# Patient Record
Sex: Female | Born: 1950 | Race: White | Hispanic: No | Marital: Single | Smoking: Never smoker
Health system: Southern US, Community
[De-identification: ages and names within clinical notes are randomized; demographics above are authoritative.]

## PROBLEM LIST (undated history)

## (undated) DIAGNOSIS — G9341 Metabolic encephalopathy: Secondary | ICD-10-CM

## (undated) DIAGNOSIS — I10 Essential (primary) hypertension: Secondary | ICD-10-CM

## (undated) DIAGNOSIS — E785 Hyperlipidemia, unspecified: Secondary | ICD-10-CM

## (undated) DIAGNOSIS — I499 Cardiac arrhythmia, unspecified: Secondary | ICD-10-CM

## (undated) DIAGNOSIS — I219 Acute myocardial infarction, unspecified: Secondary | ICD-10-CM

## (undated) DIAGNOSIS — I5030 Unspecified diastolic (congestive) heart failure: Secondary | ICD-10-CM

## (undated) DIAGNOSIS — R6520 Severe sepsis without septic shock: Secondary | ICD-10-CM

## (undated) DIAGNOSIS — N189 Chronic kidney disease, unspecified: Secondary | ICD-10-CM

## (undated) DIAGNOSIS — Z87442 Personal history of urinary calculi: Secondary | ICD-10-CM

## (undated) DIAGNOSIS — R1310 Dysphagia, unspecified: Secondary | ICD-10-CM

## (undated) DIAGNOSIS — T07XXXA Unspecified multiple injuries, initial encounter: Secondary | ICD-10-CM

## (undated) DIAGNOSIS — I890 Lymphedema, not elsewhere classified: Secondary | ICD-10-CM

## (undated) DIAGNOSIS — E119 Type 2 diabetes mellitus without complications: Secondary | ICD-10-CM

## (undated) DIAGNOSIS — R7881 Bacteremia: Secondary | ICD-10-CM

## (undated) DIAGNOSIS — N179 Acute kidney failure, unspecified: Secondary | ICD-10-CM

## (undated) DIAGNOSIS — G4733 Obstructive sleep apnea (adult) (pediatric): Secondary | ICD-10-CM

## (undated) DIAGNOSIS — A4151 Sepsis due to Escherichia coli [E. coli]: Secondary | ICD-10-CM

## (undated) DIAGNOSIS — I2510 Atherosclerotic heart disease of native coronary artery without angina pectoris: Secondary | ICD-10-CM

## (undated) DIAGNOSIS — Z950 Presence of cardiac pacemaker: Secondary | ICD-10-CM

## (undated) DIAGNOSIS — J9601 Acute respiratory failure with hypoxia: Secondary | ICD-10-CM

## (undated) DIAGNOSIS — I350 Nonrheumatic aortic (valve) stenosis: Secondary | ICD-10-CM

## (undated) HISTORY — DX: Bacteremia: R78.81

## (undated) HISTORY — DX: Lymphedema, not elsewhere classified: I89.0

## (undated) HISTORY — DX: Acute myocardial infarction, unspecified: I21.9

## (undated) HISTORY — PX: PACEMAKER INSERTION: SHX728

## (undated) HISTORY — PX: OTHER SURGICAL HISTORY: SHX169

## (undated) HISTORY — PX: CARDIAC SURGERY: SHX584

---

## 2014-05-29 ENCOUNTER — Encounter: Payer: Self-pay | Admitting: Surgery

## 2014-05-29 ENCOUNTER — Encounter: Payer: Self-pay | Admitting: General Surgery

## 2015-07-13 ENCOUNTER — Encounter: Payer: Medicaid Other | Attending: Surgery | Admitting: Surgery

## 2015-07-13 DIAGNOSIS — Z7982 Long term (current) use of aspirin: Secondary | ICD-10-CM | POA: Diagnosis not present

## 2015-07-13 DIAGNOSIS — Z79899 Other long term (current) drug therapy: Secondary | ICD-10-CM | POA: Diagnosis not present

## 2015-07-13 DIAGNOSIS — Z6841 Body Mass Index (BMI) 40.0 and over, adult: Secondary | ICD-10-CM | POA: Diagnosis not present

## 2015-07-13 DIAGNOSIS — Z794 Long term (current) use of insulin: Secondary | ICD-10-CM | POA: Diagnosis not present

## 2015-07-13 DIAGNOSIS — I350 Nonrheumatic aortic (valve) stenosis: Secondary | ICD-10-CM | POA: Insufficient documentation

## 2015-07-13 DIAGNOSIS — E11620 Type 2 diabetes mellitus with diabetic dermatitis: Secondary | ICD-10-CM | POA: Insufficient documentation

## 2015-07-13 DIAGNOSIS — H5500 Unspecified nystagmus: Secondary | ICD-10-CM | POA: Insufficient documentation

## 2015-07-13 DIAGNOSIS — I10 Essential (primary) hypertension: Secondary | ICD-10-CM | POA: Diagnosis not present

## 2015-07-13 DIAGNOSIS — E785 Hyperlipidemia, unspecified: Secondary | ICD-10-CM | POA: Insufficient documentation

## 2015-07-13 DIAGNOSIS — E11628 Type 2 diabetes mellitus with other skin complications: Secondary | ICD-10-CM | POA: Insufficient documentation

## 2015-07-14 NOTE — Progress Notes (Signed)
ZYKIA, SUTKOWSKI ([id number]) Visit Report for 07/13/2015 Abuse/Suicide Risk Screen Details Patient Name: [name], [name]. Date of Service: 07/13/2015 8:45 AM Medical Record Patient Account Number: [id number] Number: [name], RN, BSN, Treating RN: August 12, 1950 (65 y.o. Velva Harman Date of Birth/Sex: Female) Other Clinician: Primary Care Physician: Benny Lennert Treating Britto, Errol Referring Physician: [name] Physician/Extender: Suella Grove in Treatment: 0 Abuse/Suicide Risk Screen Items Answer ABUSE/SUICIDE RISK SCREEN: Has anyone close to you tried to hurt or harm you recentlyo No Do you feel uncomfortable with anyone in your familyo No Has anyone forced you do things that you didnot want to doo No Do you have any thoughts of harming yourselfo No Patient displays signs or symptoms of abuse and/or neglect. No Electronic Signature(s) Signed: 07/13/2015 5:00:07 PM By: Regan Lemming BSN, RN Entered By: Regan Lemming on 07/13/2015 09:02:51 [name] ([id number]) -------------------------------------------------------------------------------- Activities of Daily Living Details Patient Name: [name], [name]. Date of Service: 07/13/2015 8:45 AM Medical Record Patient Account Number: [id number] Number: [name], RN, BSN, Treating RN: November 25, 1950 (65 y.o. Velva Harman Date of Birth/Sex: Female) Other Clinician: Primary Care Physician: Benny Lennert Treating Christin Fudge Referring Physician: [name] Physician/Extender: Suella Grove in Treatment: 0 Activities of Daily Living Items Answer Activities of Daily Living (Please select one for each item) Drive Automobile Not Able Take Medications Completely Able Use Telephone Completely Able Care for Appearance Completely Able Use Toilet Completely Able Bath / Shower Completely Able Dress Self Completely Able Feed Self Completely Able Walk Completely Able Get In / Out Bed Completely Able Housework Completely Able Prepare Meals  Completely Able Handle Money Completely Able Shop for Self Completely Able Electronic Signature(s) Signed: 07/13/2015 5:00:07 PM By: Regan Lemming BSN, RN Entered By: Regan Lemming on 07/13/2015 09:02:39 [name] ([id number]) -------------------------------------------------------------------------------- Education Assessment Details Patient Name: [name]. Date of Service: 07/13/2015 8:45 AM Medical Record Patient Account Number: [id number] Number: [name], RN, BSN, Treating RN: [birth year] (65 y.o. Velva Harman Date of Birth/Sex: Female) Other Clinician: Primary Care Physician: Benny Lennert Treating Christin Fudge Referring Physician: [name] Physician/Extender: Suella Grove in Treatment: 0 Primary Learner Assessed: Patient Learning Preferences/Education Level/Primary Language Learning Preference: Explanation Highest Education Level: College or Above Preferred Language: English Cognitive Barrier Assessment/Beliefs Language Barrier: No Physical Barrier Assessment Impaired Vision: Yes Glasses Impaired Hearing: No Decreased Hand dexterity: No Knowledge/Comprehension Assessment Knowledge Level: Medium Comprehension Level: Medium Ability to understand written Medium instructions: Ability to understand verbal Medium instructions: Motivation Assessment Anxiety Level: Calm Cooperation: Cooperative Education Importance: Acknowledges Need Interest in Health Problems: Asks Questions Perception: Coherent Willingness to Engage in Self- Medium Management Activities: Readiness to Engage in Self- Medium Management Activities: Electronic Signature(s) Signed: 07/13/2015 5:00:07 PM By: Regan Lemming BSN, RN Entered By: Regan Lemming on 07/13/2015 09:01:38 [name] ([id number] [name] ([id number]) -------------------------------------------------------------------------------- Fall Risk Assessment Details Patient Name: [name]. Date of Service:  07/13/2015 8:45 AM Medical Record Patient Account Number: [id number] Number: [name], RN, BSN, Treating RN: January 31, 1951 (65 y.o. Velva Harman Date of Birth/Sex: Female) Other Clinician: Primary Care Physician: Benny Lennert Treating Britto, Errol Referring Physician: [name] Physician/Extender: Suella Grove in Treatment: 0 Fall Risk Assessment Items Have you had 2 or more falls in the last 12 monthso 0 No Have you had any fall that resulted in injury in the last 12 monthso 0 No FALL RISK ASSESSMENT: History of falling - immediate or within 3 months 0 No Secondary diagnosis 0 No Ambulatory aid None/bed rest/wheelchair/nurse 0 Yes Crutches/cane/walker 0 No Furniture 0  No IV Access/Saline Lock 0 No Gait/Training Normal/bed rest/immobile 0 Yes Weak 0 No Impaired 0 No Mental Status Oriented to own ability 0 Yes Electronic Signature(s) Signed: 07/13/2015 5:00:07 PM By: Regan Lemming BSN, RN Entered By: Regan Lemming on 07/13/2015 09:01:53 [name] ([id number]) -------------------------------------------------------------------------------- Foot Assessment Details Patient Name: [name]. Date of Service: 07/13/2015 8:45 AM Medical Record Patient Account Number: [id number] Number: [name], RN, BSN, Treating RN: January 04, 1951 (65 y.o. Velva Harman Date of Birth/Sex: Female) Other Clinician: Primary Care Physician: Benny Lennert Treating Britto, Errol Referring Physician: [name] Physician/Extender: Suella Grove in Treatment: 0 Foot Assessment Items Site Locations + = Sensation present, - = Sensation absent, C = Callus, U = Ulcer R = Redness, W = Warmth, M = Maceration, PU = Pre-ulcerative lesion F = Fissure, S = Swelling, D = Dryness Assessment Right: Left: Other Deformity: No No Prior Foot Ulcer: No No Prior Amputation: No No Charcot Joint: No No Ambulatory Status: Ambulatory With Help Assistance Device: Cane Gait: Administrator, arts) Signed: 07/13/2015  5:00:07 PM By: Regan Lemming BSN, RN Entered By: Regan Lemming on 07/13/2015 09:02:11 [name] ([id number] [name] ([id number]) -------------------------------------------------------------------------------- Nutrition Risk Assessment Details Patient Name: [name]. Date of Service: 07/13/2015 8:45 AM Medical Record Patient Account Number: [id number] Number: [name], RN, BSN, Treating RN: February 10, 1951 (65 y.o. Velva Harman Date of Birth/Sex: Female) Other Clinician: Primary Care Physician: Benny Lennert Treating Britto, Errol Referring Physician: [name] Physician/Extender: Suella Grove in Treatment: 0 Height (in): 63 Weight (lbs): 320 Body Mass Index (BMI): 56.7 Nutrition Risk Assessment Items NUTRITION RISK SCREEN: I have an illness or condition that made me change the kind and/or 0 No amount of food I eat I eat fewer than two meals per day 0 No I eat few fruits and vegetables, or milk products 0 No I have three or more drinks of beer, liquor or wine almost every day 0 No I have tooth or mouth problems that make it hard for me to eat 0 No I don't always have enough money to buy the food I need 0 No I eat alone most of the time 0 No I take three or more different prescribed or over-the-counter drugs a 0 No day Without wanting to, I have lost or gained 10 pounds in the last six 0 No months I am not always physically able to shop, cook and/or feed myself 0 No Nutrition Protocols Good Risk Protocol 0 No interventions needed Moderate Risk Protocol Electronic Signature(s) Signed: 07/13/2015 5:00:07 PM By: Regan Lemming BSN, RN Entered By: Regan Lemming on 07/13/2015 09:02:01

## 2015-07-14 NOTE — Progress Notes (Signed)
[name], [name] ([id number]) Visit Report for 07/13/2015 Chief Complaint Document Details Patient Name: [name], [name]. Date of Service: 07/13/2015 8:45 AM Medical Record Patient Account Number: [id number] Number: [name], Treating [name]: July 20, 1950 (65 y.o. [name] Date of Birth/Sex: Female) Other Clinician: Primary Care Physician: [name] Treating [name] Referring Physician: Gordy [name] Physician/Extender: [name] in Treatment: 0 Information Obtained from: Patient Chief Complaint Patients presents for treatment of an open diabetic ulcer to her left lower extremity which she's had for about 3 months. Electronic Signature(s) Signed: 07/13/2015 9:48:56 AM By: Miranda Fudge MD, FACS Entered By: [name] on 07/13/2015 09:48:56 [name] ([id number]) -------------------------------------------------------------------------------- HPI Details Patient Name: [name]. Date of Service: 07/13/2015 8:45 AM Medical Record Patient Account Number: [id number] Number: [name], Treating [name]: Oct 26, 1950 (65 y.o. [name] Date of Birth/Sex: Female) Other Clinician: Primary Care Physician: [name] Treating [name] Referring Physician: Gordy [name] Physician/Extender: [name] in Treatment: 0 History of Present Illness Location: left leg on the lower lateral side Quality: Patient reports No Pain. Severity: Patient states wound (s) are getting better. Duration: Patient has had the wound for > 3 months prior to seeking treatment at the wound center Modifying Factors: diabetes,obesity Associated Signs and Symptoms: Very dry skin legs HPI Description: Pleasant 65 year old with history of diabetes (hemoglobin A1c 6.8), aortic stenosis, obesity, and lower extremity swelling. She has had recurrent problems to the left lower extremity where she's had dry scabs and was concerned about ulceration. In the past she was seen for left calf  ulcerations since April 2015, most recently in October 2015. last seen here in December 2015, when she was prescribed juxta light compression wraps and asked to use them on a regular basis. However she has problems bending down and cannot reach her lower leg and has been unable to apply compression stockings. she has never been a smoker. Her other comorbidities include hypertension, hyperlipidemia, obesity, aortic valve stenosis and nystagmus. Electronic Signature(s) Signed: 07/13/2015 9:52:15 AM By: Miranda Fudge MD, FACS Entered By: [name] on 07/13/2015 09:52:15 [name] ([id number]) -------------------------------------------------------------------------------- Physical Exam Details Patient Name: [name]. Date of Service: 07/13/2015 8:45 AM Medical Record Patient Account Number: [id number] Number: [name], Treating [name]: 1951-03-19 (65 y.o. [name] Date of Birth/Sex: Female) Other Clinician: Primary Care Physician: [name] Treating [name] Referring Physician: Gordy [name] Physician/Extender: Weeks in Treatment: 0 Constitutional . Pulse regular. Respirations normal and unlabored. Afebrile. . Eyes Nonicteric. Reactive to light. Ears, Nose, Mouth, and Throat Lips, teeth, and gums WNL.Marland Kitchen Moist mucosa without lesions. Neck supple and nontender. No palpable supraclavicular or cervical adenopathy. Normal sized without goiter. Respiratory WNL. No retractions.. Cardiovascular Pedal Pulses WNL. ABI on the left was 1.11 and the right was 1.17. No clubbing, cyanosis or edema. Gastrointestinal (GI) Abdomen without masses or tenderness.. No liver or spleen enlargement or tenderness.. Lymphatic No adneopathy. No adenopathy. No adenopathy. Musculoskeletal Adexa without tenderness or enlargement.. Digits and nails w/o clubbing, cyanosis, infection, petechiae, ischemia, or inflammatory conditions.. Integumentary (Hair, Skin) No suspicious  lesions. No crepitus or fluctuance. No peri-wound warmth or erythema. No masses.Marland Kitchen Psychiatric Judgement and insight Intact.. No evidence of depression, anxiety, or agitation.. Notes the left lower extremity had a lot of dry scaly scabs but after gently washing this out with saline and applying some lotion the scabs came right off and there was no open ulcerations below this. There is no significant lymphedema or cellulitis. Electronic Signature(s) Signed: 07/13/2015 9:53:09  AM By: Miranda Fudge MD, FACS Entered By: [name] on 07/13/2015 09:53:08 [name] ([id number]) -------------------------------------------------------------------------------- Physician Orders Details Patient Name: [name]. Date of Service: 07/13/2015 8:45 AM Medical Record Patient Account Number: [id number] Number: [name], Treating [name]: [birth year] (65 y.o. [name] Date of Birth/Sex: Female) Other Clinician: Primary Care Physician: [name] Treating [name] Referring Physician: Gordy [name] Physician/Extender: [name] in Treatment: 0 Verbal / Phone Orders: Yes Clinician: [name], [name], [name], Rita Read Back and Verified: Yes Diagnosis Coding Wound Cleansing o Cleanse wound with mild soap and water Skin Barriers/Peri-Wound Care o Moisturizing lotion Discharge From [facility] o Discharge from [place] Completed. Consult only Electronic Signature(s) Signed: 07/13/2015 3:35:32 PM By: Miranda Fudge MD, FACS Signed: 07/13/2015 5:00:07 PM By: Regan Lemming BSN, [name] Entered By: Regan Lemming on 07/13/2015 09:22:04 [name] ([id number]) -------------------------------------------------------------------------------- Problem List Details Patient Name: [name]. Date of Service: 07/13/2015 8:45 AM Medical Record Patient Account Number: [id number] Number: [name], Treating [name]: 1951/04/06 (65 y.o. 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 Date of Service: 07/13/2015 8:45 AM Medical Record Patient Account Number: [id number] Number: [name], Treating [name]: 02-06-1951 (65 y.o. [name] Date of Birth/Sex: Female) Other Clinician: Primary Care Physician: [name] Treating [name] Referring Physician: Gordy [name] Physician/Extender: [name] in Treatment: 0 Subjective Chief Complaint Information obtained from Patient Patients presents for treatment of an open diabetic ulcer to her left lower extremity which she's had for about 3 months. History of Present Illness (HPI) The following HPI elements were documented for the patient's wound: Location: left leg on the lower lateral side Quality: Patient reports No Pain. Severity: Patient states wound (s) are getting better. Duration: Patient has had the wound for > 3 months prior to seeking treatment at the wound center Modifying Factors: diabetes,obesity Associated Signs and Symptoms: Very dry skin legs Pleasant 65 year old with history of diabetes (hemoglobin A1c 6.8), aortic stenosis, obesity, and  lower extremity swelling. She has had recurrent problems to the left lower extremity where she's had dry scabs and was concerned about ulceration. In the past she was seen for left calf ulcerations since April 2015, most recently in October 2015. last seen here in December 2015, when she was prescribed juxta light compression wraps and asked to use them on a regular basis. However she has problems bending down and cannot reach her lower leg and has been unable to apply compression stockings. she has never been a smoker. Her other comorbidities include hypertension, hyperlipidemia, obesity, aortic valve stenosis and nystagmus. Wound History Patient presents with 1 open wound that has been present for approximately 3weeks. Patient has been treating wound in the following manner: gold bond diabetic lotion. The wound has been healed in the past but has re-opened. Laboratory tests have not been performed in the last month. Patient reportedly has  tested positive for an antibiotic resistant organism. Patient experiences the following problems associated with their wounds: swelling. Patient History Information obtained from Patient. Allergies JATAYA, CABALLES ([id number]) Niaspan Extended-Release (Severity: Severe, Reaction: anaphylaxis), tetanus toxoid, adsorbed (Severity: Severe), Bactrim (Severity: Moderate), Januvia (Severity: Moderate), lisinopril (Severity: Moderate) Family History Cancer - Maternal Grandparents, Paternal Grandparents, Mother, Father, Diabetes - Maternal Grandparents, Paternal Grandparents, Mother, Siblings, Heart Disease - Father, Siblings, Hypertension - Siblings, Father, Mother, No family history of Hereditary Spherocytosis, Kidney Disease, Lung Disease, Seizures, Stroke, Thyroid Problems, Tuberculosis. Social History Never smoker, Marital Status - Single, Alcohol Use - Never, Drug Use - No History, Caffeine Use - Never. Medical History Endocrine Patient has history of  Type II Diabetes Denies history of Type I Diabetes Psychiatric Denies history of Anorexia/bulimia, Confinement Anxiety Patient is treated with Insulin, Oral Agents. Blood sugar is tested. Blood sugar results noted at the following times: Breakfast - 128. Medical And Surgical History Notes Cardiovascular "heart valve problem" Review of Systems (ROS) Eyes Complains or has symptoms of Glasses / Contacts. Ear/Nose/Mouth/Throat The patient has no complaints or symptoms. Hematologic/Lymphatic Complains or has symptoms of Bleeding / Clotting Disorders. Respiratory The patient has no complaints or symptoms. Cardiovascular The patient has no complaints or symptoms. Gastrointestinal The patient has no complaints or symptoms. Endocrine The patient has no complaints or symptoms. Genitourinary The patient has no complaints or symptoms. Immunological The patient has no complaints or symptoms. Integumentary (Skin) Complains or has symptoms of Wounds. Musculoskeletal The patient has no complaints or symptoms. MARLIYAH, GLASBY ([id number]) Neurologic The patient has no complaints or symptoms. Psychiatric The patient has no complaints or symptoms. Medications aspirin 81 mg tablet,delayed release oral 1 1 tablet,delayed release (DR/EC) oral metformin 1,000 mg tablet oral 1 1 tablet oral atorvastatin 20 mg tablet oral 1 1 tablet oral losartan 100 mg tablet oral 1 1 tablet oral clonidine HCl 0.1 mg tablet oral 1 1 tablet oral amlodipine 5 mg tablet oral 1 1 tablet oral Lantus Solostar 100 unit/mL (3 mL) subcutaneous insulin pen subcutaneous insulin pen subcutaneous fenofibrate nanocrystallized 48 mg tablet oral 1 1 tablet oral furosemide 20 mg tablet oral 1 1 tablet oral Objective Constitutional Pulse regular. Respirations normal and unlabored. Afebrile. Vitals Time Taken: 9:03 AM, Height: 65 in, Source: Stated, Weight: 301 lbs, Source: Measured, BMI: 50.1, Temperature: 97.6 F, Pulse:  78 bpm, Respiratory Rate: 20 breaths/min, Blood Pressure: 128/76 mmHg. Eyes Nonicteric. Reactive to light. Ears, Nose, Mouth, and Throat Lips, teeth, and gums WNL.Marland Kitchen Moist mucosa without lesions. Neck supple and nontender. No palpable supraclavicular or cervical adenopathy. Normal sized without goiter. Respiratory WNL. No retractions.. Cardiovascular Pedal Pulses WNL. ABI on the left was 1.11 and the right was 1.17. No clubbing, cyanosis or edema. Gastrointestinal (GI) Abdomen without masses or tenderness.. No liver or spleen enlargement or tenderness.Marland Kitchen MILLY, STURKIE ([id number]) Lymphatic No adneopathy. No adenopathy. No adenopathy. Musculoskeletal Adexa without tenderness or enlargement.. Digits and nails w/o clubbing, cyanosis, infection, petechiae, ischemia, or inflammatory conditions.Marland Kitchen Psychiatric Judgement and insight Intact.. No evidence of depression, anxiety, or agitation.. General Notes: the left lower extremity had a lot of dry scaly scabs but after gently washing this out with saline and applying some lotion the scabs came right off and there was no open ulcerations below this. There is no significant lymphedema or cellulitis. Integumentary (Hair, Skin) No suspicious lesions. No crepitus or fluctuance. No peri-wound warmth or erythema. No masses.. Assessment Active Problems ICD-10 E11.628 - Type 2 diabetes mellitus with other  skin complications [id number] - Type 2 diabetes mellitus with diabetic dermatitis E66.01 - Morbid (severe) obesity due to excess calories This morbidly obese diabetic patient who has several social economic problems and cannot get help to wear her compression stockings on a regular basis does not have any open ulcerations of her left lower extremity. She does have some diabetic dermatitis and some scaly skin and we've given her appropriate advice about this. We will try her best to wear her compression stockings which she already has at home and  will come back and see [place] on a when necessary basis. Plan Wound Cleansing: Cleanse wound with mild soap and water Skin Barriers/Peri-Wound Care: SEMIKO, GAMBONE ([id number]) Moisturizing lotion Discharge From [facility] Services: Discharge from [place] Completed. Consult only This morbidly obese diabetic patient who has several social economic problems and cannot get help to wear her compression stockings on a regular basis does not have any open ulcerations of her left lower extremity. She does have some diabetic dermatitis and some scaly skin and we've given her appropriate advice about this. We will try her best to wear her compression stockings which she already has at home and will come back and see [place] on a when necessary basis. Electronic Signature(s) Signed: 07/13/2015 9:54:20 AM By: Miranda Fudge MD, FACS Entered By: [name] on 07/13/2015 09:54:20 [name] ([id number]) -------------------------------------------------------------------------------- ROS/PFSH Details Patient Name: [name]. Date of Service: 07/13/2015 8:45 AM Medical Record Patient Account Number: [id number] Number: [name], Treating [name]: [birth year] (65 y.o. [name] Date of Birth/Sex: Female) Other Clinician: Primary Care Physician: [name] Treating Marlyce Mcdougald Referring Physician: Gordy [name] Physician/Extender: [name] in Treatment: 0 Information Obtained From Patient Wound History Do you currently have one or more open woundso Yes How many open wounds do you currently haveo 1 Approximately how long have you had your woundso 3weeks How have you been treating your wound(s) until nowo gold bond diabetic lotion Has your wound(s) ever healed and then re-openedo Yes Have you had any lab work done in the past montho No Have you had other problems associated with your woundso Swelling Eyes Complaints and Symptoms: Positive for: Glasses /  Contacts Medical History: Positive for: Cataracts Negative for: Glaucoma; Optic Neuritis Hematologic/Lymphatic Complaints and Symptoms: Positive for: Bleeding / Clotting Disorders Medical History: Negative for: Anemia; Hemophilia; Human Immunodeficiency Virus; Lymphedema; Sickle Cell Disease Integumentary (Skin) Complaints and Symptoms: Positive for: Wounds Medical History: Negative for: History of Burn; History of pressure wounds Ear/Nose/Mouth/Throat Complaints and Symptoms: No Complaints or Symptoms IVONE, PAPAGEORGIOU. ([id number]) Medical History: Negative for: Chronic sinus problems/congestion; Middle ear problems Respiratory Complaints and Symptoms: No Complaints or Symptoms Medical History: Negative for: Aspiration; Asthma; Chronic Obstructive Pulmonary Disease (COPD); Pneumothorax; Sleep Apnea; Tuberculosis Cardiovascular Complaints and Symptoms: No Complaints or Symptoms Medical History: Positive for: Hypertension Negative for: Angina; Arrhythmia; Congestive Heart Failure; Coronary Artery Disease; Deep Vein Thrombosis; Hypotension; Myocardial Infarction; Peripheral Arterial Disease; Peripheral Venous Disease; Phlebitis; Vasculitis Past Medical History Notes: "heart valve problem" Gastrointestinal Complaints and Symptoms: No Complaints or Symptoms Medical History: Negative for: Cirrhosis ; Colitis; Crohnos; Hepatitis A; Hepatitis B; Hepatitis C Endocrine Complaints and Symptoms: No Complaints or Symptoms Medical History: Positive for: Type II Diabetes Negative for: Type I Diabetes Time with diabetes: since 2007 Treated with: Insulin, Oral agents Blood sugar tested every day: Yes Tested : daily Blood sugar testing results: Breakfast: 128 Genitourinary Complaints and Symptoms: No Complaints or Symptoms DEVANIE, WAGERS. ([id number])  Medical History: Negative for: End Stage Renal Disease Immunological Complaints and Symptoms: No Complaints or  Symptoms Medical History: Negative for: Lupus Erythematosus; Raynaudos; Scleroderma Musculoskeletal Complaints and Symptoms: No Complaints or Symptoms Medical History: Negative for: Gout; Rheumatoid Arthritis; Osteoarthritis; Osteomyelitis Neurologic Complaints and Symptoms: No Complaints or Symptoms Medical History: Positive for: Neuropathy - fingers and feet Negative for: Dementia; Quadriplegia; Paraplegia; Seizure Disorder Oncologic Medical History: Negative for: Received Chemotherapy; Received Radiation Psychiatric Complaints and Symptoms: No Complaints or Symptoms Medical History: Negative for: Anorexia/bulimia; Confinement Anxiety HBO Extended History Items Eyes: Cataracts Family and Social History Cancer: Yes - Maternal Grandparents, Paternal Grandparents, Mother, Father; Diabetes: Yes - Maternal Grandparents, Paternal Grandparents, Mother, Siblings; Heart Disease: Yes - Father, Siblings; Hereditary Spherocytosis: No; Hypertension: Yes - Siblings, Father, Mother; Kidney Disease: No; Lung Disease: No; Seizures: No; Stroke: No; Thyroid Problems: No; Tuberculosis: No; Never smoker; Marital Status - Single; ZYAH, CHAPA F. ([id number]) Alcohol Use: Never; Drug Use: No History; Caffeine Use: Never; Financial Concerns: Yes; Food, Clothing or Shelter Needs: No; Support System Lacking: No; Transportation Concerns: No; Advanced Directives: No; Patient does not want information on Advanced Directives Physician Affirmation I have reviewed and agree with the above information. Electronic Signature(s) Signed: 07/13/2015 9:27:07 AM By: Miranda Fudge MD, FACS Signed: 07/13/2015 5:00:07 PM By: Regan Lemming BSN, [name] Entered By: [name] on 07/13/2015 09:27:07 [name] ([id number]) -------------------------------------------------------------------------------- SuperBill Details Patient Name: [name]. Date of Service: 07/13/2015 Medical Record Patient Account Number:  [id number] Number: [name], Treating [name]: Aug 14, 1950 (65 y.o. 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 [id number] Type 2 diabetes mellitus with diabetic dermatitis E66.01 Morbid (severe) obesity due to excess calories Facility Procedures CPT4 Code: [id number] Description: 99213 - WOUND CARE VISIT-LEV 3 EST PT Modifier: Quantity: 1 Physician Procedures CPT4 Code: [id number] Description: [id number] - WC PHYS LEVEL 3 - EST PT ICD-10 Description Diagnosis E11.628 Type 2 diabetes mellitus with other skin compli E11.620 Type 2 diabetes mellitus with diabetic dermatit E66.01 Morbid (severe) obesity due to excess calories Modifier: cations is Quantity: 1 Electronic Signature(s) Signed: 07/13/2015 9:54:32 AM By: Miranda Fudge MD, FACS Entered By: [name] on 07/13/2015 09:54:32

## 2015-07-14 NOTE — Progress Notes (Signed)
GARLA, GASSERT ([id number]) Visit Report for 07/13/2015 Allergy List Details Patient Name: [name], [name]. Date of Service: 07/13/2015 8:45 AM Medical Record Patient Account Number: [id number] Number: [name], RN, BSN, Treating RN: 09-29-1950 (65 y.o. [name] Date of Birth/Sex: Female) Other Clinician: Primary Care Physician: Benny Lennert Treating Christin Fudge Referring Physician: [name] Physician/Extender: Suella Grove in Treatment: 0 Allergies Active Allergies Niaspan Extended-Release Reaction: anaphylaxis Severity: Severe tetanus toxoid, adsorbed Severity: Severe Bactrim Severity: Moderate Januvia Severity: Moderate lisinopril Severity: Moderate Allergy Notes Electronic Signature(s) Signed: 07/13/2015 5:00:07 PM By: Regan Lemming BSN, RN Entered By: Regan Lemming on 07/13/2015 08:59:19 [name] ([id number]) -------------------------------------------------------------------------------- Arrival Information Details Patient Name: [name]. Date of Service: 07/13/2015 8:45 AM Medical Record Patient Account Number: [id number] Number: [name], RN, BSN, Treating RN: 1951-02-13 (65 y.o. [name] Date of Birth/Sex: Female) Other Clinician: Primary Care Physician: Benny Lennert Treating Britto, Errol Referring Physician: [name] Physician/Extender: Suella Grove in Treatment: 0 Visit Information Patient Arrived: Cane Arrival Time: 08:57 Accompanied By: self Transfer Assistance: None Patient Identification Verified: Yes Secondary Verification Process Yes Completed: Patient Requires Transmission- No Based Precautions: Patient Has Alerts: Yes Patient Alerts: Patient on Blood Thinner History Since Last Visit Added or deleted any medications: No Any new allergies or adverse reactions: No Had a fall or experienced change in activities of daily living that may affect risk of falls: No Signs or symptoms of abuse/neglect since last visito No Hospitalized  since last visit: No Has Dressing in Place as Prescribed: Yes Electronic Signature(s) Signed: 07/13/2015 5:00:07 PM By: Regan Lemming BSN, RN Entered By: Regan Lemming on 07/13/2015 08:59:13 [name] ([id number]) -------------------------------------------------------------------------------- Clinic Level of Care Assessment Details Patient Name: [name]. Date of Service: 07/13/2015 8:45 AM Medical Record Patient Account Number: [id number] Number: [name], RN, BSN, Treating RN: September 12, 1950 (65 y.o. [name] Date of Birth/Sex: Female) Other Clinician: Primary Care Physician: Benny Lennert Treating Christin Fudge Referring Physician: [name] Physician/Extender: Suella Grove in Treatment: 0 Clinic Level of Care Assessment Items TOOL 1 Quantity Score []  - Use when EandM and Procedure is performed on INITIAL visit 0 ASSESSMENTS - Nursing Assessment / Reassessment X - General Physical Exam (combine w/ comprehensive assessment (listed just 1 20 below) when performed on new pt. evals) X - Comprehensive Assessment (HX, ROS, Risk Assessments, Wounds Hx, etc.) 1 25 ASSESSMENTS - Wound and Skin Assessment / Reassessment []  - Dermatologic / Skin Assessment (not related to wound area) 0 ASSESSMENTS - Ostomy and/or Continence Assessment and Care []  - Incontinence Assessment and Management 0 []  - Ostomy Care Assessment and Management (repouching, etc.) 0 PROCESS - Coordination of Care X - Simple Patient / Family Education for ongoing care 1 15 []  - Complex (extensive) Patient / Family Education for ongoing care 0 []  - Staff obtains Programmer, systems, Records, Test Results / Process Orders 0 []  - Staff telephones HHA, Nursing Homes / Clarify orders / etc 0 []  - Routine Transfer to another Facility (non-emergent condition) 0 []  - Routine Hospital Admission (non-emergent condition) 0 X - New Admissions / Biomedical engineer / Ordering NPWT, Apligraf, etc. 1 15 []  - Emergency Hospital  Admission (emergent condition) 0 PROCESS - Special Needs []  - Pediatric / Minor Patient Management 0 KEEGHAN, ANHORN. ([id number]) []  - Isolation Patient Management 0 []  - Hearing / Language / Visual special needs 0 []  - Assessment of Community assistance (transportation, D/C planning, etc.) 0 []  - Additional assistance / Altered mentation 0 []  - Support Surface(s) Assessment (bed, cushion,  seat, etc.) 0 INTERVENTIONS - Miscellaneous []  - External ear exam 0 []  - Patient Transfer (multiple staff / Harrel Lemon Lift / Similar devices) 0 []  - Simple Staple / Suture removal (25 or less) 0 []  - Complex Staple / Suture removal (26 or more) 0 []  - Hypo/Hyperglycemic Management (do not check if billed separately) 0 X - Ankle / Brachial Index (ABI) - do not check if billed separately 1 15 Has the patient been seen at the hospital within the last three years: Yes Total Score: 90 Level Of Care: New/Established - Level 3 Electronic Signature(s) Signed: 07/13/2015 5:00:07 PM By: Regan Lemming BSN, RN Entered By: Regan Lemming on 07/13/2015 09:24:10 [name] ([id number]) -------------------------------------------------------------------------------- Encounter Discharge Information Details Patient Name: [name]. Date of Service: 07/13/2015 8:45 AM Medical Record Patient Account Number: [id number] Number: [name], RN, BSN, Treating RN: 08/30/1950 (65 y.o. [name] Date of Birth/Sex: Female) Other Clinician: Primary Care Physician: Benny Lennert Treating Christin Fudge Referring Physician: [name] Physician/Extender: Suella Grove in Treatment: 0 Encounter Discharge Information Items Discharge Pain Level: 0 Discharge Condition: Stable Ambulatory Status: Cane Discharge Destination: Home Private Transportation: Auto Accompanied By: self Schedule Follow-up Appointment: No Medication Reconciliation completed and No provided to Patient/Care Clinten Howk: Clinical Summary of  Care: Electronic Signature(s) Signed: 07/13/2015 5:00:07 PM By: Regan Lemming BSN, RN Entered By: Regan Lemming on 07/13/2015 09:24:40 [name] ([id number]) -------------------------------------------------------------------------------- General Visit Notes Details Patient Name: [name]. Date of Service: 07/13/2015 8:45 AM Medical Record Patient Account Number: [id number] Number: [name], RN, BSN, Treating RN: [birth year] (65 y.o. [name] Date of Birth/Sex: Female) Other Clinician: Primary Care Physician: Benny Lennert Treating Christin Fudge Referring Physician: [name] Physician/Extender: Suella Grove in Treatment: 0 Notes Legs washed and moisturizing lotion applied. Electronic Signature(s) Signed: 07/13/2015 5:00:07 PM By: Regan Lemming BSN, RN Entered By: Regan Lemming on 07/13/2015 09:25:18 [name] ([id number]) -------------------------------------------------------------------------------- Lower Extremity Assessment Details Patient Name: [name]. Date of Service: 07/13/2015 8:45 AM Medical Record Patient Account Number: [id number] Number: [name], RN, BSN, Treating RN: February 01, 1951 (65 y.o. [name] Date of Birth/Sex: Female) Other Clinician: Primary Care Physician: Benny Lennert Treating Britto, Errol Referring Physician: [name] Physician/Extender: Suella Grove in Treatment: 0 Vascular Assessment Pulses: Posterior Tibial Palpable: [Left:No] [Right:Yes] Dorsalis Pedis Palpable: [Left:Yes] [Right:Yes] Doppler: [Left:Multiphasic] [Right:Multiphasic] Extremity colors, hair growth, and conditions: Extremity Color: [Left:Normal] [Right:Normal] Hair Growth on Extremity: [Left:No] [Right:No] Temperature of Extremity: [Left:Warm] [Right:Warm] Capillary Refill: [Left:< 3 seconds] [Right:< 3 seconds] Dependent Rubor: [Left:No] [Right:No] Blanched when Elevated: [Left:No] [Right:No] Lipodermatosclerosis: [Left:No] [Right:No] Blood  Pressure: Brachial: [Left:128] [Right:125] Dorsalis Pedis: 142 [Left:Dorsalis Pedis: [id number] Ankle: Posterior Tibial: 142 [Left:Posterior Tibial: 150 1.11] [Right:1.17] Toe Nail Assessment Left: Right: Thick: No No Discolored: No No Deformed: No No Improper Length and Hygiene: No No Electronic Signature(s) Signed: 07/13/2015 5:00:07 PM By: Regan Lemming BSN, RN Entered By: Regan Lemming on 07/13/2015 09:13:31 [name] ([id number]) -------------------------------------------------------------------------------- Pain Assessment Details Patient Name: [name]. Date of Service: 07/13/2015 8:45 AM Medical Record Patient Account Number: [id number] Number: [name], RN, BSN, Treating RN: August 04, 1950 (65 y.o. [name] Date of Birth/Sex: Female) Other Clinician: Primary Care Physician: Benny Lennert Treating Christin Fudge Referring Physician: [name] Physician/Extender: Suella Grove in Treatment: 0 Active Problems Location of Pain Severity and Description of Pain Patient Has Paino No Site Locations Pain Management and Medication Current Pain Management: Electronic Signature(s) Signed: 07/13/2015 5:00:07 PM By: Regan Lemming BSN, RN Entered By: Regan Lemming on 07/13/2015 09:03:19 [name] ([id number]) --------------------------------------------------------------------------------  Patient/Caregiver Education Details Patient Name: EMALYN, BOGACZ. Date of Service: 07/13/2015 8:45 AM Medical Record Patient Account Number: [id number] Number: [name], RN, BSN, Treating RN: [birth year] (65 y.o. [name] Date of Birth/Gender: Female) Other Clinician: Primary Care Physician: Benny Lennert Treating Christin Fudge Referring Physician: [name] Physician/Extender: Suella Grove in Treatment: 0 Education Assessment Education Provided To: Patient Education Topics Provided Wound/Skin Impairment: Methods: Explain/Verbal Responses: State content correctly Electronic  Signature(s) Signed: 07/13/2015 5:00:07 PM By: Regan Lemming BSN, RN Entered By: Regan Lemming on 07/13/2015 09:24:48 [name] ([id number]) -------------------------------------------------------------------------------- Vitals Details Patient Name: [name]. Date of Service: 07/13/2015 8:45 AM Medical Record Patient Account Number: [id number] Number: [name], RN, BSN, Treating RN: Dec 13, 1950 (65 y.o. [name] Date of Birth/Sex: Female) Other Clinician: Primary Care Physician: Benny Lennert Treating Britto, Errol Referring Physician: [name] Physician/Extender: Suella Grove in Treatment: 0 Vital Signs Time Taken: 09:03 Temperature (F): 97.6 Height (in): 65 Pulse (bpm): 78 Source: Stated Respiratory Rate (breaths/min): 20 Weight (lbs): 301 Blood Pressure (mmHg): 128/76 Source: Measured Reference Range: 80 - 120 mg / dl Body Mass Index (BMI): 50.1 Electronic Signature(s) Signed: 07/13/2015 5:00:07 PM By: Regan Lemming BSN, RN Entered By: Regan Lemming on 07/13/2015 09:05:50

## 2016-05-02 ENCOUNTER — Emergency Department: Payer: Medicare Other

## 2016-05-02 ENCOUNTER — Inpatient Hospital Stay
Admit: 2016-05-02 | Discharge: 2016-05-02 | Disposition: A | Discharge status: Home / Self Care | Payer: Medicare Other | Attending: Internal Medicine | Admitting: Internal Medicine

## 2016-05-02 ENCOUNTER — Inpatient Hospital Stay
Admission: EM | Admit: 2016-05-02 | Discharge: 2016-05-09 | DRG: 871 | Disposition: A | Discharge status: Skilled Nursing Facility | Payer: Medicare Other | Attending: Internal Medicine | Admitting: Internal Medicine

## 2016-05-02 ENCOUNTER — Encounter: Payer: Self-pay | Admitting: Emergency Medicine

## 2016-05-02 ENCOUNTER — Inpatient Hospital Stay: Payer: Medicare Other

## 2016-05-02 DIAGNOSIS — I10 Essential (primary) hypertension: Secondary | ICD-10-CM | POA: Diagnosis present

## 2016-05-02 DIAGNOSIS — Z794 Long term (current) use of insulin: Secondary | ICD-10-CM

## 2016-05-02 DIAGNOSIS — G9341 Metabolic encephalopathy: Secondary | ICD-10-CM | POA: Diagnosis present

## 2016-05-02 DIAGNOSIS — Z66 Do not resuscitate: Secondary | ICD-10-CM | POA: Diagnosis present

## 2016-05-02 DIAGNOSIS — L03115 Cellulitis of right lower limb: Secondary | ICD-10-CM | POA: Diagnosis present

## 2016-05-02 DIAGNOSIS — L8990 Pressure ulcer of unspecified site, unspecified stage: Secondary | ICD-10-CM | POA: Insufficient documentation

## 2016-05-02 DIAGNOSIS — I872 Venous insufficiency (chronic) (peripheral): Secondary | ICD-10-CM | POA: Diagnosis present

## 2016-05-02 DIAGNOSIS — E662 Morbid (severe) obesity with alveolar hypoventilation: Secondary | ICD-10-CM | POA: Diagnosis present

## 2016-05-02 DIAGNOSIS — N390 Urinary tract infection, site not specified: Secondary | ICD-10-CM | POA: Diagnosis present

## 2016-05-02 DIAGNOSIS — E785 Hyperlipidemia, unspecified: Secondary | ICD-10-CM | POA: Diagnosis present

## 2016-05-02 DIAGNOSIS — J9602 Acute respiratory failure with hypercapnia: Secondary | ICD-10-CM

## 2016-05-02 DIAGNOSIS — Z23 Encounter for immunization: Secondary | ICD-10-CM | POA: Diagnosis present

## 2016-05-02 DIAGNOSIS — E119 Type 2 diabetes mellitus without complications: Secondary | ICD-10-CM | POA: Diagnosis present

## 2016-05-02 DIAGNOSIS — I472 Ventricular tachycardia: Secondary | ICD-10-CM | POA: Diagnosis not present

## 2016-05-02 DIAGNOSIS — J810 Acute pulmonary edema: Secondary | ICD-10-CM | POA: Diagnosis present

## 2016-05-02 DIAGNOSIS — R262 Difficulty in walking, not elsewhere classified: Secondary | ICD-10-CM

## 2016-05-02 DIAGNOSIS — Z887 Allergy status to serum and vaccine status: Secondary | ICD-10-CM

## 2016-05-02 DIAGNOSIS — E874 Mixed disorder of acid-base balance: Secondary | ICD-10-CM | POA: Diagnosis present

## 2016-05-02 DIAGNOSIS — G4733 Obstructive sleep apnea (adult) (pediatric): Secondary | ICD-10-CM | POA: Diagnosis not present

## 2016-05-02 DIAGNOSIS — Z6841 Body Mass Index (BMI) 40.0 and over, adult: Secondary | ICD-10-CM

## 2016-05-02 DIAGNOSIS — M6281 Muscle weakness (generalized): Secondary | ICD-10-CM

## 2016-05-02 DIAGNOSIS — I272 Pulmonary hypertension, unspecified: Secondary | ICD-10-CM | POA: Diagnosis present

## 2016-05-02 DIAGNOSIS — I890 Lymphedema, not elsewhere classified: Secondary | ICD-10-CM | POA: Diagnosis present

## 2016-05-02 DIAGNOSIS — I350 Nonrheumatic aortic (valve) stenosis: Secondary | ICD-10-CM | POA: Diagnosis present

## 2016-05-02 DIAGNOSIS — L03116 Cellulitis of left lower limb: Secondary | ICD-10-CM | POA: Diagnosis present

## 2016-05-02 DIAGNOSIS — B951 Streptococcus, group B, as the cause of diseases classified elsewhere: Secondary | ICD-10-CM | POA: Diagnosis present

## 2016-05-02 DIAGNOSIS — Z79899 Other long term (current) drug therapy: Secondary | ICD-10-CM

## 2016-05-02 DIAGNOSIS — J9601 Acute respiratory failure with hypoxia: Secondary | ICD-10-CM | POA: Diagnosis present

## 2016-05-02 DIAGNOSIS — I214 Non-ST elevation (NSTEMI) myocardial infarction: Secondary | ICD-10-CM | POA: Diagnosis present

## 2016-05-02 DIAGNOSIS — B9620 Unspecified Escherichia coli [E. coli] as the cause of diseases classified elsewhere: Secondary | ICD-10-CM | POA: Diagnosis present

## 2016-05-02 DIAGNOSIS — I5033 Acute on chronic diastolic (congestive) heart failure: Secondary | ICD-10-CM | POA: Diagnosis present

## 2016-05-02 DIAGNOSIS — E872 Acidosis, unspecified: Secondary | ICD-10-CM | POA: Insufficient documentation

## 2016-05-02 DIAGNOSIS — I447 Left bundle-branch block, unspecified: Secondary | ICD-10-CM | POA: Diagnosis present

## 2016-05-02 DIAGNOSIS — A419 Sepsis, unspecified organism: Secondary | ICD-10-CM | POA: Diagnosis present

## 2016-05-02 DIAGNOSIS — N179 Acute kidney failure, unspecified: Secondary | ICD-10-CM | POA: Diagnosis present

## 2016-05-02 DIAGNOSIS — Z9989 Dependence on other enabling machines and devices: Secondary | ICD-10-CM

## 2016-05-02 DIAGNOSIS — Z888 Allergy status to other drugs, medicaments and biological substances status: Secondary | ICD-10-CM

## 2016-05-02 DIAGNOSIS — Z882 Allergy status to sulfonamides status: Secondary | ICD-10-CM

## 2016-05-02 DIAGNOSIS — J189 Pneumonia, unspecified organism: Secondary | ICD-10-CM | POA: Diagnosis present

## 2016-05-02 DIAGNOSIS — R0602 Shortness of breath: Secondary | ICD-10-CM | POA: Diagnosis present

## 2016-05-02 HISTORY — DX: Essential (primary) hypertension: I10

## 2016-05-02 HISTORY — DX: Type 2 diabetes mellitus without complications: E11.9

## 2016-05-02 LAB — BLOOD GAS, ARTERIAL
ACID-BASE DEFICIT: 3.6 mmol/L — AB (ref 0.0–2.0)
ACID-BASE DEFICIT: 0.2 mmol/L (ref 0.0–2.0)
ALLENS TEST (PASS/FAIL): POSITIVE — AB
Acid-base deficit: 7.4 mmol/L — ABNORMAL HIGH (ref 0.0–2.0)
Allens test (pass/fail): POSITIVE — AB
BICARBONATE: 25 mmol/L (ref 20.0–28.0)
Bicarbonate: 22.1 mmol/L (ref 20.0–28.0)
Bicarbonate: 24.8 mmol/L (ref 20.0–28.0)
FIO2: 1
FIO2: 1
FIO2: 35
LHR: 16 {breaths}/min
MECHVT: 450 mL
MECHVT: 450 mL
MECHVT: 450 mL
O2 SAT: 87 %
O2 SAT: 91.5 %
O2 Saturation: 92.7 %
PATIENT TEMPERATURE: 37
PATIENT TEMPERATURE: 37
PATIENT TEMPERATURE: 37
PCO2 ART: 62 mmHg — AB (ref 32.0–48.0)
PCO2 ART: 41 mmHg (ref 32.0–48.0)
PEEP/CPAP: 8 cmH2O
PEEP/CPAP: 5 cmH2O
PEEP: 5 cmH2O
PH ART: 7.22 — AB (ref 7.350–7.450)
PO2 ART: 68 mmHg — AB (ref 83.0–108.0)
PO2 ART: 79 mmHg — AB (ref 83.0–108.0)
PO2 ART: 63 mmHg — AB (ref 83.0–108.0)
RATE: 16 resp/min
RATE: 16 resp/min
pCO2 arterial: 61 mmHg — ABNORMAL HIGH (ref 32.0–48.0)
pH, Arterial: 7.16 — CL (ref 7.350–7.450)
pH, Arterial: 7.39 (ref 7.350–7.450)

## 2016-05-02 LAB — BLOOD CULTURE ID PANEL (REFLEXED)
Acinetobacter baumannii: NOT DETECTED
Candida albicans: NOT DETECTED
Candida glabrata: NOT DETECTED
Candida krusei: NOT DETECTED
Candida parapsilosis: NOT DETECTED
Candida tropicalis: NOT DETECTED
ENTEROBACTER CLOACAE COMPLEX: NOT DETECTED
Enterobacteriaceae species: NOT DETECTED
Enterococcus species: NOT DETECTED
Escherichia coli: NOT DETECTED
Haemophilus influenzae: NOT DETECTED
Klebsiella oxytoca: NOT DETECTED
Klebsiella pneumoniae: NOT DETECTED
Listeria monocytogenes: NOT DETECTED
NEISSERIA MENINGITIDIS: NOT DETECTED
PROTEUS SPECIES: NOT DETECTED
PSEUDOMONAS AERUGINOSA: NOT DETECTED
SERRATIA MARCESCENS: NOT DETECTED
STAPHYLOCOCCUS AUREUS BCID: NOT DETECTED
STAPHYLOCOCCUS SPECIES: NOT DETECTED
STREPTOCOCCUS AGALACTIAE: DETECTED — AB
STREPTOCOCCUS PNEUMONIAE: NOT DETECTED
STREPTOCOCCUS SPECIES: DETECTED — AB
Streptococcus pyogenes: NOT DETECTED

## 2016-05-02 LAB — CBC WITH DIFFERENTIAL/PLATELET
BASOS ABS: 0.1 10*3/uL (ref 0–0.1)
Basophils Relative: 0 %
EOS PCT: 0 %
Eosinophils Absolute: 0.1 10*3/uL (ref 0–0.7)
HEMATOCRIT: 38.8 % (ref 35.0–47.0)
Hemoglobin: 12.5 g/dL (ref 12.0–16.0)
LYMPHS PCT: 5 %
Lymphs Abs: 1.3 10*3/uL (ref 1.0–3.6)
MCH: 27.4 pg (ref 26.0–34.0)
MCHC: 32.1 g/dL (ref 32.0–36.0)
MCV: 85.2 fL (ref 80.0–100.0)
MONO ABS: 0.8 10*3/uL (ref 0.2–0.9)
MONOS PCT: 3 %
NEUTROS ABS: 27.2 10*3/uL — AB (ref 1.4–6.5)
Neutrophils Relative %: 92 %
PLATELETS: 333 10*3/uL (ref 150–440)
RBC: 4.56 MIL/uL (ref 3.80–5.20)
RDW: 15.6 % — AB (ref 11.5–14.5)
WBC: 29.6 10*3/uL — ABNORMAL HIGH (ref 3.6–11.0)

## 2016-05-02 LAB — MAGNESIUM: MAGNESIUM: 1.8 mg/dL (ref 1.7–2.4)

## 2016-05-02 LAB — COMPREHENSIVE METABOLIC PANEL
ALK PHOS: 81 U/L (ref 38–126)
ALT: 17 U/L (ref 14–54)
ALT: 27 U/L (ref 14–54)
ANION GAP: 6 (ref 5–15)
AST: 33 U/L (ref 15–41)
AST: 147 U/L — ABNORMAL HIGH (ref 15–41)
Albumin: 3.7 g/dL (ref 3.5–5.0)
Albumin: 3.2 g/dL — ABNORMAL LOW (ref 3.5–5.0)
Alkaline Phosphatase: 65 U/L (ref 38–126)
Anion gap: 17 — ABNORMAL HIGH (ref 5–15)
BILIRUBIN TOTAL: 0.7 mg/dL (ref 0.3–1.2)
BUN: 33 mg/dL — ABNORMAL HIGH (ref 6–20)
BUN: 35 mg/dL — ABNORMAL HIGH (ref 6–20)
CALCIUM: 8.7 mg/dL — AB (ref 8.9–10.3)
CALCIUM: 8 mg/dL — AB (ref 8.9–10.3)
CHLORIDE: 105 mmol/L (ref 101–111)
CO2: 17 mmol/L — AB (ref 22–32)
CO2: 27 mmol/L (ref 22–32)
CREATININE: 1.36 mg/dL — AB (ref 0.44–1.00)
CREATININE: 1.49 mg/dL — AB (ref 0.44–1.00)
Chloride: 103 mmol/L (ref 101–111)
GFR calc non Af Amer: 40 mL/min — ABNORMAL LOW (ref >60)
GFR, EST AFRICAN AMERICAN: 46 mL/min — AB (ref >60)
GFR, EST AFRICAN AMERICAN: 41 mL/min — AB (ref >60)
GFR, EST NON AFRICAN AMERICAN: 36 mL/min — AB (ref >60)
Glucose, Bld: 402 mg/dL — ABNORMAL HIGH (ref 65–99)
Glucose, Bld: 220 mg/dL — ABNORMAL HIGH (ref 65–99)
Potassium: 4.2 mmol/L (ref 3.5–5.1)
Potassium: 4.3 mmol/L (ref 3.5–5.1)
SODIUM: 137 mmol/L (ref 135–145)
SODIUM: 138 mmol/L (ref 135–145)
TOTAL PROTEIN: 7.6 g/dL (ref 6.5–8.1)
Total Bilirubin: 0.8 mg/dL (ref 0.3–1.2)
Total Protein: 6.4 g/dL — ABNORMAL LOW (ref 6.5–8.1)

## 2016-05-02 LAB — PHOSPHORUS: PHOSPHORUS: 3.4 mg/dL (ref 2.5–4.6)

## 2016-05-02 LAB — URINALYSIS COMPLETE WITH MICROSCOPIC (ARMC ONLY)
BACTERIA UA: NONE SEEN
Bilirubin Urine: NEGATIVE
Glucose, UA: 150 mg/dL — AB
Ketones, ur: NEGATIVE mg/dL
Nitrite: NEGATIVE
PH: 5 (ref 5.0–8.0)
PROTEIN: 100 mg/dL — AB
Specific Gravity, Urine: 1.011 (ref 1.005–1.030)

## 2016-05-02 LAB — LACTIC ACID, PLASMA
LACTIC ACID, VENOUS: 2.7 mmol/L — AB (ref 0.5–1.9)
Lactic Acid, Venous: 7.1 mmol/L (ref 0.5–1.9)
Lactic Acid, Venous: 2 mmol/L (ref 0.5–1.9)

## 2016-05-02 LAB — TROPONIN I
TROPONIN I: 9.38 ng/mL — AB (ref <0.03)
Troponin I: 0.14 ng/mL (ref <0.03)
Troponin I: 55.29 ng/mL (ref <0.03)

## 2016-05-02 LAB — BRAIN NATRIURETIC PEPTIDE: B Natriuretic Peptide: 286 pg/mL — ABNORMAL HIGH (ref 0.0–100.0)

## 2016-05-02 LAB — PROCALCITONIN: PROCALCITONIN: 20.11 ng/mL

## 2016-05-02 LAB — GLUCOSE, CAPILLARY
GLUCOSE-CAPILLARY: 179 mg/dL — AB (ref 65–99)
GLUCOSE-CAPILLARY: 212 mg/dL — AB (ref 65–99)
Glucose-Capillary: 220 mg/dL — ABNORMAL HIGH (ref 65–99)
Glucose-Capillary: 228 mg/dL — ABNORMAL HIGH (ref 65–99)
Glucose-Capillary: 240 mg/dL — ABNORMAL HIGH (ref 65–99)

## 2016-05-02 LAB — STREP PNEUMONIAE URINARY ANTIGEN: Strep Pneumo Urinary Antigen: NEGATIVE

## 2016-05-02 LAB — MRSA PCR SCREENING: MRSA BY PCR: NEGATIVE

## 2016-05-02 LAB — TRIGLYCERIDES: Triglycerides: 87 mg/dL (ref <150)

## 2016-05-02 MED ORDER — LORAZEPAM 2 MG/ML IJ SOLN
1.0000 mg | Freq: Once | INTRAMUSCULAR | Status: AC
  Administered 2016-05-02: 1 mg via INTRAVENOUS

## 2016-05-02 MED ORDER — CHLORHEXIDINE GLUCONATE 0.12% ORAL RINSE (MEDLINE KIT)
15.0000 mL | Freq: Two times a day (BID) | OROMUCOSAL | Status: DC
  Administered 2016-05-02 – 2016-05-03 (×2): 15 mL via OROMUCOSAL

## 2016-05-02 MED ORDER — MIDAZOLAM HCL 2 MG/2ML IJ SOLN
2.0000 mg | Freq: Once | INTRAMUSCULAR | Status: AC
  Administered 2016-05-02: 2 mg via INTRAVENOUS

## 2016-05-02 MED ORDER — ORAL CARE MOUTH RINSE
15.0000 mL | Freq: Four times a day (QID) | OROMUCOSAL | Status: DC
  Administered 2016-05-03 (×2): 15 mL via OROMUCOSAL

## 2016-05-02 MED ORDER — ONDANSETRON HCL 4 MG/2ML IJ SOLN: 4.0000 mg | Freq: Four times a day (QID) | INTRAMUSCULAR | Status: DC | PRN

## 2016-05-02 MED ORDER — CEFEPIME-DEXTROSE 2 GM/50ML IV SOLR
2.0000 g | Freq: Two times a day (BID) | INTRAVENOUS | Status: DC
  Administered 2016-05-02: 2 g via INTRAVENOUS
  Filled 2016-05-02 (×2): qty 50

## 2016-05-02 MED ORDER — VECURONIUM BROMIDE 10 MG IV SOLR
10.0000 mg | Freq: Once | INTRAVENOUS | Status: AC
  Administered 2016-05-02: 10 mg via INTRAVENOUS
  Filled 2016-05-02: qty 10

## 2016-05-02 MED ORDER — FUROSEMIDE 10 MG/ML IJ SOLN
80.0000 mg | Freq: Once | INTRAMUSCULAR | Status: AC
  Administered 2016-05-02: 80 mg via INTRAVENOUS
  Filled 2016-05-02: qty 8

## 2016-05-02 MED ORDER — PIPERACILLIN-TAZOBACTAM 3.375 G IVPB
3.3750 g | Freq: Once | INTRAVENOUS | Status: AC
  Administered 2016-05-02: 3.375 g via INTRAVENOUS
  Filled 2016-05-02: qty 50

## 2016-05-02 MED ORDER — PROPOFOL 1000 MG/100ML IV EMUL
INTRAVENOUS | Status: AC
  Filled 2016-05-02: qty 100

## 2016-05-02 MED ORDER — NITROGLYCERIN IN D5W 200-5 MCG/ML-% IV SOLN
0.0000 ug/min | Freq: Once | INTRAVENOUS | Status: AC
  Administered 2016-05-02: 10 ug/min via INTRAVENOUS

## 2016-05-02 MED ORDER — ETOMIDATE 2 MG/ML IV SOLN
20.0000 mg | Freq: Once | INTRAVENOUS | Status: AC
  Administered 2016-05-02: 20 mg via INTRAVENOUS

## 2016-05-02 MED ORDER — LORAZEPAM 2 MG/ML IJ SOLN: 2.0000 mg | Freq: Once | INTRAMUSCULAR | Status: DC

## 2016-05-02 MED ORDER — SUCCINYLCHOLINE CHLORIDE 20 MG/ML IJ SOLN
100.0000 mg | Freq: Once | INTRAMUSCULAR | Status: AC
  Administered 2016-05-02: 100 mg via INTRAVENOUS

## 2016-05-02 MED ORDER — FAMOTIDINE IN NACL 20-0.9 MG/50ML-% IV SOLN
20.0000 mg | Freq: Two times a day (BID) | INTRAVENOUS | Status: DC
  Administered 2016-05-02 – 2016-05-03 (×3): 20 mg via INTRAVENOUS
  Filled 2016-05-02 (×4): qty 50

## 2016-05-02 MED ORDER — ACETAMINOPHEN 325 MG PO TABS
650.0000 mg | ORAL_TABLET | ORAL | Status: DC | PRN
  Administered 2016-05-03 – 2016-05-07 (×3): 650 mg via ORAL
  Filled 2016-05-02 (×2): qty 2

## 2016-05-02 MED ORDER — ENOXAPARIN SODIUM 40 MG/0.4ML ~~LOC~~ SOLN
40.0000 mg | Freq: Two times a day (BID) | SUBCUTANEOUS | Status: DC
  Administered 2016-05-02: 40 mg via SUBCUTANEOUS
  Filled 2016-05-02: qty 0.4

## 2016-05-02 MED ORDER — PROPOFOL 1000 MG/100ML IV EMUL
5.0000 ug/kg/min | INTRAVENOUS | Status: DC
  Administered 2016-05-02: 40 ug/kg/min via INTRAVENOUS
  Administered 2016-05-02: 10 ug/kg/min via INTRAVENOUS

## 2016-05-02 MED ORDER — VANCOMYCIN HCL IN DEXTROSE 1-5 GM/200ML-% IV SOLN
1000.0000 mg | Freq: Two times a day (BID) | INTRAVENOUS | Status: DC
  Administered 2016-05-02: 1000 mg via INTRAVENOUS
  Filled 2016-05-02 (×2): qty 200

## 2016-05-02 MED ORDER — PROPOFOL 1000 MG/100ML IV EMUL
5.0000 ug/kg/min | INTRAVENOUS | Status: DC
  Administered 2016-05-02: 20 ug/kg/min via INTRAVENOUS
  Administered 2016-05-03 (×2): 25 ug/kg/min via INTRAVENOUS
  Filled 2016-05-02 (×5): qty 100

## 2016-05-02 MED ORDER — PENICILLIN G POTASSIUM 5000000 UNITS IJ SOLR
4.0000 10*6.[IU] | INTRAVENOUS | Status: AC
  Administered 2016-05-02 – 2016-05-03 (×6): 4 10*6.[IU] via INTRAVENOUS
  Filled 2016-05-02 (×9): qty 4

## 2016-05-02 MED ORDER — SODIUM CHLORIDE 0.9 % IV SOLN: 250.0000 mL | INTRAVENOUS | Status: DC | PRN

## 2016-05-02 MED ORDER — LORAZEPAM 2 MG/ML IJ SOLN
2.0000 mg | Freq: Once | INTRAMUSCULAR | Status: AC
  Administered 2016-05-02: 2 mg via INTRAVENOUS

## 2016-05-02 MED ORDER — NOREPINEPHRINE 4 MG/250ML-% IV SOLN: 2.0000 ug/min | INTRAVENOUS | Status: DC

## 2016-05-02 MED ORDER — ENOXAPARIN SODIUM 40 MG/0.4ML ~~LOC~~ SOLN: 40.0000 mg | SUBCUTANEOUS | Status: DC

## 2016-05-02 MED ORDER — VECURONIUM BROMIDE 10 MG IV SOLR
10.0000 mg | Freq: Once | INTRAVENOUS | Status: DC
  Filled 2016-05-02: qty 10

## 2016-05-02 MED ORDER — ASPIRIN 600 MG RE SUPP: 300.0000 mg | RECTAL | Status: DC

## 2016-05-02 MED ORDER — LORAZEPAM 2 MG/ML IJ SOLN
INTRAMUSCULAR | Status: AC
  Administered 2016-05-02: 1 mg via INTRAVENOUS
  Filled 2016-05-02: qty 1

## 2016-05-02 MED ORDER — IPRATROPIUM-ALBUTEROL 0.5-2.5 (3) MG/3ML IN SOLN
3.0000 mL | Freq: Four times a day (QID) | RESPIRATORY_TRACT | Status: DC
  Administered 2016-05-02 – 2016-05-09 (×26): 3 mL via RESPIRATORY_TRACT
  Filled 2016-05-02 (×28): qty 3

## 2016-05-02 MED ORDER — ENOXAPARIN SODIUM 150 MG/ML ~~LOC~~ SOLN
140.0000 mg | Freq: Two times a day (BID) | SUBCUTANEOUS | Status: DC
  Filled 2016-05-02: qty 0.93

## 2016-05-02 MED ORDER — SODIUM CHLORIDE 0.9 % IV SOLN
INTRAVENOUS | Status: DC
  Administered 2016-05-02 – 2016-05-03 (×2): via INTRAVENOUS

## 2016-05-02 MED ORDER — VANCOMYCIN HCL IN DEXTROSE 1-5 GM/200ML-% IV SOLN
1000.0000 mg | Freq: Once | INTRAVENOUS | Status: AC
  Administered 2016-05-02: 1000 mg via INTRAVENOUS
  Filled 2016-05-02: qty 200

## 2016-05-02 MED ORDER — MIDAZOLAM HCL 5 MG/5ML IJ SOLN
INTRAMUSCULAR | Status: AC
  Administered 2016-05-02: 2 mg via INTRAVENOUS
  Filled 2016-05-02: qty 5

## 2016-05-02 MED ORDER — ENOXAPARIN SODIUM 150 MG/ML ~~LOC~~ SOLN
140.0000 mg | Freq: Two times a day (BID) | SUBCUTANEOUS | Status: DC
  Administered 2016-05-03 – 2016-05-05 (×5): 140 mg via SUBCUTANEOUS
  Filled 2016-05-02 (×7): qty 0.93

## 2016-05-02 MED ORDER — SODIUM CHLORIDE 0.9 % IV SOLN
1.0000 mg/h | INTRAVENOUS | Status: DC
  Filled 2016-05-02: qty 10

## 2016-05-02 MED ORDER — NOREPINEPHRINE 4 MG/250ML-% IV SOLN
INTRAVENOUS | Status: AC
  Administered 2016-05-02: 2 ug/min
  Filled 2016-05-02: qty 250

## 2016-05-02 MED ORDER — ASPIRIN 81 MG PO CHEW: 324.0000 mg | CHEWABLE_TABLET | ORAL | Status: DC

## 2016-05-02 MED ORDER — ENOXAPARIN SODIUM 100 MG/ML ~~LOC~~ SOLN
100.0000 mg | Freq: Once | SUBCUTANEOUS | Status: AC
  Administered 2016-05-02: 100 mg via SUBCUTANEOUS
  Filled 2016-05-02: qty 1

## 2016-05-02 MED ORDER — INSULIN ASPART 100 UNIT/ML ~~LOC~~ SOLN
2.0000 [IU] | SUBCUTANEOUS | Status: DC
  Administered 2016-05-02 – 2016-05-03 (×3): 6 [IU] via SUBCUTANEOUS
  Administered 2016-05-03: 4 [IU] via SUBCUTANEOUS
  Administered 2016-05-03: 6 [IU] via SUBCUTANEOUS
  Administered 2016-05-03: 4 [IU] via SUBCUTANEOUS
  Filled 2016-05-02: qty 6
  Filled 2016-05-02: qty 4
  Filled 2016-05-02 (×3): qty 6
  Filled 2016-05-02: qty 4

## 2016-05-02 MED ORDER — IOPAMIDOL (ISOVUE-370) INJECTION 76%
60.0000 mL | Freq: Once | INTRAVENOUS | Status: AC | PRN
  Administered 2016-05-02: 60 mL via INTRAVENOUS

## 2016-05-02 MED ORDER — DEXTROSE 5 % IV SOLN
2.0000 g | Freq: Two times a day (BID) | INTRAVENOUS | Status: DC
  Filled 2016-05-02: qty 2

## 2016-05-02 NOTE — Care Management Note (Signed)
Case Management Note  Patient Details  Name: Marabella Popiel MRN: [id number] Date of Birth: 05/19/1951  Subjective/Objective:     Research at request of Dr. Stevenson Clinch, shows that [facility] does not have a record of any HCPOA for the patient. She has  A cousin named Cleda Clarks listed as an emergency contact, and that's it.               Action/Plan:   Expected Discharge Date:                  Expected Discharge Plan:     In-House Referral:     Discharge planning Services     Post Acute Care Choice:    Choice offered to:     DME Arranged:    DME Agency:     HH Arranged:    HH Agency:     Status of Service:     If discussed at [employer] of Stay Meetings, dates discussed:    Additional Comments:  Beau Fanny, RN 05/02/2016, 11:50 AM

## 2016-05-02 NOTE — ED Provider Notes (Addendum)
[facility] Emergency Department Provider Note   L5 caveat: Review of systems and history is limited by respiratory distress.     Time seen: ----------------------------------------- 9:25 AM on 05/02/2016 -----------------------------------------    I have reviewed the triage vital signs and the nursing notes.   HISTORY  Chief Complaint Shortness of Breath and Respiratory Distress    HPI [name] is a 65 y.o. female who is brought into the ER by EMS for respiratory distress.Patient arrived in severe distress on CPAP tachypnea, hypoxic and tachycardic. O2 sats was 73% on CPAP. She was also profusely diaphoretic. Patient was in too much distress to give review of systems or report.   Past Medical History:  Diagnosis Date  . Diabetes mellitus without complication ([place])   . Hypertension     There are no active problems to display for this patient.   History reviewed. No pertinent surgical history.  Allergies Patient has no allergy information on record.  Social History Social History  Substance Use Topics  . Smoking status: Never Smoker  . Smokeless tobacco: Never Used  . Alcohol use No    Review of Systems Unknown, positive for difficulty breathing, diaphoresis  ____________________________________________   PHYSICAL EXAM:  VITAL SIGNS: ED Triage Vitals [05/02/16 0918]  Enc Vitals Group     BP (!) 105/92     Pulse Rate (!) 145     Resp (!) 30     Temp      Temp src      SpO2 (!) 72 %     Weight      Height      Head Circumference      Peak Flow      Pain Score      Pain Loc      Pain Edu?      Excl. in [place]?     Constitutional: Alert, Severe distress Eyes: Conjunctivae are normal. PERRL. Normal extraocular movements. ENT   Head: Normocephalic and atraumatic.   Nose: No congestion/rhinnorhea.   Mouth/Throat: Mucous membranes are moist.   Neck: No stridor. Cardiovascular: Rapid rate, regular rhythm,  No murmurs, rubs, or gallops. Respiratory: Severe tachypnea, diffuse rales bilaterally Gastrointestinal: Soft and nontender. Normal bowel sounds Musculoskeletal: Bilateral Unna boot dressings. Neurologic:  No gross focal neurologic deficits are appreciated.  Skin:  Profuse diaphoresis. Psychiatric: Anxious ____________________________________________  EKG: Interpreted by me. Tachycardia with a rate of 146 bpm, wide QRS, long QT, left bundle branch block, leftward axis  ____________________________________________  ED COURSE:  Pertinent labs & imaging results that were available during my care of the patient were reviewed by me and considered in my medical decision making (see chart for details). Clinical Course   Patient presents to the ER in respiratory distress. She will need rapid sequence intubation.  Procedure Name: Intubation Date/Time: 05/02/2016 9:28 AM Performed by: Earleen Newport Pre-anesthesia Checklist: Patient identified Oxygen Delivery Method: Non-rebreather mask Preoxygenation: 76%  Intubation Type: Rapid sequence Ventilation: Mask ventilation with difficulty, Two handed mask ventilation required and Oral airway inserted - appropriate to patient size Laryngoscope Size: Mac and 4 Grade View: Grade II Tube size: 8.0 mm Number of attempts: 1 Airway Equipment and Method: Oral airway Placement Confirmation: ETT inserted through vocal cords under direct vision ETT to lip (cm): 24. Tube secured with: ETT holder Dental Injury: Teeth and Oropharynx as per pre-operative assessment  Difficulty Due To: Difficulty was anticipated     .Central Line Date/Time: 05/02/2016 12:38 PM Performed by:  Lenise Arena E Authorized by: Lenise Arena E   Consent:    Consent obtained:  Emergent situation Pre-procedure details:    Skin preparation:  2% chlorhexidine Anesthesia (see MAR for exact dosages):    Anesthesia method:  Local infiltration   Local anesthetic:   Lidocaine 1% w/o epi Procedure details:    Location:  R internal jugular   Patient position:  Trendelenburg   Procedural supplies:  Triple lumen   Landmarks identified: yes     Ultrasound guidance: yes     Sterile ultrasound techniques: Sterile gel and sterile probe covers were used     Number of attempts:  1   Successful placement: yes   Post-procedure details:    Post-procedure:  Dressing applied   Assessment:  Blood return through all ports and free fluid flow   Patient tolerance of procedure:  Tolerated well, no immediate complications   ____________________________________________   LABS (pertinent positives/negatives)  Labs Reviewed  CBC WITH DIFFERENTIAL/PLATELET - Abnormal; Notable for the following:       Result Value   WBC 29.6 (*)    RDW 15.6 (*)    Neutro Abs 27.2 (*)    All other components within normal limits  BRAIN NATRIURETIC PEPTIDE - Abnormal; Notable for the following:    B Natriuretic Peptide 286.0 (*)    All other components within normal limits  TROPONIN I - Abnormal; Notable for the following:    Troponin I 0.14 (*)    All other components within normal limits  BLOOD GAS, ARTERIAL - Abnormal; Notable for the following:    pH, Arterial 7.16 (*)    pCO2 arterial 62 (*)    pO2, Arterial 68 (*)    Acid-base deficit 7.4 (*)    Allens test (pass/fail) POSITIVE (*)    All other components within normal limits  LACTIC ACID, PLASMA - Abnormal; Notable for the following:    Lactic Acid, Venous 7.1 (*)    All other components within normal limits  COMPREHENSIVE METABOLIC PANEL - Abnormal; Notable for the following:    CO2 17 (*)    Glucose, Bld 402 (*)    BUN 33 (*)    Creatinine, Ser 1.36 (*)    Calcium 8.7 (*)    GFR calc non Af Amer 40 (*)    GFR calc Af Amer 46 (*)    Anion gap 17 (*)    All other components within normal limits  CULTURE, BLOOD (ROUTINE X 2)  CULTURE, BLOOD (ROUTINE X 2)  LACTIC ACID, PLASMA  TRIGLYCERIDES   CRITICAL  CARE Performed by: Earleen Newport   Total critical care time: 30 minutes  Critical care time was exclusive of separately billable procedures and treating other patients.  Critical care was necessary to treat or prevent imminent or life-threatening deterioration.  Critical care was time spent personally by me on the following activities: development of treatment plan with patient and/or surrogate as well as nursing, discussions with consultants, evaluation of patient's response to treatment, examination of patient, obtaining history from patient or surrogate, ordering and performing treatments and interventions, ordering and review of laboratory studies, ordering and review of radiographic studies, pulse oximetry and re-evaluation of patient's condition.  RADIOLOGY Images were viewed by me  Chest x-ray IMPRESSION: Tube positions as described without pneumothorax. Evidence of underlying congestive heart failure. Probable multifocal pneumonia on the right superimposed on edema, although the opacity on the right could be due to alveolar edema in a somewhat atypical distribution.  Both pneumonia and alveolar edema may present concurrently. ____________________________________________  FINAL ASSESSMENT AND PLAN  Acute respiratory failure, rapid sequence intubation, lactic acidosis, Pneumonia  Plan: Patient with labs and imaging as dictated above. Patient presented to the ER in acute respiratory distress and failure. Patient required rapid sequence intubation, x-ray and labs reveal likely underlying pneumonia likely complicated by CHF and possibly her aortic stenosis. Initially this presented as a CHF exacerbation but appears to be much more infection related. She is acidemic and remains hypoxic on 100% FiO2. She is normotensive at this time, heart rate has improved slightly. Patient remains in critical condition, I will discuss with eICU doctor.   Earleen Newport, MD   Note:  This dictation was prepared with Dragon dictation. Any transcriptional errors that result from this process are unintentional    Earleen Newport, MD 05/02/16 1030    Earleen Newport, MD 05/02/16 1055    Earleen Newport, MD 05/02/16 [phone]

## 2016-05-02 NOTE — Progress Notes (Signed)
Lab called with a critical troponin level of 55.29. Dr. Alva Garnet at [facility] notified. Dr. Alva Garnet stated to continue to monitor pt, no new orders at this time. Will continue to monitor.

## 2016-05-02 NOTE — Progress Notes (Signed)
PHARMACY - PHYSICIAN COMMUNICATION CRITICAL VALUE ALERT - BLOOD CULTURE IDENTIFICATION (BCID)  Results for orders placed or performed during the hospital encounter of 05/02/16  Blood Culture ID Panel (Reflexed) (Collected: 05/02/2016  9:22 AM)  Result Value Ref Range   Enterococcus species NOT DETECTED NOT DETECTED   Listeria monocytogenes NOT DETECTED NOT DETECTED   Staphylococcus species NOT DETECTED NOT DETECTED   Staphylococcus aureus NOT DETECTED NOT DETECTED   Streptococcus species DETECTED (A) NOT DETECTED   Streptococcus agalactiae DETECTED (A) NOT DETECTED   Streptococcus pneumoniae NOT DETECTED NOT DETECTED   Streptococcus pyogenes NOT DETECTED NOT DETECTED   Acinetobacter baumannii NOT DETECTED NOT DETECTED   Enterobacteriaceae species NOT DETECTED NOT DETECTED   Enterobacter cloacae complex NOT DETECTED NOT DETECTED   Escherichia coli NOT DETECTED NOT DETECTED   Klebsiella oxytoca NOT DETECTED NOT DETECTED   Klebsiella pneumoniae NOT DETECTED NOT DETECTED   Proteus species NOT DETECTED NOT DETECTED   Serratia marcescens NOT DETECTED NOT DETECTED   Haemophilus influenzae NOT DETECTED NOT DETECTED   Neisseria meningitidis NOT DETECTED NOT DETECTED   Pseudomonas aeruginosa NOT DETECTED NOT DETECTED   Candida albicans NOT DETECTED NOT DETECTED   Candida glabrata NOT DETECTED NOT DETECTED   Candida krusei NOT DETECTED NOT DETECTED   Candida parapsilosis NOT DETECTED NOT DETECTED   Candida tropicalis NOT DETECTED NOT DETECTED    Name of physician (or Provider) Contacted: Bincy Varughese   Changes to prescribed antibiotics required: Yes, Will d/c Vanc and Cefepime and start Penicillin 4 million units IV Q4H   Robert Sunga D 05/02/2016  9:55 PM

## 2016-05-02 NOTE — Progress Notes (Addendum)
Pharmacy Antibiotic Note  [name] is a 65 y.o. female admitted on 05/02/2016 with pneumonia.  Pharmacy has been consulted for cefepime and vancomycin dosing. Pharmacy has also been consulted to monitor and assist in electrolyte replacement.   Plan: Vancomycin and cefepime: patient received vancomycin 1000 mg IV and piperacillin/tazobactam 3.375 g IV once in the ED. Patient will be continued on vancomycin 1000 mg IV q12h with a goal trough of 15-20. Trough will be checked prior to 5th dose. Due to TBW, patient will likely accumulate. PK:  T1/2 = 12.83  Vd = 63.7  Ke = 0.054 Dosing weight: 91 kg  Patient will receive cefepime 2 g IV q 12 h beginning this evening based on CrCl 54.1  Electrolytes: WNL - will f/u in am  Height: 5\' 5"  (165.1 cm) Weight: (!) 313 lb (142 kg) IBW/kg (Calculated) : 57  No data recorded.   Recent Labs Lab 05/02/16 0922 05/02/16 1250  WBC 29.6*  --   CREATININE 1.36*  --   LATICACIDVEN 7.1* 2.7*    Estimated Creatinine Clearance: 59.2 mL/min (by C-G formula based on SCr of 1.36 mg/dL (H)).    Allergies  Allergen Reactions  . Lisinopril     Other reaction(s): Unknown  . Niacin     Other reaction(s): Unknown  . Sitagliptin     Other reaction(s): Unknown  . Sulfamethoxazole-Trimethoprim     Other reaction(s): Unknown  . Tetanus Toxoids     Other reaction(s): Unknown    Antimicrobials this admission: Piperacillin/tazo 11/7 x 1 Cefepime 11/7 >>  Vancomycin 11/7 >>   Dose adjustments this admission:   Microbiology results: 11/7 BCx: sent 11/7 UCx: sent  11/7 Sputum: sent  11/7 MRSA PCR: sent  Thank you for allowing pharmacy to be a part of this patient's care.  Darrow Bussing, PharmD Pharmacy Resident 05/02/2016 2:58 PM

## 2016-05-02 NOTE — Progress Notes (Signed)
PHARMACIST - PHYSICIAN COMMUNICATION  CONCERNING:  Enoxaparin (Lovenox) for DVT Prophylaxis    RECOMMENDATION: Patient was prescribed enoxaprin 40mg  q24 hours for VTE prophylaxis.   Filed Weights   05/02/16 0942 05/02/16 1400  Weight: (!) 313 lb (142 kg) (!) 313 lb 4.4 oz (142.1 kg)    Body mass index is 46.26 kg/m.  Estimated Creatinine Clearance: 57.4 mL/min (by C-G formula based on SCr of 1.49 mg/dL (H)).   Based on [place] patient is candidate for enoxaparin 40mg  every 12 hour dosing due to BMI being >40.  DESCRIPTION: Pharmacy has adjusted enoxaparin dose per [facility] policy.  Patient is now receiving enoxaparin 40mg  every 12 hours.   Darrow Bussing, PharmD Pharmacy Resident 05/02/2016 5:00 PM

## 2016-05-02 NOTE — ED Triage Notes (Deleted)
Pt to ed with c/o lower back pain that started this am.  Denies injury.

## 2016-05-02 NOTE — ED Notes (Signed)
Waiting for RT to transport pt to CT ang

## 2016-05-02 NOTE — Progress Notes (Addendum)
ANTICOAGULATION CONSULT NOTE - Initial Consult  Pharmacy Consult for Lovenox  Indication: chest pain/ACS  Allergies  Allergen Reactions  . Lisinopril     Other reaction(s): Unknown  . Niacin     Other reaction(s): Unknown  . Sitagliptin     Other reaction(s): Unknown  . Sulfamethoxazole-Trimethoprim     Other reaction(s): Unknown  . Tetanus Toxoids     Other reaction(s): Unknown    Patient Measurements: Height: 5\' 9"  (175.3 cm) Weight: (!) 313 lb 4.4 oz (142.1 kg) IBW/kg (Calculated) : 66.2 Heparin Dosing Weight:   Vital Signs: Temp: 98.8 F (37.1 C) (11/07 1900) Temp Source: Oral (11/07 1900) BP: 105/74 (11/07 2100) Pulse Rate: 96 (11/07 2100)  Labs:  Recent Labs  05/02/16 0922 05/02/16 1250 05/02/16 1405 05/02/16 1814  HGB 12.5  --   --   --   HCT 38.8  --   --   --   PLT 333  --   --   --   CREATININE 1.36*  --  1.49*  --   TROPONINI 0.14* 9.38*  --  55.29*    Estimated Creatinine Clearance: 57.4 mL/min (by C-G formula based on SCr of 1.49 mg/dL (H)).   Medical History: Past Medical History:  Diagnosis Date  . CHF (congestive heart failure) ([place])   . Diabetes mellitus without complication ([place])   . Hypertension     Medications:  Prescriptions Prior to Admission  Medication Sig Dispense Refill Last Dose  . amLODipine (NORVASC) 5 MG tablet Take 1 tablet by mouth daily.   unknown at unknown  . cloNIDine (CATAPRES) 0.1 MG tablet Take 1 tablet by mouth 2 (two) times daily.   unknown at unknown  . fenofibrate (TRICOR) 145 MG tablet Take 1 tablet by mouth daily.   unknown at unknown  . furosemide (LASIX) 40 MG tablet Take 1 tablet by mouth daily.   unknown at unknown  . LANTUS SOLOSTAR 100 UNIT/ML Solostar Pen Inject 50-54 Units into the skin daily at 10 pm. Titrate for morning sugars less than 120   unknown at unknown  . losartan (COZAAR) 100 MG tablet Take 1 tablet by mouth daily.   unknown at unknown  . metFORMIN (GLUCOPHAGE) 1000 MG tablet Take 1  tablet by mouth 2 (two) times daily.   unknown at unknown  . nystatin (MYCOSTATIN/NYSTOP) powder Apply topically 4 (four) times daily.    unknown at unknown    Assessment: Lovenox 40 mg SQ given on 11/07 @ 21:00.  TBW = 142.1 kg  CrCl = 57.4 ml/min   Goal of Therapy:  resolution of NSTEMI Monitor platelets by anticoagulation protocol: Yes   Plan:  Lovenox 100 mg SQ X 1 ordered to be given on 11/07 @ 22:00 to make total evening dose of 140 mg. Lovenox 140 mg SQ Q12H ordered to start on 11/07 @ 22:00.   Cache Decoursey D 05/02/2016,9:36 PM

## 2016-05-02 NOTE — Care Management Note (Signed)
Case Management Note  Patient Details  Name: [name] MRN: [id number] Date of Birth: 07/30/1950  Subjective/Objective:                  Never been married; no children, all siblings have passed, and no HCPOA assigned. Rayetta Humphrey is her cousin "that is closest to her"- Gregary Signs has other siblings that are not involved. Patient has a real-close friend that has Parkinson's and the daughter in law of this friend has helped with taking her to [facility] (recently last week). Patient has private duty nursing every day M-F through [place] [facility] [place]- under Medicaid. Patient can walk to car from her apartment- uses a cane. She uses [employer] for long walking needs such as in [employer]- she doesn't own one. She does not have a wheelchair or walker available at home either; no home O2. PCP is at [facility]. They help her with her medicines. Patient has been able to make health care decisions on her own. Gregary Signs talked to patient twice yesterday by phone and patient didn't seem SOB and didn't have any complaints. Santiago Glad with [facility] "found patient like this today and called 911; I'm not sure what happened". No history of SNF or home health per Gregary Signs.   Action/Plan: Explained my role in home health services. RNCM will need to continue to follow.     Expected Discharge Date:                  Expected Discharge Plan:     In-House Referral:     Discharge planning Services  CM Consult  Post Acute Care Choice:    Choice offered to:     DME Arranged:    DME Agency:     HH Arranged:    HH Agency:     Status of Service:  In process, will continue to follow  If discussed at Long Length of Stay Meetings, dates discussed:    Additional Comments:  Marshell Garfinkel, RN 05/02/2016, 2:19 PM

## 2016-05-02 NOTE — ED Notes (Signed)
Dr. Jimmye Norman notified of critical lactic acid and trop

## 2016-05-02 NOTE — Progress Notes (Signed)
*  PRELIMINARY RESULTS* Echocardiogram 2D Echocardiogram has been performed.  Sherrie Sport 05/02/2016, 5:35 PM

## 2016-05-02 NOTE — ED Notes (Signed)
Upon arrival pt has bilateral rash on hands, redness

## 2016-05-02 NOTE — Consult Note (Signed)
[facility] Cardiology  CARDIOLOGY CONSULT NOTE  Patient ID: [name] MRN: [id number] DOB/AGE: 01-03-1951 65 y.o.  Admit date: 05/02/2016 Referring Physician Jimmye Norman Primary Physician [facility] Primary Cardiologist Nehemiah Massed Reason for Consultation aortic stenosis  HPI: 65 year old female referred for evaluation of respiratory failure in the setting of aortic stenosis. The patient was brought to [facility] emergency room with acute respiratory failure. ECG revealed sinus tachycardia with left bundle branch block. Chest CT was performed which did not reveal evidence for pulmonary embolus. It did show evidence for interstitial and alveolar edema suspicious for congestive heart failure with superimposed pneumonia. Admission labs were notable for elevated white count of 29,600, troponin of 9.38, pH of 7.16. Patient has known history of severe aortic stenosis, with echocardiogram 01/07/2015 revealing normal left ventricular function, with LVEF greater than 55%, with calculated aortic valve area 0.49 cm, mean gradient of 72.3 mmHg, and peak gradient 140.7 mmHg.  Review of systems complete and found to be negative unless listed above     Past Medical History:  Diagnosis Date  . CHF (congestive heart failure) ([place])   . Diabetes mellitus without complication ([place])   . Hypertension     History reviewed. No pertinent surgical history.   (Not in a hospital admission) Social History   Social History  . Marital status: Single    Spouse name: N/A  . Number of children: N/A  . Years of education: N/A   Occupational History  . Not on file.   Social History Main Topics  . Smoking status: Never Smoker  . Smokeless tobacco: Never Used  . Alcohol use No  . Drug use: No  . Sexual activity: Not on file   Other Topics Concern  . Not on file   Social History Narrative  . No narrative on file    History reviewed. No pertinent family history.    Review of systems complete and found to be  negative unless listed above      PHYSICAL EXAM  General: Well developed, well nourished, in no acute distress HEENT:  Normocephalic and atramatic Neck:  No JVD.  Lungs: Clear bilaterally to auscultation and percussion. Heart: HRRR . Normal S1 and S2 without gallops or murmurs.  Abdomen: Bowel sounds are positive, abdomen soft and non-tender  Msk:  Back normal, normal gait. Normal strength and tone for age. Extremities: No clubbing, cyanosis or edema.   Neuro: Alert and oriented X 3. Psych:  Good affect, responds appropriately  Labs:   Lab Results  Component Value Date   WBC 29.6 (H) 05/02/2016   HGB 12.5 05/02/2016   HCT 38.8 05/02/2016   MCV 85.2 05/02/2016   PLT 333 05/02/2016    Recent Labs Lab 05/02/16 0922  NA 137  K 4.2  CL 103  CO2 17*  BUN 33*  CREATININE 1.36*  CALCIUM 8.7*  PROT 7.6  BILITOT 0.7  ALKPHOS 81  ALT 17  AST 33  GLUCOSE 402*   Lab Results  Component Value Date   TROPONINI 9.38 ([place]) 05/02/2016   No results found for: CHOL No results found for: HDL No results found for: [facility] Lab Results  Component Value Date   TRIG 87 05/02/2016   No results found for: CHOLHDL No results found for: LDLDIRECT    Radiology: Dg Abdomen 1 View  Result Date: 05/02/2016 CLINICAL DATA:  Hypoxia. EXAM: ABDOMEN - 1 VIEW COMPARISON:  No recent prior. FINDINGS: NG tube noted coiled stomach. Soft tissue structures are unremarkable air-filled loops of  colon and possibly small bowel noted. Mild adynamic ileus cannot be excluded. No free air. Nodular bilateral pulmonary infiltrates noted, reference made to today's chest x-ray report. IMPRESSION: NG tube noted coiled stomach. Mild adynamic ileus cannot be excluded . Electronically Signed   By: Marcello Moores  Register   On: 05/02/2016 10:04   Ct Angio Chest Pe W And/or Wo Contrast  Result Date: 05/02/2016 CLINICAL DATA:  Hypoxia and tachycardia EXAM: CT ANGIOGRAPHY CHEST WITH CONTRAST TECHNIQUE: Multidetector CT imaging  of the chest was performed using the standard protocol during bolus administration of intravenous contrast. Multiplanar CT image reconstructions and MIPs were obtained to evaluate the vascular anatomy. CONTRAST:  60 mL Isovue 370 nonionic COMPARISON:  Chest radiograph May 02, 2016 FINDINGS: Cardiovascular: There is no demonstrable pulmonary embolus. The ascending thoracic aortic diameter is 4.3 x 4.2 cm. There is no thoracic aortic dissection. The visualized great vessels appear unremarkable. There is cardiomegaly. The pericardium is not appreciably thickened. There is rather minimal coronary artery calcification evident. Mediastinum/Nodes: Visualized thyroid appears unremarkable. There are scattered sub cm mediastinal lymph nodes but no adenopathy evident. Endotracheal tube is present with the tip in the distal trachea. Nasogastric tube extends into the stomach. Lungs/Pleura: There is widespread interstitial and alveolar edema throughout the lungs. There is airspace consolidation in both lower lobes with small pleural effusions bilaterally. Upper Abdomen: Visualized upper abdominal structures appear unremarkable. Note that there is a degree of fatty infiltration in the pancreas. Musculoskeletal: There are no blastic or lytic bone lesions. Review of the MIP images confirms the above findings. IMPRESSION: No demonstrable pulmonary embolus. Ascending thoracic aorta measures 4.3 x 4.2 cm. Recommend annual imaging followup by CTA or MRA. This recommendation follows 2010 ACCF/AHA/AATS/ACR/ASA/SCA/SCAI/SIR/STS/SVM Guidelines for the Diagnosis and Management of Patients with Thoracic Aortic Disease. Circulation. 2010; 121: I778-E423. No thoracic aortic dissection. Extensive interstitial and alveolar edema with bibasilar consolidation and small pleural effusions. Suspect congestive heart failure, although superimposed pneumonia and/ or ARDS may be present. More than one of these entities may be present concurrently.  Heart is enlarged. There is rather minimal coronary artery calcification evident. Patient is intubated. Nasogastric tube tip in stomach. No pneumothorax. Electronically Signed   By: Lowella Grip III M.D.   On: 05/02/2016 12:21   Dg Chest Port 1 View  Result Date: 05/02/2016 CLINICAL DATA:  Hypoxia EXAM: PORTABLE CHEST 1 VIEW COMPARISON:  None. FINDINGS: Endotracheal tube tip is 3.1 cm above the carina. Nasogastric tube tip and side port are below the diaphragm. No pneumothorax. There is widespread airspace consolidation throughout the right lung. There is moderate underlying interstitial edema. Heart is mildly enlarged with pulmonary vascularity within normal limits. No evident adenopathy. IMPRESSION: Tube positions as described without pneumothorax. Evidence of underlying congestive heart failure. Probable multifocal pneumonia on the right superimposed on edema, although the opacity on the right could be due to alveolar edema in a somewhat atypical distribution. Both pneumonia and alveolar edema may present concurrently. Electronically Signed   By: Lowella Grip III M.D.   On: 05/02/2016 10:08    EKG: Sinus tachycardia with left bundle branch block  ASSESSMENT AND PLAN:   1. Respiratory failure, likely multifactorial, secondary to pneumonia and sepsis, with element of CHF 2. Probable non-STEMI in patient with respiratory failure/pneumonia/sepsis 3. Severe aortic stenosis, previously reluctant to undergo valve replacement surgery 4. Very poor prognosis  Recommendations  1. Agree with overall current therapy 2. Defer urgent invasive cardiac evaluation 3. Consider therapeutic Lovenox 4. 2-D echocardiogram 5. Further  recommendations pending patient's initial clinical course and 2-D echocardiogram results  Signed: Isaias Cowman MD,PhD, [facility] 05/02/2016, 1:43 PM

## 2016-05-02 NOTE — ED Notes (Signed)
ED Provider at [facility] to place central line access

## 2016-05-02 NOTE — ED Triage Notes (Signed)
Pt arrives via EMS from home, pt in resp distress, arrives on CPAP, EDP at bedside upon arrival, pt o2 sat 73% on CPAP, tachypenic and diaphoretic

## 2016-05-02 NOTE — ED Notes (Signed)
Family member, Cleda Clarks cousin [phone]

## 2016-05-02 NOTE — Progress Notes (Signed)
Took patient to CT on transport ventilator.  Patient tolerated transport well HR 120 oxygen saturations 96%.  Patient remained on same vent settings for entire transport time rate 16 tidal volume 450 peep 8 and FIO2 100%.

## 2016-05-02 NOTE — H&P (Signed)
PULMONARY / CRITICAL CARE MEDICINE   Name: [name] MRN: [id number] DOB: May 08, 1951    ADMISSION DATE:  05/02/2016 CONSULTATION DATE:  05/02/16  REFERRING MD :  Dr. Jimmye Norman (ER)   CHIEF COMPLAINT:     Respiratory failure   HISTORY OF PRESENT ILLNESS    65 year old female past medical history of morbid obesity,OSA/OHS, hypertension, diabetes, severe aortic stenosis, presents in respiratory failure today requiring emergent intubation in the ER. History per chart review and cousin who was at the bedside, cousin stated that she spoke to patient last night who was in her normal state of health. However, this morning when the nursing aide went to visit patient she was noted to be in severe respiratory distress, desaturating on her CPAP machine, she was emergently brought to the ER and was not improving, noted to have significant desaturations in the 70s and low 80s, she was emergently intubated. Labs in the ER significant for mixed metabolic and respiratory acidosis, pH 7.16/CO2 62, lactic acid 7.1, white cell count 29.6, troponin 0.14, BNP 286 Off note, her cousin states that she has been following with her cardiologist routinely, she has been offered surgical options for severe aortic stenosis, but patient has refused. Cousin of the bedside believe she is a healthcare power of attorney and stated that the patient was to have any further cardiac events on current aggressive life-saving measures, then she is to be a DO NOT RESUSCITATE   SIGNIFICANT EVENTS  11/7> found by nursing aid at home in respiratory distress with cpap, intubated in the ER.    PAST MEDICAL HISTORY    :  Past Medical History:  Diagnosis Date  . Diabetes mellitus without complication ([place])   . Hypertension    History reviewed. No pertinent surgical history. Prior to Admission medications   Medication Sig Start Date End Date Taking? Authorizing Provider  amLODipine (NORVASC) 5 MG tablet Take 1 tablet by  mouth daily. 04/17/16  Yes Historical Provider, MD  cloNIDine (CATAPRES) 0.1 MG tablet Take 1 tablet by mouth 2 (two) times daily. 04/17/16  Yes Historical Provider, MD  fenofibrate (TRICOR) 145 MG tablet Take 1 tablet by mouth daily. 04/17/16  Yes Historical Provider, MD  furosemide (LASIX) 40 MG tablet Take 1 tablet by mouth daily. 04/17/16  Yes Historical Provider, MD  LANTUS SOLOSTAR 100 UNIT/ML Solostar Pen Inject 50-54 Units into the skin daily at 10 pm. Titrate for morning sugars less than 120 04/11/16  Yes Historical Provider, MD  losartan (COZAAR) 100 MG tablet Take 1 tablet by mouth daily. 04/28/16  Yes Historical Provider, MD  metFORMIN (GLUCOPHAGE) 1000 MG tablet Take 1 tablet by mouth 2 (two) times daily. 04/17/16  Yes Historical Provider, MD  nystatin (MYCOSTATIN/NYSTOP) powder Apply topically 4 (four) times daily.  04/18/16  Yes Historical Provider, MD   Allergies  Allergen Reactions  . Lisinopril     Other reaction(s): Unknown  . Niacin     Other reaction(s): Unknown  . Sitagliptin     Other reaction(s): Unknown  . Sulfamethoxazole-Trimethoprim     Other reaction(s): Unknown  . Tetanus Toxoids     Other reaction(s): Unknown     FAMILY HISTORY   History reviewed. No pertinent family history.    SOCIAL HISTORY    reports that she has never smoked. She has never used smokeless tobacco. She reports that she does not drink alcohol or use drugs.  Review of Systems  Unable to perform ROS: Intubated      VITAL  SIGNS    Pulse Rate:  [130-145] 130 (11/07 1100) Resp:  [21-41] 26 (11/07 1100) BP: (105-172)/(79-136) 109/79 (11/07 1100) SpO2:  [72 %-95 %] 93 % (11/07 1100) FiO2 (%):  [100 %] 100 % (11/07 1051) Weight:  [313 lb (142 kg)] 313 lb (142 kg) (11/07 0942) HEMODYNAMICS:   VENTILATOR SETTINGS: Vent Mode: AC FiO2 (%):  [100 %] 100 % Set Rate:  [16 bmp] 16 bmp Vt Set:  [450 mL] 450 mL PEEP:  [5 cmH20] 5 cmH20 INTAKE / OUTPUT:  Intake/Output Summary  (Last 24 hours) at 05/02/16 1151 Last data filed at 05/02/16 1036  Gross per 24 hour  Intake               50 ml  Output                0 ml  Net               50 ml       PHYSICAL EXAM   Physical Exam  Constitutional: She appears well-developed and well-nourished.  Intubated on ventilator  HENT:  Head: Normocephalic and atraumatic.  Right Ear: External ear normal.  Left Ear: External ear normal.  Neck: Neck supple.  Cardiovascular:  Tachycardia, S1, S2 holosystolic murmur noted  Pulmonary/Chest: She is in respiratory distress. She has wheezes. She has no rales.  Coarse upper airway sounds on the ventilator, shallow breath sounds bilaterally, difficult to auscultate due to morbid obesity  Abdominal: Soft. She exhibits no distension. There is no tenderness.  Neurological:  Intubated and sedated  Skin: Skin is warm and dry.  Diffuse erythematous rash on upper extremities bilaterally  Nursing note and vitals reviewed.      LABS   LABS:  CBC  Recent Labs Lab 05/02/16 0922  WBC 29.6*  HGB 12.5  HCT 38.8  PLT 333   Coag's No results for input(s): APTT, INR in the last 168 hours. BMET  Recent Labs Lab 05/02/16 0922  NA 137  K 4.2  CL 103  CO2 17*  BUN 33*  CREATININE 1.36*  GLUCOSE 402*   Electrolytes  Recent Labs Lab 05/02/16 0922  CALCIUM 8.7*   Sepsis Markers  Recent Labs Lab 05/02/16 0922  LATICACIDVEN 7.1*   ABG  Recent Labs Lab 05/02/16 0958 05/02/16 1117  PHART 7.16* 7.22*  PCO2ART 62* 61*  PO2ART 68* 79*   Liver Enzymes  Recent Labs Lab 05/02/16 0922  AST 33  ALT 17  ALKPHOS 81  BILITOT 0.7  ALBUMIN 3.7   Cardiac Enzymes  Recent Labs Lab 05/02/16 0922  TROPONINI 0.14*   Glucose No results for input(s): GLUCAP in the last 168 hours.   No results found for this or any previous visit (from the past 240 hour(s)).   Current Facility-Administered Medications:  .  famotidine (PEPCID) IVPB 20 mg premix, 20 mg,  Intravenous, Q12H, Tel Hevia, MD .  insulin aspart (novoLOG) injection 2-6 Units, 2-6 Units, Subcutaneous, Q4H, Satoria Dunlop, MD .  LORazepam (ATIVAN) injection 2 mg, 2 mg, Intravenous, Once, Earleen Newport, MD .  propofol (DIPRIVAN) 1000 MG/100ML infusion, 5-80 mcg/kg/min, Intravenous, Titrated, Earleen Newport, MD, 40 mcg/kg/min at 05/02/16 1051 .  vecuronium (NORCURON) injection 10 mg, 10 mg, Intravenous, Once, Earleen Newport, MD  Current Outpatient Prescriptions:  .  amLODipine (NORVASC) 5 MG tablet, Take 1 tablet by mouth daily., Disp: , Rfl:  .  cloNIDine (CATAPRES) 0.1 MG tablet, Take 1 tablet by mouth 2 (  two) times daily., Disp: , Rfl:  .  fenofibrate (TRICOR) 145 MG tablet, Take 1 tablet by mouth daily., Disp: , Rfl:  .  furosemide (LASIX) 40 MG tablet, Take 1 tablet by mouth daily., Disp: , Rfl:  .  LANTUS SOLOSTAR 100 UNIT/ML Solostar Pen, Inject 50-54 Units into the skin daily at 10 pm. Titrate for morning sugars less than 120, Disp: , Rfl:  .  losartan (COZAAR) 100 MG tablet, Take 1 tablet by mouth daily., Disp: , Rfl:  .  metFORMIN (GLUCOPHAGE) 1000 MG tablet, Take 1 tablet by mouth 2 (two) times daily., Disp: , Rfl:  .  nystatin (MYCOSTATIN/NYSTOP) powder, Apply topically 4 (four) times daily. , Disp: , Rfl:   IMAGING    Dg Abdomen 1 View  Result Date: 05/02/2016 CLINICAL DATA:  Hypoxia. EXAM: ABDOMEN - 1 VIEW COMPARISON:  No recent prior. FINDINGS: NG tube noted coiled stomach. Soft tissue structures are unremarkable air-filled loops of colon and possibly small bowel noted. Mild adynamic ileus cannot be excluded. No free air. Nodular bilateral pulmonary infiltrates noted, reference made to today's chest x-ray report. IMPRESSION: NG tube noted coiled stomach. Mild adynamic ileus cannot be excluded . Electronically Signed   By: Marcello Moores  Register   On: 05/02/2016 10:04   Dg Chest Port 1 View  Result Date: 05/02/2016 CLINICAL DATA:  Hypoxia EXAM: PORTABLE CHEST  1 VIEW COMPARISON:  None. FINDINGS: Endotracheal tube tip is 3.1 cm above the carina. Nasogastric tube tip and side port are below the diaphragm. No pneumothorax. There is widespread airspace consolidation throughout the right lung. There is moderate underlying interstitial edema. Heart is mildly enlarged with pulmonary vascularity within normal limits. No evident adenopathy. IMPRESSION: Tube positions as described without pneumothorax. Evidence of underlying congestive heart failure. Probable multifocal pneumonia on the right superimposed on edema, although the opacity on the right could be due to alveolar edema in a somewhat atypical distribution. Both pneumonia and alveolar edema may present concurrently. Electronically Signed   By: Lowella Grip III M.D.   On: 05/02/2016 10:08      Indwelling Urinary Catheter continued, requirement due to   Reason to continue Indwelling Urinary Catheter for strict Intake/Output monitoring for hemodynamic instability   Central Line continued, requirement due to   Reason to continue [employer] Monitoring of central venous pressure or other hemodynamic parameters   Ventilator continued, requirement due to, resp failure    Ventilator Sedation RASS 0 to -2   Cultures: BCx2  UC  Sputum  Antibiotics: Defepime 11/7 Vanc 11/7> Lines:  Studies 12/2014> outpatient echocardiogram  NORMAL LEFT VENTRICULAR SYSTOLIC FUNCTION WITH AN ESTIMATED EF = 60 % NORMAL RIGHT VENTRICULAR SYSTOLIC FUNCTION MODERATE TRICUSPID AND MITRAL VALVE INSUFFICIENCY SEVERE AORTIC VALVE STENOSIS WITH A CALCULATED AVA = 0.49 cm^2 SEVERE SEPTAL HYPERTROPHY MILD LA ENLARGEMENT   DISCUSSION 65 year old female past medical history of severe aortic stenosis, OSA/OHS, morbid obesity, diabetes, hyperlipidemia, now with acute respiratory failure  ASSESSMENT/PLAN    PULMONARY Acute respiratory failure Severe aortic stenosis Mechanical ventilator dependence OSA, OHS Bilateral  interstitial edema versus multifocal pneumonia Mixed metabolic and Respiratory acidosis Lactic acidosis  P:   -Continue mechanical ventilation, wean as tolerated -Keep O2 saturations greater than 88% -I have reviewed her chest x-ray, there is some component of interstitial edema along with findings of multifocal pneumonia, given her acute respiratory failure, pulmonary embolism cannot be completely ruled out. -CTA chest with contrast ordered, will follow-up -Check pro calcitonin level -Check blood and urine cultures -Continue  with current supportive respiratory measures -Trend lactic acid levels  CARDIOVASCULAR Severe aortic stenosis Hypertension Probable pulmonary hypertension Elevated troponin P:  - Fluid balance will be of great concern -Cardiology consult -Continue with mild to moderate afterload reduction -Avoid significant preload reduction -Pressors to maintain MAP>65 -Trend troponin -Continue antibiotics, cefepime/vancomycin - SAT/SBT daily  RENAL Acute kidney injury Elevated creatinine  P:   -Gentle fluid hydration -Avoid nephrotoxic drugs -ICU Electrolyte replacement  GASTROINTESTINAL PUD  HEMATOLOGIC F\u CBC  INFECTIOUS - cont with abx - f\u cultures   ENDOCRINE DM  NEUROLOGIC A:  Metabolic encephalopathy P:   RASS goal: -1 to 0 - Continue with current sedation protocol as patient is on mechanical ventilation  Social - and has no children or living parents, closest relative are cousins. Rayetta Humphrey is her cousin at the bedside, and states that she is healthcare power of attorney, will bring in further documentation for this. She also stated that patient was to have any further cardiac events and his current condition, then patient would want any further aggressive measures, and requested DO NOT RESUSCITATE  I have personally obtained a history, examined the patient, evaluated laboratory and imaging results, formulated the assessment and plan and  placed orders.  The Patient requires high complexity decision making for assessment and support, frequent evaluation and titration of therapies, application of advanced monitoring technologies and extensive interpretation of multiple databases. Critical Care Time devoted to patient care services described in this note is 55 minutes.   Overall, patient is critically ill, prognosis is guarded. Patient at high risk for cardiac arrest and death.   Vilinda Boehringer, MD [facility] Pager [phone] (please enter 7-digits) On Call Pager [phone] (please enter 7-digits)     05/02/2016, 11:51 AM  Note: This note was prepared with Dragon dictation along with smaller phrase technology. Any transcriptional errors that result from this process are unintentional.

## 2016-05-03 ENCOUNTER — Inpatient Hospital Stay: Payer: Medicare Other

## 2016-05-03 DIAGNOSIS — R7881 Bacteremia: Secondary | ICD-10-CM

## 2016-05-03 DIAGNOSIS — I214 Non-ST elevation (NSTEMI) myocardial infarction: Secondary | ICD-10-CM

## 2016-05-03 LAB — BLOOD GAS, ARTERIAL
Acid-Base Excess: 0.9 mmol/L (ref 0.0–2.0)
Bicarbonate: 25.2 mmol/L (ref 20.0–28.0)
FIO2: 0.35
MECHANICAL RATE: 16
O2 Saturation: 97.5 %
PATIENT TEMPERATURE: 37
PEEP: 5 cmH2O
PO2 ART: 94 mmHg (ref 83.0–108.0)
VT: 450 mL
pCO2 arterial: 38 mmHg (ref 32.0–48.0)
pH, Arterial: 7.43 (ref 7.350–7.450)

## 2016-05-03 LAB — ECHOCARDIOGRAM COMPLETE
HEIGHTINCHES: 65 in
Weight: 5008 oz

## 2016-05-03 LAB — CBC
HCT: 35.5 % (ref 35.0–47.0)
Hemoglobin: 11.4 g/dL — ABNORMAL LOW (ref 12.0–16.0)
MCH: 26.9 pg (ref 26.0–34.0)
MCHC: 32.2 g/dL (ref 32.0–36.0)
MCV: 83.6 fL (ref 80.0–100.0)
PLATELETS: 190 10*3/uL (ref 150–440)
RBC: 4.25 MIL/uL (ref 3.80–5.20)
RDW: 15.6 % — AB (ref 11.5–14.5)
WBC: 16 10*3/uL — ABNORMAL HIGH (ref 3.6–11.0)

## 2016-05-03 LAB — MAGNESIUM
Magnesium: 1.9 mg/dL (ref 1.7–2.4)
Magnesium: 1.9 mg/dL (ref 1.7–2.4)

## 2016-05-03 LAB — PROCALCITONIN: Procalcitonin: 37.89 ng/mL

## 2016-05-03 LAB — GLUCOSE, CAPILLARY
GLUCOSE-CAPILLARY: 222 mg/dL — AB (ref 65–99)
GLUCOSE-CAPILLARY: 185 mg/dL — AB (ref 65–99)
GLUCOSE-CAPILLARY: 183 mg/dL — AB (ref 65–99)
Glucose-Capillary: 215 mg/dL — ABNORMAL HIGH (ref 65–99)
Glucose-Capillary: 164 mg/dL — ABNORMAL HIGH (ref 65–99)

## 2016-05-03 LAB — BASIC METABOLIC PANEL
Anion gap: 9 (ref 5–15)
BUN: 31 mg/dL — AB (ref 6–20)
CALCIUM: 8 mg/dL — AB (ref 8.9–10.3)
CO2: 25 mmol/L (ref 22–32)
Chloride: 104 mmol/L (ref 101–111)
Creatinine, Ser: 1.3 mg/dL — ABNORMAL HIGH (ref 0.44–1.00)
GFR calc Af Amer: 49 mL/min — ABNORMAL LOW (ref >60)
GFR, EST NON AFRICAN AMERICAN: 42 mL/min — AB (ref >60)
GLUCOSE: 225 mg/dL — AB (ref 65–99)
Potassium: 3.7 mmol/L (ref 3.5–5.1)
Sodium: 138 mmol/L (ref 135–145)

## 2016-05-03 LAB — TROPONIN I: Troponin I: >65 ng/mL (ref <0.03)

## 2016-05-03 LAB — PHOSPHORUS: Phosphorus: 3.3 mg/dL (ref 2.5–4.6)

## 2016-05-03 MED ORDER — INSULIN ASPART 100 UNIT/ML ~~LOC~~ SOLN
0.0000 [IU] | Freq: Three times a day (TID) | SUBCUTANEOUS | Status: DC
  Administered 2016-05-03: 7 [IU] via SUBCUTANEOUS
  Administered 2016-05-04: 4 [IU] via SUBCUTANEOUS
  Administered 2016-05-04: 7 [IU] via SUBCUTANEOUS
  Administered 2016-05-04 – 2016-05-06 (×5): 4 [IU] via SUBCUTANEOUS
  Administered 2016-05-06: 7 [IU] via SUBCUTANEOUS
  Administered 2016-05-06 – 2016-05-07 (×3): 4 [IU] via SUBCUTANEOUS
  Administered 2016-05-07: 7 [IU] via SUBCUTANEOUS
  Administered 2016-05-08 (×2): 4 [IU] via SUBCUTANEOUS
  Administered 2016-05-08: 7 [IU] via SUBCUTANEOUS
  Administered 2016-05-09 (×3): 4 [IU] via SUBCUTANEOUS
  Filled 2016-05-03 (×5): qty 4
  Filled 2016-05-03: qty 7
  Filled 2016-05-03 (×3): qty 4
  Filled 2016-05-03: qty 7
  Filled 2016-05-03 (×3): qty 4
  Filled 2016-05-03: qty 7
  Filled 2016-05-03 (×2): qty 4
  Filled 2016-05-03: qty 7
  Filled 2016-05-03: qty 4

## 2016-05-03 MED ORDER — SODIUM CHLORIDE 0.9% FLUSH
10.0000 mL | INTRAVENOUS | Status: DC | PRN
Start: 2016-05-03 — End: 2016-05-09

## 2016-05-03 MED ORDER — ASPIRIN EC 81 MG PO TBEC
81.0000 mg | DELAYED_RELEASE_TABLET | Freq: Every day | ORAL | Status: DC
  Administered 2016-05-04 – 2016-05-09 (×6): 81 mg via ORAL
  Filled 2016-05-03 (×6): qty 1

## 2016-05-03 MED ORDER — ASPIRIN 81 MG PO CHEW
324.0000 mg | CHEWABLE_TABLET | Freq: Once | ORAL | Status: AC
  Administered 2016-05-03: 324 mg via ORAL

## 2016-05-03 MED ORDER — INSULIN ASPART 100 UNIT/ML ~~LOC~~ SOLN
0.0000 [IU] | Freq: Every day | SUBCUTANEOUS | Status: DC
  Administered 2016-05-05 – 2016-05-08 (×3): 2 [IU] via SUBCUTANEOUS
  Filled 2016-05-03 (×3): qty 2

## 2016-05-03 MED ORDER — PNEUMOCOCCAL VAC POLYVALENT 25 MCG/0.5ML IJ INJ
0.5000 mL | INJECTION | INTRAMUSCULAR | Status: AC
  Administered 2016-05-04: 0.5 mL via INTRAMUSCULAR
  Filled 2016-05-03: qty 0.5

## 2016-05-03 MED ORDER — ATORVASTATIN CALCIUM 20 MG PO TABS
40.0000 mg | ORAL_TABLET | Freq: Every day | ORAL | Status: DC
  Administered 2016-05-04 – 2016-05-09 (×6): 40 mg via ORAL
  Filled 2016-05-03 (×6): qty 2

## 2016-05-03 MED ORDER — ORAL CARE MOUTH RINSE
15.0000 mL | Freq: Two times a day (BID) | OROMUCOSAL | Status: DC
  Administered 2016-05-03 – 2016-05-08 (×10): 15 mL via OROMUCOSAL

## 2016-05-03 MED ORDER — CEFAZOLIN SODIUM-DEXTROSE 2-4 GM/100ML-% IV SOLN
2.0000 g | Freq: Three times a day (TID) | INTRAVENOUS | Status: DC
  Administered 2016-05-03 – 2016-05-04 (×2): 2 g via INTRAVENOUS
  Filled 2016-05-03 (×4): qty 100

## 2016-05-03 MED ORDER — SODIUM CHLORIDE 0.9% FLUSH
10.0000 mL | Freq: Two times a day (BID) | INTRAVENOUS | Status: DC
  Administered 2016-05-03: 30 mL
  Administered 2016-05-03 – 2016-05-04 (×2): 10 mL
  Administered 2016-05-04: 20 mL
  Administered 2016-05-05: 10 mL
  Administered 2016-05-05: 20 mL
  Administered 2016-05-06 – 2016-05-07 (×2): 10 mL
  Administered 2016-05-07: 30 mL
  Administered 2016-05-08 – 2016-05-09 (×3): 10 mL

## 2016-05-03 MED ORDER — INFLUENZA VAC SPLIT QUAD 0.5 ML IM SUSY
0.5000 mL | PREFILLED_SYRINGE | INTRAMUSCULAR | Status: AC
  Administered 2016-05-04: 0.5 mL via INTRAMUSCULAR
  Filled 2016-05-03: qty 0.5

## 2016-05-03 NOTE — Progress Notes (Signed)
Update: Patient reevaluated this afternoon. Extubated this morning. Currently in no acute story distress, no use of accessory muscles. Stable hemodynamics, and now eating.  Spoke with hospitalist, they will take onto the service tomorrow morning. Case discussed with Dr. Lavetta Nielsen.   Vilinda Boehringer, MD [facility] Pager [phone] (please enter 7-digits) On Call Pager - [phone] (please enter 7-digits)

## 2016-05-03 NOTE — Progress Notes (Signed)
Accept patient on behalf of hospitalist - will start rounding in AM Briefly 65yF Admit 05/02/16 - flash pulmonary edema related to NSTEMI - intubated in ED  Extubated 11/8 without difficulty [facility] cardio following  NSTEMI Respiratory failure Severe aortic stenosis

## 2016-05-03 NOTE — Progress Notes (Signed)
Inpatient Diabetes Program Recommendations  AACE/ADA: New Consensus Statement on Inpatient Glycemic Control (2015)  Target Ranges:  Prepandial:   less than 140 mg/dL      Peak postprandial:   less than 180 mg/dL (1-2 hours)      Critically ill patients:  140 - 180 mg/dL   Results for OSCAR, FORMAN (MRN [id number]) as of 05/03/2016 10:26  Ref. Range 05/02/2016 16:24 05/02/2016 20:36 05/02/2016 23:57 05/03/2016 04:01 05/03/2016 07:46  Glucose-Capillary Latest Ref Range: 65 - 99 mg/dL 212 (H) 240 (H) 220 (H) 222 (H) 185 (H)    Review of Glycemic Control  Diabetes history: DM2, Morbid Obesity, CHF, DNR Outpatient Diabetes medications: Lantus 50-54 Units into the skin daily at 10 pm. Titrate for morning sugars less than 120, Metformin 1000 mg daily Current orders for Inpatient glycemic control: NPO, Novolog 2-6 units Q4H   Inpatient Diabetes Program Recommendations: Please consider:  Per ADA recommendations "consider performing an A1C on all patients with diabetes or hyperglycemia admitted to the hospital if not performed in the prior 3 months".  NURSING:  Pt is on ICU glycemic control order set phase 1, so if pt has 1 CBG>250 or 2 subsequent CBG's>200, please transition to phase 2 per protocol.  Thank you,  Windy Carina, RN, BSN Diabetes Coordinator Inpatient Diabetes Program [phone] (Team Pager)

## 2016-05-03 NOTE — Progress Notes (Signed)
Initial Nutrition Assessment  DOCUMENTATION CODES:   Morbid obesity  INTERVENTION:  -If unable to extubate within 24-48 hours, recommend initiation of Adult Tube Feeding Protocol. Recommended goal rate of Vital High Protein is 70 ml/hr providing 148 g protein, 1680 kcals, 1411 mL of free water. Noted pt on diprivan for sedation, goal rate of TF may need to be ajdusted on follow-up based on calories from diprivan to prevent overfeeding. Pt is appropriate for PEPuP as well -Recommend abdominal xray prior to initiation of TF to confirm correct tube placement   NUTRITION DIAGNOSIS:   Inadequate oral intake related to acute illness as evidenced by NPO status.  GOAL:   Provide needs based on ASPEN/SCCM guidelines  MONITOR:   Vent status, Labs, Weight trends  REASON FOR ASSESSMENT:   Consult Assessment of nutrition requirement/status  ASSESSMENT:   65 yo female admitted with acute respiratory failure requiring intubation in ER on 11/7. Pt with hx of severe aortic stenosis, OSA, morbid obesity, DM and HL  Pt currently on vent support. OG tube in place Labs and meds reviewed.  NS at 75 ml/hr, ss novolog, diprivan Glucose Profile:   Recent Labs  05/02/16 2357 05/03/16 0401 05/03/16 0746  GLUCAP 220* 222* 185*     Diet Order:  Diet NPO time specified  Skin:   (stage I buttock)  Last BM:  no documented BM  Height:   Ht Readings from Last 1 Encounters:  05/02/16 5\' 9"  (1.753 m)    Weight:   Wt Readings from Last 1 Encounters:  05/02/16 (!) 313 lb 4.4 oz (142.1 kg)    BMI:  Body mass index is 46.26 kg/m.  Estimated Nutritional Needs:   Kcal:  2111-5520 kcals  Protein:  132-165 g  Fluid:  >/= 2 L  EDUCATION NEEDS:   No education needs identified at this time  [place], [place], LDN [phone] Pager  [phone] Weekend/On-Call Pager

## 2016-05-03 NOTE — Consult Note (Signed)
[facility] Nurse wound consult note Reason for Consult:Chronic venous insufficiency with Unna boots placed weekly.  Due to be changed yesterday.  Respiratory failure improving, will replace compression today.  Wound type:Chronic venous insufficiency with nonintact blister to left anterior lower leg.  Pressure Ulcer POA: N/A Measurement: 2 cm x 1.5 cm x 0.1 cm  Wound XFP:KGYB and moist Drainage (amount, consistency, odor) Minimal serosanguinous drainage  No odor Periwound:Edema, chronic skin changes Dressing procedure/placement/frequency:Cleanse bilateral lower legs with soap and water and pat gently dry.  Apply calcium alginate to ruptured blister on left leg.  Wrap with zinc layer and secure with self adherent COban layer.  Change weekly on Wednesday.  [facility] team will follow.  Domenic Moras RN BSN [place] Pager [phone]

## 2016-05-03 NOTE — Progress Notes (Addendum)
PULMONARY / CRITICAL CARE MEDICINE   Name: [name] MRN: [id number] DOB: [birth year]    ADMISSION DATE:  05/02/2016 CONSULTATION DATE: 05/02/16  REFERRING MD:  Dr. Jimmye Norman  CHIEF COMPLAINT:  Respiratory failure  Patient Profile  65 year old female past medical history of morbid obesity,OSA/OHS, hypertension, diabetes, severe aortic stenosis, presents in respiratory failure today requiring emergent intubation in the ER. History per chart review and cousin who was at the bedside, cousin stated that she spoke to patient last night who was in her normal state of health. However, this morning when the nursing aide went to visit patient she was noted to be in severe respiratory distress, desaturating on her CPAP machine, she was emergently brought to the ER and was not improving, noted to have significant desaturations in the 70s and low 80s, she was emergently intubated. Labs in the ER significant for mixed metabolic and respiratory acidosis, pH 7.16/CO2 62, lactic acid 7.1, white cell count 29.6, troponin 0.14, BNP 286 Off note, her cousin states that she has been following with her cardiologist routinely, she has been offered surgical options for severe aortic stenosis, but patient has refused. Cousin of the bedside believe she is a healthcare power of attorney and stated that the patient was to have any further cardiac events on current aggressive life-saving measures, then she is to be a DO NOT RESUSCITATE   PAST MEDICAL HISTORY :  She  has a past medical history of CHF (congestive heart failure) ([place]); Diabetes mellitus without complication ([place]); and Hypertension.  PAST SURGICAL HISTORY: She  has no past surgical history on file.  Allergies  Allergen Reactions  . Lisinopril     Other reaction(s): Unknown  . Niacin     Other reaction(s): Unknown  . Sitagliptin     Other reaction(s): Unknown  . Sulfamethoxazole-Trimethoprim     Other reaction(s): Unknown  . Tetanus Toxoids      Other reaction(s): Unknown    No current facility-administered medications on file prior to encounter.    No current outpatient prescriptions on file prior to encounter.    FAMILY HISTORY:  Her has no family status information on file.    SOCIAL HISTORY: She  reports that she has never smoked. She has never used smokeless tobacco. She reports that she does not drink alcohol or use drugs.  REVIEW OF SYSTEMS:   Unable to obtain  SUBJECTIVE:  Inc trop, started on therapeutic lovenox  VITAL SIGNS: BP (!) 89/62   Pulse 88   Temp 98.4 F (36.9 C) (Axillary)   Resp (!) 25   Ht 5\' 9"  (1.753 m)   Wt (!) 142.1 kg (313 lb 4.4 oz)   SpO2 96%   BMI 46.26 kg/m   HEMODYNAMICS:    VENTILATOR SETTINGS: Vent Mode: PRVC FiO2 (%):  [35 %-100 %] 35 % Set Rate:  [16 bmp] 16 bmp Vt Set:  [450 mL] 450 mL PEEP:  [5 cmH20-10 cmH20] 5 cmH20  INTAKE / OUTPUT: I/O last 3 completed shifts: In: 185.5 [I.V.:135.5; IV Piggyback:50] Out: 350 [Urine:350]  PHYSICAL EXAMINATION: General:  Morbidly obese, sickly appearing white female Neuro:opens eyes to voice, does not follow any commands HEENT:  Atraumatic, normocephalic,no discharge, no JVD appreciated Cardiovascular:  I0,X7,DZ, holosystolic murmur Lungs:Diminished , no wheezes, crackles, rhonchi noted Abdomen: obese, round , non distended, active bowel sounds Musculoskeletal:  No inflammation/deformity noted Skin:  Grossly intact  LABS:  BMET  Recent Labs Lab 05/02/16 0922 05/02/16 1405  NA 137 138  K  4.2 4.3  CL 103 105  CO2 17* 27  BUN 33* 35*  CREATININE 1.36* 1.49*  GLUCOSE 402* 220*    Electrolytes  Recent Labs Lab 05/02/16 0922 05/02/16 1405  CALCIUM 8.7* 8.0*  MG  --  1.8  PHOS  --  3.4    CBC  Recent Labs Lab 05/02/16 0922  WBC 29.6*  HGB 12.5  HCT 38.8  PLT 333    Coag's No results for input(s): APTT, INR in the last 168 hours.  Sepsis Markers  Recent Labs Lab 05/02/16 0922  05/02/16 1250 05/02/16 1420  LATICACIDVEN 7.1* 2.7* 2.0*  PROCALCITON  --  20.11  --     ABG  Recent Labs Lab 05/02/16 0958 05/02/16 1117 05/02/16 1700  PHART 7.16* 7.22* 7.39  PCO2ART 62* 61* 41  PO2ART 68* 79* 63*    Liver Enzymes  Recent Labs Lab 05/02/16 0922 05/02/16 1405  AST 33 147*  ALT 17 27  ALKPHOS 81 65  BILITOT 0.7 0.8  ALBUMIN 3.7 3.2*    Cardiac Enzymes  Recent Labs Lab 05/02/16 0922 05/02/16 1250 05/02/16 1814  TROPONINI 0.14* 9.38* 55.29*    Glucose  Recent Labs Lab 05/02/16 1318 05/02/16 1415 05/02/16 1624 05/02/16 2036 05/02/16 2357  GLUCAP 228* 179* 212* 240* 220*    Imaging Dg Abdomen 1 View  Result Date: 05/02/2016 CLINICAL DATA:  Hypoxia. EXAM: ABDOMEN - 1 VIEW COMPARISON:  No recent prior. FINDINGS: NG tube noted coiled stomach. Soft tissue structures are unremarkable air-filled loops of colon and possibly small bowel noted. Mild adynamic ileus cannot be excluded. No free air. Nodular bilateral pulmonary infiltrates noted, reference made to today's chest x-ray report. IMPRESSION: NG tube noted coiled stomach. Mild adynamic ileus cannot be excluded . Electronically Signed   By: Marcello Moores  Register   On: 05/02/2016 10:04   Ct Angio Chest Pe W And/or Wo Contrast  Result Date: 05/02/2016 CLINICAL DATA:  Hypoxia and tachycardia EXAM: CT ANGIOGRAPHY CHEST WITH CONTRAST TECHNIQUE: Multidetector CT imaging of the chest was performed using the standard protocol during bolus administration of intravenous contrast. Multiplanar CT image reconstructions and MIPs were obtained to evaluate the vascular anatomy. CONTRAST:  60 mL Isovue 370 nonionic COMPARISON:  Chest radiograph May 02, 2016 FINDINGS: Cardiovascular: There is no demonstrable pulmonary embolus. The ascending thoracic aortic diameter is 4.3 x 4.2 cm. There is no thoracic aortic dissection. The visualized great vessels appear unremarkable. There is cardiomegaly. The pericardium is not  appreciably thickened. There is rather minimal coronary artery calcification evident. Mediastinum/Nodes: Visualized thyroid appears unremarkable. There are scattered sub cm mediastinal lymph nodes but no adenopathy evident. Endotracheal tube is present with the tip in the distal trachea. Nasogastric tube extends into the stomach. Lungs/Pleura: There is widespread interstitial and alveolar edema throughout the lungs. There is airspace consolidation in both lower lobes with small pleural effusions bilaterally. Upper Abdomen: Visualized upper abdominal structures appear unremarkable. Note that there is a degree of fatty infiltration in the pancreas. Musculoskeletal: There are no blastic or lytic bone lesions. Review of the MIP images confirms the above findings. IMPRESSION: No demonstrable pulmonary embolus. Ascending thoracic aorta measures 4.3 x 4.2 cm. Recommend annual imaging followup by CTA or MRA. This recommendation follows 2010 ACCF/AHA/AATS/ACR/ASA/SCA/SCAI/SIR/STS/SVM Guidelines for the Diagnosis and Management of Patients with Thoracic Aortic Disease. Circulation. 2010; 121: J884-Z660. No thoracic aortic dissection. Extensive interstitial and alveolar edema with bibasilar consolidation and small pleural effusions. Suspect congestive heart failure, although superimposed pneumonia and/ or ARDS may  be present. More than one of these entities may be present concurrently. Heart is enlarged. There is rather minimal coronary artery calcification evident. Patient is intubated. Nasogastric tube tip in stomach. No pneumothorax. Electronically Signed   By: Lowella Grip III M.D.   On: 05/02/2016 12:21   Dg Chest Portable 1 View  Result Date: 05/02/2016 CLINICAL DATA:  Status post central line placement EXAM: PORTABLE CHEST 1 VIEW COMPARISON:  Portable chest X ray of 9:37 a.m. of today's date. FINDINGS: There has been interval placement of right internal jugular venous catheter. The tip of the catheter projects  over the distal third of the SVC. The endotracheal tube tip lies 2.9 cm above the carina. The esophagogastric tube tip projects below the inferior margin of the image. There are fluffy alveolar opacities bilaterally which are more conspicuous today. The lungs are mildly hypoinflated. The cardiac silhouette is enlarged. The pulmonary vascularity is engorged. IMPRESSION: Right internal jugular venous catheter tip projecting over the distal SVC. No postprocedure complication following placement. Worsening alveolar opacities especially on the left consistent with increasing alveolar edema or pneumonia. Electronically Signed   By: David  Martinique M.D.   On: 05/02/2016 13:47   Dg Chest Port 1 View  Result Date: 05/02/2016 CLINICAL DATA:  Hypoxia EXAM: PORTABLE CHEST 1 VIEW COMPARISON:  None. FINDINGS: Endotracheal tube tip is 3.1 cm above the carina. Nasogastric tube tip and side port are below the diaphragm. No pneumothorax. There is widespread airspace consolidation throughout the right lung. There is moderate underlying interstitial edema. Heart is mildly enlarged with pulmonary vascularity within normal limits. No evident adenopathy. IMPRESSION: Tube positions as described without pneumothorax. Evidence of underlying congestive heart failure. Probable multifocal pneumonia on the right superimposed on edema, although the opacity on the right could be due to alveolar edema in a somewhat atypical distribution. Both pneumonia and alveolar edema may present concurrently. Electronically Signed   By: Lowella Grip III M.D.   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  LINES/TUBES: 11/7 right IJ>>  DISCUSSION: 65 year old female past medical history of severe aortic stenosis, OSA/OHS, morbid obesity, diabetes, hyperlipidemia, now with acute respiratory failure  ASSESSMENT / PLAN:  PULMONARY Acute respiratory failure Severe aortic stenosis Mechanical ventilator dependence OSA, OHS Bilateral interstitial edema versus multifocal pneumonia Lactic acidosis- trending down  P:   -Continue mechanical ventilation, wean as tolerated -Keep O2 saturations greater than 88% -SBT Trial when warranted -CTA chest Negative for PE -Procalcitonin -20.11(11/7) - routine ABG -Routine CXR   CARDIOVASCULAR A:  CARDIOVASCULAR Severe aortic stenosis  H x of Hypertension Probable pulmonary hypertension Elevated troponin- worsening troponin P:  Continuous Telemetry -Cardiology consult -Lovenox for ACS - Pressors to maintain a MAP goal >65  -F/U ECHO RENAL A:   Acute Kidney Injury P:   Gentle hydration Avoid Nephrotoxic drugs Replace electrolytes per ICU protocol GASTROINTESTINAL A:   No active issues P:   Famotidine for GIP  HEMATOLOGIC A:   No active issues P:  Lovenox for DVT prophylaxis  INFECTIOUS A:   Bactremia P:   Continue Penicillin Monitor  fever curve Follow cultures   ENDOCRINE A:   Diabetes Melitus P:   BS checks every 4 hours  NEUROLOGIC A:   Acute encephalopathy P:   rass goal 0 to -1 Patient requires sedation as the patient is on ventilator    Bincy Varughese,AG-ACNP Pulmonary and [place]   05/03/2016, 2:51 AM  STAFF NOTE: I, Dr. Vilinda Boehringer have personally reviewed patient's available data, including medical history, events of note, physical examination and test results as part of my evaluation. I have discussed with NP Varughese  and other  care providers such as pharmacist, RN and RRT.    S: Noted to have continued elevated troponin levels, started on therapeutic Lovenox overnight. This morning moderate agitation when weaning down sedation with tachypnea and tachycardia and rapid shallow breathing.  A: 65 year old female past medical history of severe aortic stenosis, OSA/OHS, or obesity, diabetes, hyperlipidemia, hypertension, now with NSTEMI and acute respiratory failure  Acute respiratory failure Severe aortic stenosis Mechanical ventilator dependence OSA, OHS Bilateral interstitial edema versus multifocal pneumonia Lactic acidosis- trending down NSTEMI Staph aureus bacteremia  P:  - Continue with mechanical ventilation, wean as tolerated -SAT/SBT daily - therapeutic lovenox - cardiology following - not a surgical candidate at this time for valve replacement, patient did not want this in any event.  - f/u echo - TF today - cont with current antibiotics. Repeat bld cx if fever or worsening clinical status.    Marland Kitchen  Rest per NP/medical resident whose note is outlined above and that I agree with  The patient is critically ill with multiple organ systems failure and requires high complexity decision making for assessment and support, frequent evaluation and titration of therapies, application of advanced monitoring technologies and extensive interpretation of multiple databases.   Critical Care Time devoted to patient care services described in this note is  35 Minutes.   This time reflects time of care of this signee Dr Vilinda Boehringer.  This critical care time does not reflect procedure time, or teaching time or supervisory time of PA/NP/Med-student/Med Resident etc but could involve care discussion time.  Vilinda Boehringer, MD [place] [facility] Critical [facility] Pager [phone] (please enter 7-digits) On Call Pager [phone] (please enter 7-digits)  Note: This note was prepared with Dragon dictation along with  smaller phrase technology. Any transcriptional errors that result from this process are unintentional.

## 2016-05-03 NOTE — Progress Notes (Signed)
[place] for enoxaparin dosing and electrolyte management in this 38 yoF with ACS/NSTEMI and acute respiratory failure.  Goal of Therapy:  Resolution of NSTEMI Monitor platelets by anticoagulation protocol: Yes Electrolytes WNL   Plan: Enoxaparin: Will continue enoxaparin 140 mg q 12 hours based on total body weight. CrCl = 65.8. Will follow along with cardio.  Electrolytes: WNL. Will continue to monitor and replace as needed.   Allergies  Allergen Reactions  . Lisinopril     Other reaction(s): Unknown  . Niacin     Other reaction(s): Unknown  . Sitagliptin     Other reaction(s): Unknown  . Sulfamethoxazole-Trimethoprim     Other reaction(s): Unknown  . Tetanus Toxoids     Other reaction(s): Unknown    Patient Measurements: Height: 5\' 9"  (175.3 cm) Weight: (!) 313 lb 4.4 oz (142.1 kg) IBW/kg (Calculated) : 66.2 Heparin Dosing Weight:   Vital Signs: Temp: 99.1 F (37.3 C) (11/08 1200) Temp Source: Axillary (11/08 0807) BP: 118/75 (11/08 1400) Pulse Rate: 109 (11/08 1400)  Labs:  Recent Labs  05/02/16 0922 05/02/16 1250 05/02/16 1405 05/02/16 1814 05/03/16 0210 05/03/16 0408  HGB 12.5  --   --   --   --  11.4*  HCT 38.8  --   --   --   --  35.5  PLT 333  --   --   --   --  190  CREATININE 1.36*  --  1.49*  --   --  1.30*  TROPONINI 0.14* 9.38*  --  55.29* >65.00*  --     Estimated Creatinine Clearance: 65.8 mL/min (by C-G formula based on SCr of 1.3 mg/dL (H)).   Medical History: Past Medical History:  Diagnosis Date  . CHF (congestive heart failure) ([place])   . Diabetes mellitus without complication ([place])   . Hypertension     Medications:  Prescriptions Prior to Admission  Medication Sig Dispense Refill Last Dose  . amLODipine (NORVASC) 5 MG tablet Take 1 tablet by mouth daily.   unknown at unknown  . cloNIDine (CATAPRES) 0.1 MG tablet Take 1 tablet by mouth 2 (two) times daily.   unknown at unknown  . fenofibrate (TRICOR)  145 MG tablet Take 1 tablet by mouth daily.   unknown at unknown  . furosemide (LASIX) 40 MG tablet Take 1 tablet by mouth daily.   unknown at unknown  . LANTUS SOLOSTAR 100 UNIT/ML Solostar Pen Inject 50-54 Units into the skin daily at 10 pm. Titrate for morning sugars less than 120   unknown at unknown  . losartan (COZAAR) 100 MG tablet Take 1 tablet by mouth daily.   unknown at unknown  . metFORMIN (GLUCOPHAGE) 1000 MG tablet Take 1 tablet by mouth 2 (two) times daily.   unknown at unknown  . nystatin (MYCOSTATIN/NYSTOP) powder Apply topically 4 (four) times daily.    unknown at unknown    Darrow Bussing, PharmD Pharmacy Resident 05/03/2016 4:03 PM

## 2016-05-03 NOTE — Progress Notes (Signed)
Pt. Extubated and placed on 4 liters nasal cannula.No resp. Distress noted at this time

## 2016-05-03 NOTE — Progress Notes (Signed)
Hinton Dyer NP notified of 62 beats of Vtach. See new orders.

## 2016-05-04 LAB — LEGIONELLA PNEUMOPHILA SEROGP 1 UR AG: L. pneumophila Serogp 1 Ur Ag: NEGATIVE

## 2016-05-04 LAB — BASIC METABOLIC PANEL
ANION GAP: 8 (ref 5–15)
BUN: 18 mg/dL (ref 6–20)
CALCIUM: 7.9 mg/dL — AB (ref 8.9–10.3)
CO2: 25 mmol/L (ref 22–32)
Chloride: 106 mmol/L (ref 101–111)
Creatinine, Ser: 0.99 mg/dL (ref 0.44–1.00)
GFR calc Af Amer: >60 mL/min (ref >60)
GFR, EST NON AFRICAN AMERICAN: 59 mL/min — AB (ref >60)
GLUCOSE: 210 mg/dL — AB (ref 65–99)
Potassium: 3.7 mmol/L (ref 3.5–5.1)
Sodium: 139 mmol/L (ref 135–145)

## 2016-05-04 LAB — GLUCOSE, CAPILLARY
GLUCOSE-CAPILLARY: 173 mg/dL — AB (ref 65–99)
Glucose-Capillary: 208 mg/dL — ABNORMAL HIGH (ref 65–99)
Glucose-Capillary: 186 mg/dL — ABNORMAL HIGH (ref 65–99)
Glucose-Capillary: 163 mg/dL — ABNORMAL HIGH (ref 65–99)

## 2016-05-04 LAB — CULTURE, BLOOD (ROUTINE X 2)

## 2016-05-04 LAB — URINE CULTURE: Culture: >=100000 — AB

## 2016-05-04 LAB — LIPID PANEL
CHOL/HDL RATIO: 2.3 ratio
CHOLESTEROL: 86 mg/dL (ref 0–200)
HDL: 38 mg/dL — ABNORMAL LOW (ref >40)
LDL CALC: 27 mg/dL (ref 0–99)
TRIGLYCERIDES: 105 mg/dL (ref <150)
VLDL: 21 mg/dL (ref 0–40)

## 2016-05-04 LAB — PROCALCITONIN: Procalcitonin: 19.62 ng/mL

## 2016-05-04 MED ORDER — FENOFIBRATE 54 MG PO TABS
54.0000 mg | ORAL_TABLET | Freq: Every day | ORAL | Status: DC
  Administered 2016-05-04 – 2016-05-09 (×6): 54 mg via ORAL
  Filled 2016-05-04 (×7): qty 1

## 2016-05-04 MED ORDER — FUROSEMIDE 40 MG PO TABS
40.0000 mg | ORAL_TABLET | Freq: Every day | ORAL | Status: DC
  Administered 2016-05-04 – 2016-05-08 (×5): 40 mg via ORAL
  Filled 2016-05-04 (×5): qty 1

## 2016-05-04 MED ORDER — CEPHALEXIN 500 MG PO CAPS
1000.0000 mg | ORAL_CAPSULE | Freq: Four times a day (QID) | ORAL | Status: DC
  Administered 2016-05-04 – 2016-05-09 (×22): 1000 mg via ORAL
  Filled 2016-05-04 (×22): qty 2

## 2016-05-04 MED ORDER — INSULIN GLARGINE 100 UNIT/ML ~~LOC~~ SOLN
20.0000 [IU] | Freq: Every day | SUBCUTANEOUS | Status: DC
  Administered 2016-05-04 – 2016-05-07 (×4): 20 [IU] via SUBCUTANEOUS
  Filled 2016-05-04 (×6): qty 0.2

## 2016-05-04 MED ORDER — METOPROLOL SUCCINATE ER 25 MG PO TB24
25.0000 mg | ORAL_TABLET | Freq: Every day | ORAL | Status: DC
  Administered 2016-05-04 – 2016-05-09 (×6): 25 mg via ORAL
  Filled 2016-05-04 (×6): qty 1

## 2016-05-04 NOTE — Progress Notes (Signed)
ICU Speculator for enoxaparin dosing and electrolyte management in this 65 yo female with ACS/NSTEMI and acute respiratory failure. Patient s/p extubation and NSTEMI is being medically managed.    Goal of Therapy:  Resolution of NSTEMI Monitor platelets by anticoagulation protocol: Yes Electrolytes WNL   Plan: Enoxaparin: Will continue enoxaparin 140 mg q 12 hours based on total body weight.   Electrolytes: No replacement warranted at this time. Will continue to monitor and replace as needed.   Allergies  Allergen Reactions  . Lisinopril     Other reaction(s): Unknown  . Niacin     Other reaction(s): Unknown  . Sitagliptin     Other reaction(s): Unknown  . Sulfamethoxazole-Trimethoprim     Other reaction(s): Unknown  . Tetanus Toxoids     Other reaction(s): Unknown    Patient Measurements: Height: 5\' 9"  (175.3 cm) Weight: (!) 316 lb 5.8 oz (143.5 kg) IBW/kg (Calculated) : 66.2  Vital Signs: Temp: 97.9 F (36.6 C) (11/09 1200) Temp Source: Oral (11/09 1200) BP: 112/75 (11/09 1500) Pulse Rate: 109 (11/09 1516)  Labs:  Recent Labs  05/02/16 0922 05/02/16 1250 05/02/16 1405 05/02/16 1814 05/03/16 0210 05/03/16 0408 05/04/16 0551  HGB 12.5  --   --   --   --  11.4*  --   HCT 38.8  --   --   --   --  35.5  --   PLT 333  --   --   --   --  190  --   CREATININE 1.36*  --  1.49*  --   --  1.30* 0.99  TROPONINI 0.14* 9.38*  --  55.29* >65.00*  --   --     Estimated Creatinine Clearance: 86.8 mL/min (by C-G formula based on SCr of 0.99 mg/dL).   Medical History: Past Medical History:  Diagnosis Date  . CHF (congestive heart failure) ([place])   . Diabetes mellitus without complication ([place])   . Hypertension     Medications:  Prescriptions Prior to Admission  Medication Sig Dispense Refill Last Dose  . amLODipine (NORVASC) 5 MG tablet Take 1 tablet by mouth daily.   unknown at unknown  . cloNIDine (CATAPRES) 0.1 MG tablet Take 1 tablet by  mouth 2 (two) times daily.   unknown at unknown  . fenofibrate (TRICOR) 145 MG tablet Take 1 tablet by mouth daily.   unknown at unknown  . furosemide (LASIX) 40 MG tablet Take 1 tablet by mouth daily.   unknown at unknown  . LANTUS SOLOSTAR 100 UNIT/ML Solostar Pen Inject 50-54 Units into the skin daily at 10 pm. Titrate for morning sugars less than 120   unknown at unknown  . losartan (COZAAR) 100 MG tablet Take 1 tablet by mouth daily.   unknown at unknown  . metFORMIN (GLUCOPHAGE) 1000 MG tablet Take 1 tablet by mouth 2 (two) times daily.   unknown at unknown  . nystatin (MYCOSTATIN/NYSTOP) powder Apply topically 4 (four) times daily.    unknown at unknown    MLS 05/04/2016 4:09 PM

## 2016-05-04 NOTE — Plan of Care (Signed)
Problem: Skin Integrity: Goal: Risk for impaired skin integrity will decrease Outcome: Progressing Skin integrity impairment decreased by turning and repositioning q2h.  Low air mattress in use.  Pink foam pad in place on sacrum.  Bilateral heels elevated off of bed on pillows.  Skin assessment reveals no new skin breakdown or compromised skin integrity.  BLE una boots in place and intact.

## 2016-05-04 NOTE — Progress Notes (Signed)
Per verbal order from Dr. Posey Pronto, let patient eat lunch and then start on bipap prn and bedtime. Order placed. Patient currently eating lunch and tolerating well. Per order from Dr. Stevenson Clinch d/c central line after another IV access is gained. 2 unsuccessful attempts from this nurse and 1 from another nurse, will cont to reassess.  Wilnette Kales

## 2016-05-04 NOTE — Progress Notes (Signed)
Inpatient Diabetes Program Recommendations  AACE/ADA: New Consensus Statement on Inpatient Glycemic Control (2015)  Target Ranges:  Prepandial:   less than 140 mg/dL      Peak postprandial:   less than 180 mg/dL (1-2 hours)      Critically ill patients:  140 - 180 mg/dL  Results for [name], PURK (MRN [id number]) as of 05/04/2016 11:49  Ref. Range 05/03/2016 07:46 05/03/2016 11:17 05/03/2016 16:27 05/03/2016 21:09 05/04/2016 07:40  Glucose-Capillary Latest Ref Range: 65 - 99 mg/dL 185 (H) 183 (H) 215 (H) 164 (H) 208 (H)    Review of Glycemic Control  Diabetes history: DM2 Outpatient Diabetes medications: Lantus 50-54 units QHS, Metformin 1000 mg BID Current orders for Inpatient glycemic control: Novolog 0-20 units TID with meals, Novolog 0-5 units QHS  Inpatient Diabetes Program Recommendations: Insulin - Basal: Please consider ordering low dose basal insulin. Recommend starting with Lantus 10 units Q24H starting now.  Thanks, Barnie Alderman, RN, MSN, CDE Diabetes Coordinator Inpatient Diabetes Program [phone] (Team Pager from 8am to 5pm)

## 2016-05-04 NOTE — Plan of Care (Signed)
Problem: ICU Phase Progression Outcomes Goal: Pain controlled with appropriate interventions Outcome: Completed/Met Date Met: 05/04/16 Good pain control with po Tylenol as ordered.

## 2016-05-04 NOTE — Progress Notes (Signed)
Patient had 10 beat run of vtach, asymptomatic and vss. Hinton Dyer, NP notified. No new orders currently. [name]

## 2016-05-04 NOTE — Plan of Care (Signed)
Problem: ICU Phase Progression Outcomes Goal: O2 sats trending toward baseline Outcome: Progressing Currently on O2 4L [place], maintaining SaO2>90%.

## 2016-05-04 NOTE — Progress Notes (Addendum)
[facility] NAME: [name]    MR#:  [id number]  DATE OF BIRTH:  [birth year]  SUBJECTIVE:  Came in with severe respiratory distress  REVIEW OF SYSTEMS:   Review of Systems  Constitutional: Negative for chills, fever and weight loss.  HENT: Negative for ear discharge, ear pain and nosebleeds.   Eyes: Negative for blurred vision, pain and discharge.  Respiratory: Positive for shortness of breath. Negative for sputum production, wheezing and stridor.   Cardiovascular: Positive for chest pain and leg swelling. Negative for palpitations, orthopnea and PND.  Gastrointestinal: Negative for abdominal pain, diarrhea, nausea and vomiting.  Genitourinary: Negative for frequency and urgency.  Musculoskeletal: Negative for back pain and joint pain.  Neurological: Positive for weakness. Negative for sensory change, speech change and focal weakness.  Psychiatric/Behavioral: Negative for depression and hallucinations. The patient is not nervous/anxious.    Tolerating Diet:some Tolerating PT: Pending  DRUG ALLERGIES:   Allergies  Allergen Reactions  . Lisinopril     Other reaction(s): Unknown  . Niacin     Other reaction(s): Unknown  . Sitagliptin     Other reaction(s): Unknown  . Sulfamethoxazole-Trimethoprim     Other reaction(s): Unknown  . Tetanus Toxoids     Other reaction(s): Unknown    VITALS:  Blood pressure 104/73, pulse (!) 102, temperature 99.4 F (37.4 C), temperature source Oral, resp. rate (!) 27, height 5\' 9"  (1.753 m), weight (!) 143.5 kg (316 lb 5.8 oz), SpO2 92 %.  PHYSICAL EXAMINATION:   Physical Exam  GENERAL:  65 y.o.-year-old patient lying in the bed with no acute distress. Severe obesity EYES: Pupils equal, round, reactive to light and accommodation. No scleral icterus. Extraocular muscles intact.  HEENT: Head atraumatic, normocephalic. Oropharynx and nasopharynx clear.  NECK:  Supple, no jugular  venous distention. No thyroid enlargement, no tenderness.  LUNGS: Normal breath sounds bilaterally, no wheezing, rales, rhonchi. No use of accessory muscles of respiration.  CARDIOVASCULAR: S1, S2 normal. No murmurs, rubs, or gallops.  ABDOMEN: Soft, nontender, nondistended. Bowel sounds present. No organomegaly or mass.  EXTREMITIES: No cyanosis, clubbing  -  Chronic bilateral leg edma with venous stasis changes  NEUROLOGIC: Cranial nerves II through XII are intact. No focal Motor or sensory deficits b/l.   PSYCHIATRIC:  patient is alert and oriented x 3.  SKIN: No obvious rash, lesion, or ulcer.   LABORATORY PANEL:  CBC  Recent Labs Lab 05/03/16 0408  WBC 16.0*  HGB 11.4*  HCT 35.5  PLT 190    Chemistries   Recent Labs Lab 05/02/16 1405  05/03/16 1801 05/04/16 0551  NA 138  < >  --  139  K 4.3  < >  --  3.7  CL 105  < >  --  106  CO2 27  < >  --  25  GLUCOSE 220*  < >  --  210*  BUN 35*  < >  --  18  CREATININE 1.49*  < >  --  0.99  CALCIUM 8.0*  < >  --  7.9*  MG 1.8  < > 1.9  --   AST 147*  --   --   --   ALT 27  --   --   --   ALKPHOS 65  --   --   --   BILITOT 0.8  --   --   --   < > = values in this interval  not displayed. Cardiac Enzymes  Recent Labs Lab 05/03/16 0210  TROPONINI >65.00*   RADIOLOGY:  Dg Chest Port 1 View  Result Date: 05/03/2016 CLINICAL DATA:  Respiratory failure. EXAM: PORTABLE CHEST 1 VIEW COMPARISON:  05/02/2016 FINDINGS: Endotracheal tube tip measures 2.1 cm above the carina. Right central venous catheter tip over the cavoatrial junction. Enteric tube tip is off the field of view but below the left hemidiaphragm. Cardiac enlargement with mild vascular congestion. Improving perihilar infiltrates since previous study. No blunting of costophrenic angles. No pneumothorax. IMPRESSION: Appliances appear in satisfactory position. Improving perihilar infiltrates since previous study. Electronically Signed   By: Lucienne Capers M.D.   On:  05/03/2016 06:29   ASSESSMENT AND PLAN:  65 year old female past medical history of morbid obesity,OSA/OHS,hypertension, diabetes, severe aortic stenosis, presents in respiratory failure requiring emergent intubation in the ER. patient  was noted to be in severe respiratory distress, desaturating on her CPAP machine, she was emergently brought to the ER and was not improving, noted to have significant desaturations in the 70s and low 80s, she was emergently intubated.  1 Acute sever hypoxic respiratory failure due to flash pulmonary edema in the setting of CHF/OSA and critical AS -pt now extubated -currently on 4 liter [place]---wean as tolerated. Pt will need to assessed for home oxygen -CTA chest Negative for PE -Procalcitonin -20.11(11/7)  2.Severe aortic stenosis, H x of Hypertension Probable pulmonary hypertension Elevated troponin- worsening troponin -Cardiology consult with dr Josefa Half noted -Lovenox for ACS - Pressors to maintain a MAP goal >65   3.Acute Kidney Injury-resolved Gentle hydration Avoid Nephrotoxic drugs Replace electrolytes per ICU protocol  4.Lovenox for DVT prophylaxis  5. Group B streptococcus infection -Continue high dose keflex Monitor fever curve   6.Diabetes Mellitus  on lantus 20 units  and SSI -d/c metformin  6. Possible OSA -does not have CPAP at home -will consider using BIPAP at nite Pt will need sleep study as out pt  Transfer to 2A D/w pt's cousin [name] in the room  Case discussed with Care Management/Social Worker. Management plans discussed with the patient, family and they are in agreement.  CODE STATUS full DVT Prophylaxis: lovnoex TOTAL criticalTIME TAKING CARE OF THIS PATIENT: 52minutes.  >50% time spent on counselling and coordination of care  POSSIBLE D/C IN2-3 DAYS, DEPENDING ON CLINICAL CONDITION.  Note: This dictation was prepared with Dragon dictation along with smaller phrase technology. Any transcriptional errors  that result from this process are unintentional.  Jariah Tarkowski M.D on 05/04/2016 at 6:36 PM  Between 7am to 6pm - Pager - [phone]  After 6pm go to [email or web address] - password [email or web address] [facility]  [facility]  Office  [phone]  CC: Primary care physician; Cephus Richer, MD

## 2016-05-04 NOTE — Progress Notes (Signed)
[facility]  SUBJECTIVE: I don't have chest pain   Vitals:   05/04/16 0500 05/04/16 0600 05/04/16 0604 05/04/16 0720  BP: 125/81 123/80    Pulse: (!) 105 (!) 107    Resp: (!) 29 (!) 28    Temp:      TempSrc:      SpO2: 91% 91%  91%  Weight:   (!) 143.5 kg (316 lb 5.8 oz)   Height:         Intake/Output Summary (Last 24 hours) at 05/04/16 0916 Last data filed at 05/04/16 0604  Gross per 24 hour  Intake             2690 ml  Output             2800 ml  Net             -110 ml      PHYSICAL EXAM  General: Well developed, well nourished, in no acute distress HEENT:  Normocephalic and atramatic Neck:  No JVD.  Lungs: Clear bilaterally to auscultation and percussion. Heart: HRRR . Normal S1 and S2 without gallops or murmurs.  Abdomen: Bowel sounds are positive, abdomen soft and non-tender  Msk:  Back normal, normal gait. Normal strength and tone for age. Extremities: No clubbing, cyanosis or edema.   Neuro: Alert and oriented X 3. Psych:  Good affect, responds appropriately   LABS: Basic Metabolic Panel:  Recent Labs  05/02/16 1405 05/03/16 0408 05/03/16 0413 05/03/16 1801 05/04/16 0551  NA 138 138  --   --  139  K 4.3 3.7  --   --  3.7  CL 105 104  --   --  106  CO2 27 25  --   --  25  GLUCOSE 220* 225*  --   --  210*  BUN 35* 31*  --   --  18  CREATININE 1.49* 1.30*  --   --  0.99  CALCIUM 8.0* 8.0*  --   --  7.9*  MG 1.8  --  1.9 1.9  --   PHOS 3.4  --  3.3  --   --    Liver Function Tests:  Recent Labs  05/02/16 0922 05/02/16 1405  AST 33 147*  ALT 17 27  ALKPHOS 81 65  BILITOT 0.7 0.8  PROT 7.6 6.4*  ALBUMIN 3.7 3.2*   No results for input(s): LIPASE, AMYLASE in the last 72 hours. CBC:  Recent Labs  05/02/16 0922 05/03/16 0408  WBC 29.6* 16.0*  NEUTROABS 27.2*  --   HGB 12.5 11.4*  HCT 38.8 35.5  MCV 85.2 83.6  PLT 333 190   Cardiac Enzymes:  Recent Labs  05/02/16 1250 05/02/16 1814 05/03/16 0210  TROPONINI 9.38* 55.29*  >65.00*   BNP: Invalid input(s): POCBNP D-Dimer: No results for input(s): DDIMER in the last 72 hours. Hemoglobin A1C: No results for input(s): HGBA1C in the last 72 hours. Fasting Lipid Panel:  Recent Labs  05/02/16 0922  TRIG 87   Thyroid Function Tests: No results for input(s): TSH, T4TOTAL, T3FREE, THYROIDAB in the last 72 hours.  Invalid input(s): FREET3 Anemia Panel: No results for input(s): VITAMINB12, FOLATE, FERRITIN, TIBC, IRON, RETICCTPCT in the last 72 hours.  Dg Abdomen 1 View  Result Date: 05/02/2016 CLINICAL DATA:  Hypoxia. EXAM: ABDOMEN - 1 VIEW COMPARISON:  No recent prior. FINDINGS: NG tube noted coiled stomach. Soft tissue structures are unremarkable air-filled loops of colon and possibly small bowel noted. Mild  adynamic ileus cannot be excluded. No free air. Nodular bilateral pulmonary infiltrates noted, reference made to today's chest x-ray report. IMPRESSION: NG tube noted coiled stomach. Mild adynamic ileus cannot be excluded . Electronically Signed   By: Marcello Moores  Register   On: 05/02/2016 10:04   Ct Angio Chest Pe W And/or Wo Contrast  Result Date: 05/02/2016 CLINICAL DATA:  Hypoxia and tachycardia EXAM: CT ANGIOGRAPHY CHEST WITH CONTRAST TECHNIQUE: Multidetector CT imaging of the chest was performed using the standard protocol during bolus administration of intravenous contrast. Multiplanar CT image reconstructions and MIPs were obtained to evaluate the vascular anatomy. CONTRAST:  60 mL Isovue 370 nonionic COMPARISON:  Chest radiograph May 02, 2016 FINDINGS: Cardiovascular: There is no demonstrable pulmonary embolus. The ascending thoracic aortic diameter is 4.3 x 4.2 cm. There is no thoracic aortic dissection. The visualized great vessels appear unremarkable. There is cardiomegaly. The pericardium is not appreciably thickened. There is rather minimal coronary artery calcification evident. Mediastinum/Nodes: Visualized thyroid appears unremarkable. There are  scattered sub cm mediastinal lymph nodes but no adenopathy evident. Endotracheal tube is present with the tip in the distal trachea. Nasogastric tube extends into the stomach. Lungs/Pleura: There is widespread interstitial and alveolar edema throughout the lungs. There is airspace consolidation in both lower lobes with small pleural effusions bilaterally. Upper Abdomen: Visualized upper abdominal structures appear unremarkable. Note that there is a degree of fatty infiltration in the pancreas. Musculoskeletal: There are no blastic or lytic bone lesions. Review of the MIP images confirms the above findings. IMPRESSION: No demonstrable pulmonary embolus. Ascending thoracic aorta measures 4.3 x 4.2 cm. Recommend annual imaging followup by CTA or MRA. This recommendation follows 2010 ACCF/AHA/AATS/ACR/ASA/SCA/SCAI/SIR/STS/SVM Guidelines for the Diagnosis and Management of Patients with Thoracic Aortic Disease. Circulation. 2010; 121: U314-H702. No thoracic aortic dissection. Extensive interstitial and alveolar edema with bibasilar consolidation and small pleural effusions. Suspect congestive heart failure, although superimposed pneumonia and/ or ARDS may be present. More than one of these entities may be present concurrently. Heart is enlarged. There is rather minimal coronary artery calcification evident. Patient is intubated. Nasogastric tube tip in stomach. No pneumothorax. Electronically Signed   By: Lowella Grip III M.D.   On: 05/02/2016 12:21   Dg Chest Port 1 View  Result Date: 05/03/2016 CLINICAL DATA:  Respiratory failure. EXAM: PORTABLE CHEST 1 VIEW COMPARISON:  05/02/2016 FINDINGS: Endotracheal tube tip measures 2.1 cm above the carina. Right central venous catheter tip over the cavoatrial junction. Enteric tube tip is off the field of view but below the left hemidiaphragm. Cardiac enlargement with mild vascular congestion. Improving perihilar infiltrates since previous study. No blunting of  costophrenic angles. No pneumothorax. IMPRESSION: Appliances appear in satisfactory position. Improving perihilar infiltrates since previous study. Electronically Signed   By: Lucienne Capers M.D.   On: 05/03/2016 06:29   Dg Chest Portable 1 View  Result Date: 05/02/2016 CLINICAL DATA:  Status post central line placement EXAM: PORTABLE CHEST 1 VIEW COMPARISON:  Portable chest X ray of 9:37 a.m. of today's date. FINDINGS: There has been interval placement of right internal jugular venous catheter. The tip of the catheter projects over the distal third of the SVC. The endotracheal tube tip lies 2.9 cm above the carina. The esophagogastric tube tip projects below the inferior margin of the image. There are fluffy alveolar opacities bilaterally which are more conspicuous today. The lungs are mildly hypoinflated. The cardiac silhouette is enlarged. The pulmonary vascularity is engorged. IMPRESSION: Right internal jugular venous catheter tip projecting over  the distal SVC. No postprocedure complication following placement. Worsening alveolar opacities especially on the left consistent with increasing alveolar edema or pneumonia. Electronically Signed   By: David  Martinique M.D.   On: 05/02/2016 13:47   Dg Chest Port 1 View  Result Date: 05/02/2016 CLINICAL DATA:  Hypoxia EXAM: PORTABLE CHEST 1 VIEW COMPARISON:  None. FINDINGS: Endotracheal tube tip is 3.1 cm above the carina. Nasogastric tube tip and side port are below the diaphragm. No pneumothorax. There is widespread airspace consolidation throughout the right lung. There is moderate underlying interstitial edema. Heart is mildly enlarged with pulmonary vascularity within normal limits. No evident adenopathy. IMPRESSION: Tube positions as described without pneumothorax. Evidence of underlying congestive heart failure. Probable multifocal pneumonia on the right superimposed on edema, although the opacity on the right could be due to alveolar edema in a somewhat  atypical distribution. Both pneumonia and alveolar edema may present concurrently. Electronically Signed   By: Lowella Grip III M.D.   On: 05/02/2016 10:08     Echo LV EF 50-55% with critical aortic stenosis, calculated valve area 0.54 cm  TELEMETRY: Sinus tachycardia:  ASSESSMENT AND PLAN:  Active Problems:   Acute respiratory failure with hypoxia and hypercapnia (HCC)   Pressure injury of skin    1. Respiratory failure, likely multifactorial, secondary to pneumonia/sepsis, and CHF, extubated, appears to be doing well 2. Non-STEMI, in the setting of respiratory failure/pneumonia/sepsis/CHF, currently without chest pain, not a candidate for cardiac catheterization this patient has been reluctant to undergo valve replacement 3. Critical aortic stenosis, previously reluctant to undergo aortic valve replacement surgery  Recommendations  1. Continue conservative management 2. Start metoprolol succinate 25 mg daily 3. Start lisinopril 5 mg daily 4. Defer cardiac catheterization     Isaias Cowman, MD, PhD, [facility] 05/04/2016 9:16 AM

## 2016-05-04 NOTE — Care Management (Signed)
I have confirmed with [name] (cousin) that patient does not have any O2, CPAP or BiPAP machine at home.

## 2016-05-05 LAB — HEMOGLOBIN A1C
HEMOGLOBIN A1C: 6.3 % — AB (ref 4.8–5.6)
MEAN PLASMA GLUCOSE: 134 mg/dL

## 2016-05-05 LAB — GLUCOSE, CAPILLARY
GLUCOSE-CAPILLARY: 151 mg/dL — AB (ref 65–99)
GLUCOSE-CAPILLARY: 195 mg/dL — AB (ref 65–99)
GLUCOSE-CAPILLARY: 187 mg/dL — AB (ref 65–99)
GLUCOSE-CAPILLARY: 203 mg/dL — AB (ref 65–99)

## 2016-05-05 MED ORDER — ENOXAPARIN SODIUM 40 MG/0.4ML ~~LOC~~ SOLN: 40.0000 mg | SUBCUTANEOUS | Status: DC

## 2016-05-05 MED ORDER — ENOXAPARIN SODIUM 40 MG/0.4ML ~~LOC~~ SOLN
40.0000 mg | SUBCUTANEOUS | Status: DC
  Administered 2016-05-06 – 2016-05-08 (×3): 40 mg via SUBCUTANEOUS
  Filled 2016-05-05 (×3): qty 0.4

## 2016-05-05 NOTE — Progress Notes (Signed)
[facility]  SUBJECTIVE: I don't have chest pain   Vitals:   05/05/16 0300 05/05/16 0400 05/05/16 0500 05/05/16 0600  BP: 106/67 99/76 105/75 (!) 120/99  Pulse: 96 93 88 94  Resp: (!) 35 (!) 26 (!) 24 (!) 24  Temp:   98.3 F (36.8 C)   TempSrc:      SpO2: 97% 97% 96% 94%  Weight:   (!) 138.3 kg (304 lb 14.3 oz)   Height:         Intake/Output Summary (Last 24 hours) at 05/05/16 9622 Last data filed at 05/05/16 0600  Gross per 24 hour  Intake            40.51 ml  Output             2150 ml  Net         -2109.49 ml      PHYSICAL EXAM  General: Well developed, well nourished, in no acute distress HEENT:  Normocephalic and atramatic Neck:  No JVD.  Lungs: Clear bilaterally to auscultation and percussion. Heart: HRRR . Normal S1 and S2 ,Grade 2 to 3/6 crescendo decrescendo murmur Abdomen: Bowel sounds are positive, abdomen soft and non-tender  Msk:  Back normal, normal gait. Normal strength and tone for age. Extremities: No clubbing, cyanosis or edema.   Neuro: Alert and oriented X 3. Psych:  Good affect, responds appropriately   LABS: Basic Metabolic Panel:  Recent Labs  05/02/16 1405 05/03/16 0408 05/03/16 0413 05/03/16 1801 05/04/16 0551  NA 138 138  --   --  139  K 4.3 3.7  --   --  3.7  CL 105 104  --   --  106  CO2 27 25  --   --  25  GLUCOSE 220* 225*  --   --  210*  BUN 35* 31*  --   --  18  CREATININE 1.49* 1.30*  --   --  0.99  CALCIUM 8.0* 8.0*  --   --  7.9*  MG 1.8  --  1.9 1.9  --   PHOS 3.4  --  3.3  --   --    Liver Function Tests:  Recent Labs  05/02/16 0922 05/02/16 1405  AST 33 147*  ALT 17 27  ALKPHOS 81 65  BILITOT 0.7 0.8  PROT 7.6 6.4*  ALBUMIN 3.7 3.2*   No results for input(s): LIPASE, AMYLASE in the last 72 hours. CBC:  Recent Labs  05/02/16 0922 05/03/16 0408  WBC 29.6* 16.0*  NEUTROABS 27.2*  --   HGB 12.5 11.4*  HCT 38.8 35.5  MCV 85.2 83.6  PLT 333 190   Cardiac Enzymes:  Recent Labs   05/02/16 1250 05/02/16 1814 05/03/16 0210  TROPONINI 9.38* 55.29* >65.00*   BNP: Invalid input(s): POCBNP D-Dimer: No results for input(s): DDIMER in the last 72 hours. Hemoglobin A1C:  Recent Labs  05/04/16 0520  HGBA1C 6.3*   Fasting Lipid Panel:  Recent Labs  05/04/16 0551  CHOL 86  HDL 38*  LDLCALC 27  TRIG 105  CHOLHDL 2.3   Thyroid Function Tests: No results for input(s): TSH, T4TOTAL, T3FREE, THYROIDAB in the last 72 hours.  Invalid input(s): FREET3 Anemia Panel: No results for input(s): VITAMINB12, FOLATE, FERRITIN, TIBC, IRON, RETICCTPCT in the last 72 hours.  No results found.   Echo Normal left ventricular function with LVEF 5055%, with critical aortic stenosis with calculated aortic valve area 0.54 cm  TELEMETRY: Sinus tachycardia:  ASSESSMENT AND PLAN:  Active Problems:   Acute respiratory failure with hypoxia and hypercapnia (HCC)   Pressure injury of skin    1. Respiratory failure, multifactorial, extubated, gradually improving 2. Non-STEMI, in the setting of respiratory failure/pneumonia/sepsis/CHF, currently without chest pain 3. Known critical aortic stenosis, previously reluctant to undergo aortic valve replacement surgery  Recommendations  1. Agree with overall current therapy 2. Continue conservative management 3. Defer cardiac catheterization at this time, patient has been reluctant to undergo aortic valve replacement surgery in the past, due to her morbid obesity, poor general health, she would be a poor candidate for invasive cardiac evaluation and potential aortic valve replacement and bypass surgery. The patient currently is a DO NOT RESUSCITATE.  Signed off for now, please call if any questions   Isaias Cowman, MD, PhD, [facility] 05/05/2016 8:03 AM

## 2016-05-05 NOTE — Plan of Care (Signed)
Problem: Activity: Goal: Risk for activity intolerance will decrease Outcome: Progressing Working with PT today. Will see their note for recommendations.

## 2016-05-05 NOTE — Progress Notes (Signed)
Patient transferred to rm 256 from ICU. Patient resting, no distress. Focused assessment as charted. Skin checked with Caryl Pina RN and tele box verified with CCMD and Velna Hatchet RN. Family at bedside. Bed alarm on. Will continue to monitor.

## 2016-05-05 NOTE — Progress Notes (Signed)
Rehab Admissions Coordinator Note:  Patient was screened by Cleatrice Burke for appropriateness for an Inpatient Acute Rehab Consult per PT recommendation.  At this time, we are recommending I will follo pt's progress with therapy. Currently not at a level for inpt rehab intensity of therapy 3 hrs per day. Recommend OT eval and I will follow up on MOnday.  Cleatrice Burke 05/05/2016, 5:21 PM  I can be reached at [phone].

## 2016-05-05 NOTE — Evaluation (Signed)
Physical Therapy Evaluation Patient Details Name: [name] MRN: [id number] DOB: 06-Feb-1951 Today's Date: 05/05/2016   History of Present Illness  Pt is a 65 y/o F who presented in respiratory failure requiring emergent intubation in the ER.  Admitting dx: Acute sever hypoxic respiratory failure due to flash pulmonary edema in the setting of CHF/OSA, critical AS, and NSTEMI.  CTA chest negative for PE.  Pt was extubated on 11/8.  Pt's PMH includes CHF, morbid obesity.    Clinical Impression  Pt admitted with above diagnosis. Pt currently with functional limitations due to the deficits listed below (see PT Problem List). [name] presents with generalized weakness and currently requires mod +2 assist for sit<>stand x2.  Pt unable to achieve pivot to chair today due to BLE fatigue after standing ~10 seconds. Pt reports RUE has felt more weak with mild numbness R forearm and impaired finger to nose compared to PTA but is improving. She is from home where she lives alone but has aides who come in the morning who assist with bathing, cleaning.  Pt reports her last fall was more than 10 years ago and that PTA she was ambulating without AD in her home and using a cane when she leaves her home for doctor appointments.  Pt is very motivated and anticipate that with intensive therapy she could reach her PLOF.  Recommending CIR.    Follow Up Recommendations CIR    Equipment Recommendations  Other (comment) (Bariatric RW)    Recommendations for Other Services OT consult     Precautions / Restrictions Precautions Precautions: Other (comment);Fall Precaution Comments: monitor O2 Restrictions Weight Bearing Restrictions: No      Mobility  Bed Mobility Overal bed mobility: Needs Assistance;+2 for physical assistance Bed Mobility: Supine to Sit;Sit to Supine     Supine to sit: Mod assist;+2 for physical assistance;HOB elevated Sit to supine: Max assist;+2 for physical assistance    General bed mobility comments: Pt requires assist to elevate trunk with increased time and effort and increased WOB.   Transfers Overall transfer level: Needs assistance Equipment used: Rolling walker (2 wheeled) Transfers: Sit to/from Stand Sit to Stand: Mod assist;+2 physical assistance         General transfer comment: +2 mod assist to boost up to standing and to steady once standing.  Attempted this x2 and pt stood for ~10 seconds each time before reporting her LEs felt weak and she needed to sit.    Ambulation/Gait             General Gait Details: unable to assess  Stairs            Wheelchair Mobility    Modified Rankin (Stroke Patients Only)       Balance Overall balance assessment: Needs assistance Sitting-balance support: Feet supported;Single extremity supported Sitting balance-Leahy Scale: Poor Sitting balance - Comments: Pt must have at least 1 UE supported while sitting EOB   Standing balance support: Bilateral upper extremity supported;During functional activity Standing balance-Leahy Scale: Poor Standing balance comment: Relies on RW and physical assist to steady                             Pertinent Vitals/Pain Pain Assessment: No/denies pain    Home Living Family/patient expects to be discharged to:: Private residence Living Arrangements: Alone Available Help at Discharge: Family;Available PRN/intermittently Type of Home: Apartment Home Access: Stairs to enter Entrance Stairs-Rails: None Entrance Stairs-Number of  Steps: 2 (1 and 1) Home Layout: One level Home Equipment: Cane - single point;Shower seat      Prior Function Level of Independence: Needs assistance   Gait / Transfers Assistance Needed: Pt couch surfs inside home and uses cane when outside home.  Pt denies any falls in the past 10 years.  ADL's / Homemaking Assistance Needed: Pt not driving, [name] her cousin takes her to her doctor appointments.  Pt has an  aide that comes M-F 8:30-11am and assists with bathing, cleaning.  Pt says she eats microwave dinners as she has a hard time cooking. Pt dresses independently.        Hand Dominance   Dominant Hand: Right    Extremity/Trunk Assessment   Upper Extremity Assessment: RUE deficits/detail;LUE deficits/detail RUE Deficits / Details: Strength grossl 4-/5, reports RUE has felt more weak than PTA but is improving.  Reports mild numbness R forearm.    RUE Sensation: decreased light touch LUE Deficits / Details: strength grossly 4/5   Lower Extremity Assessment: RLE deficits/detail;LLE deficits/detail RLE Deficits / Details: strength grossly 3-/5 LLE Deficits / Details: strength grossly 3-/5     Communication   Communication: No difficulties  Cognition Arousal/Alertness: Awake/alert Behavior During Therapy: WFL for tasks assessed/performed Overall Cognitive Status: Within Functional Limits for tasks assessed                      General Comments General comments (skin integrity, edema, etc.): SpO2 remains at or above 90% on 4L O2 throughout session.    Exercises General Exercises - Upper Extremity Shoulder Flexion: AROM;Both;10 reps;Supine General Exercises - Lower Extremity Ankle Circles/Pumps: AROM;Both;10 reps;Supine Quad Sets: Strengthening;Both;10 reps;Supine Gluteal Sets: Strengthening;Both;10 reps;Supine Hip ABduction/ADduction: AAROM;Both;10 reps;Supine;Strengthening Straight Leg Raises: AAROM;Both;10 reps;Supine;Strengthening   Assessment/Plan    PT Assessment Patient needs continued PT services  PT Problem List Decreased strength;Decreased activity tolerance;Decreased mobility;Decreased balance;Decreased knowledge of use of DME;Decreased safety awareness;Cardiopulmonary status limiting activity;Obesity;Impaired sensation          PT Treatment Interventions DME instruction;Gait training;Functional mobility training;Therapeutic activities;Therapeutic  exercise;Balance training;Stair training;Neuromuscular re-education;Patient/family education;Other (comment) (energy conservation techniques)    PT Goals (Current goals can be found in the Care Plan section)  Acute Rehab PT Goals Patient Stated Goal: to get stronger PT Goal Formulation: With patient Time For Goal Achievement: 05/19/16 Potential to Achieve Goals: Good    Frequency Min 2X/week   Barriers to discharge Decreased caregiver support Lives alone    Co-evaluation               End of Session Equipment Utilized During Treatment: Gait belt;Oxygen Activity Tolerance: Patient limited by fatigue Patient left: in bed;with call bell/phone within reach;with bed alarm set Nurse Communication: Mobility status;Other (comment) (SpO2)         Time: 7106-2694 PT Time Calculation (min) (ACUTE ONLY): 53 min   Charges:   PT Evaluation $PT Eval Moderate Complexity: 1 Procedure PT Treatments $Therapeutic Exercise: 8-22 mins $Therapeutic Activity: 8-22 mins   PT G Codes:        Collie Siad PT, DPT 05/05/2016, 4:51 PM

## 2016-05-05 NOTE — Plan of Care (Signed)
Problem: Phase I Progression Outcomes Goal: Hemodynamically stable Outcome: Progressing Tolerating nasal canula, VSS at this time. Will continue to monitor.

## 2016-05-05 NOTE — Progress Notes (Addendum)
Initial Nutrition Assessment  DOCUMENTATION CODES:   Morbid obesity  INTERVENTION:  -Cater to pt preferences; if po intake inadequate on follow-up, pt may benefit from nutritional supplement. Appetite and po intake good at present  NUTRITION DIAGNOSIS:   Inadequate oral intake related to acute illness as evidenced by NPO status.  Improving as diet advanced post extubation and pt eating 75-100% of meal trays  GOAL:   Patient will meet greater than or equal to 90% of their needs  MONITOR:   PO intake, Labs, Weight trends  REASON FOR ASSESSMENT:   Consult Assessment of nutrition requirement/status  ASSESSMENT:   65 yo female admitted with acute respiratory failure requiring intubation in ER on 11/7. Pt with hx of severe aortic stenosis, OSA, morbid obesity, DM and HL  Extubated 11/8 Diet advanced to Soft 11/9, recorded po intake 75-100% of meals Labs: reviewed Meds: ss novlog, lantus, lasix  Diet Order:  DIET SOFT Room service appropriate? Yes; Fluid consistency: Thin  Skin:   (stage I buttock)  Last BM:  no documented BM  Height:   Ht Readings from Last 1 Encounters:  05/02/16 5\' 9"  (1.753 m)    Weight:   Wt Readings from Last 1 Encounters:  05/05/16 (!) 304 lb 14.3 oz (138.3 kg)    Filed Weights   05/02/16 1400 05/04/16 0604 05/05/16 0500  Weight: (!) 313 lb 4.4 oz (142.1 kg) (!) 316 lb 5.8 oz (143.5 kg) (!) 304 lb 14.3 oz (138.3 kg)    BMI:  Body mass index is 45.03 kg/m.  Estimated Nutritional Needs:   Kcal:  2000-2200 kcals   Protein:  125-140 g  Fluid:  >/= 1.5 L  EDUCATION NEEDS:   No education needs identified at this time  [place], [place], [place] [phone] Pager  [phone] Weekend/On-Call Pager

## 2016-05-05 NOTE — Progress Notes (Signed)
Per Dr. Posey Pronto d/c foley catheter and place orders for patient to transfer to telemetry. Orders placed. Dr. Posey Pronto also notified that after 3 more unsuccessful attempts by 2 separate nurses, still unable to obtain another IV access. Per Dr. Posey Pronto okay to keep central line today, and will reassess need to discontinue tomorrow. All lumens flush well with blood return and dressing dry and intact.  Wilnette Kales

## 2016-05-05 NOTE — Progress Notes (Signed)
Chaplain rounded the unit and provided a compassionate presence and support for the patient.  Chaplain provided silent prayer.  The patient appeared to be asleep.  [name] [phone]

## 2016-05-05 NOTE — Progress Notes (Signed)
Patient transferred to rm 256. Report given to [facility], RN. Telemetry boxed verified with RN. CCMD and Elink notified of transfer.  [name]

## 2016-05-06 LAB — GLUCOSE, CAPILLARY
GLUCOSE-CAPILLARY: 189 mg/dL — AB (ref 65–99)
GLUCOSE-CAPILLARY: 211 mg/dL — AB (ref 65–99)
GLUCOSE-CAPILLARY: 206 mg/dL — AB (ref 65–99)

## 2016-05-06 LAB — BASIC METABOLIC PANEL
ANION GAP: 6 (ref 5–15)
BUN: 23 mg/dL — ABNORMAL HIGH (ref 6–20)
CALCIUM: 7.9 mg/dL — AB (ref 8.9–10.3)
CO2: 28 mmol/L (ref 22–32)
Chloride: 108 mmol/L (ref 101–111)
Creatinine, Ser: 0.87 mg/dL (ref 0.44–1.00)
Glucose, Bld: 193 mg/dL — ABNORMAL HIGH (ref 65–99)
POTASSIUM: 3.4 mmol/L — AB (ref 3.5–5.1)
SODIUM: 142 mmol/L (ref 135–145)

## 2016-05-06 MED ORDER — POTASSIUM CHLORIDE CRYS ER 20 MEQ PO TBCR
40.0000 meq | EXTENDED_RELEASE_TABLET | Freq: Once | ORAL | Status: AC
  Administered 2016-05-06: 40 meq via ORAL
  Filled 2016-05-06: qty 2

## 2016-05-06 NOTE — Progress Notes (Signed)
Patient denied any  pain at this time. No acute respiratory distress noted. Patient has been resting the whole night with her eyes closed, respirations even and unlabored.  Compliance with her BIPAP. Will continue to monitor.

## 2016-05-06 NOTE — Progress Notes (Addendum)
[place] at [place] NAME: [name]    MR#:  [id number]  DATE OF BIRTH:  [birth year]  SUBJECTIVE:  Came in with severe respiratory distress. Improving slowly and weaning oxygen down  REVIEW OF SYSTEMS:   Review of Systems  Constitutional: Negative for chills, fever and weight loss.  HENT: Negative for ear discharge, ear pain and nosebleeds.   Eyes: Negative for blurred vision, pain and discharge.  Respiratory: Positive for shortness of breath. Negative for sputum production, wheezing and stridor.   Cardiovascular: Positive for chest pain and leg swelling. Negative for palpitations, orthopnea and PND.  Gastrointestinal: Negative for abdominal pain, diarrhea, nausea and vomiting.  Genitourinary: Negative for frequency and urgency.  Musculoskeletal: Negative for back pain and joint pain.  Neurological: Positive for weakness. Negative for sensory change, speech change and focal weakness.  Psychiatric/Behavioral: Negative for depression and hallucinations. The patient is not nervous/anxious.    Tolerating Diet:some Tolerating PT: Pending  DRUG ALLERGIES:   Allergies  Allergen Reactions  . Lisinopril     Other reaction(s): Unknown  . Niacin     Other reaction(s): Unknown  . Sitagliptin     Other reaction(s): Unknown  . Sulfamethoxazole-Trimethoprim     Other reaction(s): Unknown  . Tetanus Toxoids     Other reaction(s): Unknown    VITALS:  Blood pressure 116/72, pulse 98, temperature 97.6 F (36.4 C), temperature source Oral, resp. rate (!) 22, height 5\' 9"  (1.753 m), weight (!) 137 kg (302 lb), SpO2 90 %.  PHYSICAL EXAMINATION:   Physical Exam  GENERAL:  65 y.o.-year-old patient lying in the bed with no acute distress. Severe obesity EYES: Pupils equal, round, reactive to light and accommodation. No scleral icterus. Extraocular muscles intact.  HEENT: Head atraumatic, normocephalic. Oropharynx and nasopharynx clear.   NECK:  Supple, no jugular venous distention. No thyroid enlargement, no tenderness.  LUNGS: Normal breath sounds bilaterally, no wheezing, rales, rhonchi. No use of accessory muscles of respiration.  CARDIOVASCULAR: S1, S2 normal. No murmurs, rubs, or gallops.  ABDOMEN: Soft, nontender, nondistended. Bowel sounds present. No organomegaly or mass.  EXTREMITIES: No cyanosis, clubbing  -  Chronic bilateral leg edma with venous stasis changes  NEUROLOGIC: Cranial nerves II through XII are intact. No focal Motor or sensory deficits b/l.   PSYCHIATRIC:  patient is alert and oriented x 3.  SKIN: No obvious rash, lesion, or ulcer.   LABORATORY PANEL:  CBC  Recent Labs Lab 05/03/16 0408  WBC 16.0*  HGB 11.4*  HCT 35.5  PLT 190    Chemistries   Recent Labs Lab 05/02/16 1405  05/03/16 1801  05/06/16 0620  NA 138  < >  --   < > 142  K 4.3  < >  --   < > 3.4*  CL 105  < >  --   < > 108  CO2 27  < >  --   < > 28  GLUCOSE 220*  < >  --   < > 193*  BUN 35*  < >  --   < > 23*  CREATININE 1.49*  < >  --   < > 0.87  CALCIUM 8.0*  < >  --   < > 7.9*  MG 1.8  < > 1.9  --   --   AST 147*  --   --   --   --   ALT 27  --   --   --   --  ALKPHOS 65  --   --   --   --   BILITOT 0.8  --   --   --   --   < > = values in this interval not displayed. Cardiac Enzymes  Recent Labs Lab 05/03/16 0210  TROPONINI >65.00*   RADIOLOGY:  No results found. ASSESSMENT AND PLAN:  65 year old female past medical history of morbid obesity,OSA/OHS,hypertension, diabetes, severe aortic stenosis, presents in respiratory failure requiring emergent intubation in the ER. patient  was noted to be in severe respiratory distress, desaturating on her CPAP machine, she was emergently brought to the ER and was not improving, noted to have significant desaturations in the 70s and low 80s, she was emergently intubated.  1 Acute sever hypoxic respiratory failure due to flash pulmonary edema in the setting of CHF/OSA  and critical AS -pt now extubated -currently on 4 liter [place]---wean as tolerated. Pt will need to assessed for home oxygen -CTA chest Negative for PE -Procalcitonin -20.11(11/7)  2. ACUTE ACS with h/o Severe aortic stenosis, H x of Hypertension Probable pulmonary hypertension Elevated troponin -Cardiology consult with dr Josefa Half noted- medical rx -received Lovenox for ACS---change to DVT prophylaxis  3.Acute Kidney Injury-resolved Gentle hydration Avoid Nephrotoxic drugs Replace electrolytes per ICU protocol  4.Lovenox for DVT prophylaxis  5. Group B streptococcus infection -Continue high dose keflex Monitor fever curve   6.Diabetes Mellitus  on lantus 20 units  and SSI -d/c metformin  6. Possible OSA -does not have CPAP at home -will consider using BIPAP at nite Pt will need sleep study as out pt  Transfer to 2A D/w pt's cousin [name] in the room  Case discussed with Care Management/Social Worker. Management plans discussed with the patient, family and they are in agreement.  CODE STATUS full DVT Prophylaxis: lovnoex TOTAL TIME TAKING CARE OF THIS PATIENT: 23minutes.  >50% time spent on counselling and coordination of care  POSSIBLE D/C IN2-3 DAYS, DEPENDING ON CLINICAL CONDITION.  Note: This dictation was prepared with Dragon dictation along with smaller phrase technology. Any transcriptional errors that result from this process are unintentional.  Elai Vanwyk M.D on 05/05/2016   Between 7am to 6pm - Pager - [phone]  After 6pm go to [email or web address] - password [email or web address] [facility]  [facility]  Office  [phone]  CC: Primary care physician; Cephus Richer, MD

## 2016-05-06 NOTE — Evaluation (Signed)
Occupational Therapy Evaluation Patient Details Name: [name] MRN: [id number] DOB: 03/01/1951 Today's Date: 05/06/2016    History of Present Illness Pt is a 65 y/o F who presented in respiratory failure requiring emergent intubation in the ER.  Admitting dx: Acute sever hypoxic respiratory failure due to flash pulmonary edema in the setting of CHF/OSA, critical AS, and NSTEMI.  CTA chest negative for PE.  Pt was extubated on 11/8.  Pt's PMH includes CHF, morbid obesity.   Clinical Impression   Patient was seen for OT evaluation this date as follows:  Patient lives alone and has an aide who comes to her home M-F for 2/5 hours a day and assists with self care tasks particularly bathing, occasionally with dressing if she is planning to attend an appt, helps remind her to take her medications, helps to prepare meals and perform light homemaking tasks.  She has a cousin who will take her to her appts if needed.  She was able to heat up meals in the microwave.  She has a shower chair with a hand held shower, grab bar, elevated toilet, and cane.  She does not have a BSC however she may need one in the future if she were to return home.  She now is on O2 3.5L and requires increased assistance with transfers, functional mobility, self care tasks and IADLs.  She would benefit from skilled OT to maximize her safety and independence in daily tasks and will likely require short term rehab prior to possibly returning home.      Follow Up Recommendations  SNF    Equipment Recommendations  3 in 1 bedside comode    Recommendations for Other Services       Precautions / Restrictions Precautions Precautions: Fall Precaution Comments: monitor O2 Restrictions Weight Bearing Restrictions: No      Mobility Bed Mobility Overal bed mobility: Needs Assistance;+2 for physical assistance                Transfers Overall transfer level: Needs assistance Equipment used: Rolling walker (2  wheeled)                  Balance                                            ADL Overall ADL's : Needs assistance/impaired Eating/Feeding: Set up   Grooming: Set up;Sitting   Upper Body Bathing: Minimal assitance   Lower Body Bathing: Maximal assistance   Upper Body Dressing : Minimal assistance   Lower Body Dressing: Total assistance   Toilet Transfer: +2 for physical assistance Toilet Transfer Details (indicate cue type and reason): per clinical judgment           General ADL Comments: Patient is short of breath with talking which increases with any activity and effort.  She reports she did not have oxygen at home previously but now on 3.5 L nasal cannula.      Vision     Perception     Praxis      Pertinent Vitals/Pain Pain Assessment: No/denies pain     Hand Dominance Right   Extremity/Trunk Assessment Upper Extremity Assessment Upper Extremity Assessment: RUE deficits/detail;LUE deficits/detail RUE Deficits / Details: ROM WFLs, strength 3/5 overall, reports her right wrist and forearm have felt "weird", almost numb but immproving. RUE Sensation: decreased light touch RUE Coordination:  decreased gross motor LUE Deficits / Details: 3/5 overall   Lower Extremity Assessment Lower Extremity Assessment: Defer to PT evaluation       Communication Communication Communication: No difficulties   Cognition Arousal/Alertness: Awake/alert Behavior During Therapy: WFL for tasks assessed/performed Overall Cognitive Status: Within Functional Limits for tasks assessed                     General Comments       Exercises       Shoulder Instructions      Home Living Family/patient expects to be discharged to:: Private residence Living Arrangements: Alone Available Help at Discharge: Family;Available PRN/intermittently Type of Home: Apartment Home Access: Stairs to enter Entrance Stairs-Number of Steps: 2 Entrance  Stairs-Rails: None Home Layout: One level     Bathroom Shower/Tub: Tub/shower unit;Curtain Shower/tub characteristics: Architectural technologist: Standard Bathroom Accessibility: Yes   Home Equipment: Cane - single point;Shower seat          Prior Functioning/Environment Level of Independence: Independent    ADL's / Homemaking Assistance Needed: Pt not driving, [name] her cousin takes her to her doctor appointments.  Pt has an aide that comes M-F 8:30-11am and assists with bathing, cleaning.  Pt says she eats microwave dinners as she has a hard time cooking. Pt dresses independently but wears only pullover dresses.  Requires assistance for bathing.            OT Problem List: Decreased strength;Obesity;Decreased activity tolerance;Decreased knowledge of use of DME or AE   OT Treatment/Interventions: Self-care/ADL training;DME and/or AE instruction;Therapeutic exercise;Energy conservation;Patient/family education    OT Goals(Current goals can be found in the care plan section) Acute Rehab OT Goals Patient Stated Goal: "to be able to do as much as I can" OT Goal Formulation: With patient Time For Goal Achievement: 05/20/16 Potential to Achieve Goals: Fair  OT Frequency: Min 1X/week   Barriers to D/C:    pt lives alone and now requires increase assistance with all tasks.       Co-evaluation              End of Session Equipment Utilized During Treatment: Oxygen  Activity Tolerance: Patient tolerated treatment well Patient left: in bed;with call bell/phone within reach   Time: 0932-6712 OT Time Calculation (min): 25 min Charges:  OT General Charges $OT Visit: 1 Procedure OT Evaluation $OT Eval Moderate Complexity: 1 Procedure OT Treatments $Self Care/Home Management : 8-22 mins G-Codes:    Anjani Feuerborn  Chinwe Lope T Cyprian Gong[place], OTR/L, CLT  05/06/2016, 2:16 PM

## 2016-05-06 NOTE — NC FL2 (Signed)
[place] LEVEL OF CARE SCREENING TOOL     IDENTIFICATION  Patient Name: [name] Birthdate: 09-18-1950 Sex: female Admission Date (Current Location): 05/02/2016  [place] and [place] Number:  Engineering geologist and Address:  [facility], [address] [place], [place] [address]      Provider Number: [id number]  Attending Physician Name and Address:  Fritzi Mandes, MD  Relative Name and Phone Number:       Current Level of Care: Hospital Recommended Level of Care: [place] Prior Approval Number:    Date Approved/Denied: 05/06/16 PASRR Number: [id number]  Discharge Plan: SNF    Current Diagnoses: Patient Active Problem List   Diagnosis Date Noted  . Acute respiratory failure with hypoxia and hypercapnia ([place]) 05/02/2016  . Pressure injury of skin 05/02/2016  . Lactic acidosis   . Severe aortic stenosis   . OSA on CPAP   . Morbid obesity ([place])   . Community acquired pneumonia     Orientation RESPIRATION BLADDER Height & Weight     Self, Time, Situation, Place  O2 (3L o2) Incontinent Weight: (!) 302 lb (137 kg) Height:  5\' 9"  (175.3 cm)  BEHAVIORAL SYMPTOMS/MOOD NEUROLOGICAL BOWEL NUTRITION STATUS      Continent    AMBULATORY STATUS COMMUNICATION OF NEEDS Skin   Total Care Verbally Normal                       Personal Care Assistance Level of Assistance  Bathing Bathing Assistance: Maximum assistance   Dressing Assistance: Maximum assistance     Functional Limitations Info             SPECIAL CARE FACTORS FREQUENCY  PT (By licensed PT), OT (By licensed OT)     PT Frequency: up to 5X per day 5 days per week OT Frequency: up to 5X per day 5 days per week            Contractures Contractures Info: Present    Additional Factors Info  Code Status, Allergies Code Status Info: DNR Allergies Info: Lisinopril, Niacin, Sitagliptin, Sulfamethoxazole-trimethoprim, Tetanus  Toxoids           Current Medications (05/06/2016):  This is the current hospital active medication list Current Facility-Administered Medications  Medication Dose Route Frequency Provider Last Rate Last Dose  . 0.9 %  sodium chloride infusion  250 mL Intravenous PRN Vishal Mungal, MD      . acetaminophen (TYLENOL) tablet 650 mg  650 mg Oral Q4H PRN Vilinda Boehringer, MD   650 mg at 05/03/16 2107  . aspirin EC tablet 81 mg  81 mg Oral Daily Vishal Mungal, MD   81 mg at 05/06/16 0946  . atorvastatin (LIPITOR) tablet 40 mg  40 mg Oral q1800 Vishal Mungal, MD   40 mg at 05/05/16 1730  . cephALEXin (KEFLEX) capsule 1,000 mg  1,000 mg Oral Q6H Vishal Mungal, MD   1,000 mg at 05/06/16 1221  . enoxaparin (LOVENOX) injection 40 mg  40 mg Subcutaneous Q24H Vishal Mungal, MD   40 mg at 05/06/16 0945  . fenofibrate tablet 54 mg  54 mg Oral Daily Fritzi Mandes, MD   54 mg at 05/06/16 0946  . furosemide (LASIX) tablet 40 mg  40 mg Oral Daily Fritzi Mandes, MD   40 mg at 05/06/16 0946  . insulin aspart (novoLOG) injection 0-20 Units  0-20 Units Subcutaneous TID WC Vishal Mungal, MD   7 Units  at 05/06/16 1221  . insulin aspart (novoLOG) injection 0-5 Units  0-5 Units Subcutaneous QHS Vilinda Boehringer, MD   2 Units at 05/05/16 2206  . insulin glargine (LANTUS) injection 20 Units  20 Units Subcutaneous Q2200 Fritzi Mandes, MD   20 Units at 05/05/16 2207  . ipratropium-albuterol (DUONEB) 0.5-2.5 (3) MG/3ML nebulizer solution 3 mL  3 mL Nebulization Q6H Vishal Mungal, MD   3 mL at 05/06/16 1548  . LORazepam (ATIVAN) injection 2 mg  2 mg Intravenous Once Earleen Newport, MD      . MEDLINE mouth rinse  15 mL Mouth Rinse BID Vishal Mungal, MD   15 mL at 05/06/16 0946  . metoprolol succinate (TOPROL-XL) 24 hr tablet 25 mg  25 mg Oral Daily Isaias Cowman, MD   25 mg at 05/06/16 0946  . ondansetron (ZOFRAN) injection 4 mg  4 mg Intravenous Q6H PRN Vishal Mungal, MD      . sodium chloride flush (NS) 0.9 % injection 10-40  mL  10-40 mL Intracatheter Q12H Vishal Mungal, MD   10 mL at 05/05/16 2200  . sodium chloride flush (NS) 0.9 % injection 10-40 mL  10-40 mL Intracatheter PRN Vilinda Boehringer, MD         Discharge Medications: Please see discharge summary for a list of discharge medications.  Relevant Imaging Results:  Relevant Lab Results:   Additional Information  SS [id number]  Zettie Pho, LCSW

## 2016-05-06 NOTE — Progress Notes (Signed)
[facility] [place] NAME: [name]    MR#:  [id number]  DATE OF BIRTH:  [birth year]  SUBJECTIVE:  Came in with severe respiratory distress Doing well no won 4 liter [place] oxygen  REVIEW OF SYSTEMS:   Review of Systems  Constitutional: Negative for chills, fever and weight loss.  HENT: Negative for ear discharge, ear pain and nosebleeds.   Eyes: Negative for blurred vision, pain and discharge.  Respiratory: Positive for shortness of breath. Negative for sputum production, wheezing and stridor.   Cardiovascular: Positive for chest pain and leg swelling. Negative for palpitations, orthopnea and PND.  Gastrointestinal: Negative for abdominal pain, diarrhea, nausea and vomiting.  Genitourinary: Negative for frequency and urgency.  Musculoskeletal: Negative for back pain and joint pain.  Neurological: Positive for weakness. Negative for sensory change, speech change and focal weakness.  Psychiatric/Behavioral: Negative for depression and hallucinations. The patient is not nervous/anxious.    Tolerating Diet:some Tolerating PT: Rehab  DRUG ALLERGIES:   Allergies  Allergen Reactions  . Lisinopril     Other reaction(s): Unknown  . Niacin     Other reaction(s): Unknown  . Sitagliptin     Other reaction(s): Unknown  . Sulfamethoxazole-Trimethoprim     Other reaction(s): Unknown  . Tetanus Toxoids     Other reaction(s): Unknown    VITALS:  Blood pressure 116/72, pulse 98, temperature 97.6 F (36.4 C), temperature source Oral, resp. rate (!) 22, height 5\' 9"  (1.753 m), weight (!) 137 kg (302 lb), SpO2 90 %.  PHYSICAL EXAMINATION:   Physical Exam  GENERAL:  65 y.o.-year-old patient lying in the bed with no acute distress. Severe obesity EYES: Pupils equal, round, reactive to light and accommodation. No scleral icterus. Extraocular muscles intact.  HEENT: Head atraumatic, normocephalic. Oropharynx and nasopharynx clear.  NECK:   Supple, no jugular venous distention. No thyroid enlargement, no tenderness.  LUNGS: Normal breath sounds bilaterally, no wheezing, rales, rhonchi. No use of accessory muscles of respiration.  CARDIOVASCULAR: S1, S2 normal. No murmurs, rubs, or gallops.  ABDOMEN: Soft, nontender, nondistended. Bowel sounds present. No organomegaly or mass.  EXTREMITIES: No cyanosis, clubbing  -  Chronic bilateral leg edma with venous stasis changes  NEUROLOGIC: Cranial nerves II through XII are intact. No focal Motor or sensory deficits b/l.   PSYCHIATRIC:  patient is alert and oriented x 3.  SKIN: No obvious rash, lesion, or ulcer.   LABORATORY PANEL:  CBC  Recent Labs Lab 05/03/16 0408  WBC 16.0*  HGB 11.4*  HCT 35.5  PLT 190    Chemistries   Recent Labs Lab 05/02/16 1405  05/03/16 1801  05/06/16 0620  NA 138  < >  --   < > 142  K 4.3  < >  --   < > 3.4*  CL 105  < >  --   < > 108  CO2 27  < >  --   < > 28  GLUCOSE 220*  < >  --   < > 193*  BUN 35*  < >  --   < > 23*  CREATININE 1.49*  < >  --   < > 0.87  CALCIUM 8.0*  < >  --   < > 7.9*  MG 1.8  < > 1.9  --   --   AST 147*  --   --   --   --   ALT 27  --   --   --   --  ALKPHOS 65  --   --   --   --   BILITOT 0.8  --   --   --   --   < > = values in this interval not displayed. Cardiac Enzymes  Recent Labs Lab 05/03/16 0210  TROPONINI >65.00*   RADIOLOGY:  No results found. ASSESSMENT AND PLAN:  65 year old female past medical history of morbid obesity,OSA/OHS,hypertension, diabetes, severe aortic stenosis, presents in respiratory failure requiring emergent intubation in the ER. patient  was noted to be in severe respiratory distress, desaturating on her CPAP machine, she was emergently brought to the ER and was not improving, noted to have significant desaturations in the 70s and low 80s, she was emergently intubated.  1 Acute sever hypoxic respiratory failure due to flash pulmonary edema in the setting of CHF/OSA and  critical AS -pt now extubated -currently on 4 liter [place]---wean as tolerated. Pt will need to assessed for home oxygen -CTA chest Negative for PE -Procalcitonin -20.11(11/7)  2.Severe aortic stenosis, H x of Hypertension Probable pulmonary hypertension Elevated troponin- worsening troponin -Cardiology consult with dr Josefa Half noted -Lovenox for ACS - Pressors to maintain a MAP goal >65   3.Acute Kidney Injury-resolved Gentle hydration Avoid Nephrotoxic drugs Replace electrolytes per ICU protocol  4.Lovenox for DVT prophylaxis  5. Group B streptococcus infection -Continue high dose keflex Monitor fever curve   6.Diabetes Mellitus  on lantus 20 units  and SSI -d/c metformin  6. Possible OSA -does not have CPAP at home -will consider using BIPAP at nite Pt will need sleep study as out pt  CSW for d/c planning Case discussed with Care Management/Social Worker. Management plans discussed with the patient, family and they are in agreement.  CODE STATUS full DVT Prophylaxis: lovnoex TOTAL criticalTIME TAKING CARE OF THIS PATIENT: 44minutes.  >50% time spent on counselling and coordination of care  POSSIBLE D/C IN2-3 DAYS, DEPENDING ON CLINICAL CONDITION.  Note: This dictation was prepared with Dragon dictation along with smaller phrase technology. Any transcriptional errors that result from this process are unintentional.  Tahji Beulah M.D on 05/06/2016 at 12:15 PM  Between 7am to 6pm - Pager - [phone]  After 6pm go to [email or web address] - password [email or web address] [facility]  [facility]  Office  [phone]  CC: Primary care physician; Cephus Richer, MD

## 2016-05-06 NOTE — Progress Notes (Signed)
  Pharmacy Consult for electrolyte replacement   Allergies  Allergen Reactions  . Lisinopril     Other reaction(s): Unknown  . Niacin     Other reaction(s): Unknown  . Sitagliptin     Other reaction(s): Unknown  . Sulfamethoxazole-Trimethoprim     Other reaction(s): Unknown  . Tetanus Toxoids     Other reaction(s): Unknown    Patient Measurements: Height: 5\' 9"  (175.3 cm) Weight: (!) 302 lb (137 kg) IBW/kg (Calculated) : 66.2    Recent Labs  05/03/16 1801 05/04/16 0551 05/06/16 0620  NA  --  139 142  K  --  3.7 3.4*  CL  --  106 108  CO2  --  25 28  GLUCOSE  --  210* 193*  BUN  --  18 23*  CREATININE  --  0.99 0.87  CALCIUM  --  7.9* 7.9*  MG 1.9  --   --   TRIG  --  105  --   CHOLHDL  --  2.3  --   CHOL  --  86  --    Estimated Creatinine Clearance: 96.2 mL/min (by C-G formula based on SCr of 0.87 mg/dL).   Assessment: Pharmacy consulted to manage electrolytes in this 65 year old female admitted with NSTEMI and acute respiratory failure. S/p extubation. Currently ordered furosemide 40mg  PO daily Plan:   Potassium low, will give KCl 40 mEq PO x 1. Recheck potassium tomorrow AM.  Rexene Edison C 05/06/2016,7:41 AM

## 2016-05-07 LAB — GLUCOSE, CAPILLARY
GLUCOSE-CAPILLARY: 199 mg/dL — AB (ref 65–99)
Glucose-Capillary: 189 mg/dL — ABNORMAL HIGH (ref 65–99)
Glucose-Capillary: 228 mg/dL — ABNORMAL HIGH (ref 65–99)
Glucose-Capillary: 176 mg/dL — ABNORMAL HIGH (ref 65–99)

## 2016-05-07 LAB — CULTURE, BLOOD (ROUTINE X 2): CULTURE: NO GROWTH

## 2016-05-07 LAB — POTASSIUM: POTASSIUM: 3.6 mmol/L (ref 3.5–5.1)

## 2016-05-07 MED ORDER — POTASSIUM CHLORIDE CRYS ER 20 MEQ PO TBCR
20.0000 meq | EXTENDED_RELEASE_TABLET | Freq: Once | ORAL | Status: AC
  Administered 2016-05-07: 20 meq via ORAL
  Filled 2016-05-07: qty 1

## 2016-05-07 NOTE — Progress Notes (Signed)
[facility] [place] NAME: [name]    MR#:  [id number]  DATE OF BIRTH:  [birth year]  SUBJECTIVE:  Came in with severe respiratory distress Doing well now on 3 liter [place] oxygen  REVIEW OF SYSTEMS:   Review of Systems  Constitutional: Negative for chills, fever and weight loss.  HENT: Negative for ear discharge, ear pain and nosebleeds.   Eyes: Negative for blurred vision, pain and discharge.  Respiratory: Positive for shortness of breath. Negative for sputum production, wheezing and stridor.   Cardiovascular: Positive for chest pain and leg swelling. Negative for palpitations, orthopnea and PND.  Gastrointestinal: Negative for abdominal pain, diarrhea, nausea and vomiting.  Genitourinary: Negative for frequency and urgency.  Musculoskeletal: Negative for back pain and joint pain.  Neurological: Positive for weakness. Negative for sensory change, speech change and focal weakness.  Psychiatric/Behavioral: Negative for depression and hallucinations. The patient is not nervous/anxious.    Tolerating Diet:some Tolerating PT: Rehab  DRUG ALLERGIES:   Allergies  Allergen Reactions  . Lisinopril     Other reaction(s): Unknown  . Niacin     Other reaction(s): Unknown  . Sitagliptin     Other reaction(s): Unknown  . Sulfamethoxazole-Trimethoprim     Other reaction(s): Unknown  . Tetanus Toxoids     Other reaction(s): Unknown    VITALS:  Blood pressure 114/75, pulse (!) 101, temperature 97.4 F (36.3 C), temperature source Oral, resp. rate 18, height 5\' 9"  (9.629 m), weight (!) 137.3 kg (302 lb 9.6 oz), SpO2 92 %.  PHYSICAL EXAMINATION:   Physical Exam  GENERAL:  65 y.o.-year-old patient lying in the bed with no acute distress. Severe obesity EYES: Pupils equal, round, reactive to light and accommodation. No scleral icterus. Extraocular muscles intact.  HEENT: Head atraumatic, normocephalic. Oropharynx and nasopharynx  clear.  NECK:  Supple, no jugular venous distention. No thyroid enlargement, no tenderness.  LUNGS: Normal breath sounds bilaterally, no wheezing, rales, rhonchi. No use of accessory muscles of respiration.  CARDIOVASCULAR: S1, S2 normal. No murmurs, rubs, or gallops.  ABDOMEN: Soft, nontender, nondistended. Bowel sounds present. No organomegaly or mass.  EXTREMITIES: No cyanosis, clubbing  -  Chronic bilateral leg edma with venous stasis changes  NEUROLOGIC: Cranial nerves II through XII are intact. No focal Motor or sensory deficits b/l.   PSYCHIATRIC:  patient is alert and oriented x 3.  SKIN: No obvious rash, lesion, or ulcer.   LABORATORY PANEL:  CBC  Recent Labs Lab 05/03/16 0408  WBC 16.0*  HGB 11.4*  HCT 35.5  PLT 190    Chemistries   Recent Labs Lab 05/02/16 1405  05/03/16 1801  05/06/16 0620 05/07/16 0518  NA 138  < >  --   < > 142  --   K 4.3  < >  --   < > 3.4* 3.6  CL 105  < >  --   < > 108  --   CO2 27  < >  --   < > 28  --   GLUCOSE 220*  < >  --   < > 193*  --   BUN 35*  < >  --   < > 23*  --   CREATININE 1.49*  < >  --   < > 0.87  --   CALCIUM 8.0*  < >  --   < > 7.9*  --   MG 1.8  < > 1.9  --   --   --  AST 147*  --   --   --   --   --   ALT 27  --   --   --   --   --   ALKPHOS 65  --   --   --   --   --   BILITOT 0.8  --   --   --   --   --   < > = values in this interval not displayed. Cardiac Enzymes  Recent Labs Lab 05/03/16 0210  TROPONINI >65.00*   RADIOLOGY:  No results found. ASSESSMENT AND PLAN:  65 year old female past medical history of morbid obesity,OSA/OHS,hypertension, diabetes, severe aortic stenosis, presents in respiratory failure requiring emergent intubation in the ER. patient  was noted to be in severe respiratory distress, desaturating on her CPAP machine, she was emergently brought to the ER and was not improving, noted to have significant desaturations in the 70s and low 80s, she was emergently intubated.  1 Acute  sever hypoxic respiratory failure due to flash pulmonary edema in the setting of CHF/OSA and critical AS -pt now extubated -currently on 4 liter [place]---wean as tolerated. Pt will need to assessed for home oxygen -CTA chest Negative for PE -Procalcitonin -20.11(11/7)  2.Severe aortic stenosis, H x of Hypertension Probable pulmonary hypertension Elevated troponin- worsening troponin -Cardiology consult with dr Josefa Half noted -Lovenox for ACS - Pressors to maintain a MAP goal >65   3.Acute Kidney Injury-resolved Gentle hydration Avoid Nephrotoxic drugs Replace electrolytes per ICU protocol  4.Lovenox for DVT prophylaxis  5. Group B streptococcus infection -Continue high dose keflex (total 10 days)   6.Diabetes Mellitus  on lantus 20 units  and SSI -d/c metformin  6. Possible OSA -does not have CPAP at home -will consider using BIPAP at nite Pt will need sleep study as out pt  CSW for d/c planning Case discussed with Care Management/Social Worker. Management plans discussed with the patient, family and they are in agreement.  CODE STATUS full DVT Prophylaxis: lovnoex TOTALTIME TAKING CARE OF THIS PATIENT: 23minutes.  >50% time spent on counselling and coordination of care  POSSIBLE D/C IN2-3 DAYS, DEPENDING ON CLINICAL CONDITION.  Note: This dictation was prepared with Dragon dictation along with smaller phrase technology. Any transcriptional errors that result from this process are unintentional.  Jhase Creppel M.D on 05/07/2016 at 12:35 PM  Between 7am to 6pm - Pager - [phone]  After 6pm go to [email or web address] - password [email or web address] [facility]  [facility]  Office  [phone]  CC: Primary care physician; Cephus Richer, MD

## 2016-05-07 NOTE — Clinical Social Work Note (Signed)
Clinical Social Work Assessment  Patient Details  Name: [name] MRN: [id number] Date of Birth: 03/05/1951  Date of referral:  05/07/16               Reason for consult:  Facility Placement                Permission sought to share information with:  Facility Art therapist granted to share information::  Yes, Verbal Permission Granted  Name::        Agency::     Relationship::     Contact Information:     Housing/Transportation Living arrangements for the past 2 months:  Apartment Source of Information:  Patient Patient Interpreter Needed:  None Criminal Activity/Legal Involvement Pertinent to Current Situation/Hospitalization:  No - Comment as needed Significant Relationships:  Siblings Lives with:  Self Do you feel safe going back to the place where you live?  Yes Need for family participation in patient care:  No (Coment)  Care giving concerns:  STR   Social Worker assessment / plan: CSW visited patient at bedside to discuss dc planning. The patient gave verbal permission to begin STR bed search, and she indicated that her first choice is [facility]. The patient recently began receiving Medicare as of October 1st.  The patient lives alone in an apartment. She reported that she uses a cane when she leaves the apartment, but that within her apartment she uses walls and chairs to steady herself. The patient has family support.  Employment status:  Disabled (Comment on whether or not currently receiving Disability) (Patient receives disability) Insurance information:  Medicare (Patient began receiving Medicare as of October 1st) PT Recommendations:  [place] / Referral to community resources:  [place]  Patient/Family's Response to care:  Patient thanked CSW for assistance.  Patient/Family's Understanding of and Emotional Response to Diagnosis, Current Treatment, and Prognosis:  Patient is in agreement with the  dc plan for now.  Emotional Assessment Appearance:  Appears stated age Attitude/Demeanor/Rapport:  Attention Seeking (Pleasant) Affect (typically observed):  Anxious, Pleasant Orientation:  Oriented to Self, Oriented to Place, Oriented to  Time, Oriented to Situation Alcohol / Substance use:  Never Used Psych involvement (Current and /or in the community):  No (Comment)  Discharge Needs  Concerns to be addressed:  Discharge Planning Concerns Readmission within the last 30 days:  No Current discharge risk:  Chronically ill Barriers to Discharge:  Continued Medical Work up   [employer], LCSW 05/07/2016, 2:17 PM

## 2016-05-07 NOTE — Progress Notes (Signed)
  Pharmacy Consult for electrolyte replacement   Allergies  Allergen Reactions  . Lisinopril     Other reaction(s): Unknown  . Niacin     Other reaction(s): Unknown  . Sitagliptin     Other reaction(s): Unknown  . Sulfamethoxazole-Trimethoprim     Other reaction(s): Unknown  . Tetanus Toxoids     Other reaction(s): Unknown    Patient Measurements: Height: 5\' 9"  (175.3 cm) Weight: (!) 302 lb 9.6 oz (137.3 kg) IBW/kg (Calculated) : 66.2    Recent Labs  05/06/16 0620 05/07/16 0518  NA 142  --   K 3.4* 3.6  CL 108  --   CO2 28  --   GLUCOSE 193*  --   BUN 23*  --   CREATININE 0.87  --   CALCIUM 7.9*  --    Estimated Creatinine Clearance: 96.3 mL/min (by C-G formula based on SCr of 0.87 mg/dL).   Assessment: Pharmacy consulted to manage electrolytes in this 65 year old female admitted with NSTEMI and acute respiratory failure. S/p extubation. Currently ordered furosemide 40mg  PO daily  Plan:   Potassium WNL, continues on furosemide 40mg  PO daily, will give potassium 53mEq PO today. Continue to follow.   [name] C 05/07/2016,7:54 AM

## 2016-05-08 LAB — GLUCOSE, CAPILLARY
GLUCOSE-CAPILLARY: 185 mg/dL — AB (ref 65–99)
GLUCOSE-CAPILLARY: 232 mg/dL — AB (ref 65–99)
GLUCOSE-CAPILLARY: 172 mg/dL — AB (ref 65–99)
GLUCOSE-CAPILLARY: 202 mg/dL — AB (ref 65–99)
Glucose-Capillary: 179 mg/dL — ABNORMAL HIGH (ref 65–99)

## 2016-05-08 LAB — CBC
HCT: 30.2 % — ABNORMAL LOW (ref 35.0–47.0)
Hemoglobin: 9.8 g/dL — ABNORMAL LOW (ref 12.0–16.0)
MCH: 27 pg (ref 26.0–34.0)
MCHC: 32.6 g/dL (ref 32.0–36.0)
MCV: 82.7 fL (ref 80.0–100.0)
PLATELETS: 258 10*3/uL (ref 150–440)
RBC: 3.65 MIL/uL — ABNORMAL LOW (ref 3.80–5.20)
RDW: 15.2 % — ABNORMAL HIGH (ref 11.5–14.5)
WBC: 7.6 10*3/uL (ref 3.6–11.0)

## 2016-05-08 LAB — POTASSIUM: Potassium: 3.6 mmol/L (ref 3.5–5.1)

## 2016-05-08 MED ORDER — POTASSIUM CHLORIDE CRYS ER 20 MEQ PO TBCR
40.0000 meq | EXTENDED_RELEASE_TABLET | Freq: Every day | ORAL | Status: DC
  Administered 2016-05-08 – 2016-05-09 (×2): 40 meq via ORAL
  Filled 2016-05-08 (×2): qty 2

## 2016-05-08 MED ORDER — FUROSEMIDE 40 MG PO TABS
40.0000 mg | ORAL_TABLET | Freq: Two times a day (BID) | ORAL | Status: DC
  Administered 2016-05-08 – 2016-05-09 (×3): 40 mg via ORAL
  Filled 2016-05-08 (×3): qty 1

## 2016-05-08 MED ORDER — INSULIN GLARGINE 100 UNIT/ML ~~LOC~~ SOLN
30.0000 [IU] | Freq: Every day | SUBCUTANEOUS | Status: DC
  Administered 2016-05-08: 30 [IU] via SUBCUTANEOUS
  Filled 2016-05-08 (×2): qty 0.3

## 2016-05-08 MED ORDER — FUROSEMIDE 10 MG/ML IJ SOLN
40.0000 mg | Freq: Once | INTRAMUSCULAR | Status: AC
  Administered 2016-05-08: 40 mg via INTRAVENOUS
  Filled 2016-05-08: qty 4

## 2016-05-08 MED ORDER — ENOXAPARIN SODIUM 40 MG/0.4ML ~~LOC~~ SOLN
40.0000 mg | Freq: Two times a day (BID) | SUBCUTANEOUS | Status: DC
  Administered 2016-05-08 – 2016-05-09 (×2): 40 mg via SUBCUTANEOUS
  Filled 2016-05-08 (×2): qty 0.4

## 2016-05-08 NOTE — Progress Notes (Deleted)
MRSA by PCR came positive so patient is put on contact isolation. Skin assessment was not done due to dyspnea on  exertion. Will be done later this shift or during morning shift. Patient is resting comfortably at this time   with his eyes closed, respirations even and unlabored. Will continue to monitor.

## 2016-05-08 NOTE — Care Management (Signed)
Patient is declining CIR.  She and her family do not feel patient can withstand 3 hours of therapy a day.  She and her family also prefer that patient remain in [facility].  Received fax that patient is also followed by [facility] for in home services. she hopes to receive a bed offer from [facility].  CSW is involved

## 2016-05-08 NOTE — Progress Notes (Signed)
[facility] [place] NAME: [name]    MR#:  [id number]  DATE OF BIRTH:  1951/02/10  SUBJECTIVE:  Came in with severe respiratory distress  Still on 4 L O2. Has orthopnea. Feels weak  REVIEW OF SYSTEMS:   Review of Systems  Constitutional: Negative for chills, fever and weight loss.  HENT: Negative for ear discharge, ear pain and nosebleeds.   Eyes: Negative for blurred vision, pain and discharge.  Respiratory: Positive for shortness of breath. Negative for sputum production, wheezing and stridor.   Cardiovascular: Positive for chest pain and leg swelling. Negative for palpitations, orthopnea and PND.  Gastrointestinal: Negative for abdominal pain, diarrhea, nausea and vomiting.  Genitourinary: Negative for frequency and urgency.  Musculoskeletal: Negative for back pain and joint pain.  Neurological: Positive for weakness. Negative for sensory change, speech change and focal weakness.  Psychiatric/Behavioral: Negative for depression and hallucinations. The patient is not nervous/anxious.    Tolerating Diet: yes Tolerating PT: Rehab  DRUG ALLERGIES:   Allergies  Allergen Reactions  . Lisinopril     Other reaction(s): Unknown  . Niacin     Other reaction(s): Unknown  . Sitagliptin     Other reaction(s): Unknown  . Sulfamethoxazole-Trimethoprim     Other reaction(s): Unknown  . Tetanus Toxoids     Other reaction(s): Unknown    VITALS:  Blood pressure 113/71, pulse 86, temperature 97.9 F (36.6 C), temperature source Oral, resp. rate 20, height 5\' 9"  (1.753 m), weight 135.9 kg (299 lb 9.6 oz), SpO2 94 %.  PHYSICAL EXAMINATION:   Physical Exam  GENERAL:  65 y.o.-year-old patient lying in the bed with no acute distress. Severe obesity EYES: Pupils equal, round, reactive to light and accommodation. No scleral icterus. Extraocular muscles intact.  HEENT: Head atraumatic, normocephalic. Oropharynx and nasopharynx  clear.  NECK:  Supple, no jugular venous distention. No thyroid enlargement, no tenderness.  LUNGS: Bilateral crackles. Conversational dyspnea CARDIOVASCULAR: S1, S2 normal. No murmurs, rubs, or gallops.  ABDOMEN: Soft, nontender, nondistended. Bowel sounds present. No organomegaly or mass.  EXTREMITIES: No cyanosis, clubbing  -  Chronic bilateral leg edma with venous stasis changes  NEUROLOGIC: Cranial nerves II through XII are intact. No focal Motor or sensory deficits b/l.   PSYCHIATRIC:  patient is alert and oriented x 3.  SKIN: No obvious rash, lesion, or ulcer.   LABORATORY PANEL:  CBC  Recent Labs Lab 05/08/16 0544  WBC 7.6  HGB 9.8*  HCT 30.2*  PLT 258    Chemistries   Recent Labs Lab 05/02/16 1405  05/03/16 1801  05/06/16 0620  05/08/16 0544  NA 138  < >  --   < > 142  --   --   K 4.3  < >  --   < > 3.4*  < > 3.6  CL 105  < >  --   < > 108  --   --   CO2 27  < >  --   < > 28  --   --   GLUCOSE 220*  < >  --   < > 193*  --   --   BUN 35*  < >  --   < > 23*  --   --   CREATININE 1.49*  < >  --   < > 0.87  --   --   CALCIUM 8.0*  < >  --   < > 7.9*  --   --  MG 1.8  < > 1.9  --   --   --   --   AST 147*  --   --   --   --   --   --   ALT 27  --   --   --   --   --   --   ALKPHOS 65  --   --   --   --   --   --   BILITOT 0.8  --   --   --   --   --   --   < > = values in this interval not displayed. Cardiac Enzymes  Recent Labs Lab 05/03/16 0210  TROPONINI >65.00*   RADIOLOGY:  No results found. ASSESSMENT AND PLAN:  65 year old female past medical history of morbid obesity,OSA/OHS,hypertension, diabetes, severe aortic stenosis, presents in respiratory failure requiring emergent intubation in the ER. patient  was noted to be in severe respiratory distress, desaturating on her CPAP machine, she was emergently brought to the ER and was not improving, noted to have significant desaturations in the 70s and low 80s, she was emergently intubated.  1 Acute  severe hypoxic respiratory failure due to flash pulmonary edema in the setting of CHF/OSA and critical AS -pt now extubated -currently on 4 liter [place]---wean as tolerated. Pt will need to assessed for home oxygen -CTA chest Negative for PE  Still has crackles and orthopnea. 1 dose IV lasix today.  2.Severe aortic stenosis, H x of Hypertension Probable pulmonary hypertension Elevated troponin- worsening troponin -Cardiology consult with dr Josefa Half noted -Lovenox for ACS. Now stopped  3.Acute Kidney Injury-resolved  4. E coli UTI ON keflex  5. Group B streptococcus Bacteremia -Continue high dose keflex (total 10 days) - Source likely LE skin ulcers from edema - No vegetations on echo   6.Diabetes Mellitus  on lantus 20 units  and SSI Increase to 30 units daily.  6. Possible OSA -does not have CPAP at home -will consider using BIPAP at nite Pt will need sleep study as out pt  Case discussed with Care Management/Social Worker. Management plans discussed with the patient, family and they are in agreement.  CODE STATUS full  DVT Prophylaxis: Lovenox  TOTALTIME TAKING CARE OF THIS PATIENT: 55minutes.   POSSIBLE D/C IN 1-2 DAYS, DEPENDING ON CLINICAL CONDITION.  Note: This dictation was prepared with Dragon dictation along with smaller phrase technology. Any transcriptional errors that result from this process are unintentional.  [name] R M.D on 05/08/2016 at 9:05 AM  Between 7am to 6pm - Pager - [phone]  After 6pm go to [email or web address] - password [email or web address] [facility]  [facility]  Office  [phone]  CC: Primary care physician; Cephus Richer, MD

## 2016-05-08 NOTE — Progress Notes (Signed)
Pharmacy Consult for electrolyte replacement   Allergies  Allergen Reactions  . Lisinopril     Other reaction(s): Unknown  . Niacin     Other reaction(s): Unknown  . Sitagliptin     Other reaction(s): Unknown  . Sulfamethoxazole-Trimethoprim     Other reaction(s): Unknown  . Tetanus Toxoids     Other reaction(s): Unknown    Patient Measurements: Height: 5\' 9"  (175.3 cm) Weight: 299 lb 9.6 oz (135.9 kg) IBW/kg (Calculated) : 66.2    Recent Labs  05/06/16 0620 05/07/16 0518 05/08/16 0544  NA 142  --   --   K 3.4* 3.6 3.6  CL 108  --   --   CO2 28  --   --   GLUCOSE 193*  --   --   BUN 23*  --   --   CREATININE 0.87  --   --   CALCIUM 7.9*  --   --    Estimated Creatinine Clearance: 95.8 mL/min (by C-G formula based on SCr of 0.87 mg/dL).   Assessment: Pharmacy consulted to manage electrolytes in this 65 year old female admitted with NSTEMI and acute respiratory failure. S/p extubation.  Currently ordered furosemide 40mg  PO BID (increased from daily today) K 3.6  Plan:  Potassium WNL, MD order for KCl 40 PO daily. Will continue to follow.   Rayna Sexton L 05/08/2016,2:59 PM

## 2016-05-08 NOTE — Progress Notes (Signed)
Physical Therapy Treatment Patient Details Name: [name] MRN: [id number] DOB: [birth year] Today's Date: 05/08/2016    History of Present Illness Pt is a 64 y/o F who presented in respiratory failure requiring emergent intubation in the ER.  Admitting dx: Acute sever hypoxic respiratory failure due to flash pulmonary edema in the setting of CHF/OSA, critical AS, and NSTEMI.  CTA chest negative for PE.  Pt was extubated on 11/8.  Pt's PMH includes CHF, morbid obesity.    PT Comments    [name] made good progress today and remains motivated.  Pt sat EOB ~8 minutes with focus on posture and therapeutic exercises.  She pivoted from the bed to the chair with mod assist to manage RW and max cues for upright posture.  Pt sat for ~1 minute due to increased WOB but then was agreeable to attempt ambulating in her room.  She ambulated 6 ft before reporting fatigue and requesting to sit. SpO2 remains at or above 91% on 3L O2 throughout session.     Follow Up Recommendations  CIR (SNF if pt not approved for CIR)     Equipment Recommendations  Other (comment) (Bariatric RW)    Recommendations for Other Services       Precautions / Restrictions Precautions Precautions: Other (comment);Fall Precaution Comments: monitor O2 Restrictions Weight Bearing Restrictions: No    Mobility  Bed Mobility Overal bed mobility: Needs Assistance;+2 for physical assistance Bed Mobility: Supine to Sit     Supine to sit: HOB elevated;Min guard     General bed mobility comments: No assist required today but increased time and effort with use of bed rail to pull up to sitting.  Pt reports dizziness with supine>sit which clears within 60 seconds.    Transfers Overall transfer level: Needs assistance Equipment used: Rolling walker (2 wheeled) Transfers: Sit to/from [facility] Sit to Stand: +2 physical assistance;Min assist Stand pivot transfers: +2 physical assistance;+2  safety/equipment;Mod assist       General transfer comment: Assist to boost to standing and to steady.  Cues for hand placement during sit<>stand and cues for upright once standing as pt demonstrates flexed posture.  Pt requires assist to manage RW and directional cues to chair.  Ambulation/Gait Ambulation/Gait assistance: Mod assist;+2 safety/equipment Ambulation Distance (Feet): 6 Feet Assistive device: Rolling walker (2 wheeled) Gait Pattern/deviations: Antalgic;Shuffle;Wide base of support;Trunk flexed;Decreased stride length Gait velocity: decreased Gait velocity interpretation: Below normal speed for age/gender General Gait Details: Max verbal cues for upright posture and forward gaze as well as for proper RW positioning as pt pushes it too far ahead.  Increased WOB while ambulating in room.  Assist to steady and for management of RW.  Pt fatigues after ambulating 6 ft.   Stairs            Wheelchair Mobility    Modified Rankin (Stroke Patients Only)       Balance Overall balance assessment: Needs assistance Sitting-balance support: Single extremity supported;Feet supported Sitting balance-Leahy Scale: Poor Sitting balance - Comments: Pt must have at least 1 UE supported while sitting EOB   Standing balance support: Bilateral upper extremity supported;During functional activity Standing balance-Leahy Scale: Poor Standing balance comment: Relies on RW and physical assist to steady                    Cognition Arousal/Alertness: Awake/alert Behavior During Therapy: WFL for tasks assessed/performed Overall Cognitive Status: Within Functional Limits for tasks assessed  Exercises General Exercises - Upper Extremity Shoulder Flexion: AROM;Both;10 reps;Seated General Exercises - Lower Extremity Ankle Circles/Pumps: AROM;Both;Supine;Seated;20 reps Long Arc Quad: AROM;Both;10 reps;Seated Heel Slides: AROM;Both;10 reps;Supine Hip  Flexion/Marching: Both;10 reps;Seated Other Exercises Other Exercises: Seated scapular retraction x10 with demonstration    General Comments General comments (skin integrity, edema, etc.): SpO2 remains at or above 91% on 3L O2 throughout session.      Pertinent Vitals/Pain Pain Assessment: No/denies pain    Home Living                      Prior Function            PT Goals (current goals can now be found in the care plan section) Acute Rehab PT Goals Patient Stated Goal: to get stronger PT Goal Formulation: With patient Time For Goal Achievement: 05/19/16 Potential to Achieve Goals: Good Progress towards PT goals: Progressing toward goals    Frequency    Min 2X/week      PT Plan Current plan remains appropriate    Co-evaluation             End of Session Equipment Utilized During Treatment: Gait belt;Oxygen Activity Tolerance: Patient limited by fatigue Patient left: with call bell/phone within reach;in chair;with chair alarm set     Time: 5615-3794 PT Time Calculation (min) (ACUTE ONLY): 38 min  Charges:  $Therapeutic Exercise: 8-22 mins $Therapeutic Activity: 8-22 mins                    G Codes:      Collie Siad PT, DPT 05/08/2016, 10:35 AM

## 2016-05-08 NOTE — Progress Notes (Signed)
LCSW has updated FL2 and clinicals reflecting accurate payor sources medicare and medicaid in effort to see if patient received bed offers.  Will follow up with patient and offers once available.  Will follow. Plan will be for [facility] SNF at this time.   Lane Hacker, MSW Clinical Social Work: [employer] Coverage for :  [phone]

## 2016-05-08 NOTE — Progress Notes (Signed)
Anticoagulation monitoring(Lovenox):  65 yo female ordered Lovenox 40 mg Q24h  Filed Weights   05/07/16 0500 05/07/16 0517 05/08/16 0537  Weight: (!) 302 lb 9.6 oz (137.3 kg) (!) 302 lb 9.6 oz (137.3 kg) 299 lb 9.6 oz (135.9 kg)  Body mass index is 44.24 kg/m.   Lab Results  Component Value Date   CREATININE 0.87 05/06/2016   CREATININE 0.99 05/04/2016   CREATININE 1.30 (H) 05/03/2016   Estimated Creatinine Clearance: 95.8 mL/min (by C-G formula based on SCr of 0.87 mg/dL). Hemoglobin & Hematocrit     Component Value Date/Time   HGB 9.8 (L) 05/08/2016 0544   HCT 30.2 (L) 05/08/2016 0544     Per Protocol for Patient with estCrcl > 30 ml/min and BMI > 40, will transition to Lovenox 40 mg Q12h. Discussed with hospitalist on rounds today as well.

## 2016-05-08 NOTE — Consult Note (Signed)
[facility] Infectious Disease     Reason for Consult: Strep bacteremia    Referring Physician: Boykin Reaper Date of Admission:  05/02/2016   Active Problems:   Acute respiratory failure with hypoxia and hypercapnia (HCC)   Pressure injury of skin   HPI: [name] is a 65 y.o. female admitted with severe resp distress. She has a hx of morbid obesity,OSA/OHS, hypertension, diabetes, severe aortic stenosis.  On admission was intubated, wbc was 29 and bcx + Grp B strep and ucx with E coli. She has hx of longstanding LE swelling and lymphedema managed recently with wraps and po abx.    Past Medical History:  Diagnosis Date  . CHF (congestive heart failure) ([place])   . Diabetes mellitus without complication ([place])   . Hypertension    History reviewed. No pertinent surgical history. Social History  Substance Use Topics  . Smoking status: Never Smoker  . Smokeless tobacco: Never Used  . Alcohol use No   History reviewed. No pertinent family history.  Allergies:  Allergies  Allergen Reactions  . Lisinopril     Other reaction(s): Unknown  . Niacin     Other reaction(s): Unknown  . Sitagliptin     Other reaction(s): Unknown  . Sulfamethoxazole-Trimethoprim     Other reaction(s): Unknown  . Tetanus Toxoids     Other reaction(s): Unknown    Current antibiotics: Antibiotics Given (last 72 hours)    Date/Time Action Medication Dose   05/05/16 1730 Given   cephALEXin (KEFLEX) capsule 1,000 mg 1,000 mg   05/06/16 0000 Given   cephALEXin (KEFLEX) capsule 1,000 mg 1,000 mg   05/06/16 5537 Given   cephALEXin (KEFLEX) capsule 1,000 mg 1,000 mg   05/06/16 1221 Given   cephALEXin (KEFLEX) capsule 1,000 mg 1,000 mg   05/06/16 1701 Given   cephALEXin (KEFLEX) capsule 1,000 mg 1,000 mg   05/06/16 2300 Given   cephALEXin (KEFLEX) capsule 1,000 mg 1,000 mg   05/07/16 0520 Given   cephALEXin (KEFLEX) capsule 1,000 mg 1,000 mg   05/07/16 1156 Given   cephALEXin (KEFLEX) capsule  1,000 mg 1,000 mg   05/07/16 1803 Given   cephALEXin (KEFLEX) capsule 1,000 mg 1,000 mg   05/08/16 0000 Given   cephALEXin (KEFLEX) capsule 1,000 mg 1,000 mg   05/08/16 0545 Given   cephALEXin (KEFLEX) capsule 1,000 mg 1,000 mg   05/08/16 1229 Given   cephALEXin (KEFLEX) capsule 1,000 mg 1,000 mg      MEDICATIONS: . aspirin EC  81 mg Oral Daily  . atorvastatin  40 mg Oral q1800  . cephALEXin  1,000 mg Oral Q6H  . enoxaparin (LOVENOX) injection  40 mg Subcutaneous Q12H  . fenofibrate  54 mg Oral Daily  . furosemide  40 mg Oral BID  . insulin aspart  0-20 Units Subcutaneous TID WC  . insulin aspart  0-5 Units Subcutaneous QHS  . insulin glargine  30 Units Subcutaneous Q2200  . ipratropium-albuterol  3 mL Nebulization Q6H  . LORazepam  2 mg Intravenous Once  . mouth rinse  15 mL Mouth Rinse BID  . metoprolol succinate  25 mg Oral Daily  . potassium chloride  40 mEq Oral Daily  . sodium chloride flush  10-40 mL Intracatheter Q12H    Review of Systems - 11 systems reviewed and negative per HPI   OBJECTIVE: Temp:  [97.9 F (36.6 C)-98.5 F (36.9 C)] 98.5 F (36.9 C) (11/13 1242) Pulse Rate:  [82-93] 93 (11/13 1242) Resp:  [18-20]  18 (11/13 1242) BP: (113-121)/(71-94) 117/94 (11/13 1242) SpO2:  [93 %-100 %] 98 % (11/13 1242) FiO2 (%):  [32 %] 32 % (11/13 0815) Weight:  [135.9 kg (299 lb 9.6 oz)] 135.9 kg (299 lb 9.6 oz) (11/13 0537) Physical Exam  Constitutional:  oriented to person, place, and time. mornidly obese HENT: [place]/AT, PERRLA, no scleral icterus Mouth/Throat: Oropharynx is clear and moist. No oropharyngeal exudate.  Cardiovascular: Normal rate, regular rhythm and normal heart sounds. Exam reveals no gallop and no friction rub.  No murmur heard.  Pulmonary/Chest: Effort normal and breath sounds normal. No respiratory distress.  has no wheezes.  Neck = supple, no nuchal rigidity Abdominal: Soft. Bowel sounds are normal.  exhibits no distension. There is no  tenderness.  Lymphadenopathy: no cervical adenopathy. No axillary adenopathy Neurological: alert and oriented to person, place, and time.  Skin: bil LE wrapped with unnawrap. Thin limbs, no apparent edema  Psychiatric: a normal mood and affect.  behavior is normal.   LABS: Results for orders placed or performed during the hospital encounter of 05/02/16 (from the past 48 hour(s))  Glucose, capillary     Status: Abnormal   Collection Time: 05/06/16  4:21 PM  Result Value Ref Range   Glucose-Capillary 179 (H) 65 - 99 mg/dL   Comment 1 Notify RN    Comment 2 Document in Chart   Glucose, capillary     Status: Abnormal   Collection Time: 05/06/16  9:57 PM  Result Value Ref Range   Glucose-Capillary 206 (H) 65 - 99 mg/dL  Potassium     Status: None   Collection Time: 05/07/16  5:18 AM  Result Value Ref Range   Potassium 3.6 3.5 - 5.1 mmol/L  Glucose, capillary     Status: Abnormal   Collection Time: 05/07/16  7:46 AM  Result Value Ref Range   Glucose-Capillary 189 (H) 65 - 99 mg/dL  Glucose, capillary     Status: Abnormal   Collection Time: 05/07/16 11:47 AM  Result Value Ref Range   Glucose-Capillary 228 (H) 65 - 99 mg/dL  Glucose, capillary     Status: Abnormal   Collection Time: 05/07/16  4:47 PM  Result Value Ref Range   Glucose-Capillary 176 (H) 65 - 99 mg/dL  Glucose, capillary     Status: Abnormal   Collection Time: 05/07/16  8:49 PM  Result Value Ref Range   Glucose-Capillary 199 (H) 65 - 99 mg/dL  Potassium     Status: None   Collection Time: 05/08/16  5:44 AM  Result Value Ref Range   Potassium 3.6 3.5 - 5.1 mmol/L  CBC     Status: Abnormal   Collection Time: 05/08/16  5:44 AM  Result Value Ref Range   WBC 7.6 3.6 - 11.0 K/uL   RBC 3.65 (L) 3.80 - 5.20 MIL/uL   Hemoglobin 9.8 (L) 12.0 - 16.0 g/dL   HCT 30.2 (L) 35.0 - 47.0 %   MCV 82.7 80.0 - 100.0 fL   MCH 27.0 26.0 - 34.0 pg   MCHC 32.6 32.0 - 36.0 g/dL   RDW 15.2 (H) 11.5 - 14.5 %   Platelets 258 150 - 440  K/uL  Glucose, capillary     Status: Abnormal   Collection Time: 05/08/16  7:25 AM  Result Value Ref Range   Glucose-Capillary 185 (H) 65 - 99 mg/dL  Glucose, capillary     Status: Abnormal   Collection Time: 05/08/16 12:20 PM  Result Value Ref Range   Glucose-Capillary  232 (H) 65 - 99 mg/dL   Comment 1 Notify RN    Comment 2 Document in Chart    No components found for: ESR, C REACTIVE PROTEIN MICRO: Recent Results (from the past 720 hour(s))  Blood culture (routine x 2)     Status: Abnormal   Collection Time: 05/02/16  9:22 AM  Result Value Ref Range Status   Specimen Description BLOOD LEFT ASSIST CONTROL  Final   Special Requests BOTTLES DRAWN AEROBIC AND ANAEROBIC  6CC  Final   Culture  Setup Time   Final    GRAM POSITIVE COCCI IN BOTH AEROBIC AND ANAEROBIC BOTTLES CRITICAL RESULT CALLED TO, READ BACK BY AND VERIFIED WITH: JASON ROBBINS 05/02/16 @ 2125  [place]    Culture (A)  Final    GROUP B STREP(S.AGALACTIAE)ISOLATED Virtually 100% of S. agalactiae (Group B) strains are susceptible to Penicillin.  For Penicillin-allergic patients, Erythromycin (85-95% sensitive) and Clindamycin (80% sensitive) are drugs of choice. Contact microbiology lab to request sensitivities if  needed within 7 days. Performed at [facility]    Report Status 05/04/2016 FINAL  Final   Organism ID, Bacteria GROUP B STREP(S.AGALACTIAE)ISOLATED  Final      Susceptibility   Group b strep(s.agalactiae)isolated - MIC*    CLINDAMYCIN <=0.25 SENSITIVE Sensitive     AMPICILLIN <=0.25 SENSITIVE Sensitive     ERYTHROMYCIN <=0.12 SENSITIVE Sensitive     VANCOMYCIN 0.5 SENSITIVE Sensitive     CEFTRIAXONE <=0.12 SENSITIVE Sensitive     LEVOFLOXACIN 1 SENSITIVE Sensitive     * GROUP B STREP(S.AGALACTIAE)ISOLATED  Blood Culture ID Panel (Reflexed)     Status: Abnormal   Collection Time: 05/02/16  9:22 AM  Result Value Ref Range Status   Enterococcus species NOT DETECTED NOT DETECTED Final   Listeria  monocytogenes NOT DETECTED NOT DETECTED Final   Staphylococcus species NOT DETECTED NOT DETECTED Final   Staphylococcus aureus NOT DETECTED NOT DETECTED Final   Streptococcus species DETECTED (A) NOT DETECTED Final    Comment: CRITICAL RESULT CALLED TO, READ BACK BY AND VERIFIED WITH: JASON ROBBINS 05/02/16 @ 2125  [place]    Streptococcus agalactiae DETECTED (A) NOT DETECTED Final    Comment: CRITICAL RESULT CALLED TO, READ BACK BY AND VERIFIED WITH: JASON ROBBINS 05/02/16 @ 2125  [place]    Streptococcus pneumoniae NOT DETECTED NOT DETECTED Final   Streptococcus pyogenes NOT DETECTED NOT DETECTED Final   Acinetobacter baumannii NOT DETECTED NOT DETECTED Final   Enterobacteriaceae species NOT DETECTED NOT DETECTED Final   Enterobacter cloacae complex NOT DETECTED NOT DETECTED Final   Escherichia coli NOT DETECTED NOT DETECTED Final   Klebsiella oxytoca NOT DETECTED NOT DETECTED Final   Klebsiella pneumoniae NOT DETECTED NOT DETECTED Final   Proteus species NOT DETECTED NOT DETECTED Final   Serratia marcescens NOT DETECTED NOT DETECTED Final   Haemophilus influenzae NOT DETECTED NOT DETECTED Final   Neisseria meningitidis NOT DETECTED NOT DETECTED Final   Pseudomonas aeruginosa NOT DETECTED NOT DETECTED Final   Candida albicans NOT DETECTED NOT DETECTED Final   Candida glabrata NOT DETECTED NOT DETECTED Final   Candida krusei NOT DETECTED NOT DETECTED Final   Candida parapsilosis NOT DETECTED NOT DETECTED Final   Candida tropicalis NOT DETECTED NOT DETECTED Final  Urine culture     Status: Abnormal   Collection Time: 05/02/16  9:55 AM  Result Value Ref Range Status   Specimen Description URINE, RANDOM  Final   Special Requests NONE  Final   Culture >=100,000  COLONIES/mL ESCHERICHIA COLI (A)  Final   Report Status 05/04/2016 FINAL  Final   Organism ID, Bacteria ESCHERICHIA COLI (A)  Final      Susceptibility   Escherichia coli - MIC*    AMPICILLIN <=2 SENSITIVE Sensitive     CEFAZOLIN  <=4 SENSITIVE Sensitive     CEFTRIAXONE <=1 SENSITIVE Sensitive     CIPROFLOXACIN <=0.25 SENSITIVE Sensitive     GENTAMICIN <=1 SENSITIVE Sensitive     IMIPENEM <=0.25 SENSITIVE Sensitive     NITROFURANTOIN <=16 SENSITIVE Sensitive     TRIMETH/SULFA <=20 SENSITIVE Sensitive     AMPICILLIN/SULBACTAM <=2 SENSITIVE Sensitive     PIP/TAZO <=4 SENSITIVE Sensitive     Extended ESBL NEGATIVE Sensitive     * >=100,000 COLONIES/mL ESCHERICHIA COLI  MRSA PCR Screening     Status: None   Collection Time: 05/02/16 11:04 AM  Result Value Ref Range Status   MRSA by PCR NEGATIVE NEGATIVE Final    Comment:        The GeneXpert MRSA Assay (FDA approved for NASAL specimens only), is one component of a comprehensive MRSA colonization surveillance program. It is not intended to diagnose MRSA infection nor to guide or monitor treatment for MRSA infections.   Blood culture (routine x 2)     Status: None   Collection Time: 05/02/16  3:27 PM  Result Value Ref Range Status   Specimen Description BLOOD LEFT HAND  Final   Special Requests [facility] [place]  Final   Culture NO GROWTH 5 DAYS  Final   Report Status 05/07/2016 FINAL  Final    IMAGING: Dg Abdomen 1 View  Result Date: 05/02/2016 CLINICAL DATA:  Hypoxia. EXAM: ABDOMEN - 1 VIEW COMPARISON:  No recent prior. FINDINGS: NG tube noted coiled stomach. Soft tissue structures are unremarkable air-filled loops of colon and possibly small bowel noted. Mild adynamic ileus cannot be excluded. No free air. Nodular bilateral pulmonary infiltrates noted, reference made to today's chest x-ray report. IMPRESSION: NG tube noted coiled stomach. Mild adynamic ileus cannot be excluded . Electronically Signed   By: Marcello Moores  Register   On: 05/02/2016 10:04   Ct Angio Chest Pe W And/or Wo Contrast  Result Date: 05/02/2016 CLINICAL DATA:  Hypoxia and tachycardia EXAM: CT ANGIOGRAPHY CHEST WITH CONTRAST TECHNIQUE: Multidetector CT imaging of the chest was performed  using the standard protocol during bolus administration of intravenous contrast. Multiplanar CT image reconstructions and MIPs were obtained to evaluate the vascular anatomy. CONTRAST:  60 mL Isovue 370 nonionic COMPARISON:  Chest radiograph May 02, 2016 FINDINGS: Cardiovascular: There is no demonstrable pulmonary embolus. The ascending thoracic aortic diameter is 4.3 x 4.2 cm. There is no thoracic aortic dissection. The visualized great vessels appear unremarkable. There is cardiomegaly. The pericardium is not appreciably thickened. There is rather minimal coronary artery calcification evident. Mediastinum/Nodes: Visualized thyroid appears unremarkable. There are scattered sub cm mediastinal lymph nodes but no adenopathy evident. Endotracheal tube is present with the tip in the distal trachea. Nasogastric tube extends into the stomach. Lungs/Pleura: There is widespread interstitial and alveolar edema throughout the lungs. There is airspace consolidation in both lower lobes with small pleural effusions bilaterally. Upper Abdomen: Visualized upper abdominal structures appear unremarkable. Note that there is a degree of fatty infiltration in the pancreas. Musculoskeletal: There are no blastic or lytic bone lesions. Review of the MIP images confirms the above findings. IMPRESSION: No demonstrable pulmonary embolus. Ascending thoracic aorta measures 4.3 x 4.2 cm. Recommend annual imaging followup by  CTA or MRA. This recommendation follows 2010 ACCF/AHA/AATS/ACR/ASA/SCA/SCAI/SIR/STS/SVM Guidelines for the Diagnosis and Management of Patients with Thoracic Aortic Disease. Circulation. 2010; 121: N562-Z308. No thoracic aortic dissection. Extensive interstitial and alveolar edema with bibasilar consolidation and small pleural effusions. Suspect congestive heart failure, although superimposed pneumonia and/ or ARDS may be present. More than one of these entities may be present concurrently. Heart is enlarged. There is  rather minimal coronary artery calcification evident. Patient is intubated. Nasogastric tube tip in stomach. No pneumothorax. Electronically Signed   By: Lowella Grip III M.D.   On: 05/02/2016 12:21   Dg Chest Port 1 View  Result Date: 05/03/2016 CLINICAL DATA:  Respiratory failure. EXAM: PORTABLE CHEST 1 VIEW COMPARISON:  05/02/2016 FINDINGS: Endotracheal tube tip measures 2.1 cm above the carina. Right central venous catheter tip over the cavoatrial junction. Enteric tube tip is off the field of view but below the left hemidiaphragm. Cardiac enlargement with mild vascular congestion. Improving perihilar infiltrates since previous study. No blunting of costophrenic angles. No pneumothorax. IMPRESSION: Appliances appear in satisfactory position. Improving perihilar infiltrates since previous study. Electronically Signed   By: Lucienne Capers M.D.   On: 05/03/2016 06:29   Dg Chest Portable 1 View  Result Date: 05/02/2016 CLINICAL DATA:  Status post central line placement EXAM: PORTABLE CHEST 1 VIEW COMPARISON:  Portable chest X ray of 9:37 a.m. of today's date. FINDINGS: There has been interval placement of right internal jugular venous catheter. The tip of the catheter projects over the distal third of the SVC. The endotracheal tube tip lies 2.9 cm above the carina. The esophagogastric tube tip projects below the inferior margin of the image. There are fluffy alveolar opacities bilaterally which are more conspicuous today. The lungs are mildly hypoinflated. The cardiac silhouette is enlarged. The pulmonary vascularity is engorged. IMPRESSION: Right internal jugular venous catheter tip projecting over the distal SVC. No postprocedure complication following placement. Worsening alveolar opacities especially on the left consistent with increasing alveolar edema or pneumonia. Electronically Signed   By: Kymberli Wiegand  [place] M.D.   On: 05/02/2016 13:47   Dg Chest Port 1 View  Result Date: 05/02/2016 CLINICAL  DATA:  Hypoxia EXAM: PORTABLE CHEST 1 VIEW COMPARISON:  None. FINDINGS: Endotracheal tube tip is 3.1 cm above the carina. Nasogastric tube tip and side port are below the diaphragm. No pneumothorax. There is widespread airspace consolidation throughout the right lung. There is moderate underlying interstitial edema. Heart is mildly enlarged with pulmonary vascularity within normal limits. No evident adenopathy. IMPRESSION: Tube positions as described without pneumothorax. Evidence of underlying congestive heart failure. Probable multifocal pneumonia on the right superimposed on edema, although the opacity on the right could be due to alveolar edema in a somewhat atypical distribution. Both pneumonia and alveolar edema may present concurrently. Electronically Signed   By: Lowella Grip III M.D.   On: 05/02/2016 10:08    Assessment:   [name] is a 65 y.o. female admitted with acute respiratory failure and found to have Grp B strep bacteremia likely from LE cellulitis, and E coli UTI. Clinically she is much improved but still on O2.  Her legs are in wraps, WBC down 28 to 7.6. No fevers. On keflex po.   Recommendations Can treat with oral keflex for 10 days. Should continue wraps as she has been.  I can see in clinic if needed in 2 weeks to reassess legs and consider suppressive therapy for recurrent cellulitis Thank you very much for allowing me to participate  in the care of this patient. Please call with questions.   Cheral Marker. Ola Spurr, MD

## 2016-05-08 NOTE — Progress Notes (Signed)
Inpatient Diabetes Program Recommendations  AACE/ADA: New Consensus Statement on Inpatient Glycemic Control (2015)  Target Ranges:  Prepandial:   less than 140 mg/dL      [facility] postprandial:   less than 180 mg/dL (1-2 hours)      Critically ill patients:  140 - 180 mg/dL   Results for BELLAGRACE, SYLVAN (MRN [id number]) as of 05/08/2016 08:54  Ref. Range 05/07/2016 07:46 05/07/2016 11:47 05/07/2016 16:47 05/07/2016 20:49  Glucose-Capillary Latest Ref Range: 65 - 99 mg/dL 189 (H) 228 (H) 176 (H) 199 (H)   Results for LIYAH, HIGHAM (MRN [id number]) as of 05/08/2016 08:54  Ref. Range 05/08/2016 07:25  Glucose-Capillary Latest Ref Range: 65 - 99 mg/dL 185 (H)    Home DM Meds: Lantus 50-54 units QHS       Metformin 1000 mg BID  Current Insulin Orders: Lantus 20 units QHS      Novolog Resistant Correction Scale/ SSI (0-20 units) TID AC + HS       MD- Please consider the following in-hospital insulin adjustments:  Increase Lantus to 30 units QHS (60% home dose)     --Will follow patient during hospitalization--  Wyn Quaker RN, MSN, CDE Diabetes Coordinator Inpatient Glycemic Control Team Team Pager: [phone] (8a-5p)

## 2016-05-08 NOTE — NC FL2 (Signed)
[place] LEVEL OF CARE SCREENING TOOL     IDENTIFICATION  Patient Name: [name] Birthdate: April 27, 1951 Sex: female Admission Date (Current Location): 05/02/2016  [place] and [place] Number:  Selena Lesser [id number] [place] and Address:  [facility], [address], [place], [place] [address]      Provider Number: [id number]  Attending Physician Name and Address:  Hillary Bow, MD  Relative Name and Phone Number:       Current Level of Care: Hospital Recommended Level of Care: [place] Prior Approval Number:    Date Approved/Denied: 05/06/16 PASRR Number: [id number]  Discharge Plan: SNF    Current Diagnoses: Patient Active Problem List   Diagnosis Date Noted  . Acute respiratory failure with hypoxia and hypercapnia ([place]) 05/02/2016  . Pressure injury of skin 05/02/2016  . Lactic acidosis   . Severe aortic stenosis   . OSA on CPAP   . Morbid obesity ([place])   . Community acquired pneumonia     Orientation RESPIRATION BLADDER Height & Weight     Self, Time, Situation, Place  O2 (3L) Incontinent Weight: 299 lb 9.6 oz (135.9 kg) Height:  5\' 9"  (175.3 cm)  BEHAVIORAL SYMPTOMS/MOOD NEUROLOGICAL BOWEL NUTRITION STATUS      Continent  SOFT diet  AMBULATORY STATUS COMMUNICATION OF NEEDS Skin   Extensive Assist Verbally Normal                       Personal Care Assistance Level of Assistance  Bathing Bathing Assistance: Limited assistance   Dressing Assistance: Limited assistance     Functional Limitations Info  Sight, Hearing, Speech Sight Info: Adequate Hearing Info: Adequate Speech Info: Adequate    SPECIAL CARE FACTORS FREQUENCY  PT (By licensed PT), OT (By licensed OT)     PT Frequency: 5x OT Frequency: 5x            Contractures Contractures Info: Not present    Additional Factors Info  Code Status, Allergies Code Status Info: DNR Allergies Info: Lisinopril, Niacin,  Sitagliptin, Sulfamethoxazole-trimethoprim, Tetanus Toxoids           Current Medications (05/08/2016):  This is the current hospital active medication list Current Facility-Administered Medications  Medication Dose Route Frequency Provider Last Rate Last Dose  . 0.9 %  sodium chloride infusion  250 mL Intravenous PRN Vishal Mungal, MD      . acetaminophen (TYLENOL) tablet 650 mg  650 mg Oral Q4H PRN Vishal Mungal, MD   650 mg at 05/07/16 0531  . aspirin EC tablet 81 mg  81 mg Oral Daily Vishal Mungal, MD   81 mg at 05/08/16 0757  . atorvastatin (LIPITOR) tablet 40 mg  40 mg Oral q1800 Vishal Mungal, MD   40 mg at 05/07/16 1803  . cephALEXin (KEFLEX) capsule 1,000 mg  1,000 mg Oral Q6H Vishal Mungal, MD   1,000 mg at 05/08/16 1229  . enoxaparin (LOVENOX) injection 40 mg  40 mg Subcutaneous Q24H Vishal Mungal, MD   40 mg at 05/08/16 0758  . fenofibrate tablet 54 mg  54 mg Oral Daily Fritzi Mandes, MD   54 mg at 05/08/16 0757  . furosemide (LASIX) tablet 40 mg  40 mg Oral BID Srikar Sudini, MD      . insulin aspart (novoLOG) injection 0-20 Units  0-20 Units Subcutaneous TID WC Vishal Mungal, MD   7 Units at 05/08/16 1226  . insulin aspart (novoLOG) injection 0-5 Units  0-5 Units  Subcutaneous QHS Vilinda Boehringer, MD   2 Units at 05/06/16 2250  . insulin glargine (LANTUS) injection 30 Units  30 Units Subcutaneous Q2200 Srikar Sudini, MD      . ipratropium-albuterol (DUONEB) 0.5-2.5 (3) MG/3ML nebulizer solution 3 mL  3 mL Nebulization Q6H Vishal Mungal, MD   3 mL at 05/08/16 0815  . LORazepam (ATIVAN) injection 2 mg  2 mg Intravenous Once Earleen Newport, MD      . MEDLINE mouth rinse  15 mL Mouth Rinse BID Vishal Mungal, MD   15 mL at 05/08/16 0800  . metoprolol succinate (TOPROL-XL) 24 hr tablet 25 mg  25 mg Oral Daily Isaias Cowman, MD   25 mg at 05/08/16 0757  . ondansetron (ZOFRAN) injection 4 mg  4 mg Intravenous Q6H PRN Vishal Mungal, MD      . potassium chloride SA (K-DUR,KLOR-CON)  CR tablet 40 mEq  40 mEq Oral Daily Hillary Bow, MD   40 mEq at 05/08/16 1055  . sodium chloride flush (NS) 0.9 % injection 10-40 mL  10-40 mL Intracatheter Q12H Vishal Mungal, MD   10 mL at 05/08/16 0758  . sodium chloride flush (NS) 0.9 % injection 10-40 mL  10-40 mL Intracatheter PRN Vilinda Boehringer, MD         Discharge Medications: Please see discharge summary for a list of discharge medications.  Relevant Imaging Results:  Relevant Lab Results:   Additional Information SSN: [id number]   [place], [place]

## 2016-05-08 NOTE — Progress Notes (Addendum)
I spoke with pt by phone to discuss her preference for rehab facility. I explained inpt rehab admission at [facility] in [place] and intensity of therapy. She and her cousin feel she is not at a level to do inpt acute rehab. They prefer SNF rehab level of therapy and are requesting [facility]. I will contact RN CM to discuss. Please call me with any questions. [phone]

## 2016-05-09 LAB — BASIC METABOLIC PANEL
Anion gap: 5 (ref 5–15)
BUN: 26 mg/dL — ABNORMAL HIGH (ref 6–20)
CHLORIDE: 106 mmol/L (ref 101–111)
CO2: 32 mmol/L (ref 22–32)
Calcium: 8.1 mg/dL — ABNORMAL LOW (ref 8.9–10.3)
Creatinine, Ser: 0.79 mg/dL (ref 0.44–1.00)
GFR calc non Af Amer: >60 mL/min (ref >60)
Glucose, Bld: 171 mg/dL — ABNORMAL HIGH (ref 65–99)
POTASSIUM: 3.5 mmol/L (ref 3.5–5.1)
SODIUM: 143 mmol/L (ref 135–145)

## 2016-05-09 LAB — GLUCOSE, CAPILLARY
GLUCOSE-CAPILLARY: 165 mg/dL — AB (ref 65–99)
Glucose-Capillary: 198 mg/dL — ABNORMAL HIGH (ref 65–99)
Glucose-Capillary: 172 mg/dL — ABNORMAL HIGH (ref 65–99)

## 2016-05-09 MED ORDER — ATORVASTATIN CALCIUM 40 MG PO TABS: 40.0000 mg | ORAL_TABLET | Freq: Every day | ORAL | 0 refills | Status: DC

## 2016-05-09 MED ORDER — INSULIN ASPART 100 UNIT/ML ~~LOC~~ SOLN: 0.0000 [IU] | Freq: Three times a day (TID) | SUBCUTANEOUS | 11 refills | Status: DC

## 2016-05-09 MED ORDER — CEPHALEXIN 500 MG PO CAPS: 1000.0000 mg | ORAL_CAPSULE | Freq: Four times a day (QID) | ORAL | 0 refills | Status: DC

## 2016-05-09 MED ORDER — FUROSEMIDE 40 MG PO TABS: 40.0000 mg | ORAL_TABLET | Freq: Two times a day (BID) | ORAL | 0 refills | Status: DC

## 2016-05-09 MED ORDER — LACTULOSE 10 GM/15ML PO SOLN
30.0000 g | Freq: Once | ORAL | Status: DC
Start: 2016-05-09 — End: 2016-05-09
  Filled 2016-05-09: qty 60

## 2016-05-09 MED ORDER — DOCUSATE SODIUM 100 MG PO CAPS
100.0000 mg | ORAL_CAPSULE | Freq: Two times a day (BID) | ORAL | Status: DC
  Administered 2016-05-09: 100 mg via ORAL
  Filled 2016-05-09: qty 1

## 2016-05-09 MED ORDER — METOPROLOL SUCCINATE ER 25 MG PO TB24: 25.0000 mg | ORAL_TABLET | Freq: Every day | ORAL | 0 refills | Status: DC

## 2016-05-09 MED ORDER — INSULIN ASPART 100 UNIT/ML ~~LOC~~ SOLN: 0.0000 [IU] | Freq: Every day | SUBCUTANEOUS | 11 refills | Status: DC

## 2016-05-09 MED ORDER — DOCUSATE SODIUM 100 MG PO CAPS: 100.0000 mg | ORAL_CAPSULE | Freq: Two times a day (BID) | ORAL | 0 refills | Status: DC

## 2016-05-09 NOTE — Progress Notes (Signed)
Nutrition Follow-up  DOCUMENTATION CODES:   Morbid obesity  INTERVENTION:  -Monitor PO intake  NUTRITION DIAGNOSIS:   Inadequate oral intake related to acute illness as evidenced by NPO status. -resolved  GOAL:   Patient will meet greater than or equal to 90% of their needs -meetign  MONITOR:   PO intake, Labs, Weight trends  REASON FOR ASSESSMENT:   Consult Assessment of nutrition requirement/status  ASSESSMENT:   65 yo female admitted with acute respiratory failure requiring intubation in ER on 11/7. Pt with hx of severe aortic stenosis, OSA, morbid obesity, DM and HL  Pt consuming 100% thus far for past 6 meals. D/C today No complaints.  Diet Order:  DIET SOFT Room service appropriate? Yes; Fluid consistency: Thin  Skin:   (stage I buttock)  Last BM:  no documented BM  Height:   Ht Readings from Last 1 Encounters:  05/02/16 5\' 9"  (1.753 m)    Weight:   Wt Readings from Last 1 Encounters:  05/09/16 299 lb 3.2 oz (135.7 kg)    Ideal Body Weight:     BMI:  Body mass index is 44.18 kg/m.  Estimated Nutritional Needs:   Kcal:  2000-2200 kcals   Protein:  125-140 g  Fluid:  >/= 1.5 L  EDUCATION NEEDS:   No education needs identified at this time  Satira Anis. Randal Goens, MS, RD LDN Inpatient Clinical Dietitian Pager [phone]

## 2016-05-09 NOTE — Progress Notes (Signed)
EMS picking up patient now. Tele box removed. Central line removed earlier per policy guidelines. Report called to [facility] RN earlier.

## 2016-05-09 NOTE — Progress Notes (Signed)
[facility] INFECTIOUS DISEASE PROGRESS NOTE Date of Admission:  05/02/2016     ID: [name] is a 65 y.o. female with  Grp strep bacteremia, cellulits and  uti  Active Problems:   Acute respiratory failure with hypoxia and hypercapnia (HCC)   Pressure injury of skin   Subjective: No fevers chills,   ROS  Eleven systems are reviewed and negative except per hpi  Medications:  Antibiotics Given (last 72 hours)    Date/Time Action Medication Dose   05/06/16 1701 Given   cephALEXin (KEFLEX) capsule 1,000 mg 1,000 mg   05/06/16 2300 Given   cephALEXin (KEFLEX) capsule 1,000 mg 1,000 mg   05/07/16 0520 Given   cephALEXin (KEFLEX) capsule 1,000 mg 1,000 mg   05/07/16 1156 Given   cephALEXin (KEFLEX) capsule 1,000 mg 1,000 mg   05/07/16 1803 Given   cephALEXin (KEFLEX) capsule 1,000 mg 1,000 mg   05/08/16 0000 Given   cephALEXin (KEFLEX) capsule 1,000 mg 1,000 mg   05/08/16 0545 Given   cephALEXin (KEFLEX) capsule 1,000 mg 1,000 mg   05/08/16 1229 Given   cephALEXin (KEFLEX) capsule 1,000 mg 1,000 mg   05/08/16 1659 Given   cephALEXin (KEFLEX) capsule 1,000 mg 1,000 mg   05/08/16 2311 Given   cephALEXin (KEFLEX) capsule 1,000 mg 1,000 mg   05/09/16 6237 Given   cephALEXin (KEFLEX) capsule 1,000 mg 1,000 mg   05/09/16 1200 Given   cephALEXin (KEFLEX) capsule 1,000 mg 1,000 mg     . aspirin EC  81 mg Oral Daily  . atorvastatin  40 mg Oral q1800  . cephALEXin  1,000 mg Oral Q6H  . docusate sodium  100 mg Oral BID  . enoxaparin (LOVENOX) injection  40 mg Subcutaneous Q12H  . fenofibrate  54 mg Oral Daily  . furosemide  40 mg Oral BID  . insulin aspart  0-20 Units Subcutaneous TID WC  . insulin aspart  0-5 Units Subcutaneous QHS  . insulin glargine  30 Units Subcutaneous Q2200  . ipratropium-albuterol  3 mL Nebulization Q6H  . lactulose  30 g Oral Once  . LORazepam  2 mg Intravenous Once  . mouth rinse  15 mL Mouth Rinse BID  . metoprolol succinate  25 mg Oral  Daily  . potassium chloride  40 mEq Oral Daily  . sodium chloride flush  10-40 mL Intracatheter Q12H    Objective: Vital signs in last 24 hours: Temp:  [98 F (36.7 C)-98.2 F (36.8 C)] 98.2 F (36.8 C) (11/14 1100) Pulse Rate:  [89-96] 92 (11/14 1330) Resp:  [19-27] 27 (11/14 0353) BP: (112-116)/(56-69) 116/58 (11/14 1100) SpO2:  [94 %-100 %] 95 % (11/14 1330) Weight:  [135.7 kg (299 lb 3.2 oz)] 135.7 kg (299 lb 3.2 oz) (11/14 0353) Constitutional:  oriented to person, place, and time. mornidly obese HENT: [place]/AT, PERRLA, no scleral icterus Mouth/Throat: Oropharynx is clear and moist. No oropharyngeal exudate.  Cardiovascular: Normal rate, regular rhythm and normal heart sounds. Exam reveals no gallop and no friction rub.  No murmur heard.  Pulmonary/Chest: Effort normal and breath sounds normal. No respiratory distress.  has no wheezes.  Neck = supple, no nuchal rigidity Abdominal: Soft. Bowel sounds are normal.  exhibits no distension. There is no tenderness.  Lymphadenopathy: no cervical adenopathy. No axillary adenopathy Neurological: alert and oriented to person, place, and time.  Skin: bil LE wrapped with unnawrap. Thin limbs, no apparent edema  Psychiatric: a normal mood and affect.  behavior is normal.  Lab Results  Recent Labs  05/08/16 0544 05/09/16 0629  WBC 7.6  --   HGB 9.8*  --   HCT 30.2*  --   NA  --  143  K 3.6 3.5  CL  --  106  CO2  --  32  BUN  --  26*  CREATININE  --  0.79    Microbiology: Results for orders placed or performed during the hospital encounter of 05/02/16  Blood culture (routine x 2)     Status: Abnormal   Collection Time: 05/02/16  9:22 AM  Result Value Ref Range Status   Specimen Description BLOOD LEFT ASSIST CONTROL  Final   Special Requests BOTTLES DRAWN AEROBIC AND ANAEROBIC  6CC  Final   Culture  Setup Time   Final    GRAM POSITIVE COCCI IN BOTH AEROBIC AND ANAEROBIC BOTTLES CRITICAL RESULT CALLED TO, READ BACK BY AND  VERIFIED WITH: JASON ROBBINS 05/02/16 @ 2125  [place]    Culture (A)  Final    GROUP B STREP(S.AGALACTIAE)ISOLATED Virtually 100% of S. agalactiae (Group B) strains are susceptible to Penicillin.  For Penicillin-allergic patients, Erythromycin (85-95% sensitive) and Clindamycin (80% sensitive) are drugs of choice. Contact microbiology lab to request sensitivities if  needed within 7 days. Performed at [facility]    Report Status 05/04/2016 FINAL  Final   Organism ID, Bacteria GROUP B STREP(S.AGALACTIAE)ISOLATED  Final      Susceptibility   Group b strep(s.agalactiae)isolated - MIC*    CLINDAMYCIN <=0.25 SENSITIVE Sensitive     AMPICILLIN <=0.25 SENSITIVE Sensitive     ERYTHROMYCIN <=0.12 SENSITIVE Sensitive     VANCOMYCIN 0.5 SENSITIVE Sensitive     CEFTRIAXONE <=0.12 SENSITIVE Sensitive     LEVOFLOXACIN 1 SENSITIVE Sensitive     * GROUP B STREP(S.AGALACTIAE)ISOLATED  Blood Culture ID Panel (Reflexed)     Status: Abnormal   Collection Time: 05/02/16  9:22 AM  Result Value Ref Range Status   Enterococcus species NOT DETECTED NOT DETECTED Final   Listeria monocytogenes NOT DETECTED NOT DETECTED Final   Staphylococcus species NOT DETECTED NOT DETECTED Final   Staphylococcus aureus NOT DETECTED NOT DETECTED Final   Streptococcus species DETECTED (A) NOT DETECTED Final    Comment: CRITICAL RESULT CALLED TO, READ BACK BY AND VERIFIED WITH: JASON ROBBINS 05/02/16 @ 2125  [facility]    Streptococcus agalactiae DETECTED (A) NOT DETECTED Final    Comment: CRITICAL RESULT CALLED TO, READ BACK BY AND VERIFIED WITH: JASON ROBBINS 05/02/16 @ 2125  [place]    Streptococcus pneumoniae NOT DETECTED NOT DETECTED Final   Streptococcus pyogenes NOT DETECTED NOT DETECTED Final   Acinetobacter baumannii NOT DETECTED NOT DETECTED Final   Enterobacteriaceae species NOT DETECTED NOT DETECTED Final   Enterobacter cloacae complex NOT DETECTED NOT DETECTED Final   Escherichia coli NOT DETECTED NOT DETECTED Final    Klebsiella oxytoca NOT DETECTED NOT DETECTED Final   Klebsiella pneumoniae NOT DETECTED NOT DETECTED Final   Proteus species NOT DETECTED NOT DETECTED Final   Serratia marcescens NOT DETECTED NOT DETECTED Final   Haemophilus influenzae NOT DETECTED NOT DETECTED Final   Neisseria meningitidis NOT DETECTED NOT DETECTED Final   Pseudomonas aeruginosa NOT DETECTED NOT DETECTED Final   Candida albicans NOT DETECTED NOT DETECTED Final   Candida glabrata NOT DETECTED NOT DETECTED Final   Candida krusei NOT DETECTED NOT DETECTED Final   Candida parapsilosis NOT DETECTED NOT DETECTED Final   Candida tropicalis NOT DETECTED NOT DETECTED Final  Urine  culture     Status: Abnormal   Collection Time: 05/02/16  9:55 AM  Result Value Ref Range Status   Specimen Description URINE, RANDOM  Final   Special Requests NONE  Final   Culture >=100,000 COLONIES/mL ESCHERICHIA COLI (A)  Final   Report Status 05/04/2016 FINAL  Final   Organism ID, Bacteria ESCHERICHIA COLI (A)  Final      Susceptibility   Escherichia coli - MIC*    AMPICILLIN <=2 SENSITIVE Sensitive     CEFAZOLIN <=4 SENSITIVE Sensitive     CEFTRIAXONE <=1 SENSITIVE Sensitive     CIPROFLOXACIN <=0.25 SENSITIVE Sensitive     GENTAMICIN <=1 SENSITIVE Sensitive     IMIPENEM <=0.25 SENSITIVE Sensitive     NITROFURANTOIN <=16 SENSITIVE Sensitive     TRIMETH/SULFA <=20 SENSITIVE Sensitive     AMPICILLIN/SULBACTAM <=2 SENSITIVE Sensitive     PIP/TAZO <=4 SENSITIVE Sensitive     Extended ESBL NEGATIVE Sensitive     * >=100,000 COLONIES/mL ESCHERICHIA COLI  MRSA PCR Screening     Status: None   Collection Time: 05/02/16 11:04 AM  Result Value Ref Range Status   MRSA by PCR NEGATIVE NEGATIVE Final    Comment:        The GeneXpert MRSA Assay (FDA approved for NASAL specimens only), is one component of a comprehensive MRSA colonization surveillance program. It is not intended to diagnose MRSA infection nor to guide or monitor treatment  for MRSA infections.   Blood culture (routine x 2)     Status: None   Collection Time: 05/02/16  3:27 PM  Result Value Ref Range Status   Specimen Description BLOOD LEFT HAND  Final   Special Requests BAA [place]  Final   Culture NO GROWTH 5 DAYS  Final   Report Status 05/07/2016 FINAL  Final    Studies/Results: No results found.  Assessment/Plan: [name] is a 65 y.o. female admitted with acute respiratory failure and found to have Grp B strep bacteremia likely from LE cellulitis, and E coli UTI. Clinically she is much improved but still on O2.  Her legs are in wraps, WBC down 28 to 7.6. No fevers. On keflex po.   Recommendations Can treat with oral keflex for 10 days. Should continue wraps as she has been.  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 Will follow with you.  [place], Windel Keziah P   05/09/2016, 3:18 PM

## 2016-05-09 NOTE — Progress Notes (Addendum)
Patient has not had a BM in 4 days. Dr. Vianne Bulls notified. Orders for colace 100mg  BID and lactulose 30g once

## 2016-05-09 NOTE — Progress Notes (Signed)
Everything ready for d/c per CSW, Jarrett Soho. Dr. Vianne Bulls notifed - instructed RN to place d/c order, remove central line and request appointments in 2 weeks for Dr. Saralyn Pilar and Dr. Ola Spurr.

## 2016-05-09 NOTE — Progress Notes (Signed)
Pharmacy Consult for electrolyte replacement   Allergies  Allergen Reactions  . Lisinopril     Other reaction(s): Unknown  . Niacin     Other reaction(s): Unknown  . Sitagliptin     Other reaction(s): Unknown  . Sulfamethoxazole-Trimethoprim     Other reaction(s): Unknown  . Tetanus Toxoids     Other reaction(s): Unknown    Patient Measurements: Height: 5\' 9"  (175.3 cm) Weight: 299 lb 3.2 oz (135.7 kg) IBW/kg (Calculated) : 66.2    Recent Labs  05/07/16 0518 05/08/16 0544 05/09/16 0629  NA  --   --  143  K 3.6 3.6 3.5  CL  --   --  106  CO2  --   --  32  GLUCOSE  --   --  171*  BUN  --   --  26*  CREATININE  --   --  0.79  CALCIUM  --   --  8.1*   Estimated Creatinine Clearance: 104 mL/min (by C-G formula based on SCr of 0.79 mg/dL).   Assessment: Pharmacy consulted to manage electrolytes in this 65 year old female admitted with NSTEMI and acute respiratory failure. S/p extubation.  Currently ordered furosemide 40mg  PO BID (increased from daily today) K 3.5  Plan:  Potassium WNL, continue MD order for KCl 40 PO daily. Will continue to follow.   Rocky Morel 05/09/2016,12:43 PM

## 2016-05-09 NOTE — Discharge Summary (Addendum)
[name], is a 65 y.o. female  DOB April 02, 1951  MRN [id number].  Admission date:  05/02/2016  Admitting Physician  Vilinda Boehringer, MD  Discharge Date:  05/09/2016   Primary MD  Cephus Richer, MD  Recommendations for primary care physician for things to follow:  Follow-up with Dr. Ola Spurr in 2 weeks  Admission Diagnosis  Acidemia [E87.2] Acute respiratory failure with hypoxia and hypercapnia ([place]) [J96.01, J96.02] Community acquired pneumonia, unspecified laterality [J18.9]   Discharge Diagnosis  Acidemia [E87.2] Acute respiratory failure with hypoxia and hypercapnia ([place]) [J96.01, J96.02] Community acquired pneumonia, unspecified laterality [J18.9]   Active Problems:   Acute respiratory failure with hypoxia and hypercapnia ([facility])   Pressure injury of skin      Past Medical History:  Diagnosis Date  . CHF (congestive heart failure) ([place])   . Diabetes mellitus without complication ([place])   . Hypertension     History reviewed. No pertinent surgical history.     History of present illness and  Hospital Course:     Kindly see H&P for history of present illness and admission details, please review complete Labs, Consult reports and Test reports for all details in brief  HPI  from the history and physical done on the day of admission  65 year old female patient admitted for shortness of breath, admitted to ICU for respiratory failure requiring emergent intubation in the emergency room. Patient has past medical history of morbid obesity, obstructive sleep apnea, obesity hypoventilation, hypertension, diabetes mellitus type 2, severe aortic stenosis. Patient was having respiratory distress, desaturating on CPAP machine at home, oxygen saturations are 70-80%. She has severe respiratory acidosis appears 7.16 PCO2  62 lactic acid 7.1 WBC 29.6 troponin 0.1 BNP 286 on admission.  Hospital Course  #1 acute respiratory failure: Intubated for acute respiratory failure with hypoxia: Chest x-ray showed multifocal pneumonia. Patient also had CT angio chest to rule out PE in the emergency room, the workup negative. Respiratory failure thought to be multifactorial because of pneumonia, sepsis, possible element of CHF. With flash pulmonary edema: 4 pneumonia patient received vancomycin, cefepime.   Patient with elevated pro calcitonin levels. Extubated on November 8, started on 4 L of nasal cannula, no respiratory distress. Symptoms improved with the antibiotics, nebulizers. Transferred  from ICU service to hospitalist service on November 8.  #2 non-ST elevation MI, critical troponin 55.29.; And by cardiology, started on full dose Lovenox, patient has severe aortic stenosis. Patient refuses surgery for severe aortic stenosis. She had 62 beat run of V. tach on November 8. Start her metoprolol succinate 25 mg daily, lisinopril. Not a candidate for cardiac intervention secondary to multiple medical problems.   #3 diabetes mellitus type 2: Seen by diabetes coordinator, started back on Lantus. Patient takes metformin at home.  Metformin was not given the hospital because of acute kidney injury., NovoLog 0-20 units 3 times a day with meals, NovoLog 0-5 units daily at bedtime, lowered her dose of Lantus at 10 units every 24 hours and it just accordingly at the nursing home.stopMetformin at discharge because of advanced age,   #4 obesity hypoventilation, patient uses BiPAP at bedtime. He to continue BiPAP at night at nursing home. Doesn't have CPAP or BiPAP at home, sleeping she needs a sleep studies as an outpatient.   Acute kidney injury secondary to sepsis: Improved with IV hydration.\   #6 group B strep bacteremia;, Escherichia coli UTI: seen by Dr. Ola Spurr, on high-dose Keflex. Patient has history of bilateral  lymphedema  of the legs, and her legs in wraps.. White count on admission 28, decreased to 7.6.    #7. deconditioning: Physical therapy recommended skilled recommended acute rehabilitation but patient said she cannot 2-3 hours of therapy every day so she chose to go to skilled nursing, most likely [facility] if accepted there.    Discharge Condition: Stable   Follow UP   follow-up with Dr. Ola Spurr in 2 weeks, Follow-up with Dr. Isaias Cowman in 2 weeks   Discharge Instructions  and  Discharge Medications        Medication List    STOP taking these medications   furosemide 40 MG tablet Commonly known as:  LASIX   metFORMIN 1000 MG tablet Commonly known as:  GLUCOPHAGE     TAKE these medications   amLODipine 5 MG tablet Commonly known as:  NORVASC Take 1 tablet by mouth daily.   atorvastatin 40 MG tablet Commonly known as:  LIPITOR Take 1 tablet (40 mg total) by mouth daily at 6 PM.   cephALEXin 500 MG capsule Commonly known as:  KEFLEX Take 2 capsules (1,000 mg total) by mouth 4 (four) times daily.   cloNIDine 0.1 MG tablet Commonly known as:  CATAPRES Take 1 tablet by mouth 2 (two) times daily.   docusate sodium 100 MG capsule Commonly known as:  COLACE Take 1 capsule (100 mg total) by mouth 2 (two) times daily.   fenofibrate 145 MG tablet Commonly known as:  TRICOR Take 1 tablet by mouth daily.   insulin aspart 100 UNIT/ML injection Commonly known as:  novoLOG Inject 0-20 Units into the skin 3 (three) times daily with meals.   insulin aspart 100 UNIT/ML injection Commonly known as:  novoLOG Inject 0-5 Units into the skin at bedtime.   LANTUS SOLOSTAR 100 UNIT/ML Solostar Pen Generic drug:  Insulin Glargine Inject 50-54 Units into the skin daily at 10 pm. Titrate for morning sugars less than 120   losartan 100 MG tablet Commonly known as:  COZAAR Take 1 tablet by mouth daily.   metoprolol succinate 25 MG 24 hr tablet Commonly  known as:  TOPROL-XL Take 1 tablet (25 mg total) by mouth daily. Start taking on:  05/10/2016   nystatin powder Commonly known as:  MYCOSTATIN/NYSTOP Apply topically 4 (four) times daily.         Diet and Activity recommendation: See Discharge Instructions above   Consults obtained - Cardiology, ID, pulmonary and critical care   Major procedures and Radiology Reports - PLEASE review detailed and final reports for all details, in brief -      Dg Abdomen 1 View  Result Date: 05/02/2016 CLINICAL DATA:  Hypoxia. EXAM: ABDOMEN - 1 VIEW COMPARISON:  No recent prior. FINDINGS: NG tube noted coiled stomach. Soft tissue structures are unremarkable air-filled loops of colon and possibly small bowel noted. Mild adynamic ileus cannot be excluded. No free air. Nodular bilateral pulmonary infiltrates noted, reference made to today's chest x-ray report. IMPRESSION: NG tube noted coiled stomach. Mild adynamic ileus cannot be excluded . Electronically Signed   By: Marcello Moores  Register   On: 05/02/2016 10:04   Ct Angio Chest Pe W And/or Wo Contrast  Result Date: 05/02/2016 CLINICAL DATA:  Hypoxia and tachycardia EXAM: CT ANGIOGRAPHY CHEST WITH CONTRAST TECHNIQUE: Multidetector CT imaging of the chest was performed using the standard protocol during bolus administration of intravenous contrast. Multiplanar CT image reconstructions and MIPs were obtained to evaluate the vascular anatomy. CONTRAST:  60 mL Isovue 370 nonionic  COMPARISON:  Chest radiograph May 02, 2016 FINDINGS: Cardiovascular: There is no demonstrable pulmonary embolus. The ascending thoracic aortic diameter is 4.3 x 4.2 cm. There is no thoracic aortic dissection. The visualized great vessels appear unremarkable. There is cardiomegaly. The pericardium is not appreciably thickened. There is rather minimal coronary artery calcification evident. Mediastinum/Nodes: Visualized thyroid appears unremarkable. There are scattered sub cm mediastinal  lymph nodes but no adenopathy evident. Endotracheal tube is present with the tip in the distal trachea. Nasogastric tube extends into the stomach. Lungs/Pleura: There is widespread interstitial and alveolar edema throughout the lungs. There is airspace consolidation in both lower lobes with small pleural effusions bilaterally. Upper Abdomen: Visualized upper abdominal structures appear unremarkable. Note that there is a degree of fatty infiltration in the pancreas. Musculoskeletal: There are no blastic or lytic bone lesions. Review of the MIP images confirms the above findings. IMPRESSION: No demonstrable pulmonary embolus. Ascending thoracic aorta measures 4.3 x 4.2 cm. Recommend annual imaging followup by CTA or MRA. This recommendation follows 2010 ACCF/AHA/AATS/ACR/ASA/SCA/SCAI/SIR/STS/SVM Guidelines for the Diagnosis and Management of Patients with Thoracic Aortic Disease. Circulation. 2010; 121: W237-S283. No thoracic aortic dissection. Extensive interstitial and alveolar edema with bibasilar consolidation and small pleural effusions. Suspect congestive heart failure, although superimposed pneumonia and/ or ARDS may be present. More than one of these entities may be present concurrently. Heart is enlarged. There is rather minimal coronary artery calcification evident. Patient is intubated. Nasogastric tube tip in stomach. No pneumothorax. Electronically Signed   By: Lowella Grip III M.D.   On: 05/02/2016 12:21   Dg Chest Port 1 View  Result Date: 05/03/2016 CLINICAL DATA:  Respiratory failure. EXAM: PORTABLE CHEST 1 VIEW COMPARISON:  05/02/2016 FINDINGS: Endotracheal tube tip measures 2.1 cm above the carina. Right central venous catheter tip over the cavoatrial junction. Enteric tube tip is off the field of view but below the left hemidiaphragm. Cardiac enlargement with mild vascular congestion. Improving perihilar infiltrates since previous study. No blunting of costophrenic angles. No pneumothorax.  IMPRESSION: Appliances appear in satisfactory position. Improving perihilar infiltrates since previous study. Electronically Signed   By: Lucienne Capers M.D.   On: 05/03/2016 06:29   Dg Chest Portable 1 View  Result Date: 05/02/2016 CLINICAL DATA:  Status post central line placement EXAM: PORTABLE CHEST 1 VIEW COMPARISON:  Portable chest X ray of 9:37 a.m. of today's date. FINDINGS: There has been interval placement of right internal jugular venous catheter. The tip of the catheter projects over the distal third of the SVC. The endotracheal tube tip lies 2.9 cm above the carina. The esophagogastric tube tip projects below the inferior margin of the image. There are fluffy alveolar opacities bilaterally which are more conspicuous today. The lungs are mildly hypoinflated. The cardiac silhouette is enlarged. The pulmonary vascularity is engorged. IMPRESSION: Right internal jugular venous catheter tip projecting over the distal SVC. No postprocedure complication following placement. Worsening alveolar opacities especially on the left consistent with increasing alveolar edema or pneumonia. Electronically Signed   By: David  Martinique M.D.   On: 05/02/2016 13:47   Dg Chest Port 1 View  Result Date: 05/02/2016 CLINICAL DATA:  Hypoxia EXAM: PORTABLE CHEST 1 VIEW COMPARISON:  None. FINDINGS: Endotracheal tube tip is 3.1 cm above the carina. Nasogastric tube tip and side port are below the diaphragm. No pneumothorax. There is widespread airspace consolidation throughout the right lung. There is moderate underlying interstitial edema. Heart is mildly enlarged with pulmonary vascularity within normal limits. No evident adenopathy. IMPRESSION: Tube  positions as described without pneumothorax. Evidence of underlying congestive heart failure. Probable multifocal pneumonia on the right superimposed on edema, although the opacity on the right could be due to alveolar edema in a somewhat atypical distribution. Both pneumonia  and alveolar edema may present concurrently. Electronically Signed   By: Lowella Grip III M.D.   On: 05/02/2016 10:08    Micro Results    Recent Results (from the past 240 hour(s))  Blood culture (routine x 2)     Status: Abnormal   Collection Time: 05/02/16  9:22 AM  Result Value Ref Range Status   Specimen Description BLOOD LEFT ASSIST CONTROL  Final   Special Requests BOTTLES DRAWN AEROBIC AND ANAEROBIC  6CC  Final   Culture  Setup Time   Final    GRAM POSITIVE COCCI IN BOTH AEROBIC AND ANAEROBIC BOTTLES CRITICAL RESULT CALLED TO, READ BACK BY AND VERIFIED WITH: JASON ROBBINS 05/02/16 @ 2125  [place]    Culture (A)  Final    GROUP B STREP(S.AGALACTIAE)ISOLATED Virtually 100% of S. agalactiae (Group B) strains are susceptible to Penicillin.  For Penicillin-allergic patients, Erythromycin (85-95% sensitive) and Clindamycin (80% sensitive) are drugs of choice. Contact microbiology lab to request sensitivities if  needed within 7 days. Performed at [facility]    Report Status 05/04/2016 FINAL  Final   Organism ID, Bacteria GROUP B STREP(S.AGALACTIAE)ISOLATED  Final      Susceptibility   Group b strep(s.agalactiae)isolated - MIC*    CLINDAMYCIN <=0.25 SENSITIVE Sensitive     AMPICILLIN <=0.25 SENSITIVE Sensitive     ERYTHROMYCIN <=0.12 SENSITIVE Sensitive     VANCOMYCIN 0.5 SENSITIVE Sensitive     CEFTRIAXONE <=0.12 SENSITIVE Sensitive     LEVOFLOXACIN 1 SENSITIVE Sensitive     * GROUP B STREP(S.AGALACTIAE)ISOLATED  Blood Culture ID Panel (Reflexed)     Status: Abnormal   Collection Time: 05/02/16  9:22 AM  Result Value Ref Range Status   Enterococcus species NOT DETECTED NOT DETECTED Final   Listeria monocytogenes NOT DETECTED NOT DETECTED Final   Staphylococcus species NOT DETECTED NOT DETECTED Final   Staphylococcus aureus NOT DETECTED NOT DETECTED Final   Streptococcus species DETECTED (A) NOT DETECTED Final    Comment: CRITICAL RESULT CALLED TO, READ BACK BY AND  VERIFIED WITH: JASON ROBBINS 05/02/16 @ 2125  [place]    Streptococcus agalactiae DETECTED (A) NOT DETECTED Final    Comment: CRITICAL RESULT CALLED TO, READ BACK BY AND VERIFIED WITH: JASON ROBBINS 05/02/16 @ 2125  [place]    Streptococcus pneumoniae NOT DETECTED NOT DETECTED Final   Streptococcus pyogenes NOT DETECTED NOT DETECTED Final   Acinetobacter baumannii NOT DETECTED NOT DETECTED Final   Enterobacteriaceae species NOT DETECTED NOT DETECTED Final   Enterobacter cloacae complex NOT DETECTED NOT DETECTED Final   Escherichia coli NOT DETECTED NOT DETECTED Final   Klebsiella oxytoca NOT DETECTED NOT DETECTED Final   Klebsiella pneumoniae NOT DETECTED NOT DETECTED Final   Proteus species NOT DETECTED NOT DETECTED Final   Serratia marcescens NOT DETECTED NOT DETECTED Final   Haemophilus influenzae NOT DETECTED NOT DETECTED Final   Neisseria meningitidis NOT DETECTED NOT DETECTED Final   Pseudomonas aeruginosa NOT DETECTED NOT DETECTED Final   Candida albicans NOT DETECTED NOT DETECTED Final   Candida glabrata NOT DETECTED NOT DETECTED Final   Candida krusei NOT DETECTED NOT DETECTED Final   Candida parapsilosis NOT DETECTED NOT DETECTED Final   Candida tropicalis NOT DETECTED NOT DETECTED Final  Urine culture  Status: Abnormal   Collection Time: 05/02/16  9:55 AM  Result Value Ref Range Status   Specimen Description URINE, RANDOM  Final   Special Requests NONE  Final   Culture >=100,000 COLONIES/mL ESCHERICHIA COLI (A)  Final   Report Status 05/04/2016 FINAL  Final   Organism ID, Bacteria ESCHERICHIA COLI (A)  Final      Susceptibility   Escherichia coli - MIC*    AMPICILLIN <=2 SENSITIVE Sensitive     CEFAZOLIN <=4 SENSITIVE Sensitive     CEFTRIAXONE <=1 SENSITIVE Sensitive     CIPROFLOXACIN <=0.25 SENSITIVE Sensitive     GENTAMICIN <=1 SENSITIVE Sensitive     IMIPENEM <=0.25 SENSITIVE Sensitive     NITROFURANTOIN <=16 SENSITIVE Sensitive     TRIMETH/SULFA <=20 SENSITIVE  Sensitive     AMPICILLIN/SULBACTAM <=2 SENSITIVE Sensitive     PIP/TAZO <=4 SENSITIVE Sensitive     Extended ESBL NEGATIVE Sensitive     * >=100,000 COLONIES/mL ESCHERICHIA COLI  MRSA PCR Screening     Status: None   Collection Time: 05/02/16 11:04 AM  Result Value Ref Range Status   MRSA by PCR NEGATIVE NEGATIVE Final    Comment:        The GeneXpert MRSA Assay (FDA approved for NASAL specimens only), is one component of a comprehensive MRSA colonization surveillance program. It is not intended to diagnose MRSA infection nor to guide or monitor treatment for MRSA infections.   Blood culture (routine x 2)     Status: None   Collection Time: 05/02/16  3:27 PM  Result Value Ref Range Status   Specimen Description BLOOD LEFT HAND  Final   Special Requests [facility] [place]  Final   Culture NO GROWTH 5 DAYS  Final   Report Status 05/07/2016 FINAL  Final       Today   Subjective:   [name] today has No complaints,stable to go to rehabilitation Objective:   Blood pressure (!) 116/58, pulse 92, temperature 98.2 F (36.8 C), temperature source Oral, resp. rate (!) 27, height 5\' 9"  (1.753 m), weight 135.7 kg (299 lb 3.2 oz), SpO2 94 %.   Intake/Output Summary (Last 24 hours) at 05/09/16 1250 Last data filed at 05/09/16 1100  Gross per 24 hour  Intake              600 ml  Output              400 ml  Net              200 ml    Exam Awake Alert, Oriented x 3, No new F.N deficits, Normal affect [place].AT,PERRAL Supple Neck,No JVD, No cervical lymphadenopathy appriciated.  Symmetrical Chest wall movement, Good air movement bilaterally, CTAB RRR,No Gallops,Rubs or new Murmurs, No Parasternal Heave +ve B.Sounds, Abd Soft, Non tender, No organomegaly appriciated, No rebound -guarding or rigidity. No Cyanosis, Clubbing or edema, No new Rash or bruise  Data Review   CBC w Diff:  Lab Results  Component Value Date   WBC 7.6 05/08/2016   HGB 9.8 (L) 05/08/2016   HCT  30.2 (L) 05/08/2016   PLT 258 05/08/2016   LYMPHOPCT 5 05/02/2016   MONOPCT 3 05/02/2016   EOSPCT 0 05/02/2016   BASOPCT 0 05/02/2016    CMP:  Lab Results  Component Value Date   NA 143 05/09/2016   K 3.5 05/09/2016   CL 106 05/09/2016   CO2 32 05/09/2016   BUN 26 (H) 05/09/2016  CREATININE 0.79 05/09/2016   PROT 6.4 (L) 05/02/2016   ALBUMIN 3.2 (L) 05/02/2016   BILITOT 0.8 05/02/2016   ALKPHOS 65 05/02/2016   AST 147 (H) 05/02/2016   ALT 27 05/02/2016  .   Total Time in preparing paper work, data evaluation and todays exam - 31 minutes  Fabien Travelstead M.D on 05/09/2016 at 12:50 PM    Note: This dictation was prepared with Dragon dictation along with smaller phrase technology. Any transcriptional errors that result from this process are unintentional.

## 2016-05-09 NOTE — Progress Notes (Signed)
Patient is medically stable for discharge to facility. Patient has been accepted [facility]. LCSW has sent over all clinical information for review.  Facility aware of DC. Patient made aware of bed and plans to transport by EMS. Patient to call family for DC. No other needs at this time.  Lane Hacker, MSW Clinical Social Work: [employer] Coverage for :  [phone]

## 2016-05-09 NOTE — Care Management Important Message (Signed)
Important Message  Patient Details  Name: [name] MRN: [id number] Date of Birth: 1951-03-26   Medicare Important Message Given:  Yes    Katrina Stack, RN 05/09/2016, 3:15 PM

## 2016-05-09 NOTE — Progress Notes (Signed)
Physical Therapy Treatment Patient Details Name: [name] MRN: [id number] DOB: January 16, 1951 Today's Date: 05/09/2016    History of Present Illness Pt is a 65 y/o F who presented in respiratory failure requiring emergent intubation in the ER.  Admitting dx: Acute sever hypoxic respiratory failure due to flash pulmonary edema in the setting of CHF/OSA, critical AS, and NSTEMI.  CTA chest negative for PE.  Pt was extubated on 11/8.  Pt's PMH includes CHF, morbid obesity.    PT Comments    Pt able to perform sit to/from stands during transfer training with CGA and min verbal cues for sequencing.  Pt required multiple attempts to successfully stand.  Pt able to take several small steps with RW and CGA with flexed trunk and wide BOS.  Pt reports feeling like her strength is getting better and is optimistic regarding return to PLOF.  Pt will benefit from continued PT services to address deficits in strength, gait, mobility, transfers, and activity tolerance for decreased caregiver assistance upon discharge.   Follow Up Recommendations  SNF     Equipment Recommendations       Recommendations for Other Services       Precautions / Restrictions Precautions Precautions: Fall Restrictions Weight Bearing Restrictions: No    Mobility  Bed Mobility                  Transfers Overall transfer level: Needs assistance Equipment used: Rolling walker (2 wheeled) Transfers: Sit to/from Stand Sit to Stand: Min guard         General transfer comment: Min verbal cues for positioning prior to stand and for sequencing during transfer  Ambulation/Gait Ambulation/Gait assistance: Min guard Ambulation Distance (Feet): 2 Feet Assistive device: Rolling walker (2 wheeled) Gait Pattern/deviations: Trunk flexed;Wide base of support;Decreased stride length   Gait velocity interpretation: Below normal speed for age/gender General Gait Details: Mod verbal cues for upright  posture   Stairs            Wheelchair Mobility    Modified Rankin (Stroke Patients Only)       Balance Overall balance assessment: Needs assistance         Standing balance support: Bilateral upper extremity supported Standing balance-Leahy Scale: Fair                      Cognition Arousal/Alertness: Awake/alert Behavior During Therapy: WFL for tasks assessed/performed Overall Cognitive Status: Within Functional Limits for tasks assessed                      Exercises Total Joint Exercises Ankle Circles/Pumps: Both;10 reps;15 reps Quad Sets: AROM;Both;10 reps Gluteal Sets: AROM;Both;10 reps Long Arc Quad: Strengthening;Both;10 reps;15 reps Knee Flexion: Strengthening;Both;10 reps;15 reps Other Exercises Other Exercises: Seated B hip flex 2 x 10 HEP education/review for B APs, QS, GS, and LAQs x 10-15 reps 5x/day   General Comments        Pertinent Vitals/Pain Pain Assessment: No/denies pain    Home Living                      Prior Function            PT Goals (current goals can now be found in the care plan section) Progress towards PT goals: Progressing toward goals    Frequency    Min 2X/week      PT Plan Current plan remains appropriate    Co-evaluation  End of Session Equipment Utilized During Treatment: Gait belt;Oxygen Activity Tolerance: Patient limited by fatigue Patient left: in chair;with chair alarm set;with call bell/phone within reach     Time: 4961-1643 PT Time Calculation (min) (ACUTE ONLY): 33 min  Charges:  $Therapeutic Exercise: 8-22 mins $Therapeutic Activity: 8-22 mins                    G Codes:      DRoyetta Asal PT, DPT 05/09/16, 1:45 PM

## 2016-05-09 NOTE — Clinical Social Work Placement (Signed)
   CLINICAL SOCIAL WORK PLACEMENT  NOTE  Date:  05/09/2016  Patient Details  Name: [name] MRN: [id number] Date of Birth: 01-Sep-1950  Clinical Social Work is seeking post-discharge placement for this patient at the [place] level of care (*CSW will initial, date and re-position this form in  chart as items are completed):  Yes   Patient/family provided with [place] Work Department's list of facilities offering this level of care within the geographic area requested by the patient (or if unable, by the patient's family).  Yes   Patient/family informed of their freedom to choose among providers that offer the needed level of care, that participate in Medicare, Medicaid or managed care program needed by the patient, have an available bed and are willing to accept the patient.  Yes   Patient/family informed of [facility]'s ownership interest in [facility] and [facility], as well as of the fact that they are under no obligation to receive care at these facilities.  PASRR submitted to EDS on       PASRR number received on       Existing PASRR number confirmed on 05/07/16     FL2 transmitted to all facilities in geographic area requested by pt/family on 05/07/16     FL2 transmitted to all facilities within larger geographic area on       Patient informed that his/her managed care company has contracts with or will negotiate with certain facilities, including the following:            Patient/family informed of bed offers received.  05/09/2016   Patient chooses bed at     [facility]  Physician recommends and patient chooses bed at      SNF Patient to be transferred to   on  .  05/09/2016   Patient to be transferred to facility by      EMS  Patient family notified on   of transfer.  Patient to notify  Name of family member notified:        PHYSICIAN       Additional Comment:     _______________________________________________ Lilly C[place], LCSW 05/09/2016, 1:31 PM

## 2016-07-13 ENCOUNTER — Inpatient Hospital Stay
Admission: EM | Admit: 2016-07-13 | Discharge: 2016-07-18 | DRG: 291 | Disposition: A | Discharge status: Skilled Nursing Facility | Payer: Medicare Other | Attending: Internal Medicine | Admitting: Internal Medicine

## 2016-07-13 ENCOUNTER — Encounter: Payer: Self-pay | Admitting: Emergency Medicine

## 2016-07-13 ENCOUNTER — Emergency Department: Payer: Medicare Other

## 2016-07-13 DIAGNOSIS — J9620 Acute and chronic respiratory failure, unspecified whether with hypoxia or hypercapnia: Secondary | ICD-10-CM | POA: Diagnosis present

## 2016-07-13 DIAGNOSIS — J09X2 Influenza due to identified novel influenza A virus with other respiratory manifestations: Secondary | ICD-10-CM | POA: Diagnosis present

## 2016-07-13 DIAGNOSIS — I350 Nonrheumatic aortic (valve) stenosis: Secondary | ICD-10-CM | POA: Diagnosis present

## 2016-07-13 DIAGNOSIS — Z6841 Body Mass Index (BMI) 40.0 and over, adult: Secondary | ICD-10-CM | POA: Diagnosis not present

## 2016-07-13 DIAGNOSIS — Z882 Allergy status to sulfonamides status: Secondary | ICD-10-CM

## 2016-07-13 DIAGNOSIS — Z79899 Other long term (current) drug therapy: Secondary | ICD-10-CM

## 2016-07-13 DIAGNOSIS — E1169 Type 2 diabetes mellitus with other specified complication: Secondary | ICD-10-CM | POA: Diagnosis present

## 2016-07-13 DIAGNOSIS — Z7982 Long term (current) use of aspirin: Secondary | ICD-10-CM

## 2016-07-13 DIAGNOSIS — B951 Streptococcus, group B, as the cause of diseases classified elsewhere: Secondary | ICD-10-CM | POA: Diagnosis present

## 2016-07-13 DIAGNOSIS — I5031 Acute diastolic (congestive) heart failure: Secondary | ICD-10-CM | POA: Diagnosis present

## 2016-07-13 DIAGNOSIS — I2510 Atherosclerotic heart disease of native coronary artery without angina pectoris: Secondary | ICD-10-CM | POA: Diagnosis present

## 2016-07-13 DIAGNOSIS — I110 Hypertensive heart disease with heart failure: Principal | ICD-10-CM | POA: Diagnosis present

## 2016-07-13 DIAGNOSIS — E785 Hyperlipidemia, unspecified: Secondary | ICD-10-CM | POA: Diagnosis present

## 2016-07-13 DIAGNOSIS — G4733 Obstructive sleep apnea (adult) (pediatric): Secondary | ICD-10-CM | POA: Diagnosis present

## 2016-07-13 DIAGNOSIS — I509 Heart failure, unspecified: Secondary | ICD-10-CM

## 2016-07-13 DIAGNOSIS — J441 Chronic obstructive pulmonary disease with (acute) exacerbation: Secondary | ICD-10-CM | POA: Diagnosis present

## 2016-07-13 DIAGNOSIS — M6281 Muscle weakness (generalized): Secondary | ICD-10-CM

## 2016-07-13 DIAGNOSIS — R0602 Shortness of breath: Secondary | ICD-10-CM | POA: Diagnosis not present

## 2016-07-13 DIAGNOSIS — Z794 Long term (current) use of insulin: Secondary | ICD-10-CM | POA: Diagnosis not present

## 2016-07-13 DIAGNOSIS — Z888 Allergy status to other drugs, medicaments and biological substances status: Secondary | ICD-10-CM | POA: Diagnosis not present

## 2016-07-13 DIAGNOSIS — R262 Difficulty in walking, not elsewhere classified: Secondary | ICD-10-CM

## 2016-07-13 HISTORY — DX: Obstructive sleep apnea (adult) (pediatric): G47.33

## 2016-07-13 HISTORY — DX: Unspecified diastolic (congestive) heart failure: I50.30

## 2016-07-13 HISTORY — DX: Hyperlipidemia, unspecified: E78.5

## 2016-07-13 HISTORY — DX: Nonrheumatic aortic (valve) stenosis: I35.0

## 2016-07-13 HISTORY — DX: Atherosclerotic heart disease of native coronary artery without angina pectoris: I25.10

## 2016-07-13 LAB — COMPREHENSIVE METABOLIC PANEL
ALBUMIN: 3.8 g/dL (ref 3.5–5.0)
ALK PHOS: 55 U/L (ref 38–126)
ALT: 17 U/L (ref 14–54)
ANION GAP: 10 (ref 5–15)
AST: 29 U/L (ref 15–41)
BILIRUBIN TOTAL: 1 mg/dL (ref 0.3–1.2)
BUN: 26 mg/dL — AB (ref 6–20)
CO2: 21 mmol/L — ABNORMAL LOW (ref 22–32)
Calcium: 8.5 mg/dL — ABNORMAL LOW (ref 8.9–10.3)
Chloride: 106 mmol/L (ref 101–111)
Creatinine, Ser: 1.15 mg/dL — ABNORMAL HIGH (ref 0.44–1.00)
GFR calc Af Amer: 57 mL/min — ABNORMAL LOW (ref >60)
GFR calc non Af Amer: 49 mL/min — ABNORMAL LOW (ref >60)
GLUCOSE: 183 mg/dL — AB (ref 65–99)
POTASSIUM: 3.9 mmol/L (ref 3.5–5.1)
Sodium: 137 mmol/L (ref 135–145)
TOTAL PROTEIN: 7.2 g/dL (ref 6.5–8.1)

## 2016-07-13 LAB — CBC WITH DIFFERENTIAL/PLATELET
Basophils Absolute: 0 10*3/uL (ref 0–0.1)
Basophils Relative: 0 %
EOS PCT: 0 %
Eosinophils Absolute: 0 10*3/uL (ref 0–0.7)
HCT: 35.4 % (ref 35.0–47.0)
Hemoglobin: 11.5 g/dL — ABNORMAL LOW (ref 12.0–16.0)
LYMPHS ABS: 0.3 10*3/uL — AB (ref 1.0–3.6)
LYMPHS PCT: 4 %
MCH: 26.1 pg (ref 26.0–34.0)
MCHC: 32.4 g/dL (ref 32.0–36.0)
MCV: 80.6 fL (ref 80.0–100.0)
MONO ABS: 0.4 10*3/uL (ref 0.2–0.9)
Monocytes Relative: 5 %
Neutro Abs: 6.8 10*3/uL — ABNORMAL HIGH (ref 1.4–6.5)
Neutrophils Relative %: 91 %
PLATELETS: 204 10*3/uL (ref 150–440)
RBC: 4.4 MIL/uL (ref 3.80–5.20)
RDW: 17.2 % — AB (ref 11.5–14.5)
WBC: 7.5 10*3/uL (ref 3.6–11.0)

## 2016-07-13 LAB — TROPONIN I: TROPONIN I: 0.12 ng/mL — AB (ref <0.03)

## 2016-07-13 LAB — BRAIN NATRIURETIC PEPTIDE: B Natriuretic Peptide: 1086 pg/mL — ABNORMAL HIGH (ref 0.0–100.0)

## 2016-07-13 MED ORDER — ASPIRIN EC 81 MG PO TBEC
81.0000 mg | DELAYED_RELEASE_TABLET | Freq: Every day | ORAL | Status: DC
  Administered 2016-07-14 – 2016-07-18 (×5): 81 mg via ORAL
  Filled 2016-07-13 (×5): qty 1

## 2016-07-13 MED ORDER — FUROSEMIDE 10 MG/ML IJ SOLN
40.0000 mg | Freq: Two times a day (BID) | INTRAMUSCULAR | Status: DC
  Administered 2016-07-14 – 2016-07-18 (×9): 40 mg via INTRAVENOUS
  Filled 2016-07-13 (×9): qty 4

## 2016-07-13 MED ORDER — NITROGLYCERIN 2 % TD OINT
1.0000 [in_us] | TOPICAL_OINTMENT | Freq: Once | TRANSDERMAL | Status: AC
  Administered 2016-07-13: 1 [in_us] via TOPICAL
  Filled 2016-07-13: qty 1

## 2016-07-13 MED ORDER — IPRATROPIUM-ALBUTEROL 0.5-2.5 (3) MG/3ML IN SOLN
3.0000 mL | Freq: Four times a day (QID) | RESPIRATORY_TRACT | Status: DC
  Administered 2016-07-14 – 2016-07-18 (×20): 3 mL via RESPIRATORY_TRACT
  Filled 2016-07-13 (×20): qty 3

## 2016-07-13 MED ORDER — FENOFIBRATE 54 MG PO TABS
54.0000 mg | ORAL_TABLET | Freq: Every day | ORAL | Status: DC
  Administered 2016-07-14 – 2016-07-18 (×5): 54 mg via ORAL
  Filled 2016-07-13 (×5): qty 1

## 2016-07-13 MED ORDER — NYSTATIN 100000 UNIT/GM EX POWD
Freq: Four times a day (QID) | CUTANEOUS | Status: DC
  Administered 2016-07-14 – 2016-07-18 (×14): via TOPICAL
  Filled 2016-07-13: qty 15

## 2016-07-13 MED ORDER — LOSARTAN POTASSIUM 50 MG PO TABS
100.0000 mg | ORAL_TABLET | Freq: Every day | ORAL | Status: DC
Start: 2016-07-14 — End: 2016-07-19
  Administered 2016-07-14 – 2016-07-16 (×3): 100 mg via ORAL
  Filled 2016-07-13 (×4): qty 2

## 2016-07-13 MED ORDER — CEPHALEXIN 250 MG PO CAPS
500.0000 mg | ORAL_CAPSULE | Freq: Two times a day (BID) | ORAL | Status: DC
  Administered 2016-07-13 – 2016-07-18 (×10): 500 mg via ORAL
  Filled 2016-07-13: qty 1
  Filled 2016-07-13: qty 2
  Filled 2016-07-13 (×2): qty 1
  Filled 2016-07-13: qty 2
  Filled 2016-07-13 (×5): qty 1
  Filled 2016-07-13: qty 2

## 2016-07-13 MED ORDER — INSULIN GLARGINE 100 UNIT/ML ~~LOC~~ SOLN
50.0000 [IU] | Freq: Every day | SUBCUTANEOUS | Status: DC
  Administered 2016-07-14 – 2016-07-18 (×5): 50 [IU] via SUBCUTANEOUS
  Filled 2016-07-13 (×6): qty 0.5

## 2016-07-13 MED ORDER — ATORVASTATIN CALCIUM 20 MG PO TABS
40.0000 mg | ORAL_TABLET | Freq: Every day | ORAL | Status: DC
  Administered 2016-07-14 – 2016-07-17 (×4): 40 mg via ORAL
  Filled 2016-07-13 (×4): qty 2

## 2016-07-13 MED ORDER — METOPROLOL SUCCINATE ER 25 MG PO TB24
25.0000 mg | ORAL_TABLET | Freq: Every day | ORAL | Status: DC
  Administered 2016-07-14 – 2016-07-16 (×3): 25 mg via ORAL
  Filled 2016-07-13 (×4): qty 1

## 2016-07-13 MED ORDER — LEVOFLOXACIN IN D5W 500 MG/100ML IV SOLN
500.0000 mg | INTRAVENOUS | Status: DC
  Administered 2016-07-13: 500 mg via INTRAVENOUS
  Filled 2016-07-13 (×2): qty 100

## 2016-07-13 MED ORDER — FUROSEMIDE 10 MG/ML IJ SOLN
40.0000 mg | Freq: Once | INTRAMUSCULAR | Status: AC
  Administered 2016-07-13: 40 mg via INTRAVENOUS
  Filled 2016-07-13: qty 4

## 2016-07-13 MED ORDER — CLONIDINE HCL 0.1 MG PO TABS
0.1000 mg | ORAL_TABLET | Freq: Two times a day (BID) | ORAL | Status: DC
  Administered 2016-07-13 – 2016-07-17 (×7): 0.1 mg via ORAL
  Filled 2016-07-13 (×9): qty 1

## 2016-07-13 MED ORDER — DOCUSATE SODIUM 100 MG PO CAPS
100.0000 mg | ORAL_CAPSULE | Freq: Two times a day (BID) | ORAL | Status: DC
  Administered 2016-07-13 – 2016-07-18 (×10): 100 mg via ORAL
  Filled 2016-07-13 (×10): qty 1

## 2016-07-13 MED ORDER — AMLODIPINE BESYLATE 5 MG PO TABS
5.0000 mg | ORAL_TABLET | Freq: Every day | ORAL | Status: DC
  Administered 2016-07-14 – 2016-07-16 (×3): 5 mg via ORAL
  Filled 2016-07-13 (×4): qty 1

## 2016-07-13 NOTE — ED Triage Notes (Signed)
Pt in via EMS from home with complaints of cough, congestion since Tuesday with increasing shortness of breath today.  EMS reports room air sat 90%, pt given one duoneb en route.  Pt presents with labored breathing, tachypneic, A/Ox4.

## 2016-07-13 NOTE — H&P (Addendum)
[place] [facility] [place] NAME: [name]    MR#:  [id number]  DATE OF BIRTH:  02-09-1951  DATE OF ADMISSION:  07/13/2016  PRIMARY CARE PHYSICIAN: Frazier Richards, MD   REQUESTING/REFERRING PHYSICIAN:   CHIEF COMPLAINT:   Chief Complaint  Patient presents with  . Shortness of Breath    HISTORY OF PRESENT ILLNESS: [name]  is a 66 y.o. female with a known history of Coronary artery disease, severe aortic stenosis, diastolic congestive heart failure, diabetes type 2, essential hypertension, hyperlipidemia unspecified who is presenting to the hospital with complaining of shortness of breath since Thursday. She reports that she has had progressive worsening of her breathing. She is noted to have wheezing and dry cough. She has not had any fevers or chills. Denies any chest pain or palpitations. PAST MEDICAL HISTORY:   Past Medical History:  Diagnosis Date  . CAD (coronary artery disease)   . CHF (congestive heart failure) ([place])   . Diabetes mellitus without complication ([place])   . Diastolic CHF ([place])   . Hyperlipemia   . Hypertension   . Hypertension   . OSA (obstructive sleep apnea)   . Severe aortic stenosis     PAST SURGICAL HISTORY: History reviewed. No pertinent surgical history.  SOCIAL HISTORY:  Social History  Substance Use Topics  . Smoking status: Never Smoker  . Smokeless tobacco: Never Used  . Alcohol use No    FAMILY HISTORY:  Family History  Problem Relation Age of Onset  . Cancer Mother   . Heart failure Father     DRUG ALLERGIES:  Allergies  Allergen Reactions  . Tetanus Toxoids Swelling  . Lisinopril Rash  . Niacin Rash  . Sitagliptin Rash  . Sulfamethoxazole-Trimethoprim Rash    REVIEW OF SYSTEMS:   CONSTITUTIONAL: No fever,Positive fatigue and weakness.  EYES: No blurred or double vision.  EARS, NOSE, AND THROAT: No tinnitus or ear pain.  RESPIRATORY: Positive cough, positive shortness of breath,  positive wheezing or hemoptysis.  CARDIOVASCULAR: No chest pain, orthopnea, edema.  GASTROINTESTINAL: No nausea, vomiting, diarrhea or abdominal pain.  GENITOURINARY: No dysuria, hematuria.  ENDOCRINE: No polyuria, nocturia,  HEMATOLOGY: No anemia, easy bruising or bleeding SKIN: No rash or lesion. MUSCULOSKELETAL: No joint pain or arthritis.   NEUROLOGIC: No tingling, numbness, weakness.  PSYCHIATRY: No anxiety or depression.   MEDICATIONS AT HOME:  Prior to Admission medications   Medication Sig Start Date End Date Taking? Authorizing Provider  amLODipine (NORVASC) 5 MG tablet Take 1 tablet by mouth daily. 04/17/16  Yes Historical Provider, MD  aspirin EC 81 MG tablet Take 81 mg by mouth daily.   Yes Historical Provider, MD  atorvastatin (LIPITOR) 40 MG tablet Take 1 tablet (40 mg total) by mouth daily at 6 PM. 05/09/16  Yes Epifanio Lesches, MD  cephALEXin (KEFLEX) 500 MG capsule Take 500 mg by mouth 2 (two) times daily. 06/23/16  Yes Historical Provider, MD  cloNIDine (CATAPRES) 0.1 MG tablet Take 1 tablet by mouth 2 (two) times daily. 04/17/16  Yes Historical Provider, MD  docusate sodium (COLACE) 100 MG capsule Take 1 capsule (100 mg total) by mouth 2 (two) times daily. 05/09/16  Yes Epifanio Lesches, MD  fenofibrate (TRICOR) 145 MG tablet Take 1 tablet by mouth daily. 04/17/16  Yes Historical Provider, MD  furosemide (LASIX) 40 MG tablet Take 1 tablet (40 mg total) by mouth 2 (two) times daily. Patient taking differently: Take 40 mg by mouth  daily.  05/09/16  Yes Epifanio Lesches, MD  LANTUS SOLOSTAR 100 UNIT/ML Solostar Pen Inject 50 Units into the skin daily. Titrate for morning sugars less than 120 04/11/16  Yes Historical Provider, MD  losartan (COZAAR) 100 MG tablet Take 1 tablet by mouth daily. 04/28/16  Yes Historical Provider, MD  metoprolol succinate (TOPROL-XL) 25 MG 24 hr tablet Take 1 tablet (25 mg total) by mouth daily. 05/10/16  Yes Epifanio Lesches, MD   nystatin (MYCOSTATIN/NYSTOP) powder Apply topically 4 (four) times daily.  04/18/16  Yes Historical Provider, MD  insulin aspart (NOVOLOG) 100 UNIT/ML injection Inject 0-20 Units into the skin 3 (three) times daily with meals. Patient not taking: Reported on 07/13/2016 05/09/16   Epifanio Lesches, MD  insulin aspart (NOVOLOG) 100 UNIT/ML injection Inject 0-5 Units into the skin at bedtime. Patient not taking: Reported on 07/13/2016 05/09/16   Epifanio Lesches, MD      PHYSICAL EXAMINATION:   VITAL SIGNS: Blood pressure 133/79, pulse (!) 102, resp. rate (!) 26, height 5\' 5"  (1.651 m), weight (!) 310 lb (140.6 kg), SpO2 96 %.  GENERAL:  66 y.o.-year-old patient lying in the bed with no acute distress.  EYES: Pupils equal, round, reactive to light and accommodation. No scleral icterus. Extraocular muscles intact.  HEENT: Head atraumatic, normocephalic. Oropharynx and nasopharynx clear.  NECK:  Supple, no jugular venous distention. No thyroid enlargement, no tenderness.  LUNGS: Normal breath sounds bilaterally, no wheezing, rales,rhonchi or crepitation. No use of accessory muscles of respiration.  CARDIOVASCULAR: S1, S2 normal. Positive systolic murmur , occasional wheezing and crackles over both lung ABDOMEN: Soft, nontender, nondistended. Bowel sounds present. No organomegaly or mass.  EXTREMITIES: Unna boots in place NEUROLOGIC: Cranial nerves II through XII are intact. Muscle strength 5/5 in all extremities. Sensation intact. Gait not checked.  PSYCHIATRIC: The patient is alert and oriented x 3.  SKIN: No obvious rash, lesion, or ulcer.   LABORATORY PANEL:   CBC  Recent Labs Lab 07/13/16 1836  WBC 7.5  HGB 11.5*  HCT 35.4  PLT 204  MCV 80.6  MCH 26.1  MCHC 32.4  RDW 17.2*  LYMPHSABS 0.3*  MONOABS 0.4  EOSABS 0.0  BASOSABS 0.0   ------------------------------------------------------------------------------------------------------------------  Chemistries   Recent  Labs Lab 07/13/16 1836  NA 137  K 3.9  CL 106  CO2 21*  GLUCOSE 183*  BUN 26*  CREATININE 1.15*  CALCIUM 8.5*  AST 29  ALT 17  ALKPHOS 55  BILITOT 1.0   ------------------------------------------------------------------------------------------------------------------ estimated creatinine clearance is 69.6 mL/min (by C-G formula based on SCr of 1.15 mg/dL (H)). ------------------------------------------------------------------------------------------------------------------ No results for input(s): TSH, T4TOTAL, T3FREE, THYROIDAB in the last 72 hours.  Invalid input(s): FREET3   Coagulation profile No results for input(s): INR, PROTIME in the last 168 hours. ------------------------------------------------------------------------------------------------------------------- No results for input(s): DDIMER in the last 72 hours. -------------------------------------------------------------------------------------------------------------------  Cardiac Enzymes  Recent Labs Lab 07/13/16 1836  TROPONINI 0.12*   ------------------------------------------------------------------------------------------------------------------ Invalid input(s): POCBNP  ---------------------------------------------------------------------------------------------------------------  Urinalysis    Component Value Date/Time   COLORURINE YELLOW (A) 05/02/2016 0955   APPEARANCEUR CLOUDY (A) 05/02/2016 0955   LABSPEC 1.011 05/02/2016 0955   PHURINE 5.0 05/02/2016 0955   GLUCOSEU 150 (A) 05/02/2016 0955   HGBUR 2+ (A) 05/02/2016 0955   BILIRUBINUR NEGATIVE 05/02/2016 0955   KETONESUR NEGATIVE 05/02/2016 0955   PROTEINUR 100 (A) 05/02/2016 0955   NITRITE NEGATIVE 05/02/2016 0955   LEUKOCYTESUR 2+ (A) 05/02/2016 0955     RADIOLOGY: Dg Chest 2 View  Result Date: 07/13/2016 CLINICAL DATA:  Cough and congestion EXAM: CHEST  2 VIEW COMPARISON:  05/03/2016 FINDINGS: Low lung volumes. There is  cardiomegaly with central vascular congestion and mild perihilar edema. Subsegmental atelectasis in the bilateral upper lobes. No large effusion. Cannot exclude mild infiltrate at the left base. IMPRESSION: 1. Cardiomegaly with central vascular congestion and mild perihilar edema. 2. Discoid atelectasis in the upper lobes. Cannot exclude a mild infiltrate at the left lung base Electronically Signed   By: Donavan Foil M.D.   On: 07/13/2016 18:57    EKG: Orders placed or performed during the hospital encounter of 07/13/16  . ED EKG  . ED EKG  . EKG 12-Lead  . EKG 12-Lead    IMPRESSION AND PLAN: Patient is a 66 year old with known history of severe aortic stenosis presents with shortness of breath  1. Acute respiratory failure Suspect multifactorial including acute diastolic CHF, pneumonia associated with bronchospasm I will treat with IV Lasix We'll place her on nebulizer therapy,  IV Levaquin for possible pneumonia  2. Diabetes type 2 Continued therapy with Lantus place on sliding scale  3. Essential hypertension continue amlodipine and clonidine and Cozaar  4. Hyperlipidemia unspecified continue therapy with atorvastatin TriCor  5. Group B Streptococcus bacteremia as per ID she is on Keflex which will continue  6. Coronary artery disease with elevated troponin we'll cycle enzymes I will place her on aspirin cardiology consult has been ordered HFP and elevated troponin  7. CODE STATUS patient was previously DO NOT RESUSCITATE she reports that she is not sure how that DO NOT RESUSCITATE came about she has not discussed this with anybody and wouldn't want a full code  All the records are reviewed and case discussed with ED provider. Management plans discussed with the patient, family and they are in agreement.  CODE STATUS:    Code Status Orders        Start     Ordered   07/13/16 1918  Full code  Continuous     07/13/16 1917    Code Status History    Date Active Date  Inactive Code Status Order ID Comments User Context   05/02/2016 12:21 PM 05/09/2016  9:23 PM DNR [id number]  Vilinda Boehringer, MD ED       TOTAL TIME TAKING CARE OF THIS PATIENT: 55 minutes.    Dustin Flock M.D on 07/13/2016 at 7:20 PM  Between 7am to 6pm - Pager - [phone]  After 6pm go to [email or web address] - password [email or web address] [facility]  Office  [phone]  CC: Primary care physician; Frazier Richards, MD

## 2016-07-13 NOTE — Progress Notes (Signed)
Advanced care plan.  Purpose of the Encounter: CODE STATUS  Parties in Attendance: [name] patient her self  Patient's Decision Capacity: intact  Subjective/Patient's story: Patient is a 66 year old white female with history of severe aortic stenosis, CHF presenting to the ED with worsening shortness of breath. Patient has a DO NOT RESUSCITATE status in place from last year.    Objective/Medical story DO NOT RESUSCITATE status readdressed to confirm patient states that she wants to be a full code she wants everything done to save her life.   Goals of care determination: DO NOT RESUSCITATE status reversed she is a full code now    CODE STATUS: Full code   Time spent discussing advanced care planning: 15 minutes

## 2016-07-13 NOTE — ED Provider Notes (Signed)
Time Seen: Approximately 1809  I have reviewed the triage notes  Chief Complaint: Shortness of Breath   History of Present Illness: [name] is a 66 y.o. female *who has a history of multiple medical problems and describes some cough, congestion that started on Tuesday of this week with increasing shortness of breath. Patient's not aware of any fever at home and states her cough is overall been dry and nonproductive. She was transported here by EMS with a reported a saturation of 90% on room air and she received a DuoNeb in transport. Patient was recently hospitalized here for acute myocardial infarction and congestive heart failure. She's also had a history community-acquired pneumonia and respiratory failure with COPD etc.   Past Medical History:  Diagnosis Date  . CAD (coronary artery disease)   . CHF (congestive heart failure) ([place])   . Diabetes mellitus without complication ([place])   . Diastolic CHF ([place])   . Hyperlipemia   . Hypertension   . Hypertension   . OSA (obstructive sleep apnea)   . Severe aortic stenosis     Patient Active Problem List   Diagnosis Date Noted  . Acute CHF (congestive heart failure) ([place]) 07/13/2016  . Acute respiratory failure with hypoxia and hypercapnia ([place]) 05/02/2016  . Pressure injury of skin 05/02/2016  . Lactic acidosis   . Severe aortic stenosis   . OSA on CPAP   . Morbid obesity ([place])   . Community acquired pneumonia     History reviewed. No pertinent surgical history.  History reviewed. No pertinent surgical history.  Current Outpatient Rx  . Order #: [id number] Class: Historical Med  . Order #: [id number] Class: Historical Med  . Order #: [id number] Class: Normal  . Order #: [id number] Class: Historical Med  . Order #: [id number] Class: Historical Med  . Order #: [id number] Class: Normal  . Order #: [id number] Class: Historical Med  . Order #: [id number] Class: Normal  . Order #: [id number] Class: Historical Med  . Order #:  [id number] Class: Historical Med  . Order #: [id number] Class: Normal  . Order #: [id number] Class: Historical Med  . Order #: [id number] Class: Normal  . Order #: [id number] Class: Normal    Allergies:  Tetanus toxoids; Lisinopril; Niacin; Sitagliptin; and Sulfamethoxazole-trimethoprim  Family History: Family History  Problem Relation Age of Onset  . Cancer Mother   . Heart failure Father     Social History: Social History  Substance Use Topics  . Smoking status: Never Smoker  . Smokeless tobacco: Never Used  . Alcohol use No     Review of Systems:   10 point review of systems was performed and was otherwise negative:  Constitutional: No fever Eyes: No visual disturbances ENT: No sore throat, ear pain Cardiac: She describes some mild left-sided chest discomfort. None current Respiratory: Gradually progressive shortness of breath special with any form of physical exertion Abdomen: No abdominal pain, no vomiting, No diarrhea Endocrine: No weight loss, No night sweats Extremities: She has chronic peripheral edema and states she has noticed an increase in swelling in both legs and has had a weight gain over the last several days Skin: No rashes, easy bruising Neurologic: No focal weakness, trouble with speech or swollowing Urologic: No dysuria, Hematuria, or urinary frequency   Physical Exam:  ED Triage Vitals  Enc Vitals Group     BP 07/13/16 1915 133/79     Pulse Rate 07/13/16 1915 (!) 102     Resp 07/13/16 1915 (!) 26     Temp --  Temp src --      SpO2 07/13/16 1806 90 %     Weight 07/13/16 1811 (!) 310 lb (140.6 kg)     Height 07/13/16 1811 5\' 5"  (1.651 m)     Head Circumference --      [place] Flow --      Pain Score --      Pain Loc --      Pain Edu? --      Excl. in [place]? --     General: Awake , Alert , and Oriented times 3; GCS 15 Tachypneic and speaks in interrupted sentences Head: Normal cephalic , atraumatic Eyes: Pupils equal , round, reactive to  light Nose/Throat: No nasal drainage, patent upper airway without erythema or exudate.  Neck: Supple, Full range of motion, No anterior adenopathy or palpable thyroid masses Lungs: Diminished breath sounds bilaterally at the bases Heart: Regular rate, irregular rhythm with grade 3 ejection murmur left sternal border murmurs , gallops , or rubs Abdomen: Morbidly obese Soft, non tender without rebound, guarding , or rigidity; bowel sounds positive and symmetric in all 4 quadrants. No organomegaly .        Extremities: 2 plus symmetric pulses. Bilateral peripheral edema with wrapping  Neurologic: normal ambulation, Motor symmetric without deficits, sensory intact Skin: warm, dry, no rashes   Labs:   All laboratory work was reviewed including any pertinent negatives or positives listed below:  Labs Reviewed  CBC WITH DIFFERENTIAL/PLATELET - Abnormal; Notable for the following:       Result Value   Hemoglobin 11.5 (*)    RDW 17.2 (*)    Neutro Abs 6.8 (*)    Lymphs Abs 0.3 (*)    All other components within normal limits  COMPREHENSIVE METABOLIC PANEL - Abnormal; Notable for the following:    CO2 21 (*)    Glucose, Bld 183 (*)    BUN 26 (*)    Creatinine, Ser 1.15 (*)    Calcium 8.5 (*)    GFR calc non Af Amer 49 (*)    GFR calc Af Amer 57 (*)    All other components within normal limits  TROPONIN I - Abnormal; Notable for the following:    Troponin I 0.12 (*)    All other components within normal limits  CULTURE, EXPECTORATED SPUTUM-ASSESSMENT  CULTURE, BLOOD (ROUTINE X 2)  CULTURE, BLOOD (ROUTINE X 2)  BRAIN NATRIURETIC PEPTIDE    EKG: ED ECG REPORT I, Daymon Larsen, the attending physician, personally viewed and interpreted this ECG.  Date: 07/13/2016 EKG Time: 1913 Rate: 102 Rhythm: Sinus tachycardia with occasional PACs  QRS Axis: normal Intervals: normal ST/T Wave abnormalities: normal Conduction Disturbances: none Narrative Interpretation: unremarkable QRS  complexes noticed in the inferior leads Poor R-wave progression in septal and anterior leads No obvious acute ischemic changes   Radiology:  "Dg Chest 2 View  Result Date: 07/13/2016 CLINICAL DATA:  Cough and congestion EXAM: CHEST  2 VIEW COMPARISON:  05/03/2016 FINDINGS: Low lung volumes. There is cardiomegaly with central vascular congestion and mild perihilar edema. Subsegmental atelectasis in the bilateral upper lobes. No large effusion. Cannot exclude mild infiltrate at the left base. IMPRESSION: 1. Cardiomegaly with central vascular congestion and mild perihilar edema. 2. Discoid atelectasis in the upper lobes. Cannot exclude a mild infiltrate at the left lung base Electronically Signed   By: Donavan Foil M.D.   On: 07/13/2016 18:57  "  I personally reviewed the radiologic studies   P ED  Course:  Patient arrives in some mild respiratory distress speaking in interrupted sentences and is tachypneic. Some hypoxia on room air was placed on supplemental oxygen therapy. She has some coarse breath sounds auscultated bilaterally with sounds of [facility]. Patient was started on treatment directed towards pulmonary edema  patient had IV Lasix along with an inch of nitroglycerin paste.   Assessment Acute respiratory distress  Final Clinical Impression: * Final diagnoses:  SOB (shortness of breath)     Plan:  Inpatient            Daymon Larsen, MD 07/13/16 1926

## 2016-07-14 ENCOUNTER — Encounter: Payer: Self-pay | Admitting: Emergency Medicine

## 2016-07-14 LAB — PROCALCITONIN: Procalcitonin: 0.19 ng/mL

## 2016-07-14 LAB — GLUCOSE, CAPILLARY
GLUCOSE-CAPILLARY: 207 mg/dL — AB (ref 65–99)
Glucose-Capillary: 158 mg/dL — ABNORMAL HIGH (ref 65–99)
Glucose-Capillary: 183 mg/dL — ABNORMAL HIGH (ref 65–99)

## 2016-07-14 LAB — EXPECTORATED SPUTUM ASSESSMENT W REFEX TO RESP CULTURE: SPECIAL REQUESTS: NORMAL

## 2016-07-14 LAB — CBC
HEMATOCRIT: 32.8 % — AB (ref 35.0–47.0)
Hemoglobin: 10.7 g/dL — ABNORMAL LOW (ref 12.0–16.0)
MCH: 26.5 pg (ref 26.0–34.0)
MCHC: 32.7 g/dL (ref 32.0–36.0)
MCV: 81.1 fL (ref 80.0–100.0)
PLATELETS: 169 10*3/uL (ref 150–440)
RBC: 4.04 MIL/uL (ref 3.80–5.20)
RDW: 16.9 % — AB (ref 11.5–14.5)
WBC: 5.9 10*3/uL (ref 3.6–11.0)

## 2016-07-14 LAB — BASIC METABOLIC PANEL WITH GFR
Anion gap: 9 (ref 5–15)
BUN: 29 mg/dL — ABNORMAL HIGH (ref 6–20)
CO2: 24 mmol/L (ref 22–32)
Calcium: 8 mg/dL — ABNORMAL LOW (ref 8.9–10.3)
Chloride: 105 mmol/L (ref 101–111)
Creatinine, Ser: 1.21 mg/dL — ABNORMAL HIGH (ref 0.44–1.00)
GFR calc Af Amer: 53 mL/min — ABNORMAL LOW
GFR calc non Af Amer: 46 mL/min — ABNORMAL LOW
Glucose, Bld: 172 mg/dL — ABNORMAL HIGH (ref 65–99)
Potassium: 3.7 mmol/L (ref 3.5–5.1)
Sodium: 138 mmol/L (ref 135–145)

## 2016-07-14 LAB — MAGNESIUM: Magnesium: 2 mg/dL (ref 1.7–2.4)

## 2016-07-14 LAB — TROPONIN I
TROPONIN I: 0.15 ng/mL — AB (ref <0.03)
TROPONIN I: 0.19 ng/mL — AB (ref <0.03)
TROPONIN I: 0.17 ng/mL — AB (ref <0.03)

## 2016-07-14 LAB — INFLUENZA PANEL BY PCR (TYPE A & B)
Influenza A By PCR: POSITIVE — AB
Influenza B By PCR: NEGATIVE

## 2016-07-14 LAB — EXPECTORATED SPUTUM ASSESSMENT W GRAM STAIN, RFLX TO RESP C

## 2016-07-14 LAB — TSH: TSH: 3.716 u[IU]/mL (ref 0.350–4.500)

## 2016-07-14 MED ORDER — INSULIN ASPART 100 UNIT/ML ~~LOC~~ SOLN
0.0000 [IU] | Freq: Three times a day (TID) | SUBCUTANEOUS | Status: DC
  Administered 2016-07-14: 3 [IU] via SUBCUTANEOUS
  Administered 2016-07-14 – 2016-07-15 (×4): 2 [IU] via SUBCUTANEOUS
  Administered 2016-07-16 – 2016-07-18 (×4): 1 [IU] via SUBCUTANEOUS
  Administered 2016-07-18: 3 [IU] via SUBCUTANEOUS
  Filled 2016-07-14 (×2): qty 2
  Filled 2016-07-14: qty 1
  Filled 2016-07-14: qty 3
  Filled 2016-07-14: qty 1
  Filled 2016-07-14 (×2): qty 2
  Filled 2016-07-14: qty 1
  Filled 2016-07-14: qty 3
  Filled 2016-07-14: qty 1
  Filled 2016-07-14: qty 2

## 2016-07-14 MED ORDER — ONDANSETRON HCL 4 MG PO TABS: 4.0000 mg | ORAL_TABLET | Freq: Four times a day (QID) | ORAL | Status: DC | PRN

## 2016-07-14 MED ORDER — SODIUM CHLORIDE 0.9% FLUSH
3.0000 mL | Freq: Two times a day (BID) | INTRAVENOUS | Status: DC
  Administered 2016-07-14 – 2016-07-18 (×9): 3 mL via INTRAVENOUS

## 2016-07-14 MED ORDER — ONDANSETRON HCL 4 MG/2ML IJ SOLN: 4.0000 mg | Freq: Four times a day (QID) | INTRAMUSCULAR | Status: DC | PRN

## 2016-07-14 MED ORDER — SODIUM CHLORIDE 0.9 % IV SOLN: 250.0000 mL | INTRAVENOUS | Status: DC | PRN

## 2016-07-14 MED ORDER — ACETAMINOPHEN 325 MG PO TABS
650.0000 mg | ORAL_TABLET | Freq: Four times a day (QID) | ORAL | Status: DC | PRN
  Administered 2016-07-14 – 2016-07-17 (×4): 650 mg via ORAL
  Filled 2016-07-14 (×4): qty 2

## 2016-07-14 MED ORDER — AZITHROMYCIN 500 MG PO TABS
500.0000 mg | ORAL_TABLET | Freq: Every day | ORAL | Status: DC
  Administered 2016-07-14: 500 mg via ORAL
  Filled 2016-07-14: qty 1

## 2016-07-14 MED ORDER — SODIUM CHLORIDE 0.9% FLUSH: 3.0000 mL | INTRAVENOUS | Status: DC | PRN

## 2016-07-14 MED ORDER — ACETAMINOPHEN 650 MG RE SUPP: 650.0000 mg | Freq: Four times a day (QID) | RECTAL | Status: DC | PRN

## 2016-07-14 MED ORDER — HYDROCODONE-ACETAMINOPHEN 5-325 MG PO TABS
1.0000 | ORAL_TABLET | ORAL | Status: DC | PRN
  Administered 2016-07-15 – 2016-07-16 (×2): 1 via ORAL
  Filled 2016-07-14 (×2): qty 1

## 2016-07-14 MED ORDER — ENOXAPARIN SODIUM 40 MG/0.4ML ~~LOC~~ SOLN
40.0000 mg | Freq: Two times a day (BID) | SUBCUTANEOUS | Status: DC
  Administered 2016-07-14 – 2016-07-16 (×5): 40 mg via SUBCUTANEOUS
  Filled 2016-07-14 (×5): qty 0.4

## 2016-07-14 NOTE — Progress Notes (Signed)
MD aware troponin increase 0.12 to 0.15. No new orders.  Iran Sizer M

## 2016-07-14 NOTE — Care Management (Addendum)
Patient presents from home.  She is currently being followed by [facility] and PT.  she lives alone. Has personal care services 2.5 hours a day M-F through [employer].  She does not have any type of med alert.  Provided her with brochure.  She is not on chronic 02 in the home.  She is having extreme dyspnea on nasal cannula and conversation.  If needed, she would be in agreement to go to skilled nursing facility if indicated.  She has cpap at home.  has had a previous intubation.  ID consult is pending.  [facility] is aware of admission. There was a social consult for medication assistance.  Patient denied any issues obtaining meds when asked by CM.  She also has medicare and medicaid

## 2016-07-14 NOTE — Progress Notes (Signed)
RN made aware that patient had 25 beat run of vtach. MD pyreddy notified. Verbal order given to check magnesium level. Order placed.  Will continue to monitor.  Iran Sizer M

## 2016-07-14 NOTE — Consult Note (Signed)
[facility]ious [facility]     Reason for Consult:  ? PNA   Referring Physician: Serita Grit Date of Admission:  07/13/2016   Active Problems:   Acute CHF (congestive heart failure) (HCC)   HPI: [name] is a 66 y.o. female admitted with SOBand cough. On admit wbc 7, no fevers.  [place] neg. BNP  Quite high. CX with possible infiltrate.  She has hx of longstanding LE swelling and lymphedema   She has a hx of morbid obesity,OSA/OHS, hypertension, diabetes, severe aortic stenosis.  Also had in Nov an admission with GBS bacteremia and was intubated, wbc was 29 and bcx + Grp B strep and ucx with E coli.  She has been on suppressive keflex and HH unawrraps for the LE lymphedema.   Past Medical History:  Diagnosis Date  . CAD (coronary artery disease)   . CHF (congestive heart failure) ([place])   . Diabetes mellitus without complication ([place])   . Diastolic CHF ([place])   . Hyperlipemia   . Hypertension   . Hypertension   . OSA (obstructive sleep apnea)   . Severe aortic stenosis    History reviewed. No pertinent surgical history. Social History  Substance Use Topics  . Smoking status: Never Smoker  . Smokeless tobacco: Never Used  . Alcohol use No   Family History  Problem Relation Age of Onset  . Cancer Mother   . Heart failure Father     Allergies:  Allergies  Allergen Reactions  . Tetanus Toxoids Swelling  . Lisinopril Rash  . Niacin Rash  . Sitagliptin Rash  . Sulfamethoxazole-Trimethoprim Rash    Current antibiotics: Antibiotics Given (last 72 hours)    Date/Time Action Medication Dose   07/13/16 2249 Given   cephALEXin (KEFLEX) capsule 500 mg 500 mg   07/14/16 0931 Given   cephALEXin (KEFLEX) capsule 500 mg 500 mg      MEDICATIONS: . amLODipine  5 mg Oral Daily  . aspirin EC  81 mg Oral Daily  . atorvastatin  40 mg Oral q1800  . cephALEXin  500 mg Oral BID  . cloNIDine  0.1 mg Oral BID  . docusate sodium  100 mg Oral BID  . enoxaparin (LOVENOX)  injection  40 mg Subcutaneous BID  . fenofibrate  54 mg Oral Daily  . furosemide  40 mg Intravenous BID  . insulin aspart  0-9 Units Subcutaneous TID WC  . insulin glargine  50 Units Subcutaneous Daily  . ipratropium-albuterol  3 mL Nebulization Q6H  . levofloxacin (LEVAQUIN) IV  500 mg Intravenous Q24H  . losartan  100 mg Oral Daily  . metoprolol succinate  25 mg Oral Daily  . nystatin   Topical QID  . sodium chloride flush  3 mL Intravenous Q12H  . sodium chloride flush  3 mL Intravenous Q12H    Review of Systems - 11 systems reviewed and negative per HPI   OBJECTIVE: Temp:  [98.9 F (37.2 C)-99.2 F (37.3 C)] 98.9 F (37.2 C) (01/19 0100) Pulse Rate:  [45-102] 88 (01/19 1150) Resp:  [26-34] 34 (01/19 0100) BP: (96-133)/(57-79) 105/64 (01/19 1150) SpO2:  [90 %-98 %] 94 % (01/19 1150) Weight:  [140.6 kg (310 lb)] 140.6 kg (310 lb) (01/18 1811) Physical Exam  Constitutional:  oriented to person, place, and time. morbidly obese  HENT: [place]/AT, PERRLA, no scleral icterus Mouth/Throat: Oropharynx is clear and moist. No oropharyngeal exudate.  Cardiovascular: Normal rate, regular rhythm and normal heart sounds. Exam reveals no gallop  and no friction rub.  No murmur heard.  Pulmonary/Chest: bil rhonchi and mild wheeze Neck = supple, no nuchal rigidity Abdominal: Soft. Bowel sounds are normal.  exhibits no distension. There is no tenderness.  Lymphadenopathy: no cervical adenopathy. No axillary adenopathy Neurological: alert and oriented to person, place, and time.  Skin: bil LE wrapped with unnawrap. Thin limbs, no apparent edema Psychiatric: a normal mood and affect.  behavior is normal.   LABS: Results for orders placed or performed during the hospital encounter of 07/13/16 (from the past 48 hour(s))  CBC with Differential/Platelet     Status: Abnormal   Collection Time: 07/13/16  6:36 PM  Result Value Ref Range   WBC 7.5 3.6 - 11.0 K/uL   RBC 4.40 3.80 - 5.20 MIL/uL    Hemoglobin 11.5 (L) 12.0 - 16.0 g/dL   HCT 35.4 35.0 - 47.0 %   MCV 80.6 80.0 - 100.0 fL   MCH 26.1 26.0 - 34.0 pg   MCHC 32.4 32.0 - 36.0 g/dL   RDW 17.2 (H) 11.5 - 14.5 %   Platelets 204 150 - 440 K/uL   Neutrophils Relative % 91 %   Neutro Abs 6.8 (H) 1.4 - 6.5 K/uL   Lymphocytes Relative 4 %   Lymphs Abs 0.3 (L) 1.0 - 3.6 K/uL   Monocytes Relative 5 %   Monocytes Absolute 0.4 0.2 - 0.9 K/uL   Eosinophils Relative 0 %   Eosinophils Absolute 0.0 0 - 0.7 K/uL   Basophils Relative 0 %   Basophils Absolute 0.0 0 - 0.1 K/uL  Brain natriuretic peptide     Status: Abnormal   Collection Time: 07/13/16  6:36 PM  Result Value Ref Range   B Natriuretic Peptide 1,086.0 (H) 0.0 - 100.0 pg/mL  Comprehensive metabolic panel     Status: Abnormal   Collection Time: 07/13/16  6:36 PM  Result Value Ref Range   Sodium 137 135 - 145 mmol/L   Potassium 3.9 3.5 - 5.1 mmol/L   Chloride 106 101 - 111 mmol/L   CO2 21 (L) 22 - 32 mmol/L   Glucose, Bld 183 (H) 65 - 99 mg/dL   BUN 26 (H) 6 - 20 mg/dL   Creatinine, Ser 1.15 (H) 0.44 - 1.00 mg/dL   Calcium 8.5 (L) 8.9 - 10.3 mg/dL   Total Protein 7.2 6.5 - 8.1 g/dL   Albumin 3.8 3.5 - 5.0 g/dL   AST 29 15 - 41 U/L   ALT 17 14 - 54 U/L   Alkaline Phosphatase 55 38 - 126 U/L   Total Bilirubin 1.0 0.3 - 1.2 mg/dL   GFR calc non Af Amer 49 (L) >60 mL/min   GFR calc Af Amer 57 (L) >60 mL/min    Comment: (NOTE) The eGFR has been calculated using the CKD EPI equation. This calculation has not been validated in all clinical situations. eGFR's persistently <60 mL/min signify possible Chronic Kidney Disease.    Anion gap 10 5 - 15  Troponin I     Status: Abnormal   Collection Time: 07/13/16  6:36 PM  Result Value Ref Range   Troponin I 0.12 (HH) <0.03 ng/mL    Comment: CRITICAL RESULT CALLED TO, READ BACK BY AND VERIFIED WITH JENNIFER INGERSOLL 07/13/16 @ 1912  MLK   CULTURE, BLOOD (ROUTINE X 2) w Reflex to ID Panel     Status: None (Preliminary  result)   Collection Time: 07/13/16  7:27 PM  Result Value Ref Range  Specimen Description BLOOD LEFT HAND    Special Requests      BOTTLES DRAWN AEROBIC AND ANAEROBIC AER 8ML ANA 7ML   Culture NO GROWTH < 12 HOURS    Report Status PENDING   CULTURE, BLOOD (ROUTINE X 2) w Reflex to ID Panel     Status: None (Preliminary result)   Collection Time: 07/13/16  7:34 PM  Result Value Ref Range   Specimen Description BLOOD RIGHT HAND    Special Requests      BOTTLES DRAWN AEROBIC AND ANAEROBIC AER 7ML ANA 8ML   Culture NO GROWTH < 12 HOURS    Report Status PENDING   TSH     Status: None   Collection Time: 07/14/16  1:35 AM  Result Value Ref Range   TSH 3.716 0.350 - 4.500 uIU/mL    Comment: Performed by a 3rd Generation assay with a functional sensitivity of <=0.01 uIU/mL.  Troponin I     Status: Abnormal   Collection Time: 07/14/16  1:35 AM  Result Value Ref Range   Troponin I 0.15 (HH) <0.03 ng/mL    Comment: CRITICAL VALUE NOTED. VALUE IS CONSISTENT WITH PREVIOUSLY REPORTED/CALLED VALUE.  TFK  Troponin I     Status: Abnormal   Collection Time: 07/14/16  7:17 AM  Result Value Ref Range   Troponin I 0.19 (HH) <0.03 ng/mL    Comment: CRITICAL VALUE NOTED. VALUE IS CONSISTENT WITH PREVIOUSLY REPORTED/CALLED VALUE. [place].  CBC     Status: Abnormal   Collection Time: 07/14/16  7:17 AM  Result Value Ref Range   WBC 5.9 3.6 - 11.0 K/uL   RBC 4.04 3.80 - 5.20 MIL/uL   Hemoglobin 10.7 (L) 12.0 - 16.0 g/dL   HCT 32.8 (L) 35.0 - 47.0 %   MCV 81.1 80.0 - 100.0 fL   MCH 26.5 26.0 - 34.0 pg   MCHC 32.7 32.0 - 36.0 g/dL   RDW 16.9 (H) 11.5 - 14.5 %   Platelets 169 150 - 440 K/uL  Basic metabolic panel     Status: Abnormal   Collection Time: 07/14/16  7:17 AM  Result Value Ref Range   Sodium 138 135 - 145 mmol/L   Potassium 3.7 3.5 - 5.1 mmol/L   Chloride 105 101 - 111 mmol/L   CO2 24 22 - 32 mmol/L   Glucose, Bld 172 (H) 65 - 99 mg/dL   BUN 29 (H) 6 - 20 mg/dL   Creatinine, Ser 1.21  (H) 0.44 - 1.00 mg/dL   Calcium 8.0 (L) 8.9 - 10.3 mg/dL   GFR calc non Af Amer 46 (L) >60 mL/min   GFR calc Af Amer 53 (L) >60 mL/min    Comment: (NOTE) The eGFR has been calculated using the CKD EPI equation. This calculation has not been validated in all clinical situations. eGFR's persistently <60 mL/min signify possible Chronic Kidney Disease.    Anion gap 9 5 - 15  Magnesium     Status: None   Collection Time: 07/14/16  7:17 AM  Result Value Ref Range   Magnesium 2.0 1.7 - 2.4 mg/dL  Glucose, capillary     Status: Abnormal   Collection Time: 07/14/16 11:47 AM  Result Value Ref Range   Glucose-Capillary 207 (H) 65 - 99 mg/dL  Troponin I     Status: Abnormal   Collection Time: 07/14/16  1:25 PM  Result Value Ref Range   Troponin I 0.17 (HH) <0.03 ng/mL    Comment: CRITICAL RESULT CALLED TO,  READ BACK BY AND VERIFIED WITH STEVE IMHOFF ON 07/14/16 AT 1407 MNS    No components found for: ESR, C REACTIVE PROTEIN MICRO: Recent Results (from the past 720 hour(s))  CULTURE, BLOOD (ROUTINE X 2) w Reflex to ID Panel     Status: None (Preliminary result)   Collection Time: 07/13/16  7:27 PM  Result Value Ref Range Status   Specimen Description BLOOD LEFT HAND  Final   Special Requests   Final    BOTTLES DRAWN AEROBIC AND ANAEROBIC AER 8ML ANA 7ML   Culture NO GROWTH < 12 HOURS  Final   Report Status PENDING  Incomplete  CULTURE, BLOOD (ROUTINE X 2) w Reflex to ID Panel     Status: None (Preliminary result)   Collection Time: 07/13/16  7:34 PM  Result Value Ref Range Status   Specimen Description BLOOD RIGHT HAND  Final   Special Requests   Final    BOTTLES DRAWN AEROBIC AND ANAEROBIC AER 7ML ANA 8ML   Culture NO GROWTH < 12 HOURS  Final   Report Status PENDING  Incomplete    IMAGING: Dg Chest 2 View  Result Date: 07/13/2016 CLINICAL DATA:  Cough and congestion EXAM: CHEST  2 VIEW COMPARISON:  05/03/2016 FINDINGS: Low lung volumes. There is cardiomegaly with central  vascular congestion and mild perihilar edema. Subsegmental atelectasis in the bilateral upper lobes. No large effusion. Cannot exclude mild infiltrate at the left base. IMPRESSION: 1. Cardiomegaly with central vascular congestion and mild perihilar edema. 2. Discoid atelectasis in the upper lobes. Cannot exclude a mild infiltrate at the left lung base Electronically Signed   By: Donavan Foil M.D.   On: 07/13/2016 18:57    Assessment:   [name] is a 66 y.o. female admitted with SOB and cough and likely has CHF exac. She has no fevers, and wbc nml. She is on suppressive keflex and unawraps for her chronic lymphedema and recurrent cellulitis. CXR shows possible atelectasis vs infiltrate.  I think this is mainly CHF exacerbation but she does have some productive sputum at bedside.  Will be helpful to have a flu PCR and procalcitonin.   Recommendations Check flu PCR (ordered) Check procalciton - if normal would dc ceftriaxone and continue only keflex suppressive therapy Send sputum cx (ordered) Continue treatment for CHF  Thank you very much for allowing me to participate in the care of this patient. Please call with questions.   Cheral Marker. Ola Spurr, MD

## 2016-07-14 NOTE — Progress Notes (Signed)
Patient has been approved for room 255 for 2 hours 34 minutes. RN attempted to call for report. Nurse is currently unable to give report. ED nurse to call back when she is ready.   [name]

## 2016-07-14 NOTE — Progress Notes (Signed)
Pt refused to wear CPAP. Nurse aware. No distress noted. Pt on 3.5 liter nasal cannula.

## 2016-07-14 NOTE — Clinical Social Work Note (Signed)
CSW received consult for patient needing assistance getting medication, case manager can assist with this please consult case management.  CSW to sign off, please reconsult if other social work needs arise.  Jones Broom. [place], MSW, [place]  07/14/2016 9:20 AM

## 2016-07-14 NOTE — Progress Notes (Signed)
Initial Heart Failure Clinic appointment scheduled on July 26, 2016 at 9:00am. Thank you

## 2016-07-14 NOTE — Consult Note (Signed)
Reason for Consult: Shortness of breath history failure congestive heart failure Referring Physician: Dr. Ulysees Barns hospitalist Campbell Adamo primary  [name] is an 66 y.o. female.  HPI: Patient has a history of multiple other problems chronic recurrent history failure COPD asthma diastolic congestive heart failure aortic stenosis hypertension diabetes hyperlipidemia obesity obstructive sleep apnea recently was admitted with pneumonia thought to be from group B streptococcus. Patient was treated with antibiotic therapy long-term and followed by infectious disease Dr. Ola Spurr. Patient is currently on long-term Keflex therapy. Patient has had persistent leg swelling and is getting wrapped by home health but started having worsening shortness of breath over the last few days and finally had to come in she had wheezing and a dry cough denies any significant chest pain no fever chills or sweats. Patient has a known history of severe aortic stenosis not a good candidate for invasive therapy and has been treated medically by Dr. Nehemiah Massed now here with recurrent worsening heart failure COPD possible pneumonia or bronchitis.  Past Medical History:  Diagnosis Date  . CAD (coronary artery disease)   . CHF (congestive heart failure) ([place])   . Diabetes mellitus without complication ([place])   . Diastolic CHF ([place])   . Hyperlipemia   . Hypertension   . Hypertension   . OSA (obstructive sleep apnea)   . Severe aortic stenosis     History reviewed. No pertinent surgical history.  Family History  Problem Relation Age of Onset  . Cancer Mother   . Heart failure Father     Social History:  reports that she has never smoked. She has never used smokeless tobacco. She reports that she does not drink alcohol or use drugs.  Allergies:  Allergies  Allergen Reactions  . Tetanus Toxoids Swelling  . Lisinopril Rash  . Niacin Rash  . Sitagliptin Rash  . Sulfamethoxazole-Trimethoprim Rash     Medications: I have reviewed the patient's current medications.  Results for orders placed or performed during the hospital encounter of 07/13/16 (from the past 48 hour(s))  CBC with Differential/Platelet     Status: Abnormal   Collection Time: 07/13/16  6:36 PM  Result Value Ref Range   WBC 7.5 3.6 - 11.0 K/uL   RBC 4.40 3.80 - 5.20 MIL/uL   Hemoglobin 11.5 (L) 12.0 - 16.0 g/dL   HCT 35.4 35.0 - 47.0 %   MCV 80.6 80.0 - 100.0 fL   MCH 26.1 26.0 - 34.0 pg   MCHC 32.4 32.0 - 36.0 g/dL   RDW 17.2 (H) 11.5 - 14.5 %   Platelets 204 150 - 440 K/uL   Neutrophils Relative % 91 %   Neutro Abs 6.8 (H) 1.4 - 6.5 K/uL   Lymphocytes Relative 4 %   Lymphs Abs 0.3 (L) 1.0 - 3.6 K/uL   Monocytes Relative 5 %   Monocytes Absolute 0.4 0.2 - 0.9 K/uL   Eosinophils Relative 0 %   Eosinophils Absolute 0.0 0 - 0.7 K/uL   Basophils Relative 0 %   Basophils Absolute 0.0 0 - 0.1 K/uL  Brain natriuretic peptide     Status: Abnormal   Collection Time: 07/13/16  6:36 PM  Result Value Ref Range   B Natriuretic Peptide 1,086.0 (H) 0.0 - 100.0 pg/mL  Comprehensive metabolic panel     Status: Abnormal   Collection Time: 07/13/16  6:36 PM  Result Value Ref Range   Sodium 137 135 - 145 mmol/L   Potassium 3.9 3.5 - 5.1 mmol/L  Chloride 106 101 - 111 mmol/L   CO2 21 (L) 22 - 32 mmol/L   Glucose, Bld 183 (H) 65 - 99 mg/dL   BUN 26 (H) 6 - 20 mg/dL   Creatinine, Ser 1.15 (H) 0.44 - 1.00 mg/dL   Calcium 8.5 (L) 8.9 - 10.3 mg/dL   Total Protein 7.2 6.5 - 8.1 g/dL   Albumin 3.8 3.5 - 5.0 g/dL   AST 29 15 - 41 U/L   ALT 17 14 - 54 U/L   Alkaline Phosphatase 55 38 - 126 U/L   Total Bilirubin 1.0 0.3 - 1.2 mg/dL   GFR calc non Af Amer 49 (L) >60 mL/min   GFR calc Af Amer 57 (L) >60 mL/min    Comment: (NOTE) The eGFR has been calculated using the CKD EPI equation. This calculation has not been validated in all clinical situations. eGFR's persistently <60 mL/min signify possible Chronic  Kidney Disease.    Anion gap 10 5 - 15  Troponin I     Status: Abnormal   Collection Time: 07/13/16  6:36 PM  Result Value Ref Range   Troponin I 0.12 (HH) <0.03 ng/mL    Comment: CRITICAL RESULT CALLED TO, READ BACK BY AND VERIFIED WITH JENNIFER INGERSOLL 07/13/16 @ 1912  MLK   CULTURE, BLOOD (ROUTINE X 2) w Reflex to ID Panel     Status: None (Preliminary result)   Collection Time: 07/13/16  7:27 PM  Result Value Ref Range   Specimen Description BLOOD LEFT HAND    Special Requests      BOTTLES DRAWN AEROBIC AND ANAEROBIC AER 8ML ANA 7ML   Culture NO GROWTH < 12 HOURS    Report Status PENDING   CULTURE, BLOOD (ROUTINE X 2) w Reflex to ID Panel     Status: None (Preliminary result)   Collection Time: 07/13/16  7:34 PM  Result Value Ref Range   Specimen Description BLOOD RIGHT HAND    Special Requests      BOTTLES DRAWN AEROBIC AND ANAEROBIC AER 7ML ANA 8ML   Culture NO GROWTH < 12 HOURS    Report Status PENDING   TSH     Status: None   Collection Time: 07/14/16  1:35 AM  Result Value Ref Range   TSH 3.716 0.350 - 4.500 uIU/mL    Comment: Performed by a 3rd Generation assay with a functional sensitivity of <=0.01 uIU/mL.  Troponin I     Status: Abnormal   Collection Time: 07/14/16  1:35 AM  Result Value Ref Range   Troponin I 0.15 (HH) <0.03 ng/mL    Comment: CRITICAL VALUE NOTED. VALUE IS CONSISTENT WITH PREVIOUSLY REPORTED/CALLED VALUE.  TFK  Troponin I     Status: Abnormal   Collection Time: 07/14/16  7:17 AM  Result Value Ref Range   Troponin I 0.19 (HH) <0.03 ng/mL    Comment: CRITICAL VALUE NOTED. VALUE IS CONSISTENT WITH PREVIOUSLY REPORTED/CALLED VALUE. [place].  CBC     Status: Abnormal   Collection Time: 07/14/16  7:17 AM  Result Value Ref Range   WBC 5.9 3.6 - 11.0 K/uL   RBC 4.04 3.80 - 5.20 MIL/uL   Hemoglobin 10.7 (L) 12.0 - 16.0 g/dL   HCT 32.8 (L) 35.0 - 47.0 %   MCV 81.1 80.0 - 100.0 fL   MCH 26.5 26.0 - 34.0 pg   MCHC 32.7 32.0 - 36.0 g/dL   RDW 16.9  (H) 11.5 - 14.5 %   Platelets 169 150 - 440  K/uL  Basic metabolic panel     Status: Abnormal   Collection Time: 07/14/16  7:17 AM  Result Value Ref Range   Sodium 138 135 - 145 mmol/L   Potassium 3.7 3.5 - 5.1 mmol/L   Chloride 105 101 - 111 mmol/L   CO2 24 22 - 32 mmol/L   Glucose, Bld 172 (H) 65 - 99 mg/dL   BUN 29 (H) 6 - 20 mg/dL   Creatinine, Ser 1.21 (H) 0.44 - 1.00 mg/dL   Calcium 8.0 (L) 8.9 - 10.3 mg/dL   GFR calc non Af Amer 46 (L) >60 mL/min   GFR calc Af Amer 53 (L) >60 mL/min    Comment: (NOTE) The eGFR has been calculated using the CKD EPI equation. This calculation has not been validated in all clinical situations. eGFR's persistently <60 mL/min signify possible Chronic Kidney Disease.    Anion gap 9 5 - 15  Magnesium     Status: None   Collection Time: 07/14/16  7:17 AM  Result Value Ref Range   Magnesium 2.0 1.7 - 2.4 mg/dL  Glucose, capillary     Status: Abnormal   Collection Time: 07/14/16 11:47 AM  Result Value Ref Range   Glucose-Capillary 207 (H) 65 - 99 mg/dL  Troponin I     Status: Abnormal   Collection Time: 07/14/16  1:25 PM  Result Value Ref Range   Troponin I 0.17 (HH) <0.03 ng/mL    Comment: CRITICAL RESULT CALLED TO, READ BACK BY AND VERIFIED WITH STEVE IMHOFF ON 07/14/16 AT 1407 MNS   Procalcitonin - Baseline     Status: None   Collection Time: 07/14/16  1:25 PM  Result Value Ref Range   Procalcitonin 0.19 ng/mL    Comment:        Interpretation: PCT (Procalcitonin) <= 0.5 ng/mL: Systemic infection (sepsis) is not likely. Local bacterial infection is possible. (NOTE)         ICU PCT Algorithm               Non ICU PCT Algorithm    ----------------------------     ------------------------------         PCT < 0.25 ng/mL                 PCT < 0.1 ng/mL     Stopping of antibiotics            Stopping of antibiotics       strongly encouraged.               strongly encouraged.    ----------------------------      ------------------------------       PCT level decrease by               PCT < 0.25 ng/mL       >= 80% from peak PCT       OR PCT 0.25 - 0.5 ng/mL          Stopping of antibiotics                                             encouraged.     Stopping of antibiotics           encouraged.    ----------------------------     ------------------------------       PCT level decrease by  PCT >= 0.25 ng/mL       < 80% from peak PCT        AND PCT >= 0.5 ng/mL            Continuin g antibiotics                                              encouraged.       Continuing antibiotics            encouraged.    ----------------------------     ------------------------------     PCT level increase compared          PCT > 0.5 ng/mL         with peak PCT AND          PCT >= 0.5 ng/mL             Escalation of antibiotics                                          strongly encouraged.      Escalation of antibiotics        strongly encouraged.     Dg Chest 2 View  Result Date: 07/13/2016 CLINICAL DATA:  Cough and congestion EXAM: CHEST  2 VIEW COMPARISON:  05/03/2016 FINDINGS: Low lung volumes. There is cardiomegaly with central vascular congestion and mild perihilar edema. Subsegmental atelectasis in the bilateral upper lobes. No large effusion. Cannot exclude mild infiltrate at the left base. IMPRESSION: 1. Cardiomegaly with central vascular congestion and mild perihilar edema. 2. Discoid atelectasis in the upper lobes. Cannot exclude a mild infiltrate at the left lung base Electronically Signed   By: Donavan Foil M.D.   On: 07/13/2016 18:57    Review of Systems  Constitutional: Positive for malaise/fatigue.  HENT: Positive for congestion and sinus pain.   Eyes: Negative.   Respiratory: Positive for cough, sputum production and shortness of breath.   Cardiovascular: Positive for palpitations, orthopnea, leg swelling and PND.  Gastrointestinal: Negative.   Genitourinary: Negative.    Musculoskeletal: Positive for myalgias.  Skin: Negative.   Neurological: Positive for weakness.  Endo/Heme/Allergies: Negative.   Psychiatric/Behavioral: Negative.    Blood pressure 105/64, pulse 88, temperature 98.9 F (37.2 C), temperature source Oral, resp. rate (!) 34, height _0  (1.651 m), weight (!) 140.6 kg (310 lb), SpO2 94 %. Physical Exam  Nursing note and vitals reviewed. Constitutional: She is oriented to person, place, and time. She appears well-developed and well-nourished.  HENT:  Head: Normocephalic and atraumatic.  Eyes: Conjunctivae and EOM are normal. Pupils are equal, round, and reactive to light.  Neck: Normal range of motion. Neck supple.  Cardiovascular: Normal rate, regular rhythm and normal heart sounds.   Respiratory: Effort normal. She has decreased breath sounds in the right upper field, the right middle field, the right lower field, the left upper field, the left middle field and the left lower field. She has wheezes in the right upper field, the right middle field, the right lower field, the left upper field, the left middle field and the left lower field. She has rhonchi in the right upper field, the right middle field, the right lower field, the left upper field, the left middle field and  the left lower field. She has rales in the right upper field, the right middle field, the right lower field, the left upper field, the left middle field and the left lower field.  GI: Soft. Bowel sounds are normal.  Musculoskeletal: Normal range of motion.  Neurological: She is alert and oriented to person, place, and time. She has normal reflexes.  Skin: Skin is warm and dry.  Psychiatric: She has a normal mood and affect.    Assessment/Plan: Shortness of breath COPD moderate to severe with exacerbation Asthma Congestive heart failure diastolic Aortic stenosis severe Hypertension  Diabetes type 2 Hyperlipidemia Obesity Obstructive sleep apnea Chronic leg  edema Acute on chronic Respiratory failure . PLAN Continue supplemental oxygen for shortness of breath Agree with diuretics therapy for heart failure Continue metoprolol losartan clonidine for hypertension Congestive heart failure treatment with losartan and metoprolol and amlodipine Diabetes insulin dependent on Lantus and NovoLog Hyperlipidemia treated therapy with Lipitor Recommend inhalers therapy consider referral to pulmonary History of group B strep currently on antibiotic therapy following up with ID Agree with antibiotic therapy for possible pneumonia bronchitis Recommend sleep study CPAP weight loss possible obstructive sleep apnea Continue Plavix for chronic peripheral leg edema   Jekhi Bolin D Karrine Kluttz 07/14/2016, 4:14 PM

## 2016-07-14 NOTE — Progress Notes (Signed)
Pharmacist - Prescriber Communication  Enoxaparin frequency changed to BID for BMI greater than 40.  Jomarie Gellis A. [place], [place].D., BCPS Clinical Pharmacist 07/14/2016 [phone]

## 2016-07-14 NOTE — Consult Note (Signed)
[facility] Nurse wound consult note Reason for Consult: Weekly Unnas boots application.  Nonintact lesion to left lower leg.  Wound type:Chronic venous stasis Pressure Injury POA: N/A Measurement: 3 cmx 2.4 cm x 0.1 cm  Wound DOD:QVHQ pink Drainage (amount, consistency, odor) Minimal serosanguinous  No odor.  Periwound:Chronic skin changes to both legs.  Dry skin Dressing procedure/placement/frequency:Cleanse bilateral lower legs with soap and water.  Apply calcium alginate to left lower leg wound.  Wrap with zinc layer secured with self adherent COban to both legs.  Change twice weekly.  Tuesday and Friday.  [facility] team will follow.  Domenic Moras RN BSN [place] Pager [phone]

## 2016-07-15 LAB — GLUCOSE, CAPILLARY
GLUCOSE-CAPILLARY: 168 mg/dL — AB (ref 65–99)
GLUCOSE-CAPILLARY: 197 mg/dL — AB (ref 65–99)
Glucose-Capillary: 153 mg/dL — ABNORMAL HIGH (ref 65–99)
Glucose-Capillary: 158 mg/dL — ABNORMAL HIGH (ref 65–99)

## 2016-07-15 MED ORDER — OSELTAMIVIR PHOSPHATE 75 MG PO CAPS
75.0000 mg | ORAL_CAPSULE | Freq: Two times a day (BID) | ORAL | Status: DC
  Administered 2016-07-15 – 2016-07-18 (×7): 75 mg via ORAL
  Filled 2016-07-15 (×7): qty 1

## 2016-07-15 MED ORDER — ALPRAZOLAM 0.25 MG PO TABS: 0.2500 mg | ORAL_TABLET | Freq: Once | ORAL | Status: DC

## 2016-07-15 NOTE — Progress Notes (Signed)
Physical Therapy Evaluation Patient Details Name: [name] MRN: [id number] DOB: 02-Apr-1951 Today's Date: 07/15/2016   History of Present Illness  Patient is a 66 y.o. female admitted on 41 JAN with SOB and acute CHF. PMH includes CAD, severe aortic stenosis, diastolic CHF, DMII, essential HTN, and hyperlipidema. Patient has influenza A.   Clinical Impression  Patient is a pleasant female admitted for above reasons. Patient was previously admitted to [facility] and discharged with HHPT with patient admittedly working to increase walking endurance. Patient's groceries are purchased by cousin who also is responsible for transporting patient to appointments. Patient otherwise does not leave apartment. Upon evaluation, patient demonstrates need for assistance in both bed mobility and transfers. She demonstrates decreased muscle strength, endurance, and function. Patient will benefit from skilled and progressive PT to return to [facility] but will also benefit from continued f/u at SNF at discharge when medically appropriate.    Follow Up Recommendations SNF    Equipment Recommendations  None recommended by PT    Recommendations for Other Services       Precautions / Restrictions Precautions Precautions: Fall Restrictions Weight Bearing Restrictions: No      Mobility  Bed Mobility Overal bed mobility: Needs Assistance Bed Mobility: Supine to Sit     Supine to sit: Min assist     General bed mobility comments: Patient requires minimal assistance to move from supine to sit with HOB elevated.  Transfers Overall transfer level: Needs assistance Equipment used: Rolling walker (2 wheeled) Transfers: Sit to/from [facility] Sit to Stand: Mod assist Stand pivot transfers: Min guard       General transfer comment: Patient required 5 attempts to perform sit to stand, limited more by feelings of weakness than actual weakness. Once standing, able to move to chair with stand  pivot with CGA.  Ambulation/Gait                Stairs            Wheelchair Mobility    Modified Rankin (Stroke Patients Only)       Balance Overall balance assessment: Needs assistance Sitting-balance support: Feet supported;Single extremity supported Sitting balance-Leahy Scale: Fair     Standing balance support: Bilateral upper extremity supported Standing balance-Leahy Scale: Fair                               Pertinent Vitals/Pain Pain Assessment: No/denies pain    Home Living Family/patient expects to be discharged to:: Private residence Living Arrangements: Alone Available Help at Discharge: Family;Available PRN/intermittently Type of Home: Apartment Home Access: Stairs to enter Entrance Stairs-Rails: None Entrance Stairs-Number of Steps: 2 Home Layout: One level Home Equipment: Walker - 2 wheels;Walker - 4 wheels;Wheelchair - manual;Shower seat Additional Comments: Patient had rented manual w/c but no longer has it.    Prior Function Level of Independence: Needs assistance   Gait / Transfers Assistance Needed: Patient was learning to ambulate short distances with RW with HHPT.  ADL's / Homemaking Assistance Needed: Performed by her cousin        Hand Dominance        Extremity/Trunk Assessment   Upper Extremity Assessment Upper Extremity Assessment: Generalized weakness    Lower Extremity Assessment Lower Extremity Assessment: Generalized weakness       Communication   Communication: No difficulties  Cognition Arousal/Alertness: Awake/alert Behavior During Therapy: WFL for tasks assessed/performed Overall Cognitive Status: Within Functional Limits for  tasks assessed                      General Comments      Exercises     Assessment/Plan    PT Assessment Patient needs continued PT services  PT Problem List Decreased strength;Decreased balance;Decreased activity tolerance;Decreased  mobility;Decreased knowledge of use of DME;Decreased safety awareness;Cardiopulmonary status limiting activity;Obesity          PT Treatment Interventions DME instruction;Gait training;Stair training;Functional mobility training;Therapeutic activities;Therapeutic exercise;Balance training;Patient/family education    PT Goals (Current goals can be found in the Care Plan section)  Acute Rehab PT Goals Patient Stated Goal: "To feel better" PT Goal Formulation: With patient Time For Goal Achievement: 07/29/16 Potential to Achieve Goals: Good    Frequency Min 2X/week   Barriers to discharge Inaccessible home environment;Decreased caregiver support      Co-evaluation               End of Session Equipment Utilized During Treatment: Gait belt;Oxygen Activity Tolerance: Patient tolerated treatment well;Patient limited by lethargy;Patient limited by fatigue Patient left: in chair;with call bell/phone within reach;with chair alarm set Nurse Communication: Mobility status         Time: 1510-1540 PT Time Calculation (min) (ACUTE ONLY): 30 min   Charges:   PT Evaluation $PT Eval Low Complexity: 1 Procedure     PT G Codes:        Dorice Lamas, PT, DPT 07/15/2016, 3:53 PM

## 2016-07-15 NOTE — Progress Notes (Signed)
[facility] at [place] NAME: [name]    MR#:  [id number]  DATE OF BIRTH:  [birth year]  SUBJECTIVE:  CHIEF COMPLAINT:   Chief Complaint  Patient presents with  . Shortness of Breath   His multiple and repeated infections and cellulitis on both legs and has been been on treatment by infectious disease specialist on oral Keflex. Skin with shortness of breath and congestive heart failure. With IV Lasix feels little better.  Also have some concern of having pneumonia on chest x-ray and patient is short of breath with coughing.  Flu test reported positive. Have c/o lot of coughing in night and having urinary incontinence each time, so could not sleep , requesting to get foley for her comfort.  REVIEW OF SYSTEMS:  CONSTITUTIONAL: No fever, fatigue or weakness.  EYES: No blurred or double vision.  EARS, NOSE, AND THROAT: No tinnitus or ear pain.  RESPIRATORY: Positive for cough, shortness of breath, wheezing , no hemoptysis.  CARDIOVASCULAR: No chest pain, orthopnea, edema.  GASTROINTESTINAL: No nausea, vomiting, diarrhea or abdominal pain.  GENITOURINARY: No dysuria, hematuria.  ENDOCRINE: No polyuria, nocturia,  HEMATOLOGY: No anemia, easy bruising or bleeding SKIN: No rash or lesion. MUSCULOSKELETAL: No joint pain or arthritis.   NEUROLOGIC: No tingling, numbness, weakness.  PSYCHIATRY: No anxiety or depression.   ROS  DRUG ALLERGIES:   Allergies  Allergen Reactions  . Tetanus Toxoids Swelling  . Lisinopril Rash  . Niacin Rash  . Sitagliptin Rash  . Sulfamethoxazole-Trimethoprim Rash    VITALS:  Blood pressure 102/61, pulse 92, temperature 99.2 F (37.3 C), temperature source Oral, resp. rate 16, height 5\' 5"  (1.651 m), weight (!) 140.6 kg (310 lb), SpO2 93 %.  PHYSICAL EXAMINATION:  GENERAL:  66 y.o.-year-old patient lying in the bed with no acute distress.  EYES: Pupils equal, round, reactive to light and accommodation. No  scleral icterus. Extraocular muscles intact.  HEENT: Head atraumatic, normocephalic. Oropharynx and nasopharynx clear.  NECK:  Supple, no jugular venous distention. No thyroid enlargement, no tenderness.  LUNGS: Normal breath sounds bilaterally, no wheezing, some crepitation. No use of accessory muscles of respiration.  CARDIOVASCULAR: S1, S2 normal. No murmurs, rubs, or gallops.  ABDOMEN: Soft, nontender, nondistended. Bowel sounds present. No organomegaly or mass.  EXTREMITIES: b/l pedal edema, unaboots present, no cyanosis, or clubbing.  NEUROLOGIC: Cranial nerves II through XII are intact. Muscle strength 5/5 in all extremities. Sensation intact. Gait not checked.  PSYCHIATRIC: The patient is alert and oriented x 3.  SKIN: No obvious rash, lesion, or ulcer.   Physical Exam LABORATORY PANEL:   CBC  Recent Labs Lab 07/14/16 0717  WBC 5.9  HGB 10.7*  HCT 32.8*  PLT 169   ------------------------------------------------------------------------------------------------------------------  Chemistries   Recent Labs Lab 07/13/16 1836 07/14/16 0717  NA 137 138  K 3.9 3.7  CL 106 105  CO2 21* 24  GLUCOSE 183* 172*  BUN 26* 29*  CREATININE 1.15* 1.21*  CALCIUM 8.5* 8.0*  MG  --  2.0  AST 29  --   ALT 17  --   ALKPHOS 55  --   BILITOT 1.0  --    ------------------------------------------------------------------------------------------------------------------  Cardiac Enzymes  Recent Labs Lab 07/14/16 0717 07/14/16 1325  TROPONINI 0.19* 0.17*   ------------------------------------------------------------------------------------------------------------------  RADIOLOGY:  Dg Chest 2 View  Result Date: 07/13/2016 CLINICAL DATA:  Cough and congestion EXAM: CHEST  2 VIEW COMPARISON:  05/03/2016 FINDINGS: Low lung volumes. There is cardiomegaly with  central vascular congestion and mild perihilar edema. Subsegmental atelectasis in the bilateral upper lobes. No large  effusion. Cannot exclude mild infiltrate at the left base. IMPRESSION: 1. Cardiomegaly with central vascular congestion and mild perihilar edema. 2. Discoid atelectasis in the upper lobes. Cannot exclude a mild infiltrate at the left lung base Electronically Signed   By: Donavan Foil M.D.   On: 07/13/2016 18:57    ASSESSMENT AND PLAN:   Active Problems:   Acute CHF (congestive heart failure) ([place])  Patient is a 66 year old with known history of severe aortic stenosis presents with shortness of breath  1. Acute respiratory failure, Influenza A Suspect multifactorial including acute diastolic CHF, pneumonia associated with bronchospasm  treat with IV Lasix  on nebulizer therapy,   Azithromycin for atypical pneumonia, but stop now as procalcitonin is low.  She is already o oral keflex, called ID consult.  For influenza- on tamiflu.   Sent Sputum cx.  2. Diabetes type 2 Continued therapy with Lantus place on sliding scale  3. Essential hypertension continue amlodipine and clonidine and Cozaar  4. Hyperlipidemia unspecified continue therapy with atorvastatin TriCor  5. Group B Streptococcus bacteremia as per ID she is on Keflex which will continue  6. Coronary artery disease with elevated troponin we'll cycle enzymes I will place her on aspirin cardiology consult has been ordered HFP and elevated troponin   All the records are reviewed and case discussed with Care Management/Social Workerr. Management plans discussed with the patient, family and they are in agreement.  CODE STATUS: Full.  TOTAL TIME TAKING CARE OF THIS PATIENT: 35 minutes.    POSSIBLE D/C IN 2-3 DAYS, DEPENDING ON CLINICAL CONDITION.   Vaughan Basta M.D on 07/15/2016   Between 7am to 6pm - Pager - [phone]  After 6pm go to [email or web address] - password [email or web address] [facility]  Office  [phone]  CC: Primary care physician; Frazier Richards, MD  Note: This dictation was  prepared with Dragon dictation along with smaller phrase technology. Any transcriptional errors that result from this process are unintentional.

## 2016-07-15 NOTE — Progress Notes (Signed)
[facility] at [place] NAME: [name]    MR#:  [id number]  DATE OF BIRTH:  01-Apr-1951  SUBJECTIVE:  CHIEF COMPLAINT:   Chief Complaint  Patient presents with  . Shortness of Breath   His multiple and repeated infections and cellulitis on both legs and has been been on treatment by infectious disease specialist on oral Keflex. Skin with shortness of breath and congestive heart failure. With IV Lasix feels little better.  Also have some concern of having pneumonia on chest x-ray and patient is short of breath with coughing.  REVIEW OF SYSTEMS:  CONSTITUTIONAL: No fever, fatigue or weakness.  EYES: No blurred or double vision.  EARS, NOSE, AND THROAT: No tinnitus or ear pain.  RESPIRATORY: Positive for cough, shortness of breath, wheezing , no hemoptysis.  CARDIOVASCULAR: No chest pain, orthopnea, edema.  GASTROINTESTINAL: No nausea, vomiting, diarrhea or abdominal pain.  GENITOURINARY: No dysuria, hematuria.  ENDOCRINE: No polyuria, nocturia,  HEMATOLOGY: No anemia, easy bruising or bleeding SKIN: No rash or lesion. MUSCULOSKELETAL: No joint pain or arthritis.   NEUROLOGIC: No tingling, numbness, weakness.  PSYCHIATRY: No anxiety or depression.   ROS  DRUG ALLERGIES:   Allergies  Allergen Reactions  . Tetanus Toxoids Swelling  . Lisinopril Rash  . Niacin Rash  . Sitagliptin Rash  . Sulfamethoxazole-Trimethoprim Rash    VITALS:  Blood pressure 102/61, pulse 92, temperature 99.2 F (37.3 C), temperature source Oral, resp. rate 16, height 5\' 5"  (1.651 m), weight (!) 140.6 kg (310 lb), SpO2 93 %.  PHYSICAL EXAMINATION:  GENERAL:  66 y.o.-year-old patient lying in the bed with no acute distress.  EYES: Pupils equal, round, reactive to light and accommodation. No scleral icterus. Extraocular muscles intact.  HEENT: Head atraumatic, normocephalic. Oropharynx and nasopharynx clear.  NECK:  Supple, no jugular venous distention. No  thyroid enlargement, no tenderness.  LUNGS: Normal breath sounds bilaterally, no wheezing, some crepitation. No use of accessory muscles of respiration.  CARDIOVASCULAR: S1, S2 normal. No murmurs, rubs, or gallops.  ABDOMEN: Soft, nontender, nondistended. Bowel sounds present. No organomegaly or mass.  EXTREMITIES: b/l pedal edema, unaboots present, no cyanosis, or clubbing.  NEUROLOGIC: Cranial nerves II through XII are intact. Muscle strength 5/5 in all extremities. Sensation intact. Gait not checked.  PSYCHIATRIC: The patient is alert and oriented x 3.  SKIN: No obvious rash, lesion, or ulcer.   Physical Exam LABORATORY PANEL:   CBC  Recent Labs Lab 07/14/16 0717  WBC 5.9  HGB 10.7*  HCT 32.8*  PLT 169   ------------------------------------------------------------------------------------------------------------------  Chemistries   Recent Labs Lab 07/13/16 1836 07/14/16 0717  NA 137 138  K 3.9 3.7  CL 106 105  CO2 21* 24  GLUCOSE 183* 172*  BUN 26* 29*  CREATININE 1.15* 1.21*  CALCIUM 8.5* 8.0*  MG  --  2.0  AST 29  --   ALT 17  --   ALKPHOS 55  --   BILITOT 1.0  --    ------------------------------------------------------------------------------------------------------------------  Cardiac Enzymes  Recent Labs Lab 07/14/16 0717 07/14/16 1325  TROPONINI 0.19* 0.17*   ------------------------------------------------------------------------------------------------------------------  RADIOLOGY:  Dg Chest 2 View  Result Date: 07/13/2016 CLINICAL DATA:  Cough and congestion EXAM: CHEST  2 VIEW COMPARISON:  05/03/2016 FINDINGS: Low lung volumes. There is cardiomegaly with central vascular congestion and mild perihilar edema. Subsegmental atelectasis in the bilateral upper lobes. No large effusion. Cannot exclude mild infiltrate at the left base. IMPRESSION: 1. Cardiomegaly with central  vascular congestion and mild perihilar edema. 2. Discoid atelectasis in the  upper lobes. Cannot exclude a mild infiltrate at the left lung base Electronically Signed   By: Donavan Foil M.D.   On: 07/13/2016 18:57    ASSESSMENT AND PLAN:   Active Problems:   Acute CHF (congestive heart failure) ([place])  Patient is a 66 year old with known history of severe aortic stenosis presents with shortness of breath  1. Acute respiratory failure Suspect multifactorial including acute diastolic CHF, pneumonia associated with bronchospasm  treat with IV Lasix We'll place her on nebulizer therapy,  Azithromycin for atypical pneumonia  She is already o oral keflex, called ID consult.  2. Diabetes type 2 Continued therapy with Lantus place on sliding scale  3. Essential hypertension continue amlodipine and clonidine and Cozaar  4. Hyperlipidemia unspecified continue therapy with atorvastatin TriCor  5. Group B Streptococcus bacteremia as per ID she is on Keflex which will continue  6. Coronary artery disease with elevated troponin we'll cycle enzymes I will place her on aspirin cardiology consult has been ordered HFP and elevated troponin     All the records are reviewed and case discussed with Care Management/Social Workerr. Management plans discussed with the patient, family and they are in agreement.  CODE STATUS: Full.  TOTAL TIME TAKING CARE OF THIS PATIENT: 35 minutes.     POSSIBLE D/C IN 2-3 DAYS, DEPENDING ON CLINICAL CONDITION.   Vaughan Basta M.D on 07/15/2016   Between 7am to 6pm - Pager - [phone]  After 6pm go to [email or web address] - password [email or web address] [facility]  Office  [phone]  CC: Primary care physician; Frazier Richards, MD  Note: This dictation was prepared with Dragon dictation along with smaller phrase technology. Any transcriptional errors that result from this process are unintentional.

## 2016-07-15 NOTE — Progress Notes (Signed)
ID E note Flu+ DC azithro and ceftriaxone Cont suppressive keflex Cont 5 days tamiflu Treat CHF

## 2016-07-15 NOTE — Progress Notes (Signed)
Pt converted  from sinus rhythm to ventricular bigeminy around 03:30 and then to vent trigeminy around 04:00. Pt asymptomatic and sleeping with no noted distress. VS stable. MD Pyreddy made aware. Converted to sinus rhythm at 04:50. Will continue to monitor.

## 2016-07-16 LAB — BASIC METABOLIC PANEL
ANION GAP: 9 (ref 5–15)
BUN: 28 mg/dL — ABNORMAL HIGH (ref 6–20)
CO2: 28 mmol/L (ref 22–32)
Calcium: 7.9 mg/dL — ABNORMAL LOW (ref 8.9–10.3)
Chloride: 105 mmol/L (ref 101–111)
Creatinine, Ser: 0.96 mg/dL (ref 0.44–1.00)
GFR calc Af Amer: >60 mL/min (ref >60)
GLUCOSE: 122 mg/dL — AB (ref 65–99)
POTASSIUM: 3.3 mmol/L — AB (ref 3.5–5.1)
Sodium: 142 mmol/L (ref 135–145)

## 2016-07-16 LAB — GLUCOSE, CAPILLARY
GLUCOSE-CAPILLARY: 131 mg/dL — AB (ref 65–99)
GLUCOSE-CAPILLARY: 160 mg/dL — AB (ref 65–99)
Glucose-Capillary: 103 mg/dL — ABNORMAL HIGH (ref 65–99)
Glucose-Capillary: 145 mg/dL — ABNORMAL HIGH (ref 65–99)

## 2016-07-16 LAB — PROCALCITONIN

## 2016-07-16 MED ORDER — POTASSIUM CHLORIDE CRYS ER 20 MEQ PO TBCR
40.0000 meq | EXTENDED_RELEASE_TABLET | Freq: Every day | ORAL | Status: DC
  Administered 2016-07-16 – 2016-07-18 (×3): 40 meq via ORAL
  Filled 2016-07-16 (×3): qty 2

## 2016-07-16 NOTE — Progress Notes (Signed)
[facility] at [place] NAME: [name]    MR#:  [id number]  DATE OF BIRTH:  1951-06-17  SUBJECTIVE:  CHIEF COMPLAINT:   Chief Complaint  Patient presents with  . Shortness of Breath   His multiple and repeated infections and cellulitis on both legs and has been been on treatment by infectious disease specialist on oral Keflex. Skin with shortness of breath and congestive heart failure. With IV Lasix feels little better.  Also have some concern of having pneumonia on chest x-ray and patient is short of breath with coughing.  Flu test reported positive.  Comfortable but very weak today.  REVIEW OF SYSTEMS:  CONSTITUTIONAL: No fever, fatigue or weakness.  EYES: No blurred or double vision.  EARS, NOSE, AND THROAT: No tinnitus or ear pain.  RESPIRATORY: Positive for cough, shortness of breath, wheezing , no hemoptysis.  CARDIOVASCULAR: No chest pain, orthopnea, edema.  GASTROINTESTINAL: No nausea, vomiting, diarrhea or abdominal pain.  GENITOURINARY: No dysuria, hematuria.  ENDOCRINE: No polyuria, nocturia,  HEMATOLOGY: No anemia, easy bruising or bleeding SKIN: No rash or lesion. MUSCULOSKELETAL: No joint pain or arthritis.   NEUROLOGIC: No tingling, numbness, weakness.  PSYCHIATRY: No anxiety or depression.   ROS  DRUG ALLERGIES:   Allergies  Allergen Reactions  . Tetanus Toxoids Swelling  . Lisinopril Rash  . Niacin Rash  . Sitagliptin Rash  . Sulfamethoxazole-Trimethoprim Rash    VITALS:  Blood pressure 121/66, pulse 88, temperature 98.1 F (36.7 C), temperature source Oral, resp. rate 18, height 5\' 5"  (1.651 m), weight (!) 140.6 kg (310 lb), SpO2 97 %.  PHYSICAL EXAMINATION:  GENERAL:  66 y.o.-year-old patient lying in the bed with no acute distress.  EYES: Pupils equal, round, reactive to light and accommodation. No scleral icterus. Extraocular muscles intact.  HEENT: Head atraumatic, normocephalic. Oropharynx and  nasopharynx clear.  NECK:  Supple, no jugular venous distention. No thyroid enlargement, no tenderness.  LUNGS: Normal breath sounds bilaterally, no wheezing, some crepitation. No use of accessory muscles of respiration.  CARDIOVASCULAR: S1, S2 normal. No murmurs, rubs, or gallops.  ABDOMEN: Soft, nontender, nondistended. Bowel sounds present. No organomegaly or mass.  EXTREMITIES: b/l pedal edema, unaboots present, no cyanosis, or clubbing.  NEUROLOGIC: Cranial nerves II through XII are intact. Muscle strength 5/5 in all extremities. Sensation intact. Gait not checked.  PSYCHIATRIC: The patient is alert and oriented x 3.  SKIN: No obvious rash, lesion, or ulcer.   Physical Exam LABORATORY PANEL:   CBC  Recent Labs Lab 07/14/16 0717  WBC 5.9  HGB 10.7*  HCT 32.8*  PLT 169   ------------------------------------------------------------------------------------------------------------------  Chemistries   Recent Labs Lab 07/13/16 1836 07/14/16 0717 07/16/16 0450  NA 137 138 142  K 3.9 3.7 3.3*  CL 106 105 105  CO2 21* 24 28  GLUCOSE 183* 172* 122*  BUN 26* 29* 28*  CREATININE 1.15* 1.21* 0.96  CALCIUM 8.5* 8.0* 7.9*  MG  --  2.0  --   AST 29  --   --   ALT 17  --   --   ALKPHOS 55  --   --   BILITOT 1.0  --   --    ------------------------------------------------------------------------------------------------------------------  Cardiac Enzymes  Recent Labs Lab 07/14/16 0717 07/14/16 1325  TROPONINI 0.19* 0.17*   ------------------------------------------------------------------------------------------------------------------  RADIOLOGY:  No results found.  ASSESSMENT AND PLAN:   Active Problems:   Acute CHF (congestive heart failure) [facility])  Patient is a 66 year old  with known history of severe aortic stenosis presents with shortness of breath  1. Acute respiratory failure, Influenza A Suspect multifactorial including acute diastolic CHF, pneumonia (  suspected on admission but ruled out) associated with bronchospasm  treat with IV Lasix  on nebulizer therapy,   Azithromycin for atypical pneumonia, but stop now as procalcitonin is low.  She is already o oral keflex, called ID consult.  For influenza- on tamiflu.   Sent Sputum cx.  2. Diabetes type 2 Continued therapy with Lantus place on sliding scale  3. Essential hypertension continue amlodipine and clonidine and Cozaar  4. Hyperlipidemia unspecified continue therapy with atorvastatin TriCor  5. Group B Streptococcus bacteremia as per ID she is on Keflex which will continue  6. Coronary artery disease with elevated troponin we'll cycle enzymes I will place her on aspirin cardiology consult has been ordered HFP and elevated troponin   All the records are reviewed and case discussed with Care Management/Social Workerr. Management plans discussed with the patient, family and they are in agreement.  CODE STATUS: Full.  TOTAL TIME TAKING CARE OF THIS PATIENT: 35 minutes.   As per PT eval- need SNF, placement tomorrow. [facility]  POSSIBLE D/C IN 2-3 DAYS, DEPENDING ON CLINICAL CONDITION.   Vaughan Basta M.D on 07/16/2016   Between 7am to 6pm - Pager - [phone]  After 6pm go to [email or web address] - password [email or web address] [facility]  Office  [phone]  CC: Primary care physician; Frazier Richards, MD  Note: This dictation was prepared with Dragon dictation along with smaller phrase technology. Any transcriptional errors that result from this process are unintentional.

## 2016-07-16 NOTE — Progress Notes (Signed)
Upon hourly rounding pink tinged urine noted in foley bag and tubing. MD notified. Order to discontinue lovenox and implement SCDS. I will continue to assess.

## 2016-07-17 LAB — BASIC METABOLIC PANEL
Anion gap: 7 (ref 5–15)
BUN: 30 mg/dL — AB (ref 6–20)
CO2: 32 mmol/L (ref 22–32)
CREATININE: 0.85 mg/dL (ref 0.44–1.00)
Calcium: 8 mg/dL — ABNORMAL LOW (ref 8.9–10.3)
Chloride: 101 mmol/L (ref 101–111)
GFR calc Af Amer: >60 mL/min (ref >60)
GLUCOSE: 109 mg/dL — AB (ref 65–99)
POTASSIUM: 3.7 mmol/L (ref 3.5–5.1)
Sodium: 140 mmol/L (ref 135–145)

## 2016-07-17 LAB — GLUCOSE, CAPILLARY
Glucose-Capillary: 107 mg/dL — ABNORMAL HIGH (ref 65–99)
Glucose-Capillary: 143 mg/dL — ABNORMAL HIGH (ref 65–99)
Glucose-Capillary: 111 mg/dL — ABNORMAL HIGH (ref 65–99)
Glucose-Capillary: 156 mg/dL — ABNORMAL HIGH (ref 65–99)

## 2016-07-17 MED ORDER — CEPHALEXIN 500 MG PO CAPS: 500.0000 mg | ORAL_CAPSULE | Freq: Two times a day (BID) | ORAL | 0 refills | Status: AC

## 2016-07-17 MED ORDER — OSELTAMIVIR PHOSPHATE 75 MG PO CAPS: 75.0000 mg | ORAL_CAPSULE | Freq: Two times a day (BID) | ORAL | 0 refills | Status: AC

## 2016-07-17 MED ORDER — CEPHALEXIN 500 MG PO CAPS: 500.0000 mg | ORAL_CAPSULE | Freq: Two times a day (BID) | ORAL | 0 refills | Status: DC

## 2016-07-17 MED ORDER — FUROSEMIDE 40 MG PO TABS: 40.0000 mg | ORAL_TABLET | Freq: Every day | ORAL | 0 refills | Status: DC

## 2016-07-17 MED ORDER — POTASSIUM CHLORIDE CRYS ER 10 MEQ PO TBCR: 10.0000 meq | EXTENDED_RELEASE_TABLET | Freq: Every day | ORAL | 0 refills | Status: DC

## 2016-07-17 MED ORDER — IPRATROPIUM-ALBUTEROL 0.5-2.5 (3) MG/3ML IN SOLN: 3.0000 mL | Freq: Four times a day (QID) | RESPIRATORY_TRACT | 0 refills | Status: DC

## 2016-07-17 NOTE — Discharge Summary (Signed)
[facility] [place] NAME: Arshiya Jakes    MR#:  [id number]  DATE OF BIRTH:  12-14-1950  DATE OF ADMISSION:  07/13/2016 ADMITTING PHYSICIAN: Dustin Flock, MD  DATE OF DISCHARGE: 07/17/2016   PRIMARY CARE PHYSICIAN: Frazier Richards, MD    ADMISSION DIAGNOSIS:  SOB (shortness of breath) [R06.02]  DISCHARGE DIAGNOSIS:  Active Problems:   Acute CHF (congestive heart failure) (HCC)   cellulitis   Influenza A  SECONDARY DIAGNOSIS:   Past Medical History:  Diagnosis Date  . CAD (coronary artery disease)   . CHF (congestive heart failure) ([place])   . Diabetes mellitus without complication ([place])   . Diastolic CHF ([place])   . Hyperlipemia   . Hypertension   . Hypertension   . OSA (obstructive sleep apnea)   . Severe aortic stenosis     HOSPITAL COURSE:   1. Acute respiratory failure, Influenza A Suspect multifactorial including acute diastolic CHF, pneumonia ( suspected on admission but ruled out) associated with bronchospasm  treat with IV Lasix  on nebulizer therapy,   Azithromycin for atypical pneumonia, but stop now as procalcitonin is low.  She is already o oral keflex, called ID consult.  For influenza- on tamiflu.   Sent Sputum cx.   She have appointment in heart failure clinic and I advised about fluid restriction.  2. Diabetes type 2 Continued therapy with Lantus place on sliding scale  3. Essential hypertension continue amlodipine and clonidine and Cozaar  4. Hyperlipidemia unspecified continue therapy with atorvastatin TriCor  5. Group B Streptococcus bacteremia as per ID she is on Keflex which will continue for 2 more weeks, then follow with ID clinic.  6. Coronary artery disease with elevated troponin we'll cycle enzymes I will place her on aspirin cardiology consult has been ordered HFP and elevated troponin   DISCHARGE CONDITIONS:   Stable.  CONSULTS OBTAINED:  Treatment Team:  Yolonda Kida, MD Isaias Cowman, MD Leonel Ramsay, MD  DRUG ALLERGIES:   Allergies  Allergen Reactions  . Tetanus Toxoids Swelling  . Lisinopril Rash  . Niacin Rash  . Sitagliptin Rash  . Sulfamethoxazole-Trimethoprim Rash    DISCHARGE MEDICATIONS:   Current Discharge Medication List    START taking these medications   Details  ipratropium-albuterol (DUONEB) 0.5-2.5 (3) MG/3ML SOLN Take 3 mLs by nebulization every 6 (six) hours. Qty: 360 mL, Refills: 0    oseltamivir (TAMIFLU) 75 MG capsule Take 1 capsule (75 mg total) by mouth 2 (two) times daily. Qty: 4 capsule, Refills: 0    potassium chloride SA (K-DUR,KLOR-CON) 10 MEQ tablet Take 1 tablet (10 mEq total) by mouth daily. Qty: 30 tablet, Refills: 0      CONTINUE these medications which have CHANGED   Details  !! cephALEXin (KEFLEX) 500 MG capsule Take 1 capsule (500 mg total) by mouth 2 (two) times daily. Qty: 28 capsule, Refills: 0    furosemide (LASIX) 40 MG tablet Take 1 tablet (40 mg total) by mouth daily. Qty: 30 tablet, Refills: 0     !! - Potential duplicate medications found. Please discuss with provider.    CONTINUE these medications which have NOT CHANGED   Details  aspirin EC 81 MG tablet Take 81 mg by mouth daily.    atorvastatin (LIPITOR) 40 MG tablet Take 1 tablet (40 mg total) by mouth daily at 6 PM. Qty: 30 tablet, Refills: 0    !! cephALEXin (KEFLEX) 500 MG capsule Take  500 mg by mouth 2 (two) times daily.    cloNIDine (CATAPRES) 0.1 MG tablet Take 1 tablet by mouth 2 (two) times daily.    docusate sodium (COLACE) 100 MG capsule Take 1 capsule (100 mg total) by mouth 2 (two) times daily. Qty: 10 capsule, Refills: 0    fenofibrate (TRICOR) 145 MG tablet Take 1 tablet by mouth daily.    LANTUS SOLOSTAR 100 UNIT/ML Solostar Pen Inject 50 Units into the skin daily. Titrate for morning sugars less than 120    nystatin (MYCOSTATIN/NYSTOP) powder Apply topically 4 (four) times daily.      !! insulin aspart (NOVOLOG) 100 UNIT/ML injection Inject 0-20 Units into the skin 3 (three) times daily with meals. Qty: 10 mL, Refills: 11    !! insulin aspart (NOVOLOG) 100 UNIT/ML injection Inject 0-5 Units into the skin at bedtime. Qty: 10 mL, Refills: 11     !! - Potential duplicate medications found. Please discuss with provider.    STOP taking these medications     amLODipine (NORVASC) 5 MG tablet      losartan (COZAAR) 100 MG tablet      metoprolol succinate (TOPROL-XL) 25 MG 24 hr tablet          DISCHARGE INSTRUCTIONS:    Follow with PMD in 1-2 weeks. Follow with ID clinic in 2 weeks.  If you experience worsening of your admission symptoms, develop shortness of breath, life threatening emergency, suicidal or homicidal thoughts you must seek medical attention immediately by calling 911 or calling your MD immediately  if symptoms less severe.  You Must read complete instructions/literature along with all the possible adverse reactions/side effects for all the Medicines you take and that have been prescribed to you. Take any new Medicines after you have completely understood and accept all the possible adverse reactions/side effects.   Please note  You were cared for by a hospitalist during your hospital stay. If you have any questions about your discharge medications or the care you received while you were in the hospital after you are discharged, you can call the unit and asked to speak with the hospitalist on call if the hospitalist that took care of you is not available. Once you are discharged, your primary care physician will handle any further medical issues. Please note that NO REFILLS for any discharge medications will be authorized once you are discharged, as it is imperative that you return to your primary care physician (or establish a relationship with a primary care physician if you do not have one) for your aftercare needs so that they can reassess your need  for medications and monitor your lab values.    Today   CHIEF COMPLAINT:   Chief Complaint  Patient presents with  . Shortness of Breath    HISTORY OF PRESENT ILLNESS:  Ralene Gasparyan  is a 66 y.o. female with a known history of Coronary artery disease, severe aortic stenosis, diastolic congestive heart failure, diabetes type 2, essential hypertension, hyperlipidemia unspecified who is presenting to the hospital with complaining of shortness of breath since Thursday. She reports that she has had progressive worsening of her breathing. She is noted to have wheezing and dry cough. She has not had any fevers or chills. Denies any chest pain or palpitations.   VITAL SIGNS:  Blood pressure (!) 103/58, pulse 84, temperature 97.7 F (36.5 C), temperature source Oral, resp. rate (!) 22, height 5\' 5"  (1.651 m), weight (!) 140.6 kg (310 lb), SpO2 97 %.  I/O:   Intake/Output Summary (Last 24 hours) at 07/17/16 1152 Last data filed at 07/17/16 1147  Gross per 24 hour  Intake                0 ml  Output             3500 ml  Net            -3500 ml    PHYSICAL EXAMINATION:   GENERAL:  66 y.o.-year-old patient lying in the bed with no acute distress.  EYES: Pupils equal, round, reactive to light and accommodation. No scleral icterus. Extraocular muscles intact.  HEENT: Head atraumatic, normocephalic. Oropharynx and nasopharynx clear.  NECK:  Supple, no jugular venous distention. No thyroid enlargement, no tenderness.  LUNGS: Normal breath sounds bilaterally, no wheezing, some crepitation. No use of accessory muscles of respiration.  CARDIOVASCULAR: S1, S2 normal. No murmurs, rubs, or gallops.  ABDOMEN: Soft, nontender, nondistended. Bowel sounds present. No organomegaly or mass.  EXTREMITIES: b/l pedal edema, unaboots present, no cyanosis, or clubbing.  NEUROLOGIC: Cranial nerves II through XII are intact. Muscle strength 5/5 in all extremities. Sensation intact. Gait not checked.   PSYCHIATRIC: The patient is alert and oriented x 3.  SKIN: No obvious rash, lesion, or ulcer.   DATA REVIEW:   CBC  Recent Labs Lab 07/14/16 0717  WBC 5.9  HGB 10.7*  HCT 32.8*  PLT 169    Chemistries   Recent Labs Lab 07/13/16 1836 07/14/16 0717  07/17/16 0614  NA 137 138  < > 140  K 3.9 3.7  < > 3.7  CL 106 105  < > 101  CO2 21* 24  < > 32  GLUCOSE 183* 172*  < > 109*  BUN 26* 29*  < > 30*  CREATININE 1.15* 1.21*  < > 0.85  CALCIUM 8.5* 8.0*  < > 8.0*  MG  --  2.0  --   --   AST 29  --   --   --   ALT 17  --   --   --   ALKPHOS 55  --   --   --   BILITOT 1.0  --   --   --   < > = values in this interval not displayed.  Cardiac Enzymes  Recent Labs Lab 07/14/16 1325  TROPONINI 0.17*    Microbiology Results  Results for orders placed or performed during the hospital encounter of 07/13/16  CULTURE, BLOOD (ROUTINE X 2) w Reflex to ID Panel     Status: None (Preliminary result)   Collection Time: 07/13/16  7:27 PM  Result Value Ref Range Status   Specimen Description BLOOD LEFT HAND  Final   Special Requests   Final    BOTTLES DRAWN AEROBIC AND ANAEROBIC AER 8ML ANA 7ML   Culture NO GROWTH 4 DAYS  Final   Report Status PENDING  Incomplete  CULTURE, BLOOD (ROUTINE X 2) w Reflex to ID Panel     Status: None (Preliminary result)   Collection Time: 07/13/16  7:34 PM  Result Value Ref Range Status   Specimen Description BLOOD RIGHT HAND  Final   Special Requests   Final    BOTTLES DRAWN AEROBIC AND ANAEROBIC AER 7ML ANA 8ML   Culture NO GROWTH 4 DAYS  Final   Report Status PENDING  Incomplete  Culture, expectorated sputum-assessment     Status: None   Collection Time: 07/14/16  5:34 PM  Result Value Ref  Range Status   Specimen Description SPUTUM  Final   Special Requests Normal  Final   Sputum evaluation   Final    Sputum specimen not acceptable for testing.  Please recollect.   CALLED STEVEN IMHOFF AT 0932 ON 07/14/16 BY SNJ    Report Status  07/14/2016 FINAL  Final    RADIOLOGY:  No results found.  EKG:   Orders placed or performed during the hospital encounter of 07/13/16  . ED EKG  . ED EKG  . EKG 12-Lead  . EKG 12-Lead      Management plans discussed with the patient, family and they are in agreement.  CODE STATUS: Full code.    Code Status Orders        Start     Ordered   07/14/16 1120  Full code  Continuous     07/14/16 1119    Code Status History    Date Active Date Inactive Code Status Order ID Comments User Context   07/14/2016  1:05 AM 07/14/2016 11:19 AM DNR [id number]  Dustin Flock, MD Inpatient   07/13/2016  7:17 PM 07/14/2016  1:05 AM Full Code [id number]  Dustin Flock, MD ED   05/02/2016 12:21 PM 05/09/2016  9:23 PM DNR [id number]  Vilinda Boehringer, MD ED      TOTAL TIME TAKING CARE OF THIS PATIENT: 35 minutes.    Vaughan Basta M.D on 07/17/2016 at 11:52 AM  Between 7am to 6pm - Pager - [phone]  After 6pm go to [email or web address] - password [email or web address] [place] [facility]  Office  [phone]  CC: Primary care physician; Frazier Richards, MD   Note: This dictation was prepared with Dragon dictation along with smaller phrase technology. Any transcriptional errors that result from this process are unintentional.

## 2016-07-17 NOTE — Clinical Social Work Note (Signed)
CSW presented bed offers to patient and she chose [name].  CSW attempted to contact SNF, left a message awaiting for call back from SNF about when they can accept patient.  Jones Broom. Norval Morton, MSW, [place]  07/17/2016 6:23 PM

## 2016-07-17 NOTE — Care Management Important Message (Signed)
Important Message  Patient Details  Name: [name] MRN: [id number] Date of Birth: [birth year]   Medicare Important Message Given:  Yes    Beverly Sessions, RN 07/17/2016, 3:38 PM

## 2016-07-17 NOTE — NC FL2 (Signed)
[place] LEVEL OF CARE SCREENING TOOL     IDENTIFICATION  Patient Name: [name] Birthdate: 1951-05-17 Sex: female Admission Date (Current Location): 07/13/2016  [place] and [place] Number:  Selena Lesser [phone] [place] and Address:  [facility], [address], [place],  [address]      Provider Number: [id number]  Attending Physician Name and Address:  Vaughan Basta, MD  Relative Name and Phone Number:  Cleda Clarks Relative [phone]     Current Level of Care: Hospital Recommended Level of Care: [place] Prior Approval Number:    Date Approved/Denied:   PASRR Number: [id number]  Discharge Plan: SNF    Current Diagnoses: Patient Active Problem List   Diagnosis Date Noted  . Acute CHF (congestive heart failure) ([place]) 07/13/2016  . Acute respiratory failure with hypoxia and hypercapnia ([place]) 05/02/2016  . Pressure injury of skin 05/02/2016  . Lactic acidosis   . Severe aortic stenosis   . OSA on CPAP   . Morbid obesity ([place])   . Community acquired pneumonia     Orientation RESPIRATION BLADDER Height & Weight     Self, Time, Situation, Place  O2 (3L) Continent Weight: (!) 310 lb (140.6 kg) Height:  5\' 5"  (165.1 cm)  BEHAVIORAL SYMPTOMS/MOOD NEUROLOGICAL BOWEL NUTRITION STATUS      Continent Diet (Cardiac)  AMBULATORY STATUS COMMUNICATION OF NEEDS Skin   Limited Assist Verbally Normal                       Personal Care Assistance Level of Assistance  Bathing, Feeding, Dressing Bathing Assistance: Limited assistance Feeding assistance: Independent Dressing Assistance: Limited assistance     Functional Limitations Info  Sight, Hearing, Speech Sight Info: Adequate Hearing Info: Adequate Speech Info: Adequate    SPECIAL CARE FACTORS FREQUENCY  PT (By licensed PT)     PT Frequency: 5x a week              Contractures Contractures Info: Not present     Additional Factors Info  Code Status, Psychotropic, Insulin Sliding Scale, Isolation Precautions Code Status Info: Full Code Allergies Info: TETANUS TOXOIDS, LISINOPRIL, NIACIN, SITAGLIPTIN, SULFAMETHOXAZOLE-TRIMETHOPRIM  Psychotropic Info: ALPRAZolam (XANAX) tablet 0.25 mg Insulin Sliding Scale Info: insulin aspart (novoLOG) injection 0-9 Units Isolation Precautions Info: Droplet precautions for FLU     Current Medications (07/17/2016):  This is the current hospital active medication list Current Facility-Administered Medications  Medication Dose Route Frequency Provider Last Rate Last Dose  . 0.9 %  sodium chloride infusion  250 mL Intravenous PRN Dustin Flock, MD      . acetaminophen (TYLENOL) tablet 650 mg  650 mg Oral Q6H PRN Dustin Flock, MD   650 mg at 07/17/16 6195   Or  . acetaminophen (TYLENOL) suppository 650 mg  650 mg Rectal Q6H PRN Dustin Flock, MD      . ALPRAZolam Duanne Moron) tablet 0.25 mg  0.25 mg Oral Once Pavan Pyreddy, MD      . amLODipine (NORVASC) tablet 5 mg  5 mg Oral Daily Dustin Flock, MD   5 mg at 07/16/16 1041  . aspirin EC tablet 81 mg  81 mg Oral Daily Dustin Flock, MD   81 mg at 07/17/16 0932  . atorvastatin (LIPITOR) tablet 40 mg  40 mg Oral q1800 Dustin Flock, MD   40 mg at 07/16/16 1838  . cephALEXin (KEFLEX) capsule 500 mg  500 mg Oral BID Dustin Flock, MD   500 mg at  07/17/16 1016  . cloNIDine (CATAPRES) tablet 0.1 mg  0.1 mg Oral BID Dustin Flock, MD   0.1 mg at 07/16/16 1040  . docusate sodium (COLACE) capsule 100 mg  100 mg Oral BID Dustin Flock, MD   100 mg at 07/17/16 0932  . fenofibrate tablet 54 mg  54 mg Oral Daily Dustin Flock, MD   54 mg at 07/17/16 0932  . furosemide (LASIX) injection 40 mg  40 mg Intravenous BID Dustin Flock, MD   40 mg at 07/17/16 0933  . HYDROcodone-acetaminophen (NORCO/VICODIN) 5-325 MG per tablet 1-2 tablet  1-2 tablet Oral Q4H PRN Dustin Flock, MD   1 tablet at 07/16/16 1041  . insulin aspart  (novoLOG) injection 0-9 Units  0-9 Units Subcutaneous TID WC Vaughan Basta, MD   1 Units at 07/17/16 1207  . insulin glargine (LANTUS) injection 50 Units  50 Units Subcutaneous Daily Dustin Flock, MD   50 Units at 07/17/16 1016  . ipratropium-albuterol (DUONEB) 0.5-2.5 (3) MG/3ML nebulizer solution 3 mL  3 mL Nebulization Q6H Dustin Flock, MD   3 mL at 07/17/16 1332  . losartan (COZAAR) tablet 100 mg  100 mg Oral Daily Dustin Flock, MD   100 mg at 07/16/16 1040  . metoprolol succinate (TOPROL-XL) 24 hr tablet 25 mg  25 mg Oral Daily Dustin Flock, MD   25 mg at 07/16/16 1041  . nystatin (MYCOSTATIN/NYSTOP) topical powder   Topical QID Dustin Flock, MD      . ondansetron [facility]) tablet 4 mg  4 mg Oral Q6H PRN Dustin Flock, MD       Or  . ondansetron (ZOFRAN) injection 4 mg  4 mg Intravenous Q6H PRN Dustin Flock, MD      . oseltamivir (TAMIFLU) capsule 75 mg  75 mg Oral BID Vaughan Basta, MD   75 mg at 07/17/16 0932  . potassium chloride SA (K-DUR,KLOR-CON) CR tablet 40 mEq  40 mEq Oral Daily Vaughan Basta, MD   40 mEq at 07/17/16 0932  . sodium chloride flush (NS) 0.9 % injection 3 mL  3 mL Intravenous Q12H Dustin Flock, MD   3 mL at 07/16/16 2220  . sodium chloride flush (NS) 0.9 % injection 3 mL  3 mL Intravenous Q12H Dustin Flock, MD   3 mL at 07/16/16 2220  . sodium chloride flush (NS) 0.9 % injection 3 mL  3 mL Intravenous PRN Dustin Flock, MD         Discharge Medications: Please see discharge summary for a list of discharge medications.  Relevant Imaging Results:  Relevant Lab Results:   Additional Information SSN [id number]  Ross Ludwig, [place]

## 2016-07-17 NOTE — Progress Notes (Signed)
Patient BP low at initial vital check. BP medication held. BP low during morning vital check. MD notified. Acknowledged. No new orders placed.

## 2016-07-18 LAB — GLUCOSE, CAPILLARY
Glucose-Capillary: 124 mg/dL — ABNORMAL HIGH (ref 65–99)
Glucose-Capillary: 142 mg/dL — ABNORMAL HIGH (ref 65–99)
Glucose-Capillary: 205 mg/dL — ABNORMAL HIGH (ref 65–99)
Glucose-Capillary: 101 mg/dL — ABNORMAL HIGH (ref 65–99)

## 2016-07-18 LAB — CULTURE, BLOOD (ROUTINE X 2)
CULTURE: NO GROWTH
Culture: NO GROWTH

## 2016-07-18 LAB — PROCALCITONIN: Procalcitonin: <0.1 ng/mL

## 2016-07-18 MED ORDER — BISACODYL 10 MG RE SUPP
10.0000 mg | Freq: Every day | RECTAL | Status: DC
  Administered 2016-07-18: 10 mg via RECTAL
  Filled 2016-07-18: qty 1

## 2016-07-18 MED ORDER — BISACODYL 10 MG RE SUPP: 10.0000 mg | Freq: Every day | RECTAL | 0 refills | Status: DC | PRN

## 2016-07-18 NOTE — Progress Notes (Signed)
[facility] [place] NAME: [name]    MR#:  [id number]  DATE OF BIRTH:  29-Aug-1950  SUBJECTIVE:  CHIEF COMPLAINT:   Chief Complaint  Patient presents with  . Shortness of Breath   His multiple and repeated infections and cellulitis on both legs and has been been on treatment by infectious disease specialist on oral Keflex. Skin with shortness of breath and congestive heart failure. With IV Lasix feels little better.  Also have some concern of having pneumonia on chest x-ray and patient is short of breath with coughing.  Flu test reported positive.  Comfortable but very weak today.  REVIEW OF SYSTEMS:  CONSTITUTIONAL: No fever, fatigue or weakness.  EYES: No blurred or double vision.  EARS, NOSE, AND THROAT: No tinnitus or ear pain.  RESPIRATORY: Positive for cough, shortness of breath, wheezing , no hemoptysis.  CARDIOVASCULAR: No chest pain, orthopnea, edema.  GASTROINTESTINAL: No nausea, vomiting, diarrhea or abdominal pain.  GENITOURINARY: No dysuria, hematuria.  ENDOCRINE: No polyuria, nocturia,  HEMATOLOGY: No anemia, easy bruising or bleeding SKIN: No rash or lesion. MUSCULOSKELETAL: No joint pain or arthritis.   NEUROLOGIC: No tingling, numbness, weakness.  PSYCHIATRY: No anxiety or depression.   ROS  DRUG ALLERGIES:   Allergies  Allergen Reactions  . Tetanus Toxoids Swelling  . Lisinopril Rash  . Niacin Rash  . Sitagliptin Rash  . Sulfamethoxazole-Trimethoprim Rash    VITALS:  Blood pressure 107/66, pulse 85, temperature 98.3 F (36.8 C), temperature source Oral, resp. rate 18, height 5\' 5"  (1.651 m), weight (!) 140.6 kg (310 lb), SpO2 97 %.  PHYSICAL EXAMINATION:  GENERAL:  66 y.o.-year-old patient lying in the bed with no acute distress.  EYES: Pupils equal, round, reactive to light and accommodation. No scleral icterus. Extraocular muscles intact.  HEENT: Head atraumatic, normocephalic. Oropharynx and  nasopharynx clear.  NECK:  Supple, no jugular venous distention. No thyroid enlargement, no tenderness.  LUNGS: Normal breath sounds bilaterally, no wheezing, some crepitation. No use of accessory muscles of respiration.  CARDIOVASCULAR: S1, S2 normal. No murmurs, rubs, or gallops.  ABDOMEN: Soft, nontender, nondistended. Bowel sounds present. No organomegaly or mass.  EXTREMITIES: b/l pedal edema, unaboots present, no cyanosis, or clubbing.  NEUROLOGIC: Cranial nerves II through XII are intact. Muscle strength 4/5 in all extremities. Sensation intact. Gait not checked.  PSYCHIATRIC: The patient is alert and oriented x 3.  SKIN: No obvious rash, lesion, or ulcer.   Physical Exam LABORATORY PANEL:   CBC  Recent Labs Lab 07/14/16 0717  WBC 5.9  HGB 10.7*  HCT 32.8*  PLT 169   ------------------------------------------------------------------------------------------------------------------  Chemistries   Recent Labs Lab 07/13/16 1836 07/14/16 0717  07/17/16 0614  NA 137 138  < > 140  K 3.9 3.7  < > 3.7  CL 106 105  < > 101  CO2 21* 24  < > 32  GLUCOSE 183* 172*  < > 109*  BUN 26* 29*  < > 30*  CREATININE 1.15* 1.21*  < > 0.85  CALCIUM 8.5* 8.0*  < > 8.0*  MG  --  2.0  --   --   AST 29  --   --   --   ALT 17  --   --   --   ALKPHOS 55  --   --   --   BILITOT 1.0  --   --   --   < > = values in this interval  not displayed. ------------------------------------------------------------------------------------------------------------------  Cardiac Enzymes  Recent Labs Lab 07/14/16 0717 07/14/16 1325  TROPONINI 0.19* 0.17*   ------------------------------------------------------------------------------------------------------------------  RADIOLOGY:  No results found.  ASSESSMENT AND PLAN:   Active Problems:   Acute CHF (congestive heart failure) (HCC)  Patient is a 66 year old with known history of severe aortic stenosis presents with shortness of  breath  1. Acute respiratory failure, Influenza A Suspect multifactorial including acute diastolic CHF, pneumonia ( suspected on admission but ruled out) associated with bronchospasm  treat with IV Lasix- given foley for helping her as she is not able to get of the bed frequently.  on nebulizer therapy,   Azithromycin for atypical pneumonia, but stop now as procalcitonin is low.  She is already o oral keflex, called ID consult.  For influenza- on tamiflu ( Day 3).   Sent Sputum cx.  2. Diabetes type 2 Continued therapy with Lantus place on sliding scale  3. Essential hypertension continue amlodipine and clonidine and Cozaar  4. Hyperlipidemia unspecified continue therapy with atorvastatin TriCor  5. Group B Streptococcus bacteremia as per ID she is on Keflex which will continue  6. Coronary artery disease with elevated troponin we'll cycle enzymes I will place her on aspirin cardiology consult has been ordered HFP and elevated troponin   All the records are reviewed and case discussed with Care Management/Social Workerr. Management plans discussed with the patient, family and they are in agreement.  CODE STATUS: Full.  TOTAL TIME TAKING CARE OF THIS PATIENT: 35 minutes.   As per PT eval- need SNF, placement tried today , but denied due to Flu. [facility]  POSSIBLE D/C IN 2-3 DAYS, DEPENDING ON CLINICAL CONDITION.   Vaughan Basta M.D on 07/18/2016   Between 7am to 6pm - Pager - [phone]  After 6pm go to [email or web address] - password [email or web address] [place] [facility]  Office  [phone]  CC: Primary care physician; Frazier Richards, MD  Note: This dictation was prepared with Dragon dictation along with smaller phrase technology. Any transcriptional errors that result from this process are unintentional.

## 2016-07-18 NOTE — Clinical Social Work Note (Addendum)
CSW spoke to [facility] and they are able to accept patient today as long as she continues with the Tamiflu treatment.  CSW to continue to follow patient's progress throughout discharge planning.  Patient to be d/c'ed today to [facility]  Patient and family agreeable to plans will transport via ems RN to call report to [phone].   Jones Broom. [place], MSW, [place]  07/18/2016 10:47 AM

## 2016-07-18 NOTE — Discharge Instructions (Signed)
Heart Failure Clinic appointment on July 26, 2016 at 9:00am with [name], [place]. Please call [phone] to reschedule.   Una Boots on both legs.  Blood pressure medications stopped in hospital, as blood pressure was running low normal due to infection and diet control in hospital. Check Blood pressure daily, and if start rising- We may need to re-start her medications as advised by rehab physician.

## 2016-07-18 NOTE — Progress Notes (Signed)
Physical Therapy Treatment Patient Details Name: [name] MRN: [id number] DOB: 10/21/1950 Today's Date: 07/18/2016    History of Present Illness Patient is a 66 y.o. female admitted on 54 JAN with SOB and acute CHF. PMH includes CAD, severe aortic stenosis, diastolic CHF, DMII, essential HTN, and hyperlipidema. Patient has influenza A.     PT Comments    Pt agreeable to PT; reports pain (chronic) in Bilateral lower extremities and occasionally in chest with cough. Pt requires (and requests) cueing only for bed mobility. Sit to stand transfer requires increased time and several attempts to attain stand with Min A. Performs sit with good use of hands and control. Small sidesteps bed to chair with Min guard. Pt instructed in several seated exercises during which nursing assistant enters room for BS check/pt care. No further treatment. Pt now to be discharged to skilled nursing facility at the time of this documentation.   Follow Up Recommendations  SNF     Equipment Recommendations       Recommendations for Other Services       Precautions / Restrictions Precautions Precautions: Fall Restrictions Weight Bearing Restrictions: No    Mobility  Bed Mobility Overal bed mobility: Needs Assistance Bed Mobility: Supine to Sit     Supine to sit: Supervision (verbal cueing)     General bed mobility comments:  (Pt manages self with increased time, rail and sequence cues)  Transfers Overall transfer level: Needs assistance Equipment used: Rolling walker (2 wheeled) Transfers: Sit to/from Stand Sit to Stand: Min assist         General transfer comment: requires several attempts   Ambulation/Gait Ambulation/Gait assistance: Min guard Ambulation Distance (Feet): 3 Feet Assistive device: Rolling walker (2 wheeled) Gait Pattern/deviations: Step-to pattern;Trunk flexed;Wide base of support Gait velocity: reduced Gait velocity interpretation: <1.8 ft/sec, indicative of  risk for recurrent falls General Gait Details: short wide BOS side steps bed to chair   Stairs            Wheelchair Mobility    Modified Rankin (Stroke Patients Only)       Balance Overall balance assessment: Needs assistance Sitting-balance support: Feet supported;Bilateral upper extremity supported Sitting balance-Leahy Scale: Good     Standing balance support: Bilateral upper extremity supported Standing balance-Leahy Scale: Fair                      Cognition Arousal/Alertness: Awake/alert Behavior During Therapy: WFL for tasks assessed/performed Overall Cognitive Status: Within Functional Limits for tasks assessed                      Exercises General Exercises - Lower Extremity Long Arc Quad: AROM;Both;10 reps;Seated Hip Flexion/Marching: AROM;Both;10 reps;Seated Toe Raises: AROM;Both;10 reps;Seated Heel Raises: AROM;Both;10 reps;Seated    General Comments        Pertinent Vitals/Pain Pain Assessment: Faces Faces Pain Scale: Hurts little more Pain Location: BLEs,, occasional in chest Pain Intervention(s): Limited activity within patient's tolerance;Monitored during session    Home Living                      Prior Function            PT Goals (current goals can now be found in the care plan section) Progress towards PT goals: Progressing toward goals    Frequency    Min 2X/week      PT Plan Current plan remains appropriate    Co-evaluation  End of Session Equipment Utilized During Treatment: Gait belt;Oxygen Activity Tolerance: Patient tolerated treatment well;Patient limited by fatigue;Patient limited by pain Patient left: in chair;with chair alarm set;with nursing/sitter in room     Time: 1131-1151 PT Time Calculation (min) (ACUTE ONLY): 20 min  Charges:  $Therapeutic Activity: 8-22 mins                    G CodesLarae Grooms, PTA 07/18/2016, 12:06 PM

## 2016-07-18 NOTE — Progress Notes (Signed)
Report called to [facility]/ verbalized an understanding/ iv and tele removed/ foley removed- pt has voided/ BM today/ EMS called to transport

## 2016-07-18 NOTE — Discharge Summary (Signed)
[facility] NAME: [name]    MR#:  [id number]  DATE OF BIRTH:  29-Dec-1950  DATE OF ADMISSION:  07/13/2016 ADMITTING PHYSICIAN: Dustin Flock, MD  DATE OF DISCHARGE: 07/18/2016   PRIMARY CARE PHYSICIAN: Frazier Richards, MD    ADMISSION DIAGNOSIS:  SOB (shortness of breath) [R06.02]  DISCHARGE DIAGNOSIS:  Active Problems:   Acute CHF (congestive heart failure) (HCC)   cellulitis   Influenza A  SECONDARY DIAGNOSIS:   Past Medical History:  Diagnosis Date  . CAD (coronary artery disease)   . Diabetes mellitus without complication ([place])   . Diastolic CHF ([place])   . Hyperlipemia   . Hypertension   . OSA (obstructive sleep apnea)   . Severe aortic stenosis     HOSPITAL COURSE:   1. Acute respiratory failure, Influenza A Suspect multifactorial including acute diastolic CHF, pneumonia ( suspected on admission but ruled out) associated with bronchospasm  treat with IV Lasix  on nebulizer therapy,   Azithromycin for atypical pneumonia, but stop now as procalcitonin is low.  She is already o oral keflex, called ID consult.  For influenza- on tamiflu.   Sent Sputum cx.   She have appointment in heart failure clinic and I advised about fluid restriction.  2. Diabetes type 2 Continued therapy with Lantus place on sliding scale  3. Essential hypertension  Blood pressure medications stopped in hospital, as blood pressure was running low normal due to infection and diet control in hospital. Check Blood pressure daily, and if start rising- We may need to re-start her medications as advised by rehab physician.  4. Hyperlipidemia unspecified continue therapy with atorvastatin TriCor  5. Group B Streptococcus bacteremia as per ID she is on Keflex which will continue for 2 more weeks, then follow with ID clinic.  6. Coronary artery disease with elevated troponin we'll cycle enzymes I will place her on aspirin  cardiology consult has been ordered HFP and elevated troponin   DISCHARGE CONDITIONS:   Stable.  CONSULTS OBTAINED:  Treatment Team:  Yolonda Kida, MD Isaias Cowman, MD Leonel Ramsay, MD  DRUG ALLERGIES:   Allergies  Allergen Reactions  . Tetanus Toxoids Swelling  . Lisinopril Rash  . Niacin Rash  . Sitagliptin Rash  . Sulfamethoxazole-Trimethoprim Rash    DISCHARGE MEDICATIONS:   Current Discharge Medication List    START taking these medications   Details  bisacodyl (DULCOLAX) 10 MG suppository Place 1 suppository (10 mg total) rectally daily as needed for moderate constipation. Qty: 12 suppository, Refills: 0    ipratropium-albuterol (DUONEB) 0.5-2.5 (3) MG/3ML SOLN Take 3 mLs by nebulization every 6 (six) hours. Qty: 360 mL, Refills: 0    oseltamivir (TAMIFLU) 75 MG capsule Take 1 capsule (75 mg total) by mouth 2 (two) times daily. Qty: 4 capsule, Refills: 0    potassium chloride SA (K-DUR,KLOR-CON) 10 MEQ tablet Take 1 tablet (10 mEq total) by mouth daily. Qty: 30 tablet, Refills: 0      CONTINUE these medications which have CHANGED   Details  !! cephALEXin (KEFLEX) 500 MG capsule Take 1 capsule (500 mg total) by mouth 2 (two) times daily. Qty: 28 capsule, Refills: 0    furosemide (LASIX) 40 MG tablet Take 1 tablet (40 mg total) by mouth daily. Qty: 30 tablet, Refills: 0     !! - Potential duplicate medications found. Please discuss with provider.    CONTINUE these medications which have NOT CHANGED  Details  aspirin EC 81 MG tablet Take 81 mg by mouth daily.    atorvastatin (LIPITOR) 40 MG tablet Take 1 tablet (40 mg total) by mouth daily at 6 PM. Qty: 30 tablet, Refills: 0    !! cephALEXin (KEFLEX) 500 MG capsule Take 500 mg by mouth 2 (two) times daily.    docusate sodium (COLACE) 100 MG capsule Take 1 capsule (100 mg total) by mouth 2 (two) times daily. Qty: 10 capsule, Refills: 0    fenofibrate (TRICOR) 145 MG tablet Take 1  tablet by mouth daily.    LANTUS SOLOSTAR 100 UNIT/ML Solostar Pen Inject 50 Units into the skin daily. Titrate for morning sugars less than 120    nystatin (MYCOSTATIN/NYSTOP) powder Apply topically 4 (four) times daily.     !! insulin aspart (NOVOLOG) 100 UNIT/ML injection Inject 0-20 Units into the skin 3 (three) times daily with meals. Qty: 10 mL, Refills: 11    !! insulin aspart (NOVOLOG) 100 UNIT/ML injection Inject 0-5 Units into the skin at bedtime. Qty: 10 mL, Refills: 11     !! - Potential duplicate medications found. Please discuss with provider.    STOP taking these medications     amLODipine (NORVASC) 5 MG tablet      cloNIDine (CATAPRES) 0.1 MG tablet      losartan (COZAAR) 100 MG tablet      metoprolol succinate (TOPROL-XL) 25 MG 24 hr tablet          DISCHARGE INSTRUCTIONS:    Follow with PMD in 1-2 weeks. Follow with ID clinic in 2 weeks.  If you experience worsening of your admission symptoms, develop shortness of breath, life threatening emergency, suicidal or homicidal thoughts you must seek medical attention immediately by calling 911 or calling your MD immediately  if symptoms less severe.  You Must read complete instructions/literature along with all the possible adverse reactions/side effects for all the Medicines you take and that have been prescribed to you. Take any new Medicines after you have completely understood and accept all the possible adverse reactions/side effects.   Please note  You were cared for by a hospitalist during your hospital stay. If you have any questions about your discharge medications or the care you received while you were in the hospital after you are discharged, you can call the unit and asked to speak with the hospitalist on call if the hospitalist that took care of you is not available. Once you are discharged, your primary care physician will handle any further medical issues. Please note that NO REFILLS for any  discharge medications will be authorized once you are discharged, as it is imperative that you return to your primary care physician (or establish a relationship with a primary care physician if you do not have one) for your aftercare needs so that they can reassess your need for medications and monitor your lab values.    Today   CHIEF COMPLAINT:   Chief Complaint  Patient presents with  . Shortness of Breath    HISTORY OF PRESENT ILLNESS:  Shantrice Rodenberg  is a 66 y.o. female with a known history of Coronary artery disease, severe aortic stenosis, diastolic congestive heart failure, diabetes type 2, essential hypertension, hyperlipidemia unspecified who is presenting to the hospital with complaining of shortness of breath since Thursday. She reports that she has had progressive worsening of her breathing. She is noted to have wheezing and dry cough. She has not had any fevers or chills. Denies any  chest pain or palpitations.   VITAL SIGNS:  Blood pressure 104/61, pulse 85, temperature 98.5 F (36.9 C), temperature source Oral, resp. rate 10, height 5\' 5"  (1.651 m), weight (!) 140.6 kg (310 lb), SpO2 96 %.  I/O:    Intake/Output Summary (Last 24 hours) at 07/18/16 1139 Last data filed at 07/18/16 0938  Gross per 24 hour  Intake                3 ml  Output             4515 ml  Net            -4512 ml    PHYSICAL EXAMINATION:   GENERAL:  66 y.o.-year-old patient lying in the bed with no acute distress.  EYES: Pupils equal, round, reactive to light and accommodation. No scleral icterus. Extraocular muscles intact.  HEENT: Head atraumatic, normocephalic. Oropharynx and nasopharynx clear.  NECK:  Supple, no jugular venous distention. No thyroid enlargement, no tenderness.  LUNGS: Normal breath sounds bilaterally, no wheezing, some crepitation. No use of accessory muscles of respiration.  CARDIOVASCULAR: S1, S2 normal. No murmurs, rubs, or gallops.  ABDOMEN: Soft, nontender,  nondistended. Bowel sounds present. No organomegaly or mass.  EXTREMITIES: b/l pedal edema, unaboots present, no cyanosis, or clubbing.  NEUROLOGIC: Cranial nerves II through XII are intact. Muscle strength 5/5 in all extremities. Sensation intact. Gait not checked.  PSYCHIATRIC: The patient is alert and oriented x 3.  SKIN: No obvious rash, lesion, or ulcer.   DATA REVIEW:   CBC  Recent Labs Lab 07/14/16 0717  WBC 5.9  HGB 10.7*  HCT 32.8*  PLT 169    Chemistries   Recent Labs Lab 07/13/16 1836 07/14/16 0717  07/17/16 0614  NA 137 138  < > 140  K 3.9 3.7  < > 3.7  CL 106 105  < > 101  CO2 21* 24  < > 32  GLUCOSE 183* 172*  < > 109*  BUN 26* 29*  < > 30*  CREATININE 1.15* 1.21*  < > 0.85  CALCIUM 8.5* 8.0*  < > 8.0*  MG  --  2.0  --   --   AST 29  --   --   --   ALT 17  --   --   --   ALKPHOS 55  --   --   --   BILITOT 1.0  --   --   --   < > = values in this interval not displayed.  Cardiac Enzymes  Recent Labs Lab 07/14/16 1325  TROPONINI 0.17*    Microbiology Results  Results for orders placed or performed during the hospital encounter of 07/13/16  CULTURE, BLOOD (ROUTINE X 2) w Reflex to ID Panel     Status: None   Collection Time: 07/13/16  7:27 PM  Result Value Ref Range Status   Specimen Description BLOOD LEFT HAND  Final   Special Requests   Final    BOTTLES DRAWN AEROBIC AND ANAEROBIC AER 8ML ANA 7ML   Culture NO GROWTH 5 DAYS  Final   Report Status 07/18/2016 FINAL  Final  CULTURE, BLOOD (ROUTINE X 2) w Reflex to ID Panel     Status: None   Collection Time: 07/13/16  7:34 PM  Result Value Ref Range Status   Specimen Description BLOOD RIGHT HAND  Final   Special Requests   Final    BOTTLES DRAWN AEROBIC AND ANAEROBIC AER 7ML ANA 8ML  Culture NO GROWTH 5 DAYS  Final   Report Status 07/18/2016 FINAL  Final  Culture, expectorated sputum-assessment     Status: None   Collection Time: 07/14/16  5:34 PM  Result Value Ref Range Status    Specimen Description SPUTUM  Final   Special Requests Normal  Final   Sputum evaluation   Final    Sputum specimen not acceptable for testing.  Please recollect.   CALLED STEVEN IMHOFF AT [phone] ON 07/14/16 BY SNJ    Report Status 07/14/2016 FINAL  Final    RADIOLOGY:  No results found.  EKG:   Orders placed or performed during the hospital encounter of 07/13/16  . ED EKG  . ED EKG  . EKG 12-Lead  . EKG 12-Lead      Management plans discussed with the patient, family and they are in agreement.  CODE STATUS: Full code.    Code Status Orders        Start     Ordered   07/14/16 1120  Full code  Continuous     07/14/16 1119    Code Status History    Date Active Date Inactive Code Status Order ID Comments User Context   07/14/2016  1:05 AM 07/14/2016 11:19 AM DNR [id number]  Dustin Flock, MD Inpatient   07/13/2016  7:17 PM 07/14/2016  1:05 AM Full Code [id number]  Dustin Flock, MD ED   05/02/2016 12:21 PM 05/09/2016  9:23 PM DNR [id number]  Vilinda Boehringer, MD ED      TOTAL TIME TAKING CARE OF THIS PATIENT: 35 minutes.    Vaughan Basta M.D on 07/18/2016 at 11:39 AM  Between 7am to 6pm - Pager - [phone]  After 6pm go to [email or web address] - password [email or web address] [facility]  Office  [phone]  CC: Primary care physician; Frazier Richards, MD   Note: This dictation was prepared with Dragon dictation along with smaller phrase technology. Any transcriptional errors that result from this process are unintentional.

## 2016-07-18 NOTE — Progress Notes (Signed)
Dr. Anselm Jungling paged to make aware of O2 needs/ 85% on RA at rest/ states he will order o2 at discharge/ pt must have BM before discharge per MD/ suppository given

## 2016-07-19 NOTE — Clinical Social Work Placement (Signed)
   CLINICAL SOCIAL WORK PLACEMENT  NOTE  Date:  07/19/2016  Patient Details  Name: [name] MRN: [id number] Date of Birth: 1951/05/19  Clinical Social Work is seeking post-discharge placement for this patient at the [place] level of care (*CSW will initial, date and re-position this form in  chart as items are completed):  Yes   Patient/family provided with [facility] Work Department's list of facilities offering this level of care within the geographic area requested by the patient (or if unable, by the patient's family).  Yes   Patient/family informed of their freedom to choose among providers that offer the needed level of care, that participate in Medicare, Medicaid or managed care program needed by the patient, have an available bed and are willing to accept the patient.  Yes   Patient/family informed of [facility]'s ownership interest in [facility] and [facility], as well as of the fact that they are under no obligation to receive care at these facilities.  PASRR submitted to EDS on 07/17/16     PASRR number received on       Existing PASRR number confirmed on 07/17/16     FL2 transmitted to all facilities in geographic area requested by pt/family on 07/17/16     FL2 transmitted to all facilities within larger geographic area on       Patient informed that his/her managed care company has contracts with or will negotiate with certain facilities, including the following:        Yes   Patient/family informed of bed offers received.  Patient chooses bed at [facility]     Physician recommends and patient chooses bed at      Patient to be transferred to [facility] on 07/18/16.  Patient to be transferred to facility by [facility]     Patient family notified on 07/18/16 of transfer.  Name of family member notified:  Patient notified her family friend [name]     PHYSICIAN Please sign FL2      Additional Comment:    _______________________________________________ Ross Ludwig, LCSWA 07/19/2016, 9:56 AM

## 2016-07-19 NOTE — Clinical Social Work Note (Signed)
Clinical Social Work Assessment  Patient Details  Name: [name] MRN: [id number] Date of Birth: December 06, 1950  Date of referral:  07/17/16               Reason for consult:  Facility Placement                Permission sought to share information with:  Family Supports Permission granted to share information::  Yes, Verbal Permission Granted  Name::     Capps,Julie Relative [phone]   Agency::  SNF admissions  Relationship::     Contact Information:     Housing/Transportation Living arrangements for the past 2 months:  Apartment Source of Information:  Patient Patient Interpreter Needed:  None Criminal Activity/Legal Involvement Pertinent to Current Situation/Hospitalization:  No - Comment as needed Significant Relationships:  Friend Lives with:  Self Do you feel safe going back to the place where you live?  No Need for family participation in patient care:  No (Coment)  Care giving concerns:  Patient feels she needs some short term rehab before she is able to return back home.   Social Worker assessment / plan:  Patient is a 66 year old single female who lives alone in an apartment, patient is alert and oriented x4 and able to express her feelings.  Patient states she has been to rehab in the past, and is aware of the process.  Patient states she was at [facility] and is willing to go back, but is open to other options if she does not have any other offers.  Patient was explained how insurance will pay for her stay and what to expect at SNF.  CSW explained process for looking for beds for patient and anticipation of when CSW will receive offers.  Patient gave CSW permission to begin bed search process, patient did not express any other concerns or issues.  Employment status:  Retired Forensic scientist:  Medicaid In [place], [place] PT Recommendations:  [place] / Referral to community resources:     Patient/Family's Response to care:  Patient in  agreement to going to SNF for short term rehab.  Patient/Family's Understanding of and Emotional Response to Diagnosis, Current Treatment, and Prognosis: Patient is aware of current treatment plan and diagnosis.  Emotional Assessment Appearance:  Appears stated age Attitude/Demeanor/Rapport:    Affect (typically observed):  Anxious, Appropriate, Pleasant Orientation:  Oriented to Self, Oriented to Place, Oriented to  Time, Oriented to Situation Alcohol / Substance use:  Not Applicable Psych involvement (Current and /or in the community):  No (Comment)  Discharge Needs  Concerns to be addressed:  Lack of Support Readmission within the last 30 days:  No Current discharge risk:  Lack of support system, Lives alone Barriers to Discharge:  No Barriers Identified   Anell Barr 07/19/2016, 9:52 AM

## 2016-07-26 ENCOUNTER — Encounter: Payer: Self-pay | Admitting: Family

## 2016-07-26 ENCOUNTER — Ambulatory Visit: Payer: No Typology Code available for payment source | Attending: Family | Admitting: Family

## 2016-07-26 VITALS — BP 118/70 | HR 94 | Resp 20 | Ht 66.0 in | Wt 290.0 lb

## 2016-07-26 DIAGNOSIS — I350 Nonrheumatic aortic (valve) stenosis: Secondary | ICD-10-CM | POA: Diagnosis not present

## 2016-07-26 DIAGNOSIS — G4733 Obstructive sleep apnea (adult) (pediatric): Secondary | ICD-10-CM | POA: Diagnosis not present

## 2016-07-26 DIAGNOSIS — Z794 Long term (current) use of insulin: Secondary | ICD-10-CM | POA: Insufficient documentation

## 2016-07-26 DIAGNOSIS — Z5189 Encounter for other specified aftercare: Secondary | ICD-10-CM | POA: Diagnosis present

## 2016-07-26 DIAGNOSIS — I5032 Chronic diastolic (congestive) heart failure: Secondary | ICD-10-CM | POA: Diagnosis not present

## 2016-07-26 DIAGNOSIS — I10 Essential (primary) hypertension: Secondary | ICD-10-CM

## 2016-07-26 DIAGNOSIS — Z7982 Long term (current) use of aspirin: Secondary | ICD-10-CM | POA: Insufficient documentation

## 2016-07-26 DIAGNOSIS — Z79899 Other long term (current) drug therapy: Secondary | ICD-10-CM | POA: Diagnosis not present

## 2016-07-26 DIAGNOSIS — E119 Type 2 diabetes mellitus without complications: Secondary | ICD-10-CM | POA: Insufficient documentation

## 2016-07-26 DIAGNOSIS — I110 Hypertensive heart disease with heart failure: Secondary | ICD-10-CM | POA: Diagnosis not present

## 2016-07-26 DIAGNOSIS — L03116 Cellulitis of left lower limb: Secondary | ICD-10-CM | POA: Diagnosis not present

## 2016-07-26 DIAGNOSIS — L03115 Cellulitis of right lower limb: Secondary | ICD-10-CM | POA: Diagnosis not present

## 2016-07-26 NOTE — Patient Instructions (Signed)
Continue weighing daily and call for an overnight weight gain of > 2 pounds or a weekly weight gain of >5 pounds. 

## 2016-07-26 NOTE — Progress Notes (Signed)
Patient ID: [name], female    DOB: Oct 14, 1950, 66 y.o.   MRN: [id number]  HPI   [name] is a 66 y/o female with a history of severe aortic stenosis, obstructive sleep apnea, MI, lymphedema, HTN, hyperlipidemia, DM, bacteremia and chronic heart failure.  Last echo was done 05/02/16 and showed an EF of 50-55% along with aortic stenosis and mild MR/TR. This is essentially unchanged from a previous echo done July 2016.  Admitted 07/13/16 with HF exacerbation and influenza A. Given IV diuretics, nebulizers and tamiflu. Cardiology consult obtained and discharged to a facility. Previously admitted on 05/02/16 with acute respiratory failure, MI and pneumonia. Received IV antibiotics, nebulizers and oxygen.   She presents today for her initial visit with fatigue and shortness of breath with minimal exertion. Denies any swelling in her legs and they are no longer wrapped. Does have some weakness and she's currently receiving physical therapy at the facility. Continues to wear oxygen at 2L.   Past Medical History:  Diagnosis Date  . Bacteremia   . CAD (coronary artery disease)   . Diabetes mellitus without complication ([place])   . Diastolic CHF ([place])   . Hyperlipemia   . Hypertension   . Lymphedema   . MI (myocardial infarction)   . OSA (obstructive sleep apnea)   . Severe aortic stenosis    No past surgical history on file.  Family History  Problem Relation Age of Onset  . Cancer Mother   . Heart failure Father    Social History  Substance Use Topics  . Smoking status: Never Smoker  . Smokeless tobacco: Never Used  . Alcohol use No   Allergies  Allergen Reactions  . Tetanus Toxoids Swelling  . Lisinopril Rash  . Niacin Rash  . Sitagliptin Rash  . Sulfamethoxazole-Trimethoprim Rash   Prior to Admission medications   Medication Sig Start Date End Date Taking? Authorizing Provider  aspirin EC 81 MG tablet Take 81 mg by mouth daily.   Yes Historical Provider, MD   atorvastatin (LIPITOR) 40 MG tablet Take 1 tablet (40 mg total) by mouth daily at 6 PM. 05/09/16  Yes Epifanio Lesches, MD  bisacodyl (DULCOLAX) 10 MG suppository Place 1 suppository (10 mg total) rectally daily as needed for moderate constipation. 07/18/16  Yes Vaughan Basta, MD  cephALEXin (KEFLEX) 500 MG capsule Take 1 capsule (500 mg total) by mouth 2 (two) times daily. 07/17/16 07/31/16 Yes Vaughan Basta, MD  docusate sodium (COLACE) 100 MG capsule Take 1 capsule (100 mg total) by mouth 2 (two) times daily. 05/09/16  Yes Epifanio Lesches, MD  fenofibrate (TRICOR) 145 MG tablet Take 1 tablet by mouth daily. 04/17/16  Yes Historical Provider, MD  furosemide (LASIX) 40 MG tablet Take 1 tablet (40 mg total) by mouth daily. 07/17/16  Yes Vaughan Basta, MD  insulin aspart (NOVOLOG) 100 UNIT/ML injection Inject 0-20 Units into the skin 3 (three) times daily with meals. 05/09/16  Yes Epifanio Lesches, MD  insulin aspart (NOVOLOG) 100 UNIT/ML injection Inject 0-5 Units into the skin at bedtime. 05/09/16  Yes Epifanio Lesches, MD  ipratropium-albuterol (DUONEB) 0.5-2.5 (3) MG/3ML SOLN Take 3 mLs by nebulization every 6 (six) hours. 07/17/16  Yes Vaughan Basta, MD  LANTUS SOLOSTAR 100 UNIT/ML Solostar Pen Inject 50 Units into the skin daily. Titrate for morning sugars less than 120 04/11/16  Yes Historical Provider, MD  nystatin (MYCOSTATIN/NYSTOP) powder Apply topically 4 (four) times daily.  04/18/16  Yes Historical Provider, MD  potassium chloride  SA (K-DUR,KLOR-CON) 10 MEQ tablet Take 1 tablet (10 mEq total) by mouth daily. 07/18/16  Yes Vaughan Basta, MD     Review of Systems  Constitutional: Positive for fatigue. Negative for appetite change.  HENT: Positive for rhinorrhea. Negative for congestion and sore throat.   Eyes: Negative.   Respiratory: Positive for cough, chest tightness (infrequent) and shortness of breath. Negative for wheezing.    Cardiovascular: Negative for chest pain, palpitations and leg swelling.  Gastrointestinal: Negative for abdominal distention and abdominal pain.  Endocrine: Negative.   Genitourinary: Negative.   Musculoskeletal: Negative for back pain and neck pain.  Skin: Negative.   Allergic/Immunologic: Negative.   Neurological: Positive for weakness (receiving PT at facility). Negative for dizziness and light-headedness.  Hematological: Negative for adenopathy. Does not bruise/bleed easily.  Psychiatric/Behavioral: Positive for sleep disturbance (chronic difficulty staying asleep; sleeping on 2 pillows ). Negative for suicidal ideas. The patient is not nervous/anxious.    Vitals:   07/26/16 0858  BP: 118/70  Pulse: 94  Resp: 20  SpO2: 99%  Weight: 290 lb (131.5 kg)  Height: 5\' 6"  (1.676 m)   Wt Readings from Last 3 Encounters:  07/26/16 290 lb (131.5 kg)  07/13/16 (!) 310 lb (140.6 kg)  05/09/16 299 lb 3.2 oz (135.7 kg)   Lab Results  Component Value Date   CREATININE 0.85 07/17/2016   CREATININE 0.96 07/16/2016   CREATININE 1.21 (H) 07/14/2016   Physical Exam  Constitutional: She is oriented to person, place, and time. She appears well-developed and well-nourished.  HENT:  Head: Normocephalic and atraumatic.  Eyes: Conjunctivae are normal. Pupils are equal, round, and reactive to light.  Neck: Normal range of motion. Neck supple. No JVD present.  Cardiovascular: Normal rate and regular rhythm.   Murmur heard.  Systolic murmur is present with a grade of 4/6  Pulmonary/Chest: Effort normal. She has no wheezes. She has no rales.  Abdominal: Soft. She exhibits no distension. There is no tenderness.  Musculoskeletal: She exhibits no edema or tenderness.  Neurological: She is alert and oriented to person, place, and time.  Skin: Skin is warm and dry.  Psychiatric: She has a normal mood and affect. Her behavior is normal. Thought content normal.  Nursing note and vitals  reviewed.   Assessment & Plan:  1: Chronic heart failure with preserved ejection fraction- - NYHA class III - euvolemic - being weighed daily. Discussed the importance of calling for an overnight weight gain of >2 pounds or a weekly weight gain of >5 pounds. She does have a scale at home. Weight down 10 pounds from hospital admission - not adding salt to her food. Reviewed the importance of closely following a 2000mg  sodium diet and how to read food labels once she returns home. She does admit that she doesn't cook so tends to eat a lot of salads, sandwiches and fast food like [employer]. - has received her flu and pneumonia vaccine for this season - receiving physical therapy at [facility] - saw her cardiologist Nehemiah Massed) on 07/20/16 and returns to him on 09/07/16  2: HTN- - BP looks good today - continue medications at this time - PCP is normally at [facility]  3: Severe aortic stenosis- - being followed closely by cardiology and isn't interested in repair at this time - has been on losartan in the past but no longer per facility [facility]  4: Obstructive sleep apnea- - does not wear CPAP as she says that she can't tolerate  it  5: Cellulitis of bilateral lower extremities- - reports legs being wrapped previously but no longer needed - continues on antibiotics - follows with infectious disease provider Ola Spurr) and returns back to him on 08/09/16  MAR from the facility reviewed.  Return here in 1 month or sooner for any questions/problems before then.  - does wear oxygen at 2L around the clock right now

## 2016-07-27 DIAGNOSIS — I10 Essential (primary) hypertension: Secondary | ICD-10-CM | POA: Insufficient documentation

## 2016-07-27 DIAGNOSIS — G4733 Obstructive sleep apnea (adult) (pediatric): Secondary | ICD-10-CM | POA: Insufficient documentation

## 2016-07-27 DIAGNOSIS — I872 Venous insufficiency (chronic) (peripheral): Secondary | ICD-10-CM | POA: Insufficient documentation

## 2016-07-27 DIAGNOSIS — L0390 Cellulitis, unspecified: Secondary | ICD-10-CM | POA: Insufficient documentation

## 2016-07-27 DIAGNOSIS — I5032 Chronic diastolic (congestive) heart failure: Secondary | ICD-10-CM | POA: Insufficient documentation

## 2016-08-22 ENCOUNTER — Ambulatory Visit: Payer: Medicare Other | Attending: Family | Admitting: Family

## 2016-08-22 ENCOUNTER — Encounter: Payer: Self-pay | Admitting: Family

## 2016-08-22 VITALS — BP 104/60 | HR 50 | Resp 20 | Ht 66.0 in | Wt 295.5 lb

## 2016-08-22 DIAGNOSIS — G4733 Obstructive sleep apnea (adult) (pediatric): Secondary | ICD-10-CM | POA: Diagnosis not present

## 2016-08-22 DIAGNOSIS — L03115 Cellulitis of right lower limb: Secondary | ICD-10-CM | POA: Insufficient documentation

## 2016-08-22 DIAGNOSIS — I350 Nonrheumatic aortic (valve) stenosis: Secondary | ICD-10-CM | POA: Diagnosis not present

## 2016-08-22 DIAGNOSIS — Z79899 Other long term (current) drug therapy: Secondary | ICD-10-CM | POA: Diagnosis not present

## 2016-08-22 DIAGNOSIS — I252 Old myocardial infarction: Secondary | ICD-10-CM | POA: Diagnosis not present

## 2016-08-22 DIAGNOSIS — Z794 Long term (current) use of insulin: Secondary | ICD-10-CM | POA: Insufficient documentation

## 2016-08-22 DIAGNOSIS — I5032 Chronic diastolic (congestive) heart failure: Secondary | ICD-10-CM | POA: Diagnosis not present

## 2016-08-22 DIAGNOSIS — Z7982 Long term (current) use of aspirin: Secondary | ICD-10-CM | POA: Insufficient documentation

## 2016-08-22 DIAGNOSIS — I110 Hypertensive heart disease with heart failure: Secondary | ICD-10-CM | POA: Insufficient documentation

## 2016-08-22 DIAGNOSIS — Z9989 Dependence on other enabling machines and devices: Secondary | ICD-10-CM

## 2016-08-22 DIAGNOSIS — Z5189 Encounter for other specified aftercare: Secondary | ICD-10-CM | POA: Insufficient documentation

## 2016-08-22 DIAGNOSIS — E119 Type 2 diabetes mellitus without complications: Secondary | ICD-10-CM | POA: Diagnosis not present

## 2016-08-22 DIAGNOSIS — L03116 Cellulitis of left lower limb: Secondary | ICD-10-CM | POA: Insufficient documentation

## 2016-08-22 DIAGNOSIS — Z882 Allergy status to sulfonamides status: Secondary | ICD-10-CM | POA: Insufficient documentation

## 2016-08-22 DIAGNOSIS — I10 Essential (primary) hypertension: Secondary | ICD-10-CM

## 2016-08-22 NOTE — Patient Instructions (Signed)
Continue weighing daily and call for an overnight weight gain of > 2 pounds or a weekly weight gain of >5 pounds. 

## 2016-08-22 NOTE — Progress Notes (Signed)
Patient ID: Miranda Miranda, female    DOB: Oct 09, 1950, 66 y.o.   MRN: [id number]  HPI  Miranda Miranda is a 66 y/o female with a history of severe aortic stenosis, obstructive sleep apnea, MI, lymphedema, HTN, hyperlipidemia, DM, bacteremia and chronic heart failure.  Last echo was done 05/02/16 and showed an EF of 50-55% along with aortic stenosis and mild MR/TR. This is essentially unchanged from a previous echo done July 2016.  Admitted 07/13/16 with HF exacerbation and influenza A. Given IV diuretics, nebulizers and tamiflu. Cardiology consult obtained and discharged to a facility. Previously admitted on 05/02/16 with acute respiratory failure, MI and pneumonia. Received IV antibiotics, nebulizers and oxygen.   She presents today for her follow-up visit with fatigue and shortness of breath with minimal exertion. Minimal swelling in her legs and they are no longer wrapped. Does now have [facility] coming in along with an aide 5 days/week.  Past Medical History:  Diagnosis Date  . Bacteremia   . CAD (coronary artery disease)   . Diabetes mellitus without complication ([place])   . Diastolic CHF ([place])   . Hyperlipemia   . Hypertension   . Lymphedema   . MI (myocardial infarction)   . OSA (obstructive sleep apnea)   . Severe aortic stenosis     No past surgical history on file.  Family History  Problem Relation Age of Onset  . Cancer Mother   . Heart failure Father    Social History  Substance Use Topics  . Smoking status: Never Smoker  . Smokeless tobacco: Never Used  . Alcohol use No   Allergies  Allergen Reactions  . Tetanus Toxoids Swelling  . Lisinopril Rash  . Niacin Rash  . Sitagliptin Rash  . Sulfamethoxazole-Trimethoprim Rash   Prior to Admission medications   Medication Sig Start Date End Date Taking? Authorizing Provider  aspirin EC 81 MG tablet Take 81 mg by mouth daily.   Yes Historical Provider, MD  atorvastatin (LIPITOR) 40 MG tablet Take 1 tablet  (40 mg total) by mouth daily at 6 PM. 05/09/16  Yes Epifanio Lesches, MD  ciprofloxacin (CIPRO) 500 MG tablet Take 500 mg by mouth 2 (two) times daily.   Yes Historical Provider, MD  docusate sodium (COLACE) 100 MG capsule Take 1 capsule (100 mg total) by mouth 2 (two) times daily. Patient taking differently: Take 100 mg by mouth daily.  05/09/16  Yes Epifanio Lesches, MD  fenofibrate (TRICOR) 145 MG tablet Take 1 tablet by mouth daily. 04/17/16  Yes Historical Provider, MD  furosemide (LASIX) 40 MG tablet Take 1 tablet (40 mg total) by mouth daily. 07/17/16  Yes Vaughan Basta, MD  insulin aspart (NOVOLOG) 100 UNIT/ML injection Inject 0-20 Units into the skin 3 (three) times daily with meals. 05/09/16  Yes Epifanio Lesches, MD  insulin aspart (NOVOLOG) 100 UNIT/ML injection Inject 0-5 Units into the skin at bedtime. 05/09/16  Yes Epifanio Lesches, MD  LANTUS SOLOSTAR 100 UNIT/ML Solostar Pen Inject 50 Units into the skin daily. Titrate for morning sugars less than 120 04/11/16  Yes Historical Provider, MD  nystatin (MYCOSTATIN/NYSTOP) powder Apply topically 4 (four) times daily.  04/18/16  Yes Historical Provider, MD  potassium chloride SA (K-DUR,KLOR-CON) 10 MEQ tablet Take 1 tablet (10 mEq total) by mouth daily. 07/18/16  Yes Vaughan Basta, MD    Review of Systems  Constitutional: Positive for fatigue. Negative for appetite change.  HENT: Positive for congestion. Negative for postnasal drip and sore throat.  Eyes: Negative.   Respiratory: Positive for shortness of breath. Negative for chest tightness.   Cardiovascular: Positive for leg swelling. Negative for chest pain and palpitations.  Gastrointestinal: Negative for abdominal distention and abdominal pain.  Endocrine: Negative.   Genitourinary: Negative.   Musculoskeletal: Negative for back pain and neck pain.  Skin: Negative.   Allergic/Immunologic: Negative.   Neurological: Positive for light-headedness.  Negative for dizziness.  Hematological: Negative for adenopathy. Does not bruise/bleed easily.  Psychiatric/Behavioral: Positive for sleep disturbance (sleep 2-3 hours at a time). Negative for dysphoric mood and suicidal ideas. The patient is nervous/anxious.    Vitals:   08/22/16 1059  BP: 104/60  Pulse: (!) 50  Resp: 20  SpO2: 97%  Weight: 295 lb 8 oz (134 kg)  Height: 5\' 6"  (1.676 m)   Wt Readings from Last 3 Encounters:  08/22/16 295 lb 8 oz (134 kg)  07/26/16 290 lb (131.5 kg)  07/13/16 (!) 310 lb (140.6 kg)   Lab Results  Component Value Date   CREATININE 0.85 07/17/2016   CREATININE 0.96 07/16/2016   CREATININE 1.21 (H) 07/14/2016    Physical Exam  Constitutional: She is oriented to person, place, and time. She appears well-developed and well-nourished.  HENT:  Head: Normocephalic and atraumatic.  Eyes: Conjunctivae are normal. Pupils are equal, round, and reactive to light.  Neck: Normal range of motion. Neck supple. No JVD present.  Cardiovascular: Regular rhythm.  Bradycardia present.   Pulmonary/Chest: Effort normal. She has no wheezes. She has no rales.  Abdominal: Soft. She exhibits no distension. There is no tenderness.  Musculoskeletal: She exhibits edema (trace edema in bilateral lower legs). She exhibits no tenderness.  Neurological: She is alert and oriented to person, place, and time.  Skin: Skin is warm and dry.  Very dry, scaly lower legs   Psychiatric: She has a normal mood and affect. Her behavior is normal.  Nursing note and vitals reviewed.    Assessment & Plan:  1: Chronic heart failure with preserved ejection fraction- - NYHA class III - euvolemic - not weighing daily as she says that her scales need batteries and she's going to get some today. Discussed the importance of calling for an overnight weight gain of >2 pounds or a weekly weight gain of >5 pounds. Weight up 5 pounds since she was last here - not adding salt to her food. Reviewed  the importance of closely following a 2000mg  sodium diet. She does admit that she doesn't cook so tends to eat a lot of salads, sandwiches and fast food like [employer]. - has received her flu and pneumonia vaccine for this season - now has [facility] coming - does have instruction that she can take an additional 20mg  furosemide as needed for edema, weight gain or worsening shortness of breath - saw her cardiologist Nehemiah Massed) on 07/20/16 and returns to him on 09/07/16  2: HTN- - BP looks good today - continue medications at this time - PCP is normally at [facility] and she is going there tomorrow  3: Severe aortic stenosis- - being followed closely by cardiology and isn't interested in repair at this time - has been on losartan in the past  4: Obstructive sleep apnea- - does not wear CPAP as she says that she can't tolerate it  5: Cellulitis of bilateral lower extremities- - reports legs being wrapped previously but no longer needed - continues on antibiotics - saw infectious disease provider Ola Spurr) on 08/09/16 and returns to him  09/06/16  Patient did not bring her medications nor a list. Each medication was verbally reviewed with the patient and she was encouraged to bring the bottles to every visit to confirm accuracy of list.  Return here in 1 month or sooner for any questions/problems before then.

## 2016-11-22 ENCOUNTER — Ambulatory Visit: Payer: Medicare Other | Admitting: Family

## 2016-12-05 ENCOUNTER — Encounter: Payer: Self-pay | Admitting: Family

## 2016-12-05 ENCOUNTER — Ambulatory Visit: Payer: Medicare Other | Attending: Family | Admitting: Family

## 2016-12-05 VITALS — HR 91 | Resp 20 | Ht 66.0 in | Wt 326.2 lb

## 2016-12-05 DIAGNOSIS — I350 Nonrheumatic aortic (valve) stenosis: Secondary | ICD-10-CM

## 2016-12-05 DIAGNOSIS — I110 Hypertensive heart disease with heart failure: Secondary | ICD-10-CM | POA: Insufficient documentation

## 2016-12-05 DIAGNOSIS — Z7982 Long term (current) use of aspirin: Secondary | ICD-10-CM | POA: Insufficient documentation

## 2016-12-05 DIAGNOSIS — G4733 Obstructive sleep apnea (adult) (pediatric): Secondary | ICD-10-CM | POA: Insufficient documentation

## 2016-12-05 DIAGNOSIS — Z79899 Other long term (current) drug therapy: Secondary | ICD-10-CM | POA: Diagnosis not present

## 2016-12-05 DIAGNOSIS — E785 Hyperlipidemia, unspecified: Secondary | ICD-10-CM | POA: Insufficient documentation

## 2016-12-05 DIAGNOSIS — Z882 Allergy status to sulfonamides status: Secondary | ICD-10-CM | POA: Diagnosis not present

## 2016-12-05 DIAGNOSIS — Z794 Long term (current) use of insulin: Secondary | ICD-10-CM | POA: Insufficient documentation

## 2016-12-05 DIAGNOSIS — I5032 Chronic diastolic (congestive) heart failure: Secondary | ICD-10-CM | POA: Diagnosis present

## 2016-12-05 DIAGNOSIS — I2510 Atherosclerotic heart disease of native coronary artery without angina pectoris: Secondary | ICD-10-CM | POA: Insufficient documentation

## 2016-12-05 DIAGNOSIS — I10 Essential (primary) hypertension: Secondary | ICD-10-CM

## 2016-12-05 DIAGNOSIS — E119 Type 2 diabetes mellitus without complications: Secondary | ICD-10-CM | POA: Insufficient documentation

## 2016-12-05 DIAGNOSIS — L03116 Cellulitis of left lower limb: Secondary | ICD-10-CM | POA: Diagnosis not present

## 2016-12-05 DIAGNOSIS — I252 Old myocardial infarction: Secondary | ICD-10-CM | POA: Insufficient documentation

## 2016-12-05 DIAGNOSIS — L03115 Cellulitis of right lower limb: Secondary | ICD-10-CM | POA: Insufficient documentation

## 2016-12-05 NOTE — Progress Notes (Signed)
Patient ID: [name], female    DOB: 1950/11/11, 66 y.o.   MRN: [id number]  HPI  [name] is a 66 y/o female with a history of severe aortic stenosis, obstructive sleep apnea, MI, lymphedema, HTN, hyperlipidemia, DM, bacteremia and chronic heart failure.  Last echo was done 05/02/16 and showed an EF of 50-55% along with aortic stenosis and mild MR/TR. This is essentially unchanged from a previous echo done July 2016.  Admitted 07/13/16 with HF exacerbation and influenza A. Given IV diuretics, nebulizers and tamiflu. Cardiology consult obtained and discharged to a facility. Previously admitted on 05/02/16 with acute respiratory failure, MI and pneumonia. Received IV antibiotics, nebulizers and oxygen.   She presents today for her follow-up visit with a chief complaint of moderate shortness of breath with minimal exertion. She describes this as chronic in nature and has been present for many years with varying levels of severity. She has associated fatigue, weight gain and light-headedness along with this.  Past Medical History:  Diagnosis Date  . Bacteremia   . CAD (coronary artery disease)   . Diabetes mellitus without complication ([place])   . Diastolic CHF ([place])   . Hyperlipemia   . Hypertension   . Lymphedema   . MI (myocardial infarction) ([place])   . OSA (obstructive sleep apnea)   . Severe aortic stenosis     No past surgical history on file.  Family History  Problem Relation Age of Onset  . Cancer Mother   . Heart failure Father    Social History  Substance Use Topics  . Smoking status: Never Smoker  . Smokeless tobacco: Never Used  . Alcohol use No   Allergies  Allergen Reactions  . Tetanus Toxoids Swelling  . Lisinopril Rash  . Niacin Rash  . Sitagliptin Rash  . Sulfamethoxazole-Trimethoprim Rash   Prior to Admission medications   Medication Sig Start Date End Date Taking? Authorizing Provider  aspirin EC 81 MG tablet Take 81 mg by mouth daily.   Yes  [provider]  atorvastatin (LIPITOR) 40 MG tablet Take 1 tablet (40 mg total) by mouth daily at 6 PM. 05/09/16  Yes Epifanio Lesches, MD  cephALEXin (KEFLEX) 500 MG capsule Take 500 mg by mouth daily.    Yes [provider]  docusate sodium (COLACE) 100 MG capsule Take 1 capsule (100 mg total) by mouth 2 (two) times daily. Patient taking differently: Take 100 mg by mouth daily.  05/09/16  Yes Epifanio Lesches, MD  fenofibrate (TRICOR) 145 MG tablet Take 1 tablet by mouth daily. 04/17/16  Yes [provider]  furosemide (LASIX) 40 MG tablet Take 1 tablet (40 mg total) by mouth daily. 07/17/16  Yes Vaughan Basta, MD  LANTUS SOLOSTAR 100 UNIT/ML Solostar Pen Inject 50 Units into the skin daily. Titrate for morning sugars less than 120 04/11/16  Yes [provider]  metFORMIN (GLUCOPHAGE) 1000 MG tablet Take 1,000 mg by mouth 2 (two) times daily with a meal.   Yes [provider]  nystatin (MYCOSTATIN/NYSTOP) powder Apply topically 4 (four) times daily.  04/18/16  Yes [provider]  potassium chloride SA (K-DUR,KLOR-CON) 10 MEQ tablet Take 1 tablet (10 mEq total) by mouth daily. 07/18/16  Yes Vaughan Basta, MD  triamcinolone cream (KENALOG) 0.1 % Apply 1 application topically daily. 11/29/16  Yes [provider]   Review of Systems  Constitutional: Positive for fatigue. Negative for appetite change.  HENT: Positive for congestion. Negative for postnasal drip and sore  throat.   Eyes: Negative.   Respiratory: Positive for shortness of breath. Negative for cough and chest tightness.   Cardiovascular: Negative for chest pain, palpitations and leg swelling.  Gastrointestinal: Positive for abdominal distention. Negative for abdominal pain.  Endocrine: Negative.   Genitourinary: Negative.   Musculoskeletal: Negative for back pain and neck pain.  Skin: Negative.   Allergic/Immunologic: Negative.   Neurological:  Positive for light-headedness. Negative for dizziness.  Hematological: Negative for adenopathy. Does not bruise/bleed easily.  Psychiatric/Behavioral: Positive for sleep disturbance (sleep 2-3 hours at a time). Negative for dysphoric mood and suicidal ideas. The patient is nervous/anxious.    Vitals:   12/05/16 1144  Pulse: 91  Resp: 20  SpO2: 95%  Weight: (!) 326 lb 4 oz (148 kg)  Height: 5\' 6"  (1.676 m)   Wt Readings from Last 3 Encounters:  12/05/16 (!) 326 lb 4 oz (148 kg)  08/22/16 295 lb 8 oz (134 kg)  07/26/16 290 lb (131.5 kg)   Lab Results  Component Value Date   CREATININE 0.85 07/17/2016   CREATININE 0.96 07/16/2016   CREATININE 1.21 (H) 07/14/2016    Physical Exam  Constitutional: She is oriented to person, place, and time. She appears well-developed and well-nourished.  HENT:  Head: Normocephalic and atraumatic.  Neck: Normal range of motion. Neck supple. No JVD present.  Cardiovascular: Normal rate and regular rhythm.   Pulmonary/Chest: Effort normal. She has no wheezes. She has no rales.  Abdominal: Soft. She exhibits no distension. There is no tenderness.  Musculoskeletal: She exhibits edema (trace edema in bilateral lower legs). She exhibits no tenderness.  Neurological: She is alert and oriented to person, place, and time.  Skin: Skin is warm and dry.  Very dry, scaly lower legs   Psychiatric: She has a normal mood and affect. Her behavior is normal.  Nursing note and vitals reviewed.    Assessment & Plan:  1: Chronic heart failure with preserved ejection fraction- - NYHA class III - euvolemic - weighing daily. Discussed the importance of calling for an overnight weight gain of >2 pounds or a weekly weight gain of >5 pounds. Weight up 31 pounds since she was last here - admits to not being very active; encouraged her to increase her activity even just a little bit - typically only takes her furosemide in the AM - not adding salt to her food. Reviewed  the importance of closely following a 2000mg  sodium diet. She does admit that she doesn't cook so tends to eat a lot of salads, sandwiches, [employer] and fast food like [employer]. - [facility] has finished - does have instruction that she can take an additional 20mg  furosemide as needed for edema, weight gain or worsening shortness of breath - saw her cardiologist Nehemiah Massed) on 11/15/16  2: HTN- - BP looks good today - continue medications at this time - PCP is normally at [facility] and was just there 11/30/16; labs were drawn there  3: Severe aortic stenosis- - being followed closely by cardiology and isn't interested in repair at this time - has been on losartan in the past  4: Obstructive sleep apnea- - does not wear CPAP as she says that she can't tolerate it  5: Cellulitis of bilateral lower extremities- - reports legs being wrapped previously but no longer needed - continues on antibiotics - saw infectious disease provider Ola Spurr) on 11/01/16  Patient did not bring her medications nor a list. Each medication was verbally reviewed with the  patient and she was encouraged to bring the bottles to every visit to confirm accuracy of list.  Return here in 3 months or sooner for any questions/problems before then.

## 2016-12-05 NOTE — Patient Instructions (Addendum)
Continue weighing daily and call for an overnight weight gain of > 2 pounds or a weekly weight gain of >5 pounds.  If you take an extra fluid pill, make sure you take an extra potassium pill.

## 2017-03-06 ENCOUNTER — Ambulatory Visit: Payer: Medicare Other | Admitting: Family

## 2017-03-23 ENCOUNTER — Ambulatory Visit: Payer: Medicare Other | Admitting: Family

## 2017-04-24 ENCOUNTER — Ambulatory Visit: Payer: Medicare Other | Admitting: Family

## 2017-05-18 ENCOUNTER — Encounter: Payer: Self-pay | Admitting: Emergency Medicine

## 2017-05-18 ENCOUNTER — Other Ambulatory Visit: Payer: Self-pay

## 2017-05-18 ENCOUNTER — Inpatient Hospital Stay
Admission: EM | Admit: 2017-05-18 | Discharge: 2017-05-22 | DRG: 307 | Disposition: A | Discharge status: Home / Self Care | Payer: Medicare Other | Attending: Internal Medicine | Admitting: Internal Medicine

## 2017-05-18 ENCOUNTER — Emergency Department: Payer: Medicare Other

## 2017-05-18 DIAGNOSIS — I350 Nonrheumatic aortic (valve) stenosis: Secondary | ICD-10-CM | POA: Diagnosis not present

## 2017-05-18 DIAGNOSIS — I110 Hypertensive heart disease with heart failure: Secondary | ICD-10-CM | POA: Diagnosis present

## 2017-05-18 DIAGNOSIS — Z79899 Other long term (current) drug therapy: Secondary | ICD-10-CM

## 2017-05-18 DIAGNOSIS — E119 Type 2 diabetes mellitus without complications: Secondary | ICD-10-CM | POA: Diagnosis present

## 2017-05-18 DIAGNOSIS — I5032 Chronic diastolic (congestive) heart failure: Secondary | ICD-10-CM | POA: Diagnosis present

## 2017-05-18 DIAGNOSIS — Z6841 Body Mass Index (BMI) 40.0 and over, adult: Secondary | ICD-10-CM

## 2017-05-18 DIAGNOSIS — Z794 Long term (current) use of insulin: Secondary | ICD-10-CM

## 2017-05-18 DIAGNOSIS — R55 Syncope and collapse: Secondary | ICD-10-CM | POA: Diagnosis present

## 2017-05-18 DIAGNOSIS — I252 Old myocardial infarction: Secondary | ICD-10-CM

## 2017-05-18 DIAGNOSIS — G4733 Obstructive sleep apnea (adult) (pediatric): Secondary | ICD-10-CM | POA: Diagnosis present

## 2017-05-18 DIAGNOSIS — Z888 Allergy status to other drugs, medicaments and biological substances status: Secondary | ICD-10-CM

## 2017-05-18 DIAGNOSIS — I2510 Atherosclerotic heart disease of native coronary artery without angina pectoris: Secondary | ICD-10-CM | POA: Diagnosis present

## 2017-05-18 DIAGNOSIS — Z7982 Long term (current) use of aspirin: Secondary | ICD-10-CM

## 2017-05-18 DIAGNOSIS — R002 Palpitations: Secondary | ICD-10-CM | POA: Diagnosis present

## 2017-05-18 DIAGNOSIS — Z887 Allergy status to serum and vaccine status: Secondary | ICD-10-CM

## 2017-05-18 DIAGNOSIS — E785 Hyperlipidemia, unspecified: Secondary | ICD-10-CM | POA: Diagnosis present

## 2017-05-18 LAB — TROPONIN I: Troponin I: 0.89 ng/mL (ref <0.03)

## 2017-05-18 LAB — CBC
HEMATOCRIT: 43.1 % (ref 35.0–47.0)
HEMOGLOBIN: 13.6 g/dL (ref 12.0–16.0)
MCH: 27.5 pg (ref 26.0–34.0)
MCHC: 31.6 g/dL — ABNORMAL LOW (ref 32.0–36.0)
MCV: 86.8 fL (ref 80.0–100.0)
Platelets: 196 10*3/uL (ref 150–440)
RBC: 4.96 MIL/uL (ref 3.80–5.20)
RDW: 16.6 % — AB (ref 11.5–14.5)
WBC: 10.5 10*3/uL (ref 3.6–11.0)

## 2017-05-18 LAB — BASIC METABOLIC PANEL
ANION GAP: 13 (ref 5–15)
BUN: 40 mg/dL — AB (ref 6–20)
CHLORIDE: 107 mmol/L (ref 101–111)
CO2: 19 mmol/L — ABNORMAL LOW (ref 22–32)
Calcium: 9 mg/dL (ref 8.9–10.3)
Creatinine, Ser: 1.53 mg/dL — ABNORMAL HIGH (ref 0.44–1.00)
GFR calc Af Amer: 40 mL/min — ABNORMAL LOW (ref >60)
GFR calc non Af Amer: 34 mL/min — ABNORMAL LOW (ref >60)
Glucose, Bld: 202 mg/dL — ABNORMAL HIGH (ref 65–99)
POTASSIUM: 4.7 mmol/L (ref 3.5–5.1)
SODIUM: 139 mmol/L (ref 135–145)

## 2017-05-18 LAB — BRAIN NATRIURETIC PEPTIDE: B NATRIURETIC PEPTIDE 5: 953 pg/mL — AB (ref 0.0–100.0)

## 2017-05-18 MED ORDER — SODIUM CHLORIDE 0.9 % IV BOLUS (SEPSIS)
500.0000 mL | Freq: Once | INTRAVENOUS | Status: AC
  Administered 2017-05-18: 500 mL via INTRAVENOUS

## 2017-05-18 NOTE — ED Notes (Signed)
ED Provider at bedside. 

## 2017-05-18 NOTE — ED Provider Notes (Signed)
[facility] Emergency Department Provider Note ____________________________________________   None    (approximate)  I have reviewed the triage vital signs and the nursing notes.   HISTORY  Chief Complaint Tachycardia    HPI [name] is a 66 y.o. female with past medical history as noted below, and specifically with history of high-grade aortic stenosis who presents with palpitations, acute onset several hours ago, exertional in nature, and associated with shortness of breath and near syncope.  Patient states that she has had the palpitations frequently in the past related to the aortic stenosis, but usually if she forces herself to yawn or rest for short time they resolve.  Patient states that today they persisted for several hours despite her attempts to stay in bed and not aggravate it.  Patient states that she has been followed by a cardiologist here, Dr. Nehemiah Massed, and was referred to [facility] for a TAVR but has not been able to arrange her first appointment there yet.  Past Medical History:  Diagnosis Date  . Bacteremia   . CAD (coronary artery disease)   . Diabetes mellitus without complication ([place])   . Diastolic CHF ([place])   . Hyperlipemia   . Hypertension   . Lymphedema   . MI (myocardial infarction) ([place])   . OSA (obstructive sleep apnea)   . Severe aortic stenosis     Patient Active Problem List   Diagnosis Date Noted  . Chronic diastolic heart failure ([place]) 07/27/2016  . HTN (hypertension) 07/27/2016  . Obstructive sleep apnea 07/27/2016  . Cellulitis 07/27/2016  . Pressure injury of skin 05/02/2016  . Lactic acidosis   . Severe aortic stenosis   . OSA on CPAP   . Morbid obesity ([place])   . Community acquired pneumonia     History reviewed. No pertinent surgical history.  Prior to Admission medications   Medication Sig Start Date End Date Taking? Authorizing Provider  aspirin EC 81 MG tablet Take 81 mg by mouth daily.   Yes  [provider]  atorvastatin (LIPITOR) 40 MG tablet Take 1 tablet (40 mg total) by mouth daily at 6 PM. 05/09/16  Yes Epifanio Lesches, MD  fenofibrate (TRICOR) 145 MG tablet Take 1 tablet by mouth daily. 04/17/16  Yes [provider]  LANTUS SOLOSTAR 100 UNIT/ML Solostar Pen Inject 50 Units into the skin daily. Titrate for morning sugars less than 120 04/11/16  Yes [provider]  metFORMIN (GLUCOPHAGE) 1000 MG tablet Take 1,000 mg by mouth 2 (two) times daily with a meal.   Yes [provider]  nystatin (MYCOSTATIN/NYSTOP) powder Apply topically 4 (four) times daily.  04/18/16  Yes [provider]  potassium chloride (K-DUR) 10 MEQ tablet Take 1 tablet by mouth daily. 03/14/17  Yes [provider]  triamcinolone cream (KENALOG) 0.1 % Apply 1 application topically daily. 11/29/16  Yes [provider]  furosemide (LASIX) 40 MG tablet Take 1 tablet (40 mg total) by mouth daily. 07/17/16   Vaughan Basta, MD    Allergies Tetanus toxoids; Lisinopril; Niacin; Sitagliptin; and Sulfamethoxazole-trimethoprim  Family History  Problem Relation Age of Onset  . Cancer Mother   . Heart failure Father     Social History Social History   Tobacco Use  . Smoking status: Never Smoker  . Smokeless tobacco: Never Used  Substance Use Topics  . Alcohol use: No  . Drug use: No    Review of Systems  Constitutional: No fever/chills. Eyes: No visual changes.  ENT: No sore throat. Cardiovascular: Denies chest pain.  Positive for palpitations. Respiratory: Positive for shortness of breath. Gastrointestinal: No nausea, no vomiting.  No diarrhea.  Genitourinary: Negative for dysuria.  Musculoskeletal: Negative for back pain. Skin: Negative for rash. Neurological: Negative for headaches, focal weakness or numbness.   ____________________________________________   PHYSICAL EXAM:  VITAL SIGNS: ED Triage Vitals  Enc Vitals  Group     BP 05/18/17 2213 116/75     Pulse Rate 05/18/17 2213 (!) 103     Resp 05/18/17 2213 (!) 24     Temp 05/18/17 2213 (!) 97.4 F (36.3 C)     Temp Source 05/18/17 2213 Oral     SpO2 05/18/17 2213 96 %     Weight 05/18/17 2220 (!) 330 lb (149.7 kg)     Height 05/18/17 2220 5\' 5"  (1.651 m)     Head Circumference --      Peak Flow --      Pain Score --      Pain Loc --      Pain Edu? --      Excl. in [place]? --     Constitutional: Alert and oriented. Slightly anxious appearing but in no acute distress. Eyes: Conjunctivae are normal.  Head: Atraumatic. Nose: No congestion/rhinnorhea. Mouth/Throat: Mucous membranes are moist.   Neck: Normal range of motion.  Cardiovascular: Borderline rate, regular rhythm. AS murmur.  Good peripheral circulation. Respiratory: Normal respiratory effort.  No retractions. Lungs CTAB. Gastrointestinal: Soft and nontender. No distention.  Genitourinary: No CVA tenderness. Musculoskeletal: No lower extremity edema.  Extremities warm and well perfused.  Neurologic:  Normal speech and language. No gross focal neurologic deficits are appreciated.  Skin:  Skin is warm and dry. No rash noted. Psychiatric: Mood and affect are normal. Speech and behavior are normal.  ____________________________________________   LABS (all labs ordered are listed, but only abnormal results are displayed)  Labs Reviewed  BASIC METABOLIC PANEL - Abnormal; Notable for the following components:      Result Value   CO2 19 (*)    Glucose, Bld 202 (*)    BUN 40 (*)    Creatinine, Ser 1.53 (*)    GFR calc non Af Amer 34 (*)    GFR calc Af Amer 40 (*)    All other components within normal limits  CBC - Abnormal; Notable for the following components:   MCHC 31.6 (*)    RDW 16.6 (*)    All other components within normal limits  TROPONIN I - Abnormal; Notable for the following components:   Troponin I 0.89 (*)    All other components within normal limits  BRAIN  NATRIURETIC PEPTIDE - Abnormal; Notable for the following components:   B Natriuretic Peptide 953.0 (*)    All other components within normal limits   ____________________________________________  EKG  ED ECG REPORT I, Arta Silence, the attending physician, personally viewed and interpreted this ECG.  Date: 05/18/2017 EKG Time: 2223 Rate: 100 Rhythm: Sinus tachycardia with PVCs QRS Axis: normal Intervals: LVH with intraventricular conduction delay ST/T Wave abnormalities: normal Narrative Interpretation: no evidence of acute ischemia; no significant change when compared to EKG of 07/13/2016  ____________________________________________  RADIOLOGY  CXR: Cardiomegaly with no other acute findings  ____________________________________________   PROCEDURES  Procedure(s) performed: No    Critical Care performed: No ____________________________________________   INITIAL IMPRESSION / ASSESSMENT AND PLAN / ED COURSE  Pertinent labs & imaging results that were available during my care of  the patient were reviewed by me and considered in my medical decision making (see chart for details).  66 year old female with past medical history as noted above and specifically with history of aortic stenosis presents with palpitations, difficulty breathing, and near syncope which is similar to but seemingly more severe than her typical left ear related symptoms.  Review of past medical records in Epic confirms the above history.  On exam, patient is relatively comfortable appearing at rest, her vital signs are normal except for borderline heart rate, and other than NAS murmur the remainder of her exam is relatively unremarkable.  She has no significant peripheral edema or signs of acute CHF.  Overall presentation most likely related to exacerbation of patient's aortic stenosis.  Lower suspicion for ACS, acute CHF, pneumonia, or infection.  Plan for chest x-ray, cardiac enzymes, basic  labs, and reassess.  I will also give a small fluid bolus to increase preload and hopefully improve patient's symptoms.    ----------------------------------------- 11:54 PM on 05/18/2017 -----------------------------------------  Patient's troponin is moderately elevated.  I consulted Dr. Ubaldo Glassing who is on-call for [facility] cardiology.  He agrees that this is likely due to the left ear, and does not recommend heparin drip or other acute intervention at this time.  He recommends admission for trending troponins, and further dispo recommendations per cardiology.  Patient signed out to the hospitalist.       ____________________________________________   FINAL CLINICAL IMPRESSION(S) / ED DIAGNOSES  Final diagnoses:  Aortic valve stenosis, etiology of cardiac valve disease unspecified      NEW MEDICATIONS STARTED DURING THIS VISIT:  This SmartLink is deprecated. Use AVSMEDLIST instead to display the medication list for a patient.   Note:  This document was prepared using Dragon voice recognition software and may include unintentional dictation errors.     Arta Silence, MD 05/18/17 2355

## 2017-05-18 NOTE — ED Triage Notes (Addendum)
Pt BIB [facility] from home for tachycardia. Pt has known hx tachycardia and is being followed by Dr Gigi Gin awaiting suspected correction of aortic valve stenosis. Pt deneis pain, denies SHOB, but is presenting with [facility] with exertion

## 2017-05-19 ENCOUNTER — Other Ambulatory Visit: Payer: Self-pay

## 2017-05-19 ENCOUNTER — Encounter: Payer: Self-pay | Admitting: Internal Medicine

## 2017-05-19 DIAGNOSIS — R55 Syncope and collapse: Secondary | ICD-10-CM | POA: Diagnosis present

## 2017-05-19 LAB — CBC
HCT: 41.5 % (ref 35.0–47.0)
Hemoglobin: 13.3 g/dL (ref 12.0–16.0)
MCH: 28 pg (ref 26.0–34.0)
MCHC: 32.2 g/dL (ref 32.0–36.0)
MCV: 87.2 fL (ref 80.0–100.0)
PLATELETS: 190 10*3/uL (ref 150–440)
RBC: 4.76 MIL/uL (ref 3.80–5.20)
RDW: 16 % — AB (ref 11.5–14.5)
WBC: 6.2 10*3/uL (ref 3.6–11.0)

## 2017-05-19 LAB — BASIC METABOLIC PANEL
Anion gap: 7 (ref 5–15)
BUN: 41 mg/dL — AB (ref 6–20)
CHLORIDE: 110 mmol/L (ref 101–111)
CO2: 22 mmol/L (ref 22–32)
CREATININE: 1.26 mg/dL — AB (ref 0.44–1.00)
Calcium: 9 mg/dL (ref 8.9–10.3)
GFR calc Af Amer: 50 mL/min — ABNORMAL LOW (ref >60)
GFR calc non Af Amer: 43 mL/min — ABNORMAL LOW (ref >60)
Glucose, Bld: 140 mg/dL — ABNORMAL HIGH (ref 65–99)
Potassium: 4.4 mmol/L (ref 3.5–5.1)
SODIUM: 139 mmol/L (ref 135–145)

## 2017-05-19 LAB — TROPONIN I
TROPONIN I: 2.24 ng/mL — AB (ref <0.03)
Troponin I: 1.65 ng/mL (ref <0.03)
Troponin I: 2.17 ng/mL (ref <0.03)

## 2017-05-19 LAB — GLUCOSE, CAPILLARY
GLUCOSE-CAPILLARY: 180 mg/dL — AB (ref 65–99)
Glucose-Capillary: 124 mg/dL — ABNORMAL HIGH (ref 65–99)
Glucose-Capillary: 153 mg/dL — ABNORMAL HIGH (ref 65–99)
Glucose-Capillary: 118 mg/dL — ABNORMAL HIGH (ref 65–99)
Glucose-Capillary: 140 mg/dL — ABNORMAL HIGH (ref 65–99)

## 2017-05-19 LAB — PROTIME-INR
INR: 1.3
PROTHROMBIN TIME: 16.1 s — AB (ref 11.4–15.2)

## 2017-05-19 LAB — APTT: APTT: 27 s (ref 24–36)

## 2017-05-19 LAB — HEPARIN LEVEL (UNFRACTIONATED)

## 2017-05-19 MED ORDER — ONDANSETRON HCL 4 MG/2ML IJ SOLN: 4.0000 mg | Freq: Four times a day (QID) | INTRAMUSCULAR | Status: DC | PRN

## 2017-05-19 MED ORDER — TRIAMCINOLONE ACETONIDE 0.1 % EX CREA
1.0000 "application " | TOPICAL_CREAM | Freq: Every day | CUTANEOUS | Status: DC
  Administered 2017-05-19 – 2017-05-22 (×4): 1 via TOPICAL
  Filled 2017-05-19: qty 15

## 2017-05-19 MED ORDER — INSULIN GLARGINE 100 UNIT/ML ~~LOC~~ SOLN
50.0000 [IU] | Freq: Every day | SUBCUTANEOUS | Status: DC
  Administered 2017-05-19 – 2017-05-22 (×4): 50 [IU] via SUBCUTANEOUS
  Filled 2017-05-19 (×4): qty 0.5

## 2017-05-19 MED ORDER — ACETAMINOPHEN 325 MG PO TABS
650.0000 mg | ORAL_TABLET | Freq: Four times a day (QID) | ORAL | Status: DC | PRN
  Administered 2017-05-19 – 2017-05-21 (×3): 650 mg via ORAL
  Filled 2017-05-19 (×3): qty 2

## 2017-05-19 MED ORDER — FUROSEMIDE 40 MG PO TABS
40.0000 mg | ORAL_TABLET | Freq: Every day | ORAL | Status: DC
  Administered 2017-05-19 – 2017-05-22 (×4): 40 mg via ORAL
  Filled 2017-05-19 (×4): qty 1

## 2017-05-19 MED ORDER — ACETAMINOPHEN 650 MG RE SUPP: 650.0000 mg | Freq: Four times a day (QID) | RECTAL | Status: DC | PRN

## 2017-05-19 MED ORDER — INSULIN ASPART 100 UNIT/ML ~~LOC~~ SOLN
0.0000 [IU] | Freq: Every day | SUBCUTANEOUS | Status: DC
Start: 2017-05-19 — End: 2017-05-22

## 2017-05-19 MED ORDER — ASPIRIN 81 MG PO CHEW: 81.0000 mg | CHEWABLE_TABLET | Freq: Once | ORAL | Status: DC

## 2017-05-19 MED ORDER — FENOFIBRATE 54 MG PO TABS
54.0000 mg | ORAL_TABLET | Freq: Every day | ORAL | Status: DC
  Administered 2017-05-19 – 2017-05-22 (×4): 54 mg via ORAL
  Filled 2017-05-19 (×4): qty 1

## 2017-05-19 MED ORDER — SODIUM CHLORIDE 0.9% FLUSH: 3.0000 mL | Freq: Two times a day (BID) | INTRAVENOUS | Status: DC

## 2017-05-19 MED ORDER — SENNOSIDES-DOCUSATE SODIUM 8.6-50 MG PO TABS: 1.0000 | ORAL_TABLET | Freq: Every evening | ORAL | Status: DC | PRN

## 2017-05-19 MED ORDER — SODIUM CHLORIDE 0.9% FLUSH
3.0000 mL | Freq: Two times a day (BID) | INTRAVENOUS | Status: DC
  Administered 2017-05-19 – 2017-05-21 (×5): 3 mL via INTRAVENOUS

## 2017-05-19 MED ORDER — SODIUM CHLORIDE 0.9% FLUSH: 3.0000 mL | INTRAVENOUS | Status: DC | PRN

## 2017-05-19 MED ORDER — INSULIN ASPART 100 UNIT/ML ~~LOC~~ SOLN
0.0000 [IU] | Freq: Three times a day (TID) | SUBCUTANEOUS | Status: DC
  Administered 2017-05-19: 3 [IU] via SUBCUTANEOUS
  Administered 2017-05-19: 2 [IU] via SUBCUTANEOUS
  Administered 2017-05-20 – 2017-05-21 (×3): 3 [IU] via SUBCUTANEOUS
  Filled 2017-05-19 (×5): qty 1

## 2017-05-19 MED ORDER — DOCUSATE SODIUM 100 MG PO CAPS
100.0000 mg | ORAL_CAPSULE | Freq: Two times a day (BID) | ORAL | Status: DC
  Administered 2017-05-19 – 2017-05-22 (×6): 100 mg via ORAL
  Filled 2017-05-19 (×6): qty 1

## 2017-05-19 MED ORDER — ATORVASTATIN CALCIUM 20 MG PO TABS
40.0000 mg | ORAL_TABLET | Freq: Every day | ORAL | Status: DC
  Administered 2017-05-19 – 2017-05-21 (×3): 40 mg via ORAL
  Filled 2017-05-19 (×3): qty 2

## 2017-05-19 MED ORDER — HEPARIN SODIUM (PORCINE) 5000 UNIT/ML IJ SOLN: 5000.0000 [IU] | Freq: Three times a day (TID) | INTRAMUSCULAR | Status: DC

## 2017-05-19 MED ORDER — SODIUM CHLORIDE 0.9 % IV SOLN: 250.0000 mL | INTRAVENOUS | Status: DC | PRN

## 2017-05-19 MED ORDER — ASPIRIN EC 81 MG PO TBEC
81.0000 mg | DELAYED_RELEASE_TABLET | Freq: Every day | ORAL | Status: DC
Start: 2017-05-19 — End: 2017-05-22
  Administered 2017-05-19 – 2017-05-22 (×4): 81 mg via ORAL
  Filled 2017-05-19 (×4): qty 1

## 2017-05-19 MED ORDER — ONDANSETRON HCL 4 MG PO TABS: 4.0000 mg | ORAL_TABLET | Freq: Four times a day (QID) | ORAL | Status: DC | PRN

## 2017-05-19 MED ORDER — HEPARIN (PORCINE) IN NACL 100-0.45 UNIT/ML-% IJ SOLN
1150.0000 [IU]/h | INTRAMUSCULAR | Status: DC
  Administered 2017-05-19: 1150 [IU]/h via INTRAVENOUS
  Filled 2017-05-19: qty 250

## 2017-05-19 MED ORDER — POTASSIUM CHLORIDE CRYS ER 10 MEQ PO TBCR
10.0000 meq | EXTENDED_RELEASE_TABLET | Freq: Every day | ORAL | Status: DC
  Administered 2017-05-19 – 2017-05-22 (×4): 10 meq via ORAL
  Filled 2017-05-19 (×8): qty 1

## 2017-05-19 MED ORDER — METOPROLOL TARTRATE 25 MG PO TABS
12.5000 mg | ORAL_TABLET | Freq: Two times a day (BID) | ORAL | Status: DC
  Administered 2017-05-19 – 2017-05-20 (×3): 12.5 mg via ORAL
  Filled 2017-05-19 (×3): qty 1

## 2017-05-19 NOTE — Plan of Care (Signed)
  Clinical Measurements: Ability to maintain clinical measurements within normal limits will improve 05/19/2017 1647 - Not Progressing by Daron Offer, RN Note G.F.R. = only 50 today. Will continue to monitor renal status. Wenda Low [facility]

## 2017-05-19 NOTE — Progress Notes (Signed)
ANTICOAGULATION CONSULT NOTE - Initial Consult  Pharmacy Consult for heparin Indication: chest pain/ACS  Allergies  Allergen Reactions  . Tetanus Toxoids Swelling  . Lisinopril Rash  . Niacin Rash  . Sitagliptin Rash  . Sulfamethoxazole-Trimethoprim Rash    Patient Measurements: Height: 5\' 5"  (165.1 cm) Weight: (!) 315 lb 11.2 oz (143.2 kg) IBW/kg (Calculated) : 57 Heparin Dosing Weight: 94.8 kg  Vital Signs: Temp: 97.7 F (36.5 C) (11/24 0415) Temp Source: Oral (11/24 0415) BP: 132/96 (11/24 0415) Pulse Rate: 86 (11/24 0415)  Labs: Recent Labs    05/18/17 2224 05/19/17 0156  HGB 13.6  --   HCT 43.1  --   PLT 196  --   CREATININE 1.53*  --   TROPONINI 0.89* 1.65*    Estimated Creatinine Clearance: 52.2 mL/min (A) (by C-G formula based on SCr of 1.53 mg/dL (H)).   Medical History: Past Medical History:  Diagnosis Date  . Bacteremia   . CAD (coronary artery disease)   . Diabetes mellitus without complication ([place])   . Diastolic CHF ([place])   . Hyperlipemia   . Hypertension   . Lymphedema   . MI (myocardial infarction) ([place])   . OSA (obstructive sleep apnea)   . Severe aortic stenosis     Medications:  Scheduled:  . aspirin  81 mg Oral Once  . aspirin EC  81 mg Oral Daily  . atorvastatin  40 mg Oral q1800  . fenofibrate  54 mg Oral Daily  . furosemide  40 mg Oral Daily  . insulin aspart  0-15 Units Subcutaneous TID WC  . insulin aspart  0-5 Units Subcutaneous QHS  . insulin glargine  50 Units Subcutaneous Daily  . potassium chloride  10 mEq Oral Daily  . sodium chloride flush  3 mL Intravenous Q12H  . triamcinolone cream  1 application Topical Daily    Assessment: Patient admitted for tachycardia w/ trops up to 0.89>>1.65. Patient is being started on heparin.  Goal of Therapy:  Heparin level 0.3-0.7 units/ml Monitor platelets by anticoagulation protocol: Yes   Plan:  Will bolus w/ heparin 4000 units IV x 1 Will start heparin drip @ 1150  units/hr Will check anti-Xa @ 1300. Baseline labs drawn. Will monitor daily CBC's and adjust per anti-Xa levels.  Tobie Lords, PharmD, BCPS Clinical Pharmacist 05/19/2017

## 2017-05-19 NOTE — H&P (Addendum)
[facility] NAME: [name]    MR#:  [id number]  DATE OF BIRTH:  [birth year]  DATE OF ADMISSION:  05/18/2017  PRIMARY CARE PHYSICIAN: Frazier Richards, MD   REQUESTING/REFERRING PHYSICIAN:   CHIEF COMPLAINT:   Chief Complaint  Patient presents with  . Tachycardia    HISTORY OF PRESENT ILLNESS: [name]  is a 66 y.o. female with a known history of coronary artery disease, diabetes mellitus, diastolic heart failure, hyperlipidemia, hypertension, sleep apnea, severe aortic stenosis presented to the emergency room with palpitations. She also has some exertional shortness of breath for the last 2 days. She patient has palpation for the last few weeks. Patient also felt lightheaded and dizzy .She has severe aortic stenosis and has been referred to [facility] for TAVR procedure. Patient was evaluated in the emergency room her troponin was elevated. Case was discussed with on-call cardiology. Physician who said that elevated troponin could be secondary to severe aortic stenosis and did not recommend any intervention with heparin drip. Advised to trend troponins. Patient does not have any chest pain. Hospitalist service was consulted.  PAST MEDICAL HISTORY:   Past Medical History:  Diagnosis Date  . Bacteremia   . CAD (coronary artery disease)   . Diabetes mellitus without complication ([place])   . Diastolic CHF ([place])   . Hyperlipemia   . Hypertension   . Lymphedema   . MI (myocardial infarction) ([place])   . OSA (obstructive sleep apnea)   . Severe aortic stenosis     PAST SURGICAL HISTORY:  Past Surgical History:  Procedure Laterality Date  . none      SOCIAL HISTORY:  Social History   Tobacco Use  . Smoking status: Never Smoker  . Smokeless tobacco: Never Used  Substance Use Topics  . Alcohol use: No    FAMILY HISTORY:  Family History  Problem Relation Age of Onset  . Cancer Mother   . Heart failure  Father     DRUG ALLERGIES:  Allergies  Allergen Reactions  . Tetanus Toxoids Swelling  . Lisinopril Rash  . Niacin Rash  . Sitagliptin Rash  . Sulfamethoxazole-Trimethoprim Rash    REVIEW OF SYSTEMS:   CONSTITUTIONAL: No fever, fatigue or weakness.  EYES: No blurred or double vision.  EARS, NOSE, AND THROAT: No tinnitus or ear pain.  RESPIRATORY: No cough, has shortness of breath, no wheezing or hemoptysis.  CARDIOVASCULAR: No chest pain, orthopnea, edema. Has palpitations.  GASTROINTESTINAL: No nausea, vomiting, diarrhea or abdominal pain.  GENITOURINARY: No dysuria, hematuria.  ENDOCRINE: No polyuria, nocturia,  HEMATOLOGY: No anemia, easy bruising or bleeding SKIN: No rash or lesion. MUSCULOSKELETAL: No joint pain or arthritis.   NEUROLOGIC: No tingling, numbness, weakness.  Light headed ness present. PSYCHIATRY: No anxiety or depression.   MEDICATIONS AT HOME:  Prior to Admission medications   Medication Sig Start Date End Date Taking? Authorizing Provider  aspirin EC 81 MG tablet Take 81 mg by mouth daily.   Yes [provider]  atorvastatin (LIPITOR) 40 MG tablet Take 1 tablet (40 mg total) by mouth daily at 6 PM. 05/09/16  Yes Epifanio Lesches, MD  fenofibrate (TRICOR) 145 MG tablet Take 1 tablet by mouth daily. 04/17/16  Yes [provider]  LANTUS SOLOSTAR 100 UNIT/ML Solostar Pen Inject 50 Units into the skin daily. Titrate for morning sugars less than 120 04/11/16  Yes [provider]  metFORMIN (GLUCOPHAGE) 1000 MG tablet Take  1,000 mg by mouth 2 (two) times daily with a meal.   Yes [provider]  nystatin (MYCOSTATIN/NYSTOP) powder Apply topically 4 (four) times daily.  04/18/16  Yes [provider]  potassium chloride (K-DUR) 10 MEQ tablet Take 1 tablet by mouth daily. 03/14/17  Yes [provider]  triamcinolone cream (KENALOG) 0.1 % Apply 1 application topically daily. 11/29/16  Yes [provider]  furosemide (LASIX) 40 MG tablet Take 1 tablet (40 mg total) by mouth daily. 07/17/16   Vaughan Basta, MD      PHYSICAL EXAMINATION:   VITAL SIGNS: Blood pressure 114/85, pulse 95, temperature (!) 97.4 F (36.3 C), temperature source Oral, resp. rate 19, height 5\' 5"  (1.651 m), weight (!) 149.7 kg (330 lb), SpO2 95 %.  GENERAL:  66 y.o.-year-old obese patient lying in the bed with no acute distress.  EYES: Pupils equal, round, reactive to light and accommodation. No scleral icterus. Extraocular muscles intact.  HEENT: Head atraumatic, normocephalic. Oropharynx and nasopharynx clear.  NECK:  Supple, no jugular venous distention. No thyroid enlargement, no tenderness.  LUNGS: Normal breath sounds bilaterally, no wheezing, rales,rhonchi or crepitation. No use of accessory muscles of respiration.  CARDIOVASCULAR: S1, S2 normal. No murmurs, rubs, or gallops.  ABDOMEN: Soft, nontender, nondistended. Bowel sounds present. No organomegaly or mass.  EXTREMITIES: No pedal edema, cyanosis, or clubbing.  NEUROLOGIC: Cranial nerves II through XII are intact. Muscle strength 5/5 in all extremities. Sensation intact. Gait not checked.  PSYCHIATRIC: The patient is alert and oriented x 3.  SKIN: No obvious rash, lesion, or ulcer.   LABORATORY PANEL:   CBC Recent Labs  Lab 05/18/17 2224  WBC 10.5  HGB 13.6  HCT 43.1  PLT 196  MCV 86.8  MCH 27.5  MCHC 31.6*  RDW 16.6*   ------------------------------------------------------------------------------------------------------------------  Chemistries  Recent Labs  Lab 05/18/17 2224  NA 139  K 4.7  CL 107  CO2 19*  GLUCOSE 202*  BUN 40*  CREATININE 1.53*  CALCIUM 9.0   ------------------------------------------------------------------------------------------------------------------ estimated creatinine clearance is 53.7 mL/min (A) (by C-G formula based on SCr of 1.53 mg/dL  (H)). ------------------------------------------------------------------------------------------------------------------ No results for input(s): TSH, T4TOTAL, T3FREE, THYROIDAB in the last 72 hours.  Invalid input(s): FREET3   Coagulation profile No results for input(s): INR, PROTIME in the last 168 hours. ------------------------------------------------------------------------------------------------------------------- No results for input(s): DDIMER in the last 72 hours. -------------------------------------------------------------------------------------------------------------------  Cardiac Enzymes Recent Labs  Lab 05/18/17 2224  TROPONINI 0.89*   ------------------------------------------------------------------------------------------------------------------ Invalid input(s): POCBNP  ---------------------------------------------------------------------------------------------------------------  Urinalysis    Component Value Date/Time   COLORURINE YELLOW (A) 05/02/2016 0955   APPEARANCEUR CLOUDY (A) 05/02/2016 0955   LABSPEC 1.011 05/02/2016 0955   PHURINE 5.0 05/02/2016 0955   GLUCOSEU 150 (A) 05/02/2016 0955   HGBUR 2+ (A) 05/02/2016 0955   BILIRUBINUR NEGATIVE 05/02/2016 0955   KETONESUR NEGATIVE 05/02/2016 0955   PROTEINUR 100 (A) 05/02/2016 0955   NITRITE NEGATIVE 05/02/2016 0955   LEUKOCYTESUR 2+ (A) 05/02/2016 0955     RADIOLOGY: Dg Chest 2 View  Result Date: 05/18/2017 CLINICAL DATA:  Tachycardia and shortness of breath. Patient is being followed for aortic valve stenosis. History of coronary artery disease, diabetes, hypertension, nonsmoker EXAM: CHEST  2 VIEW COMPARISON:  07/13/2016 FINDINGS: Cardiac enlargement. No pulmonary vascular congestion or edema. No focal consolidation in the lungs. No blunting of costophrenic angles. No pneumothorax. IMPRESSION: Cardiac enlargement.  No evidence of active pulmonary disease. Electronically Signed   By: Oren Beckmann.D.  On: 05/18/2017 23:02    EKG: Orders placed or performed during the hospital encounter of 05/18/17  . ED EKG within 10 minutes  . ED EKG within 10 minutes  . EKG 12-Lead  . EKG 12-Lead  . EKG 12-Lead  . EKG 12-Lead    IMPRESSION AND PLAN: 66 year old female patient with history of severe aortic stenosis, hypertension, hyperlipidemia, diastolic heart failure, chronic lymphedema presented to the emergency room with shortness of breath, palpitations and lightheadedness.  Admitting diagnosis 1. Near syncope 2. Severe aortic stenosis 3. Hypertension 4. Hyperlipidemia 5. Diastolic heart failure Treatment plan Admit patient to telemetry Cycle troponin and check echocardiogram Cardiology evaluation Continue Lasix for diuresis DVT prophylaxis subcutaneous heparin Restart cardiac medications  All the records are reviewed and case discussed with ED provider. Management plans discussed with the patient, family and they are in agreement.  CODE STATUS:FULL CODE Code Status History    Date Active Date Inactive Code Status Order ID Comments User Context   07/14/2016 11:19 07/19/2016 00:28 Full Code [id number]  Vaughan Basta, MD Inpatient   07/14/2016 01:05 07/14/2016 11:19 DNR [id number]  Dustin Flock, MD Inpatient   07/13/2016 19:17 07/14/2016 01:05 Full Code [id number]  Dustin Flock, MD ED   05/02/2016 12:21 05/09/2016 21:23 DNR [id number]  Vilinda Boehringer, MD ED       TOTAL TIME TAKING CARE OF THIS PATIENT: 50 minutes.    Saundra Shelling M.D on 05/19/2017 at 12:23 AM  Between 7am to 6pm - Pager - [phone]  After 6pm go to [email or web address] - password [email or web address] [facility]  [place] [facility]  Office  [phone]  CC: Primary care physician; Frazier Richards, MD   Troponin second set also elevated Will start patient on heparin drip Continue aspirin, betablocker and statin Cardiology f/u

## 2017-05-19 NOTE — Progress Notes (Signed)
Same day note  Patient seen and examined.  Discussed the case with Dr. Ubaldo Glassing.  Obese patient admitted for palpitations.  Significant elevation in troponin. Started on heparin drip for possible non-ST elevation MI.  * Elevated troponin- thought to be due to demand ischemia with severe aortic stenosis per cardiology.  Discontinue Heparin drip.  Will need outpatient workup at [facility] for TAVR as outpatient.  Monitor overnight on telemetry.  Likely discharge tomorrow.

## 2017-05-19 NOTE — Consult Note (Signed)
[place]  CARDIOLOGY CONSULT NOTE  Patient ID: [name] MRN: [id number] DOB/AGE: [birth year] 66 y.o.  Admit date: 05/18/2017 Referring Physician Dr. Darvin Neighbours Primary Physician Dr. Sherril Cong Primary Cardiologist Dr. Nehemiah Massed Reason for Consultation Aortic stenosis/dizziness  HPI: Patient is a 66 year old female with known severe aortic stenosis, history of cardiomyopathy with an ejection fraction of 25-30% by echocardiogram 1 month ago, history of chronic lymphedema, history of diabetes mellitus, history of morbid obesity, history of struct of sleep apnea who was admitted with complaints of dizziness and shortness of breath.  Patient has had extensive outpatient evaluation including echocardiograms most recently 1 month ago revealing ejection fraction 25% with severe concentric left ventricular hypertrophy with aortic stenosis with a calculated aortic valve area of 0.49 cm with a peak gradient of 108.2 mmHg and a mean gradient of 69.3 mmHg, moderate MR, severe TR with estimated right ventricular systolic pressure of 83.1 mmHg.  On presentation her chest x-ray revealed cardiomegaly with no significant pulmonary edema.  Serum creatinine on presentation was 1.53 improved to 1.26 this morning.  Her GFR is 43.  Serum troponin has increased somewhat to 2.17.  EKG shows sinus tachycardia with interventricular conduction delay.  She has no chest pain but complains of shortness of breath and dyspnea with ambulation.  She also gets lightheaded when ambulating.  This is been fairly steady in the last several months.  She also complains of tachycardia on occasion.  Feels better today.  Discussion regarding T AVR has been carried out.  Patient has been hesitant to consider S AVR in the past and is somewhat hesitant to consider T AVR.  She has not been evaluated by T AVR team as of yet.  Plans were to have this carried out at [facility].  The patient would  be a high surgical risk.    Review of Systems  Constitutional: Positive for malaise/fatigue.  HENT: Negative.   Eyes: Negative.   Respiratory: Positive for shortness of breath.   Cardiovascular: Positive for palpitations and leg swelling.  Gastrointestinal: Negative.   Genitourinary: Negative.   Musculoskeletal: Negative.   Skin: Negative.   Neurological: Positive for dizziness and weakness.  Endo/Heme/Allergies: Negative.   Psychiatric/Behavioral: The patient is nervous/anxious.     Past Medical History:  Diagnosis Date  . Bacteremia   . CAD (coronary artery disease)   . Diabetes mellitus without complication ([place])   . Diastolic CHF ([place])   . Hyperlipemia   . Hypertension   . Lymphedema   . MI (myocardial infarction) ([place])   . OSA (obstructive sleep apnea)   . Severe aortic stenosis     Family History  Problem Relation Age of Onset  . Cancer Mother   . Heart failure Father     Social History   Socioeconomic History  . Marital status: Single    Spouse name: Not on file  . Number of children: Not on file  . Years of education: Not on file  . Highest education level: Not on file  Social Needs  . Financial resource strain: Not on file  . Food insecurity - worry: Not on file  . Food insecurity - inability: Not on file  . Transportation needs - medical: Not on file  . Transportation needs - non-medical: Not on file  Occupational History  . Not on file  Tobacco Use  . Smoking status: Never Smoker  . Smokeless tobacco: Never Used  Substance and Sexual Activity  .  Alcohol use: No  . Drug use: No  . Sexual activity: Not on file  Other Topics Concern  . Not on file  Social History Narrative  . Not on file    Past Surgical History:  Procedure Laterality Date  . none       Medications Prior to Admission  Medication Sig Dispense Refill Last Dose  . aspirin EC 81 MG tablet Take 81 mg by mouth daily.   Taking  . atorvastatin (LIPITOR) 40 MG tablet Take 1  tablet (40 mg total) by mouth daily at 6 PM. 30 tablet 0 Taking  . fenofibrate (TRICOR) 145 MG tablet Take 1 tablet by mouth daily.   Taking  . LANTUS SOLOSTAR 100 UNIT/ML Solostar Pen Inject 50 Units into the skin daily. Titrate for morning sugars less than 120   Taking  . metFORMIN (GLUCOPHAGE) 1000 MG tablet Take 1,000 mg by mouth 2 (two) times daily with a meal.   Taking  . nystatin (MYCOSTATIN/NYSTOP) powder Apply topically 4 (four) times daily.    Taking  . potassium chloride (K-DUR) 10 MEQ tablet Take 1 tablet by mouth daily.     Marland Kitchen triamcinolone cream (KENALOG) 0.1 % Apply 1 application topically daily.   Taking  . furosemide (LASIX) 40 MG tablet Take 1 tablet (40 mg total) by mouth daily. 30 tablet 0 Taking    Physical Exam: Blood pressure 127/79, pulse 87, temperature 97.8 F (36.6 C), temperature source Oral, resp. rate 18, height 5\' 5"  (1.651 m), weight (!) 143.2 kg (315 lb 11.2 oz), SpO2 97 %.   Wt Readings from Last 1 Encounters:  05/19/17 (!) 143.2 kg (315 lb 11.2 oz)     General appearance: alert, cooperative and morbidly obese Head: Normocephalic, without obvious abnormality, atraumatic Resp: rhonchi bibasilar Cardio: regular rate and rhythm and systolic murmur: late systolic 3/6, crescendo and decrescendo at 2nd left intercostal space, at lower left sternal border, at apex GI: soft, non-tender; bowel sounds normal; no masses,  no organomegaly Extremities: edema 4+ Neurologic: Grossly normal  Labs:   Lab Results  Component Value Date   WBC 6.2 05/19/2017   HGB 13.3 05/19/2017   HCT 41.5 05/19/2017   MCV 87.2 05/19/2017   PLT 190 05/19/2017    Recent Labs  Lab 05/19/17 0707  NA 139  K 4.4  CL 110  CO2 22  BUN 41*  CREATININE 1.26*  CALCIUM 9.0  GLUCOSE 140*   Lab Results  Component Value Date   TROPONINI 2.17 ([place]) 05/19/2017      Radiology: Cardiomegaly with no acute cardiopulmonary disease EKG: Sinus tachycardia with interventricular conduction  delay.  ASSESSMENT AND PLAN: 66 year old female with multiple medical problems complicated by morbid obesity.  She has critical aortic stenosis with a valve area 0.4-0.5 cm.  She has severe TR moderate MR with an estimated right ventricular systolic pressure of 70.  She was admitted with shortness of breath and lightheadedness.  This is been an ongoing problem.  It appears to have worsened somewhat recently.  She denies syncope.  She has dyspnea on exertion.  Chest x-ray revealed no pulmonary edema.  She is a poor surgical risk for her aortic valve.  She was being considered as an outpatient for consideration for T AVR.  She has an elevated serum troponin today however this is likely due to demand ischemia.  Patient also has a card myopathy with an EF of 25%.  Will add metoprolol to her regimen to help with rate control  and blood pressure control.  Would gently diurese.  If stable would consider outpatient further workup for T AVR.  Would not proceed with left heart catheterization unless she is a candidate for T AVR.  Will discontinue heparin at present as she does not appear to be having an acute coronary event.  Further recommendation will depend on course. Signed: Teodoro Spray MD, [facility] 05/19/2017, 9:37 AM

## 2017-05-20 LAB — GLUCOSE, CAPILLARY
GLUCOSE-CAPILLARY: 106 mg/dL — AB (ref 65–99)
GLUCOSE-CAPILLARY: 183 mg/dL — AB (ref 65–99)
Glucose-Capillary: 157 mg/dL — ABNORMAL HIGH (ref 65–99)
Glucose-Capillary: 125 mg/dL — ABNORMAL HIGH (ref 65–99)

## 2017-05-20 MED ORDER — ENOXAPARIN SODIUM 40 MG/0.4ML ~~LOC~~ SOLN
40.0000 mg | Freq: Two times a day (BID) | SUBCUTANEOUS | Status: DC
  Administered 2017-05-20 – 2017-05-22 (×4): 40 mg via SUBCUTANEOUS
  Filled 2017-05-20 (×4): qty 0.4

## 2017-05-20 MED ORDER — METOPROLOL TARTRATE 25 MG PO TABS
25.0000 mg | ORAL_TABLET | Freq: Two times a day (BID) | ORAL | Status: DC
  Administered 2017-05-20 – 2017-05-22 (×4): 25 mg via ORAL
  Filled 2017-05-20 (×4): qty 1

## 2017-05-20 MED ORDER — METOPROLOL TARTRATE 25 MG PO TABS: 12.5000 mg | ORAL_TABLET | Freq: Two times a day (BID) | ORAL | 0 refills | Status: DC

## 2017-05-20 NOTE — Progress Notes (Signed)
[place] [facility] [place] NAME: [name]    MR#:  [id number]  DATE OF BIRTH:  Jul 20, 1950  SUBJECTIVE:  CHIEF COMPLAINT:   Chief Complaint  Patient presents with  . Tachycardia  very SOB, dizzi, had run of SVT with HR in 170s per RN REVIEW OF SYSTEMS:  Review of Systems  Constitutional: Positive for malaise/fatigue. Negative for chills, fever and weight loss.  HENT: Negative for nosebleeds and sore throat.   Eyes: Negative for blurred vision.  Respiratory: Negative for cough, shortness of breath and wheezing.   Cardiovascular: Positive for palpitations. Negative for chest pain, orthopnea, leg swelling and PND.  Gastrointestinal: Negative for abdominal pain, constipation, diarrhea, heartburn, nausea and vomiting.  Genitourinary: Negative for dysuria and urgency.  Musculoskeletal: Negative for back pain.  Skin: Negative for rash.  Neurological: Positive for dizziness and weakness. Negative for speech change, focal weakness and headaches.  Endo/Heme/Allergies: Does not bruise/bleed easily.  Psychiatric/Behavioral: Negative for depression.   DRUG ALLERGIES:   Allergies  Allergen Reactions  . Tetanus Toxoids Swelling  . Lisinopril Rash  . Niacin Rash  . Sitagliptin Rash  . Sulfamethoxazole-Trimethoprim Rash   VITALS:  Blood pressure 134/90, pulse 76, temperature (!) 97.4 F (36.3 C), temperature source Oral, resp. rate 17, height 5\' 5"  (1.651 m), weight (!) 142 kg (313 lb 1.6 oz), SpO2 93 %. PHYSICAL EXAMINATION:  Physical Exam  Constitutional: She is oriented to person, place, and time and well-developed, well-nourished, and in no distress.  Morbidly obese  HENT:  Head: Normocephalic and atraumatic.  Eyes: Conjunctivae and EOM are normal. Pupils are equal, round, and reactive to light.  Neck: Normal range of motion. Neck supple. No tracheal deviation present. No thyromegaly present.  Cardiovascular: Normal rate, regular rhythm  and normal heart sounds.  Pulmonary/Chest: Effort normal and breath sounds normal. No respiratory distress. She has no wheezes. She exhibits no tenderness.  Abdominal: Soft. Bowel sounds are normal. She exhibits no distension. There is no tenderness.  Musculoskeletal: Normal range of motion. She exhibits edema.  Neurological: She is alert and oriented to person, place, and time. No cranial nerve deficit.  Skin: Skin is warm and dry. No rash noted.  Psychiatric: Mood and affect normal.   LABORATORY PANEL:  Female CBC Recent Labs  Lab 05/19/17 0707  WBC 6.2  HGB 13.3  HCT 41.5  PLT 190   ------------------------------------------------------------------------------------------------------------------ Chemistries  Recent Labs  Lab 05/19/17 0707  NA 139  K 4.4  CL 110  CO2 22  GLUCOSE 140*  BUN 41*  CREATININE 1.26*  CALCIUM 9.0   RADIOLOGY:  No results found. ASSESSMENT AND PLAN:  66 year old female patient with history of severe aortic stenosis, hypertension, hyperlipidemia, diastolic heart failure, chronic lymphedema admitted with shortness of breath, palpitations and lightheadedness.  * Near syncope: likely due to critical severe AS with reduced LV function.  * Severe aortic stenosis: EF 25-30% - Continue with aspirin 81 mg daily, metoprolol 25 mg twice daily (increased from 12.5 mg) and atorvastatin 40 mg daily.   - Limit activity until further Cardiac evaluation.  - Cardio to discuss with [facility] regarding further evaluation of her aortic valve disease.  may need T AVR as well as very high risk for S AVR.    * Elevated serum troponin-due to demand ischemia per cardio.   * Hypertension: continue metoprolol, Lasix.  * Hyperlipidemia: continue Lipitor  * Diastolic heart failure: lasix 40 mg daily  All the records are reviewed and case discussed with Care Management/Social Worker. Management plans discussed with the patient, Cardio, Nursing and  they are in agreement.  CODE STATUS: Full Code  TOTAL TIME TAKING CARE OF THIS PATIENT: 35 minutes.   More than 50% of the time was spent in counseling/coordination of care: YES  POSSIBLE D/C IN 1-2 DAYS, DEPENDING ON CLINICAL CONDITION.   Max Sane M.D on 05/20/2017 at 12:19 PM  Between 7am to 6pm - Pager - [phone]  After 6pm go to [email or web address] - [employer] [facility]  Office  [phone]  CC: Primary care physician; Frazier Richards, MD  Note: This dictation was prepared with Dragon dictation along with smaller phrase technology. Any transcriptional errors that result from this process are unintentional.

## 2017-05-20 NOTE — Progress Notes (Signed)
Pt. Had a burst of SVT (192) unsustained. Upon assessing pt. She was alert and oriented, no SOB and in no acute distress. Pt. Stated she was sleeping and felt the palpations in her chest, then she yawned and they stopped. MD aware, will continue to monitor pt.

## 2017-05-20 NOTE — Care Management Obs Status (Signed)
[place] NOTIFICATION   Patient Details  Name: [name] MRN: [id number] Date of Birth: January 17, 1951   Medicare Observation Status Notification Given:  Yes    Mamadou Breon A, RN 05/20/2017, 2:02 PM

## 2017-05-20 NOTE — Discharge Instructions (Signed)
Aortic Valve Stenosis Aortic valve stenosis is a narrowing of the aortic valve. The aortic valve opens and closes to regulate blood flow between the lower left chamber of the heart (left ventricle) and the blood vessel that leads away from the heart (aorta). When the aortic valve becomes narrow, it makes it difficult for the heart to pump blood into the aorta, which causes the heart to work harder. The extra work can weaken the heart over time. Aortic valve stenosis can range from mild to severe. If untreated, it can become more severe over time and can lead to heart failure. What are the causes? This condition may be caused by:  Buildup of calcium around and on the valve. This can occur with aging. This is the most common cause of aortic valve stenosis.  Birth defect.  Rheumatic fever.  Radiation to the chest. What increases the risk? You may be more likely to develop this condition if:  You are over the age of 65.  You were born with an abnormal bicuspid valve. What are the signs or symptoms? You may have no symptoms until your condition becomes severe. It may take 10-20 years for mild or moderate aortic valve stenosis to become severe. Symptoms may include:  Shortness of breath. This may get worse during physical activity.  Feeling unusually weak and tired (fatigue).  Extreme discomfort in the chest, neck, or arm (angina).  A heartbeat that is irregular or faster than normal (palpitations).  Dizziness or fainting. This may happen when you get physically tired or after you take certain heart medicines, such as nitroglycerin. How is this diagnosed? This condition may be diagnosed with:  A physical exam.  Echocardiogram. This is a type of imaging test that uses sound waves (ultrasound) to make an image of your heart. There are two types that may be used:  Transthoracic echocardiogram (TTE). This type of echocardiogram is noninvasive, and it is usually done  first.  Transesophageal echocardiogram (TEE). This type of echocardiogram is done by passing a flexible tube down your esophagus. The heart and the esophagus are close to each other, so your health care provider can take very clear, detailed pictures of the heart using this type of test.  Cardiac catheterization. In this procedure, a thin, flexible tube (catheter) is passed through a large vein in your neck, groin, or arm. This procedure provides information about arteries, structures, blood pressure, and oxygen levels in your heart.  Electrocardiogram (ECG). This records the electrical impulses of your heart and assesses heart function.  Stress tests. These are tests that evaluate the blood supply to your heart and your heart's response to exercise.  Blood tests. You may work with a health care provider who specializes in the heart (cardiologist). How is this treated? Treatment depends on how severe your condition is and what your symptoms are. You will need to have your heart checked regularly to make sure that your condition is not getting worse or causing serious problems. If your condition is mild, no treatment may be needed. Treatment may include:  Medicines that help keep your heart rate regular.  Medicines that thin your blood (anticoagulants) to prevent the formation of blood clots.  Antibiotic medicines to help prevent infection.  Surgery to replace your aortic valve. This is the most common treatment for aortic valve stenosis. Several types of surgeries are available. The surgery may be done through a large incision over your heart (open heart surgery), or it may be done using a minimally   invasive technique (transcatheter aortic valve replacement, or TAVR). Follow these instructions at home: Lifestyle    Limit alcohol intake to no more than 1 drink per day for nonpregnant women and 2 drinks per day for men. One drink equals 12 oz of beer, 5 oz of wine, or 1 oz of hard  liquor.  Do not use any tobacco products, such as cigarettes, chewing tobacco, or e-cigarettes. If you need help quitting, ask your health care provider.  Work with your health care provider to manage your blood pressure and cholesterol.  Maintain a healthy weight. Eating and drinking   Follow instructions from your health care provider about eating or drinking restrictions.  Limit how much caffeine you drink. Caffeine can affect your heart's rate and rhythm.  Drink enough fluid to keep your urine clear or pale yellow.  Eat a heart-healthy diet. This should include plenty of fresh fruits and vegetables. If you eat meat, it should be lean cuts. Avoid foods that are:  High in salt, saturated fat, or sugar.  Canned or highly processed.  Fried. Activity   Return to your normal activities as told by your health care provider. Ask your health care provider what activities are safe for you.  Exercise regularly, as told by your health care provider. Ask your health care provider what types of exercise are safe for you.  If your aortic valve stenosis is mild, you may need to avoid only very intense physical activity. The more severe your aortic valve stenosis is, the more activities you may need to avoid. General instructions   Take over-the-counter and prescription medicines only as told by your health care provider.  If you are a woman and you plan to become pregnant, talk with your health care provider before you become pregnant.  Tell all health care providers who care for you that you have aortic valve stenosis.  Keep all follow-up visits as told by your health care provider. This is important. Contact a health care provider if:  You have a fever. Get help right away if:  You develop chest pain or tightness.  You develop shortness of breath or difficulty breathing.  You feel light-headed.  You feel like you might faint.  Your heartbeat is irregular or faster than  normal. These symptoms may represent a serious problem that is an emergency. Do not wait to see if the symptoms will go away. Get medical help right away. Call your local emergency services (911 in the [place] Do not drive yourself to the hospital. This information is not intended to replace advice given to you by your health care provider. Make sure you discuss any questions you have with your health care provider. Document Released: 03/11/2003 Document Revised: 11/18/2015 Document Reviewed: 05/16/2015 Elsevier Interactive Patient Education  2017 Elsevier Inc.  

## 2017-05-20 NOTE — Plan of Care (Signed)
  Progressing Education: Knowledge of General Education information will improve 05/20/2017 1552 - Progressing by Feliberto Gottron, RN Health Behavior/Discharge Planning: Ability to manage health-related needs will improve 05/20/2017 1552 - Progressing by Feliberto Gottron, RN Clinical Measurements: Will remain free from infection 05/20/2017 1552 - Progressing by Chriss Czar, Illene Bolus, RN Coping: Level of anxiety will decrease 05/20/2017 1552 - Progressing by Feliberto Gottron, RN Pain Managment: General experience of comfort will improve 05/20/2017 1552 - Progressing by Chriss Czar, Illene Bolus, RN Safety: Ability to remain free from injury will improve 05/20/2017 1552 - Progressing by Chriss Czar, Illene Bolus, RN Skin Integrity: Risk for impaired skin integrity will decrease 05/20/2017 1552 - Progressing by Feliberto Gottron, RN

## 2017-05-20 NOTE — Care Management Note (Signed)
Case Management Note  Patient Details  Name: [name] MRN: [id number] Date of Birth: 06/18/1951  Subjective/Objective:      Not discharged yet per episode of tachycardia.               Action/Plan:   Expected Discharge Date:  05/20/17               Expected Discharge Plan:     In-House Referral:     Discharge planning Services     Post Acute Care Choice:    Choice offered to:     DME Arranged:    DME Agency:     HH Arranged:    HH Agency:     Status of Service:     If discussed at [employer], dates discussed:    Additional Comments:  Ioane Bhola A, RN 05/20/2017, 3:29 PM

## 2017-05-20 NOTE — Progress Notes (Addendum)
[place]  SUBJECTIVE: Still somewhat short of breath but improved.   Vitals:   05/19/17 1646 05/19/17 1927 05/20/17 0401 05/20/17 0741  BP: 127/87 (!) 137/91 (!) 135/97 134/90  Pulse: 83 82 76 76  Resp:  17 17   Temp: 97.6 F (36.4 C) 98 F (36.7 C) 98.1 F (36.7 C) (!) 97.4 F (36.3 C)  TempSrc: Oral Oral Oral Oral  SpO2: 93% 97% 96% 93%  Weight:   (!) 142 kg (313 lb 1.6 oz)   Height:        Intake/Output Summary (Last 24 hours) at 05/20/2017 4174 Last data filed at 05/19/2017 2245 Gross per 24 hour  Intake 480 ml  Output 900 ml  Net -420 ml    LABS: Basic Metabolic Panel: Recent Labs    05/18/17 2224 05/19/17 0707  NA 139 139  K 4.7 4.4  CL 107 110  CO2 19* 22  GLUCOSE 202* 140*  BUN 40* 41*  CREATININE 1.53* 1.26*  CALCIUM 9.0 9.0   Liver Function Tests: No results for input(s): AST, ALT, ALKPHOS, BILITOT, PROT, ALBUMIN in the last 72 hours. No results for input(s): LIPASE, AMYLASE in the last 72 hours. CBC: Recent Labs    05/18/17 2224 05/19/17 0707  WBC 10.5 6.2  HGB 13.6 13.3  HCT 43.1 41.5  MCV 86.8 87.2  PLT 196 190   Cardiac Enzymes: Recent Labs    05/19/17 0156 05/19/17 0707 05/19/17 1416  TROPONINI 1.65* 2.17* 2.24*   BNP: Invalid input(s): POCBNP D-Dimer: No results for input(s): DDIMER in the last 72 hours. Hemoglobin A1C: No results for input(s): HGBA1C in the last 72 hours. Fasting Lipid Panel: No results for input(s): CHOL, HDL, LDLCALC, TRIG, CHOLHDL, LDLDIRECT in the last 72 hours. Thyroid Function Tests: No results for input(s): TSH, T4TOTAL, T3FREE, THYROIDAB in the last 72 hours.  Invalid input(s): FREET3 Anemia Panel: No results for input(s): VITAMINB12, FOLATE, FERRITIN, TIBC, IRON, RETICCTPCT in the last 72 hours.   Physical Exam: Blood pressure 134/90, pulse 76, temperature (!) 97.4 F (36.3 C), temperature source Oral, resp. rate 17, height 5\' 5"  (1.651  m), weight (!) 142 kg (313 lb 1.6 oz), SpO2 93 %.   Wt Readings from Last 1 Encounters:  05/20/17 (!) 142 kg (313 lb 1.6 oz)     General appearance: alert and cooperative Resp: clear to auscultation bilaterally Cardio: regular rate and rhythm GI: soft, non-tender; bowel sounds normal; no masses,  no organomegaly Extremities: edema 3-4+ lower extremity edema Neurologic: Grossly normal  TELEMETRY: Reviewed telemetry pt in normal sinus rhythm  ASSESSMENT AND PLAN:  Active Problems:   Syncope, near-no further lightheaded episodes.  Patient appears back to her baseline.  She has critical aortic stenosis with reduced LV function.  Will need consideration for T AVR.  Would be extremely high risk S AVR candidate.  This has been considered at [facility].  Will discuss with the patient's primary cardiologist tomorrow as well as attempt to facilitate evaluation at [facility] as an outpatient for consideration for aortic valve intervention.  Would continue with aspirin 81 mg daily, metoprolol 12.5 mg twice daily and atorvastatin 40 mg daily.  Limit activity until further evaluation.  Again will discuss with [facility] regarding further evaluation of her aortic valve disease.  Okay for discharge from a cardiac standpoint  Aortic stenosis-critical aortic stenosis with an EF of 25-30%.  Patient has severe TR moderate  MR.  Somewhat high risk for T AVR as well as very high risk for S AVR.    Elevated serum troponin-appears to be demand ischemia.  Patient does not appear to have had acute coronary event.  Will need left heart cath prior to consideration for surgical intervention.  Would continue with aspirin and beta-blocker as mentioned above along with atorvastatin.  Teodoro Spray, MD, [facility] 05/20/2017 9:21 AM   Addendum: Pt had an episode of svt lasting several seconds at a rate of near 190. Broke spontaneously. Pt was symptomatic during this. Will increase metoprolol to 25 mg bid and  delay discharge for 24 hours.

## 2017-05-20 NOTE — Progress Notes (Signed)
CCMD called to notify patient in SVT heart rate in the 180s. Went into patients room, patient stated she felt her heart beating fast and felt hot with some diaphoresis. Gave patient a cold wash cloth as requested, about a minute later her pulse came back down to the 80s. Pt stated she felt better and felt her heart not racing as fast. Paged MD to notify, new orders received. Will continue to monitor patient.

## 2017-05-21 DIAGNOSIS — Z888 Allergy status to other drugs, medicaments and biological substances status: Secondary | ICD-10-CM | POA: Diagnosis not present

## 2017-05-21 DIAGNOSIS — R55 Syncope and collapse: Secondary | ICD-10-CM | POA: Diagnosis present

## 2017-05-21 DIAGNOSIS — I110 Hypertensive heart disease with heart failure: Secondary | ICD-10-CM | POA: Diagnosis present

## 2017-05-21 DIAGNOSIS — I2510 Atherosclerotic heart disease of native coronary artery without angina pectoris: Secondary | ICD-10-CM | POA: Diagnosis present

## 2017-05-21 DIAGNOSIS — E785 Hyperlipidemia, unspecified: Secondary | ICD-10-CM | POA: Diagnosis present

## 2017-05-21 DIAGNOSIS — Z79899 Other long term (current) drug therapy: Secondary | ICD-10-CM | POA: Diagnosis not present

## 2017-05-21 DIAGNOSIS — I252 Old myocardial infarction: Secondary | ICD-10-CM | POA: Diagnosis not present

## 2017-05-21 DIAGNOSIS — I350 Nonrheumatic aortic (valve) stenosis: Secondary | ICD-10-CM

## 2017-05-21 DIAGNOSIS — G4733 Obstructive sleep apnea (adult) (pediatric): Secondary | ICD-10-CM | POA: Diagnosis present

## 2017-05-21 DIAGNOSIS — Z794 Long term (current) use of insulin: Secondary | ICD-10-CM | POA: Diagnosis not present

## 2017-05-21 DIAGNOSIS — Z7982 Long term (current) use of aspirin: Secondary | ICD-10-CM | POA: Diagnosis not present

## 2017-05-21 DIAGNOSIS — I5032 Chronic diastolic (congestive) heart failure: Secondary | ICD-10-CM | POA: Diagnosis present

## 2017-05-21 DIAGNOSIS — E119 Type 2 diabetes mellitus without complications: Secondary | ICD-10-CM | POA: Diagnosis present

## 2017-05-21 DIAGNOSIS — Z887 Allergy status to serum and vaccine status: Secondary | ICD-10-CM | POA: Diagnosis not present

## 2017-05-21 DIAGNOSIS — R002 Palpitations: Secondary | ICD-10-CM | POA: Diagnosis present

## 2017-05-21 DIAGNOSIS — Z6841 Body Mass Index (BMI) 40.0 and over, adult: Secondary | ICD-10-CM | POA: Diagnosis not present

## 2017-05-21 LAB — CBC
HEMATOCRIT: 43.5 % (ref 35.0–47.0)
Hemoglobin: 14.2 g/dL (ref 12.0–16.0)
MCH: 28.1 pg (ref 26.0–34.0)
MCHC: 32.6 g/dL (ref 32.0–36.0)
MCV: 86.2 fL (ref 80.0–100.0)
PLATELETS: 192 10*3/uL (ref 150–440)
RBC: 5.05 MIL/uL (ref 3.80–5.20)
RDW: 16.3 % — AB (ref 11.5–14.5)
WBC: 8.8 10*3/uL (ref 3.6–11.0)

## 2017-05-21 LAB — GLUCOSE, CAPILLARY
GLUCOSE-CAPILLARY: 122 mg/dL — AB (ref 65–99)
GLUCOSE-CAPILLARY: 107 mg/dL — AB (ref 65–99)
GLUCOSE-CAPILLARY: 164 mg/dL — AB (ref 65–99)
Glucose-Capillary: 173 mg/dL — ABNORMAL HIGH (ref 65–99)
Glucose-Capillary: 140 mg/dL — ABNORMAL HIGH (ref 65–99)

## 2017-05-21 LAB — BASIC METABOLIC PANEL
ANION GAP: 9 (ref 5–15)
BUN: 51 mg/dL — AB (ref 6–20)
CALCIUM: 8.9 mg/dL (ref 8.9–10.3)
CO2: 23 mmol/L (ref 22–32)
CREATININE: 1.23 mg/dL — AB (ref 0.44–1.00)
Chloride: 106 mmol/L (ref 101–111)
GFR calc Af Amer: 52 mL/min — ABNORMAL LOW (ref >60)
GFR, EST NON AFRICAN AMERICAN: 45 mL/min — AB (ref >60)
GLUCOSE: 129 mg/dL — AB (ref 65–99)
Potassium: 4.1 mmol/L (ref 3.5–5.1)
Sodium: 138 mmol/L (ref 135–145)

## 2017-05-21 MED ORDER — SODIUM CHLORIDE 0.9% FLUSH
3.0000 mL | Freq: Two times a day (BID) | INTRAVENOUS | Status: DC
  Administered 2017-05-21 – 2017-05-22 (×2): 3 mL via INTRAVENOUS

## 2017-05-21 NOTE — Progress Notes (Signed)
       [facility]  SUBJECTIVE: dyspnaic   Vitals:   05/20/17 1553 05/20/17 2006 05/21/17 0549 05/21/17 0744  BP: 119/86 136/83 133/83 123/74  Pulse: 78 87 72 75  Resp:  (!) 27 18 18   Temp: 97.8 F (36.6 C) 97.6 F (36.4 C) 98 F (36.7 C) 98 F (36.7 C)  TempSrc: Oral   Oral  SpO2: 98% 97% 96% 94%  Weight:   (!) 141.2 kg (311 lb 3.2 oz)   Height:        Intake/Output Summary (Last 24 hours) at 05/21/2017 1232 Last data filed at 05/21/2017 1203 Gross per 24 hour  Intake 240 ml  Output 2750 ml  Net -2510 ml    LABS: Basic Metabolic Panel: Recent Labs    05/19/17 0707 05/21/17 0602  NA 139 138  K 4.4 4.1  CL 110 106  CO2 22 23  GLUCOSE 140* 129*  BUN 41* 51*  CREATININE 1.26* 1.23*  CALCIUM 9.0 8.9   Liver Function Tests: No results for input(s): AST, ALT, ALKPHOS, BILITOT, PROT, ALBUMIN in the last 72 hours. No results for input(s): LIPASE, AMYLASE in the last 72 hours. CBC: Recent Labs    05/19/17 0707 05/21/17 0602  WBC 6.2 8.8  HGB 13.3 14.2  HCT 41.5 43.5  MCV 87.2 86.2  PLT 190 192   Cardiac Enzymes: Recent Labs    05/19/17 0156 05/19/17 0707 05/19/17 1416  TROPONINI 1.65* 2.17* 2.24*   BNP: Invalid input(s): POCBNP D-Dimer: No results for input(s): DDIMER in the last 72 hours. Hemoglobin A1C: No results for input(s): HGBA1C in the last 72 hours. Fasting Lipid Panel: No results for input(s): CHOL, HDL, LDLCALC, TRIG, CHOLHDL, LDLDIRECT in the last 72 hours. Thyroid Function Tests: No results for input(s): TSH, T4TOTAL, T3FREE, THYROIDAB in the last 72 hours.  Invalid input(s): FREET3 Anemia Panel: No results for input(s): VITAMINB12, FOLATE, FERRITIN, TIBC, IRON, RETICCTPCT in the last 72 hours.   Physical Exam: Blood pressure 123/74, pulse 75, temperature 98 F (36.7 C), temperature source Oral, resp. rate 18, height 5\' 5"  (1.651 m), weight (!) 141.2 kg (311 lb 3.2 oz), SpO2 94 %.    Wt Readings from Last 1 Encounters:  05/21/17 (!) 141.2 kg (311 lb 3.2 oz)     General appearance: alert and cooperative Resp: clear to auscultation bilaterally Cardio: regular rate and rhythm GI: morbid obesity Extremities: edema 3+ edema Neurologic: Grossly normal  TELEMETRY: Reviewed telemetry pt in nsr:  ASSESSMENT AND PLAN:  Active Problems:   Syncope, near-may have been secondary to svt at home. Had a brief episode of svt at 190 which was resolved spontaneously after a few seconds. WIll continue with metoprolol 25 bid.    Aortic stenosis-severe AS. Has deferred surgical intervention in the past but states she may consider it at present. Hemodynamically stable at present. No chf by exam or cxr. Have called structural team at [facility]. Will continue with beta blocker for now.     Teodoro Spray, MD, [facility] 05/21/2017 12:32 PM

## 2017-05-21 NOTE — Care Management Note (Signed)
Case Management Note  Patient Details  Name: Mikaia Janvier MRN: [id number] Date of Birth: 02-Apr-1951  Subjective/Objective:                Patient placed in observation for syncope event which may have been a result of some SVT.  She has elevated troponins butr at present it is not stated as nstemi.  Patient has severe aortic stenosis and has been seen at [facility] for possible valve replacement. Cardiology is reaching out to [facility].  patient has had home health through [facility] in the past and receives personal care services through [facility] 2.5 hours a day.  No issues accessing medical care.  No home oxygen but does have cpap   Action/Plan:   Expected Discharge Date:  05/20/17               Expected Discharge Plan:     In-House Referral:     Discharge planning Services     Post Acute Care Choice:    Choice offered to:     DME Arranged:    DME Agency:     HH Arranged:    HH Agency:     Status of Service:     If discussed at [employer], dates discussed:    Additional Comments:  Katrina Stack, RN 05/21/2017, 1:44 PM

## 2017-05-21 NOTE — Progress Notes (Signed)
[place] at [place] NAME: [name]    MR#:  [id number]  DATE OF BIRTH:  [birth year]  SUBJECTIVE:  CHIEF COMPLAINT:   Chief Complaint  Patient presents with  . Tachycardia  very symptomatic with SOB, dizzi, had run of SVT with HR in 190s per RN REVIEW OF SYSTEMS:  Review of Systems  Constitutional: Positive for malaise/fatigue. Negative for chills, fever and weight loss.  HENT: Negative for nosebleeds and sore throat.   Eyes: Negative for blurred vision.  Respiratory: Negative for cough, shortness of breath and wheezing.   Cardiovascular: Positive for palpitations. Negative for chest pain, orthopnea, leg swelling and PND.  Gastrointestinal: Negative for abdominal pain, constipation, diarrhea, heartburn, nausea and vomiting.  Genitourinary: Negative for dysuria and urgency.  Musculoskeletal: Negative for back pain.  Skin: Negative for rash.  Neurological: Positive for dizziness and weakness. Negative for speech change, focal weakness and headaches.  Endo/Heme/Allergies: Does not bruise/bleed easily.  Psychiatric/Behavioral: Negative for depression.   DRUG ALLERGIES:   Allergies  Allergen Reactions  . Tetanus Toxoids Swelling  . Lisinopril Rash  . Niacin Rash  . Sitagliptin Rash  . Sulfamethoxazole-Trimethoprim Rash   VITALS:  Blood pressure 123/74, pulse 75, temperature 98 F (36.7 C), temperature source Oral, resp. rate 18, height 5\' 5"  (1.651 m), weight (!) 141.2 kg (311 lb 3.2 oz), SpO2 94 %. PHYSICAL EXAMINATION:  Physical Exam  Constitutional: She is oriented to person, place, and time and well-developed, well-nourished, and in no distress.  Morbidly obese  HENT:  Head: Normocephalic and atraumatic.  Eyes: Conjunctivae and EOM are normal. Pupils are equal, round, and reactive to light.  Neck: Normal range of motion. Neck supple. No tracheal deviation present. No thyromegaly present.  Cardiovascular: Normal rate,  regular rhythm and normal heart sounds.  Pulmonary/Chest: Effort normal and breath sounds normal. No respiratory distress. She has no wheezes. She exhibits no tenderness.  Abdominal: Soft. Bowel sounds are normal. She exhibits no distension. There is no tenderness.  Musculoskeletal: Normal range of motion. She exhibits edema.  Neurological: She is alert and oriented to person, place, and time. No cranial nerve deficit.  Skin: Skin is warm and dry. No rash noted.  Psychiatric: Mood and affect normal.   LABORATORY PANEL:  Female CBC Recent Labs  Lab 05/21/17 0602  WBC 8.8  HGB 14.2  HCT 43.5  PLT 192   ------------------------------------------------------------------------------------------------------------------ Chemistries  Recent Labs  Lab 05/21/17 0602  NA 138  K 4.1  CL 106  CO2 23  GLUCOSE 129*  BUN 51*  CREATININE 1.23*  CALCIUM 8.9   RADIOLOGY:  No results found. ASSESSMENT AND PLAN:  66 year old female patient with history of severe aortic stenosis, hypertension, hyperlipidemia, diastolic heart failure, chronic lymphedema admitted with shortness of breath, palpitations and lightheadedness.  * Near syncope: likely due to critical severe AS with reduced LV function. - gets into SVT (170-190s) easily on minimal exertion  * Severe aortic stenosis: EF 25-30% - Continue with aspirin 81 mg daily, metoprolol 25 mg twice daily (increased from 12.5 mg) and atorvastatin 40 mg daily.   - Limit activity until further Cardiac evaluation.  - Cardio to discuss with [facility] regarding further evaluation of her aortic valve disease.  may need T AVR as well as very high risk for S AVR.   - Repeat Echo  * Elevated serum troponin-due to demand ischemia per cardio.   * Hypertension: continue metoprolol, Lasix.  * Hyperlipidemia:  continue Lipitor  * Diastolic heart failure: continue lasix 40 mg daily        All the records are reviewed and case discussed with  Care Management/Social Worker. Management plans discussed with the patient, Cardio Dr Ubaldo Glassing, Nursing and they are in agreement.  CODE STATUS: Full Code  TOTAL TIME TAKING CARE OF THIS PATIENT: 35 minutes.   More than 50% of the time was spent in counseling/coordination of care: YES  POSSIBLE D/C IN 1-2 DAYS, DEPENDING ON CLINICAL CONDITION.   Max Sane M.D on 05/21/2017 at 4:22 PM  Between 7am to 6pm - Pager - [phone]  After 6pm go to [email or web address] - [employer] [facility]  Office  [phone]  CC: Primary care physician; Frazier Richards, MD  Note: This dictation was prepared with Dragon dictation along with smaller phrase technology. Any transcriptional errors that result from this process are unintentional.

## 2017-05-22 LAB — CBC
HEMATOCRIT: 42.2 % (ref 35.0–47.0)
Hemoglobin: 13.5 g/dL (ref 12.0–16.0)
MCH: 27.4 pg (ref 26.0–34.0)
MCHC: 32.1 g/dL (ref 32.0–36.0)
MCV: 85.5 fL (ref 80.0–100.0)
Platelets: 208 10*3/uL (ref 150–440)
RBC: 4.93 MIL/uL (ref 3.80–5.20)
RDW: 16.6 % — AB (ref 11.5–14.5)
WBC: 6.9 10*3/uL (ref 3.6–11.0)

## 2017-05-22 LAB — BASIC METABOLIC PANEL
Anion gap: 8 (ref 5–15)
BUN: 49 mg/dL — ABNORMAL HIGH (ref 6–20)
CO2: 24 mmol/L (ref 22–32)
Calcium: 8.8 mg/dL — ABNORMAL LOW (ref 8.9–10.3)
Chloride: 106 mmol/L (ref 101–111)
Creatinine, Ser: 1.16 mg/dL — ABNORMAL HIGH (ref 0.44–1.00)
GFR calc Af Amer: 56 mL/min — ABNORMAL LOW (ref >60)
GFR, EST NON AFRICAN AMERICAN: 48 mL/min — AB (ref >60)
GLUCOSE: 71 mg/dL (ref 65–99)
Potassium: 3.9 mmol/L (ref 3.5–5.1)
Sodium: 138 mmol/L (ref 135–145)

## 2017-05-22 LAB — GLUCOSE, CAPILLARY
GLUCOSE-CAPILLARY: 67 mg/dL (ref 65–99)
GLUCOSE-CAPILLARY: 64 mg/dL — AB (ref 65–99)
Glucose-Capillary: 65 mg/dL (ref 65–99)
Glucose-Capillary: 80 mg/dL (ref 65–99)
Glucose-Capillary: 110 mg/dL — ABNORMAL HIGH (ref 65–99)

## 2017-05-22 MED ORDER — METOPROLOL TARTRATE 25 MG PO TABS: 25.0000 mg | ORAL_TABLET | Freq: Two times a day (BID) | ORAL | 0 refills | Status: DC

## 2017-05-22 MED ORDER — METFORMIN HCL 1000 MG PO TABS: 1000.0000 mg | ORAL_TABLET | Freq: Every day | ORAL | Status: DC

## 2017-05-22 NOTE — Plan of Care (Signed)
  Adequate for Discharge Education: Knowledge of General Education information will improve 05/22/2017 1229 - Adequate for Discharge by Feliberto Gottron, RN Health Behavior/Discharge Planning: Ability to manage health-related needs will improve 05/22/2017 1229 - Adequate for Discharge by Feliberto Gottron, RN Clinical Measurements: Ability to maintain clinical measurements within normal limits will improve 05/22/2017 1229 - Adequate for Discharge by Chriss Czar, Illene Bolus, RN Will remain free from infection 05/22/2017 1229 - Adequate for Discharge by Feliberto Gottron, RN Diagnostic test results will improve 05/22/2017 1229 - Adequate for Discharge by Feliberto Gottron, RN Activity: Risk for activity intolerance will decrease 05/22/2017 1229 - Adequate for Discharge by Feliberto Gottron, RN Coping: Level of anxiety will decrease 05/22/2017 1229 - Adequate for Discharge by Feliberto Gottron, RN Elimination: Will not experience complications related to bowel motility 05/22/2017 1229 - Adequate for Discharge by Feliberto Gottron, RN Pain Managment: General experience of comfort will improve 05/22/2017 1229 - Adequate for Discharge by Feliberto Gottron, RN Safety: Ability to remain free from injury will improve 05/22/2017 1229 - Adequate for Discharge by Feliberto Gottron, RN Skin Integrity: Risk for impaired skin integrity will decrease 05/22/2017 1229 - Adequate for Discharge by Feliberto Gottron, RN

## 2017-05-22 NOTE — Progress Notes (Signed)
Pt transported via EMS. 

## 2017-05-22 NOTE — Care Management Important Message (Signed)
Important Message  Patient Details  Name: [name] MRN: [id number] Date of Birth: 1950-08-29   Medicare Important Message Given:  Yes Signed IM notice given    Katrina Stack, RN 05/22/2017, 2:43 PM

## 2017-05-22 NOTE — Progress Notes (Signed)
Rounded on patient.  Patient admitted with dx of near syncope likely due to critical severe AS with reduced LV function.  Patient has hx of HTN, HLD, and Diastolic Heart Failure.  Patient being discharged today.  Patient is followed in the [facility] and sees Dr. Nehemiah Massed in the [facility].  Patient is awaiting treatment at [facility] for her severe aortic stenosis.    Reviewed 5 steps to "Living Better with Heart Failure."  Patient informed this RN that she usually weighs herself every day or every other day.  Patient takes cardiac medications as prescribed and tries to follow a low sodium diet.  Patient is anxious to get this aortic stenosis repaired, as it is thought her overall condition - cardiac and SOB - will improve.    Patient for discharge today.  Appointment for follow-up in the [facility] scheduled for 05/30/2017 at 3:20 p.m.  Patient informed this RN she might not be able to keep this appointment nor see Dr. Nehemiah Massed if she is at [facility], as that is a priority for her right now.   Roanna Epley, RN, BSN, [facility] Cardiovascular and Pulmonary Nurse Navigator

## 2017-05-22 NOTE — Care Management Note (Addendum)
Case Management Note  Patient Details  Name: [name] MRN: [id number] Date of Birth: 1950/10/29  Subjective/Objective:                 Patient for discharge home.  Discussed the high potential for readmission due to heart failure issues.  Discussed she may qualify for HF titration protocol through [facility] and she would would like to try this agency and protocol.  She also asks if ems transport would be covered for her to return home because she would have to use her walker to get into the house and does not want to.  Informed her she does not meet medically necessity and  she still wishes to transport home and assume financial responsibility. Patient verbalizes she has a key to get into her home.   Action/Plan:    Referral to [facility] for SN PT OT SW and lasix protocol. Competed ems form. Updated primary nurse  Expected Discharge Date:  05/22/17               Expected Discharge Plan:     In-House Referral:     Discharge planning Services     Post Acute Care Choice:    Choice offered to:     DME Arranged:    DME Agency:     HH Arranged:    HH Agency:     Status of Service:     If discussed at [employer], dates discussed:    Additional Comments:  Katrina Stack, RN 05/22/2017, 2:31 PM

## 2017-05-22 NOTE — Progress Notes (Signed)
Went over discharge instruction with the patient including medication and follow-up appointment. Discontinue peripheral IV and will discontinue telemetry monitor once EMS is here. Will call EMS for non-emergent transport.

## 2017-05-22 NOTE — Progress Notes (Signed)
       [facility]  SUBJECTIVE: Feels better. Less sob   Vitals:   05/21/17 0744 05/21/17 1954 05/22/17 0339 05/22/17 0749  BP: 123/74 117/77 109/66 (!) 127/59  Pulse: 75 74 75 70  Resp: 18 18 18 18   Temp: 98 F (36.7 C) 98.5 F (36.9 C) 97.7 F (36.5 C) 98.1 F (36.7 C)  TempSrc: Oral Oral Oral   SpO2: 94% 95% 94% 98%  Weight:   (!) 141 kg (310 lb 14.4 oz)   Height:        Intake/Output Summary (Last 24 hours) at 05/22/2017 0837 Last data filed at 05/22/2017 0500 Gross per 24 hour  Intake 843 ml  Output 1550 ml  Net -707 ml    LABS: Basic Metabolic Panel: Recent Labs    05/21/17 0602  NA 138  K 4.1  CL 106  CO2 23  GLUCOSE 129*  BUN 51*  CREATININE 1.23*  CALCIUM 8.9   Liver Function Tests: No results for input(s): AST, ALT, ALKPHOS, BILITOT, PROT, ALBUMIN in the last 72 hours. No results for input(s): LIPASE, AMYLASE in the last 72 hours. CBC: Recent Labs    05/21/17 0602 05/22/17 0537  WBC 8.8 6.9  HGB 14.2 13.5  HCT 43.5 42.2  MCV 86.2 85.5  PLT 192 208   Cardiac Enzymes: Recent Labs    05/19/17 1416  TROPONINI 2.24*   BNP: Invalid input(s): POCBNP D-Dimer: No results for input(s): DDIMER in the last 72 hours. Hemoglobin A1C: No results for input(s): HGBA1C in the last 72 hours. Fasting Lipid Panel: No results for input(s): CHOL, HDL, LDLCALC, TRIG, CHOLHDL, LDLDIRECT in the last 72 hours. Thyroid Function Tests: No results for input(s): TSH, T4TOTAL, T3FREE, THYROIDAB in the last 72 hours.  Invalid input(s): FREET3 Anemia Panel: No results for input(s): VITAMINB12, FOLATE, FERRITIN, TIBC, IRON, RETICCTPCT in the last 72 hours.   Physical Exam: Blood pressure (!) 127/59, pulse 70, temperature 98.1 F (36.7 C), resp. rate 18, height 5\' 5"  (1.651 m), weight (!) 141 kg (310 lb 14.4 oz), SpO2 98 %.   Wt Readings from Last 1 Encounters:  05/22/17 (!) 141 kg (310 lb 14.4 oz)     General  appearance: alert and cooperative Resp: clear to auscultation bilaterally Cardio: regular rate and rhythm GI: soft, non-tender; bowel sounds normal; no masses,  no organomegaly Neurologic: Grossly normal  TELEMETRY: Reviewed telemetry pt in nsr:  ASSESSMENT AND PLAN:  Active Problems:   Syncope, near-no further svt or dizziness.    Aortic stenosis-severe as. Clinically stable at present. Have discussed with structural heart team at [facility]. They will see her as outpatient and evaluate for possible TAVR. Would discharge on asa, metoprolol, atorvastatin. Follow up locally with Dr. Deniece Ree, MD, [facility] 05/22/2017 8:37 AM

## 2017-05-24 NOTE — Discharge Summary (Signed)
[place] at [place] NAME: [name]    MR#:  [id number]  DATE OF BIRTH:  02-28-1951  DATE OF ADMISSION:  05/18/2017   ADMITTING PHYSICIAN: Saundra Shelling, MD  DATE OF DISCHARGE: 05/22/2017  4:53 PM  PRIMARY CARE PHYSICIAN: Frazier Richards, MD   ADMISSION DIAGNOSIS:  Aortic valve stenosis, etiology of cardiac valve disease unspecified [I35.0] DISCHARGE DIAGNOSIS:  Active Problems:   Syncope, near   Aortic stenosis  SECONDARY DIAGNOSIS:   Past Medical History:  Diagnosis Date  . Bacteremia   . CAD (coronary artery disease)   . Diabetes mellitus without complication ([place])   . Diastolic CHF ([place])   . Hyperlipemia   . Hypertension   . Lymphedema   . MI (myocardial infarction) ([place])   . OSA (obstructive sleep apnea)   . Severe aortic stenosis    HOSPITAL COURSE:  66 year old female patient with history of severe aortic stenosis, hypertension, hyperlipidemia, diastolic heart failure, chronic lymphedema admitted with shortness of breath, palpitations and lightheadedness.  *Near syncope: likely due to critical severe AS with reduced LV function. - gets into SVT easily on minimal exertion  *Severe aortic stenosis: EF 25-30% - Continue with aspirin 81 mg daily, metoprolol 25 mg twice daily (increased from 12.5 mg) and atorvastatin 40 mg daily.  - Limit activity until further Cardiac evaluation at [facility].   * Elevated serum troponin-due to demand ischemia per cardio.   *Hypertension: continue metoprolol, Lasix.  *Hyperlipidemia: continue Lipitor  *Diastolic heart failure: continue lasix 40 mg daily DISCHARGE CONDITIONS:  stable CONSULTS OBTAINED:  Treatment Team:  Hillary Bow, MD Teodoro Spray, MD DRUG ALLERGIES:   Allergies  Allergen Reactions  . Tetanus Toxoids Swelling  . Lisinopril Rash  . Niacin Rash  . Sitagliptin Rash  . Sulfamethoxazole-Trimethoprim Rash   DISCHARGE MEDICATIONS:    Allergies as of 05/22/2017      Reactions   Tetanus Toxoids Swelling   Lisinopril Rash   Niacin Rash   Sitagliptin Rash   Sulfamethoxazole-trimethoprim Rash      Medication List    TAKE these medications   aspirin EC 81 MG tablet Take 81 mg by mouth daily.   atorvastatin 40 MG tablet Commonly known as:  LIPITOR Take 1 tablet (40 mg total) by mouth daily at 6 PM.   fenofibrate 145 MG tablet Commonly known as:  TRICOR Take 1 tablet by mouth daily.   furosemide 40 MG tablet Commonly known as:  LASIX Take 1 tablet (40 mg total) by mouth daily.   LANTUS SOLOSTAR 100 UNIT/ML Solostar Pen Generic drug:  Insulin Glargine Inject 50 Units into the skin daily. Titrate for morning sugars less than 120   metFORMIN 1000 MG tablet Commonly known as:  GLUCOPHAGE Take 1 tablet (1,000 mg total) by mouth daily with breakfast. What changed:  when to take this   metoprolol tartrate 25 MG tablet Commonly known as:  LOPRESSOR Take 1 tablet (25 mg total) by mouth 2 (two) times daily.   nystatin powder Commonly known as:  MYCOSTATIN/NYSTOP Apply topically 4 (four) times daily.   potassium chloride 10 MEQ tablet Commonly known as:  K-DUR Take 1 tablet by mouth daily.   triamcinolone cream 0.1 % Commonly known as:  KENALOG Apply 1 application topically daily.        DISCHARGE INSTRUCTIONS:   DIET:  Regular diet DISCHARGE CONDITION:  Good ACTIVITY:  Activity as tolerated OXYGEN:  Home Oxygen: No.  Oxygen Delivery:  room air DISCHARGE LOCATION:  home   If you experience worsening of your admission symptoms, develop shortness of breath, life threatening emergency, suicidal or homicidal thoughts you must seek medical attention immediately by calling 911 or calling your MD immediately  if symptoms less severe.  You Must read complete instructions/literature along with all the possible adverse reactions/side effects for all the Medicines you take and that have been  prescribed to you. Take any new Medicines after you have completely understood and accpet all the possible adverse reactions/side effects.   Please note  You were cared for by a hospitalist during your hospital stay. If you have any questions about your discharge medications or the care you received while you were in the hospital after you are discharged, you can call the unit and asked to speak with the hospitalist on call if the hospitalist that took care of you is not available. Once you are discharged, your primary care physician will handle any further medical issues. Please note that NO REFILLS for any discharge medications will be authorized once you are discharged, as it is imperative that you return to your primary care physician (or establish a relationship with a primary care physician if you do not have one) for your aftercare needs so that they can reassess your need for medications and monitor your lab values.    On the day of Discharge:  VITAL SIGNS:  Blood pressure 132/84, pulse 75, temperature 97.8 F (36.6 C), resp. rate 20, height 5\' 5"  (1.651 m), weight (!) 141 kg (310 lb 14.4 oz), SpO2 93 %. PHYSICAL EXAMINATION:  GENERAL:  66 y.o.-year-old patient lying in the bed with no acute distress.  EYES: Pupils equal, round, reactive to light and accommodation. No scleral icterus. Extraocular muscles intact.  HEENT: Head atraumatic, normocephalic. Oropharynx and nasopharynx clear.  NECK:  Supple, no jugular venous distention. No thyroid enlargement, no tenderness.  LUNGS: Normal breath sounds bilaterally, no wheezing, rales,rhonchi or crepitation. No use of accessory muscles of respiration.  CARDIOVASCULAR: S1, S2 normal. No murmurs, rubs, or gallops.  ABDOMEN: Soft, non-tender, non-distended. Bowel sounds present. No organomegaly or mass.  EXTREMITIES: No pedal edema, cyanosis, or clubbing.  NEUROLOGIC: Cranial nerves II through XII are intact. Muscle strength 5/5 in all  extremities. Sensation intact. Gait not checked.  PSYCHIATRIC: The patient is alert and oriented x 3.  SKIN: No obvious rash, lesion, or ulcer.  DATA REVIEW:   CBC Recent Labs  Lab 05/22/17 0537  WBC 6.9  HGB 13.5  HCT 42.2  PLT 208    Chemistries  Recent Labs  Lab 05/22/17 0537  NA 138  K 3.9  CL 106  CO2 24  GLUCOSE 71  BUN 49*  CREATININE 1.16*  CALCIUM 8.8*     Follow-up Information    Frazier Richards, MD. Go on 06/05/2017.   Specialty:  Family Medicine Why:  @ 10:10am  Contact information: [place] [address] [phone]        Corey Skains, MD. Go on 05/23/2017.   Specialty:  Cardiology Why:  @3 :15 pm Contact information: [address] [facility] [place] [address] [phone]           She is at very high risk for readmissions  Management plans discussed with the patient, Dr Ubaldo Glassing and they are in agreement.  CODE STATUS: Prior   TOTAL TIME TAKING CARE OF THIS PATIENT: 45 minutes.    Max Sane M.D on 05/24/2017  at 5:46 PM  Between 7am to 6pm - Pager - [phone]  After 6pm go to [email or web address] - [employer] [facility]  Office  [phone]  CC: Primary care physician; Frazier Richards, MD   Note: This dictation was prepared with Dragon dictation along with smaller phrase technology. Any transcriptional errors that result from this process are unintentional.

## 2017-05-30 ENCOUNTER — Ambulatory Visit: Payer: Medicare Other | Admitting: Family

## 2017-06-26 HISTORY — PX: CARDIAC SURGERY: SHX584

## 2017-07-11 ENCOUNTER — Other Ambulatory Visit: Payer: Self-pay

## 2017-07-11 ENCOUNTER — Emergency Department: Payer: Medicare Other

## 2017-07-11 ENCOUNTER — Inpatient Hospital Stay
Admission: EM | Admit: 2017-07-11 | Discharge: 2017-07-16 | DRG: 308 | Disposition: A | Discharge status: Skilled Nursing Facility | Payer: Medicare Other | Attending: Internal Medicine | Admitting: Internal Medicine

## 2017-07-11 DIAGNOSIS — I420 Dilated cardiomyopathy: Secondary | ICD-10-CM | POA: Diagnosis present

## 2017-07-11 DIAGNOSIS — R748 Abnormal levels of other serum enzymes: Secondary | ICD-10-CM | POA: Diagnosis present

## 2017-07-11 DIAGNOSIS — E119 Type 2 diabetes mellitus without complications: Secondary | ICD-10-CM | POA: Diagnosis present

## 2017-07-11 DIAGNOSIS — G4733 Obstructive sleep apnea (adult) (pediatric): Secondary | ICD-10-CM | POA: Diagnosis present

## 2017-07-11 DIAGNOSIS — I350 Nonrheumatic aortic (valve) stenosis: Secondary | ICD-10-CM | POA: Diagnosis present

## 2017-07-11 DIAGNOSIS — Z79899 Other long term (current) drug therapy: Secondary | ICD-10-CM | POA: Diagnosis not present

## 2017-07-11 DIAGNOSIS — K5900 Constipation, unspecified: Secondary | ICD-10-CM | POA: Diagnosis not present

## 2017-07-11 DIAGNOSIS — I222 Subsequent non-ST elevation (NSTEMI) myocardial infarction: Secondary | ICD-10-CM | POA: Diagnosis not present

## 2017-07-11 DIAGNOSIS — Z888 Allergy status to other drugs, medicaments and biological substances status: Secondary | ICD-10-CM

## 2017-07-11 DIAGNOSIS — I110 Hypertensive heart disease with heart failure: Secondary | ICD-10-CM | POA: Diagnosis present

## 2017-07-11 DIAGNOSIS — E785 Hyperlipidemia, unspecified: Secondary | ICD-10-CM | POA: Diagnosis present

## 2017-07-11 DIAGNOSIS — I5033 Acute on chronic diastolic (congestive) heart failure: Secondary | ICD-10-CM | POA: Diagnosis present

## 2017-07-11 DIAGNOSIS — I9589 Other hypotension: Secondary | ICD-10-CM | POA: Diagnosis present

## 2017-07-11 DIAGNOSIS — I214 Non-ST elevation (NSTEMI) myocardial infarction: Secondary | ICD-10-CM

## 2017-07-11 DIAGNOSIS — I959 Hypotension, unspecified: Secondary | ICD-10-CM

## 2017-07-11 DIAGNOSIS — I472 Ventricular tachycardia: Secondary | ICD-10-CM

## 2017-07-11 DIAGNOSIS — I2510 Atherosclerotic heart disease of native coronary artery without angina pectoris: Secondary | ICD-10-CM | POA: Diagnosis present

## 2017-07-11 DIAGNOSIS — Z7982 Long term (current) use of aspirin: Secondary | ICD-10-CM

## 2017-07-11 DIAGNOSIS — I471 Supraventricular tachycardia: Secondary | ICD-10-CM | POA: Diagnosis not present

## 2017-07-11 DIAGNOSIS — R079 Chest pain, unspecified: Secondary | ICD-10-CM

## 2017-07-11 DIAGNOSIS — R001 Bradycardia, unspecified: Secondary | ICD-10-CM

## 2017-07-11 DIAGNOSIS — I252 Old myocardial infarction: Secondary | ICD-10-CM

## 2017-07-11 DIAGNOSIS — R000 Tachycardia, unspecified: Secondary | ICD-10-CM

## 2017-07-11 LAB — BASIC METABOLIC PANEL
Anion gap: 15 (ref 5–15)
BUN: 41 mg/dL — ABNORMAL HIGH (ref 6–20)
CHLORIDE: 109 mmol/L (ref 101–111)
CO2: 15 mmol/L — AB (ref 22–32)
CREATININE: 1.43 mg/dL — AB (ref 0.44–1.00)
Calcium: 8.8 mg/dL — ABNORMAL LOW (ref 8.9–10.3)
GFR calc non Af Amer: 37 mL/min — ABNORMAL LOW (ref >60)
GFR, EST AFRICAN AMERICAN: 43 mL/min — AB (ref >60)
Glucose, Bld: 196 mg/dL — ABNORMAL HIGH (ref 65–99)
Potassium: 4.9 mmol/L (ref 3.5–5.1)
Sodium: 139 mmol/L (ref 135–145)

## 2017-07-11 LAB — CBC
HEMATOCRIT: 47.1 % — AB (ref 35.0–47.0)
HEMOGLOBIN: 14.7 g/dL (ref 12.0–16.0)
MCH: 26.8 pg (ref 26.0–34.0)
MCHC: 31.3 g/dL — AB (ref 32.0–36.0)
MCV: 85.5 fL (ref 80.0–100.0)
Platelets: 196 10*3/uL (ref 150–440)
RBC: 5.51 MIL/uL — ABNORMAL HIGH (ref 3.80–5.20)
RDW: 17.5 % — ABNORMAL HIGH (ref 11.5–14.5)
WBC: 7.9 10*3/uL (ref 3.6–11.0)

## 2017-07-11 LAB — TROPONIN I: Troponin I: 3.91 ng/mL (ref <0.03)

## 2017-07-11 LAB — GLUCOSE, CAPILLARY
GLUCOSE-CAPILLARY: 128 mg/dL — AB (ref 65–99)
Glucose-Capillary: 168 mg/dL — ABNORMAL HIGH (ref 65–99)

## 2017-07-11 LAB — BRAIN NATRIURETIC PEPTIDE: B NATRIURETIC PEPTIDE 5: 2017 pg/mL — AB (ref 0.0–100.0)

## 2017-07-11 MED ORDER — POLYETHYLENE GLYCOL 3350 17 G PO PACK: 17.0000 g | PACK | Freq: Every day | ORAL | Status: DC | PRN

## 2017-07-11 MED ORDER — INSULIN ASPART 100 UNIT/ML ~~LOC~~ SOLN
0.0000 [IU] | Freq: Three times a day (TID) | SUBCUTANEOUS | Status: DC
  Administered 2017-07-12 – 2017-07-14 (×6): 3 [IU] via SUBCUTANEOUS
  Administered 2017-07-14: 5 [IU] via SUBCUTANEOUS
  Administered 2017-07-15: 2 [IU] via SUBCUTANEOUS
  Administered 2017-07-15: 5 [IU] via SUBCUTANEOUS
  Filled 2017-07-11 (×9): qty 1

## 2017-07-11 MED ORDER — LABETALOL HCL 5 MG/ML IV SOLN
10.0000 mg | Freq: Once | INTRAVENOUS | Status: AC
  Administered 2017-07-11: 10 mg via INTRAVENOUS

## 2017-07-11 MED ORDER — NOREPINEPHRINE BITARTRATE 1 MG/ML IV SOLN
0.0000 ug/min | Freq: Once | INTRAVENOUS | Status: AC
  Administered 2017-07-11: 10 ug/min via INTRAVENOUS
  Filled 2017-07-11 (×3): qty 4

## 2017-07-11 MED ORDER — LABETALOL HCL 5 MG/ML IV SOLN
INTRAVENOUS | Status: AC
  Administered 2017-07-11: 10 mg via INTRAVENOUS
  Filled 2017-07-11: qty 4

## 2017-07-11 MED ORDER — IOPAMIDOL (ISOVUE-370) INJECTION 76%
100.0000 mL | Freq: Once | INTRAVENOUS | Status: AC | PRN
  Administered 2017-07-11: 100 mL via INTRAVENOUS

## 2017-07-11 MED ORDER — AMIODARONE HCL IN DEXTROSE 360-4.14 MG/200ML-% IV SOLN
30.0000 mg/h | INTRAVENOUS | Status: DC
  Administered 2017-07-11 – 2017-07-13 (×5): 30 mg/h via INTRAVENOUS
  Filled 2017-07-11 (×3): qty 200

## 2017-07-11 MED ORDER — HEPARIN (PORCINE) IN NACL 100-0.45 UNIT/ML-% IJ SOLN
1400.0000 [IU]/h | INTRAMUSCULAR | Status: DC
  Administered 2017-07-11 – 2017-07-12 (×2): 1200 [IU]/h via INTRAVENOUS
  Filled 2017-07-11 (×2): qty 250

## 2017-07-11 MED ORDER — ONDANSETRON HCL 4 MG/2ML IJ SOLN
4.0000 mg | Freq: Four times a day (QID) | INTRAMUSCULAR | Status: DC | PRN
  Administered 2017-07-14 – 2017-07-15 (×3): 4 mg via INTRAVENOUS
  Filled 2017-07-11 (×3): qty 2

## 2017-07-11 MED ORDER — HEPARIN SODIUM (PORCINE) 5000 UNIT/ML IJ SOLN
4000.0000 [IU] | Freq: Once | INTRAMUSCULAR | Status: AC
  Administered 2017-07-11: 4000 [IU] via INTRAVENOUS

## 2017-07-11 MED ORDER — AMIODARONE LOAD VIA INFUSION
150.0000 mg | Freq: Once | INTRAVENOUS | Status: DC
  Filled 2017-07-11: qty 83.34

## 2017-07-11 MED ORDER — ASPIRIN 81 MG PO CHEW
324.0000 mg | CHEWABLE_TABLET | Freq: Once | ORAL | Status: AC
  Administered 2017-07-11: 324 mg via ORAL
  Filled 2017-07-11: qty 4

## 2017-07-11 MED ORDER — INSULIN ASPART 100 UNIT/ML ~~LOC~~ SOLN
0.0000 [IU] | Freq: Every day | SUBCUTANEOUS | Status: DC
  Administered 2017-07-12: 2 [IU] via SUBCUTANEOUS
  Filled 2017-07-11: qty 1

## 2017-07-11 MED ORDER — SODIUM CHLORIDE 0.9 % IV SOLN
Freq: Once | INTRAVENOUS | Status: AC
  Administered 2017-07-11: 21:00:00 via INTRAVENOUS

## 2017-07-11 MED ORDER — FENTANYL CITRATE (PF) 100 MCG/2ML IJ SOLN
INTRAMUSCULAR | Status: AC
  Administered 2017-07-11: 25 ug
  Filled 2017-07-11: qty 2

## 2017-07-11 MED ORDER — ACETAMINOPHEN 325 MG PO TABS
650.0000 mg | ORAL_TABLET | Freq: Four times a day (QID) | ORAL | Status: DC | PRN
  Administered 2017-07-14 – 2017-07-15 (×3): 650 mg via ORAL
  Filled 2017-07-11 (×3): qty 2

## 2017-07-11 MED ORDER — ACETAMINOPHEN 650 MG RE SUPP: 650.0000 mg | Freq: Four times a day (QID) | RECTAL | Status: DC | PRN

## 2017-07-11 MED ORDER — ONDANSETRON HCL 4 MG PO TABS: 4.0000 mg | ORAL_TABLET | Freq: Four times a day (QID) | ORAL | Status: DC | PRN

## 2017-07-11 MED ORDER — ALBUTEROL SULFATE (2.5 MG/3ML) 0.083% IN NEBU: 2.5000 mg | INHALATION_SOLUTION | RESPIRATORY_TRACT | Status: DC | PRN

## 2017-07-11 MED ORDER — SODIUM CHLORIDE 0.9 % IV BOLUS (SEPSIS)
1000.0000 mL | Freq: Once | INTRAVENOUS | Status: AC
  Administered 2017-07-11: 1000 mL via INTRAVENOUS

## 2017-07-11 MED ORDER — SODIUM CHLORIDE 0.9% FLUSH
3.0000 mL | Freq: Two times a day (BID) | INTRAVENOUS | Status: DC
  Administered 2017-07-12 – 2017-07-16 (×7): 3 mL via INTRAVENOUS

## 2017-07-11 MED ORDER — AMIODARONE IV BOLUS ONLY 150 MG/100ML
INTRAVENOUS | Status: AC
  Administered 2017-07-11: 150 mg
  Filled 2017-07-11: qty 100

## 2017-07-11 MED ORDER — LEVALBUTEROL HCL 1.25 MG/0.5ML IN NEBU
1.2500 mg | INHALATION_SOLUTION | Freq: Four times a day (QID) | RESPIRATORY_TRACT | Status: DC | PRN
  Filled 2017-07-11: qty 0.5

## 2017-07-11 MED ORDER — AMIODARONE HCL IN DEXTROSE 360-4.14 MG/200ML-% IV SOLN: 60.0000 mg/h | INTRAVENOUS | Status: DC

## 2017-07-11 MED ORDER — AMIODARONE HCL IN DEXTROSE 360-4.14 MG/200ML-% IV SOLN
INTRAVENOUS | Status: AC
  Filled 2017-07-11: qty 200

## 2017-07-11 NOTE — ED Notes (Signed)
Per pharmacy Levophed and Heparin are compatible

## 2017-07-11 NOTE — ED Triage Notes (Signed)
EMS stated HR on arrival 199, 1700 gave 6 mg Adenosine, no affect, gave 12 mg Adensine, no affect.  Gave 150 mb of Amiodarone in 100 ml bag dropped HR to 177.   VTach started

## 2017-07-11 NOTE — ED Notes (Signed)
Assigned ICU bed unavaliable

## 2017-07-11 NOTE — Consult Note (Signed)
Name: [name] MRN: [id number] DOB: [birth year]    ADMISSION DATE:  07/11/2017 CONSULTATION DATE: 07/11/2017  REFERRING MD : Dr. Darvin Neighbours  CHIEF COMPLAINT: Chest Pain   BRIEF PATIENT DESCRIPTION: 67 yo female admitted with wide complex tachycardia and hypotension requiring amiodarone and levophed gtts  SIGNIFICANT EVENTS  01/16-Pt admitted to ICU   STUDIES:  CT Angio Chest/Abd 01/16>>No evidence of aortic dissection or intramural hematoma. Maximal diameter of the ascending aorta is 4.1 cm. Recommend annual imaging followup by CTA or MRA. This recommendation follows 2010 ACCF/AHA/AATS/ACR/ASA/SCA/SCAI/SIR /STS/SVM Guidelines for the Diagnosis and Management of Patients with Thoracic Aortic Disease. Circulation. 2010; 121: D782-U235 Prominent aortic valvular calcifications. Consider echocardiogram to evaluate for pressure gradient. Mild mediastinal and hilar adenopathy.  This is nonspecific. 4 mm sub solid nodule in the right middle lobe. Celiac axis is ectatic with a maximal diameter of 1.2 cm. Contrast reflux into the hepatic veins suggesting an element of right heart dysfunction. Cholelithiasis. Anasarca. There is perinephric stranding as well as stranding obscuring the left adrenal gland. Again, this is most likely due to anasarca Fatty replacement of the pancreas Diffuse hepatic steatosis. Small amount of free fluid in the abdomen and pelvis.  HISTORY OF PRESENT ILLNESS:   This is a 67 yo female with a PMH of Severe Aortic Stenosis, Obesity, OSA, MI, Lymphedema, HTN, Hyperlipidemia, Chronic Diastolic CHF, CAD, Diabetes Mellitus, and Bacteremia.  She presented to [facility] ER via EMS on 01/16 with c/o shortness of breath, shoulder and chest pain, and dizziness.  Upon EMS arrival EKG revealed wide complex tachycardia, therefore she received 2 doses of adenosine without improvement.  She also received amiodarone with improvement of blood pressure.  Upon arrival to the ER cardiac rhythm  wide complex tachycardia heart rat 190's, cardiologist Dr. Saralyn Pilar examined pt and amiodarone gtt ordered.  Lab results revealed troponin 3.91 heparin gtt initiated and BNP 2,017 unable to diurese pt hypotensive requiring levophed gtt.  Per ER notes the pt is currently waiting for TAVR at [facility].  She was subsequently admitted to ICU by hospitalist team for further workup and treatment.  PAST MEDICAL HISTORY :   has a past medical history of Bacteremia, CAD (coronary artery disease), Diabetes mellitus without complication ([place]), Diastolic CHF ([place]), Hyperlipemia, Hypertension, Lymphedema, MI (myocardial infarction) ([place]), OSA (obstructive sleep apnea), and Severe aortic stenosis.  has a past surgical history that includes none. Prior to Admission medications   Medication Sig Start Date End Date Taking? Authorizing Provider  aspirin EC 81 MG tablet Take 81 mg by mouth daily.   Yes [provider]  atorvastatin (LIPITOR) 40 MG tablet Take 1 tablet (40 mg total) by mouth daily at 6 PM. 05/09/16  Yes Epifanio Lesches, MD  cephALEXin (KEFLEX) 500 MG capsule Take 1 capsule by mouth daily. 07/02/17  Yes [provider]  fenofibrate (TRICOR) 145 MG tablet Take 1 tablet by mouth daily. 04/17/16  Yes [provider]  furosemide (LASIX) 40 MG tablet Take 1 tablet (40 mg total) by mouth daily. 07/17/16  Yes Vaughan Basta, MD  LANTUS SOLOSTAR 100 UNIT/ML Solostar Pen Inject 50 Units into the skin daily. Titrate for morning sugars less than 120 04/11/16  Yes [provider]  metFORMIN (GLUCOPHAGE) 1000 MG tablet Take 1 tablet (1,000 mg total) by mouth daily with breakfast. 05/22/17  Yes Max Sane, MD  metoprolol tartrate (LOPRESSOR) 25 MG tablet Take 1 tablet (25 mg total) by mouth 2 (two) times daily. 05/22/17  Yes Manuella Ghazi, [place],  MD  potassium chloride (K-DUR) 10 MEQ tablet Take 1 tablet by mouth daily. 03/14/17  Yes [provider]  nystatin  (MYCOSTATIN/NYSTOP) powder Apply topically 4 (four) times daily.  04/18/16   [provider]  triamcinolone cream (KENALOG) 0.1 % Apply 1 application topically daily. 11/29/16   [provider]   Allergies  Allergen Reactions  . Tetanus Toxoids Swelling  . Lisinopril Rash  . Niacin Rash  . Sitagliptin Rash  . Sulfamethoxazole-Trimethoprim Rash    FAMILY HISTORY:  family history includes Cancer in her mother; Heart failure in her father. SOCIAL HISTORY:  reports that  has never smoked. she has never used smokeless tobacco. She reports that she does not drink alcohol or use drugs.  REVIEW OF SYSTEMS: Positives in BOLD Constitutional: Negative for fever, chills, weight loss, malaise/fatigue and diaphoresis.  HENT: Negative for hearing loss, ear pain, nosebleeds, congestion, sore throat, neck pain, tinnitus and ear discharge.   Eyes: Negative for blurred vision, double vision, photophobia, pain, discharge and redness.  Respiratory: cough, hemoptysis, sputum production, shortness of breath, wheezing and stridor.   Cardiovascular: chest pain, palpitations, orthopnea, claudication, leg swelling and PND.  Gastrointestinal: Negative for heartburn, nausea, vomiting, abdominal pain, diarrhea, constipation, blood in stool and melena.  Genitourinary: Negative for dysuria, urgency, frequency, hematuria and flank pain.  Musculoskeletal: shoulder pain, myalgias, back pain, joint pain and falls.  Skin: Negative for itching and rash.  Neurological: dizziness, tingling, tremors, sensory change, speech change, focal weakness, seizures, loss of consciousness, weakness and headaches.  Endo/Heme/Allergies: Negative for environmental allergies and polydipsia. Does not bruise/bleed easily.  SUBJECTIVE:  No complaints at this time.  VITAL SIGNS: Pulse Rate:  [44-63] 44 (01/16 2025) Resp:  [16-25] 21 (01/16 2025) BP: (35-85)/(26-67) 85/64 (01/16 2025) SpO2:  [89 %-100 %] 89 % (01/16  2025) Weight:  [147.4 kg (325 lb)] 147.4 kg (325 lb) (01/16 1745)  PHYSICAL EXAMINATION: General: well developed, well nourished female, NAD  Neuro: alert and oriented, follows commands HEENT: supple, no JVD  Cardiovascular: sinus brady, s1s2 Lungs: clear throughout, even, non labored Abdomen: obese, soft, non tender, non distended  Musculoskeletal: no edema, moves all extremities  Skin: bilateral lower extremity chronic vascular discoloration   Recent Labs  Lab 07/11/17 1734  NA 139  K 4.9  CL 109  CO2 15*  BUN 41*  CREATININE 1.43*  GLUCOSE 196*   Recent Labs  Lab 07/11/17 1734  HGB 14.7  HCT 47.1*  WBC 7.9  PLT 196   Dg Chest Portable 1 View  Result Date: 07/11/2017 CLINICAL DATA:  Ventricular tachycardia.  Weakness. EXAM: PORTABLE CHEST 1 VIEW COMPARISON:  05/18/2017 FINDINGS: Pacemakers overlie the chest. Marked enlargement of the cardiac silhouette as seen previously. Venous hypertension without frank edema. No effusions. No acute bone finding. IMPRESSION: Redemonstration of markedly enlarged cardiac silhouette. Venous hypertension without frank edema. Electronically Signed   By: Nelson Chimes M.D.   On: 07/11/2017 18:11   Ct Angio Chest/abd/pel For Dissection W And/or Wo Contrast  Result Date: 07/11/2017 CLINICAL DATA:  V-tach.  Chest pain.  Acute coronary syndrome. EXAM: CT ANGIOGRAPHY CHEST, ABDOMEN AND PELVIS TECHNIQUE: Multidetector CT imaging through the chest, abdomen and pelvis was performed using the standard protocol during bolus administration of intravenous contrast. Multiplanar reconstructed images and MIPs were obtained and reviewed to evaluate the vascular anatomy. CONTRAST:  185mL ISOVUE-370 IOPAMIDOL (ISOVUE-370) INJECTION 76% COMPARISON:  None. FINDINGS: CTA CHEST FINDINGS Cardiovascular: There is no evidence of aortic dissection or intramural hematoma.  A moderate pericardial effusion is noted which extends around the ascending aorta. Maximal diameter of  the ascending aorta is 4.1 cm. Great vessels are grossly patent. There is no obvious evidence of acute pulmonary thromboembolism. Prominent aortic valvular calcifications are noted. Mediastinum/Nodes: Moderate pericardial effusion is noted. 1.3 cm precarinal lymph node. 1.5 cm short axis diameter prevascular lymph node on image 29. Other scattered small mediastinal nodes are noted. Prominent soft tissue in the right hilum is also worrisome for mild right hilar adenopathy. Mild soft tissue in the left hilum. Lungs/Pleura: No pneumothorax. No pleural effusion. Lungs are under aerated with scattered subsegmental atelectasis. 4 mm sub solid nodule in the right middle lobe on image 65. Musculoskeletal: No vertebral compression deformity. Review of the MIP images confirms the above findings. CTA ABDOMEN AND PELVIS FINDINGS VASCULAR Aorta: There is no evidence of aortic dissection or aneurysm. The aorta is patent. 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 Beyond the origin, the celiac axis is ectatic with a maximal diameter of 1.2 cm. Branch vessels are patent. SMA: Patent. Renals: Single renal arteries are grossly patent. IMA: Patent. Inflow: Common, internal, and external iliac arteries are patent. Review of the MIP images confirms the above findings. NON-VASCULAR Hepatobiliary: There is reflux of iodinated contrast into the hepatic veins compatible with an element of elevated right heart pressures and right heart dysfunction. 1.1 cm calcified gallstone. Diffuse hepatic steatosis. Pancreas: Fatty replaced.  No obvious mass. Spleen: Mildly enlarged with a maximal diameter of 14 cm. Adrenals/Urinary Tract: Perinephric stranding is nonspecific. The adrenal glands are ill-defined. There is also significant stranding in an about the left adrenal gland of unknown significance. Bladder is unremarkable. Stomach/Bowel: Stomach is decompressed. No evidence of disproportionate  dilatation of bowel to suggest obstruction. No obvious mass in the colon. Lymphatic: Several small para-aortic lymph nodes are scattered throughout the retroperitoneum. Reproductive: Uterus and adnexa are grossly within normal limits. Other: There is a small amount of free fluid about the liver, spleen, and pelvis. No free intraperitoneal gas. There is stranding throughout the intra-abdominal and subcutaneous fat likely related to anasarca. Musculoskeletal: Degenerative disc disease at L4-5 is prominent. No vertebral compression deformity. Review of the MIP images confirms the above findings. IMPRESSION: No evidence of aortic dissection or intramural hematoma. Maximal diameter of the ascending aorta is 4.1 cm. Recommend annual imaging followup by CTA or MRA. This recommendation follows 2010 ACCF/AHA/AATS/ACR/ASA/SCA/SCAI/SIR/STS/SVM Guidelines for the Diagnosis and Management of Patients with Thoracic Aortic Disease. Circulation. 2010; 121: M086-P619 Prominent aortic valvular calcifications. Consider echocardiogram to evaluate for pressure gradient. Mild mediastinal and hilar adenopathy.  This is nonspecific. 4 mm sub solid nodule in the right middle lobe. Celiac axis is ectatic with a maximal diameter of 1.2 cm. Contrast reflux into the hepatic veins suggesting an element of right heart dysfunction. Cholelithiasis. Anasarca. There is perinephric stranding as well as stranding obscuring the left adrenal gland. Again, this is most likely due to anasarca Fatty replacement of the pancreas Diffuse hepatic steatosis. Small amount of free fluid in the abdomen and pelvis. Electronically Signed   By: Marybelle Killings M.D.   On: 07/11/2017 19:06    ASSESSMENT / PLAN: Wide Complex Tachycardia  Cardiogenic Shock in setting of severe aortic stenosis  Elevated troponin secondary to demand ischemia vs. NSTEMI Acute renal failure  Hx: HTN, OSA, Diabetes Mellitus, CAD, and Chronic Diastolic CHF P: Supplemental O2 for dyspnea  and/or hypoxia  Prn bronchodilator therapy  Trend troponin's Prn  levophed gtt to maintain map >65 Continue amiodarone and heparin gtts Cardiology consulted appreciate input Echo pending  Continuous telemetry monitoring Trend CBC Monitor for s/sx of bleeding Trend BMP Replace electrolytes as indicated Monitor UOP CBGs ac/hs and SSI   Marda Stalker, [place] Pager [phone] (please enter 7 digits) [place] Pager [phone] (please enter 7 digits)

## 2017-07-11 NOTE — ED Notes (Signed)
Patient transported to CT 

## 2017-07-11 NOTE — ED Notes (Signed)
Patient's heart rhythm changed from V-Tach, Amiodarone bolus stopped and verified decision with  ED provider, ED provider agreed to stop Amiodarone bolus

## 2017-07-11 NOTE — ED Notes (Signed)
Rayetta Humphrey emergency contact cousin [phone]  Cell [phone]

## 2017-07-11 NOTE — ED Triage Notes (Signed)
As per order of ED MD

## 2017-07-11 NOTE — ED Notes (Signed)
Called lab and requested expediting lab work.

## 2017-07-11 NOTE — ED Provider Notes (Addendum)
[facility] Emergency Department Provider Note  ____________________________________________  Time seen: Approximately 6:19 PM  I have reviewed the triage vital signs and the nursing notes.   HISTORY  Chief Complaint Chest Pain (V Tach)    HPI [name] is a 67 y.o. female with a history of severe aortic valve stenosis, diastolic CHF, CAD status post MI, HTN, HL, DM, morbid obesity, presenting with palpitations, tachycardia, diaphoresis.  The patient reports that around noon she began to feel poorly and then at 230 she developed palpitations and a heart racing sensation with profound diaphoresis.  She had a brief episode with darkening of vision and felt like she would faint.  She then proceeded to have multiple episodes of nausea and vomiting.  EMS was called several hours later, and she was found to be tachycardic with a wide-complex tachycardia in the 190s, with no obtainable blood pressure.  She was mentating normally and had reassuring oxygen saturations.  She was given 6 mg and 12 mg of adenosine, followed by 150 mg of amiodarone without any improvement.  On arrival to the emergency department, the patient had a heart rate in the 180s, without an unobtainable blood pressure but continued to answer questions appropriately.  She was diaphoretic and uncomfortable appearing.  The patient is undergoing surgical preparation for TAVR at [facility]  Past Medical History:  Diagnosis Date  . Bacteremia   . CAD (coronary artery disease)   . Diabetes mellitus without complication ([place])   . Diastolic CHF ([place])   . Hyperlipemia   . Hypertension   . Lymphedema   . MI (myocardial infarction) ([place])   . OSA (obstructive sleep apnea)   . Severe aortic stenosis     Patient Active Problem List   Diagnosis Date Noted  . Aortic stenosis 05/21/2017  . Syncope, near 05/19/2017  . Chronic diastolic heart failure ([place]) 07/27/2016  . HTN (hypertension) 07/27/2016  .  Obstructive sleep apnea 07/27/2016  . Cellulitis 07/27/2016  . Pressure injury of skin 05/02/2016  . Lactic acidosis   . Severe aortic stenosis   . OSA on CPAP   . Morbid obesity ([place])   . Community acquired pneumonia     Past Surgical History:  Procedure Laterality Date  . none      Current Outpatient Rx  . Order #: [id number] Class: Historical Med  . Order #: [id number] Class: Normal  . Order #: [id number] Class: Historical Med  . Order #: [id number] Class: Historical Med  . Order #: [id number] Class: Normal  . Order #: [id number] Class: Historical Med  . Order #: [id number] Class: No Print  . Order #: [id number] Class: Normal  . Order #: [id number] Class: Historical Med  . Order #: [id number] Class: Historical Med  . Order #: [id number] Class: Historical Med    Allergies Tetanus toxoids; Lisinopril; Niacin; Sitagliptin; and Sulfamethoxazole-trimethoprim  Family History  Problem Relation Age of Onset  . Cancer Mother   . Heart failure Father     Social History Social History   Tobacco Use  . Smoking status: Never Smoker  . Smokeless tobacco: Never Used  Substance Use Topics  . Alcohol use: No  . Drug use: No    Review of Systems Constitutional: No fever/chills.  Positive general malaise.  Positive lightheadedness.  Positive presyncope without syncope.  Positive profound diaphoresis. Eyes: Positive brief episode of darkening of vision. ENT: No sore throat. No congestion or rhinorrhea. Cardiovascular: Denies chest pain. Denies palpitations. Respiratory: Positive shortness of breath.  No cough. Gastrointestinal: Positive abdominal pain.  Positive  nausea, positive vomiting.  No diarrhea.  No constipation. Genitourinary: Negative for dysuria. Musculoskeletal: Positive for for back pain. Skin: Negative for rash. Neurological: Negative for headaches. No focal numbness, tingling or weakness.     ____________________________________________   PHYSICAL EXAM:  VITAL SIGNS: ED  Triage Vitals [07/11/17 1745]  Enc Vitals Group     BP      Pulse      Resp      Temp      Temp src      SpO2      Weight (!) 325 lb (147.4 kg)     Height 5\' 5"  (1.651 m)     Head Circumference      Peak Flow      Pain Score      Pain Loc      Pain Edu?      Excl. in [place]?     Constitutional: Patient is alert and oriented and answering questions appropriately.  She is profoundly diaphoretic, pale, and uncomfortable appearing.  Toxic in appearance. Eyes: Conjunctivae are normal.  EOMI. No scleral icterus. Head: Atraumatic. Nose: No congestion/rhinnorhea. Mouth/Throat: Mucous membranes are moist.  Neck: No stridor.  Supple.  Positive JVD. Cardiovascular: Fast rate, regular rhythm.  Due to the patient's large body habitus and fast rate, I am unable to auscultate for murmurs rubs or gallops.  Respiratory: The patient is tachypneic with accessory muscle use but no retractions.  She has clear lung fields bilaterally. Gastrointestinal: Morbidly obese.  Soft, nontender and nondistended.  No guarding or rebound.  No peritoneal signs. Musculoskeletal: Bilateral symmetric LE edema. No ttp in the calves or palpable cords.  Negative Homan's sign. Neurologic:  A&Ox3.  Speech is clear.  Face and smile are symmetric.  EOMI.  Moves all extremities well. Skin:  Skin is diaphoretic. Psychiatric: Mood and affect are normal. Speech and behavior are normal.  Normal judgement.  ____________________________________________   LABS (all labs ordered are listed, but only abnormal results are displayed)  Labs Reviewed  CBC - Abnormal; Notable for the following components:      Result Value   RBC 5.51 (*)    HCT 47.1 (*)    MCHC 31.3 (*)    RDW 17.5 (*)    All other components within normal limits  BASIC METABOLIC PANEL - Abnormal; Notable for the following components:   CO2 15 (*)    Glucose, Bld 196 (*)    BUN 41 (*)    Creatinine, Ser 1.43 (*)    Calcium 8.8 (*)    GFR calc non Af Amer 37 (*)     GFR calc Af Amer 43 (*)    All other components within normal limits  TROPONIN I - Abnormal; Notable for the following components:   Troponin I 3.91 (*)    All other components within normal limits  BRAIN NATRIURETIC PEPTIDE - Abnormal; Notable for the following components:   B Natriuretic Peptide 2,017.0 (*)    All other components within normal limits  GLUCOSE, CAPILLARY - Abnormal; Notable for the following components:   Glucose-Capillary 168 (*)    All other components within normal limits  PROTIME-INR  APTT  TYPE AND SCREEN   ____________________________________________  EKG  ED ECG REPORT I, Eula Listen, the attending physician, personally viewed and interpreted this ECG.   Date: 07/11/2017  EKG Time: 1733  Rate: 172  Rhythm: wide complex tachycardia  Axis: leftward  Intervals:prolonged QTc  ST&T Change: ST depression in V2, V3  and V6, aVL I and II   ED ECG REPORT I, Eula Listen, the attending physician, personally viewed and interpreted this ECG.  After 20mg  of Labetolol:   Date: 07/11/2017  EKG Time: 1751   Rate: 149  Rhythm: wide complex tachycardia  Axis: leftward  Intervals:prolonged QTc  ST&T Change: ST depression in 1, 2, and V2  After approximately 75mg  of amiodarone: ED ECG REPORT I, Eula Listen, the attending physician, personally viewed and interpreted this ECG.   Date: 07/11/2017  EKG Time: 1804  Rate: 47  Rhythm: sinus tachycardia  Axis: leftward  Intervals:none  ST&T Change: No STEMI   ____________________________________________  RADIOLOGY  Dg Chest Portable 1 View  Result Date: 07/11/2017 CLINICAL DATA:  Ventricular tachycardia.  Weakness. EXAM: PORTABLE CHEST 1 VIEW COMPARISON:  05/18/2017 FINDINGS: Pacemakers overlie the chest. Marked enlargement of the cardiac silhouette as seen previously. Venous hypertension without frank edema. No effusions. No acute bone finding. IMPRESSION: Redemonstration of  markedly enlarged cardiac silhouette. Venous hypertension without frank edema. Electronically Signed   By: Nelson Chimes M.D.   On: 07/11/2017 18:11    ____________________________________________   PROCEDURES  Procedure(s) performed: None  Procedures  Critical Care performed: Yes, see critical care note(s) ____________________________________________   INITIAL IMPRESSION / ASSESSMENT AND PLAN / ED COURSE  Pertinent labs & imaging results that were available during my care of the patient were reviewed by me and considered in my medical decision making (see chart for details).  67 y.o. female with a history of severe aortic stenosis and CHF, presenting with palpitations and a wide-complex tachycardia with hypotension.  Overall, I am concerned about multiple etiologies including primary arrhythmia, ACS or MI.  Given that the patient is mentating normally, pads were placed on her chest but no cardioversion was performed.  Initially, I treated her with 10 mg and then 10 mg again of labetalol, with some improvement in her heart rate from the 180s to the 150s, and a brief improvement in her blood pressure to 99 over 50s.  However, the patient continued to be profoundly hypotensive with blood pressure in the 70s and I immediately consulted with the cardiologist on call.  Dr. Saralyn Pilar is came to the patient's bedside and we made the following plan; the patient will receive a amiodarone bolus and drip in an attempt for medication cardioversion; if she does not respond, she will be sedated for with electrical cardioversion.  ED Course: After approximately 75 mg of amiodarone, the patient acutely became bradycardic, with continued blood pressures in the 70s.  She received additional intravenous fluids.  At that time, the patient began to complain of severe pain between the scapula and a severe "fullness" in the abdomen.  She denied any tearing sensation, but I immediately became concerned for aortic  pathology.  She was sent for a CT angiogram of the chest abdomen and pelvis to rule out aortic pathology.  The patient was reevaluated multiple times by myself and Dr. Saralyn Pilar, with clinical improvement in her symptoms.  She was given a full aspirin.  At this time, I am still awaiting the results of her CT angiogram, but if this is negative she will receive heparin as she does have a troponin of greater than 3.  She is not a candidate for cardiac catheterization at this time.  I will plan to admit the patient for further evaluation and treatment.  ----------------------------------------- 7:43 PM on 07/11/2017 -----------------------------------------  Clinically, the patient is feeling much better.  She continues to  have heart rate in the high 40s, with a blood pressure in the 70s over 50s and is receiving intravenous fluids at this time.  I have ordered heparin bolus and drip for her, and her amiodarone has been restarted.  We will plan to put her on a pressor.  Unfortunately, no ICU beds are available at [facility] this evening.  I have made a phone call for transfer to [facility] and I am awaiting a return call.  The patient will be started on Levophed at this time.  ----------------------------------------- 8:16 PM on 07/11/2017 -----------------------------------------  The patient is feeling significantly better.  An ICU bed has become available to [facility] and the patient will not be transferred.   CRITICAL CARE Performed by: Eula Listen   Total critical care time: 90 minutes  Critical care time was exclusive of separately billable procedures and treating other patients.  Critical care was necessary to treat or prevent imminent or life-threatening deterioration.  Critical care was time spent personally by me on the following activities: development of treatment plan with patient and/or surrogate as well as nursing, discussions with consultants, evaluation of patient's  response to treatment, examination of patient, obtaining history from patient or surrogate, ordering and performing treatments and interventions, ordering and review of laboratory studies, ordering and review of radiographic studies, pulse oximetry and re-evaluation of patient's condition.   ____________________________________________  FINAL CLINICAL IMPRESSION(S) / ED DIAGNOSES  Final diagnoses:  Wide-complex tachycardia ([place])  Hypotension, unspecified hypotension type  NSTEMI (non-ST elevated myocardial infarction) ([place])  Acute on chronic diastolic congestive heart failure ([place])  Sinus bradycardia         NEW MEDICATIONS STARTED DURING THIS VISIT:  New Prescriptions   No medications on file      Eula Listen, MD 07/11/17 1909    Eula Listen, MD 07/11/17 1944    Eula Listen, MD 07/11/17 1948    Eula Listen, MD 07/11/17 2017

## 2017-07-11 NOTE — ED Notes (Signed)
Appropriate cuff to patients arm, both arms elevated and supported with pillows for pts comfort and accurate assessment

## 2017-07-11 NOTE — Progress Notes (Signed)
ANTICOAGULATION CONSULT NOTE - Initial Consult  Pharmacy Consult for heparin drip Indication: chest pain/ACS  Allergies  Allergen Reactions  . Tetanus Toxoids Swelling  . Lisinopril Rash  . Niacin Rash  . Sitagliptin Rash  . Sulfamethoxazole-Trimethoprim Rash    Patient Measurements: Height: 5\' 5"  (165.1 cm) Weight: (!) 325 lb (147.4 kg) IBW/kg (Calculated) : 57 Heparin Dosing Weight: 94 kg  Vital Signs: BP: 80/55 (01/16 1942) Pulse Rate: 44 (01/16 1942)  Labs: Recent Labs    07/11/17 1734  HGB 14.7  HCT 47.1*  PLT 196  CREATININE 1.43*  TROPONINI 3.91*    Estimated Creatinine Clearance: 56.9 mL/min (A) (by C-G formula based on SCr of 1.43 mg/dL (H)).   Medical History: Past Medical History:  Diagnosis Date  . Bacteremia   . CAD (coronary artery disease)   . Diabetes mellitus without complication ([place])   . Diastolic CHF ([place])   . Hyperlipemia   . Hypertension   . Lymphedema   . MI (myocardial infarction) ([place])   . OSA (obstructive sleep apnea)   . Severe aortic stenosis    Assessment: Heparin drip ordered by MD for ACS/NSTEMI. Per protocol, pharmacy to dose/monitor consult entered. Patient was not on anticoagulants prior to admission. Baseline labs ordered.  Goal of Therapy:  Heparin level 0.3-0.7 units/ml Monitor platelets by anticoagulation protocol: Yes   Plan:  4000 units bolus ordered by MD Start heparin infusion at 1200 units/hr (~13 units/kg/hr per protocol) Check anti-Xa level in 6 hours and daily while on heparin Continue to monitor H&H and platelets  Lenis Noon, PharmD, BCPS Clinical Pharmacist 07/11/2017,7:46 PM

## 2017-07-11 NOTE — H&P (Signed)
[place] [facility] [place] NAME: [name]    MR#:  [id number]  DATE OF BIRTH:  1950-09-05  DATE OF ADMISSION:  07/11/2017  PRIMARY CARE PHYSICIAN: Frazier Richards, MD   REQUESTING/REFERRING PHYSICIAN: Dr. Mariea Clonts  CHIEF COMPLAINT:   Chief Complaint  Patient presents with  . Chest Pain    V Tach    HISTORY OF PRESENT ILLNESS:  [name]  is a 67 y.o. female with a known history of severe aortic stenosis, chronic diastolic CHF, hypertension waiting for TAVR at [facility] presents to the emergency room with shortness of breath, dizziness with wide-complex tachycardia through EMS.  Patient received 2 doses of adenosine without improvement.  Later was given amiodarone which helped the blood pressure.  Seen by Dr. Raynelle Chary of cardiology in the emergency room and suggested starting on amiodarone drip.  Blood pressure has been consistently low and has been started on a small dose of levo fed.  No signs of infection.  Patient feels much better at this time.  She had pain between her shoulder blades which is almost resolved at this time.  Troponin is elevated at 3.9 and started on heparin drip.  Recently was admitted for chest pain and had troponin elevated up to 2.7 and thought to be just demand ischemia from her severe aortic stenosis.  PAST MEDICAL HISTORY:   Past Medical History:  Diagnosis Date  . Bacteremia   . CAD (coronary artery disease)   . Diabetes mellitus without complication ([place])   . Diastolic CHF ([place])   . Hyperlipemia   . Hypertension   . Lymphedema   . MI (myocardial infarction) ([place])   . OSA (obstructive sleep apnea)   . Severe aortic stenosis     PAST SURGICAL HISTORY:   Past Surgical History:  Procedure Laterality Date  . none      SOCIAL HISTORY:   Social History   Tobacco Use  . Smoking status: Never Smoker  . Smokeless tobacco: Never Used  Substance Use Topics  . Alcohol use: No    FAMILY HISTORY:   Family  History  Problem Relation Age of Onset  . Cancer Mother   . Heart failure Father     DRUG ALLERGIES:   Allergies  Allergen Reactions  . Tetanus Toxoids Swelling  . Lisinopril Rash  . Niacin Rash  . Sitagliptin Rash  . Sulfamethoxazole-Trimethoprim Rash    REVIEW OF SYSTEMS:   Review of Systems  Constitutional: Positive for malaise/fatigue. Negative for chills and fever.  HENT: Negative for sore throat.   Eyes: Negative for blurred vision, double vision and pain.  Respiratory: Positive for shortness of breath. Negative for cough, hemoptysis and wheezing.   Cardiovascular: Positive for chest pain and palpitations. Negative for orthopnea and leg swelling.  Gastrointestinal: Negative for abdominal pain, constipation, diarrhea, heartburn, nausea and vomiting.  Genitourinary: Negative for dysuria and hematuria.  Musculoskeletal: Negative for back pain and joint pain.  Skin: Negative for rash.  Neurological: Positive for weakness. Negative for sensory change, speech change, focal weakness and headaches.  Endo/Heme/Allergies: Does not bruise/bleed easily.  Psychiatric/Behavioral: Negative for depression. The patient is not nervous/anxious.     MEDICATIONS AT HOME:   Prior to Admission medications   Medication Sig Start Date End Date Taking? Authorizing Provider  aspirin EC 81 MG tablet Take 81 mg by mouth daily.   Yes [provider]  atorvastatin (LIPITOR) 40 MG tablet Take 1 tablet (40 mg total)  by mouth daily at 6 PM. 05/09/16  Yes Epifanio Lesches, MD  cephALEXin (KEFLEX) 500 MG capsule Take 1 capsule by mouth daily. 07/02/17  Yes [provider]  fenofibrate (TRICOR) 145 MG tablet Take 1 tablet by mouth daily. 04/17/16  Yes [provider]  furosemide (LASIX) 40 MG tablet Take 1 tablet (40 mg total) by mouth daily. 07/17/16  Yes Vaughan Basta, MD  LANTUS SOLOSTAR 100 UNIT/ML Solostar Pen Inject 50 Units into the skin daily. Titrate for  morning sugars less than 120 04/11/16  Yes [provider]  metFORMIN (GLUCOPHAGE) 1000 MG tablet Take 1 tablet (1,000 mg total) by mouth daily with breakfast. 05/22/17  Yes Max Sane, MD  metoprolol tartrate (LOPRESSOR) 25 MG tablet Take 1 tablet (25 mg total) by mouth 2 (two) times daily. 05/22/17  Yes Max Sane, MD  potassium chloride (K-DUR) 10 MEQ tablet Take 1 tablet by mouth daily. 03/14/17  Yes [provider]  nystatin (MYCOSTATIN/NYSTOP) powder Apply topically 4 (four) times daily.  04/18/16   [provider]  triamcinolone cream (KENALOG) 0.1 % Apply 1 application topically daily. 11/29/16   [provider]     VITAL SIGNS:  Blood pressure (!) 85/64, pulse (!) 44, resp. rate (!) 21, height 5\' 5"  (1.651 m), weight (!) 147.4 kg (325 lb), SpO2 (!) 89 %.  PHYSICAL EXAMINATION:  Physical Exam  GENERAL:  67 y.o.-year-old patient lying in the bed .  Morbidly obese EYES: Pupils equal, round, reactive to light and accommodation. No scleral icterus. Extraocular muscles intact.  HEENT: Head atraumatic, normocephalic. Oropharynx and nasopharynx clear. No oropharyngeal erythema, moist oral mucosa  NECK:  Supple, no jugular venous distention. No thyroid enlargement, no tenderness.  LUNGS: Normal breath sounds bilaterally, no wheezing, rales, rhonchi. No use of accessory muscles of respiration.  CARDIOVASCULAR: S1, S2 normal. No murmurs, rubs, or gallops.  ABDOMEN: Soft, nontender, nondistended. Bowel sounds present. No organomegaly or mass.  EXTREMITIES: No pedal edema, cyanosis, or clubbing. + 2 pedal & radial pulses b/l.   NEUROLOGIC: Cranial nerves II through XII are intact. No focal Motor or sensory deficits appreciated b/l PSYCHIATRIC: The patient is alert and oriented x 3. Good affect.  SKIN: Bilateral lower extremity stasis changes  LABORATORY PANEL:   CBC Recent Labs  Lab 07/11/17 1734  WBC 7.9  HGB 14.7  HCT 47.1*  PLT 196    ------------------------------------------------------------------------------------------------------------------  Chemistries  Recent Labs  Lab 07/11/17 1734  NA 139  K 4.9  CL 109  CO2 15*  GLUCOSE 196*  BUN 41*  CREATININE 1.43*  CALCIUM 8.8*   ------------------------------------------------------------------------------------------------------------------  Cardiac Enzymes Recent Labs  Lab 07/11/17 1734  TROPONINI 3.91*   ------------------------------------------------------------------------------------------------------------------  RADIOLOGY:  Dg Chest Portable 1 View  Result Date: 07/11/2017 CLINICAL DATA:  Ventricular tachycardia.  Weakness. EXAM: PORTABLE CHEST 1 VIEW COMPARISON:  05/18/2017 FINDINGS: Pacemakers overlie the chest. Marked enlargement of the cardiac silhouette as seen previously. Venous hypertension without frank edema. No effusions. No acute bone finding. IMPRESSION: Redemonstration of markedly enlarged cardiac silhouette. Venous hypertension without frank edema. Electronically Signed   By: Nelson Chimes M.D.   On: 07/11/2017 18:11   Ct Angio Chest/abd/pel For Dissection W And/or Wo Contrast  Result Date: 07/11/2017 CLINICAL DATA:  V-tach.  Chest pain.  Acute coronary syndrome. EXAM: CT ANGIOGRAPHY CHEST, ABDOMEN AND PELVIS TECHNIQUE: Multidetector CT imaging through the chest, abdomen and pelvis was performed using the standard protocol during bolus administration of intravenous contrast. Multiplanar reconstructed images and  MIPs were obtained and reviewed to evaluate the vascular anatomy. CONTRAST:  128mL ISOVUE-370 IOPAMIDOL (ISOVUE-370) INJECTION 76% COMPARISON:  None. FINDINGS: CTA CHEST FINDINGS Cardiovascular: There is no evidence of aortic dissection or intramural hematoma. A moderate pericardial effusion is noted which extends around the ascending aorta. Maximal diameter of the ascending aorta is 4.1 cm. Great vessels are grossly patent. There  is no obvious evidence of acute pulmonary thromboembolism. Prominent aortic valvular calcifications are noted. Mediastinum/Nodes: Moderate pericardial effusion is noted. 1.3 cm precarinal lymph node. 1.5 cm short axis diameter prevascular lymph node on image 29. Other scattered small mediastinal nodes are noted. Prominent soft tissue in the right hilum is also worrisome for mild right hilar adenopathy. Mild soft tissue in the left hilum. Lungs/Pleura: No pneumothorax. No pleural effusion. Lungs are under aerated with scattered subsegmental atelectasis. 4 mm sub solid nodule in the right middle lobe on image 65. Musculoskeletal: No vertebral compression deformity. Review of the MIP images confirms the above findings. CTA ABDOMEN AND PELVIS FINDINGS VASCULAR Aorta: There is no evidence of aortic dissection or aneurysm. The aorta is patent. 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 Beyond the origin, the celiac axis is ectatic with a maximal diameter of 1.2 cm. Branch vessels are patent. SMA: Patent. Renals: Single renal arteries are grossly patent. IMA: Patent. Inflow: Common, internal, and external iliac arteries are patent. Review of the MIP images confirms the above findings. NON-VASCULAR Hepatobiliary: There is reflux of iodinated contrast into the hepatic veins compatible with an element of elevated right heart pressures and right heart dysfunction. 1.1 cm calcified gallstone. Diffuse hepatic steatosis. Pancreas: Fatty replaced.  No obvious mass. Spleen: Mildly enlarged with a maximal diameter of 14 cm. Adrenals/Urinary Tract: Perinephric stranding is nonspecific. The adrenal glands are ill-defined. There is also significant stranding in an about the left adrenal gland of unknown significance. Bladder is unremarkable. Stomach/Bowel: Stomach is decompressed. No evidence of disproportionate dilatation of bowel to suggest obstruction. No obvious mass in the colon.  Lymphatic: Several small para-aortic lymph nodes are scattered throughout the retroperitoneum. Reproductive: Uterus and adnexa are grossly within normal limits. Other: There is a small amount of free fluid about the liver, spleen, and pelvis. No free intraperitoneal gas. There is stranding throughout the intra-abdominal and subcutaneous fat likely related to anasarca. Musculoskeletal: Degenerative disc disease at L4-5 is prominent. No vertebral compression deformity. Review of the MIP images confirms the above findings. IMPRESSION: No evidence of aortic dissection or intramural hematoma. Maximal diameter of the ascending aorta is 4.1 cm. Recommend annual imaging followup by CTA or MRA. This recommendation follows 2010 ACCF/AHA/AATS/ACR/ASA/SCA/SCAI/SIR/STS/SVM Guidelines for the Diagnosis and Management of Patients with Thoracic Aortic Disease. Circulation. 2010; 121: G956-O130 Prominent aortic valvular calcifications. Consider echocardiogram to evaluate for pressure gradient. Mild mediastinal and hilar adenopathy.  This is nonspecific. 4 mm sub solid nodule in the right middle lobe. Celiac axis is ectatic with a maximal diameter of 1.2 cm. Contrast reflux into the hepatic veins suggesting an element of right heart dysfunction. Cholelithiasis. Anasarca. There is perinephric stranding as well as stranding obscuring the left adrenal gland. Again, this is most likely due to anasarca Fatty replacement of the pancreas Diffuse hepatic steatosis. Small amount of free fluid in the abdomen and pelvis. Electronically Signed   By: Marybelle Killings M.D.   On: 07/11/2017 19:06     IMPRESSION AND PLAN:   *Wide-complex tachycardia likely SVT.  Presently patient is in normal sinus rhythm  on amiodarone drip and will be continued.  Cardiology has evaluated the patient in the emergency room.  Will check echocardiogram.  *Hypotension likely due to severe aortic stenosis.  Continue levo fed and wean. No signs of  infection. Echocardiogram ordered and pending  *Elevated troponin likely due to demand ischemia.  Will repeat troponin.  For now continue heparin drip.  Cardiology consulted.  *Diabetes mellitus.  Sliding scale insulin ordered.  All the records are reviewed and case discussed with ED provider. Management plans discussed with the patient, family and they are in agreement.  CODE STATUS: Full code  TOTAL CC TIME TAKING CARE OF THIS PATIENT: 40 minutes.   Neita Carp M.D on 07/11/2017 at 8:48 PM  Between 7am to 6pm - Pager - [phone]  After 6pm go to [email or web address] - password [email or web address] [place]  Office  [phone]  CC: Primary care physician; Frazier Richards, MD  Note: This dictation was prepared with Dragon dictation along with smaller phrase technology. Any transcriptional errors that result from this process are unintentional.

## 2017-07-11 NOTE — ED Notes (Signed)
Date and time results received: 07/11/17 1815 (use smartphrase ".now" to insert current time)  Test: Troponin Critical Value: 3.91  Name of Provider Notified: Dr Mariea Clonts  Orders Received? Or Actions Taken?: Notified

## 2017-07-11 NOTE — ED Notes (Signed)
Patient returned from CT

## 2017-07-11 NOTE — Consult Note (Signed)
[facility] Cardiology  CARDIOLOGY CONSULT NOTE  Patient ID: [name] MRN: [id number] DOB/AGE: 67-28-1952 67 y.o.  Admit date: 07/11/2017 Referring Physician Mariea Clonts Primary Physician [facility] Primary Cardiologist Nehemiah Massed Reason for Consultation wide-complex tachycardia  HPI: 67 year old female referred for evaluation of wide-complex tachycardia.  The patient has known history of severe aortic stenosis, with severe left ventricular hypertrophy, severely reduced left ventricular function, with LVEF of 25%, with calculated aortic valve area 0.49 cm square, peak gradient of 108.2 mmHg, mean gradient of 69.3 mmHg, and peak velocity of 5.2 m/s.  The patient has been referred to [facility] and has seen Dr. Marisa Severin, and is awaiting cardiac catheterization and probable TAVR.  The patient was brought to [facility] emergency room via EMS with wide-complex tachycardia, treated with 6 and 12 mg of adenosine, and amiodarone bolus of 150 mg without effect.  The patient received intravenous labetalol with modest overall improvement in heart rate.  Review of systems complete and found to be negative unless listed above     Past Medical History:  Diagnosis Date  . Bacteremia   . CAD (coronary artery disease)   . Diabetes mellitus without complication ([place])   . Diastolic CHF ([place])   . Hyperlipemia   . Hypertension   . Lymphedema   . MI (myocardial infarction) ([place])   . OSA (obstructive sleep apnea)   . Severe aortic stenosis     Past Surgical History:  Procedure Laterality Date  . none       (Not in a hospital admission) Social History   Socioeconomic History  . Marital status: Single    Spouse name: Not on file  . Number of children: Not on file  . Years of education: Not on file  . Highest education level: Not on file  Social Needs  . Financial resource strain: Not on file  . Food insecurity - worry: Not on file  . Food insecurity - inability: Not on file  . Transportation  needs - medical: Not on file  . Transportation needs - non-medical: Not on file  Occupational History  . Not on file  Tobacco Use  . Smoking status: Never Smoker  . Smokeless tobacco: Never Used  Substance and Sexual Activity  . Alcohol use: No  . Drug use: No  . Sexual activity: Not on file  Other Topics Concern  . Not on file  Social History Narrative  . Not on file    Family History  Problem Relation Age of Onset  . Cancer Mother   . Heart failure Father       Review of systems complete and found to be negative unless listed above      PHYSICAL EXAM  General: Well developed, well nourished, in no acute distress HEENT:  Normocephalic and atramatic Neck:  No JVD.  Lungs: Clear bilaterally to auscultation and percussion. Heart: HRRR . Normal S1 and S2 without gallops or murmurs.  Abdomen: Bowel sounds are positive, abdomen soft and non-tender  Msk:  Back normal, normal gait. Normal strength and tone for age. Extremities: No clubbing, cyanosis or edema.   Neuro: Alert and oriented X 3. Psych:  Good affect, responds appropriately  Labs:   Lab Results  Component Value Date   WBC 7.9 07/11/2017   HGB 14.7 07/11/2017   HCT 47.1 (H) 07/11/2017   MCV 85.5 07/11/2017   PLT 196 07/11/2017   Recent Labs  Lab 07/11/17 1734  NA 139  K 4.9  CL 109  CO2 15*  BUN 41*  CREATININE 1.43*  CALCIUM 8.8*  GLUCOSE 196*   Lab Results  Component Value Date   TROPONINI 3.91 (HH) 07/11/2017    Lab Results  Component Value Date   CHOL 86 05/04/2016   Lab Results  Component Value Date   HDL 38 (L) 05/04/2016   Lab Results  Component Value Date   LDLCALC 27 05/04/2016   Lab Results  Component Value Date   TRIG 105 05/04/2016   TRIG 87 05/02/2016   Lab Results  Component Value Date   CHOLHDL 2.3 05/04/2016   No results found for: LDLDIRECT    Radiology: Dg Chest Portable 1 View  Result Date: 07/11/2017 CLINICAL DATA:  Ventricular tachycardia.  Weakness.  EXAM: PORTABLE CHEST 1 VIEW COMPARISON:  05/18/2017 FINDINGS: Pacemakers overlie the chest. Marked enlargement of the cardiac silhouette as seen previously. Venous hypertension without frank edema. No effusions. No acute bone finding. IMPRESSION: Redemonstration of markedly enlarged cardiac silhouette. Venous hypertension without frank edema. Electronically Signed   By: Nelson Chimes M.D.   On: 07/11/2017 18:11    EKG: Wide-complex tachycardia  ASSESSMENT AND PLAN:   1.  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  Repeat amiodarone bolus, and start amiodarone drip 2.  If patient refractory to repeat amiodarone bolus and drip, will proceed with emergent cardioversion  Signed: Isaias Cowman MD,PhD, [facility] 07/11/2017, 6:53 PM

## 2017-07-11 NOTE — ED Notes (Signed)
Placed patient on 1.5 l O2 via [place]

## 2017-07-12 DIAGNOSIS — I222 Subsequent non-ST elevation (NSTEMI) myocardial infarction: Secondary | ICD-10-CM

## 2017-07-12 LAB — TROPONIN I
Troponin I: 3.86 ng/mL (ref <0.03)
Troponin I: 3.44 ng/mL (ref <0.03)

## 2017-07-12 LAB — GLUCOSE, CAPILLARY
Glucose-Capillary: 136 mg/dL — ABNORMAL HIGH (ref 65–99)
Glucose-Capillary: 157 mg/dL — ABNORMAL HIGH (ref 65–99)
Glucose-Capillary: 161 mg/dL — ABNORMAL HIGH (ref 65–99)
Glucose-Capillary: 186 mg/dL — ABNORMAL HIGH (ref 65–99)
Glucose-Capillary: 230 mg/dL — ABNORMAL HIGH (ref 65–99)

## 2017-07-12 LAB — CBC
HCT: 49.5 % — ABNORMAL HIGH (ref 35.0–47.0)
Hemoglobin: 14.9 g/dL (ref 12.0–16.0)
MCH: 26.7 pg (ref 26.0–34.0)
MCHC: 30.2 g/dL — AB (ref 32.0–36.0)
MCV: 88.4 fL (ref 80.0–100.0)
Platelets: 159 10*3/uL (ref 150–440)
RBC: 5.6 MIL/uL — AB (ref 3.80–5.20)
RDW: 17.6 % — ABNORMAL HIGH (ref 11.5–14.5)
WBC: 11.6 10*3/uL — ABNORMAL HIGH (ref 3.6–11.0)

## 2017-07-12 LAB — BASIC METABOLIC PANEL
Anion gap: 14 (ref 5–15)
BUN: 43 mg/dL — AB (ref 6–20)
CO2: 17 mmol/L — ABNORMAL LOW (ref 22–32)
CREATININE: 1.71 mg/dL — AB (ref 0.44–1.00)
Calcium: 8.5 mg/dL — ABNORMAL LOW (ref 8.9–10.3)
Chloride: 107 mmol/L (ref 101–111)
GFR calc Af Amer: 35 mL/min — ABNORMAL LOW (ref >60)
GFR calc non Af Amer: 30 mL/min — ABNORMAL LOW (ref >60)
Glucose, Bld: 175 mg/dL — ABNORMAL HIGH (ref 65–99)
Potassium: 5.2 mmol/L — ABNORMAL HIGH (ref 3.5–5.1)
SODIUM: 138 mmol/L (ref 135–145)

## 2017-07-12 LAB — HEMOGLOBIN A1C
Hgb A1c MFr Bld: 6.4 % — ABNORMAL HIGH (ref 4.8–5.6)
Mean Plasma Glucose: 136.98 mg/dL

## 2017-07-12 LAB — MRSA PCR SCREENING: MRSA BY PCR: POSITIVE — AB

## 2017-07-12 LAB — PROTIME-INR
INR: 1.93
Prothrombin Time: 21.9 seconds — ABNORMAL HIGH (ref 11.4–15.2)

## 2017-07-12 LAB — APTT: aPTT: 124 seconds — ABNORMAL HIGH (ref 24–36)

## 2017-07-12 LAB — HEPARIN LEVEL (UNFRACTIONATED)
Heparin Unfractionated: 0.52 IU/mL (ref 0.30–0.70)
Heparin Unfractionated: 0.37 IU/mL (ref 0.30–0.70)

## 2017-07-12 MED ORDER — SODIUM CHLORIDE 0.9 % WEIGHT BASED INFUSION
3.0000 mL/kg/h | INTRAVENOUS | Status: AC
  Administered 2017-07-13: 3 mL/kg/h via INTRAVENOUS

## 2017-07-12 MED ORDER — ATORVASTATIN CALCIUM 20 MG PO TABS
40.0000 mg | ORAL_TABLET | Freq: Every day | ORAL | Status: DC
  Administered 2017-07-12 – 2017-07-15 (×4): 40 mg via ORAL
  Filled 2017-07-12 (×4): qty 2

## 2017-07-12 MED ORDER — NOREPINEPHRINE BITARTRATE 1 MG/ML IV SOLN
0.0000 ug/min | INTRAVENOUS | Status: DC
  Administered 2017-07-12: 16 ug/min via INTRAVENOUS

## 2017-07-12 MED ORDER — SODIUM CHLORIDE 0.9% FLUSH: 3.0000 mL | INTRAVENOUS | Status: DC | PRN

## 2017-07-12 MED ORDER — ASPIRIN 81 MG PO CHEW
81.0000 mg | CHEWABLE_TABLET | ORAL | Status: AC
  Administered 2017-07-13: 81 mg via ORAL
  Filled 2017-07-12: qty 1

## 2017-07-12 MED ORDER — FENOFIBRATE 160 MG PO TABS
160.0000 mg | ORAL_TABLET | Freq: Every day | ORAL | Status: DC
  Administered 2017-07-12 – 2017-07-16 (×5): 160 mg via ORAL
  Filled 2017-07-12 (×5): qty 1

## 2017-07-12 MED ORDER — SODIUM CHLORIDE 0.9 % IV SOLN: 250.0000 mL | INTRAVENOUS | Status: DC | PRN

## 2017-07-12 MED ORDER — SODIUM CHLORIDE 0.9% FLUSH: 3.0000 mL | Freq: Two times a day (BID) | INTRAVENOUS | Status: DC

## 2017-07-12 MED ORDER — MUPIROCIN 2 % EX OINT
1.0000 "application " | TOPICAL_OINTMENT | Freq: Two times a day (BID) | CUTANEOUS | Status: DC
  Administered 2017-07-12 – 2017-07-16 (×9): 1 via NASAL
  Filled 2017-07-12 (×2): qty 22

## 2017-07-12 MED ORDER — CEPHALEXIN 500 MG PO CAPS
500.0000 mg | ORAL_CAPSULE | Freq: Every day | ORAL | Status: DC
  Administered 2017-07-12 – 2017-07-16 (×5): 500 mg via ORAL
  Filled 2017-07-12 (×5): qty 1

## 2017-07-12 MED ORDER — LEVALBUTEROL HCL 1.25 MG/0.5ML IN NEBU
0.6300 mg | INHALATION_SOLUTION | Freq: Four times a day (QID) | RESPIRATORY_TRACT | Status: DC | PRN
  Filled 2017-07-12: qty 0.26

## 2017-07-12 MED ORDER — SODIUM CHLORIDE 0.9 % WEIGHT BASED INFUSION: 1.0000 mL/kg/h | INTRAVENOUS | Status: DC

## 2017-07-12 MED ORDER — ASPIRIN EC 81 MG PO TBEC
81.0000 mg | DELAYED_RELEASE_TABLET | Freq: Every day | ORAL | Status: DC
  Administered 2017-07-12 – 2017-07-16 (×5): 81 mg via ORAL
  Filled 2017-07-12 (×5): qty 1

## 2017-07-12 MED ORDER — FUROSEMIDE 20 MG PO TABS
20.0000 mg | ORAL_TABLET | Freq: Once | ORAL | Status: AC
  Administered 2017-07-12: 20 mg via ORAL
  Filled 2017-07-12: qty 1

## 2017-07-12 MED ORDER — INSULIN GLARGINE 100 UNIT/ML ~~LOC~~ SOLN
30.0000 [IU] | Freq: Every day | SUBCUTANEOUS | Status: DC
  Administered 2017-07-12 – 2017-07-15 (×4): 30 [IU] via SUBCUTANEOUS
  Filled 2017-07-12 (×5): qty 0.3

## 2017-07-12 NOTE — Progress Notes (Signed)
Patient is hemodynamically stable.  Remains on amiodarone infusion.  No respiratory distress.  No further arrhythmias.  I have placed order for transfer to telemetry floor.  After transfer, PCCM will sign off. Please call if we can be of further assistance    Merton Border, MD PCCM service Mobile [phone] Pager [phone] 07/12/2017 1:53 PM

## 2017-07-12 NOTE — Progress Notes (Signed)
[facility]  Patient Status: Active  [facility] is providing the following services: SN, PT ready to discharge prior to admission to [facility]. RNCM made aware.  If patient discharges after hours, please call [phone].   Florene Glen 07/12/2017, 11:46 AM

## 2017-07-12 NOTE — Progress Notes (Signed)
[facility]  SUBJECTIVE: The patient reports feeling better this morning, improvement in breathing, denies chest pain.   Vitals:   07/12/17 1100 07/12/17 1200 07/12/17 1300 07/12/17 1400  BP: (!) 89/65 90/67  (!) 87/62  Pulse: 63 64 70 66  Resp: 20 (!) 21 (!) 26 (!) 21  Temp:      TempSrc:      SpO2: 99% 95% 98% 98%  Weight:      Height:         Intake/Output Summary (Last 24 hours) at 07/12/2017 1515 Last data filed at 07/12/2017 0800 Gross per 24 hour  Intake 1042.08 ml  Output -  Net 1042.08 ml      PHYSICAL EXAM  General: Well developed, well nourished, in no acute distress, lying at maximum incline in bed HEENT:  Normocephalic and atramatic Neck:  No JVD.  Lungs: On supplemental oxygen via nasal cannula Heart: HRRR . 3/6 holosystolic murmur Abdomen: Bowel sounds are positive, abdomen   Msk:  Back normal, gait not assessed.  Extremities: No clubbing, cyanosis or edema.   Neuro: Alert and oriented X 3. Psych:  Good affect, responds appropriately   LABS: Basic Metabolic Panel: Recent Labs    07/11/17 1734 07/12/17 0437  NA 139 138  K 4.9 5.2*  CL 109 107  CO2 15* 17*  GLUCOSE 196* 175*  BUN 41* 43*  CREATININE 1.43* 1.71*  CALCIUM 8.8* 8.5*   Liver Function Tests: No results for input(s): AST, ALT, ALKPHOS, BILITOT, PROT, ALBUMIN in the last 72 hours. No results for input(s): LIPASE, AMYLASE in the last 72 hours. CBC: Recent Labs    07/11/17 1734 07/12/17 0437  WBC 7.9 11.6*  HGB 14.7 14.9  HCT 47.1* 49.5*  MCV 85.5 88.4  PLT 196 159   Cardiac Enzymes: Recent Labs    07/11/17 1734 07/12/17 0109 07/12/17 0715  TROPONINI 3.91* 3.86* 3.44*   BNP: Invalid input(s): POCBNP D-Dimer: No results for input(s): DDIMER in the last 72 hours. Hemoglobin A1C: Recent Labs    07/12/17 0109  HGBA1C 6.4*   Fasting Lipid Panel: No results for input(s): CHOL, HDL, LDLCALC, TRIG, CHOLHDL, LDLDIRECT in the last 72 hours. Thyroid Function  Tests: No results for input(s): TSH, T4TOTAL, T3FREE, THYROIDAB in the last 72 hours.  Invalid input(s): FREET3 Anemia Panel: No results for input(s): VITAMINB12, FOLATE, FERRITIN, TIBC, IRON, RETICCTPCT in the last 72 hours.  Dg Chest Portable 1 View  Result Date: 07/11/2017 CLINICAL DATA:  Ventricular tachycardia.  Weakness. EXAM: PORTABLE CHEST 1 VIEW COMPARISON:  05/18/2017 FINDINGS: Pacemakers overlie the chest. Marked enlargement of the cardiac silhouette as seen previously. Venous hypertension without frank edema. No effusions. No acute bone finding. IMPRESSION: Redemonstration of markedly enlarged cardiac silhouette. Venous hypertension without frank edema. Electronically Signed   By: Nelson Chimes M.D.   On: 07/11/2017 18:11   Ct Angio Chest/abd/pel For Dissection W And/or Wo Contrast  Result Date: 07/11/2017 CLINICAL DATA:  V-tach.  Chest pain.  Acute coronary syndrome. EXAM: CT ANGIOGRAPHY CHEST, ABDOMEN AND PELVIS TECHNIQUE: Multidetector CT imaging through the chest, abdomen and pelvis was performed using the standard protocol during bolus administration of intravenous contrast. Multiplanar reconstructed images and MIPs were obtained and reviewed to evaluate the vascular anatomy. CONTRAST:  129mL ISOVUE-370 IOPAMIDOL (ISOVUE-370) INJECTION 76% COMPARISON:  None. FINDINGS: CTA CHEST FINDINGS Cardiovascular: There is no evidence of aortic dissection or intramural hematoma. A moderate pericardial effusion is noted which extends around the ascending aorta. Maximal diameter of the  ascending aorta is 4.1 cm. Great vessels are grossly patent. There is no obvious evidence of acute pulmonary thromboembolism. Prominent aortic valvular calcifications are noted. Mediastinum/Nodes: Moderate pericardial effusion is noted. 1.3 cm precarinal lymph node. 1.5 cm short axis diameter prevascular lymph node on image 29. Other scattered small mediastinal nodes are noted. Prominent soft tissue in the right hilum  is also worrisome for mild right hilar adenopathy. Mild soft tissue in the left hilum. Lungs/Pleura: No pneumothorax. No pleural effusion. Lungs are under aerated with scattered subsegmental atelectasis. 4 mm sub solid nodule in the right middle lobe on image 65. Musculoskeletal: No vertebral compression deformity. Review of the MIP images confirms the above findings. CTA ABDOMEN AND PELVIS FINDINGS VASCULAR Aorta: There is no evidence of aortic dissection or aneurysm. The aorta is patent. 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 Beyond the origin, the celiac axis is ectatic with a maximal diameter of 1.2 cm. Branch vessels are patent. SMA: Patent. Renals: Single renal arteries are grossly patent. IMA: Patent. Inflow: Common, internal, and external iliac arteries are patent. Review of the MIP images confirms the above findings. NON-VASCULAR Hepatobiliary: There is reflux of iodinated contrast into the hepatic veins compatible with an element of elevated right heart pressures and right heart dysfunction. 1.1 cm calcified gallstone. Diffuse hepatic steatosis. Pancreas: Fatty replaced.  No obvious mass. Spleen: Mildly enlarged with a maximal diameter of 14 cm. Adrenals/Urinary Tract: Perinephric stranding is nonspecific. The adrenal glands are ill-defined. There is also significant stranding in an about the left adrenal gland of unknown significance. Bladder is unremarkable. Stomach/Bowel: Stomach is decompressed. No evidence of disproportionate dilatation of bowel to suggest obstruction. No obvious mass in the colon. Lymphatic: Several small para-aortic lymph nodes are scattered throughout the retroperitoneum. Reproductive: Uterus and adnexa are grossly within normal limits. Other: There is a small amount of free fluid about the liver, spleen, and pelvis. No free intraperitoneal gas. There is stranding throughout the intra-abdominal and subcutaneous fat likely related to  anasarca. Musculoskeletal: Degenerative disc disease at L4-5 is prominent. No vertebral compression deformity. Review of the MIP images confirms the above findings. IMPRESSION: No evidence of aortic dissection or intramural hematoma. Maximal diameter of the ascending aorta is 4.1 cm. Recommend annual imaging followup by CTA or MRA. This recommendation follows 2010 ACCF/AHA/AATS/ACR/ASA/SCA/SCAI/SIR/STS/SVM Guidelines for the Diagnosis and Management of Patients with Thoracic Aortic Disease. Circulation. 2010; 121: D924-Q683 Prominent aortic valvular calcifications. Consider echocardiogram to evaluate for pressure gradient. Mild mediastinal and hilar adenopathy.  This is nonspecific. 4 mm sub solid nodule in the right middle lobe. Celiac axis is ectatic with a maximal diameter of 1.2 cm. Contrast reflux into the hepatic veins suggesting an element of right heart dysfunction. Cholelithiasis. Anasarca. There is perinephric stranding as well as stranding obscuring the left adrenal gland. Again, this is most likely due to anasarca Fatty replacement of the pancreas Diffuse hepatic steatosis. Small amount of free fluid in the abdomen and pelvis. Electronically Signed   By: Marybelle Killings M.D.   On: 07/11/2017 19:06     Echo severe LV dysfunction, EF 25%, severe AS  TELEMETRY: Sinus rhythm, rate 60 bpm  ASSESSMENT AND PLAN:  Active Problems:   Wide-complex tachycardia ([place])    1. Wide-complex tachycardia, probable ventricular tachycardia, with known dilated cardiomyopathy, LVEF 25%. Patient has remained in sinus rhythm while on amiodarone drip. 2. Severe aortic stenosis 3. Elevated troponin, questionable for ischemia. History of elevated troponin.  Recommendations: 1.  The patient was discussed with Dr. Michiel Cowboy at [facility] who recommended proceeding with cardiac catheterization to delineate coronary anatomy prior to plan for TAVR. 2. Proceed with cardiac catheterization tomorrow morning. 3. Continue amiodarone  drip 4. Continue heparin drip in anticipation for cardiac catheterization in the morning    Clabe Seal, PA-C 07/12/2017 3:15 PM

## 2017-07-12 NOTE — Progress Notes (Signed)
Talked to Dr. Duane Boston about patient unable to void, bladder scanned and was 575. Per patient she usually take lasix at home and that help her to urinate. Order for one time dose of lasix 20 mg p.o and give it 2-3 hours and call MD again if patient have not urinate then and possible straight cath once. RN will continue to monitor.

## 2017-07-12 NOTE — Progress Notes (Signed)
[place] [facility] [place] NAME: [name]    MR#:  [id number]  DATE OF BIRTH:  05-06-1951  SUBJECTIVE:   Patient here due to palpitations and noted to have ventricular tachycardia. Now on amiodarone drip and no further episodes of ventricular tachycardia. Denies any chest pain, palpitations or shortness of breath.  REVIEW OF SYSTEMS:    Review of Systems  Constitutional: Negative for chills and fever.  HENT: Negative for congestion and tinnitus.   Eyes: Negative for blurred vision and double vision.  Respiratory: Negative for cough, shortness of breath and wheezing.   Cardiovascular: Negative for chest pain, orthopnea and PND.  Gastrointestinal: Negative for abdominal pain, diarrhea, nausea and vomiting.  Genitourinary: Negative for dysuria and hematuria.  Neurological: Negative for dizziness, sensory change and focal weakness.  All other systems reviewed and are negative.   Nutrition: Heart Healthy Tolerating Diet: Yes Tolerating PT: Await Eval.   DRUG ALLERGIES:   Allergies  Allergen Reactions  . Tetanus Toxoids Swelling  . Lisinopril Rash  . Niacin Rash  . Sitagliptin Rash  . Sulfamethoxazole-Trimethoprim Rash    VITALS:  Blood pressure 97/61, pulse 66, temperature 97.8 F (36.6 C), temperature source Axillary, resp. rate 19, height 5\' 5"  (1.651 m), weight (!) 151.7 kg (334 lb 7 oz), SpO2 97 %.  PHYSICAL EXAMINATION:   Physical Exam  GENERAL:  67 y.o.-year-old obese patient lying in bed in no acute distress.  EYES: Pupils equal, round, reactive to light and accommodation. No scleral icterus. Extraocular muscles intact.  HEENT: Head atraumatic, normocephalic. Oropharynx and nasopharynx clear.  NECK:  Supple, no jugular venous distention. No thyroid enlargement, no tenderness.  LUNGS: Poor Resp. effort, no wheezing, rales, rhonchi. No use of accessory muscles of respiration.  CARDIOVASCULAR: S1, S2 normal. No murmurs, rubs,  or gallops.  ABDOMEN: Soft, nontender, nondistended. Bowel sounds present. No organomegaly or mass.  EXTREMITIES: No cyanosis, clubbing, +1-2 edema b/l.    NEUROLOGIC: Cranial nerves II through XII are intact. No focal Motor or sensory deficits b/l.   PSYCHIATRIC: The patient is alert and oriented x 3.  SKIN: No obvious rash, lesion, or ulcer.    LABORATORY PANEL:   CBC Recent Labs  Lab 07/12/17 0437  WBC 11.6*  HGB 14.9  HCT 49.5*  PLT 159   ------------------------------------------------------------------------------------------------------------------  Chemistries  Recent Labs  Lab 07/12/17 0437  NA 138  K 5.2*  CL 107  CO2 17*  GLUCOSE 175*  BUN 43*  CREATININE 1.71*  CALCIUM 8.5*   ------------------------------------------------------------------------------------------------------------------  Cardiac Enzymes Recent Labs  Lab 07/12/17 0715  TROPONINI 3.44*   ------------------------------------------------------------------------------------------------------------------  RADIOLOGY:  Dg Chest Portable 1 View  Result Date: 07/11/2017 CLINICAL DATA:  Ventricular tachycardia.  Weakness. EXAM: PORTABLE CHEST 1 VIEW COMPARISON:  05/18/2017 FINDINGS: Pacemakers overlie the chest. Marked enlargement of the cardiac silhouette as seen previously. Venous hypertension without frank edema. No effusions. No acute bone finding. IMPRESSION: Redemonstration of markedly enlarged cardiac silhouette. Venous hypertension without frank edema. Electronically Signed   By: Nelson Chimes M.D.   On: 07/11/2017 18:11   Ct Angio Chest/abd/pel For Dissection W And/or Wo Contrast  Result Date: 07/11/2017 CLINICAL DATA:  V-tach.  Chest pain.  Acute coronary syndrome. EXAM: CT ANGIOGRAPHY CHEST, ABDOMEN AND PELVIS TECHNIQUE: Multidetector CT imaging through the chest, abdomen and pelvis was performed using the standard protocol during bolus administration of intravenous contrast.  Multiplanar reconstructed images and MIPs were obtained and reviewed to evaluate the vascular  anatomy. CONTRAST:  169mL ISOVUE-370 IOPAMIDOL (ISOVUE-370) INJECTION 76% COMPARISON:  None. FINDINGS: CTA CHEST FINDINGS Cardiovascular: There is no evidence of aortic dissection or intramural hematoma. A moderate pericardial effusion is noted which extends around the ascending aorta. Maximal diameter of the ascending aorta is 4.1 cm. Great vessels are grossly patent. There is no obvious evidence of acute pulmonary thromboembolism. Prominent aortic valvular calcifications are noted. Mediastinum/Nodes: Moderate pericardial effusion is noted. 1.3 cm precarinal lymph node. 1.5 cm short axis diameter prevascular lymph node on image 29. Other scattered small mediastinal nodes are noted. Prominent soft tissue in the right hilum is also worrisome for mild right hilar adenopathy. Mild soft tissue in the left hilum. Lungs/Pleura: No pneumothorax. No pleural effusion. Lungs are under aerated with scattered subsegmental atelectasis. 4 mm sub solid nodule in the right middle lobe on image 65. Musculoskeletal: No vertebral compression deformity. Review of the MIP images confirms the above findings. CTA ABDOMEN AND PELVIS FINDINGS VASCULAR Aorta: There is no evidence of aortic dissection or aneurysm. The aorta is patent. 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 Beyond the origin, the celiac axis is ectatic with a maximal diameter of 1.2 cm. Branch vessels are patent. SMA: Patent. Renals: Single renal arteries are grossly patent. IMA: Patent. Inflow: Common, internal, and external iliac arteries are patent. Review of the MIP images confirms the above findings. NON-VASCULAR Hepatobiliary: There is reflux of iodinated contrast into the hepatic veins compatible with an element of elevated right heart pressures and right heart dysfunction. 1.1 cm calcified gallstone. Diffuse hepatic steatosis.  Pancreas: Fatty replaced.  No obvious mass. Spleen: Mildly enlarged with a maximal diameter of 14 cm. Adrenals/Urinary Tract: Perinephric stranding is nonspecific. The adrenal glands are ill-defined. There is also significant stranding in an about the left adrenal gland of unknown significance. Bladder is unremarkable. Stomach/Bowel: Stomach is decompressed. No evidence of disproportionate dilatation of bowel to suggest obstruction. No obvious mass in the colon. Lymphatic: Several small para-aortic lymph nodes are scattered throughout the retroperitoneum. Reproductive: Uterus and adnexa are grossly within normal limits. Other: There is a small amount of free fluid about the liver, spleen, and pelvis. No free intraperitoneal gas. There is stranding throughout the intra-abdominal and subcutaneous fat likely related to anasarca. Musculoskeletal: Degenerative disc disease at L4-5 is prominent. No vertebral compression deformity. Review of the MIP images confirms the above findings. IMPRESSION: No evidence of aortic dissection or intramural hematoma. Maximal diameter of the ascending aorta is 4.1 cm. Recommend annual imaging followup by CTA or MRA. This recommendation follows 2010 ACCF/AHA/AATS/ACR/ASA/SCA/SCAI/SIR/STS/SVM Guidelines for the Diagnosis and Management of Patients with Thoracic Aortic Disease. Circulation. 2010; 121: W098-J191 Prominent aortic valvular calcifications. Consider echocardiogram to evaluate for pressure gradient. Mild mediastinal and hilar adenopathy.  This is nonspecific. 4 mm sub solid nodule in the right middle lobe. Celiac axis is ectatic with a maximal diameter of 1.2 cm. Contrast reflux into the hepatic veins suggesting an element of right heart dysfunction. Cholelithiasis. Anasarca. There is perinephric stranding as well as stranding obscuring the left adrenal gland. Again, this is most likely due to anasarca Fatty replacement of the pancreas Diffuse hepatic steatosis. Small amount of  free fluid in the abdomen and pelvis. Electronically Signed   By: Marybelle Killings M.D.   On: 07/11/2017 19:06     ASSESSMENT AND PLAN:   67 year old female with past medical history of severe aortic stenosis, obstructive sleep apnea, morbid obesity, hypertension, chronic lymphedema, hyperlipidemia, diastolic CHF,  diabetes, history of coronary artery disease who presents to the hospital due to chest pain or palpitations and noted to be in ventricular tachycardia.  1. Wide complex tachycardia-this was secondary to ventricular tachycardia. Patient has a history of severe dilated cardiomyopathy ejection fraction less than 25%. -Placed on amiodarone drip, and ventricular tachycardia has improved. -Appreciate cardiology input and continue current care and wean off amiodarone as tolerated   2. History of severe aortic stenosis-patient is followed by cardiology and they referred her to [facility] for possible TAVR.  - cont. Current care and need to discuss with Cardiology to see if she needs to be transferred while inpatient or as an outpatient.   3. DM type II w/out complication-continue Lantus, sliding scale insulin.  4. Hyperlipidemia-continue atorvastatin, Fenofibrate  All the records are reviewed and case discussed with Care Management/Social Worker. Management plans discussed with the patient, family and they are in agreement.  CODE STATUS: Full code  DVT Prophylaxis: Heparin gtt   TOTAL TIME TAKING CARE OF THIS PATIENT: 30 minutes.   POSSIBLE D/C IN 1-2 DAYS, DEPENDING ON CLINICAL CONDITION.   Henreitta Leber M.D on 07/12/2017 at 3:43 PM  Between 7am to 6pm - Pager - [phone]  After 6pm go to [email or web address] - [employer] [place] [facility]  Office  [phone]  CC: Primary care physician; Frazier Richards, MD

## 2017-07-12 NOTE — Progress Notes (Signed)
ANTICOAGULATION CONSULT NOTE - Initial Consult  Pharmacy Consult for heparin drip Indication: chest pain/ACS  Allergies  Allergen Reactions  . Tetanus Toxoids Swelling  . Lisinopril Rash  . Niacin Rash  . Sitagliptin Rash  . Sulfamethoxazole-Trimethoprim Rash    Patient Measurements: Height: 5\' 5"  (165.1 cm) Weight: (!) 334 lb 7 oz (151.7 kg) IBW/kg (Calculated) : 57 Heparin Dosing Weight: 94 kg  Vital Signs: Temp: 97.6 F (36.4 C) (01/17 0013) Temp Source: Oral (01/17 0013) BP: 124/83 (01/17 0230) Pulse Rate: 51 (01/16 2330)  Labs: Recent Labs    07/11/17 1734 07/12/17 0109  HGB 14.7  --   HCT 47.1*  --   PLT 196  --   HEPARINUNFRC  --  0.52  CREATININE 1.43*  --   TROPONINI 3.91* 3.86*    Estimated Creatinine Clearance: 58 mL/min (A) (by C-G formula based on SCr of 1.43 mg/dL (H)).   Medical History: Past Medical History:  Diagnosis Date  . Bacteremia   . CAD (coronary artery disease)   . Diabetes mellitus without complication ([place])   . Diastolic CHF ([place])   . Hyperlipemia   . Hypertension   . Lymphedema   . MI (myocardial infarction) ([place])   . OSA (obstructive sleep apnea)   . Severe aortic stenosis    Assessment: Heparin drip ordered by MD for ACS/NSTEMI. Per protocol, pharmacy to dose/monitor consult entered. Patient was not on anticoagulants prior to admission. Baseline labs ordered.  Goal of Therapy:  Heparin level 0.3-0.7 units/ml Monitor platelets by anticoagulation protocol: Yes   Plan:  4000 units bolus ordered by MD Start heparin infusion at 1200 units/hr (~13 units/kg/hr per protocol) Check anti-Xa level in 6 hours and daily while on heparin Continue to monitor H&H and platelets  01/17 @ 0100 HL 0.52 therapeutic. Will continue current regimen and will recheck HL @ 0700.  Tobie Lords, PharmD, BCPS Clinical Pharmacist 07/12/2017,3:17 AM

## 2017-07-12 NOTE — Progress Notes (Signed)
ANTICOAGULATION CONSULT NOTE - Follow Up Consult  Pharmacy Consult for heparin drip. Indication: chest pain/ACS    Assessment: Heparin drip ordered by MD for ACS/NSTEMI. Patient was not on anticoagulation prior to admission.   Goal of Therapy:  Heparin level 0.3-0.7 units/ml. Monitor platelets by anticoagulation protocol: Yes.    Plan:  Continue heparin drip at 1200 units/hr (~13 units/kg/hr). Check anti-Xa levels tomorrow with AM labs. Continue to monitor H&H and platelets.  01/17 @ 0100 HL 0.52 therapeutic. 01/17 @ 0715 HL 0.37 therapeutic.  Pharmacy will continue to monitor per consult.   Allergies  Allergen Reactions  . Tetanus Toxoids Swelling  . Lisinopril Rash  . Niacin Rash  . Sitagliptin Rash  . Sulfamethoxazole-Trimethoprim Rash    Patient Measurements: Height: 5\' 5"  (165.1 cm) Weight: (!) 334 lb 7 oz (151.7 kg) IBW/kg (Calculated) : 57 Heparin Dosing Weight: 95.4 kg  Vital Signs: Temp: 97.8 F (36.6 C) (01/17 0700) Temp Source: Axillary (01/17 0700) BP: 121/92 (01/17 0900) Pulse Rate: 62 (01/17 0900)  Labs: Recent Labs    07/11/17 1734 07/12/17 0109 07/12/17 0437 07/12/17 0715  HGB 14.7  --  14.9  --   HCT 47.1*  --  49.5*  --   PLT 196  --  159  --   APTT  --  124*  --   --   LABPROT  --  21.9*  --   --   INR  --  1.93  --   --   HEPARINUNFRC  --  0.52  --  0.37  CREATININE 1.43*  --  1.71*  --   TROPONINI 3.91* 3.86*  --  3.44*    Estimated Creatinine Clearance: 48.5 mL/min (A) (by C-G formula based on SCr of 1.71 mg/dL (H)).   Medications:  Medications Prior to Admission  Medication Sig Dispense Refill Last Dose  . aspirin EC 81 MG tablet Take 81 mg by mouth daily.   07/11/2017 at Unknown time  . atorvastatin (LIPITOR) 40 MG tablet Take 1 tablet (40 mg total) by mouth daily at 6 PM. 30 tablet 0 07/10/2017 at Unknown time  . cephALEXin (KEFLEX) 500 MG capsule Take 1 capsule by mouth daily.   07/11/2017 at Unknown time  . fenofibrate  (TRICOR) 145 MG tablet Take 1 tablet by mouth daily.   07/11/2017 at Unknown time  . furosemide (LASIX) 40 MG tablet Take 1 tablet (40 mg total) by mouth daily. 30 tablet 0 07/11/2017 at Unknown time  . LANTUS SOLOSTAR 100 UNIT/ML Solostar Pen Inject 50 Units into the skin daily. Titrate for morning sugars less than 120   07/11/2017 at Unknown time  . metFORMIN (GLUCOPHAGE) 1000 MG tablet Take 1 tablet (1,000 mg total) by mouth daily with breakfast.   07/11/2017 at Unknown time  . metoprolol tartrate (LOPRESSOR) 25 MG tablet Take 1 tablet (25 mg total) by mouth 2 (two) times daily. 60 tablet 0 07/11/2017 at Unknown time  . potassium chloride (K-DUR) 10 MEQ tablet Take 1 tablet by mouth daily.   07/11/2017 at Unknown time  . nystatin (MYCOSTATIN/NYSTOP) powder Apply topically 4 (four) times daily.    PRN at PRN  . triamcinolone cream (KENALOG) 0.1 % Apply 1 application topically daily.   PRN at PRN    [place] Add Dinapoli 07/12/2017,9:21 AM

## 2017-07-12 NOTE — Progress Notes (Signed)
eLink Physician-Brief Progress Note Patient Name: [name] DOB: Mar 30, 1951 MRN: [id number]   Date of Service  07/12/2017  HPI/Events of Note  60 F with MMP including severe AS, CHF, OSA presenting to ED with wide complex tachycardia.  Adenosine given x 2 without change in rhythm.  Started on Amio bolus/gtt with reduction in rate to SB.  Was initially hypotensive but this improved with rhythm change but remained hypotensive enough to require NE gtt for BP support.  Now on 12 mcg of NE.  On camera check the patient is resting comfortably with RR of 18 and O2 sats of 98%.  BP is 126/92 (104) with HR of 54  Initial trop greater than 3.0  eICU Interventions  Plan of care per primary admitting team Cardiology consulted and has seen patient who is well known to this service. PCCM to see at bedside Continue to cycle trop Monitor via [facility]     Intervention Category Evaluation Type: New Patient Evaluation  Aleia Larocca 07/12/2017, 12:34 AM

## 2017-07-12 NOTE — Plan of Care (Signed)
  Progressing Education: Knowledge of General Education information will improve 07/12/2017 0520 - Progressing by Morene Crocker, RN Health Behavior/Discharge Planning: Ability to manage health-related needs will improve 07/12/2017 0520 - Progressing by Morene Crocker, RN Clinical Measurements: Ability to maintain clinical measurements within normal limits will improve 07/12/2017 0520 - Progressing by Morene Crocker, RN Will remain free from infection 07/12/2017 0520 - Progressing by Morene Crocker, RN Diagnostic test results will improve 07/12/2017 0520 - Progressing by Morene Crocker, RN Respiratory complications will improve 07/12/2017 0520 - Progressing by Morene Crocker, RN Cardiovascular complication will be avoided 07/12/2017 0520 - Progressing by Morene Crocker, RN Activity: Risk for activity intolerance will decrease 07/12/2017 0520 - Progressing by Morene Crocker, RN Nutrition: Adequate nutrition will be maintained 07/12/2017 0520 - Progressing by Morene Crocker, RN Coping: Level of anxiety will decrease 07/12/2017 0520 - Progressing by Morene Crocker, RN Elimination: Will not experience complications related to bowel motility 07/12/2017 0520 - Progressing by Morene Crocker, RN Will not experience complications related to urinary retention 07/12/2017 0520 - Progressing by Morene Crocker, RN Pain Managment: General experience of comfort will improve 07/12/2017 0520 - Progressing by Morene Crocker, RN Safety: Ability to remain free from injury will improve 07/12/2017 0520 - Progressing by Morene Crocker, RN Skin Integrity: Risk for impaired skin integrity will decrease 07/12/2017 0520 - Progressing by Morene Crocker, RN

## 2017-07-12 NOTE — Progress Notes (Signed)
Patient arrived at the unit Amiodarone drip 16.7 ml/hr. Heparin drip at 12 ml/her. RN will continue to monitor.

## 2017-07-13 ENCOUNTER — Encounter: Payer: Self-pay | Admitting: *Deleted

## 2017-07-13 ENCOUNTER — Encounter
Admission: EM | Disposition: A | Discharge status: Skilled Nursing Facility | Payer: Self-pay | Source: Home / Self Care | Attending: Internal Medicine

## 2017-07-13 ENCOUNTER — Inpatient Hospital Stay
Admit: 2017-07-13 | Discharge: 2017-07-13 | Disposition: A | Discharge status: Home / Self Care | Payer: Medicare Other | Attending: Pulmonary Disease | Admitting: Pulmonary Disease

## 2017-07-13 LAB — CBC
HCT: 42.5 % (ref 35.0–47.0)
HEMOGLOBIN: 13.3 g/dL (ref 12.0–16.0)
MCH: 26.6 pg (ref 26.0–34.0)
MCHC: 31.2 g/dL — ABNORMAL LOW (ref 32.0–36.0)
MCV: 85.3 fL (ref 80.0–100.0)
PLATELETS: 157 10*3/uL (ref 150–440)
RBC: 4.99 MIL/uL (ref 3.80–5.20)
RDW: 17.6 % — ABNORMAL HIGH (ref 11.5–14.5)
WBC: 6.9 10*3/uL (ref 3.6–11.0)

## 2017-07-13 LAB — BASIC METABOLIC PANEL
ANION GAP: 10 (ref 5–15)
BUN: 51 mg/dL — AB (ref 6–20)
CALCIUM: 8.4 mg/dL — AB (ref 8.9–10.3)
CO2: 18 mmol/L — ABNORMAL LOW (ref 22–32)
Chloride: 107 mmol/L (ref 101–111)
Creatinine, Ser: 1.68 mg/dL — ABNORMAL HIGH (ref 0.44–1.00)
GFR calc Af Amer: 36 mL/min — ABNORMAL LOW (ref >60)
GFR, EST NON AFRICAN AMERICAN: 31 mL/min — AB (ref >60)
Glucose, Bld: 168 mg/dL — ABNORMAL HIGH (ref 65–99)
POTASSIUM: 4.6 mmol/L (ref 3.5–5.1)
SODIUM: 135 mmol/L (ref 135–145)

## 2017-07-13 LAB — ECHOCARDIOGRAM COMPLETE
HEIGHTINCHES: 65 in
Weight: 5344 oz

## 2017-07-13 LAB — GLUCOSE, CAPILLARY
GLUCOSE-CAPILLARY: 145 mg/dL — AB (ref 65–99)
Glucose-Capillary: 153 mg/dL — ABNORMAL HIGH (ref 65–99)
Glucose-Capillary: 165 mg/dL — ABNORMAL HIGH (ref 65–99)
Glucose-Capillary: 194 mg/dL — ABNORMAL HIGH (ref 65–99)

## 2017-07-13 LAB — HEPARIN LEVEL (UNFRACTIONATED): HEPARIN UNFRACTIONATED: 0.23 [IU]/mL — AB (ref 0.30–0.70)

## 2017-07-13 SURGERY — LEFT HEART CATH AND CORONARY ANGIOGRAPHY
Anesthesia: Moderate Sedation

## 2017-07-13 MED ORDER — BISACODYL 10 MG RE SUPP: 10.0000 mg | Freq: Every day | RECTAL | Status: DC | PRN

## 2017-07-13 MED ORDER — ADENOSINE 12 MG/4ML IV SOLN: 12.0000 mg | Freq: Once | INTRAVENOUS | Status: DC

## 2017-07-13 MED ORDER — HEPARIN BOLUS VIA INFUSION
1400.0000 [IU] | Freq: Once | INTRAVENOUS | Status: AC
  Administered 2017-07-13: 1400 [IU] via INTRAVENOUS
  Filled 2017-07-13: qty 1400

## 2017-07-13 MED ORDER — METOPROLOL TARTRATE 5 MG/5ML IV SOLN
2.5000 mg | INTRAVENOUS | Status: AC | PRN
  Administered 2017-07-13 (×2): 2.5 mg via INTRAVENOUS

## 2017-07-13 MED ORDER — AMIODARONE HCL 200 MG PO TABS
400.0000 mg | ORAL_TABLET | Freq: Two times a day (BID) | ORAL | Status: DC
  Administered 2017-07-13 – 2017-07-16 (×7): 400 mg via ORAL
  Filled 2017-07-13 (×7): qty 2

## 2017-07-13 MED ORDER — METOPROLOL TARTRATE 5 MG/5ML IV SOLN
INTRAVENOUS | Status: AC
  Administered 2017-07-13: 2.5 mg via INTRAVENOUS
  Filled 2017-07-13: qty 5

## 2017-07-13 MED ORDER — ADENOSINE 6 MG/2ML IV SOLN: 6.0000 mg | Freq: Once | INTRAVENOUS | Status: DC

## 2017-07-13 MED ORDER — ADENOSINE 6 MG/2ML IV SOLN
INTRAVENOUS | Status: AC
  Filled 2017-07-13: qty 2

## 2017-07-13 MED ORDER — DOCUSATE SODIUM 100 MG PO CAPS
100.0000 mg | ORAL_CAPSULE | Freq: Two times a day (BID) | ORAL | Status: DC
  Administered 2017-07-13 – 2017-07-16 (×6): 100 mg via ORAL
  Filled 2017-07-13 (×8): qty 1

## 2017-07-13 MED ORDER — METOPROLOL TARTRATE 25 MG PO TABS
12.5000 mg | ORAL_TABLET | Freq: Four times a day (QID) | ORAL | Status: DC
  Administered 2017-07-13 – 2017-07-16 (×6): 12.5 mg via ORAL
  Filled 2017-07-13 (×6): qty 0.5
  Filled 2017-07-13: qty 1
  Filled 2017-07-13: qty 0.5
  Filled 2017-07-13 (×3): qty 1
  Filled 2017-07-13: qty 0.5
  Filled 2017-07-13 (×2): qty 1
  Filled 2017-07-13 (×7): qty 0.5
  Filled 2017-07-13: qty 1

## 2017-07-13 NOTE — Progress Notes (Signed)
*  PRELIMINARY RESULTS* Echocardiogram 2D Echocardiogram has been performed.  Sherrie Sport 07/13/2017, 2:48 PM

## 2017-07-13 NOTE — Progress Notes (Signed)
ANTICOAGULATION CONSULT NOTE - Initial Consult  Pharmacy Consult for heparin drip Indication: chest pain/ACS  Allergies  Allergen Reactions  . Tetanus Toxoids Swelling  . Lisinopril Rash  . Niacin Rash  . Sitagliptin Rash  . Sulfamethoxazole-Trimethoprim Rash    Patient Measurements: Height: 5\' 5"  (165.1 cm) Weight: (!) 334 lb 1.6 oz (151.5 kg) IBW/kg (Calculated) : 57 Heparin Dosing Weight: 94 kg  Vital Signs: Temp: 98.3 F (36.8 C) (01/18 0336) Temp Source: Oral (01/18 0336) BP: 110/70 (01/18 0336) Pulse Rate: 73 (01/18 0336)  Labs: Recent Labs    07/11/17 1734 07/12/17 0109 07/12/17 0437 07/12/17 0715 07/13/17 0707  HGB 14.7  --  14.9  --  13.3  HCT 47.1*  --  49.5*  --  42.5  PLT 196  --  159  --  157  APTT  --  124*  --   --   --   LABPROT  --  21.9*  --   --   --   INR  --  1.93  --   --   --   HEPARINUNFRC  --  0.52  --  0.37 0.23*  CREATININE 1.43*  --  1.71*  --  1.68*  TROPONINI 3.91* 3.86*  --  3.44*  --     Estimated Creatinine Clearance: 49.3 mL/min (A) (by C-G formula based on SCr of 1.68 mg/dL (H)).   Medical History: Past Medical History:  Diagnosis Date  . Bacteremia   . CAD (coronary artery disease)   . Diabetes mellitus without complication ([place])   . Diastolic CHF ([place])   . Hyperlipemia   . Hypertension   . Lymphedema   . MI (myocardial infarction) ([place])   . OSA (obstructive sleep apnea)   . Severe aortic stenosis    Assessment: Heparin drip ordered by MD for ACS/NSTEMI. Per protocol, pharmacy to dose/monitor consult entered. Patient was not on anticoagulants prior to admission. Baseline labs ordered.  Goal of Therapy:  Heparin level 0.3-0.7 units/ml Monitor platelets by anticoagulation protocol: Yes   Plan:  4000 units bolus ordered by MD Start heparin infusion at 1200 units/hr (~13 units/kg/hr per protocol) Check anti-Xa level in 6 hours and daily while on heparin Continue to monitor H&H and platelets  01/17 @ 0100 HL  0.52 therapeutic. Will continue current regimen and will recheck HL @ 0700.  01/18 @ 0830 HL 0.23. Bolus heparin 1400 units IV and increase infusion to 1400 units/hr. Will recheck a HL in 6 hours and/or f/u after cath.   Napoleon Form, PharmD, BCPS Clinical Pharmacist 07/13/2017,8:30 AM

## 2017-07-13 NOTE — Progress Notes (Signed)
[facility] [place] NAME: [name]    MR#:  [id number]  DATE OF BIRTH:  01-01-1951  SUBJECTIVE:   Plan was to do a cardiac catheterization today prior to her having her TAVR but it was canceled as patient went into SVT again this morning. Patient received some IV doses of metoprolol and given a amiodarone bolus and has converted back to a normal sinus rhythm. No other complaints other than some mild constipation.  REVIEW OF SYSTEMS:    Review of Systems  Constitutional: Negative for chills and fever.  HENT: Negative for congestion and tinnitus.   Eyes: Negative for blurred vision and double vision.  Respiratory: Negative for cough, shortness of breath and wheezing.   Cardiovascular: Negative for chest pain, orthopnea and PND.  Gastrointestinal: Positive for constipation. Negative for abdominal pain, diarrhea, nausea and vomiting.  Genitourinary: Negative for dysuria and hematuria.  Neurological: Negative for dizziness, sensory change and focal weakness.  All other systems reviewed and are negative.   Nutrition: Heart Healthy Tolerating Diet: Yes Tolerating PT: Await Eval.   DRUG ALLERGIES:   Allergies  Allergen Reactions  . Tetanus Toxoids Swelling  . Lisinopril Rash  . Niacin Rash  . Sitagliptin Rash  . Sulfamethoxazole-Trimethoprim Rash    VITALS:  Blood pressure 96/67, pulse (!) 51, temperature 97.8 F (36.6 C), temperature source Axillary, resp. rate 20, height 5\' 5"  (1.651 m), weight (!) 151.5 kg (334 lb), SpO2 95 %.  PHYSICAL EXAMINATION:   Physical Exam  GENERAL:  67 y.o.-year-old obese patient lying in bed in no acute distress.  EYES: Pupils equal, round, reactive to light and accommodation. No scleral icterus. Extraocular muscles intact.  HEENT: Head atraumatic, normocephalic. Oropharynx and nasopharynx clear.  NECK:  Supple, no jugular venous distention. No thyroid enlargement, no tenderness.  LUNGS: Poor  Resp. effort, no wheezing, rales, rhonchi. No use of accessory muscles of respiration.  CARDIOVASCULAR: S1, S2 normal. No murmurs, rubs, or gallops.  ABDOMEN: Soft, nontender, nondistended. Bowel sounds present. No organomegaly or mass.  EXTREMITIES: No cyanosis, clubbing, +1-2 edema b/l.    NEUROLOGIC: Cranial nerves II through XII are intact. No focal Motor or sensory deficits b/l.   PSYCHIATRIC: The patient is alert and oriented x 3.  SKIN: No obvious rash, lesion, or ulcer.    LABORATORY PANEL:   CBC Recent Labs  Lab 07/13/17 0707  WBC 6.9  HGB 13.3  HCT 42.5  PLT 157   ------------------------------------------------------------------------------------------------------------------  Chemistries  Recent Labs  Lab 07/13/17 0707  NA 135  K 4.6  CL 107  CO2 18*  GLUCOSE 168*  BUN 51*  CREATININE 1.68*  CALCIUM 8.4*   ------------------------------------------------------------------------------------------------------------------  Cardiac Enzymes Recent Labs  Lab 07/12/17 0715  TROPONINI 3.44*   ------------------------------------------------------------------------------------------------------------------  RADIOLOGY:  Dg Chest Portable 1 View  Result Date: 07/11/2017 CLINICAL DATA:  Ventricular tachycardia.  Weakness. EXAM: PORTABLE CHEST 1 VIEW COMPARISON:  05/18/2017 FINDINGS: Pacemakers overlie the chest. Marked enlargement of the cardiac silhouette as seen previously. Venous hypertension without frank edema. No effusions. No acute bone finding. IMPRESSION: Redemonstration of markedly enlarged cardiac silhouette. Venous hypertension without frank edema. Electronically Signed   By: Nelson Chimes M.D.   On: 07/11/2017 18:11   Ct Angio Chest/abd/pel For Dissection W And/or Wo Contrast  Result Date: 07/11/2017 CLINICAL DATA:  V-tach.  Chest pain.  Acute coronary syndrome. EXAM: CT ANGIOGRAPHY CHEST, ABDOMEN AND PELVIS TECHNIQUE: Multidetector CT imaging through  the chest, abdomen and  pelvis was performed using the standard protocol during bolus administration of intravenous contrast. Multiplanar reconstructed images and MIPs were obtained and reviewed to evaluate the vascular anatomy. CONTRAST:  140mL ISOVUE-370 IOPAMIDOL (ISOVUE-370) INJECTION 76% COMPARISON:  None. FINDINGS: CTA CHEST FINDINGS Cardiovascular: There is no evidence of aortic dissection or intramural hematoma. A moderate pericardial effusion is noted which extends around the ascending aorta. Maximal diameter of the ascending aorta is 4.1 cm. Great vessels are grossly patent. There is no obvious evidence of acute pulmonary thromboembolism. Prominent aortic valvular calcifications are noted. Mediastinum/Nodes: Moderate pericardial effusion is noted. 1.3 cm precarinal lymph node. 1.5 cm short axis diameter prevascular lymph node on image 29. Other scattered small mediastinal nodes are noted. Prominent soft tissue in the right hilum is also worrisome for mild right hilar adenopathy. Mild soft tissue in the left hilum. Lungs/Pleura: No pneumothorax. No pleural effusion. Lungs are under aerated with scattered subsegmental atelectasis. 4 mm sub solid nodule in the right middle lobe on image 65. Musculoskeletal: No vertebral compression deformity. Review of the MIP images confirms the above findings. CTA ABDOMEN AND PELVIS FINDINGS VASCULAR Aorta: There is no evidence of aortic dissection or aneurysm. The aorta is patent. 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 Beyond the origin, the celiac axis is ectatic with a maximal diameter of 1.2 cm. Branch vessels are patent. SMA: Patent. Renals: Single renal arteries are grossly patent. IMA: Patent. Inflow: Common, internal, and external iliac arteries are patent. Review of the MIP images confirms the above findings. NON-VASCULAR Hepatobiliary: There is reflux of iodinated contrast into the hepatic veins compatible with an  element of elevated right heart pressures and right heart dysfunction. 1.1 cm calcified gallstone. Diffuse hepatic steatosis. Pancreas: Fatty replaced.  No obvious mass. Spleen: Mildly enlarged with a maximal diameter of 14 cm. Adrenals/Urinary Tract: Perinephric stranding is nonspecific. The adrenal glands are ill-defined. There is also significant stranding in an about the left adrenal gland of unknown significance. Bladder is unremarkable. Stomach/Bowel: Stomach is decompressed. No evidence of disproportionate dilatation of bowel to suggest obstruction. No obvious mass in the colon. Lymphatic: Several small para-aortic lymph nodes are scattered throughout the retroperitoneum. Reproductive: Uterus and adnexa are grossly within normal limits. Other: There is a small amount of free fluid about the liver, spleen, and pelvis. No free intraperitoneal gas. There is stranding throughout the intra-abdominal and subcutaneous fat likely related to anasarca. Musculoskeletal: Degenerative disc disease at L4-5 is prominent. No vertebral compression deformity. Review of the MIP images confirms the above findings. IMPRESSION: No evidence of aortic dissection or intramural hematoma. Maximal diameter of the ascending aorta is 4.1 cm. Recommend annual imaging followup by CTA or MRA. This recommendation follows 2010 ACCF/AHA/AATS/ACR/ASA/SCA/SCAI/SIR/STS/SVM Guidelines for the Diagnosis and Management of Patients with Thoracic Aortic Disease. Circulation. 2010; 121: B716-R678 Prominent aortic valvular calcifications. Consider echocardiogram to evaluate for pressure gradient. Mild mediastinal and hilar adenopathy.  This is nonspecific. 4 mm sub solid nodule in the right middle lobe. Celiac axis is ectatic with a maximal diameter of 1.2 cm. Contrast reflux into the hepatic veins suggesting an element of right heart dysfunction. Cholelithiasis. Anasarca. There is perinephric stranding as well as stranding obscuring the left adrenal  gland. Again, this is most likely due to anasarca Fatty replacement of the pancreas Diffuse hepatic steatosis. Small amount of free fluid in the abdomen and pelvis. Electronically Signed   By: Marybelle Killings M.D.   On: 07/11/2017 19:06  ASSESSMENT AND PLAN:   67 year old female with past medical history of severe aortic stenosis, obstructive sleep apnea, morbid obesity, hypertension, chronic lymphedema, hyperlipidemia, diastolic CHF, diabetes, history of coronary artery disease who presents to the hospital due to chest pain or palpitations and noted to be in ventricular tachycardia.  1. Wide complex tachycardia- this is SVT with aberrancy as per cardiology. There was some concern for ventricular tachycardia but cardiology as discussed with EP physician and they think this is SVT with aberrancy. -Patient developed this preoperatively today prior to cardiac catheterization and was given 2 doses of IV metoprolol and amiodarone bolus and has not converted to normal sinus rhythm. -Plan is to load the patient with oral amiodarone and watch for any further arrhythmias. -Patient has a history of severe dilated cardiomyopathy ejection fraction less than 25%.   2. History of severe aortic stenosis-patient is to have a TAVR and Cardiology discussed w/ Dr. Michiel Cowboy who recommended to due Cardiac catheterization prior to TAVR but could not get it done today due to SVT.  - cont. Supportive care as mentioned above with B-blocker, Amio for SVT and plan for possible Cardiac Catheterization on Monday.  - discussed w/ Cardiology.   3. DM type II w/out complication-continue Lantus, sliding scale insulin.  4. Hyperlipidemia-continue atorvastatin, Fenofibrate  5. Constipation - started on some Colace and also Dulculoax PRN.   All the records are reviewed and case discussed with Care Management/Social Worker. Management plans discussed with the patient, family and they are in agreement.  CODE STATUS: Full  code  DVT Prophylaxis: Heparin gtt   TOTAL TIME TAKING CARE OF THIS PATIENT: 30 minutes.   POSSIBLE D/C IN 2-3 DAYS, DEPENDING ON CLINICAL CONDITION.   Henreitta Leber M.D on 07/13/2017 at 2:09 PM  Between 7am to 6pm - Pager - [phone]  After 6pm go to [email or web address] - [employer] [facility]  Office  [phone]  CC: Primary care physician; Frazier Richards, MD

## 2017-07-13 NOTE — Progress Notes (Signed)
Pt arrived to [facility] at 0855, complaining of heart palpitations. Tele box checked showing HR 170 SVT. Pt placed on [facility] monitor, and 12 lead obtained showing wide QRS tachycardia hr 165-170. Zoll pads placed on Pt. Pt advised to bear down attempting to bring down heart rate. Dr. Saralyn Pilar paged and updated. 2.5 mg lopressor ordered x 2. NS started at bolus rate ordered Pre Cath 455 ml/hr. Lopresor given as ordered with drop in BP 79/57 after first dose 0923 , second dose given at 0935 BP 103/81. Dr. Saralyn Pilar at bedside 0945 to assess Patient. Adenosine ordered  While preparing drugs Pt converted to SB EKG done showing HR 40s atrial bradycardia. Dr.Paraschos at bedside 12 lead reviewed. Adenosine canceled. Per MD Pt ok to return to floor. Heparin d/c and metoprolol  Po ordered. Report provided to floor [place]

## 2017-07-13 NOTE — Plan of Care (Signed)
Patient has been up ambulating to the bathroom with one assist. Patient is scheduled for cardiac catherization today. Patient is in agreement with plan of care. Patient is currently on Amiodarone drip without any complications.

## 2017-07-14 LAB — BASIC METABOLIC PANEL
ANION GAP: 8 (ref 5–15)
BUN: 49 mg/dL — ABNORMAL HIGH (ref 6–20)
CO2: 21 mmol/L — AB (ref 22–32)
Calcium: 8.6 mg/dL — ABNORMAL LOW (ref 8.9–10.3)
Chloride: 107 mmol/L (ref 101–111)
Creatinine, Ser: 1.47 mg/dL — ABNORMAL HIGH (ref 0.44–1.00)
GFR calc Af Amer: 42 mL/min — ABNORMAL LOW (ref >60)
GFR, EST NON AFRICAN AMERICAN: 36 mL/min — AB (ref >60)
Glucose, Bld: 151 mg/dL — ABNORMAL HIGH (ref 65–99)
POTASSIUM: 5 mmol/L (ref 3.5–5.1)
Sodium: 136 mmol/L (ref 135–145)

## 2017-07-14 LAB — GLUCOSE, CAPILLARY
GLUCOSE-CAPILLARY: 184 mg/dL — AB (ref 65–99)
GLUCOSE-CAPILLARY: 180 mg/dL — AB (ref 65–99)
Glucose-Capillary: 109 mg/dL — ABNORMAL HIGH (ref 65–99)
Glucose-Capillary: 215 mg/dL — ABNORMAL HIGH (ref 65–99)

## 2017-07-14 LAB — CBC
HCT: 43.1 % (ref 35.0–47.0)
HEMOGLOBIN: 13.6 g/dL (ref 12.0–16.0)
MCH: 26.8 pg (ref 26.0–34.0)
MCHC: 31.6 g/dL — AB (ref 32.0–36.0)
MCV: 84.7 fL (ref 80.0–100.0)
PLATELETS: 160 10*3/uL (ref 150–440)
RBC: 5.08 MIL/uL (ref 3.80–5.20)
RDW: 17.2 % — ABNORMAL HIGH (ref 11.5–14.5)
WBC: 8.3 10*3/uL (ref 3.6–11.0)

## 2017-07-14 MED ORDER — ENOXAPARIN SODIUM 40 MG/0.4ML ~~LOC~~ SOLN
40.0000 mg | Freq: Two times a day (BID) | SUBCUTANEOUS | Status: DC
  Administered 2017-07-14 – 2017-07-16 (×5): 40 mg via SUBCUTANEOUS
  Filled 2017-07-14 (×5): qty 0.4

## 2017-07-14 MED ORDER — FUROSEMIDE 40 MG PO TABS
40.0000 mg | ORAL_TABLET | Freq: Every day | ORAL | Status: DC
Start: 2017-07-14 — End: 2017-07-16
  Administered 2017-07-14 – 2017-07-16 (×3): 40 mg via ORAL
  Filled 2017-07-14 (×3): qty 1

## 2017-07-14 MED ORDER — ENOXAPARIN SODIUM 40 MG/0.4ML ~~LOC~~ SOLN: 40.0000 mg | SUBCUTANEOUS | Status: DC

## 2017-07-14 NOTE — Evaluation (Signed)
Physical Therapy Evaluation Patient Details Name: [name] MRN: [id number] DOB: 13-Oct-1950 Today's Date: 07/14/2017   History of Present Illness  67 y.o. female with a known history of severe aortic stenosis, chronic diastolic CHF, hypertension waiting for TAVR at [facility] presents to the emergency room with shortness of breath, dizziness with wide-complex tachycardia  Clinical Impression  Pt struggled with most aspects of mobility needing excessive rest breaks and encouragement to be able to perform requested mobility/activity.  Pt unable to get to sitting EOB (reports she can do this at home w/o rails independently) she needed cuing, assist and heavy UE use of rails to rise to standing.  Educated on mobility and transfers in addition to assessment.  Pt weak, quick to fatigue and overall not at her baseline and needing rehab before being safe at home.     Follow Up Recommendations SNF    Equipment Recommendations       Recommendations for Other Services       Precautions / Restrictions Precautions Precautions: Fall Restrictions Weight Bearing Restrictions: No      Mobility  Bed Mobility Overal bed mobility: Needs Assistance Bed Mobility: Supine to Sit     Supine to sit: Mod assist     General bed mobility comments: Pt made good effort to get to EOB w/o assist, but becomes exquisitly fatigued, short of breath and almost panicked with the effort  Transfers Overall transfer level: Needs assistance Equipment used: Rolling walker (2 wheeled) Transfers: Sit to/from Stand Sit to Stand: Min assist         General transfer comment: Once pt was able to catch her composure (HR remained 50s-70s and O2 in the high 90s) she was able to rise with only very minimal assist   Ambulation/Gait Ambulation/Gait assistance: Min guard Ambulation Distance (Feet): 25 Feet Assistive device: Rolling walker (2 wheeled)       General Gait Details: Pt was able to ambulate minimally in  the room, but fatigued very quickly, generally was impulsive and unsteady and ultimately was not near her baseline or overly confident  Stairs            Wheelchair Mobility    Modified Rankin (Stroke Patients Only)       Balance Overall balance assessment: Needs assistance   Sitting balance-Leahy Scale: Good       Standing balance-Leahy Scale: Fair                               Pertinent Vitals/Pain Pain Assessment: No/denies pain    Home Living Family/patient expects to be discharged to:: Skilled nursing facility Living Arrangements: Alone                    Prior Function Level of Independence: Independent with assistive device(s)         Comments: Pt only out of the house for MD appointments, reports she is essentially unable to walk more than ~100 ft at a time     Hand Dominance        Extremity/Trunk Assessment   Upper Extremity Assessment Upper Extremity Assessment: Generalized weakness    Lower Extremity Assessment Lower Extremity Assessment: Generalized weakness       Communication   Communication: No difficulties  Cognition Arousal/Alertness: Awake/alert Behavior During Therapy: WFL for tasks assessed/performed Overall Cognitive Status: Within Functional Limits for tasks assessed  General Comments      Exercises     Assessment/Plan    PT Assessment Patient needs continued PT services  PT Problem List Decreased strength;Decreased activity tolerance;Decreased balance;Decreased mobility;Decreased knowledge of use of DME;Decreased safety awareness;Cardiopulmonary status limiting activity;Obesity       PT Treatment Interventions DME instruction;Gait training;Stair training;Functional mobility training;Therapeutic activities;Therapeutic exercise;Balance training;Neuromuscular re-education;Patient/family education    PT Goals (Current goals can be found in the  Care Plan section)  Acute Rehab PT Goals Patient Stated Goal: get stronger PT Goal Formulation: With patient Time For Goal Achievement: 07/28/17 Potential to Achieve Goals: Good    Frequency Min 2X/week   Barriers to discharge        Co-evaluation               AM-PAC PT "6 Clicks" Daily Activity  Outcome Measure Difficulty turning over in bed (including adjusting bedclothes, sheets and blankets)?: A Lot Difficulty moving from lying on back to sitting on the side of the bed? : Unable Difficulty sitting down on and standing up from a chair with arms (e.g., wheelchair, bedside commode, etc,.)?: Unable Help needed moving to and from a bed to chair (including a wheelchair)?: A Little Help needed walking in hospital room?: A Lot Help needed climbing 3-5 steps with a railing? : A Lot 6 Click Score: 11    End of Session Equipment Utilized During Treatment: Gait belt Activity Tolerance: Patient limited by fatigue Patient left: with chair alarm set;with call bell/phone within reach Nurse Communication: Mobility status PT Visit Diagnosis: Muscle weakness (generalized) (M62.81);Difficulty in walking, not elsewhere classified (R26.2)    Time: 1771-1657 PT Time Calculation (min) (ACUTE ONLY): 32 min   Charges:   PT Evaluation $PT Eval Low Complexity: 1 Low PT Treatments $Therapeutic Activity: 8-22 mins   PT G Codes:        Kreg Shropshire, DPT 07/14/2017, 5:19 PM

## 2017-07-14 NOTE — Progress Notes (Signed)
[place] at [place] NAME: [name]    MR#:  [id number]  DATE OF BIRTH:  09/01/1950  SUBJECTIVE:   Plan was to do a cardiac catheterization yday   prior to her having her TAVR but it was canceled as patient went into SVT again this morning. Patient received some IV doses of metoprolol and given a amiodarone bolus and has converted back to a normal sinus rhythm. No other complaints other than some mild constipation.  REVIEW OF SYSTEMS:    Review of Systems  Constitutional: Negative for chills and fever.  HENT: Negative for congestion and tinnitus.   Eyes: Negative for blurred vision and double vision.  Respiratory: Negative for cough, shortness of breath and wheezing.   Cardiovascular: Negative for chest pain, orthopnea and PND.  Gastrointestinal: Positive for constipation. Negative for abdominal pain, diarrhea, nausea and vomiting.  Genitourinary: Negative for dysuria and hematuria.  Neurological: Negative for dizziness, sensory change and focal weakness.  All other systems reviewed and are negative.   Nutrition: Heart Healthy Tolerating Diet: Yes Tolerating PT: Await Eval.   DRUG ALLERGIES:   Allergies  Allergen Reactions  . Tetanus Toxoids Swelling  . Lisinopril Rash  . Niacin Rash  . Sitagliptin Rash  . Sulfamethoxazole-Trimethoprim Rash    VITALS:  Blood pressure 116/86, pulse (!) 58, temperature (!) 97.5 F (36.4 C), temperature source Oral, resp. rate 18, height 5\' 5"  (1.651 m), weight (!) 152 kg (335 lb 3.2 oz), SpO2 98 %.  PHYSICAL EXAMINATION:   Physical Exam  GENERAL:  67 y.o.-year-old obese patient lying in bed in no acute distress. Morbidly OBESE EYES: Pupils equal, round, reactive to light and accommodation. No scleral icterus. Extraocular muscles intact.  HEENT: Head atraumatic, normocephalic. Oropharynx and nasopharynx clear.  NECK:  Supple, no jugular venous distention. No thyroid enlargement, no  tenderness.  LUNGS: Poor Resp. effort, no wheezing, rales, rhonchi. No use of accessory muscles of respiration.  CARDIOVASCULAR: S1, S2 normal. No murmurs, rubs, or gallops.  ABDOMEN: Soft, nontender, nondistended. Bowel sounds present. No organomegaly or mass.  EXTREMITIES: No cyanosis, clubbing, +1-2 edema b/l.    NEUROLOGIC: Cranial nerves II through XII are intact. No focal Motor or sensory deficits b/l.   PSYCHIATRIC: The patient is alert and oriented x 3.  SKIN: No obvious rash, lesion, or ulcer.    LABORATORY PANEL:   CBC Recent Labs  Lab 07/14/17 0518  WBC 8.3  HGB 13.6  HCT 43.1  PLT 160   ------------------------------------------------------------------------------------------------------------------  Chemistries  Recent Labs  Lab 07/14/17 0518  NA 136  K 5.0  CL 107  CO2 21*  GLUCOSE 151*  BUN 49*  CREATININE 1.47*  CALCIUM 8.6*   ------------------------------------------------------------------------------------------------------------------  Cardiac Enzymes Recent Labs  Lab 07/12/17 0715  TROPONINI 3.44*   ------------------------------------------------------------------------------------------------------------------  RADIOLOGY:  No results found.   ASSESSMENT AND PLAN:   67 year old female with past medical history of severe aortic stenosis, obstructive sleep apnea, morbid obesity, hypertension, chronic lymphedema, hyperlipidemia, diastolic CHF, diabetes, history of coronary artery disease who presents to the hospital due to chest pain or palpitations and noted to be in ventricular tachycardia.  1. Wide complex tachycardia- this is SVT with aberrancy as per cardiology. There was some concern for ventricular tachycardia but cardiology as discussed with EP physician and they think this is SVT with aberrancy. -Patient developed this preoperatively today prior to cardiac catheterization and was given 2 doses of IV metoprolol and amiodarone bolus  and  has not converted to normal sinus rhythm. -Plan is to load the patient with oral amiodarone and watch for any further arrhythmias. -Patient has a history of severe dilated cardiomyopathy ejection fraction less than 25%. -off heparin gtt  2. History of severe aortic stenosis-patient is to have a TAVR and Cardiology discussed w/ Dr. Michiel Cowboy who recommended to due Cardiac catheterization prior to TAVR but could not get it done  due to SVT.  - cont. Supportive care as mentioned above with B-blocker, Amio for SVT and plan for possible Cardiac Catheterization on Monday or do Cath and TAVR at [facility] ( Dr Ubaldo Glassing to call [facility] on Monday) - discussed w/ Cardiology.   3. DM type II w/out complication-continue Lantus, sliding scale insulin.  4. Hyperlipidemia-continue atorvastatin, Fenofibrate  5. Constipation - started on some Colace and also Dulculoax PRN.   PT to see pt  All the records are reviewed and case discussed with Care Management/Social Worker. Management plans discussed with the patient, family and they are in agreement.  CODE STATUS: Full code  DVT Prophylaxis: Lovenox  TOTAL TIME TAKING CARE OF THIS PATIENT: 30 minutes.   POSSIBLE D/C IN 2-3 DAYS, DEPENDING ON CLINICAL CONDITION.   Fritzi Mandes M.D on 07/14/2017 at 9:35 AM  Between 7am to 6pm - Pager - [phone]  After 6pm go to [email or web address] - Technical brewer  [facility]ospital[facility]  Office  [phone]  CC: Primary care physician; Frazier Richards, MD

## 2017-07-14 NOTE — Progress Notes (Signed)
Transitioned patient from enoxaparin 40mg  SQ once daily to twice daily due to BMI >40 and CrCl >70ml/min.   Candelaria Stagers, PharmD Pharmacy Resident

## 2017-07-14 NOTE — Progress Notes (Addendum)
Pt in room resting comfortably, no complains of pain, HR has been controlled high 50's Sinus Loletha Grayer. Was able to wean off O2, SpO2 95% on R/A. Will continue to monitor.

## 2017-07-14 NOTE — Progress Notes (Signed)
[place] [facility]  SUBJECTIVE: Feeling little better but complains of shortness of breath and fatigue.   Vitals:   07/13/17 1015 07/13/17 1926 07/14/17 0301 07/14/17 0827  BP: 96/67 117/74 117/88 116/86  Pulse: (!) 51 (!) 59 (!) 59 (!) 58  Resp: 20 16 18 18   Temp:  (!) 97.3 F (36.3 C) (!) 97.5 F (36.4 C)   TempSrc:  Oral Oral   SpO2: 95% 95% 100% 98%  Weight:   (!) 152 kg (335 lb 3.2 oz)   Height:        Intake/Output Summary (Last 24 hours) at 07/14/2017 1009 Last data filed at 07/14/2017 0447 Gross per 24 hour  Intake -  Output 1250 ml  Net -1250 ml    LABS: Basic Metabolic Panel: Recent Labs    07/13/17 0707 07/14/17 0518  NA 135 136  K 4.6 5.0  CL 107 107  CO2 18* 21*  GLUCOSE 168* 151*  BUN 51* 49*  CREATININE 1.68* 1.47*  CALCIUM 8.4* 8.6*   Liver Function Tests: No results for input(s): AST, ALT, ALKPHOS, BILITOT, PROT, ALBUMIN in the last 72 hours. No results for input(s): LIPASE, AMYLASE in the last 72 hours. CBC: Recent Labs    07/13/17 0707 07/14/17 0518  WBC 6.9 8.3  HGB 13.3 13.6  HCT 42.5 43.1  MCV 85.3 84.7  PLT 157 160   Cardiac Enzymes: Recent Labs    07/11/17 1734 07/12/17 0109 07/12/17 0715  TROPONINI 3.91* 3.86* 3.44*   BNP: Invalid input(s): POCBNP D-Dimer: No results for input(s): DDIMER in the last 72 hours. Hemoglobin A1C: Recent Labs    07/12/17 0109  HGBA1C 6.4*   Fasting Lipid Panel: No results for input(s): CHOL, HDL, LDLCALC, TRIG, CHOLHDL, LDLDIRECT in the last 72 hours. Thyroid Function Tests: No results for input(s): TSH, T4TOTAL, T3FREE, THYROIDAB in the last 72 hours.  Invalid input(s): FREET3 Anemia Panel: No results for input(s): VITAMINB12, FOLATE, FERRITIN, TIBC, IRON, RETICCTPCT in the last 72 hours.   Physical Exam: Blood pressure 116/86, pulse (!) 58, temperature (!) 97.5 F (36.4 C), temperature source Oral, resp. rate 18, height 5\' 5"  (1.651  m), weight (!) 152 kg (335 lb 3.2 oz), SpO2 98 %.   Wt Readings from Last 1 Encounters:  07/14/17 (!) 152 kg (335 lb 3.2 oz)     General appearance: alert and cooperative Resp: clear to auscultation bilaterally Cardio: regular rate and rhythm GI: soft, non-tender; bowel sounds normal; no masses,  no organomegaly Extremities: extremities normal, atraumatic, no cyanosis or edema Neurologic: Grossly normal  TELEMETRY: Reviewed telemetry pt in early in sinus rhythm:  ASSESSMENT AND PLAN:  Active Problems:   Wide-complex tachycardia (HCC)-review of we will me with electrophysiology suggest that this is an SVT with right bundle branch block.  Axis did not change when going from sinus rhythm to the tachycardia.  Far she has improved with IV followed by oral amiodarone load.  We will continue with amiodarone at 400 mg twice daily and metoprolol tartrate at 12.5 mg twice daily.  Continue to follow heart rate and hemodynamics.  Continue with enoxaparin at low dose for prophylactic use at present.  Aortic stenosis-has been seen by structural heart team at [facility].  Will discuss with them either this weekend or Monday regarding options for proceeding with left and right heart cath to define coronary anatomy and saturation for T AVR.  Patient would be extremely high risk candidate  for S AVR.  Continue with furosemide 40 mg daily.  Also continue with atorvastatin at 40 mg daily.  Obesity-low-fat low-sodium diet is recommended.    Teodoro Spray, MD, [facility] 07/14/2017 10:09 AM

## 2017-07-14 NOTE — Plan of Care (Signed)
  Progressing Education: Knowledge of General Education information will improve 07/14/2017 1333 - Progressing by Rolley Sims, RN Clinical Measurements: Will remain free from infection 07/14/2017 1333 - Progressing by Rolley Sims, RN Activity: Risk for activity intolerance will decrease 07/14/2017 1333 - Progressing by Rolley Sims, RN Nutrition: Adequate nutrition will be maintained 07/14/2017 1333 - Progressing by Rolley Sims, RN

## 2017-07-15 LAB — GLUCOSE, CAPILLARY
GLUCOSE-CAPILLARY: 217 mg/dL — AB (ref 65–99)
GLUCOSE-CAPILLARY: 128 mg/dL — AB (ref 65–99)
Glucose-Capillary: 97 mg/dL (ref 65–99)
Glucose-Capillary: 174 mg/dL — ABNORMAL HIGH (ref 65–99)

## 2017-07-15 NOTE — Progress Notes (Signed)
[place] at [place] NAME: [name]    MR#:  [id number]  DATE OF BIRTH:  12-19-1950  SUBJECTIVE:   Denies any complaints.  Patient being in between conversation Remains in sinus rhythm heart rate in the 60s.  No chest pain REVIEW OF SYSTEMS:    Review of Systems  Constitutional: Negative for chills and fever.  HENT: Negative for congestion and tinnitus.   Eyes: Negative for blurred vision and double vision.  Respiratory: Negative for cough, shortness of breath and wheezing.   Cardiovascular: Negative for chest pain, orthopnea and PND.  Gastrointestinal: Positive for constipation. Negative for abdominal pain, diarrhea, nausea and vomiting.  Genitourinary: Negative for dysuria and hematuria.  Neurological: Negative for dizziness, sensory change and focal weakness.  All other systems reviewed and are negative.   Nutrition: Heart Healthy Tolerating Diet: Yes Tolerating PT: Await Eval.   DRUG ALLERGIES:   Allergies  Allergen Reactions  . Tetanus Toxoids Swelling  . Lisinopril Rash  . Niacin Rash  . Sitagliptin Rash  . Sulfamethoxazole-Trimethoprim Rash    VITALS:  Blood pressure 123/82, pulse (!) 59, temperature 97.8 F (36.6 C), temperature source Oral, resp. rate 14, height 5\' 5"  (1.651 m), weight (!) 152 kg (335 lb 3.2 oz), SpO2 93 %.  PHYSICAL EXAMINATION:   Physical Exam  GENERAL:  67 y.o.-year-old obese patient lying in bed in no acute distress. Morbidly OBESE EYES: Pupils equal, round, reactive to light and accommodation. No scleral icterus. Extraocular muscles intact.  HEENT: Head atraumatic, normocephalic. Oropharynx and nasopharynx clear.  NECK:  Supple, no jugular venous distention. No thyroid enlargement, no tenderness.  LUNGS: Poor Resp. effort, no wheezing, rales, rhonchi. No use of accessory muscles of respiration.  CARDIOVASCULAR: S1, S2 normal. No murmurs, rubs, or gallops.  ABDOMEN: Soft, nontender,  nondistended. Bowel sounds present. No organomegaly or mass.  EXTREMITIES: No cyanosis, clubbing, +1-2 edema b/l.    NEUROLOGIC: Cranial nerves II through XII are intact. No focal Motor or sensory deficits b/l.   PSYCHIATRIC: The patient is alert and oriented x 3.  SKIN: No obvious rash, lesion, or ulcer.    LABORATORY PANEL:   CBC Recent Labs  Lab 07/14/17 0518  WBC 8.3  HGB 13.6  HCT 43.1  PLT 160   ------------------------------------------------------------------------------------------------------------------  Chemistries  Recent Labs  Lab 07/14/17 0518  NA 136  K 5.0  CL 107  CO2 21*  GLUCOSE 151*  BUN 49*  CREATININE 1.47*  CALCIUM 8.6*   ------------------------------------------------------------------------------------------------------------------  Cardiac Enzymes Recent Labs  Lab 07/12/17 0715  TROPONINI 3.44*   ------------------------------------------------------------------------------------------------------------------  RADIOLOGY:  No results found.   ASSESSMENT AND PLAN:   67 year old female with past medical history of severe aortic stenosis, obstructive sleep apnea, morbid obesity, hypertension, chronic lymphedema, hyperlipidemia, diastolic CHF, diabetes, history of coronary artery disease who presents to the hospital due to chest pain or palpitations and noted to be in ventricular tachycardia.  1. Wide complex tachycardia- this is SVT with aberrancy as per cardiology. There was some concern for ventricular tachycardia but cardiology as discussed with EP physician and they think this is SVT with aberrancy. -Patient developed this preoperatively today prior to cardiac catheterization and was given 2 doses of IV metoprolol and amiodarone bolus and has not converted to normal sinus rhythm. -Plan is to load the patient with oral amiodarone and watch for any further arrhythmias. -Patient has a history of severe dilated cardiomyopathy ejection  fraction less than 25%. -off heparin  gtt  2. History of severe aortic stenosis-patient is to have a TAVR and Cardiology discussed w/ Dr. Michiel Cowboy who recommended to due Cardiac catheterization prior to TAVR but could not get it done  due to SVT.  - cont. Supportive care as mentioned above with B-blocker, Amio for SVT and plan for possible Cardiac Catheterization on Monday or do Cath and TAVR at [facility] ( Dr Ubaldo Glassing [facility]) - discussed w/ Cardiology.   3. DM type II w/out complication-continue Lantus, sliding scale insulin.  4. Hyperlipidemia-continue atorvastatin, Fenofibrate  5. Constipation - started on some Colace and also Dulculoax PRN.   PT to see pt--- recommends rehab  All the records are reviewed and case discussed with Care Management/Social Worker. Management plans discussed with the patient, family and they are in agreement.  CODE STATUS: Full code  DVT Prophylaxis: Lovenox  TOTAL TIME TAKING CARE OF THIS PATIENT: 30 minutes.   POSSIBLE D/C IN 2-3 DAYS, DEPENDING ON CLINICAL CONDITION.   Fritzi Mandes M.D on 07/15/2017 at 1:45 PM  Between 7am to 6pm - Pager - [phone]  After 6pm go to [email or web address] - Technical brewer [facility]  Office  [phone]  CC: Primary care physician; Frazier Richards, MD

## 2017-07-15 NOTE — Progress Notes (Signed)
Rate in better control. Currently stable. Will discuss with tavr team in am.

## 2017-07-15 NOTE — Progress Notes (Signed)
Pt's HR has ran in mid 50's throughout the night. Lopressor was held related to this. HR increased to 61 when I woke pt. To take her vital signs. Will continue to monitor pt.

## 2017-07-15 NOTE — NC FL2 (Signed)
[place] LEVEL OF CARE SCREENING TOOL     IDENTIFICATION  Patient Name: [name] Birthdate: Jun 21, 1951 Sex: female Admission Date (Current Location): 07/11/2017  [place] and [place] Number:  Engineering geologist and Address:  [facility], [address], [place], [place] [address]      Provider Number: [id number]  Attending Physician Name and Address:  Fritzi Mandes, MD  Relative Name and Phone Number:  Cleda Clarks [phone]    Current Level of Care: Hospital Recommended Level of Care: [place] Prior Approval Number:    Date Approved/Denied:   PASRR Number: [id number]  Discharge Plan: SNF    Current Diagnoses: Patient Active Problem List   Diagnosis Date Noted  . Wide-complex tachycardia ([place]) 07/11/2017  . Aortic stenosis 05/21/2017  . Syncope, near 05/19/2017  . Chronic diastolic heart failure ([place]) 07/27/2016  . HTN (hypertension) 07/27/2016  . Obstructive sleep apnea 07/27/2016  . Cellulitis 07/27/2016  . Pressure injury of skin 05/02/2016  . Lactic acidosis   . Severe aortic stenosis   . OSA on CPAP   . Morbid obesity ([place])   . Community acquired pneumonia     Orientation RESPIRATION BLADDER Height & Weight     Self, Time, Situation, Place  Normal Continent Weight: (!) 335 lb 3.2 oz (152 kg) Height:  5\' 5"  (165.1 cm)  BEHAVIORAL SYMPTOMS/MOOD NEUROLOGICAL BOWEL NUTRITION STATUS      Continent Diet(Heart healthy/Carb modified)  AMBULATORY STATUS COMMUNICATION OF NEEDS Skin   Extensive Assist Verbally Normal                       Personal Care Assistance Level of Assistance  Bathing, Feeding, Dressing Bathing Assistance: Limited assistance   Dressing Assistance: Limited assistance     Functional Limitations Info             SPECIAL CARE FACTORS FREQUENCY  PT (By licensed PT)     PT Frequency: Up to 5/week              Contractures Contractures Info: Not present     Additional Factors Info  Code Status, Allergies, Insulin Sliding Scale Code Status Info: Full Allergies Info: Tetanus Toxoids, Lisinopril, Niacin, Sitagliptin, Sulfamethoxazole-trimethoprim   Insulin Sliding Scale Info: Novolog: 0-15 units TID with meals and 0-5 units QHS       Current Medications (07/15/2017):  This is the current hospital active medication list Current Facility-Administered Medications  Medication Dose Route Frequency Provider Last Rate Last Dose  . acetaminophen (TYLENOL) tablet 650 mg  650 mg Oral Q6H PRN Hillary Bow, MD   650 mg at 07/15/17 0340   Or  . acetaminophen (TYLENOL) suppository 650 mg  650 mg Rectal Q6H PRN Sudini, Alveta Heimlich, MD      . amiodarone (PACERONE) tablet 400 mg  400 mg Oral BID Henreitta Leber, MD   400 mg at 07/15/17 1007  . aspirin EC tablet 81 mg  81 mg Oral Daily Hillary Bow, MD   81 mg at 07/15/17 1009  . atorvastatin (LIPITOR) tablet 40 mg  40 mg Oral q1800 Hillary Bow, MD   40 mg at 07/14/17 1719  . bisacodyl (DULCOLAX) suppository 10 mg  10 mg Rectal Daily PRN Henreitta Leber, MD      . cephALEXin (KEFLEX) capsule 500 mg  500 mg Oral Daily Hillary Bow, MD   500 mg at 07/15/17 1009  . docusate sodium (COLACE) capsule 100 mg  100 mg Oral BID Henreitta Leber, MD   100 mg at 07/15/17 1008  . enoxaparin (LOVENOX) injection 40 mg  40 mg Subcutaneous Q12H Candelaria Stagers, RPH   40 mg at 07/15/17 1007  . fenofibrate tablet 160 mg  160 mg Oral Daily Sudini, Alveta Heimlich, MD   160 mg at 07/15/17 1007  . furosemide (LASIX) tablet 40 mg  40 mg Oral Daily Fritzi Mandes, MD   40 mg at 07/15/17 1008  . insulin aspart (novoLOG) injection 0-15 Units  0-15 Units Subcutaneous TID WC Hillary Bow, MD   5 Units at 07/15/17 1300  . insulin aspart (novoLOG) injection 0-5 Units  0-5 Units Subcutaneous QHS Hillary Bow, MD   2 Units at 07/12/17 2202  . insulin glargine (LANTUS) injection 30 Units  30 Units Subcutaneous Daily Hillary Bow, MD   30  Units at 07/15/17 1007  . levalbuterol (XOPENEX) nebulizer solution 0.63 mg  0.63 mg Nebulization Q6H PRN Wilhelmina Mcardle, MD      . metoprolol tartrate (LOPRESSOR) tablet 12.5 mg  12.5 mg Oral Q6H Paraschos, Alexander, MD   12.5 mg at 07/15/17 1200  . mupirocin ointment (BACTROBAN) 2 % 1 application  1 application Nasal BID Awilda Bill, NP   1 application at 02/63/78 1007  . ondansetron (ZOFRAN) injection 4 mg  4 mg Intravenous Q6H PRN Hillary Bow, MD   4 mg at 07/15/17 0340  . polyethylene glycol (MIRALAX / GLYCOLAX) packet 17 g  17 g Oral Daily PRN Sudini, Alveta Heimlich, MD      . sodium chloride flush (NS) 0.9 % injection 3 mL  3 mL Intravenous Q12H Hillary Bow, MD   3 mL at 07/14/17 2126     Discharge Medications: Please see discharge summary for a list of discharge medications.  Relevant Imaging Results:  Relevant Lab Results:   Additional Information SS# [id number]  Zettie Pho, LCSW

## 2017-07-15 NOTE — Progress Notes (Signed)
Pt. Catnapped throughout the night with c/o nausea and headache x2, medicated with effective results each time. No SOB or acute distress noted. Will continue to monitor pt.

## 2017-07-16 LAB — GLUCOSE, CAPILLARY
GLUCOSE-CAPILLARY: 71 mg/dL (ref 65–99)
Glucose-Capillary: 73 mg/dL (ref 65–99)

## 2017-07-16 MED ORDER — DOCUSATE SODIUM 100 MG PO CAPS: 100.0000 mg | ORAL_CAPSULE | Freq: Two times a day (BID) | ORAL | 0 refills | Status: DC

## 2017-07-16 MED ORDER — AMIODARONE HCL 400 MG PO TABS: 400.0000 mg | ORAL_TABLET | Freq: Two times a day (BID) | ORAL | 1 refills | Status: DC

## 2017-07-16 MED ORDER — POLYETHYLENE GLYCOL 3350 17 G PO PACK: 17.0000 g | PACK | Freq: Every day | ORAL | 0 refills | Status: DC | PRN

## 2017-07-16 NOTE — Clinical Social Work Placement (Signed)
   CLINICAL SOCIAL WORK PLACEMENT  NOTE  Date:  07/16/2017  Patient Details  Name: [name] MRN: [id number] Date of Birth: [birth year]  Clinical Social Work is seeking post-discharge placement for this patient at the [place] level of care (*CSW will initial, date and re-position this form in  chart as items are completed):  Yes   Patient/family provided with [place] Work Department's list of facilities offering this level of care within the geographic area requested by the patient (or if unable, by the patient's family).  Yes   Patient/family informed of their freedom to choose among providers that offer the needed level of care, that participate in Medicare, Medicaid or managed care program needed by the patient, have an available bed and are willing to accept the patient.  Yes   Patient/family informed of [facility]'s ownership interest in [facility] and [facility], as well as of the fact that they are under no obligation to receive care at these facilities.  PASRR submitted to EDS on       PASRR number received on       Existing PASRR number confirmed on 07/15/17     FL2 transmitted to all facilities in geographic area requested by pt/family on 07/15/17     FL2 transmitted to all facilities within larger geographic area on       Patient informed that his/her managed care company has contracts with or will negotiate with certain facilities, including the following:        Yes   Patient/family informed of bed offers received.  Patient chooses bed at [facility])     Physician recommends and patient chooses bed at [facility])    Patient to be transferred to [facility]) on 07/16/17.  Patient to be transferred to facility by (family friend)     Patient family notified on 07/16/17 of transfer.  Name of family member notified:  (patient is alert and oriented X4 and notified her family member)     PHYSICIAN       Additional Comment:    _______________________________________________ Shela Leff, LCSW 07/16/2017, 12:17 PM

## 2017-07-16 NOTE — Discharge Instructions (Signed)

## 2017-07-16 NOTE — Clinical Social Work Note (Signed)
Clinical Social Work Assessment  Patient Details  Name: [name] MRN: [id number] Date of Birth: [birth year]  Date of referral:  07/16/17               Reason for consult:  Discharge Planning                Permission sought to share information with:  Facility Art therapist granted to share information::  Yes, Verbal Permission Granted  Name::        Agency::     Relationship::     Contact Information:     Housing/Transportation Living arrangements for the past 2 months:  Apartment Source of Information:  Patient Patient Interpreter Needed:  None Criminal Activity/Legal Involvement Pertinent to Current Situation/Hospitalization:  No - Comment as needed Significant Relationships:  Other Family Members Lives with:  Self Do you feel safe going back to the place where you live?  Yes Need for family participation in patient care:  Yes (Comment)  Care giving concerns:  Patient resides alone in an apartment.   Social Worker assessment / plan:  Patient is to discharge today and PT is recommending STR. CSW spoke with patient and extended bed offers from yesterday's initial bed search. Patient has chosen [facility] and [facility] can accept patient today. Discharge information sent to [facility]. Patient does not need EMS transportation and is having her family member transport her. Patient's nurse is aware that when he is ready to discharge patient, he will have patient call her family member to come and get her.  Employment status:  Disabled (Comment on whether or not currently receiving Disability) Insurance information:  Medicare PT Recommendations:    Information / Referral to community resources:  [place]  Patient/Family's Response to care:  Patient expressed appreciation for CSW assistance.  Patient/Family's Understanding of and Emotional Response to Diagnosis, Current Treatment, and Prognosis:   Patient is ambulating with one assist to the bathroom and believes she will improve more with going to STR.  Emotional Assessment Appearance:  Appears stated age Attitude/Demeanor/Rapport:  (pleasant) Affect (typically observed):  Accepting, Adaptable, Calm Orientation:  Oriented to Self, Oriented to Place, Oriented to  Time, Oriented to Situation Alcohol / Substance use:  Not Applicable Psych involvement (Current and /or in the community):  No (Comment)  Discharge Needs  Concerns to be addressed:  Care Coordination Readmission within the last 30 days:  No Current discharge risk:  None Barriers to Discharge:  No Barriers Identified   Shela Leff, LCSW 07/16/2017, 11:42 AM

## 2017-07-16 NOTE — Discharge Summary (Signed)
[facility] NAME: [name]    MR#:  [id number]  DATE OF BIRTH:  12/14/1950  DATE OF ADMISSION:  07/11/2017 ADMITTING PHYSICIAN: Hillary Bow, MD  DATE OF DISCHARGE: 07/16/2017  PRIMARY CARE PHYSICIAN: Frazier Richards, MD    ADMISSION DIAGNOSIS:  Sinus bradycardia [R00.1] Wide-complex tachycardia (HCC) [I47.2] NSTEMI (non-ST elevated myocardial infarction) (HCC) [I21.4] Acute on chronic diastolic congestive heart failure (HCC) [I50.33] Hypotension, unspecified hypotension type [I95.9]  DISCHARGE DIAGNOSIS:  Wide-complex tachycardia now in sinus rhythm History of severe aortic stenosis--- workup to be done as outpatient with [facility] cardiology ( Dr Marisa Severin)  SECONDARY DIAGNOSIS:   Past Medical History:  Diagnosis Date  . Bacteremia   . CAD (coronary artery disease)   . Diabetes mellitus without complication ([place])   . Diastolic CHF ([place])   . Hyperlipemia   . Hypertension   . Lymphedema   . MI (myocardial infarction) ([place])   . OSA (obstructive sleep apnea)   . Severe aortic stenosis     HOSPITAL COURSE:   67 year old female with past medical history of severe aortic stenosis, obstructive sleep apnea, morbid obesity, hypertension, chronic lymphedema, hyperlipidemia, diastolic CHF, diabetes, history of coronary artery disease who presents to the hospital due to chest pain or palpitations and noted to be in ventricular tachycardia.  1. Wide complex tachycardia- this is SVT with aberrancy as per cardiology. There was some concern for ventricular tachycardia but cardiology as discussed with EP physician and they think this is SVT with aberrancy. -Patient developed this preoperatively today prior to cardiac catheterization and was given 2 doses of IV metoprolol and amiodarone bolus and has not converted to normal sinus rhythm. -Plan is to continue patient with oral amiodarone and follow-up outpatient with [facility]  cardiology Dr. Marisa Severin. -Patient has a history of severe dilated cardiomyopathy ejection fraction less than 25%.  2. History of severe aortic stenosis-patient is to have a TAVR and Cardiology discussed w/ Dr. Michiel Cowboy who recommended to do Cardiac catheterization prior to TAVR but could not get it done  due to SVT---this will be done as outpatient with [facility] cardiology - cont. Supportive care as mentioned above with B-blocker, Amio for SVT and plan for possible Cardiac Catheterization and [facility]AVR at [facility] will be set up as outpatient - discussed w/ Cardiology Dr Ubaldo Glassing  3. DM type II w/out complication-continue Lantus, metformin and sliding scale insulin.  4. Hyperlipidemia-continue atorvastatin, Fenofibrate  5. Constipation - started on some Colace and also Dulculoax PRN.   PT to see pt--- recommends rehab Patient will discharge to [place] today with outpatient cardiology workup at [facility].   CONSULTS OBTAINED:  Treatment Team:  Isaias Cowman, MD Teodoro Spray, MD  DRUG ALLERGIES:   Allergies  Allergen Reactions  . Tetanus Toxoids Swelling  . Lisinopril Rash  . Niacin Rash  . Sitagliptin Rash  . Sulfamethoxazole-Trimethoprim Rash    DISCHARGE MEDICATIONS:   Allergies as of 07/16/2017      Reactions   Tetanus Toxoids Swelling   Lisinopril Rash   Niacin Rash   Sitagliptin Rash   Sulfamethoxazole-trimethoprim Rash      Medication List    STOP taking these medications   cephALEXin 500 MG capsule Commonly known as:  KEFLEX     TAKE these medications   amiodarone 400 MG tablet Commonly known as:  PACERONE Take 1 tablet (400 mg total) by mouth 2 (two) times daily.   aspirin EC 81  MG tablet Take 81 mg by mouth daily.   atorvastatin 40 MG tablet Commonly known as:  LIPITOR Take 1 tablet (40 mg total) by mouth daily at 6 PM.   docusate sodium 100 MG capsule Commonly known as:  COLACE Take 1 capsule (100 mg total) by mouth 2 (two) times daily.    fenofibrate 145 MG tablet Commonly known as:  TRICOR Take 1 tablet by mouth daily.   furosemide 40 MG tablet Commonly known as:  LASIX Take 1 tablet (40 mg total) by mouth daily.   LANTUS SOLOSTAR 100 UNIT/ML Solostar Pen Generic drug:  Insulin Glargine Inject 50 Units into the skin daily. Titrate for morning sugars less than 120   metFORMIN 1000 MG tablet Commonly known as:  GLUCOPHAGE Take 1 tablet (1,000 mg total) by mouth daily with breakfast.   metoprolol tartrate 25 MG tablet Commonly known as:  LOPRESSOR Take 1 tablet (25 mg total) by mouth 2 (two) times daily.   nystatin powder Commonly known as:  MYCOSTATIN/NYSTOP Apply topically 4 (four) times daily.   polyethylene glycol packet Commonly known as:  MIRALAX / GLYCOLAX Take 17 g by mouth daily as needed for mild constipation.   potassium chloride 10 MEQ tablet Commonly known as:  K-DUR Take 1 tablet by mouth daily.   triamcinolone cream 0.1 % Commonly known as:  KENALOG Apply 1 application topically daily.       If you experience worsening of your admission symptoms, develop shortness of breath, life threatening emergency, suicidal or homicidal thoughts you must seek medical attention immediately by calling 911 or calling your MD immediately  if symptoms less severe.  You Must read complete instructions/literature along with all the possible adverse reactions/side effects for all the Medicines you take and that have been prescribed to you. Take any new Medicines after you have completely understood and accept all the possible adverse reactions/side effects.   Please note  You were cared for by a hospitalist during your hospital stay. If you have any questions about your discharge medications or the care you received while you were in the hospital after you are discharged, you can call the unit and asked to speak with the hospitalist on call if the hospitalist that took care of you is not available. Once you are  discharged, your primary care physician will handle any further medical issues. Please note that NO REFILLS for any discharge medications will be authorized once you are discharged, as it is imperative that you return to your primary care physician (or establish a relationship with a primary care physician if you do not have one) for your aftercare needs so that they can reassess your need for medications and monitor your lab values. Today   SUBJECTIVE    No new complaint VITAL SIGNS:  Blood pressure 118/80, pulse (!) 56, temperature 97.7 F (36.5 C), temperature source Oral, resp. rate 18, height 5\' 5"  (1.651 m), weight (!) 152 kg (335 lb 1.6 oz), SpO2 95 %.  I/O:    Intake/Output Summary (Last 24 hours) at 07/16/2017 1050 Last data filed at 07/16/2017 0414 Gross per 24 hour  Intake 5 ml  Output 900 ml  Net -895 ml    PHYSICAL EXAMINATION:  GENERAL:  67 y.o.-year-old patient lying in the bed with no acute distress.  Morbidly obese EYES: Pupils equal, round, reactive to light and accommodation. No scleral icterus. Extraocular muscles intact.  HEENT: Head atraumatic, normocephalic. Oropharynx and nasopharynx clear.  NECK:  Supple, no jugular  venous distention. No thyroid enlargement, no tenderness.  LUNGS: Normal breath sounds bilaterally, no wheezing, rales,rhonchi or crepitation. No use of accessory muscles of respiration.  CARDIOVASCULAR: S1, S2 normal. No murmurs, rubs, or gallops.  ABDOMEN: Soft, non-tender, non-distended. Bowel sounds present. No organomegaly or mass.  EXTREMITIES: No pedal edema, cyanosis, or clubbing.  NEUROLOGIC: Cranial nerves II through XII are intact. Muscle strength 5/5 in all extremities. Sensation intact. Gait not checked.  PSYCHIATRIC: The patient is alert and oriented x 3.  SKIN: No obvious rash, lesion, or ulcer.   DATA REVIEW:   CBC  Recent Labs  Lab 07/14/17 0518  WBC 8.3  HGB 13.6  HCT 43.1  PLT 160    Chemistries  Recent Labs  Lab  07/14/17 0518  NA 136  K 5.0  CL 107  CO2 21*  GLUCOSE 151*  BUN 49*  CREATININE 1.47*  CALCIUM 8.6*    Microbiology Results   Recent Results (from the past 240 hour(s))  MRSA PCR Screening     Status: Abnormal   Collection Time: 07/12/17 12:00 AM  Result Value Ref Range Status   MRSA by PCR POSITIVE (A) NEGATIVE Final    Comment:        The GeneXpert MRSA Assay (FDA approved for NASAL specimens only), is one component of a comprehensive MRSA colonization surveillance program. It is not intended to diagnose MRSA infection nor to guide or monitor treatment for MRSA infections. RESULT CALLED TO, READ BACK BY AND VERIFIED WITH: Trilby Leaver AT 0149 07/12/17 ALV Performed at [facility], [address], [place], [place] [address]     RADIOLOGY:  No results found.   Management plans discussed with the patient, family and they are in agreement.  CODE STATUS:     Code Status Orders  (From admission, onward)        Start     Ordered   07/11/17 2038  Full code  Continuous     07/11/17 2043    Code Status History    Date Active Date Inactive Code Status Order ID Comments User Context   05/19/2017 01:41 05/22/2017 19:53 Full Code [id number]  Saundra Shelling, MD Inpatient   07/14/2016 11:19 07/19/2016 00:28 Full Code [id number]  Vaughan Basta, MD Inpatient   07/14/2016 01:05 07/14/2016 11:19 DNR [id number]  Dustin Flock, MD Inpatient   07/13/2016 19:17 07/14/2016 01:05 Full Code [id number]  Dustin Flock, MD ED   05/02/2016 12:21 05/09/2016 21:23 DNR [id number]  Vilinda Boehringer, MD ED      TOTAL TIME TAKING CARE OF THIS PATIENT: 40 minutes.    Fritzi Mandes M.D on 07/16/2017 at 10:50 AM  Between 7am to 6pm - Pager - [phone] After 6pm go to [email or web address] - password [email or web address] [place] [facility]  Office  [phone]  CC: Primary care physician; Frazier Richards, MD

## 2017-07-16 NOTE — Care Management Important Message (Signed)
Important Message  Patient Details  Name: [name] MRN: [id number] Date of Birth: 01-09-1951   Medicare Important Message Given:  Yes    Shelbie Ammons, RN 07/16/2017, 8:55 AM

## 2017-07-16 NOTE — Progress Notes (Signed)
Called report to the facility nurse at this time. All questions answered. Wenda Low [facility]

## 2017-07-16 NOTE — Progress Notes (Signed)
Never received nurse-to-nurse report on this patient this AM. Daron Offer

## 2017-09-17 DIAGNOSIS — I2782 Chronic pulmonary embolism: Secondary | ICD-10-CM | POA: Insufficient documentation

## 2017-10-03 DIAGNOSIS — I442 Atrioventricular block, complete: Secondary | ICD-10-CM | POA: Insufficient documentation

## 2017-11-02 ENCOUNTER — Other Ambulatory Visit: Payer: Self-pay

## 2017-11-02 ENCOUNTER — Emergency Department: Payer: Medicare Other

## 2017-11-02 ENCOUNTER — Inpatient Hospital Stay
Admission: EM | Admit: 2017-11-02 | Discharge: 2017-11-05 | DRG: 871 | Disposition: A | Discharge status: Home Health | Payer: Medicare Other | Attending: Internal Medicine | Admitting: Internal Medicine

## 2017-11-02 DIAGNOSIS — A419 Sepsis, unspecified organism: Secondary | ICD-10-CM | POA: Diagnosis present

## 2017-11-02 DIAGNOSIS — G9340 Encephalopathy, unspecified: Secondary | ICD-10-CM | POA: Diagnosis not present

## 2017-11-02 DIAGNOSIS — N179 Acute kidney failure, unspecified: Secondary | ICD-10-CM | POA: Diagnosis present

## 2017-11-02 DIAGNOSIS — G4733 Obstructive sleep apnea (adult) (pediatric): Secondary | ICD-10-CM | POA: Diagnosis present

## 2017-11-02 DIAGNOSIS — Z6841 Body Mass Index (BMI) 40.0 and over, adult: Secondary | ICD-10-CM

## 2017-11-02 DIAGNOSIS — R6521 Severe sepsis with septic shock: Secondary | ICD-10-CM | POA: Diagnosis present

## 2017-11-02 DIAGNOSIS — Z952 Presence of prosthetic heart valve: Secondary | ICD-10-CM | POA: Diagnosis not present

## 2017-11-02 DIAGNOSIS — Z794 Long term (current) use of insulin: Secondary | ICD-10-CM

## 2017-11-02 DIAGNOSIS — Z79899 Other long term (current) drug therapy: Secondary | ICD-10-CM | POA: Diagnosis not present

## 2017-11-02 DIAGNOSIS — E785 Hyperlipidemia, unspecified: Secondary | ICD-10-CM | POA: Diagnosis present

## 2017-11-02 DIAGNOSIS — Z7982 Long term (current) use of aspirin: Secondary | ICD-10-CM | POA: Diagnosis not present

## 2017-11-02 DIAGNOSIS — I252 Old myocardial infarction: Secondary | ICD-10-CM

## 2017-11-02 DIAGNOSIS — J156 Pneumonia due to other aerobic Gram-negative bacteria: Secondary | ICD-10-CM | POA: Diagnosis present

## 2017-11-02 DIAGNOSIS — I890 Lymphedema, not elsewhere classified: Secondary | ICD-10-CM | POA: Diagnosis present

## 2017-11-02 DIAGNOSIS — I5032 Chronic diastolic (congestive) heart failure: Secondary | ICD-10-CM | POA: Diagnosis present

## 2017-11-02 DIAGNOSIS — A4159 Other Gram-negative sepsis: Secondary | ICD-10-CM | POA: Diagnosis present

## 2017-11-02 DIAGNOSIS — R509 Fever, unspecified: Secondary | ICD-10-CM

## 2017-11-02 DIAGNOSIS — E119 Type 2 diabetes mellitus without complications: Secondary | ICD-10-CM | POA: Diagnosis present

## 2017-11-02 DIAGNOSIS — G9341 Metabolic encephalopathy: Secondary | ICD-10-CM | POA: Diagnosis present

## 2017-11-02 DIAGNOSIS — I2510 Atherosclerotic heart disease of native coronary artery without angina pectoris: Secondary | ICD-10-CM | POA: Diagnosis present

## 2017-11-02 DIAGNOSIS — Z66 Do not resuscitate: Secondary | ICD-10-CM | POA: Diagnosis present

## 2017-11-02 DIAGNOSIS — M79606 Pain in leg, unspecified: Secondary | ICD-10-CM

## 2017-11-02 DIAGNOSIS — I110 Hypertensive heart disease with heart failure: Secondary | ICD-10-CM | POA: Diagnosis present

## 2017-11-02 DIAGNOSIS — J189 Pneumonia, unspecified organism: Secondary | ICD-10-CM

## 2017-11-02 DIAGNOSIS — A4151 Sepsis due to Escherichia coli [E. coli]: Secondary | ICD-10-CM | POA: Diagnosis present

## 2017-11-02 DIAGNOSIS — Z950 Presence of cardiac pacemaker: Secondary | ICD-10-CM

## 2017-11-02 LAB — GLUCOSE, CAPILLARY
GLUCOSE-CAPILLARY: 116 mg/dL — AB (ref 65–99)
Glucose-Capillary: 110 mg/dL — ABNORMAL HIGH (ref 65–99)

## 2017-11-02 LAB — URINALYSIS, COMPLETE (UACMP) WITH MICROSCOPIC
BACTERIA UA: NONE SEEN
Bilirubin Urine: NEGATIVE
Glucose, UA: NEGATIVE mg/dL
KETONES UR: NEGATIVE mg/dL
LEUKOCYTES UA: NEGATIVE
Nitrite: NEGATIVE
Protein, ur: NEGATIVE mg/dL
SQUAMOUS EPITHELIAL / LPF: NONE SEEN (ref 0–5)
Specific Gravity, Urine: 1.008 (ref 1.005–1.030)
pH: 5 (ref 5.0–8.0)

## 2017-11-02 LAB — COMPREHENSIVE METABOLIC PANEL
ALBUMIN: 3.9 g/dL (ref 3.5–5.0)
ALT: 47 U/L (ref 14–54)
AST: 62 U/L — ABNORMAL HIGH (ref 15–41)
Alkaline Phosphatase: 79 U/L (ref 38–126)
Anion gap: 14 (ref 5–15)
BUN: 51 mg/dL — ABNORMAL HIGH (ref 6–20)
CHLORIDE: 101 mmol/L (ref 101–111)
CO2: 24 mmol/L (ref 22–32)
Calcium: 9.2 mg/dL (ref 8.9–10.3)
Creatinine, Ser: 1.65 mg/dL — ABNORMAL HIGH (ref 0.44–1.00)
GFR calc non Af Amer: 31 mL/min — ABNORMAL LOW (ref >60)
GFR, EST AFRICAN AMERICAN: 36 mL/min — AB (ref >60)
GLUCOSE: 149 mg/dL — AB (ref 65–99)
POTASSIUM: 4.5 mmol/L (ref 3.5–5.1)
SODIUM: 139 mmol/L (ref 135–145)
Total Bilirubin: 1 mg/dL (ref 0.3–1.2)
Total Protein: 7.4 g/dL (ref 6.5–8.1)

## 2017-11-02 LAB — MRSA PCR SCREENING: MRSA by PCR: NEGATIVE

## 2017-11-02 LAB — CBC WITH DIFFERENTIAL/PLATELET
BASOS PCT: 0 %
Basophils Absolute: 0 10*3/uL (ref 0–0.1)
Eosinophils Absolute: 0 10*3/uL (ref 0–0.7)
Eosinophils Relative: 1 %
HEMATOCRIT: 40.8 % (ref 35.0–47.0)
Hemoglobin: 13.5 g/dL (ref 12.0–16.0)
LYMPHS ABS: 0.2 10*3/uL — AB (ref 1.0–3.6)
LYMPHS PCT: 5 %
MCH: 29.1 pg (ref 26.0–34.0)
MCHC: 33.1 g/dL (ref 32.0–36.0)
MCV: 87.8 fL (ref 80.0–100.0)
MONOS PCT: 1 %
Monocytes Absolute: 0 10*3/uL — ABNORMAL LOW (ref 0.2–0.9)
NEUTROS ABS: 3.4 10*3/uL (ref 1.4–6.5)
NEUTROS PCT: 93 %
Platelets: 131 10*3/uL — ABNORMAL LOW (ref 150–440)
RBC: 4.65 MIL/uL (ref 3.80–5.20)
RDW: 15.4 % — AB (ref 11.5–14.5)
WBC: 3.7 10*3/uL (ref 3.6–11.0)

## 2017-11-02 LAB — LACTIC ACID, PLASMA
Lactic Acid, Venous: 4.1 mmol/L (ref 0.5–1.9)
Lactic Acid, Venous: 3.8 mmol/L (ref 0.5–1.9)

## 2017-11-02 LAB — PROCALCITONIN: Procalcitonin: 0.24 ng/mL

## 2017-11-02 MED ORDER — ACETAMINOPHEN 650 MG RE SUPP: 650.0000 mg | Freq: Four times a day (QID) | RECTAL | Status: DC | PRN

## 2017-11-02 MED ORDER — INSULIN GLARGINE 100 UNIT/ML SOLOSTAR PEN: 50.0000 [IU] | PEN_INJECTOR | Freq: Every day | SUBCUTANEOUS | Status: DC

## 2017-11-02 MED ORDER — SODIUM CHLORIDE 0.9 % IV SOLN
INTRAVENOUS | Status: DC
  Administered 2017-11-02 – 2017-11-05 (×4): via INTRAVENOUS

## 2017-11-02 MED ORDER — ACETAMINOPHEN 325 MG PO TABS
650.0000 mg | ORAL_TABLET | Freq: Four times a day (QID) | ORAL | Status: DC | PRN
  Administered 2017-11-04 – 2017-11-05 (×2): 650 mg via ORAL
  Filled 2017-11-02 (×2): qty 2

## 2017-11-02 MED ORDER — AMIODARONE HCL 200 MG PO TABS: 400.0000 mg | ORAL_TABLET | Freq: Two times a day (BID) | ORAL | Status: DC

## 2017-11-02 MED ORDER — CEFTRIAXONE SODIUM 1 G IJ SOLR
1.0000 g | Freq: Once | INTRAMUSCULAR | Status: AC
  Administered 2017-11-02: 1 g via INTRAVENOUS
  Filled 2017-11-02: qty 10

## 2017-11-02 MED ORDER — POLYETHYLENE GLYCOL 3350 17 G PO PACK: 17.0000 g | PACK | Freq: Every day | ORAL | Status: DC | PRN

## 2017-11-02 MED ORDER — INSULIN GLARGINE 100 UNIT/ML ~~LOC~~ SOLN
50.0000 [IU] | Freq: Every day | SUBCUTANEOUS | Status: DC
  Filled 2017-11-02 (×2): qty 0.5

## 2017-11-02 MED ORDER — INSULIN ASPART 100 UNIT/ML ~~LOC~~ SOLN: 0.0000 [IU] | Freq: Every day | SUBCUTANEOUS | Status: DC

## 2017-11-02 MED ORDER — FUROSEMIDE 40 MG PO TABS: 40.0000 mg | ORAL_TABLET | Freq: Every day | ORAL | Status: DC

## 2017-11-02 MED ORDER — ONDANSETRON HCL 4 MG PO TABS: 4.0000 mg | ORAL_TABLET | Freq: Four times a day (QID) | ORAL | Status: DC | PRN

## 2017-11-02 MED ORDER — SENNOSIDES-DOCUSATE SODIUM 8.6-50 MG PO TABS: 1.0000 | ORAL_TABLET | Freq: Every evening | ORAL | Status: DC | PRN

## 2017-11-02 MED ORDER — DOCUSATE SODIUM 100 MG PO CAPS
100.0000 mg | ORAL_CAPSULE | Freq: Two times a day (BID) | ORAL | Status: DC
  Administered 2017-11-02 – 2017-11-05 (×4): 100 mg via ORAL
  Filled 2017-11-02 (×6): qty 1

## 2017-11-02 MED ORDER — SODIUM CHLORIDE 0.9 % IV SOLN: 1.0000 g | INTRAVENOUS | Status: DC

## 2017-11-02 MED ORDER — AMIODARONE HCL 200 MG PO TABS
200.0000 mg | ORAL_TABLET | Freq: Every day | ORAL | Status: DC
  Administered 2017-11-03 – 2017-11-05 (×3): 200 mg via ORAL
  Filled 2017-11-02 (×3): qty 1

## 2017-11-02 MED ORDER — ASPIRIN EC 81 MG PO TBEC
81.0000 mg | DELAYED_RELEASE_TABLET | Freq: Every day | ORAL | Status: DC
  Filled 2017-11-02: qty 1

## 2017-11-02 MED ORDER — SODIUM CHLORIDE 0.9 % IV BOLUS
1000.0000 mL | Freq: Once | INTRAVENOUS | Status: AC
Start: 2017-11-02 — End: 2017-11-02
  Administered 2017-11-02: 1000 mL via INTRAVENOUS

## 2017-11-02 MED ORDER — INSULIN ASPART 100 UNIT/ML ~~LOC~~ SOLN
0.0000 [IU] | Freq: Three times a day (TID) | SUBCUTANEOUS | Status: DC
  Administered 2017-11-03: 1 [IU] via SUBCUTANEOUS
  Administered 2017-11-03: 2 [IU] via SUBCUTANEOUS
  Administered 2017-11-03 – 2017-11-04 (×3): 1 [IU] via SUBCUTANEOUS
  Administered 2017-11-05: 2 [IU] via SUBCUTANEOUS
  Filled 2017-11-02 (×6): qty 1

## 2017-11-02 MED ORDER — SODIUM CHLORIDE 0.9 % IV BOLUS
1000.0000 mL | Freq: Once | INTRAVENOUS | Status: AC
  Administered 2017-11-02: 1000 mL via INTRAVENOUS

## 2017-11-02 MED ORDER — ENOXAPARIN SODIUM 40 MG/0.4ML ~~LOC~~ SOLN
40.0000 mg | Freq: Two times a day (BID) | SUBCUTANEOUS | Status: DC
  Administered 2017-11-02 – 2017-11-03 (×3): 40 mg via SUBCUTANEOUS
  Filled 2017-11-02 (×3): qty 0.4

## 2017-11-02 MED ORDER — DOXYCYCLINE HYCLATE 100 MG PO TABS
100.0000 mg | ORAL_TABLET | Freq: Once | ORAL | Status: AC
  Administered 2017-11-02: 100 mg via ORAL
  Filled 2017-11-02: qty 1

## 2017-11-02 MED ORDER — ACETAMINOPHEN 500 MG PO TABS
1000.0000 mg | ORAL_TABLET | Freq: Once | ORAL | Status: AC
  Administered 2017-11-02: 1000 mg via ORAL
  Filled 2017-11-02: qty 2

## 2017-11-02 MED ORDER — ENOXAPARIN SODIUM 40 MG/0.4ML ~~LOC~~ SOLN: 40.0000 mg | SUBCUTANEOUS | Status: DC

## 2017-11-02 MED ORDER — SODIUM CHLORIDE 0.9 % IV SOLN
500.0000 mg | INTRAVENOUS | Status: DC
  Administered 2017-11-02: 500 mg via INTRAVENOUS
  Filled 2017-11-02 (×2): qty 500

## 2017-11-02 MED ORDER — ATORVASTATIN CALCIUM 20 MG PO TABS
40.0000 mg | ORAL_TABLET | Freq: Every day | ORAL | Status: DC
  Administered 2017-11-02 – 2017-11-04 (×3): 40 mg via ORAL
  Filled 2017-11-02 (×3): qty 2

## 2017-11-02 MED ORDER — ONDANSETRON HCL 4 MG/2ML IJ SOLN: 4.0000 mg | Freq: Four times a day (QID) | INTRAMUSCULAR | Status: DC | PRN

## 2017-11-02 MED ORDER — METOPROLOL TARTRATE 25 MG PO TABS
25.0000 mg | ORAL_TABLET | Freq: Two times a day (BID) | ORAL | Status: DC
  Administered 2017-11-03 – 2017-11-05 (×5): 25 mg via ORAL
  Filled 2017-11-02 (×6): qty 1

## 2017-11-02 NOTE — Progress Notes (Signed)
CODE SEPSIS - PHARMACY COMMUNICATION  **Broad Spectrum Antibiotics should be administered within 1 hour of Sepsis diagnosis**  Time Code Sepsis Called/Page Received: 1632  Antibiotics Ordered: Ceftriaxone   Time of 1st antibiotic administration: 1527  Additional action taken by pharmacy: N/A  If necessary, Name of Provider/Nurse Contacted: [place] ,PharmD Pharmacy Resident  11/02/2017  4:41 PM

## 2017-11-02 NOTE — Progress Notes (Signed)
Family Meeting Note  Advance Directive:no  Today a meeting took place with the Patient.   The following clinical team members were present during this meeting:MD  The following were discussed:Patient's diagnosis: sepsis PNA , Patient's progosis: > 12 months and Goals for treatment: Full Code  Additional follow-up to be provided: chaplain consult to start advanced directives  Time spent during discussion:16 minutes  Frederick Marro, MD

## 2017-11-02 NOTE — Care Management (Signed)
I do not see an advanced directive on file. Patient's cousin Gregary Signs is listed at contact. Per record, patient went to [facility] in January of this year. It appears that patient may be open to [facility] now. I am checking status with Floydene Flock.

## 2017-11-02 NOTE — Progress Notes (Signed)
Pharmacy Antibiotic Note  [name] is a 67 y.o. female admitted on 11/02/2017 with pneumonia(CAP).  Pharmacy has been consulted for ceftriaxone dosing.  Plan: Ceftriaxone 1g IV q24h    Height: 5\' 6"  (167.6 cm) Weight: (!) 335 lb 1.6 oz (152 kg) IBW/kg (Calculated) : 59.3  Temp (24hrs), Avg:103.1 F (39.5 C), Min:103.1 F (39.5 C), Max:103.1 F (39.5 C)  Recent Labs  Lab 11/02/17 1417  WBC 3.7  CREATININE 1.65*  LATICACIDVEN 4.1*    Estimated Creatinine Clearance: 51 mL/min (A) (by C-G formula based on SCr of 1.65 mg/dL (H)).    Allergies  Allergen Reactions  . Tetanus Toxoids Swelling  . Lisinopril Rash  . Niacin Rash  . Sitagliptin Rash  . Sulfamethoxazole-Trimethoprim Rash    Antimicrobials this admission: 5/10 Ceftriaxone >>  5/10 Doxycycline >> once  Dose adjustments this admission:   Microbiology results: 5/10 BCx: sent 5/10 UCx: sent   Thank you for allowing pharmacy to be a part of this patient's care.  Candelaria Stagers, PharmD Pharmacy Resident  11/02/2017 3:45 PM

## 2017-11-02 NOTE — ED Triage Notes (Signed)
Pt arrived via ems for c/o altered mental status and painful urination - pt home health aide stated that pt "was not herself" - ems reports that pt mental status has declined while in route

## 2017-11-02 NOTE — ED Notes (Signed)
Lactic of 4.1 reported to Dr Alfred Levins - no new orders at this time

## 2017-11-02 NOTE — ED Provider Notes (Signed)
[facility] Emergency Department Provider Note  ____________________________________________  Time seen: Approximately 2:20 PM  I have reviewed the triage vital signs and the nursing notes.   HISTORY  Chief Complaint Altered Mental Status  Level 5 caveat:  Portions of the history and physical were unable to be obtained due to AMS   HPI [name] is a 67 y.o. female with a history of hypertension, hyperlipidemia, diastolic CHF, diabetes, coronary artery disease who presents for evaluation of altered mental status.  Patient came from home via EMS after home health nurse called them for fever and confusion.  According to home health nurse she saw patient 2 days ago and patient was complaining of dysuria.  She never follow-up with primary care doctor.  Today patient was confused and had a fever.  According to EMS vitals were normal upon arrival.  In route patient became more confused. Patient had several episodes of NBNB today.  Patient is confused, keeps repeating herself one answer words.    Past Medical History:  Diagnosis Date  . Bacteremia   . CAD (coronary artery disease)   . Diabetes mellitus without complication ([place])   . Diastolic CHF ([place])   . Hyperlipemia   . Hypertension   . Lymphedema   . MI (myocardial infarction) ([place])   . OSA (obstructive sleep apnea)   . Severe aortic stenosis     Patient Active Problem List   Diagnosis Date Noted  . Sepsis ([place]) 11/02/2017  . Wide-complex tachycardia ([place]) 07/11/2017  . Aortic stenosis 05/21/2017  . Syncope, near 05/19/2017  . Chronic diastolic heart failure ([place]) 07/27/2016  . HTN (hypertension) 07/27/2016  . Obstructive sleep apnea 07/27/2016  . Cellulitis 07/27/2016  . Pressure injury of skin 05/02/2016  . Lactic acidosis   . Severe aortic stenosis   . OSA on CPAP   . Morbid obesity ([place])   . Community acquired pneumonia     Past Surgical History:  Procedure Laterality Date  .  none      Prior to Admission medications   Medication Sig Start Date End Date Taking? Authorizing Provider  amiodarone (PACERONE) 400 MG tablet Take 1 tablet (400 mg total) by mouth 2 (two) times daily. 07/16/17   Fritzi Mandes, MD  aspirin EC 81 MG tablet Take 81 mg by mouth daily.    [provider]  atorvastatin (LIPITOR) 40 MG tablet Take 1 tablet (40 mg total) by mouth daily at 6 PM. 05/09/16   Epifanio Lesches, MD  docusate sodium (COLACE) 100 MG capsule Take 1 capsule (100 mg total) by mouth 2 (two) times daily. 07/16/17   Fritzi Mandes, MD  fenofibrate (TRICOR) 145 MG tablet Take 1 tablet by mouth daily. 04/17/16   [provider]  furosemide (LASIX) 40 MG tablet Take 1 tablet (40 mg total) by mouth daily. 07/17/16   Vaughan Basta, MD  LANTUS SOLOSTAR 100 UNIT/ML Solostar Pen Inject 50 Units into the skin daily. Titrate for morning sugars less than 120 04/11/16   [provider]  metFORMIN (GLUCOPHAGE) 1000 MG tablet Take 1 tablet (1,000 mg total) by mouth daily with breakfast. 05/22/17   Max Sane, MD  metoprolol tartrate (LOPRESSOR) 25 MG tablet Take 1 tablet (25 mg total) by mouth 2 (two) times daily. 05/22/17   Max Sane, MD  nystatin (MYCOSTATIN/NYSTOP) powder Apply topically 4 (four) times daily.  04/18/16   [provider]  polyethylene glycol (MIRALAX / GLYCOLAX) packet Take 17 g by mouth daily  as needed for mild constipation. 07/16/17   Fritzi Mandes, MD  potassium chloride (K-DUR) 10 MEQ tablet Take 1 tablet by mouth daily. 03/14/17   [provider]  triamcinolone cream (KENALOG) 0.1 % Apply 1 application topically daily. 11/29/16   [provider]    Allergies Tetanus toxoids; Lisinopril; Niacin; Sitagliptin; and Sulfamethoxazole-trimethoprim  Family History  Problem Relation Age of Onset  . Cancer Mother   . Heart failure Father     Social History Social History   Tobacco Use  . Smoking status: Never  Smoker  . Smokeless tobacco: Never Used  Substance Use Topics  . Alcohol use: No  . Drug use: No    Review of Systems  Constitutional: + fever and confusion Gastrointestinal: + nausea, and vomiting Genitourinary: + dysuria.  ____________________________________________   PHYSICAL EXAM:  VITAL SIGNS: ED Triage Vitals  Enc Vitals Group     BP 11/02/17 1412 129/72     Pulse Rate 11/02/17 1412 82     Resp 11/02/17 1412 20     Temp 11/02/17 1412 (!) 103.1 F (39.5 C)     Temp Source 11/02/17 1412 Oral     SpO2 11/02/17 1412 90 %     Weight 11/02/17 1413 (!) 335 lb 1.6 oz (152 kg)     Height 11/02/17 1413 5\' 6"  (1.676 m)     Head Circumference --      Peak Flow --      Pain Score 11/02/17 1413 0     Pain Loc --      Pain Edu? --      Excl. in [place]? --     Constitutional: Alert and oriented. Well appearing and in no apparent distress. HEENT:      Head: Normocephalic and atraumatic.         Eyes: Conjunctivae are normal. Sclera is non-icteric.       Mouth/Throat: Mucous membranes are moist.       Neck: Supple with no signs of meningismus. Cardiovascular: Regular rate and rhythm. No murmurs, gallops, or rubs. 2+ symmetrical distal pulses are present in all extremities. No JVD. Respiratory: Normal respiratory effort. Lungs are clear to auscultation bilaterally. No wheezes, crackles, or rhonchi.  Gastrointestinal: Soft, non tender, and non distended with positive bowel sounds. No rebound or guarding. Genitourinary: No CVA tenderness. Musculoskeletal: Nontender with normal range of motion in all extremities. No edema, cyanosis, or erythema of extremities. Neurologic: Normal speech and language. Face is symmetric. Moving all extremities. No gross focal neurologic deficits are appreciated. Skin: Skin is warm, dry and intact. No rash noted. Psychiatric: Mood and affect are normal. Speech and behavior are normal.  ____________________________________________   LABS (all labs  ordered are listed, but only abnormal results are displayed)  Labs Reviewed  COMPREHENSIVE METABOLIC PANEL - Abnormal; Notable for the following components:      Result Value   Glucose, Bld 149 (*)    BUN 51 (*)    Creatinine, Ser 1.65 (*)    AST 62 (*)    GFR calc non Af Amer 31 (*)    GFR calc Af Amer 36 (*)    All other components within normal limits  CBC WITH DIFFERENTIAL/PLATELET - Abnormal; Notable for the following components:   RDW 15.4 (*)    Platelets 131 (*)    Lymphs Abs 0.2 (*)    Monocytes Absolute 0.0 (*)    All other components within normal limits  LACTIC ACID, PLASMA - Abnormal; Notable  for the following components:   Lactic Acid, Venous 4.1 (*)    All other components within normal limits  CULTURE, BLOOD (ROUTINE X 2)  CULTURE, BLOOD (ROUTINE X 2)  URINE CULTURE  LACTIC ACID, PLASMA  URINALYSIS, COMPLETE (UACMP) WITH MICROSCOPIC  CBG MONITORING, ED   ____________________________________________  EKG  ED ECG REPORT I, Rudene Re, the attending physician, personally viewed and interpreted this ECG.  Ventricular paced rhythm with a rate of 88, prolonged QTC, normal axis, no concordant ST elevation. ____________________________________________  RADIOLOGY  I have personally reviewed the images performed during this visit and I agree with the Radiologist's read.   Interpretation by Radiologist:  Dg Chest Port 1 View  Result Date: 11/02/2017 CLINICAL DATA:  67 year old female with altered mental status, painful urination, possible sepsis. EXAM: PORTABLE CHEST 1 VIEW COMPARISON:  Chest CTA 07/11/2017 and earlier. FINDINGS: Portable AP upright view at 1409 hours. A left chest dual lead cardiac pacemaker is new since January and cardiomegaly appears regressed since that time. Similar lung volumes. Mild perihilar crowding of markings and atelectasis, including linear opacity lateral to the left hilum. Increased pulmonary interstitial opacity, perhaps  vascularity. No pneumothorax, pleural effusion or consolidation. Paucity of bowel gas in the upper abdomen. IMPRESSION: 1. A left chest dual lead cardiac pacemaker is new since January and cardiomegaly appears regressed since that time. 2. Atelectasis and increased pulmonary interstitium/vascularity. Consider mild interstitial edema or viral/atypical respiratory infection. Electronically Signed   By: Genevie Ann M.D.   On: 11/02/2017 14:52     ____________________________________________   PROCEDURES  Procedure(s) performed: None Procedures Critical Care performed: yes  CRITICAL CARE Performed by: Rudene Re  ?  Total critical care time: 30 min  Critical care time was exclusive of separately billable procedures and treating other patients.  Critical care was necessary to treat or prevent imminent or life-threatening deterioration.  Critical care was time spent personally by me on the following activities: development of treatment plan with patient and/or surrogate as well as nursing, discussions with consultants, evaluation of patient's response to treatment, examination of patient, obtaining history from patient or surrogate, ordering and performing treatments and interventions, ordering and review of laboratory studies, ordering and review of radiographic studies, pulse oximetry and re-evaluation of patient's condition.  ____________________________________________   INITIAL IMPRESSION / ASSESSMENT AND PLAN / ED COURSE  67 y.o. female with a history of hypertension, hyperlipidemia, diastolic CHF, diabetes, coronary artery disease who presents for evaluation of altered mental status and fever the setting of several days of dysuria.  Patient is alert and oriented x2.  She will answer one question yes or no and then keep repeating the answer over and over again.  Unable to give any more elaborate answers.  She has no meningeal signs, she does have a temperature of 103.1, her lungs are  clear to auscultation, she is morbidly obese, she has dry yellow emesis on her clothes, her abdomen is soft and nontender.  At this time patient does have a fever but does not meet sepsis criteria otherwise.  Presentation concerning for UTI/Pyelo/urosepsis versus pneumonia versus bacteremia. Plan for labs, lactic, blood cx, UA, CXR. Will give tylenol, IVF, zofran    _________________________ 3:09 PM on 11/02/2017 -----------------------------------------  Chest x-ray concerning for pneumonia. Lactic acid elevated at 4.1. Normal WBC. Patient meets sepsis criteria. Patient was started on Rocephin, doxycycline, and IVF. Patient will be admitted to the Hospitalist    As part of my medical decision making, I reviewed the following  data within the [place] notes reviewed and incorporated, Labs reviewed , EKG interpreted , Old chart reviewed, Radiograph reviewed , Discussed with admitting physician , Notes from prior ED visits and [place] Controlled Substance Database    Pertinent labs & imaging results that were available during my care of the patient were reviewed by me and considered in my medical decision making (see chart for details).    ____________________________________________   FINAL CLINICAL IMPRESSION(S) / ED DIAGNOSES  Final diagnoses:  Fever, unspecified fever cause  Encephalopathy acute  Sepsis, due to unspecified organism [facility])  Community acquired pneumonia, unspecified laterality      NEW MEDICATIONS STARTED DURING THIS VISIT:  ED Discharge Orders    None       Note:  This document was prepared using Dragon voice recognition software and may include unintentional dictation errors.    Rudene Re, MD 11/02/17 [phone]

## 2017-11-02 NOTE — Progress Notes (Signed)
Per protocol, will transition patient from enoxaparin 40mg  SQ daily to enoxaparin 40mg  SQ twice daily due to patient BMI >40 and Crcl >93ml/min.   Candelaria Stagers, PharmD Pharmacy Resident

## 2017-11-02 NOTE — H&P (Signed)
[name] NAME: [name]    MR#:  [id number]  DATE OF BIRTH:  1950/09/22  DATE OF ADMISSION:  11/02/2017  PRIMARY CARE PHYSICIAN: Frazier Richards, MD   REQUESTING/REFERRING PHYSICIAN: dr Alfred Levins  CHIEF COMPLAINT:  AMS  HISTORY OF PRESENT ILLNESS:  [name]  is a 67 y.o. female with a known history of HTN, diastolic heart failure and diabetes who presents to ED with altered mental status. Patient is now at her baseline as far as mental status is concerned.  Approximately 2 days ago she saw her home health nurse and was complaining of dysuria.  This morning she was noted to be confused and had a fever and was brought to the ER for further evaluation.  In the emergency room apparently she was confused however on my examination she is no longer confused.  She was found to have a fever of 103.  Chest x-ray was concerning for pneumonia.  She was started on Rocephin and doxycycline.  PAST MEDICAL HISTORY:   Past Medical History:  Diagnosis Date  . Bacteremia   . CAD (coronary artery disease)   . Diabetes mellitus without complication ([place])   . Diastolic CHF ([place])   . Hyperlipemia   . Hypertension   . Lymphedema   . MI (myocardial infarction) ([place])   . OSA (obstructive sleep apnea)   . Severe aortic stenosis     PAST SURGICAL HISTORY:   Past Surgical History:  Procedure Laterality Date  . none      SOCIAL HISTORY:   Social History   Tobacco Use  . Smoking status: Never Smoker  . Smokeless tobacco: Never Used  Substance Use Topics  . Alcohol use: No    FAMILY HISTORY:   Family History  Problem Relation Age of Onset  . Cancer Mother   . Heart failure Father     DRUG ALLERGIES:   Allergies  Allergen Reactions  . Tetanus Toxoids Swelling  . Lisinopril Rash  . Niacin Rash  . Sitagliptin Rash  . Sulfamethoxazole-Trimethoprim Rash    REVIEW OF SYSTEMS:   Review of Systems  Constitutional:  Positive for fever. Negative for chills and malaise/fatigue.  HENT: Negative.  Negative for ear discharge, ear pain, hearing loss, nosebleeds and sore throat.   Eyes: Negative.  Negative for blurred vision and pain.  Respiratory: Positive for cough. Negative for hemoptysis, shortness of breath and wheezing.   Cardiovascular: Negative.  Negative for chest pain, palpitations and leg swelling.  Gastrointestinal: Negative.  Negative for abdominal pain, blood in stool, diarrhea, nausea and vomiting.  Genitourinary: Negative.  Negative for dysuria.  Musculoskeletal: Negative.  Negative for back pain.  Skin: Negative.   Neurological: Negative for dizziness, tremors, speech change, focal weakness, seizures and headaches.  Endo/Heme/Allergies: Negative.  Does not bruise/bleed easily.  Psychiatric/Behavioral: Positive for memory loss. Negative for depression, hallucinations and suicidal ideas.    MEDICATIONS AT HOME:   Prior to Admission medications   Medication Sig Start Date End Date Taking? Authorizing Provider  amiodarone (PACERONE) 400 MG tablet Take 1 tablet (400 mg total) by mouth 2 (two) times daily. 07/16/17   Fritzi Mandes, MD  aspirin EC 81 MG tablet Take 81 mg by mouth daily.    [provider]  atorvastatin (LIPITOR) 40 MG tablet Take 1 tablet (40 mg total) by mouth daily at 6 PM. 05/09/16   Epifanio Lesches, MD  docusate sodium (COLACE) 100 MG capsule Take 1  capsule (100 mg total) by mouth 2 (two) times daily. 07/16/17   Fritzi Mandes, MD  fenofibrate (TRICOR) 145 MG tablet Take 1 tablet by mouth daily. 04/17/16   [provider]  furosemide (LASIX) 40 MG tablet Take 1 tablet (40 mg total) by mouth daily. 07/17/16   Vaughan Basta, MD  LANTUS SOLOSTAR 100 UNIT/ML Solostar Pen Inject 50 Units into the skin daily. Titrate for morning sugars less than 120 04/11/16   [provider]  metFORMIN (GLUCOPHAGE) 1000 MG tablet Take 1 tablet (1,000 mg total) by mouth  daily with breakfast. 05/22/17   Max Sane, MD  metoprolol tartrate (LOPRESSOR) 25 MG tablet Take 1 tablet (25 mg total) by mouth 2 (two) times daily. 05/22/17   Max Sane, MD  nystatin (MYCOSTATIN/NYSTOP) powder Apply topically 4 (four) times daily.  04/18/16   [provider]  polyethylene glycol (MIRALAX / GLYCOLAX) packet Take 17 g by mouth daily as needed for mild constipation. 07/16/17   Fritzi Mandes, MD  potassium chloride (K-DUR) 10 MEQ tablet Take 1 tablet by mouth daily. 03/14/17   [provider]  triamcinolone cream (KENALOG) 0.1 % Apply 1 application topically daily. 11/29/16   [provider]      VITAL SIGNS:  Blood pressure 129/72, pulse 82, temperature (!) 103.1 F (39.5 C), temperature source Oral, resp. rate (!) 35, height 5\' 6"  (1.676 m), weight (!) 152 kg (335 lb 1.6 oz), SpO2 93 %.  PHYSICAL EXAMINATION:   Physical Exam  Constitutional: She is oriented to person, place, and time. No distress.  HENT:  Head: Normocephalic.  Eyes: No scleral icterus.  Neck: Normal range of motion. Neck supple. No JVD present. No tracheal deviation present.  Cardiovascular: Normal rate and regular rhythm. Exam reveals no gallop and no friction rub.  Murmur heard. Pulmonary/Chest: Effort normal and breath sounds normal. No respiratory distress. She has no wheezes. She has no rales. She exhibits no tenderness.  Abdominal: Soft. Bowel sounds are normal. She exhibits no distension and no mass. There is no tenderness. There is no rebound and no guarding.  Musculoskeletal: Normal range of motion. She exhibits no edema.  Neurological: She is alert and oriented to person, place, and time.  Skin: Skin is warm. No rash noted. No erythema.  Psychiatric: Judgment normal.      LABORATORY PANEL:   CBC Recent Labs  Lab 11/02/17 1417  WBC 3.7  HGB 13.5  HCT 40.8  PLT 131*    ------------------------------------------------------------------------------------------------------------------  Chemistries  Recent Labs  Lab 11/02/17 1417  NA 139  K 4.5  CL 101  CO2 24  GLUCOSE 149*  BUN 51*  CREATININE 1.65*  CALCIUM 9.2  AST 62*  ALT 47  ALKPHOS 79  BILITOT 1.0   ------------------------------------------------------------------------------------------------------------------  Cardiac Enzymes No results for input(s): TROPONINI in the last 168 hours. ------------------------------------------------------------------------------------------------------------------  RADIOLOGY:  Dg Chest Port 1 View  Result Date: 11/02/2017 CLINICAL DATA:  67 year old female with altered mental status, painful urination, possible sepsis. EXAM: PORTABLE CHEST 1 VIEW COMPARISON:  Chest CTA 07/11/2017 and earlier. FINDINGS: Portable AP upright view at 1409 hours. A left chest dual lead cardiac pacemaker is new since January and cardiomegaly appears regressed since that time. Similar lung volumes. Mild perihilar crowding of markings and atelectasis, including linear opacity lateral to the left hilum. Increased pulmonary interstitial opacity, perhaps vascularity. No pneumothorax, pleural effusion or consolidation. Paucity of bowel gas in the upper abdomen. IMPRESSION: 1. A left chest dual lead cardiac pacemaker is  new since January and cardiomegaly appears regressed since that time. 2. Atelectasis and increased pulmonary interstitium/vascularity. Consider mild interstitial edema or viral/atypical respiratory infection. Electronically Signed   By: Genevie Ann M.D.   On: 11/02/2017 14:52    EKG:  Ventricular paced rhythm no ST elevation or depression  IMPRESSION AND PLAN:   67 year old female with history of CAD and diabetes who presents emergency room with altered mental status.  1.  Acute encephalopathy in the setting of sepsis with pneumonia Mental status seems to have  improved  2.  Sepsis: Patient presented to the emergency room with fever and tachycardia.  Sepsis due to pneumonia Follow lactic acid level   3.  Community acquired pneumonia: Continue Rocephin and azithromycin  4.  Diabetes: Sliding scale insulin, ADA diet and Lantus ordered   All the records are reviewed and case discussed with ED provider. Management plans discussed with the patient and she is in agreement  CODE STATUS: FULL  TOTAL TIME TAKING CARE OF THIS PATIENT: 41 minutes.    Aylana Hirschfeld M.D on 11/02/2017 at 3:08 PM  Between 7am to 6pm - Pager - [phone]  After 6pm go to [email or web address] - password [email or web address] [place] [facility]  Office  [phone]  CC: Primary care physician; Frazier Richards, MD

## 2017-11-03 LAB — BASIC METABOLIC PANEL
Anion gap: 11 (ref 5–15)
BUN: 56 mg/dL — ABNORMAL HIGH (ref 6–20)
CALCIUM: 7.9 mg/dL — AB (ref 8.9–10.3)
CHLORIDE: 108 mmol/L (ref 101–111)
CO2: 20 mmol/L — ABNORMAL LOW (ref 22–32)
CREATININE: 2.36 mg/dL — AB (ref 0.44–1.00)
GFR calc non Af Amer: 20 mL/min — ABNORMAL LOW (ref >60)
GFR, EST AFRICAN AMERICAN: 24 mL/min — AB (ref >60)
Glucose, Bld: 139 mg/dL — ABNORMAL HIGH (ref 65–99)
Potassium: 3.1 mmol/L — ABNORMAL LOW (ref 3.5–5.1)
SODIUM: 139 mmol/L (ref 135–145)

## 2017-11-03 LAB — BLOOD CULTURE ID PANEL (REFLEXED)
Acinetobacter baumannii: NOT DETECTED
CANDIDA GLABRATA: NOT DETECTED
CANDIDA KRUSEI: NOT DETECTED
CANDIDA TROPICALIS: NOT DETECTED
Candida albicans: NOT DETECTED
Candida parapsilosis: NOT DETECTED
Carbapenem resistance: NOT DETECTED
ESCHERICHIA COLI: NOT DETECTED
Enterobacter cloacae complex: NOT DETECTED
Enterobacteriaceae species: DETECTED — AB
Enterococcus species: NOT DETECTED
Haemophilus influenzae: NOT DETECTED
KLEBSIELLA OXYTOCA: NOT DETECTED
Klebsiella pneumoniae: NOT DETECTED
Listeria monocytogenes: NOT DETECTED
NEISSERIA MENINGITIDIS: NOT DETECTED
PROTEUS SPECIES: DETECTED — AB
Pseudomonas aeruginosa: NOT DETECTED
SERRATIA MARCESCENS: NOT DETECTED
STAPHYLOCOCCUS AUREUS BCID: NOT DETECTED
STAPHYLOCOCCUS SPECIES: NOT DETECTED
Streptococcus agalactiae: NOT DETECTED
Streptococcus pneumoniae: NOT DETECTED
Streptococcus pyogenes: NOT DETECTED
Streptococcus species: NOT DETECTED

## 2017-11-03 LAB — CBC
HCT: 37.5 % (ref 35.0–47.0)
Hemoglobin: 12.6 g/dL (ref 12.0–16.0)
MCH: 29.9 pg (ref 26.0–34.0)
MCHC: 33.6 g/dL (ref 32.0–36.0)
MCV: 89 fL (ref 80.0–100.0)
PLATELETS: 91 10*3/uL — AB (ref 150–440)
RBC: 4.22 MIL/uL (ref 3.80–5.20)
RDW: 15.8 % — AB (ref 11.5–14.5)
WBC: 13.1 10*3/uL — AB (ref 3.6–11.0)

## 2017-11-03 LAB — GLUCOSE, CAPILLARY
GLUCOSE-CAPILLARY: 135 mg/dL — AB (ref 65–99)
GLUCOSE-CAPILLARY: 170 mg/dL — AB (ref 65–99)
Glucose-Capillary: 140 mg/dL — ABNORMAL HIGH (ref 65–99)
Glucose-Capillary: 141 mg/dL — ABNORMAL HIGH (ref 65–99)

## 2017-11-03 MED ORDER — INSULIN GLARGINE 100 UNIT/ML ~~LOC~~ SOLN
15.0000 [IU] | Freq: Every day | SUBCUTANEOUS | Status: DC
  Administered 2017-11-03 – 2017-11-05 (×3): 15 [IU] via SUBCUTANEOUS
  Filled 2017-11-03 (×4): qty 0.15

## 2017-11-03 MED ORDER — SODIUM CHLORIDE 0.9 % IV SOLN
2.0000 g | INTRAVENOUS | Status: DC
  Administered 2017-11-03 – 2017-11-05 (×3): 2 g via INTRAVENOUS
  Filled 2017-11-03 (×2): qty 2
  Filled 2017-11-03 (×2): qty 20

## 2017-11-03 MED ORDER — DOXYCYCLINE HYCLATE 100 MG PO TABS
100.0000 mg | ORAL_TABLET | Freq: Two times a day (BID) | ORAL | Status: DC
  Administered 2017-11-04 – 2017-11-05 (×3): 100 mg via ORAL
  Filled 2017-11-03 (×3): qty 1

## 2017-11-03 NOTE — Evaluation (Signed)
Physical Therapy Evaluation Patient Details Name: [name] MRN: [id number] DOB: [birth year] Today's Date: 11/03/2017   History of Present Illness  Patient is a 67 year old female admitted for acute encephalopathy and sepsis. PMH includes aortic stenosis, MI, lymphedema, Htn, diastolic CHF, DM. CAD and bacteremia.  Clinical Impression  Patient is a 67 year old female who lives in a one story apartment alone.  She is dependent on an aide for ADL's and uses a bariatric RW at baseline.  She is in bed upon PT arrival and reports no pain.  She is able to perform bed mobility slowly but with no assistance and sit at EOB independently.  Pt able to stand from bedside and lower herself to [facility] with supervision.  Pt did require min A to stand from [facility] but PT was unable to adjust BSC to appropriate height.  Pt able to ambulate around bed to recliner with supervision and VC's for appropriate RW placement. Pt states that she normally uses a bariatric RW and is better able to handle her own RW.  PT introduced there ex to pt and she was able to complete sets of 10 seated in recliner.  Pt will benefit from skilled PT with focus on strength, balance, tolerance to activity, functional mobility and safe use of RW.    Follow Up Recommendations Home health PT    Equipment Recommendations  None recommended by PT    Recommendations for Other Services       Precautions / Restrictions Precautions Precautions: Fall Restrictions Weight Bearing Restrictions: No      Mobility  Bed Mobility Overal bed mobility: Modified Independent             General bed mobility comments: Moves very slowly but able to sit at EOB with use of bedrail.  Transfers Overall transfer level: Needs assistance Equipment used: Rolling walker (2 wheeled) Transfers: Sit to/from Stand Sit to Stand: Min assist         General transfer comment: Pt able to stand from recliner and elevated bed but requires min A to stand from  lower BSC.  Pt states that her bed at home is the height of the hospital bed when raised.  Ambulation/Gait Ambulation/Gait assistance: Supervision Ambulation Distance (Feet): 20 Feet Assistive device: Rolling walker (2 wheeled)     Gait velocity interpretation: 1.31 - 2.62 ft/sec, indicative of limited community ambulator General Gait Details: Moderate foot clearance, decreased hip and knee flexion, pushes RW anteriorly but states that she has a bariatric RW at home which she is more capable of using.  Stairs            Wheelchair Mobility    Modified Rankin (Stroke Patients Only)       Balance Overall balance assessment: Modified Independent                                           Pertinent Vitals/Pain Pain Assessment: 0-10 Pain Score: 0-No pain Pain Location: Reports no pain at rest but that she sometimes experiences pain across low back when standing. Immediately alleviated when she sits.    Home Living Family/patient expects to be discharged to:: Private residence Living Arrangements: Alone Available Help at Discharge: Available PRN/intermittently;Personal care attendant Type of Home: Apartment Home Access: Stairs to enter Entrance Stairs-Rails: None Entrance Stairs-Number of Steps: 2 Home Layout: One level Home Equipment: Bedside commode  Prior Function Level of Independence: Needs assistance   Gait / Transfers Assistance Needed: Uses RW in home and rollator outside of home.  Requires assistance when she goes outside.  Cousin takes her to her appointments and shopping, though not always available.  ADL's / Homemaking Assistance Needed: Receives assistance with bathing        Hand Dominance        Extremity/Trunk Assessment   Upper Extremity Assessment Upper Extremity Assessment: Overall WFL for tasks assessed    Lower Extremity Assessment Lower Extremity Assessment: Overall WFL for tasks assessed    Cervical / Trunk  Assessment Cervical / Trunk Assessment: Kyphotic  Communication      Cognition Arousal/Alertness: Awake/alert Behavior During Therapy: WFL for tasks assessed/performed Overall Cognitive Status: Within Functional Limits for tasks assessed                                 General Comments: A&O x4      General Comments      Exercises General Exercises - Lower Extremity Ankle Circles/Pumps: AROM;10 reps;Both;Seated Heel Slides: Strengthening;Both;10 reps;Seated Hip ABduction/ADduction: AROM;10 reps;Both;Seated Straight Leg Raises: Strengthening;Both;10 reps;Seated(Partial) Other Exercises Other Exercises: Provided education concerning appropriate execution of ther ex and importance of frequency of exercise.   Assessment/Plan    PT Assessment Patient needs continued PT services  PT Problem List Decreased strength;Decreased mobility;Decreased activity tolerance;Decreased balance;Decreased knowledge of use of DME       PT Treatment Interventions DME instruction;Functional mobility training;Balance training;Gait training;Therapeutic activities;Therapeutic exercise;Stair training;Neuromuscular re-education;Patient/family education    PT Goals (Current goals can be found in the Care Plan section)  Acute Rehab PT Goals Patient Stated Goal: To return home and continue to be routinely active around her house. PT Goal Formulation: With patient Time For Goal Achievement: 11/03/17 Potential to Achieve Goals: Good    Frequency Min 2X/week   Barriers to discharge        Co-evaluation               AM-PAC PT "6 Clicks" Daily Activity  Outcome Measure Difficulty turning over in bed (including adjusting bedclothes, sheets and blankets)?: A Little Difficulty moving from lying on back to sitting on the side of the bed? : A Little Difficulty sitting down on and standing up from a chair with arms (e.g., wheelchair, bedside commode, etc,.)?: A Little Help needed moving  to and from a bed to chair (including a wheelchair)?: A Little Help needed walking in hospital room?: A Little Help needed climbing 3-5 steps with a railing? : A Little 6 Click Score: 18    End of Session Equipment Utilized During Treatment: Gait belt Activity Tolerance: Patient tolerated treatment well Patient left: in chair;with call bell/phone within reach;with chair alarm set Nurse Communication: Mobility status PT Visit Diagnosis: Unsteadiness on feet (R26.81);Muscle weakness (generalized) (M62.81)    Time: 1440-1510 PT Time Calculation (min) (ACUTE ONLY): 30 min   Charges:   PT Evaluation $PT Eval Low Complexity: 1 Low PT Treatments $Therapeutic Exercise: 8-22 mins   PT G Codes:   PT G-Codes **NOT FOR INPATIENT CLASS** Functional Assessment Tool Used: AM-PAC 6 Clicks Basic Mobility    Roxanne Gates, PT, DPT   Roxanne Gates 11/03/2017, 5:01 PM

## 2017-11-03 NOTE — Progress Notes (Signed)
Patient ID: [name], female   DOB: 11-28-1950, 67 y.o.   MRN: [id number]  [facility] PROGRESS NOTE  Deletha Jaffee Spahr PNT:[id number] DOB: 1950-09-21 DOA: 11/02/2017 PCP: Frazier Richards, MD  HPI/Subjective: Patient feeling weak.  Not feeling well.  Patient states that she was in rehab from January to April.  She recently had a pacemaker and aortic valve replacement by TAVR in February.  Found to be septic.  Objective: Vitals:   11/03/17 1127 11/03/17 1254  BP: (!) 116/55   Pulse: 70 73  Resp:    Temp:  98.2 F (36.8 C)  SpO2:  95%    Filed Weights   11/02/17 1413 11/02/17 1717 11/03/17 0425  Weight: (!) 152 kg (335 lb 1.6 oz) 124.8 kg (275 lb 2.2 oz) 129.4 kg (285 lb 3.2 oz)    ROS: Review of Systems  Constitutional: Positive for malaise/fatigue. Negative for chills and fever.  Eyes: Negative for blurred vision.  Respiratory: Positive for cough and shortness of breath.   Cardiovascular: Negative for chest pain.  Gastrointestinal: Negative for abdominal pain, constipation, diarrhea, nausea and vomiting.  Genitourinary: Negative for dysuria.  Musculoskeletal: Negative for joint pain.  Neurological: Negative for dizziness and headaches.   Exam: Physical Exam  Constitutional: She is oriented to person, place, and time.  HENT:  Nose: No mucosal edema.  Mouth/Throat: No oropharyngeal exudate or posterior oropharyngeal edema.  Eyes: Pupils are equal, round, and reactive to light. Conjunctivae, EOM and lids are normal.  Neck: No JVD present. Carotid bruit is not present. No edema present. No thyroid mass and no thyromegaly present.  Cardiovascular: S1 normal and S2 normal. Exam reveals no gallop.  No murmur heard. Pulses:      Dorsalis pedis pulses are 2+ on the right side, and 2+ on the left side.  Respiratory: No respiratory distress. She has decreased breath sounds in the right lower field and the left lower field. She has no wheezes. She has no rhonchi. She  has no rales.  GI: Soft. Bowel sounds are normal. There is no tenderness.  Musculoskeletal:       Right ankle: She exhibits swelling.       Left ankle: She exhibits swelling.  Lymphadenopathy:    She has no cervical adenopathy.  Neurological: She is alert and oriented to person, place, and time. No cranial nerve deficit.  Skin: Skin is warm. No rash noted. Nails show no clubbing.  Psychiatric: She has a normal mood and affect.      Data Reviewed: Basic Metabolic Panel: Recent Labs  Lab 11/02/17 1417 11/03/17 0619  NA 139 139  K 4.5 3.1*  CL 101 108  CO2 24 20*  GLUCOSE 149* 139*  BUN 51* 56*  CREATININE 1.65* 2.36*  CALCIUM 9.2 7.9*   Liver Function Tests: Recent Labs  Lab 11/02/17 1417  AST 62*  ALT 47  ALKPHOS 79  BILITOT 1.0  PROT 7.4  ALBUMIN 3.9   No results for input(s): LIPASE, AMYLASE in the last 168 hours. No results for input(s): AMMONIA in the last 168 hours. CBC: Recent Labs  Lab 11/02/17 1417 11/03/17 0619  WBC 3.7 13.1*  NEUTROABS 3.4  --   HGB 13.5 12.6  HCT 40.8 37.5  MCV 87.8 89.0  PLT 131* 91*   Cardiac Enzymes: No results for input(s): CKTOTAL, CKMB, CKMBINDEX, TROPONINI in the last 168 hours. BNP (last 3 results) Recent Labs    05/18/17 2224 07/11/17 1734  BNP 953.0* 2,017.0*  ProBNP (last 3 results) No results for input(s): PROBNP in the last 8760 hours.  CBG: Recent Labs  Lab 11/02/17 1728 11/02/17 2102 11/03/17 0744 11/03/17 1157  GLUCAP 110* 116* 135* 170*    Recent Results (from the past 240 hour(s))  Blood Culture (routine x 2)     Status: None (Preliminary result)   Collection Time: 11/02/17  2:17 PM  Result Value Ref Range Status   Specimen Description   Final    BLOOD RIGHT HAND Performed at [facility], [address], [place], [place] [address]    Special Requests   Final    BOTTLES DRAWN AEROBIC AND ANAEROBIC Blood Culture results may not be optimal due to an inadequate volume of blood  received in culture bottles Performed at [facility], [address], [place], [place] [address]    Culture  Setup Time   Final    GRAM NEGATIVE RODS IN BOTH AEROBIC AND ANAEROBIC BOTTLES CRITICAL RESULT CALLED TO, READ BACK BY AND VERIFIED WITH: DAVID BESANTI AT 0100 11/03/17.PMH    Culture GRAM NEGATIVE RODS  Final   Report Status PENDING  Incomplete  Blood Culture (routine x 2)     Status: None (Preliminary result)   Collection Time: 11/02/17  2:17 PM  Result Value Ref Range Status   Specimen Description   Final    BLOOD RIGHT HAND Performed at [facility], [address] [place], [place] [address]    Special Requests   Final    BOTTLES DRAWN AEROBIC AND ANAEROBIC Blood Culture adequate volume Performed at [facility], [address], [place], [place] [address]    Culture  Setup Time   Final    GRAM NEGATIVE RODS ANAEROBIC BOTTLE ONLY CRITICAL RESULT CALLED TO, READ BACK BY AND VERIFIED WITH: DAVID BESANTI AT 0100 11/03/17.PMH    Culture GRAM NEGATIVE RODS  Final   Report Status PENDING  Incomplete  Blood Culture ID Panel (Reflexed)     Status: Abnormal   Collection Time: 11/02/17  2:17 PM  Result Value Ref Range Status   Enterococcus species NOT DETECTED NOT DETECTED Final   Listeria monocytogenes NOT DETECTED NOT DETECTED Final   Staphylococcus species NOT DETECTED NOT DETECTED Final   Staphylococcus aureus NOT DETECTED NOT DETECTED Final   Streptococcus species NOT DETECTED NOT DETECTED Final   Streptococcus agalactiae NOT DETECTED NOT DETECTED Final   Streptococcus pneumoniae NOT DETECTED NOT DETECTED Final   Streptococcus pyogenes NOT DETECTED NOT DETECTED Final   Acinetobacter baumannii NOT DETECTED NOT DETECTED Final   Enterobacteriaceae species DETECTED (A) NOT DETECTED Final    Comment: Enterobacteriaceae represent a large family of gram-negative bacteria, not a single organism. CRITICAL RESULT CALLED TO, READ BACK BY AND VERIFIED  WITH: DAVID BESANTI AT 0100 11/03/17.PMH    Enterobacter cloacae complex NOT DETECTED NOT DETECTED Final   Escherichia coli NOT DETECTED NOT DETECTED Final   Klebsiella oxytoca NOT DETECTED NOT DETECTED Final   Klebsiella pneumoniae NOT DETECTED NOT DETECTED Final   Proteus species DETECTED (A) NOT DETECTED Final    Comment: CRITICAL RESULT CALLED TO, READ BACK BY AND VERIFIED WITH: DAVID BESANTI AT 0100 11/03/17.PMH    Serratia marcescens NOT DETECTED NOT DETECTED Final   Carbapenem resistance NOT DETECTED NOT DETECTED Final   Haemophilus influenzae NOT DETECTED NOT DETECTED Final   Neisseria meningitidis NOT DETECTED NOT DETECTED Final   Pseudomonas aeruginosa NOT DETECTED NOT DETECTED Final   Candida albicans NOT DETECTED NOT DETECTED  Final   Candida glabrata NOT DETECTED NOT DETECTED Final   Candida krusei NOT DETECTED NOT DETECTED Final   Candida parapsilosis NOT DETECTED NOT DETECTED Final   Candida tropicalis NOT DETECTED NOT DETECTED Final    Comment: Performed at [facility], [address] [place], [place] [address]  MRSA PCR Screening     Status: None   Collection Time: 11/02/17  6:10 PM  Result Value Ref Range Status   MRSA by PCR NEGATIVE NEGATIVE Final    Comment:        The GeneXpert MRSA Assay (FDA approved for NASAL specimens only), is one component of a comprehensive MRSA colonization surveillance program. It is not intended to diagnose MRSA infection nor to guide or monitor treatment for MRSA infections. Performed at [facility], [address], [place], [place] [address]      Studies: Dg Chest [place] 1 View  Result Date: 11/02/2017 CLINICAL DATA:  67 year old female with altered mental status, painful urination, possible sepsis. EXAM: PORTABLE CHEST 1 VIEW COMPARISON:  Chest CTA 07/11/2017 and earlier. FINDINGS: Portable AP upright view at 1409 hours. A left chest dual lead cardiac pacemaker is new since January and cardiomegaly  appears regressed since that time. Similar lung volumes. Mild perihilar crowding of markings and atelectasis, including linear opacity lateral to the left hilum. Increased pulmonary interstitial opacity, perhaps vascularity. No pneumothorax, pleural effusion or consolidation. Paucity of bowel gas in the upper abdomen. IMPRESSION: 1. A left chest dual lead cardiac pacemaker is new since January and cardiomegaly appears regressed since that time. 2. Atelectasis and increased pulmonary interstitium/vascularity. Consider mild interstitial edema or viral/atypical respiratory infection. Electronically Signed   By: Genevie Ann M.D.   On: 11/02/2017 14:52    Scheduled Meds: . amiodarone  200 mg Oral Daily  . aspirin EC  81 mg Oral Daily  . atorvastatin  40 mg Oral q1800  . docusate sodium  100 mg Oral BID  . enoxaparin (LOVENOX) injection  40 mg Subcutaneous Q12H  . insulin aspart  0-5 Units Subcutaneous QHS  . insulin aspart  0-9 Units Subcutaneous TID WC  . insulin glargine  15 Units Subcutaneous Daily  . metoprolol tartrate  25 mg Oral BID   Continuous Infusions: . sodium chloride 50 mL/hr at 11/03/17 1122  . azithromycin Stopped (11/02/17 1910)  . cefTRIAXone (ROCEPHIN)  IV Stopped (11/03/17 1751)    Assessment/Plan:  1. Sepsis secondary to Proteus pneumonia.  On Rocephin and doxycycline.  Follow-up sensitivities of blood cultures.  Will request ID consultation for Monday. 2. Acute kidney injury on IV fluid hydration hold medications that can affect the kidney function. 3. Type 2 diabetes mellitus decrease dose of glargine insulin down to 15 units daily and put on sliding scale. 4. Hyperlipidemia unspecified on atorvastatin 5. 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  History of diastolic congestive heart failure.  Currently on metoprolol and amiodarone and aspirin. 6. Morbid obesity.  Weight loss needed 7. Weakness.  Physical therapy evaluation  Code Status:     Code  Status Orders  (From admission, onward)        Start     Ordered   11/02/17 1716  Full code  Continuous     11/02/17 1715    Code Status History    Date Active Date Inactive Code Status Order ID Comments User Context   07/11/2017 2043 07/16/2017 1807 Full Code [id number]  Hillary Bow, MD ED   05/19/2017 0141 05/22/2017  1953 Full Code [id number]  Saundra Shelling, MD Inpatient   07/14/2016 1119 07/19/2016 0028 Full Code [id number]  Vaughan Basta, MD Inpatient   07/14/2016 0105 07/14/2016 1119 DNR [id number]  Dustin Flock, MD Inpatient   07/13/2016 1917 07/14/2016 0105 Full Code [id number]  Dustin Flock, MD ED   05/02/2016 1221 05/09/2016 2123 DNR [id number]  Vilinda Boehringer, MD ED      Disposition Plan: Will be in the hospital until sensitivities back on Proteus blood cultures.  Antibiotics:  Rocephin  Doxycycline  Time spent: 28 minutes  [place]

## 2017-11-03 NOTE — Progress Notes (Signed)
PHARMACY - PHYSICIAN COMMUNICATION CRITICAL VALUE ALERT - BLOOD CULTURE IDENTIFICATION (BCID)  [name] is an 67 y.o. female who presented to [facility] on 11/02/2017 with a chief complaint of AMS  Assessment:  Febrile (103.1), tachypneic, systolic 20'N, LA 4.1 >> 3.8   Name of physician (or Provider) Contacted: Elmer Picker  Current antibiotics: Azithromycin/Ceftriaxone   Changes to prescribed antibiotics recommended:  Recommendations accepted by provider -- Will continue current abx and increase ceftriaxone to 2g IV daily for possible proteus bacteremia.  Results for orders placed or performed during the hospital encounter of 11/02/17  Blood Culture ID Panel (Reflexed) (Collected: 11/02/2017  2:17 PM)  Result Value Ref Range   Enterococcus species NOT DETECTED NOT DETECTED   Listeria monocytogenes NOT DETECTED NOT DETECTED   Staphylococcus species NOT DETECTED NOT DETECTED   Staphylococcus aureus NOT DETECTED NOT DETECTED   Streptococcus species NOT DETECTED NOT DETECTED   Streptococcus agalactiae NOT DETECTED NOT DETECTED   Streptococcus pneumoniae NOT DETECTED NOT DETECTED   Streptococcus pyogenes NOT DETECTED NOT DETECTED   Acinetobacter baumannii NOT DETECTED NOT DETECTED   Enterobacteriaceae species DETECTED (A) NOT DETECTED   Enterobacter cloacae complex NOT DETECTED NOT DETECTED   Escherichia coli NOT DETECTED NOT DETECTED   Klebsiella oxytoca NOT DETECTED NOT DETECTED   Klebsiella pneumoniae NOT DETECTED NOT DETECTED   Proteus species DETECTED (A) NOT DETECTED   Serratia marcescens NOT DETECTED NOT DETECTED   Carbapenem resistance NOT DETECTED NOT DETECTED   Haemophilus influenzae NOT DETECTED NOT DETECTED   Neisseria meningitidis NOT DETECTED NOT DETECTED   Pseudomonas aeruginosa NOT DETECTED NOT DETECTED   Candida albicans NOT DETECTED NOT DETECTED   Candida glabrata NOT DETECTED NOT DETECTED   Candida krusei NOT DETECTED NOT DETECTED   Candida  parapsilosis NOT DETECTED NOT DETECTED   Candida tropicalis NOT DETECTED NOT DETECTED   Tobie Lords, PharmD, BCPS Clinical Pharmacist 11/03/2017

## 2017-11-04 ENCOUNTER — Inpatient Hospital Stay: Payer: Medicare Other

## 2017-11-04 LAB — BASIC METABOLIC PANEL
ANION GAP: 7 (ref 5–15)
BUN: 56 mg/dL — ABNORMAL HIGH (ref 6–20)
CALCIUM: 7.5 mg/dL — AB (ref 8.9–10.3)
CHLORIDE: 107 mmol/L (ref 101–111)
CO2: 23 mmol/L (ref 22–32)
Creatinine, Ser: 1.7 mg/dL — ABNORMAL HIGH (ref 0.44–1.00)
GFR calc non Af Amer: 30 mL/min — ABNORMAL LOW (ref >60)
GFR, EST AFRICAN AMERICAN: 35 mL/min — AB (ref >60)
Glucose, Bld: 110 mg/dL — ABNORMAL HIGH (ref 65–99)
Potassium: 3.2 mmol/L — ABNORMAL LOW (ref 3.5–5.1)
Sodium: 137 mmol/L (ref 135–145)

## 2017-11-04 LAB — GLUCOSE, CAPILLARY
GLUCOSE-CAPILLARY: 126 mg/dL — AB (ref 65–99)
GLUCOSE-CAPILLARY: 150 mg/dL — AB (ref 65–99)
GLUCOSE-CAPILLARY: 145 mg/dL — AB (ref 65–99)
Glucose-Capillary: 112 mg/dL — ABNORMAL HIGH (ref 65–99)

## 2017-11-04 LAB — URINE CULTURE: CULTURE: NO GROWTH

## 2017-11-04 MED ORDER — APIXABAN 5 MG PO TABS
5.0000 mg | ORAL_TABLET | Freq: Two times a day (BID) | ORAL | Status: DC
  Administered 2017-11-04 – 2017-11-05 (×3): 5 mg via ORAL
  Filled 2017-11-04 (×3): qty 1

## 2017-11-04 MED ORDER — POTASSIUM CHLORIDE CRYS ER 20 MEQ PO TBCR
20.0000 meq | EXTENDED_RELEASE_TABLET | Freq: Once | ORAL | Status: AC
  Administered 2017-11-04: 20 meq via ORAL
  Filled 2017-11-04: qty 1

## 2017-11-04 NOTE — Progress Notes (Signed)
Patient ID: [name], female   DOB: 1951-04-07, 67 y.o.   MRN: [id number]  [facility] PROGRESS NOTE  Varvara Legault Noffke JJK:[id number] DOB: 09-28-1950 DOA: 11/02/2017 PCP: Frazier Richards, MD  HPI/Subjective: Patient feeling a little bit better today.  Offers no complaints.  States she takes Eliquis at home.  Patient complains of bilateral pain in her thighs.  Objective: Vitals:   11/04/17 1107 11/04/17 1359  BP: (!) 117/58 (!) 127/54  Pulse: 60 (!) 59  Resp:  16  Temp:  98.1 F (36.7 C)  SpO2:  95%    Filed Weights   11/02/17 1413 11/02/17 1717 11/03/17 0425  Weight: (!) 152 kg (335 lb 1.6 oz) 124.8 kg (275 lb 2.2 oz) 129.4 kg (285 lb 3.2 oz)    ROS: Review of Systems  Constitutional: Positive for malaise/fatigue. Negative for chills and fever.  Eyes: Negative for blurred vision.  Respiratory: Positive for cough and shortness of breath.   Cardiovascular: Negative for chest pain.  Gastrointestinal: Negative for abdominal pain, constipation, diarrhea, nausea and vomiting.  Genitourinary: Negative for dysuria.  Musculoskeletal: Positive for joint pain.  Neurological: Negative for dizziness and headaches.   Exam: Physical Exam  Constitutional: She is oriented to person, place, and time.  HENT:  Nose: No mucosal edema.  Mouth/Throat: No oropharyngeal exudate or posterior oropharyngeal edema.  Eyes: Pupils are equal, round, and reactive to light. Conjunctivae, EOM and lids are normal.  Neck: No JVD present. Carotid bruit is not present. No edema present. No thyroid mass and no thyromegaly present.  Cardiovascular: S1 normal and S2 normal. Exam reveals no gallop.  No murmur heard. Pulses:      Dorsalis pedis pulses are 2+ on the right side, and 2+ on the left side.  Respiratory: No respiratory distress. She has decreased breath sounds in the right lower field and the left lower field. She has no wheezes. She has no rhonchi. She has no rales.  GI: Soft. Bowel  sounds are normal. There is no tenderness.  Musculoskeletal:       Right ankle: She exhibits swelling.       Left ankle: She exhibits swelling.  Lymphadenopathy:    She has no cervical adenopathy.  Neurological: She is alert and oriented to person, place, and time. No cranial nerve deficit.  Skin: Skin is warm. No rash noted. Nails show no clubbing.  Psychiatric: She has a normal mood and affect.      Data Reviewed: Basic Metabolic Panel: Recent Labs  Lab 11/02/17 1417 11/03/17 0619 11/04/17 0816  NA 139 139 137  K 4.5 3.1* 3.2*  CL 101 108 107  CO2 24 20* 23  GLUCOSE 149* 139* 110*  BUN 51* 56* 56*  CREATININE 1.65* 2.36* 1.70*  CALCIUM 9.2 7.9* 7.5*   Liver Function Tests: Recent Labs  Lab 11/02/17 1417  AST 62*  ALT 47  ALKPHOS 79  BILITOT 1.0  PROT 7.4  ALBUMIN 3.9   CBC: Recent Labs  Lab 11/02/17 1417 11/03/17 0619  WBC 3.7 13.1*  NEUTROABS 3.4  --   HGB 13.5 12.6  HCT 40.8 37.5  MCV 87.8 89.0  PLT 131* 91*    CBG: Recent Labs  Lab 11/03/17 1157 11/03/17 1641 11/03/17 2138 11/04/17 0803 11/04/17 1249  GLUCAP 170* 140* 141* 112* 126*    Recent Results (from the past 240 hour(s))  Blood Culture (routine x 2)     Status: Abnormal (Preliminary result)   Collection Time: 11/02/17  2:17 PM  Result Value Ref Range Status   Specimen Description   Final    BLOOD RIGHT HAND Performed at [facility], [address] [place], [place] [address]    Special Requests   Final    BOTTLES DRAWN AEROBIC AND ANAEROBIC Blood Culture results may not be optimal due to an inadequate volume of blood received in culture bottles Performed at [facility], [address], [place], [place] [address]    Culture  Setup Time   Final    GRAM NEGATIVE RODS IN BOTH AEROBIC AND ANAEROBIC BOTTLES CRITICAL RESULT CALLED TO, READ BACK BY AND VERIFIED WITH: DAVID BESANTI AT 0100 11/03/17.PMH    Culture PROTEUS MIRABILIS (A)  Final   Report Status  PENDING  Incomplete  Blood Culture (routine x 2)     Status: Abnormal (Preliminary result)   Collection Time: 11/02/17  2:17 PM  Result Value Ref Range Status   Specimen Description   Final    BLOOD RIGHT HAND Performed at [facility], [address], [place], [place] [address]    Special Requests   Final    BOTTLES DRAWN AEROBIC AND ANAEROBIC Blood Culture adequate volume Performed at [facility], [address], [place], [place] [address]    Culture  Setup Time   Final    GRAM NEGATIVE RODS IN BOTH AEROBIC AND ANAEROBIC BOTTLES CRITICAL RESULT CALLED TO, READ BACK BY AND VERIFIED WITH: DAVID BESANTI AT 0100 11/03/17.PMH    Culture PROTEUS MIRABILIS (A)  Final   Report Status PENDING  Incomplete  Blood Culture ID Panel (Reflexed)     Status: Abnormal   Collection Time: 11/02/17  2:17 PM  Result Value Ref Range Status   Enterococcus species NOT DETECTED NOT DETECTED Final   Listeria monocytogenes NOT DETECTED NOT DETECTED Final   Staphylococcus species NOT DETECTED NOT DETECTED Final   Staphylococcus aureus NOT DETECTED NOT DETECTED Final   Streptococcus species NOT DETECTED NOT DETECTED Final   Streptococcus agalactiae NOT DETECTED NOT DETECTED Final   Streptococcus pneumoniae NOT DETECTED NOT DETECTED Final   Streptococcus pyogenes NOT DETECTED NOT DETECTED Final   Acinetobacter baumannii NOT DETECTED NOT DETECTED Final   Enterobacteriaceae species DETECTED (A) NOT DETECTED Final    Comment: Enterobacteriaceae represent a large family of gram-negative bacteria, not a single organism. CRITICAL RESULT CALLED TO, READ BACK BY AND VERIFIED WITH: DAVID BESANTI AT 0100 11/03/17.PMH    Enterobacter cloacae complex NOT DETECTED NOT DETECTED Final   Escherichia coli NOT DETECTED NOT DETECTED Final   Klebsiella oxytoca NOT DETECTED NOT DETECTED Final   Klebsiella pneumoniae NOT DETECTED NOT DETECTED Final   Proteus species DETECTED (A) NOT DETECTED Final     Comment: CRITICAL RESULT CALLED TO, READ BACK BY AND VERIFIED WITH: DAVID BESANTI AT 0100 11/03/17.PMH    Serratia marcescens NOT DETECTED NOT DETECTED Final   Carbapenem resistance NOT DETECTED NOT DETECTED Final   Haemophilus influenzae NOT DETECTED NOT DETECTED Final   Neisseria meningitidis NOT DETECTED NOT DETECTED Final   Pseudomonas aeruginosa NOT DETECTED NOT DETECTED Final   Candida albicans NOT DETECTED NOT DETECTED Final   Candida glabrata NOT DETECTED NOT DETECTED Final   Candida krusei NOT DETECTED NOT DETECTED Final   Candida parapsilosis NOT DETECTED NOT DETECTED Final   Candida tropicalis NOT DETECTED NOT DETECTED Final    Comment: Performed at [facility], [address], [place], [place] [address]  Urine culture     Status:  None   Collection Time: 11/02/17  5:31 PM  Result Value Ref Range Status   Specimen Description   Final    URINE, RANDOM Performed at [facility], [address] [place], [place] [address]    Special Requests   Final    NONE Performed at [facility], [address], [place], [place] [address]    Culture   Final    NO GROWTH Performed at [facility], [place] [address], [place], [place] [address]    Report Status 11/04/2017 FINAL  Final  MRSA PCR Screening     Status: None   Collection Time: 11/02/17  6:10 PM  Result Value Ref Range Status   MRSA by PCR NEGATIVE NEGATIVE Final    Comment:        The GeneXpert MRSA Assay (FDA approved for NASAL specimens only), is one component of a comprehensive MRSA colonization surveillance program. It is not intended to diagnose MRSA infection nor to guide or monitor treatment for MRSA infections. Performed at [facility], [address] [place], [place] [address]      Studies: US Venous Img Lower Bilateral  Result Date: 11/04/2017 CLINICAL DATA:  67 year old female with a history of leg pain EXAM: BILATERAL LOWER EXTREMITY VENOUS DOPPLER  ULTRASOUND TECHNIQUE: Gray-scale sonography with graded compression, as well as color Doppler and duplex ultrasound were performed to evaluate the lower extremity deep venous systems from the level of the common femoral vein and including the common femoral, femoral, profunda femoral, popliteal and calf veins including the posterior tibial, peroneal and gastrocnemius veins when visible. The superficial great saphenous vein was also interrogated. Spectral Doppler was utilized to evaluate flow at rest and with distal augmentation maneuvers in the common femoral, femoral and popliteal veins. COMPARISON:  None. FINDINGS: RIGHT LOWER EXTREMITY Common Femoral Vein: No evidence of thrombus. Normal compressibility, respiratory phasicity and response to augmentation. Saphenofemoral Junction: No evidence of thrombus. Normal compressibility and flow on color Doppler imaging. Profunda Femoral Vein: No evidence of thrombus. Normal compressibility and flow on color Doppler imaging. Femoral Vein: No evidence of thrombus. Normal compressibility, respiratory phasicity and response to augmentation. Popliteal Vein: No evidence of thrombus. Normal compressibility, respiratory phasicity and response to augmentation. Calf Veins: No evidence of thrombus. Normal compressibility and flow on color Doppler imaging. Superficial Great Saphenous Vein: No evidence of thrombus. Normal compressibility and flow on color Doppler imaging. Other Findings:  None. LEFT LOWER EXTREMITY Common Femoral Vein: No evidence of thrombus. Normal compressibility, respiratory phasicity and response to augmentation. Saphenofemoral Junction: No evidence of thrombus. Normal compressibility and flow on color Doppler imaging. Profunda Femoral Vein: No evidence of thrombus. Normal compressibility and flow on color Doppler imaging. Femoral Vein: No evidence of thrombus. Normal compressibility, respiratory phasicity and response to augmentation. Popliteal Vein: No evidence  of thrombus. Normal compressibility, respiratory phasicity and response to augmentation. Calf Veins: No evidence of thrombus. Normal compressibility and flow on color Doppler imaging. Superficial Great Saphenous Vein: No evidence of thrombus. Normal compressibility and flow on color Doppler imaging. Other Findings:  None. IMPRESSION: Sonographic survey of the bilateral lower extremities negative for DVT Electronically Signed   By: Corrie Mckusick D.O.   On: 11/04/2017 12:44    Scheduled Meds: . amiodarone  200 mg Oral Daily  . apixaban  5 mg Oral BID  . atorvastatin  40 mg Oral q1800  . docusate sodium  100 mg Oral BID  . doxycycline  100 mg Oral Q12H  .  insulin aspart  0-5 Units Subcutaneous QHS  . insulin aspart  0-9 Units Subcutaneous TID WC  . insulin glargine  15 Units Subcutaneous Daily  . metoprolol tartrate  25 mg Oral BID   Continuous Infusions: . sodium chloride 30 mL/hr at 11/04/17 1109  . cefTRIAXone (ROCEPHIN)  IV Stopped (11/04/17 0240)    Assessment/Plan:  1. Sepsis secondary to Proteus pneumonia.  On Rocephin and doxycycline.  Follow-up sensitivities of blood cultures.  Will request ID consultation for Monday. 2. Acute kidney injury on IV fluid hydration.  hold medications that can affect the kidney function. 3. Type 2 diabetes mellitus decrease dose of glargine insulin down to 15 units daily and put on sliding scale. 4. Hyperlipidemia unspecified on atorvastatin 5. 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  History of diastolic congestive heart failure.  Currently on metoprolol and amiodarone and Eliquis. 6. Morbid obesity.  Weight loss needed 7. Weakness.  Physical therapy evaluation  Code Status:     Code Status Orders  (From admission, onward)        Start     Ordered   11/02/17 1716  Full code  Continuous     11/02/17 1715    Code Status History    Date Active Date Inactive Code Status Order ID Comments User Context   07/11/2017 2043  07/16/2017 1807 Full Code [id number]  Hillary Bow, MD ED   05/19/2017 0141 05/22/2017 1953 Full Code [id number]  Saundra Shelling, MD Inpatient   07/14/2016 1119 07/19/2016 0028 Full Code [id number]  Vaughan Basta, MD Inpatient   07/14/2016 0105 07/14/2016 1119 DNR [id number]  Dustin Flock, MD Inpatient   07/13/2016 1917 07/14/2016 0105 Full Code [id number]  Dustin Flock, MD ED   05/02/2016 1221 05/09/2016 2123 DNR [id number]  Vilinda Boehringer, MD ED      Disposition Plan: Will be in the hospital until sensitivities back on Proteus blood cultures.  Antibiotics:  Rocephin  Doxycycline  Time spent: 26 minutes  [place]

## 2017-11-05 LAB — BASIC METABOLIC PANEL
Anion gap: 6 (ref 5–15)
BUN: 41 mg/dL — AB (ref 6–20)
CO2: 24 mmol/L (ref 22–32)
Calcium: 7.6 mg/dL — ABNORMAL LOW (ref 8.9–10.3)
Chloride: 109 mmol/L (ref 101–111)
Creatinine, Ser: 1.26 mg/dL — ABNORMAL HIGH (ref 0.44–1.00)
GFR calc Af Amer: 50 mL/min — ABNORMAL LOW (ref >60)
GFR, EST NON AFRICAN AMERICAN: 43 mL/min — AB (ref >60)
GLUCOSE: 119 mg/dL — AB (ref 65–99)
POTASSIUM: 3.4 mmol/L — AB (ref 3.5–5.1)
Sodium: 139 mmol/L (ref 135–145)

## 2017-11-05 LAB — GLUCOSE, CAPILLARY
Glucose-Capillary: 104 mg/dL — ABNORMAL HIGH (ref 65–99)
Glucose-Capillary: 153 mg/dL — ABNORMAL HIGH (ref 65–99)

## 2017-11-05 MED ORDER — AMIODARONE HCL 200 MG PO TABS: 200.0000 mg | ORAL_TABLET | Freq: Every day | ORAL | Status: DC

## 2017-11-05 MED ORDER — CEPHALEXIN 500 MG PO CAPS: 500.0000 mg | ORAL_CAPSULE | Freq: Three times a day (TID) | ORAL | Status: DC

## 2017-11-05 MED ORDER — CEPHALEXIN 500 MG PO CAPS: 500.0000 mg | ORAL_CAPSULE | Freq: Three times a day (TID) | ORAL | 0 refills | Status: DC

## 2017-11-05 MED ORDER — DOCUSATE SODIUM 100 MG PO CAPS: 100.0000 mg | ORAL_CAPSULE | Freq: Every day | ORAL | Status: DC

## 2017-11-05 MED ORDER — LANTUS SOLOSTAR 100 UNIT/ML ~~LOC~~ SOPN: 15.0000 [IU] | PEN_INJECTOR | Freq: Every day | SUBCUTANEOUS | 0 refills | Status: DC

## 2017-11-05 MED ORDER — ACETAMINOPHEN 325 MG PO TABS: 650.0000 mg | ORAL_TABLET | Freq: Four times a day (QID) | ORAL | Status: AC | PRN

## 2017-11-05 NOTE — Progress Notes (Signed)
Patient discharge teaching given, including activity, diet, follow-up appoints, and medications. Patient verbalized understanding of all discharge instructions. IV access was d/c'd. Vitals are stable. Skin is intact except as charted in most recent assessments. Pt to be escorted out by NT, to be driven home by family.  Lalaine Overstreet  

## 2017-11-05 NOTE — Care Management Important Message (Signed)
Copy of signed IM left in patient's room.    

## 2017-11-05 NOTE — Discharge Summary (Signed)
[facility] NAME: [name]    MR#:  [id number]  DATE OF BIRTH:  01-11-1951  DATE OF ADMISSION:  11/02/2017 ADMITTING PHYSICIAN: Rudene Re, MD  DATE OF DISCHARGE: 11/05/2017  PRIMARY CARE PHYSICIAN: Frazier Richards, MD    ADMISSION DIAGNOSIS:  Encephalopathy acute [G93.40] Sepsis, due to unspecified organism ([place]) [A41.9] Fever, unspecified fever cause [R50.9] Community acquired pneumonia, unspecified laterality [J18.9]  DISCHARGE DIAGNOSIS:  Active Problems:   Sepsis ([place])   SECONDARY DIAGNOSIS:   Past Medical History:  Diagnosis Date  . Bacteremia   . CAD (coronary artery disease)   . Diabetes mellitus without complication ([place])   . Diastolic CHF ([place])   . Hyperlipemia   . Hypertension   . Lymphedema   . MI (myocardial infarction) ([place])   . OSA (obstructive sleep apnea)   . Severe aortic stenosis     HOSPITAL COURSE:   1.  Sepsis secondary to Proteus pneumonia.  The patient was initially started on Rocephin and doxycycline.  The Proteus is pansensitive.  Case discussed with Dr. Ola Spurr infectious disease and we will switch over to oral Keflex for total of 14 days treatment. 2.  Acute kidney injury which improved with IV fluid hydration.  Creatinine peaked at 2.36 and improved to 1.26 upon discharge home. 3.  Type 2 diabetes mellitus.  I decreased the dose of her glargine insulin down to 15 units daily and had on sliding scale while here.  I will keep her 15 units of insulin daily.  Can restart metformin since creatinine is better. 4.  Hyperlipidemia unspecified on atorvastatin 5.  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  History of diastolic congestive heart failure.  No signs of heart failure on this hospital stay.  Can go back on Lasix upon going home.  Currently on metoprolol, amiodarone and Eliquis. 6.  Morbid obesity.  Weight loss needed 7.  Weakness.  Physical therapy  recommended home health.  DISCHARGE CONDITIONS:   Satisfactory   CONSULTS OBTAINED:  Treatment Team:  Leonel Ramsay, MD  DRUG ALLERGIES:   Allergies  Allergen Reactions  . Tetanus Toxoids Swelling  . Lisinopril Rash  . Niacin Rash  . Sitagliptin Rash  . Sulfamethoxazole-Trimethoprim Rash    DISCHARGE MEDICATIONS:   Allergies as of 11/05/2017      Reactions   Tetanus Toxoids Swelling   Lisinopril Rash   Niacin Rash   Sitagliptin Rash   Sulfamethoxazole-trimethoprim Rash      Medication List    TAKE these medications   acetaminophen 325 MG tablet Commonly known as:  TYLENOL Take 2 tablets (650 mg total) by mouth every 6 (six) hours as needed for mild pain (or Fever >/= 101).   amiodarone 200 MG tablet Commonly known as:  PACERONE Take 1 tablet (200 mg total) by mouth daily. Start taking on:  11/06/2017 What changed:    medication strength  how much to take  when to take this   atorvastatin 40 MG tablet Commonly known as:  LIPITOR Take 1 tablet (40 mg total) by mouth daily at 6 PM.   cephALEXin 500 MG capsule Commonly known as:  KEFLEX Take 1 capsule (500 mg total) by mouth every 8 (eight) hours. Start taking on:  11/06/2017   docusate sodium 100 MG capsule Commonly known as:  COLACE Take 1 capsule (100 mg total) by mouth daily.   ELIQUIS 5 MG Tabs tablet Generic drug:  apixaban Take  1 tablet by mouth 2 (two) times daily.   fenofibrate 145 MG tablet Commonly known as:  TRICOR Take 1 tablet by mouth daily.   furosemide 40 MG tablet Commonly known as:  LASIX Take 1 tablet (40 mg total) by mouth daily.   LANTUS SOLOSTAR 100 UNIT/ML Solostar Pen Generic drug:  Insulin Glargine Inject 15 Units into the skin daily at 10 pm. Titrate for morning sugars less than 120 What changed:    how much to take  when to take this   metFORMIN 1000 MG tablet Commonly known as:  GLUCOPHAGE Take 1 tablet (1,000 mg total) by mouth daily with breakfast.    metoprolol tartrate 25 MG tablet Commonly known as:  LOPRESSOR Take 1 tablet (25 mg total) by mouth 2 (two) times daily.   nystatin powder Commonly known as:  MYCOSTATIN/NYSTOP Apply topically 4 (four) times daily.   polyethylene glycol packet Commonly known as:  MIRALAX / GLYCOLAX Take 17 g by mouth daily as needed for mild constipation.   potassium chloride 10 MEQ tablet Commonly known as:  K-DUR Take 1 tablet by mouth daily.   triamcinolone cream 0.1 % Commonly known as:  KENALOG Apply 1 application topically daily.        DISCHARGE INSTRUCTIONS:   F/u pmd six days  If you experience worsening of your admission symptoms, develop shortness of breath, life threatening emergency, suicidal or homicidal thoughts you must seek medical attention immediately by calling 911 or calling your MD immediately  if symptoms less severe.  You Must read complete instructions/literature along with all the possible adverse reactions/side effects for all the Medicines you take and that have been prescribed to you. Take any new Medicines after you have completely understood and accept all the possible adverse reactions/side effects.   Please note  You were cared for by a hospitalist during your hospital stay. If you have any questions about your discharge medications or the care you received while you were in the hospital after you are discharged, you can call the unit and asked to speak with the hospitalist on call if the hospitalist that took care of you is not available. Once you are discharged, your primary care physician will handle any further medical issues. Please note that NO REFILLS for any discharge medications will be authorized once you are discharged, as it is imperative that you return to your primary care physician (or establish a relationship with a primary care physician if you do not have one) for your aftercare needs so that they can reassess your need for medications and monitor  your lab values.    Today   CHIEF COMPLAINT:   Chief Complaint  Patient presents with  . Altered Mental Status    HISTORY OF PRESENT ILLNESS:  [name]  is a 67 y.o. female came in with altered mental status.   VITAL SIGNS:  Blood pressure 134/63, pulse (!) 59, temperature 97.8 F (36.6 C), temperature source Oral, resp. rate 20, height 5\' 6"  (1.676 m), weight 131.7 kg (290 lb 6.4 oz), SpO2 96 %.   PHYSICAL EXAMINATION:  GENERAL:  67 y.o.-year-old patient lying in the bed with no acute distress.  EYES: Pupils equal, round, reactive to light and accommodation. No scleral icterus. Extraocular muscles intact.  HEENT: Head atraumatic, normocephalic. Oropharynx and nasopharynx clear.  NECK:  Supple, no jugular venous distention. No thyroid enlargement, no tenderness.  LUNGS: Normal breath sounds bilaterally, no wheezing, rales,rhonchi or crepitation. No use of accessory muscles of  respiration.  CARDIOVASCULAR: S1, S2 normal. No murmurs, rubs, or gallops.  ABDOMEN: Soft, non-tender, non-distended. Bowel sounds present. No organomegaly or mass.  EXTREMITIES: 2+ pedal edema, No cyanosis, or clubbing.  NEUROLOGIC: Cranial nerves II through XII are intact. Muscle strength 5/5 in all extremities. Sensation intact. Gait not checked.  PSYCHIATRIC: The patient is alert and oriented x 3.  SKIN: No obvious rash, lesion, or ulcer.   DATA REVIEW:   CBC Recent Labs  Lab 11/03/17 0619  WBC 13.1*  HGB 12.6  HCT 37.5  PLT 91*    Chemistries  Recent Labs  Lab 11/02/17 1417  11/05/17 0738  NA 139   < > 139  K 4.5   < > 3.4*  CL 101   < > 109  CO2 24   < > 24  GLUCOSE 149*   < > 119*  BUN 51*   < > 41*  CREATININE 1.65*   < > 1.26*  CALCIUM 9.2   < > 7.6*  AST 62*  --   --   ALT 47  --   --   ALKPHOS 79  --   --   BILITOT 1.0  --   --    < > = values in this interval not displayed.    Microbiology Results  Results for orders placed or performed during the hospital  encounter of 11/02/17  Blood Culture (routine x 2)     Status: Abnormal (Preliminary result)   Collection Time: 11/02/17  2:17 PM  Result Value Ref Range Status   Specimen Description BLOOD RIGHT HAND  Final   Special Requests   Final    BOTTLES DRAWN AEROBIC AND ANAEROBIC Blood Culture results may not be optimal due to an inadequate volume of blood received in culture bottles   Culture  Setup Time   Final    GRAM NEGATIVE RODS IN BOTH AEROBIC AND ANAEROBIC BOTTLES CRITICAL RESULT CALLED TO, READ BACK BY AND VERIFIED WITH: DAVID BESANTI AT 0100 11/03/17.PMH    Culture PROTEUS MIRABILIS (A)  Final   Report Status PENDING  Incomplete   Organism ID, Bacteria PROTEUS MIRABILIS  Final      Susceptibility   Proteus mirabilis - MIC*    AMPICILLIN 4 SENSITIVE Sensitive     CEFAZOLIN 8 SENSITIVE Sensitive     CEFEPIME <=1 SENSITIVE Sensitive     CEFTAZIDIME <=1 SENSITIVE Sensitive     CEFTRIAXONE <=1 SENSITIVE Sensitive     CIPROFLOXACIN <=0.25 SENSITIVE Sensitive     GENTAMICIN <=1 SENSITIVE Sensitive     IMIPENEM 4 SENSITIVE Sensitive     TRIMETH/SULFA <=20 SENSITIVE Sensitive     AMPICILLIN/SULBACTAM <=2 SENSITIVE Sensitive     PIP/TAZO Value in next row Sensitive      <=4 SENSITIVEPerformed at [facility] [address], [place], [place] [address]    * PROTEUS MIRABILIS  Blood Culture (routine x 2)     Status: Abnormal (Preliminary result)   Collection Time: 11/02/17  2:17 PM  Result Value Ref Range Status   Specimen Description   Final    BLOOD RIGHT HAND Performed at [facility], [address], [place], [place] [address]    Special Requests   Final    BOTTLES DRAWN AEROBIC AND ANAEROBIC Blood Culture adequate volume Performed at [facility], [address], [place], [place] [address]    Culture  Setup Time   Final    GRAM NEGATIVE RODS IN BOTH AEROBIC  AND ANAEROBIC BOTTLES CRITICAL RESULT CALLED TO, READ BACK BY AND VERIFIED WITH: DAVID BESANTI  AT 0100 11/03/17.PMH    Culture (A)  Final    PROTEUS MIRABILIS SUSCEPTIBILITIES PERFORMED ON PREVIOUS CULTURE WITHIN THE LAST 5 DAYS. Performed at [facility], [place] [address] [place], [place] [address]    Report Status PENDING  Incomplete  Blood Culture ID Panel (Reflexed)     Status: Abnormal   Collection Time: 11/02/17  2:17 PM  Result Value Ref Range Status   Enterococcus species NOT DETECTED NOT DETECTED Final   Listeria monocytogenes NOT DETECTED NOT DETECTED Final   Staphylococcus species NOT DETECTED NOT DETECTED Final   Staphylococcus aureus NOT DETECTED NOT DETECTED Final   Streptococcus species NOT DETECTED NOT DETECTED Final   Streptococcus agalactiae NOT DETECTED NOT DETECTED Final   Streptococcus pneumoniae NOT DETECTED NOT DETECTED Final   Streptococcus pyogenes NOT DETECTED NOT DETECTED Final   Acinetobacter baumannii NOT DETECTED NOT DETECTED Final   Enterobacteriaceae species DETECTED (A) NOT DETECTED Final    Comment: Enterobacteriaceae represent a large family of gram-negative bacteria, not a single organism. CRITICAL RESULT CALLED TO, READ BACK BY AND VERIFIED WITH: DAVID BESANTI AT 0100 11/03/17.PMH    Enterobacter cloacae complex NOT DETECTED NOT DETECTED Final   Escherichia coli NOT DETECTED NOT DETECTED Final   Klebsiella oxytoca NOT DETECTED NOT DETECTED Final   Klebsiella pneumoniae NOT DETECTED NOT DETECTED Final   Proteus species DETECTED (A) NOT DETECTED Final    Comment: CRITICAL RESULT CALLED TO, READ BACK BY AND VERIFIED WITH: DAVID BESANTI AT 0100 11/03/17.PMH    Serratia marcescens NOT DETECTED NOT DETECTED Final   Carbapenem resistance NOT DETECTED NOT DETECTED Final   Haemophilus influenzae NOT DETECTED NOT DETECTED Final   Neisseria meningitidis NOT DETECTED NOT DETECTED Final   Pseudomonas aeruginosa NOT DETECTED NOT DETECTED Final   Candida albicans NOT DETECTED NOT DETECTED Final   Candida glabrata NOT DETECTED NOT DETECTED Final    Candida krusei NOT DETECTED NOT DETECTED Final   Candida parapsilosis NOT DETECTED NOT DETECTED Final   Candida tropicalis NOT DETECTED NOT DETECTED Final    Comment: Performed at [facility], [address], [place], [place] [address]  Urine culture     Status: None   Collection Time: 11/02/17  5:31 PM  Result Value Ref Range Status   Specimen Description   Final    URINE, RANDOM Performed at [facility], [address], [place], [place] [address]    Special Requests   Final    NONE Performed at [facility], [address] [place], [place] [address]    Culture   Final    NO GROWTH Performed at [facility], [place] [address], [place], [place] [address]    Report Status 11/04/2017 FINAL  Final  MRSA PCR Screening     Status: None   Collection Time: 11/02/17  6:10 PM  Result Value Ref Range Status   MRSA by PCR NEGATIVE NEGATIVE Final    Comment:        The GeneXpert MRSA Assay (FDA approved for NASAL specimens only), is one component of a comprehensive MRSA colonization surveillance program. It is not intended to diagnose MRSA infection nor to guide or monitor treatment for MRSA infections. Performed at [facility], [address] [place], [place] [address]     RADIOLOGY:  US Venous Img Lower Bilateral  Result Date: 11/04/2017 CLINICAL DATA:  67 year old female  with a history of leg pain EXAM: BILATERAL LOWER EXTREMITY VENOUS DOPPLER ULTRASOUND TECHNIQUE: Gray-scale sonography with graded compression, as well as color Doppler and duplex ultrasound were performed to evaluate the lower extremity deep venous systems from the level of the common femoral vein and including the common femoral, femoral, profunda femoral, popliteal and calf veins including the posterior tibial, peroneal and gastrocnemius veins when visible. The superficial great saphenous vein was also interrogated. Spectral Doppler was utilized to evaluate flow at  rest and with distal augmentation maneuvers in the common femoral, femoral and popliteal veins. COMPARISON:  None. FINDINGS: RIGHT LOWER EXTREMITY Common Femoral Vein: No evidence of thrombus. Normal compressibility, respiratory phasicity and response to augmentation. Saphenofemoral Junction: No evidence of thrombus. Normal compressibility and flow on color Doppler imaging. Profunda Femoral Vein: No evidence of thrombus. Normal compressibility and flow on color Doppler imaging. Femoral Vein: No evidence of thrombus. Normal compressibility, respiratory phasicity and response to augmentation. Popliteal Vein: No evidence of thrombus. Normal compressibility, respiratory phasicity and response to augmentation. Calf Veins: No evidence of thrombus. Normal compressibility and flow on color Doppler imaging. Superficial Great Saphenous Vein: No evidence of thrombus. Normal compressibility and flow on color Doppler imaging. Other Findings:  None. LEFT LOWER EXTREMITY Common Femoral Vein: No evidence of thrombus. Normal compressibility, respiratory phasicity and response to augmentation. Saphenofemoral Junction: No evidence of thrombus. Normal compressibility and flow on color Doppler imaging. Profunda Femoral Vein: No evidence of thrombus. Normal compressibility and flow on color Doppler imaging. Femoral Vein: No evidence of thrombus. Normal compressibility, respiratory phasicity and response to augmentation. Popliteal Vein: No evidence of thrombus. Normal compressibility, respiratory phasicity and response to augmentation. Calf Veins: No evidence of thrombus. Normal compressibility and flow on color Doppler imaging. Superficial Great Saphenous Vein: No evidence of thrombus. Normal compressibility and flow on color Doppler imaging. Other Findings:  None. IMPRESSION: Sonographic survey of the bilateral lower extremities negative for DVT Electronically Signed   By: Corrie Mckusick D.O.   On: 11/04/2017 12:44    Management plans  discussed with the patient, family and they are in agreement.  CODE STATUS:     Code Status Orders  (From admission, onward)        Start     Ordered   11/02/17 1716  Full code  Continuous     11/02/17 1715    Code Status History    Date Active Date Inactive Code Status Order ID Comments User Context   07/11/2017 2043 07/16/2017 1807 Full Code [id number]  Hillary Bow, MD ED   05/19/2017 0141 05/22/2017 1953 Full Code [id number]  Saundra Shelling, MD Inpatient   07/14/2016 1119 07/19/2016 0028 Full Code [id number]  Vaughan Basta, MD Inpatient   07/14/2016 0105 07/14/2016 1119 DNR [id number]  Dustin Flock, MD Inpatient   07/13/2016 1917 07/14/2016 0105 Full Code [id number]  Dustin Flock, MD ED   05/02/2016 1221 05/09/2016 2123 DNR [id number]  Vilinda Boehringer, MD ED      TOTAL TIME TAKING CARE OF THIS PATIENT: 35 minutes.    Loletha Grayer M.D on 11/05/2017 at 4:20 PM  Between 7am to 6pm - Pager - [phone]  After 6pm go to [email or web address] - password [email or web address]  [facility] Office  [phone]  CC: Primary care physician; Frazier Richards, MD

## 2017-11-05 NOTE — Care Management Note (Signed)
Case Management Note  Patient Details  Name: [name] MRN: [id number] Date of Birth: 1950-12-19   Patient to discharge home today.  Patient lives at home alone.  PCP [place].  PT has assessed patient and recommends home health PT.  Patient was recently discharged from [facility] and would like to use them again.  Referral made  To [facility] with [place].  Patient has a bariatric RW, and bsc in the home.  RNCM signing off.   Subjective/Objective:                    Action/Plan:   Expected Discharge Date:  11/05/17               Expected Discharge Plan:  [place]  In-House Referral:     Discharge planning Services  CM Consult  Post Acute Care Choice:  Home Health Choice offered to:  Patient  DME Arranged:    DME Agency:     HH Arranged:  RN, PT, OT, Nurse's Aide [place] Agency:  [place]  Status of Service:  Completed, signed off  If discussed at [place] of Stay Meetings, dates discussed:    Additional Comments:  Beverly Sessions, RN 11/05/2017, 2:02 PM

## 2017-11-06 LAB — CULTURE, BLOOD (ROUTINE X 2): SPECIAL REQUESTS: ADEQUATE

## 2017-11-07 ENCOUNTER — Telehealth: Payer: Self-pay

## 2017-11-07 NOTE — Telephone Encounter (Signed)
EMMI follow-up: [name] had just received a call from my number.  I explained our process of the automated calls post discharge and asked if she had any questions.  Said she her follow-up appointments have been scheduled and she has transportation arranged. She will also follow-up at [facility] on Friday re: pacemaker. Has her Rx filled that Dr. Leslye Peer had prescribed and [facility] RN will come out one more time. All the staff were very nice and she certainly appreciates it as she was so weak and needed their assistance. No needs noted at this time.

## 2017-11-08 ENCOUNTER — Telehealth: Payer: Self-pay

## 2017-11-08 NOTE — Telephone Encounter (Signed)
EMMI Follow-up: Noted on the report [name] had a unfilled prescription.  I talked with her and she said she had been on Eliquis for the last 3 months since hospitalization here in Feb. and she needed a 1 month refill as her MD wants her to gradually take her off of it over the next month. Already discussed with MD and checking to see if insurance will cover it. Stated HH came out Wed. And she wasn't feeling well and they advised to go to ED if needed.  Today is she is feeling better and will continue with other prescribed medications.  No other needs at this time.

## 2018-06-22 ENCOUNTER — Other Ambulatory Visit: Payer: Self-pay

## 2018-06-22 ENCOUNTER — Emergency Department
Admission: EM | Admit: 2018-06-22 | Discharge: 2018-06-22 | Disposition: A | Discharge status: Home / Self Care | Payer: Medicare Other | Attending: Emergency Medicine | Admitting: Emergency Medicine

## 2018-06-22 ENCOUNTER — Encounter: Payer: Self-pay | Admitting: Emergency Medicine

## 2018-06-22 DIAGNOSIS — R509 Fever, unspecified: Secondary | ICD-10-CM | POA: Insufficient documentation

## 2018-06-22 DIAGNOSIS — Z79899 Other long term (current) drug therapy: Secondary | ICD-10-CM | POA: Diagnosis not present

## 2018-06-22 DIAGNOSIS — E119 Type 2 diabetes mellitus without complications: Secondary | ICD-10-CM | POA: Insufficient documentation

## 2018-06-22 DIAGNOSIS — N390 Urinary tract infection, site not specified: Secondary | ICD-10-CM

## 2018-06-22 DIAGNOSIS — Z7901 Long term (current) use of anticoagulants: Secondary | ICD-10-CM | POA: Insufficient documentation

## 2018-06-22 DIAGNOSIS — Z794 Long term (current) use of insulin: Secondary | ICD-10-CM | POA: Diagnosis not present

## 2018-06-22 DIAGNOSIS — R159 Full incontinence of feces: Secondary | ICD-10-CM | POA: Diagnosis not present

## 2018-06-22 DIAGNOSIS — I259 Chronic ischemic heart disease, unspecified: Secondary | ICD-10-CM | POA: Diagnosis not present

## 2018-06-22 DIAGNOSIS — R32 Unspecified urinary incontinence: Secondary | ICD-10-CM | POA: Diagnosis present

## 2018-06-22 DIAGNOSIS — I5032 Chronic diastolic (congestive) heart failure: Secondary | ICD-10-CM | POA: Insufficient documentation

## 2018-06-22 DIAGNOSIS — I110 Hypertensive heart disease with heart failure: Secondary | ICD-10-CM | POA: Insufficient documentation

## 2018-06-22 LAB — URINALYSIS, COMPLETE (UACMP) WITH MICROSCOPIC
Bilirubin Urine: NEGATIVE
Glucose, UA: NEGATIVE mg/dL
Ketones, ur: NEGATIVE mg/dL
NITRITE: NEGATIVE
PH: 5 (ref 5.0–8.0)
Protein, ur: 30 mg/dL — AB
Specific Gravity, Urine: 1.009 (ref 1.005–1.030)
Squamous Epithelial / HPF: NONE SEEN (ref 0–5)
WBC, UA: >50 WBC/hpf — ABNORMAL HIGH (ref 0–5)

## 2018-06-22 LAB — COMPREHENSIVE METABOLIC PANEL
ALT: 15 U/L (ref 0–44)
AST: 19 U/L (ref 15–41)
Albumin: 3.6 g/dL (ref 3.5–5.0)
Alkaline Phosphatase: 59 U/L (ref 38–126)
Anion gap: 12 (ref 5–15)
BUN: 30 mg/dL — ABNORMAL HIGH (ref 8–23)
CO2: 22 mmol/L (ref 22–32)
Calcium: 8.5 mg/dL — ABNORMAL LOW (ref 8.9–10.3)
Chloride: 104 mmol/L (ref 98–111)
Creatinine, Ser: 1.29 mg/dL — ABNORMAL HIGH (ref 0.44–1.00)
GFR calc Af Amer: 50 mL/min — ABNORMAL LOW (ref >60)
GFR calc non Af Amer: 43 mL/min — ABNORMAL LOW (ref >60)
Glucose, Bld: 168 mg/dL — ABNORMAL HIGH (ref 70–99)
Potassium: 3.3 mmol/L — ABNORMAL LOW (ref 3.5–5.1)
Sodium: 138 mmol/L (ref 135–145)
Total Bilirubin: 1.3 mg/dL — ABNORMAL HIGH (ref 0.3–1.2)
Total Protein: 7 g/dL (ref 6.5–8.1)

## 2018-06-22 LAB — CBC
HCT: 38.2 % (ref 36.0–46.0)
Hemoglobin: 12.6 g/dL (ref 12.0–15.0)
MCH: 29 pg (ref 26.0–34.0)
MCHC: 33 g/dL (ref 30.0–36.0)
MCV: 87.8 fL (ref 80.0–100.0)
Platelets: 146 10*3/uL — ABNORMAL LOW (ref 150–400)
RBC: 4.35 MIL/uL (ref 3.87–5.11)
RDW: 12.6 % (ref 11.5–15.5)
WBC: 8.2 10*3/uL (ref 4.0–10.5)
nRBC: 0 % (ref 0.0–0.2)

## 2018-06-22 MED ORDER — SODIUM CHLORIDE 0.9 % IV SOLN
1.0000 g | Freq: Once | INTRAVENOUS | Status: AC
  Administered 2018-06-22: 1 g via INTRAVENOUS
  Filled 2018-06-22: qty 10

## 2018-06-22 MED ORDER — CEPHALEXIN 500 MG PO CAPS: 500.0000 mg | ORAL_CAPSULE | Freq: Two times a day (BID) | ORAL | 0 refills | Status: DC

## 2018-06-22 MED ORDER — ACETAMINOPHEN 325 MG PO TABS
650.0000 mg | ORAL_TABLET | Freq: Once | ORAL | Status: AC
  Administered 2018-06-22: 650 mg via ORAL
  Filled 2018-06-22: qty 2

## 2018-06-22 NOTE — ED Triage Notes (Signed)
Pt arrived via EMS from home with reports of possible UTI. Pt reports increased urination and having bladder and bowel incontinence pt reports sxs started on Friday. PT reports she is having burning with urination.  Pt does report she has [place] aide that comes daily for a few hours.  Pt has hx of aortic valve replacement and pacemaker.  Pt is alert and oriented on arrival. Pt states she has hx of sepsis back in May 2019.

## 2018-06-22 NOTE — ED Notes (Signed)
EMS called for transport back home at this time.

## 2018-06-22 NOTE — ED Provider Notes (Signed)
[facility] Emergency Department Provider Note   ____________________________________________    I have reviewed the triage vital signs and the nursing notes.   HISTORY  Chief Complaint Dysuria    HPI [name] is a 67 y.o. female who presents with complaints of dysuria.  Patient has a history of diabetes and reports a history of sepsis 6 months ago.  She reports that over the last day or 2 she has had dysuria at the end of urination.  She denies nausea or vomiting.  No fevers.  No significant back pain.  No bloating or abdominal distention.  She has not taken anything for this.  She states that she wanted to come earlyto prevent sepsis.  Review of medical record demonstrates the patient was septic from pneumonia at that time, she denies cough or shortness of breath today  Past Medical History:  Diagnosis Date  . Bacteremia   . CAD (coronary artery disease)   . Diabetes mellitus without complication ([place])   . Diastolic CHF ([place])   . Hyperlipemia   . Hypertension   . Lymphedema   . MI (myocardial infarction) ([place])   . OSA (obstructive sleep apnea)   . Severe aortic stenosis     Patient Active Problem List   Diagnosis Date Noted  . Sepsis ([place]) 11/02/2017  . Wide-complex tachycardia ([place]) 07/11/2017  . Aortic stenosis 05/21/2017  . Syncope, near 05/19/2017  . Chronic diastolic heart failure ([place]) 07/27/2016  . HTN (hypertension) 07/27/2016  . Obstructive sleep apnea 07/27/2016  . Cellulitis 07/27/2016  . Pressure injury of skin 05/02/2016  . Lactic acidosis   . Severe aortic stenosis   . OSA on CPAP   . Morbid obesity ([place])   . Community acquired pneumonia     Past Surgical History:  Procedure Laterality Date  . CARDIAC SURGERY    . none    . PACEMAKER INSERTION      Prior to Admission medications   Medication Sig Start Date End Date Taking? Authorizing Provider  acetaminophen (TYLENOL) 325 MG tablet Take 2 tablets (650 mg  total) by mouth every 6 (six) hours as needed for mild pain (or Fever >/= 101). 11/05/17   Loletha Grayer, MD  amiodarone (PACERONE) 200 MG tablet Take 1 tablet (200 mg total) by mouth daily. 11/06/17   Loletha Grayer, MD  apixaban (ELIQUIS) 5 MG TABS tablet Take 1 tablet by mouth 2 (two) times daily. 08/10/17   [provider]  atorvastatin (LIPITOR) 40 MG tablet Take 1 tablet (40 mg total) by mouth daily at 6 PM. 05/09/16   Epifanio Lesches, MD  cephALEXin (KEFLEX) 500 MG capsule Take 1 capsule (500 mg total) by mouth every 8 (eight) hours. 11/06/17   Loletha Grayer, MD  docusate sodium (COLACE) 100 MG capsule Take 1 capsule (100 mg total) by mouth daily. 11/05/17   Loletha Grayer, MD  fenofibrate (TRICOR) 145 MG tablet Take 1 tablet by mouth daily. 04/17/16   [provider]  furosemide (LASIX) 40 MG tablet Take 1 tablet (40 mg total) by mouth daily. 07/17/16   Vaughan Basta, MD  LANTUS SOLOSTAR 100 UNIT/ML Solostar Pen Inject 15 Units into the skin daily at 10 pm. Titrate for morning sugars less than 120 11/05/17   Loletha Grayer, MD  metFORMIN (GLUCOPHAGE) 1000 MG tablet Take 1 tablet (1,000 mg total) by mouth daily with breakfast. 05/22/17   Max Sane, MD  metoprolol tartrate (LOPRESSOR) 25 MG tablet Take 1 tablet (25  mg total) by mouth 2 (two) times daily. 05/22/17   Max Sane, MD  nystatin (MYCOSTATIN/NYSTOP) powder Apply topically 4 (four) times daily.  04/18/16   [provider]  polyethylene glycol (MIRALAX / GLYCOLAX) packet Take 17 g by mouth daily as needed for mild constipation. 07/16/17   Fritzi Mandes, MD  potassium chloride (K-DUR) 10 MEQ tablet Take 1 tablet by mouth daily. 03/14/17   [provider]  triamcinolone cream (KENALOG) 0.1 % Apply 1 application topically daily. 11/29/16   [provider]     Allergies Tetanus toxoid, adsorbed; Tetanus toxoid; Tetanus toxoids; Lisinopril; Niacin; Sitagliptin; and  Sulfamethoxazole-trimethoprim  Family History  Problem Relation Age of Onset  . Cancer Mother   . Heart failure Father     Social History Social History   Tobacco Use  . Smoking status: Never Smoker  . Smokeless tobacco: Never Used  Substance Use Topics  . Alcohol use: No  . Drug use: No    Review of Systems  Constitutional: No fever/chills Eyes: No visual changes.  ENT: No sore throat. Cardiovascular: Denies chest pain. Respiratory: Denies shortness of breath. Gastrointestinal: As above Genitourinary: As above Musculoskeletal: Negative for back pain. Skin: Negative for rash. Neurological: Negative for headaches   ____________________________________________   PHYSICAL EXAM:  VITAL SIGNS: ED Triage Vitals  Enc Vitals Group     BP --      Pulse Rate 06/22/18 1618 78     Resp 06/22/18 1618 18     Temp 06/22/18 1618 99.4 F (37.4 C)     Temp Source 06/22/18 1618 Oral     SpO2 06/22/18 1611 94 %     Weight --      Height --      Head Circumference --      Peak Flow --      Pain Score 06/22/18 1615 0     Pain Loc --      Pain Edu? --      Excl. in [place]? --     Constitutional: Alert and oriented. No acute distres Eyes: Conjunctivae are normal.   Nose: No congestion/rhinnorhea. Mouth/Throat: Mucous membranes are moist.    Cardiovascular: Normal rate, regular rhythm. Grossly normal heart sounds.  Good peripheral circulation. Respiratory: Normal respiratory effort.  No retractions. Lungs CTAB. Gastrointestinal: Soft and nontender. No distention.  No CVA tenderness  Musculoskeletal: No lower extremity tenderness nor edema.  Warm and well perfused Neurologic:  Normal speech and language. No gross focal neurologic deficits are appreciated.  Skin:  Skin is warm, dry and intact. No rash noted. Psychiatric: Mood and affect are normal. Speech and behavior are normal.  ____________________________________________   LABS (all labs ordered are listed, but only  abnormal results are displayed)  Labs Reviewed  CBC - Abnormal; Notable for the following components:      Result Value   Platelets 146 (*)    All other components within normal limits  COMPREHENSIVE METABOLIC PANEL - Abnormal; Notable for the following components:   Potassium 3.3 (*)    Glucose, Bld 168 (*)    BUN 30 (*)    Creatinine, Ser 1.29 (*)    Calcium 8.5 (*)    Total Bilirubin 1.3 (*)    GFR calc non Af Amer 43 (*)    GFR calc Af Amer 50 (*)    All other components within normal limits  URINALYSIS, COMPLETE (UACMP) WITH MICROSCOPIC - Abnormal; Notable for the following components:   Color, Urine YELLOW (*)  APPearance CLOUDY (*)    Hgb urine dipstick MODERATE (*)    Protein, ur 30 (*)    Leukocytes, UA LARGE (*)    WBC, UA >50 (*)    Bacteria, UA FEW (*)    All other components within normal limits  URINE CULTURE   ____________________________________________  EKG None ____________________________________________  RADIOLOGY  None ____________________________________________   PROCEDURES  Procedure(s) performed: No  Procedures   Critical Care performed: No ____________________________________________   INITIAL IMPRESSION / ASSESSMENT AND PLAN / ED COURSE  Pertinent labs & imaging results that were available during my care of the patient were reviewed by me and considered in my medical decision making (see chart for details).  Patient overall well-appearing in no acute distress, exam is reassuring no significant suprapubic tenderness.  No reports of cough or shortness of breath.  No CVA tenderness.  Will check urinalysis, labs and reevaluate  Urinalysis is consistent with UTI, lab work is otherwise reassuring.  We will treat with IV Rocephin in the ED patient appropriate for discharge with outpatient treatment   ____________________________________________   FINAL CLINICAL IMPRESSION(S) / ED DIAGNOSES  Final diagnoses:  Lower urinary tract  infectious disease        Note:  This document was prepared using Dragon voice recognition software and may include unintentional dictation errors.    Lavonia Drafts, MD 06/22/18 [phone]

## 2018-06-25 LAB — URINE CULTURE: Culture: >=100000 — AB

## 2018-07-08 ENCOUNTER — Encounter: Payer: Medicare Other | Attending: Physician Assistant | Admitting: Physician Assistant

## 2018-07-08 NOTE — Progress Notes (Signed)
JAQUELINE, UBER ([id number]) Visit Report for 07/08/2018 Arrival Information Details Patient Name: [name], [name]. Date of Service: 07/08/2018 1:15 PM Medical Record Number: [id number] Patient Account Number: [id number] Date of Birth/Sex: October 01, 1950 (67 y.o. F) Treating RN: Cornell Barman Primary Care Sandeep Radell: Rutherford Guys Other Clinician: Referring Lynzie Cliburn: Rutherford Guys Treating Matthews Franks/Extender: Melburn Hake, HOYT Weeks in Treatment: 0 Visit Information History Since Last Visit Added or deleted any medications: No Patient Arrived: [place] Any new allergies or adverse reactions: No Arrival Time: 13:16 Had a fall or experienced change in No Accompanied By: self activities of daily living that may affect Transfer Assistance: None risk of falls: Patient Identification Verified: Yes Signs or symptoms of abuse/neglect since last visito No Secondary Verification Process Completed: Yes Hospitalized since last visit: No Implantable device outside of the clinic excluding No cellular tissue based products placed in the center since last visit: Pain Present Now: No Electronic Signature(s) Signed: 07/08/2018 3:03:36 PM By: Lorine Bears RCP, RRT, CHT Entered By: Lorine Bears on 07/08/2018 13:17:23 [name] ([id number]) -------------------------------------------------------------------------------- Pain Assessment Details Patient Name: [name]. Date of Service: 07/08/2018 1:15 PM Medical Record Number: [id number] Patient Account Number: [id number] Date of Birth/Sex: 20-Dec-1950 (67 y.o. F) Treating RN: Cornell Barman Primary Care Kylina Vultaggio: Rutherford Guys Other Clinician: Referring Annistyn Depass: Rutherford Guys Treating Elya Tarquinio/Extender: Melburn Hake, HOYT Weeks in Treatment: 0 Active Problems Location of Pain Severity and Description of Pain Patient Has Paino No Site Locations Pain Management and Medication Current Pain Management: Electronic  Signature(s) Signed: 07/08/2018 3:03:36 PM By: Lorine Bears RCP, RRT, CHT Signed: 07/08/2018 3:49:31 PM By: Gretta Cool, BSN, RN, CWS, Kim RN, BSN Entered By: Lorine Bears on 07/08/2018 13:17:31 [name] ([id number]) -------------------------------------------------------------------------------- Vitals Details Patient Name: [name]. Date of Service: 07/08/2018 1:15 PM Medical Record Number: [id number] Patient Account Number: [id number] Date of Birth/Sex: 07-10-1950 (67 y.o. F) Treating RN: Cornell Barman Primary Care Threasa Kinch: Rutherford Guys Other Clinician: Referring Robina Hamor: Rutherford Guys Treating Shevette Bess/Extender: Melburn Hake, HOYT Weeks in Treatment: 0 Vital Signs Time Taken: 13:17 Temperature (F): 98.2 Height (in): 66 Pulse (bpm): 66 Source: Stated Respiratory Rate (breaths/min): 16 Weight (lbs): 237 Blood Pressure (mmHg): 147/44 Source: Measured Reference Range: 80 - 120 mg / dl Body Mass Index (BMI): 38.2 Electronic Signature(s) Signed: 07/08/2018 3:03:36 PM By: Lorine Bears RCP, RRT, CHT Entered By: Lorine Bears on 07/08/2018 13:18:29

## 2018-09-02 ENCOUNTER — Ambulatory Visit: Payer: Self-pay | Admitting: Urology

## 2018-10-21 ENCOUNTER — Ambulatory Visit: Payer: Medicare Other | Admitting: Urology

## 2018-12-02 ENCOUNTER — Ambulatory Visit: Payer: Medicare Other | Admitting: Urology

## 2019-01-03 IMAGING — CR DG CHEST 2V
1 series · 2 of 2 positions shown · non-contrast
Comparison: 05/03/2016

CLINICAL DATA: Cough and congestion

EXAM:
CHEST  2 VIEW

[Series 1: w chest lat · 0.14mm/px · 2 of 2 slices shown]
[im 1/2]
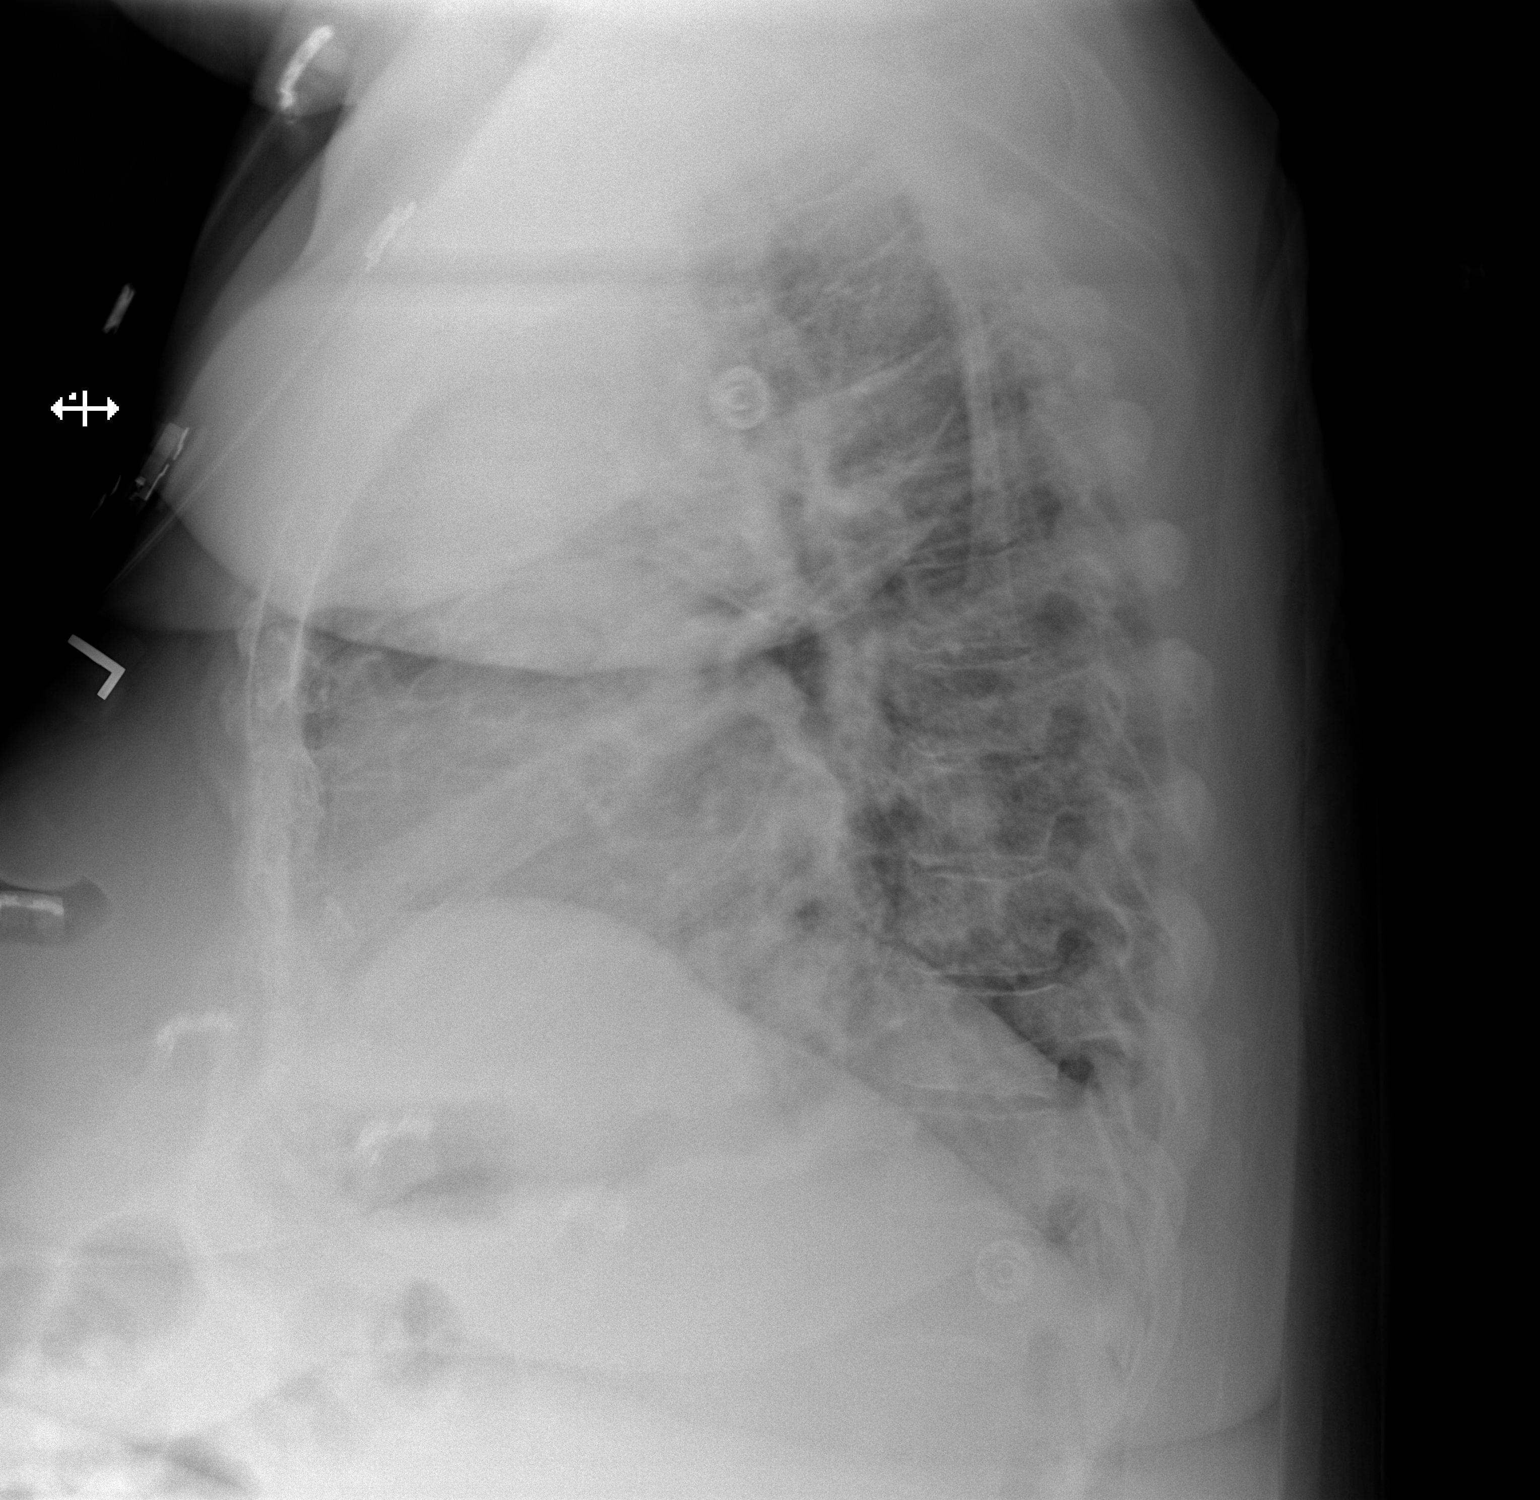
[im 2/2]
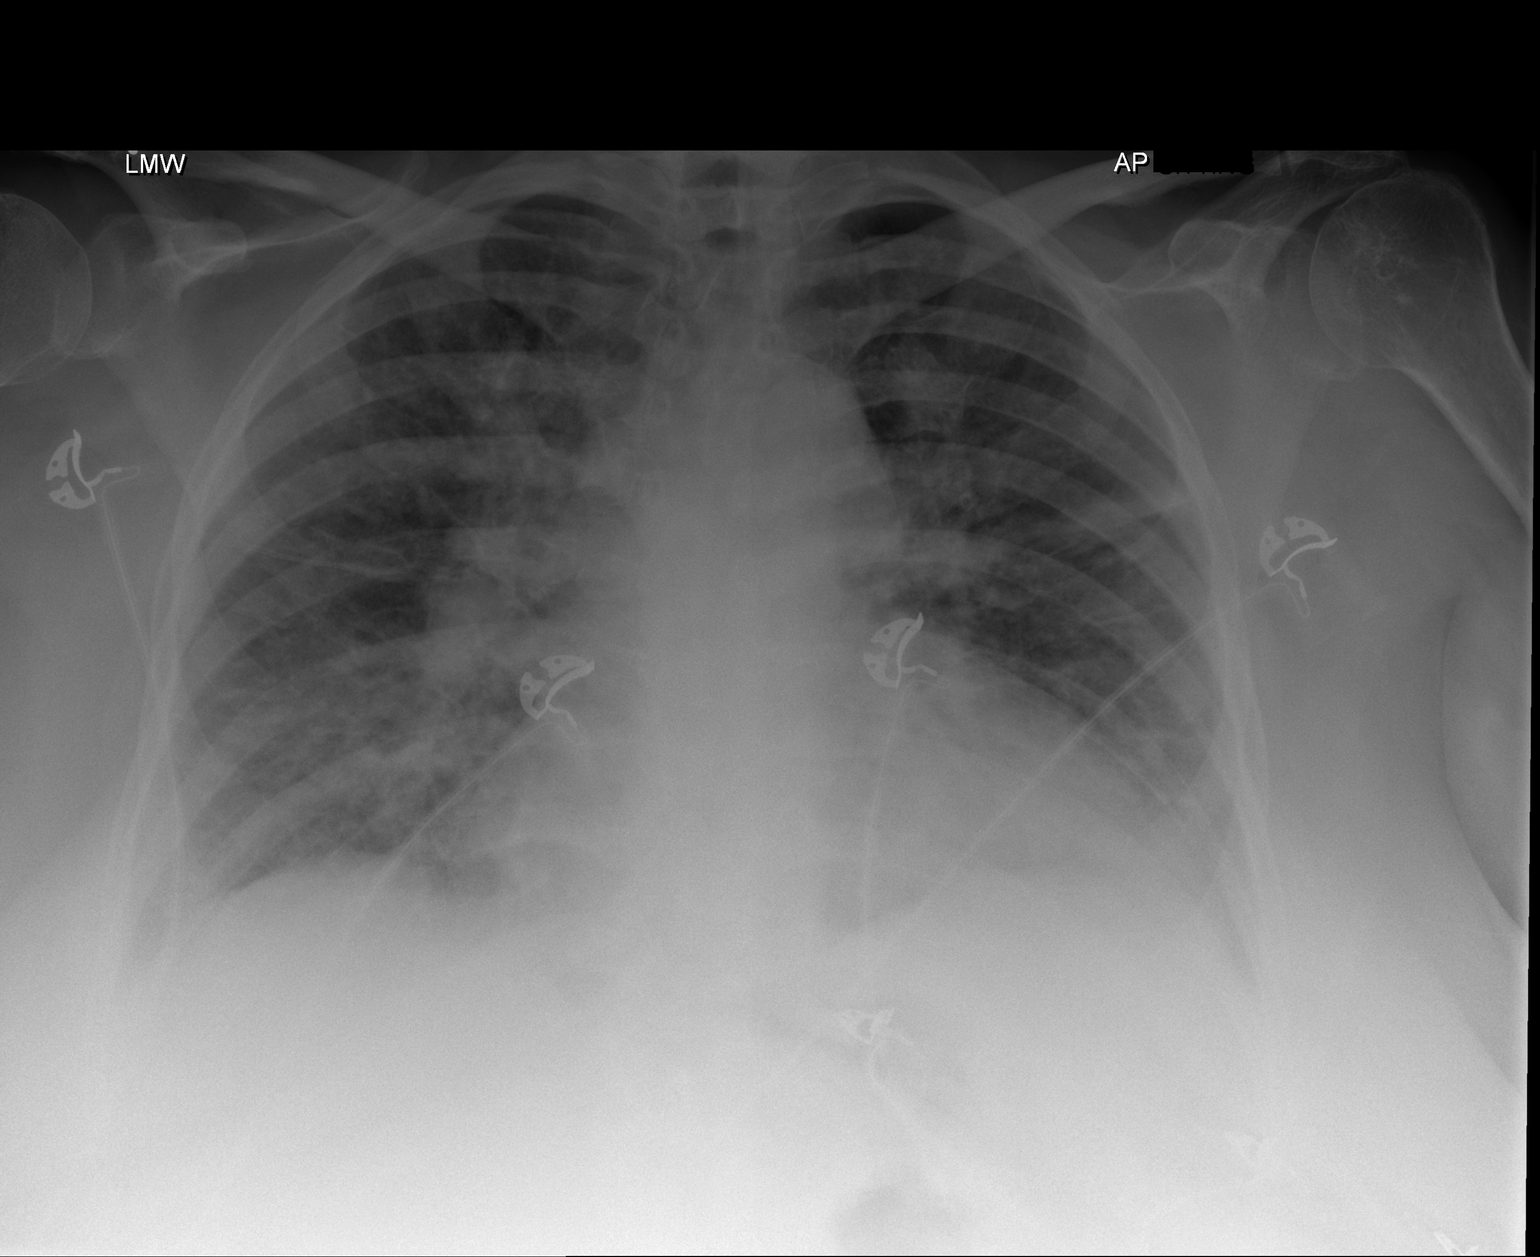

[2 of 2 positions shown; findings below may reference images not displayed]

FINDINGS: Low lung volumes. There is cardiomegaly with central vascular
congestion and mild perihilar edema. Subsegmental atelectasis in the
bilateral upper lobes. No large effusion. Cannot exclude mild
infiltrate at the left base.
IMPRESSION: 1. Cardiomegaly with central vascular congestion and mild perihilar
edema.
2. Discoid atelectasis in the upper lobes. Cannot exclude a mild
infiltrate at the left lung base

## 2019-01-20 ENCOUNTER — Ambulatory Visit: Payer: Medicare Other | Admitting: Urology

## 2019-03-10 ENCOUNTER — Ambulatory Visit: Payer: Medicare Other | Admitting: Urology

## 2020-01-31 ENCOUNTER — Other Ambulatory Visit: Payer: Self-pay

## 2020-01-31 ENCOUNTER — Emergency Department: Payer: Medicare Other

## 2020-01-31 ENCOUNTER — Emergency Department
Admission: EM | Admit: 2020-01-31 | Discharge: 2020-01-31 | Disposition: A | Discharge status: Home / Self Care | Payer: Medicare Other | Attending: Emergency Medicine | Admitting: Emergency Medicine

## 2020-01-31 DIAGNOSIS — Z5321 Procedure and treatment not carried out due to patient leaving prior to being seen by health care provider: Secondary | ICD-10-CM | POA: Diagnosis not present

## 2020-01-31 DIAGNOSIS — R350 Frequency of micturition: Secondary | ICD-10-CM | POA: Diagnosis not present

## 2020-01-31 DIAGNOSIS — R300 Dysuria: Secondary | ICD-10-CM | POA: Insufficient documentation

## 2020-01-31 DIAGNOSIS — R1110 Vomiting, unspecified: Secondary | ICD-10-CM | POA: Insufficient documentation

## 2020-01-31 DIAGNOSIS — R109 Unspecified abdominal pain: Secondary | ICD-10-CM | POA: Insufficient documentation

## 2020-01-31 LAB — BASIC METABOLIC PANEL
Anion gap: 13 (ref 5–15)
BUN: 45 mg/dL — ABNORMAL HIGH (ref 8–23)
CO2: 24 mmol/L (ref 22–32)
Calcium: 9 mg/dL (ref 8.9–10.3)
Chloride: 103 mmol/L (ref 98–111)
Creatinine, Ser: 1.61 mg/dL — ABNORMAL HIGH (ref 0.44–1.00)
GFR calc Af Amer: 38 mL/min — ABNORMAL LOW (ref >60)
GFR calc non Af Amer: 33 mL/min — ABNORMAL LOW (ref >60)
Glucose, Bld: 220 mg/dL — ABNORMAL HIGH (ref 70–99)
Potassium: 4.3 mmol/L (ref 3.5–5.1)
Sodium: 140 mmol/L (ref 135–145)

## 2020-01-31 LAB — CBC
HCT: 43.4 % (ref 36.0–46.0)
Hemoglobin: 14 g/dL (ref 12.0–15.0)
MCH: 28.7 pg (ref 26.0–34.0)
MCHC: 32.3 g/dL (ref 30.0–36.0)
MCV: 89.1 fL (ref 80.0–100.0)
Platelets: 167 10*3/uL (ref 150–400)
RBC: 4.87 MIL/uL (ref 3.87–5.11)
RDW: 12.6 % (ref 11.5–15.5)
WBC: 13.2 10*3/uL — ABNORMAL HIGH (ref 4.0–10.5)
nRBC: 0 % (ref 0.0–0.2)

## 2020-01-31 NOTE — ED Triage Notes (Signed)
Pt arrives [facility] from home for L flank pain since yesterday. VSS. 150/62, paced HR. Vomiting earlier today. C/o dysuria and frequency. No fever or diarrhea.

## 2020-02-03 ENCOUNTER — Telehealth: Payer: Self-pay | Admitting: Emergency Medicine

## 2020-02-03 NOTE — Telephone Encounter (Signed)
Called patient due to lwot to inquire about condition and follow up plans.  She says she has a call in to pcp, but has not heard back.  She says she had appt yesterday, but she felt so bad she canceled it.  I explained ct result and that she is at risk for loss of kidney function.  I told her she needs to be seen by physician and that she can call her pcp, but they may send her back here.  She verbalizes understanding.

## 2020-02-05 ENCOUNTER — Encounter: Payer: Self-pay | Admitting: Urology

## 2020-02-05 ENCOUNTER — Other Ambulatory Visit: Payer: Self-pay

## 2020-02-05 ENCOUNTER — Ambulatory Visit (INDEPENDENT_AMBULATORY_CARE_PROVIDER_SITE_OTHER): Payer: Medicare Other | Admitting: Urology

## 2020-02-05 VITALS — BP 125/81 | HR 69 | Temp 98.0°F | Ht 66.0 in | Wt 320.0 lb

## 2020-02-05 DIAGNOSIS — N390 Urinary tract infection, site not specified: Secondary | ICD-10-CM

## 2020-02-05 DIAGNOSIS — N200 Calculus of kidney: Secondary | ICD-10-CM

## 2020-02-05 MED ORDER — CIPROFLOXACIN HCL 500 MG PO TABS
500.0000 mg | ORAL_TABLET | Freq: Once | ORAL | Status: AC
  Administered 2020-02-05: 500 mg via ORAL

## 2020-02-05 MED ORDER — CIPROFLOXACIN HCL 500 MG PO TABS: 500.0000 mg | ORAL_TABLET | Freq: Two times a day (BID) | ORAL | 0 refills | Status: DC

## 2020-02-05 MED ORDER — TAMSULOSIN HCL 0.4 MG PO CAPS: 0.4000 mg | ORAL_CAPSULE | Freq: Every day | ORAL | 0 refills | Status: DC

## 2020-02-05 NOTE — Patient Instructions (Signed)
Take ABX as directed. If you have fever over 101 or severe left groin pain present to clinic or ER

## 2020-02-05 NOTE — Progress Notes (Signed)
02/05/20 12:47 PM   [name] 06-16-1951 [id number]  CC: Left 33mm distal ureteral stone   HPI: I saw [name] in urology clinic today for evaluation of a left 3 mm distal ureteral stone.  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  A CT was performed showing a 2 mm left distal ureteral stone with mild upstream hydronephrosis and some perinephric stranding, as well as some nonobstructing renal stones.  A urinalysis was never sent, and she left the ER prior to being evaluated by a provider.  She had mild leukocytosis to 13k and creatinine of 1.6 which is around her baseline.  She reports that she went home and started to feel better over the next day or two.  At no point did she have fevers or chills.  She has had some worsening incontinence over the last few months, as well as some foul-smelling urine, and occasional mild dysuria.  She does have a history of recurrent UTIs.  She reports she has not had any left-sided groin pain over the last 4 days.  She reports she feels significantly improved from the weekend when she presented to the ED.  The ER called her regarding the CT findings and encouraged her to be seen by urology.  In clinic today she is afebrile with normal vital signs.  Urinalysis today concerning for possible UTI with 11-30 WBCs, 11-30 RBCs, many bacteria, nitrite negative.   PMH: Past Medical History:  Diagnosis Date  . Bacteremia   . CAD (coronary artery disease)   . Diabetes mellitus without complication ([place])   . Diastolic CHF ([place])   . Hyperlipemia   . Hypertension   . Lymphedema   . MI (myocardial infarction) ([place])   . OSA (obstructive sleep apnea)   . Severe aortic stenosis     Surgical History: Past Surgical History:  Procedure Laterality Date  . CARDIAC SURGERY    . none    . PACEMAKER  INSERTION      Family History: Family History  Problem Relation Age of Onset  . Cancer Mother   . Heart failure Father     Social History:  reports that she has never smoked. She has never used smokeless tobacco. She reports that she does not drink alcohol and does not use drugs.  Physical Exam: BP 125/81   Pulse 69   Temp 98 F (36.7 C)   Ht 5\' 6"  (1.676 m)   Wt (!) 320 lb (145.2 kg)   BMI 51.65 kg/m    Constitutional:  Alert and oriented, No acute distress.  Well-appearing Cardiovascular: No clubbing, cyanosis, or edema. Respiratory: Normal respiratory effort, no increased work of breathing. GI: Abdomen is soft, nontender, nondistended, no abdominal masses GU: No CVA tenderness.  Laboratory Data: Reviewed, see HPI  Pertinent Imaging: I have personally reviewed the CT showing a 2 mm left distal ureteral stone with mild hydronephrosis and perinephric stranding, and nonobstructing renal stones.  Assessment & Plan:   In summary, she is a very comorbid 69 year old female who presented to the ED with left groin pain on 01/31/2020 and a CT showed a 2 mm left distal ureteral stone.  The ER had a significant weight and no urinalysis was sent, and she was never evaluated by provider and left before being seen.  She reports her left-sided groin pain has  resolved, and she overall feels well aside from some foul-smelling urine and mild dysuria.  Clinically she looks well today and denies any significant complaints, vital signs are normal, and she is afebrile.  We reviewed her CT at length showing a very small left distal ureteral stone, and I suspect this has likely passed as her pain is resolved over the last 4 to 5 days.  Her urinalysis is concerning for infection today, and I recommended a 7-day course of Cipro 500 mg twice daily, and urine was sent for culture.  We reviewed at length that typically when someone has a UTI and an obstructing stone patients can get very sick with sepsis  from urinary source/pyelonephritis, and an urgent stent placement is required.  I think with her feeling much better and her stone being so small and her pain resolved over the last week, we do not need to proceed to the OR for a stent placement or even repeat CT unless her clinical status were to change.  We discussed return precautions at length including fever over 101, or recurrence of left-sided flank or groin pain.  -Cipro 500 mg twice daily for suspected UTI, urine sent for culture, call with results -Flomax x14 days in case small stone still present -Close follow-up in 10 to 14 days with renal ultrasound to confirm resolution of hydronephrosis -Return precautions discussed at length  Nickolas Madrid, MD 02/05/2020  [facility] [address] [place], [place] [address] [phone]

## 2020-02-06 ENCOUNTER — Other Ambulatory Visit: Payer: Self-pay | Admitting: Urology

## 2020-02-06 ENCOUNTER — Telehealth: Payer: Self-pay | Admitting: *Deleted

## 2020-02-06 LAB — URINALYSIS, COMPLETE
Bilirubin, UA: NEGATIVE
Glucose, UA: NEGATIVE
Ketones, UA: NEGATIVE
Nitrite, UA: NEGATIVE
Protein,UA: NEGATIVE
Specific Gravity, UA: 1.015 (ref 1.005–1.030)
Urobilinogen, Ur: 1 mg/dL (ref 0.2–1.0)
pH, UA: 5.5 (ref 5.0–7.5)

## 2020-02-06 LAB — MICROSCOPIC EXAMINATION

## 2020-02-06 MED ORDER — CEFPODOXIME PROXETIL 100 MG PO TABS: 100.0000 mg | ORAL_TABLET | Freq: Two times a day (BID) | ORAL | 0 refills | Status: DC

## 2020-02-06 NOTE — Telephone Encounter (Signed)
Thanks, I sent Cefpodoxime twice daily to her [place], she can start that this morning, 7 days total, thanks  Nickolas Madrid, MD 02/06/2020

## 2020-02-06 NOTE — Telephone Encounter (Signed)
Patient called Triage line after taking one dose of Cipro yesterday she developed a rash all over her body and was itchy. Denies any other symptoms. Advised to discontinue and take Benadryl as needed for rash/itchiness. Please advise alternative?

## 2020-02-06 NOTE — Telephone Encounter (Signed)
Patient advised, voiced understanding.

## 2020-02-09 ENCOUNTER — Ambulatory Visit: Payer: Medicare Other | Admitting: Urology

## 2020-02-11 LAB — CULTURE, URINE COMPREHENSIVE

## 2020-02-18 ENCOUNTER — Ambulatory Visit
Admission: RE | Admit: 2020-02-18 | Discharge: 2020-02-18 | Disposition: A | Discharge status: Home / Self Care | Payer: Medicare Other | Source: Ambulatory Visit | Attending: Urology | Admitting: Urology

## 2020-02-18 ENCOUNTER — Ambulatory Visit: Payer: No Typology Code available for payment source

## 2020-02-18 ENCOUNTER — Other Ambulatory Visit: Payer: Self-pay

## 2020-02-18 DIAGNOSIS — N200 Calculus of kidney: Secondary | ICD-10-CM | POA: Diagnosis not present

## 2020-02-19 ENCOUNTER — Ambulatory Visit (INDEPENDENT_AMBULATORY_CARE_PROVIDER_SITE_OTHER): Payer: Medicare Other | Admitting: Urology

## 2020-02-19 ENCOUNTER — Encounter: Payer: Self-pay | Admitting: Urology

## 2020-02-19 VITALS — BP 140/75 | HR 69 | Ht 66.0 in | Wt 315.0 lb

## 2020-02-19 DIAGNOSIS — N200 Calculus of kidney: Secondary | ICD-10-CM

## 2020-02-19 DIAGNOSIS — N3281 Overactive bladder: Secondary | ICD-10-CM

## 2020-02-19 NOTE — Patient Instructions (Signed)
Dietary Guidelines to Help Prevent Kidney Stones Kidney stones are deposits of minerals and salts that form inside your kidneys. Your risk of developing kidney stones may be greater depending on your diet, your lifestyle, the medicines you take, and whether you have certain medical conditions. Most people can reduce their chances of developing kidney stones by following the instructions below. Depending on your overall health and the type of kidney stones you tend to develop, your dietitian may give you more specific instructions. What are tips for following this plan? Reading food labels  Choose foods with "no salt added" or "low-salt" labels. Limit your sodium intake to less than 1500 mg per day.  Choose foods with calcium for each meal and snack. Try to eat about 300 mg of calcium at each meal. Foods that contain 200-500 mg of calcium per serving include: ? 8 oz (237 ml) of milk, fortified nondairy milk, and fortified fruit juice. ? 8 oz (237 ml) of kefir, yogurt, and soy yogurt. ? 4 oz (118 ml) of tofu. ? 1 oz of cheese. ? 1 cup (300 g) of dried figs. ? 1 cup (91 g) of cooked broccoli. ? 1-3 oz can of sardines or mackerel.  Most people need 1000 to 1500 mg of calcium each day. Talk to your dietitian about how much calcium is recommended for you. Shopping  Buy plenty of fresh fruits and vegetables. Most people do not need to avoid fruits and vegetables, even if they contain nutrients that may contribute to kidney stones.  When shopping for convenience foods, choose: ? Whole pieces of fruit. ? Premade salads with dressing on the side. ? Low-fat fruit and yogurt smoothies.  Avoid buying frozen meals or prepared deli foods.  Look for foods with live cultures, such as yogurt and kefir. Cooking  Do not add salt to food when cooking. Place a salt shaker on the table and allow each person to add his or her own salt to taste.  Use vegetable protein, such as beans, textured vegetable  protein (TVP), or tofu instead of meat in pasta, casseroles, and soups. Meal planning   Eat less salt, if told by your dietitian. To do this: ? Avoid eating processed or premade food. ? Avoid eating fast food.  Eat less animal protein, including cheese, meat, poultry, or fish, if told by your dietitian. To do this: ? Limit the number of times you have meat, poultry, fish, or cheese each week. Eat a diet free of meat at least 2 days a week. ? Eat only one serving each day of meat, poultry, fish, or seafood. ? When you prepare animal protein, cut pieces into small portion sizes. For most meat and fish, one serving is about the size of one deck of cards.  Eat at least 5 servings of fresh fruits and vegetables each day. To do this: ? Keep fruits and vegetables on hand for snacks. ? Eat 1 piece of fruit or a handful of berries with breakfast. ? Have a salad and fruit at lunch. ? Have two kinds of vegetables at dinner.  Limit foods that are high in a substance called oxalate. These include: ? Spinach. ? Rhubarb. ? Beets. ? Potato chips and french fries. ? Nuts.  If you regularly take a diuretic medicine, make sure to eat at least 1-2 fruits or vegetables high in potassium each day. These include: ? Avocado. ? Banana. ? Orange, prune, carrot, or tomato juice. ? Baked potato. ? Cabbage. ? Beans and split   peas. General instructions   Drink enough fluid to keep your urine clear or pale yellow. This is the most important thing you can do.  Talk to your health care provider and dietitian about taking daily supplements. Depending on your health and the cause of your kidney stones, you may be advised: ? Not to take supplements with vitamin C. ? To take a calcium supplement. ? To take a daily probiotic supplement. ? To take other supplements such as magnesium, fish oil, or vitamin B6.  Take all medicines and supplements as told by your health care provider.  Limit alcohol intake to no  more than 1 drink a day for nonpregnant women and 2 drinks a day for men. One drink equals 12 oz of beer, 5 oz of wine, or 1 oz of hard liquor.  Lose weight if told by your health care provider. Work with your dietitian to find strategies and an eating plan that works best for you. What foods are not recommended? Limit your intake of the following foods, or as told by your dietitian. Talk to your dietitian about specific foods you should avoid based on the type of kidney stones and your overall health. Grains Breads. Bagels. Rolls. Baked goods. Salted crackers. Cereal. Pasta. Vegetables Spinach. Rhubarb. Beets. Canned vegetables. Pickles. Olives. Meats and other protein foods Nuts. Nut butters. Large portions of meat, poultry, or fish. Salted or cured meats. Deli meats. Hot dogs. Sausages. Dairy Cheese. Beverages Regular soft drinks. Regular vegetable juice. Seasonings and other foods Seasoning blends with salt. Salad dressings. Canned soups. Soy sauce. Ketchup. Barbecue sauce. Canned pasta sauce. Casseroles. Pizza. Lasagna. Frozen meals. Potato chips. French fries. Summary  You can reduce your risk of kidney stones by making changes to your diet.  The most important thing you can do is drink enough fluid. You should drink enough fluid to keep your urine clear or pale yellow.  Ask your health care provider or dietitian how much protein from animal sources you should eat each day, and also how much salt and calcium you should have each day. This information is not intended to replace advice given to you by your health care provider. Make sure you discuss any questions you have with your health care provider. Document Revised: 10/02/2018 Document Reviewed: 05/23/2016 Elsevier Patient Education  2020 [employer]  

## 2020-02-19 NOTE — Progress Notes (Signed)
   02/19/2020 1:13 PM   [name] [birth year] [id number]  Reason for visit: Follow up left distal ureteral stone, UTI, OAB  HPI: I saw [name] back in urology clinic for follow-up.  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  CT showed a 2 mm left distal ureteral stone and she left the ER prior to being evaluated by a provider for labs being sent.  Her left-sided pain resolved but when I saw her in clinic she was having UTI symptoms, and urinalysis was grossly infected and culture ultimately grew Proteus and Klebsiella.  She was treated with a 7-day course of culture appropriate Cefpodoxime.  She is here today for follow-up ultrasound to confirm passage of her stone and resolution of prior hydronephrosis.  I personally reviewed her renal ultrasound from 02/18/2020 that shows no residual hydronephrosis.  She denies any flank pain or other complaints today.  Her urinary symptoms have resolved.  She has baseline overactive bladder symptoms and urgency and frequency secondary to high-dose diuretics and limited mobility.  We discussed behavioral strategies at length regarding her OAB.  She is not a candidate for anticholinergics with her comorbidities.  We discussed general stone prevention strategies including adequate hydration with goal of producing 2.5 L of urine daily, increasing citric acid intake, increasing calcium intake during high oxalate meals, minimizing animal protein, and decreasing salt intake. Information about dietary recommendations given today.   Follow up as needed   [place], [place] [address] [place], [place] [address] [phone]

## 2020-02-20 ENCOUNTER — Telehealth: Payer: Self-pay | Admitting: Radiology

## 2020-02-20 NOTE — Telephone Encounter (Signed)
Per Dr Bernardo Heater, advised patient that any dose will help but a higher dose is best. Patient verbalized understanding.

## 2020-02-20 NOTE — Telephone Encounter (Signed)
Patient states Dr Diamantina Providence recommended taking cranberry tablets. She would like to know what dose because there are several at the drug store. Please return call to [phone].

## 2020-11-29 ENCOUNTER — Telehealth: Payer: Self-pay | Admitting: Family Medicine

## 2020-11-29 MED ORDER — CEFPODOXIME PROXETIL 100 MG PO TABS: 100.0000 mg | ORAL_TABLET | Freq: Two times a day (BID) | ORAL | 0 refills | Status: DC

## 2020-11-29 NOTE — Telephone Encounter (Signed)
Patient notified and voiced understanding. ABX sent to pharmacy.  

## 2020-11-29 NOTE — Telephone Encounter (Signed)
Patient left voicemail stating she has a UTI and you told her that if she ever has symptoms just to call and you will give her medication. She states she has a hard time getting someone to bring her to the office. You last gave her cefpodoxime (VANTIN) 14 0 refills on 02/06/20. Would you like her to make an appointment for this?

## 2020-11-29 NOTE — Telephone Encounter (Signed)
We can do a one time call in of the vantin 7 days BID(14 tabs) with 0 refills like last time. In the future thought she needs to give a UA and culture prior to abx prescription  Nickolas Madrid, MD 11/29/2020

## 2021-03-30 ENCOUNTER — Encounter: Payer: Medicare Other | Attending: Internal Medicine | Admitting: Internal Medicine

## 2021-03-30 ENCOUNTER — Other Ambulatory Visit: Payer: Self-pay

## 2021-03-30 DIAGNOSIS — E1140 Type 2 diabetes mellitus with diabetic neuropathy, unspecified: Secondary | ICD-10-CM | POA: Insufficient documentation

## 2021-03-30 DIAGNOSIS — I87332 Chronic venous hypertension (idiopathic) with ulcer and inflammation of left lower extremity: Secondary | ICD-10-CM | POA: Diagnosis not present

## 2021-03-30 DIAGNOSIS — I87331 Chronic venous hypertension (idiopathic) with ulcer and inflammation of right lower extremity: Secondary | ICD-10-CM | POA: Insufficient documentation

## 2021-03-30 DIAGNOSIS — M1990 Unspecified osteoarthritis, unspecified site: Secondary | ICD-10-CM | POA: Diagnosis not present

## 2021-03-30 DIAGNOSIS — I5032 Chronic diastolic (congestive) heart failure: Secondary | ICD-10-CM | POA: Insufficient documentation

## 2021-03-30 DIAGNOSIS — L03115 Cellulitis of right lower limb: Secondary | ICD-10-CM

## 2021-03-30 DIAGNOSIS — Z880 Allergy status to penicillin: Secondary | ICD-10-CM | POA: Diagnosis not present

## 2021-03-30 DIAGNOSIS — X58XXXD Exposure to other specified factors, subsequent encounter: Secondary | ICD-10-CM | POA: Insufficient documentation

## 2021-03-30 DIAGNOSIS — I10 Essential (primary) hypertension: Secondary | ICD-10-CM | POA: Insufficient documentation

## 2021-03-30 DIAGNOSIS — S91302A Unspecified open wound, left foot, initial encounter: Secondary | ICD-10-CM | POA: Diagnosis not present

## 2021-03-30 DIAGNOSIS — E11621 Type 2 diabetes mellitus with foot ulcer: Secondary | ICD-10-CM | POA: Diagnosis not present

## 2021-03-30 DIAGNOSIS — Z888 Allergy status to other drugs, medicaments and biological substances status: Secondary | ICD-10-CM | POA: Insufficient documentation

## 2021-03-30 DIAGNOSIS — Z881 Allergy status to other antibiotic agents status: Secondary | ICD-10-CM | POA: Diagnosis not present

## 2021-03-30 DIAGNOSIS — I110 Hypertensive heart disease with heart failure: Secondary | ICD-10-CM | POA: Diagnosis not present

## 2021-03-30 DIAGNOSIS — E1151 Type 2 diabetes mellitus with diabetic peripheral angiopathy without gangrene: Secondary | ICD-10-CM | POA: Insufficient documentation

## 2021-03-30 DIAGNOSIS — Z887 Allergy status to serum and vaccine status: Secondary | ICD-10-CM | POA: Diagnosis not present

## 2021-03-30 DIAGNOSIS — I4891 Unspecified atrial fibrillation: Secondary | ICD-10-CM | POA: Diagnosis not present

## 2021-03-30 DIAGNOSIS — S91302D Unspecified open wound, left foot, subsequent encounter: Secondary | ICD-10-CM | POA: Diagnosis not present

## 2021-03-31 NOTE — Progress Notes (Signed)
Miranda, [name] ([id number]) Visit Report for 03/30/2021 Abuse/Suicide Risk Screen Details Patient Name: [name], [name]. Date of Service: 03/30/2021 12:45 PM Medical Record Number: [id number] Patient Account Number: [id number] Date of Birth/Sex: 05-30-1951 (69 y.o. F) Treating RN: Dolan Amen Primary Care Lorelei Heikkila: Rutherford Guys Other Clinician: Referring Luwanda Starr: Referral, Self Treating Charnay Nazario/Extender: Yaakov Guthrie in Treatment: 0 Abuse/Suicide Risk Screen Items Answer ABUSE RISK SCREEN: Has anyone close to you tried to hurt or harm you recentlyo No Do you feel uncomfortable with anyone in your familyo No Has anyone forced you do things that you didnot want to doo No Electronic Signature(s) Signed: 03/31/2021 1:42:44 PM By: Dolan Amen RN Entered By: Dolan Amen on 03/30/2021 13:23:09 [name] ([id number]) -------------------------------------------------------------------------------- Activities of Daily Living Details Patient Name: [name]. Date of Service: 03/30/2021 12:45 PM Medical Record Number: [id number] Patient Account Number: [id number] Date of Birth/Sex: January 19, 1951 (69 y.o. F) Treating RN: Dolan Amen Primary Care Alamin Mccuiston: Rutherford Guys Other Clinician: Referring Fay Bagg: Referral, Self Treating Kristyana Notte/Extender: Yaakov Guthrie in Treatment: 0 Activities of Daily Living Items Answer Activities of Daily Living (Please select one for each item) Drive Automobile Not Able Take Medications Completely Able Use Telephone Completely Able Care for Appearance Need Assistance Use Toilet Completely Able Bath / Shower Need Assistance Dress Self Need Assistance Feed Self Completely Able Walk Need Assistance Get In / Out Bed Completely Able Housework Need Assistance Prepare Meals Not Able Handle Money Completely Able Shop for Self Completely Able Electronic Signature(s) Signed: 03/31/2021 1:42:44 PM By: Dolan Amen  RN Entered By: Dolan Amen on 03/30/2021 13:23:54 [name] ([id number]) -------------------------------------------------------------------------------- Education Screening Details Patient Name: [name]. Date of Service: 03/30/2021 12:45 PM Medical Record Number: [id number] Patient Account Number: [id number] Date of Birth/Sex: 1950-11-30 (69 y.o. F) Treating RN: Dolan Amen Primary Care Ishia Tenorio: Rutherford Guys Other Clinician: Referring Shuayb Schepers: Referral, Self Treating Haisley Arens/Extender: Yaakov Guthrie in Treatment: 0 Primary Learner Assessed: Patient Learning Preferences/Education Level/Primary Language Learning Preference: Explanation, Demonstration Highest Education Level: College or Above Preferred Language: English Cognitive Barrier Language Barrier: No Translator Needed: No Memory Deficit: No Emotional Barrier: No Cultural/Religious Beliefs Affecting Medical Care: No Physical Barrier Impaired Vision: No Impaired Hearing: No Decreased Hand dexterity: No Knowledge/Comprehension Knowledge Level: Medium Comprehension Level: Medium Ability to understand written instructions: Medium Ability to understand verbal instructions: Medium Motivation Anxiety Level: Calm Cooperation: Cooperative Education Importance: Acknowledges Need Interest in Health Problems: Asks Questions Perception: Coherent Willingness to Engage in Self-Management Medium Activities: Readiness to Engage in Self-Management Medium Activities: Electronic Signature(s) Signed: 03/31/2021 1:42:44 PM By: Dolan Amen RN Entered By: Dolan Amen on 03/30/2021 13:24:24 [name] ([id number]) -------------------------------------------------------------------------------- Fall Risk Assessment Details Patient Name: [name]. Date of Service: 03/30/2021 12:45 PM Medical Record Number: [id number] Patient Account Number: [id number] Date of Birth/Sex: 09/28/1950 (69 y.o.  F) Treating RN: Dolan Amen Primary Care Jaymie Mckiddy: Rutherford Guys Other Clinician: Referring Kaytlan Behrman: Referral, Self Treating Cranford Blessinger/Extender: Yaakov Guthrie in Treatment: 0 Fall Risk Assessment Items Have you had 2 or more falls in the last 12 monthso 0 No Have you had any fall that resulted in injury in the last 12 monthso 0 No FALLS RISK SCREEN History of falling - immediate or within 3 months 0 No Secondary diagnosis (Do you have 2 or more medical diagnoseso) 15 Yes Ambulatory aid None/bed rest/wheelchair/nurse 0 No Crutches/cane/walker 15 Yes Furniture 0 No Intravenous therapy Access/Saline/Heparin Lock 0 No Gait/Transferring Normal/ bed rest/ wheelchair 0 No Weak (short steps  with or without shuffle, stooped but able to lift head while walking, may 10 Yes seek support from furniture) Impaired (short steps with shuffle, may have difficulty arising from chair, head down, impaired 0 No balance) Mental Status Oriented to own ability 0 Yes Electronic Signature(s) Signed: 03/31/2021 1:42:44 PM By: Dolan Amen RN Entered By: Dolan Amen on 03/30/2021 13:24:53 [name] ([id number]) -------------------------------------------------------------------------------- Foot Assessment Details Patient Name: [name]. Date of Service: 03/30/2021 12:45 PM Medical Record Number: [id number] Patient Account Number: [id number] Date of Birth/Sex: 11/06/1950 (69 y.o. F) Treating RN: Dolan Amen Primary Care Kita Neace: Rutherford Guys Other Clinician: Referring Sanaz Scarlett: Referral, Self Treating Ferlando Lia/Extender: Yaakov Guthrie in Treatment: 0 Foot Assessment Items Site Locations + = Sensation present, - = Sensation absent, C = Callus, U = Ulcer R = Redness, W = Warmth, M = Maceration, PU = Pre-ulcerative lesion F = Fissure, S = Swelling, D = Dryness Assessment Right: Left: Other Deformity: No No Prior Foot Ulcer: No No Prior Amputation: No  No Charcot Joint: No No Ambulatory Status: Ambulatory With Help Assistance Device: Walker Gait: Administrator, arts) Signed: 03/31/2021 1:42:44 PM By: Dolan Amen RN Entered By: Dolan Amen on 03/30/2021 13:25:12 [name] ([id number]) -------------------------------------------------------------------------------- Nutrition Risk Screening Details Patient Name: [name]. Date of Service: 03/30/2021 12:45 PM Medical Record Number: [id number] Patient Account Number: [id number] Date of Birth/Sex: [birth year] (69 y.o. F) Treating RN: Dolan Amen Primary Care [place]: Rutherford Guys Other Clinician: Referring Kanetra Ho: Referral, Self Treating Aleicia Kenagy/Extender: Yaakov Guthrie in Treatment: 0 Height (in): 66 Weight (lbs): 332 Body Mass Index (BMI): 53.6 Nutrition Risk Screening Items Score Screening NUTRITION RISK SCREEN: I have an illness or condition that made me change the kind and/or amount of food I eat 0 No I eat fewer than two meals per day 0 No I eat few fruits and vegetables, or milk products 0 No I have three or more drinks of beer, liquor or wine almost every day 0 No I have tooth or mouth problems that make it hard for me to eat 0 No I don't always have enough money to buy the food I need 0 No I eat alone most of the time 0 No I take three or more different prescribed or over-the-counter drugs a day 1 Yes Without wanting to, I have lost or gained 10 pounds in the last six months 0 No I am not always physically able to shop, cook and/or feed myself 0 No Nutrition Protocols Good Risk Protocol 0 No interventions needed Moderate Risk Protocol High Risk Proctocol Risk Level: Good Risk Score: 1 Electronic Signature(s) Signed: 03/31/2021 1:42:44 PM By: Dolan Amen RN Entered By: Dolan Amen on 03/30/2021 13:25:01

## 2021-03-31 NOTE — Progress Notes (Signed)
RUTHELMA, BESCH ([id number]) Visit Report for 03/30/2021 Chief Complaint Document Details Patient Name: [name], [name]. Date of Service: 03/30/2021 12:45 PM Medical Record Number: [id number] Patient Account Number: [id number] Date of Birth/Sex: [birth year] (69 y.o. F) Treating RN: Dolan Amen Primary Care Provider: Rutherford Guys Other Clinician: Referring Provider: Referral, Self Treating Provider/Extender: Yaakov Guthrie in Treatment: 0 Information Obtained from: Patient Chief Complaint Bilateral lower extremity wounds Electronic Signature(s) Signed: 03/31/2021 1:47:04 PM By: Kalman Shan DO Entered By: Kalman Shan on 03/30/2021 14:06:44 [name] ([id number]) -------------------------------------------------------------------------------- Debridement Details Patient Name: [name]. Date of Service: 03/30/2021 12:45 PM Medical Record Number: [id number] Patient Account Number: [id number] Date of Birth/Sex: Dec 14, 1950 (69 y.o. F) Treating RN: Dolan Amen Primary Care Provider: Rutherford Guys Other Clinician: Referring Provider: Referral, Self Treating Provider/Extender: Yaakov Guthrie in Treatment: 0 Debridement Performed for Wound #2 Right Lower Leg Assessment: Performed By: Physician Kalman Shan, MD Debridement Type: Chemical/Enzymatic/Mechanical Agent Used: saline gauze Severity of Tissue Pre Debridement: Fat layer exposed Level of Consciousness (Pre- Awake and Alert procedure): Pre-procedure Verification/Time Out Yes - 13:59 Taken: Start Time: 13:59 Instrument: Other : gauze Bleeding: None Response to Treatment: Procedure was tolerated well Level of Consciousness (Post- Awake and Alert procedure): Post Debridement Measurements of Total Wound Length: (cm) 3 Width: (cm) 10.5 Depth: (cm) 0.2 Volume: (cm) 4.948 Character of Wound/Ulcer Post Debridement: Stable Severity of Tissue Post Debridement: Fat layer exposed Post  Procedure Diagnosis Same as Pre-procedure Electronic Signature(s) Signed: 03/31/2021 1:42:44 PM By: Dolan Amen RN Signed: 03/31/2021 1:47:04 PM By: Kalman Shan DO Entered By: Dolan Amen on 03/30/2021 14:00:28 [name] ([id number]) -------------------------------------------------------------------------------- Debridement Details Patient Name: [name]. Date of Service: 03/30/2021 12:45 PM Medical Record Number: [id number] Patient Account Number: [id number] Date of Birth/Sex: 1951/03/25 (69 y.o. F) Treating RN: Dolan Amen Primary Care Provider: Rutherford Guys Other Clinician: Referring Provider: Referral, Self Treating Provider/Extender: Yaakov Guthrie in Treatment: 0 Debridement Performed for Wound #4 Left Lower Leg Assessment: Performed By: Physician Kalman Shan, MD Debridement Type: Chemical/Enzymatic/Mechanical Agent Used: saline gauze Severity of Tissue Pre Debridement: Fat layer exposed Level of Consciousness (Pre- Awake and Alert procedure): Pre-procedure Verification/Time Out Yes - 13:59 Taken: Start Time: 13:59 Instrument: Other : gauze Bleeding: None Response to Treatment: Procedure was tolerated well Level of Consciousness (Post- Awake and Alert procedure): Post Debridement Measurements of Total Wound Length: (cm) 2 Width: (cm) 1.5 Depth: (cm) 0.2 Volume: (cm) 0.471 Character of Wound/Ulcer Post Debridement: Stable Severity of Tissue Post Debridement: Fat layer exposed Post Procedure Diagnosis Same as Pre-procedure Electronic Signature(s) Signed: 03/31/2021 1:42:44 PM By: Dolan Amen RN Signed: 03/31/2021 1:47:04 PM By: Kalman Shan DO Entered By: Dolan Amen on 03/30/2021 14:00:58 [name] ([id number]) -------------------------------------------------------------------------------- Debridement Details Patient Name: [name]. Date of Service: 03/30/2021 12:45 PM Medical Record Number:  [id number] Patient Account Number: [id number] Date of Birth/Sex: 30-Dec-1950 (69 y.o. F) Treating RN: Dolan Amen Primary Care Provider: Rutherford Guys Other Clinician: Referring Provider: Referral, Self Treating Provider/Extender: Yaakov Guthrie in Treatment: 0 Debridement Performed for Wound #3 Left Toe Great Assessment: Performed By: Physician Kalman Shan, MD Debridement Type: Chemical/Enzymatic/Mechanical Agent Used: Santyl Severity of Tissue Pre Debridement: Fat layer exposed Level of Consciousness (Pre- Awake and Alert procedure): Pre-procedure Verification/Time Out Yes - 14:00 Taken: Start Time: 14:00 Instrument: Other : cotton tipped applicator Bleeding: None Response to Treatment: Procedure was tolerated well Level of Consciousness (Post- Awake and Alert procedure): Post Debridement Measurements of Total Wound Length: (cm)  0.4 Width: (cm) 0.3 Depth: (cm) 0.1 Volume: (cm) 0.009 Character of Wound/Ulcer Post Debridement: Stable Severity of Tissue Post Debridement: Fat layer exposed Post Procedure Diagnosis Same as Pre-procedure Electronic Signature(s) Signed: 03/30/2021 2:40:31 PM By: Dolan Amen RN Signed: 03/31/2021 1:47:04 PM By: Kalman Shan DO Entered By: Dolan Amen on 03/30/2021 14:40:31 [name] ([id number]) -------------------------------------------------------------------------------- HPI Details Patient Name: [name]. Date of Service: 03/30/2021 12:45 PM Medical Record Number: [id number] Patient Account Number: [id number] Date of Birth/Sex: 1951-04-05 (69 y.o. F) Treating RN: Dolan Amen Primary Care Provider: Rutherford Guys Other Clinician: Referring Provider: Referral, Self Treating Provider/Extender: Yaakov Guthrie in Treatment: 0 History of Present Illness HPI Description: Admission 10/5 [name] is a 70 year old female with a past medical history of hypertension, venous insufficiency,  chronic diastolic heart failure and insulin-dependent type 2 diabetes that presents To the clinic for bilateral lower extremity wounds. She states that her right lower extremity developed a blister 1 week ago and she has been keeping the area covered with a bandaid. She states that when the Band-Aid came off it tore her skin. She subsequently developed pain, increased warmth and erythema to the right lower extremity. She denies purulent drainage. She has also developed an open wound to the left lower extremity that recently developed. She also has a small open wound to the proximal portion of the left great toe. This has been present since August and was evaluated by podiatry. It has not healed and she has not followed up with podiatry for this issue. Electronic Signature(s) Signed: 03/31/2021 1:47:04 PM By: Kalman Shan DO Entered By: Kalman Shan on 03/31/2021 13:46:44 [name] ([id number]) -------------------------------------------------------------------------------- Physical Exam Details Patient Name: [name]. Date of Service: 03/30/2021 12:45 PM Medical Record Number: [id number] Patient Account Number: [id number] Date of Birth/Sex: 16-Sep-1950 (69 y.o. F) Treating RN: Dolan Amen Primary Care Provider: Rutherford Guys Other Clinician: Referring Provider: Referral, Self Treating Provider/Extender: Yaakov Guthrie in Treatment: 0 Constitutional . Cardiovascular . Psychiatric . Notes Right lower extremity: Skin tear to the anterior aspect with Granulation tissue and nonviable tissue present. There is increased warmth and erythema to the lower leg. No purulent drainage. Left lower extremity: To the distal medial portion there is an open wound with granulation tissue present. Left foot: To the proximal portion of the left great toe there is a small area with dried fluid. Electronic Signature(s) Signed: 03/31/2021 1:47:04 PM By: Kalman Shan DO Entered  By: Kalman Shan on 03/30/2021 16:02:27 [name] ([id number]) -------------------------------------------------------------------------------- Physician Orders Details Patient Name: [name]. Date of Service: 03/30/2021 12:45 PM Medical Record Number: [id number] Patient Account Number: [id number] Date of Birth/Sex: January 08, 1951 (69 y.o. F) Treating RN: Dolan Amen Primary Care Provider: Rutherford Guys Other Clinician: Referring Provider: Referral, Self Treating Provider/Extender: Yaakov Guthrie in Treatment: 0 Verbal / Phone Orders: No Diagnosis Coding Follow-up Appointments o Return Appointment in 1 week. - Patient to remove wrap if unable to come o Nurse Visit as needed [place]: - [facility] o ADMIT to [facility] for wound care. May utilize formulary equivalent dressing for wound treatment orders unless otherwise specified. Home Health Nurse may visit [facility] to address patientos wound care needs. o Scheduled days for dressing changes to be completed; exception, patient has scheduled wound care visit that day. o **Please direct any NON-WOUND related issues/requests for orders to patient's Primary Care Physician. **If current dressing causes regression in wound condition, may D/C ordered dressing product/s  and apply Normal Saline Moist Dressing daily until next [facility] or Other MD appointment. **Notify [facility] of regression in wound condition at [phone]. Bathing/ Shower/ Hygiene o May shower with wound dressing protected with water repellent cover or cast protector. o No tub bath. Medications-Please add to medication list. o P.O. Antibiotics o Santyl Enzymatic Ointment - On left great toe Wound Treatment Wound #2 - Lower Leg Wound Laterality: Right Cleanser: Wound Cleanser 3 x Per Week/30 Days Discharge Instructions: Wash your hands with soap and water. Remove old dressing, discard into  plastic bag and place into trash. Cleanse the wound with Wound Cleanser prior to applying a clean dressing using gauze sponges, not tissues or cotton balls. Do not scrub or use excessive force. Pat dry using gauze sponges, not tissue or cotton balls. Primary Dressing: Hydrofera Blue Ready Transfer Foam, 2.5x2.5 (in/in) (DME) (Generic) 3 x Per Week/30 Days Discharge Instructions: Apply Hydrofera Blue Ready to wound bed as directed Secondary Dressing: ABD Pad 5x9 (in/in) (DME) (Generic) 3 x Per Week/30 Days Discharge Instructions: Cover with ABD pad Secondary Dressing: Kerlix 4.5 x 4.1 (in/yd) (DME) (Generic) 3 x Per Week/30 Days Discharge Instructions: Apply Kerlix to secure dressing Secured With: Coban Cohesive Bandage 4x5 (yds) Stretched (DME) (Generic) 3 x Per Week/30 Days Discharge Instructions: Apply coban to secure kerlix Compression Stockings: Circaid Juxta Lite Compression Wrap Right Leg Compression Amount: 20-30 mmHG Discharge Instructions: Apply Circaid Juxta Lite Compression Wrap as directed Wound #3 - Toe Great Wound Laterality: Left Topical: Santyl Collagenase Ointment, 30 (gm), tube 1 x Per Day/30 Days Discharge Instructions: Apply nickel thick to wound bed only Secondary Dressing: Coverlet Latex-Free Fabric Adhesive Dressings 1 x Per Day/30 Days Discharge Instructions: 1.5 x 2 Wound #4 - Lower Leg Wound Laterality: Left Cleanser: Wound Cleanser 3 x Per Week/30 Days [name], [name] ([id number]) Discharge Instructions: Wash your hands with soap and water. Remove old dressing, discard into plastic bag and place into trash. Cleanse the wound with Wound Cleanser prior to applying a clean dressing using gauze sponges, not tissues or cotton balls. Do not scrub or use excessive force. Pat dry using gauze sponges, not tissue or cotton balls. Primary Dressing: Hydrofera Blue Ready Transfer Foam, 2.5x2.5 (in/in) (DME) (Generic) 3 x Per Week/30 Days Discharge Instructions: Apply Hydrofera  Blue Ready to wound bed as directed Secondary Dressing: ABD Pad 5x9 (in/in) (DME) (Generic) 3 x Per Week/30 Days Discharge Instructions: Cover with ABD pad Secondary Dressing: Kerlix 4.5 x 4.1 (in/yd) (DME) (Generic) 3 x Per Week/30 Days Discharge Instructions: Apply Kerlix from base of toes to 3-finger-widths below knee bend Secured With: Coban Cohesive Bandage 4x5 (yds) Stretched (DME) (Generic) 3 x Per Week/30 Days Discharge Instructions: Apply coban from base of toes to 3-finger-widths below knee bend Compression Stockings: Circaid Juxta Lite Compression Wrap (DME) Left Leg Compression Amount: 20-30 mmHG Discharge Instructions: Apply Circaid Juxta Lite Compression Wrap as directed Electronic Signature(s) Signed: 03/31/2021 1:42:44 PM By: Dolan Amen RN Signed: 03/31/2021 1:47:04 PM By: Kalman Shan DO Previous Signature: 03/30/2021 2:20:43 PM Version By: Kalman Shan DO Entered By: Dolan Amen on 03/30/2021 14:32:58 [name] ([id number]) -------------------------------------------------------------------------------- Problem List Details Patient Name: [name]. Date of Service: 03/30/2021 12:45 PM Medical Record Number: [id number] Patient Account Number: [id number] Date of Birth/Sex: 10-17-1950 (69 y.o. F) Treating RN: Dolan Amen Primary Care Provider: Rutherford Guys Other Clinician: Referring Provider: Referral, Self Treating Provider/Extender: Yaakov Guthrie in Treatment: 0 Active Problems ICD-10 Encounter Code Description  Active Date MDM Diagnosis I87.331 Chronic venous hypertension (idiopathic) with ulcer and inflammation of 03/30/2021 No Yes right lower extremity I87.332 Chronic venous hypertension (idiopathic) with ulcer and inflammation of 03/30/2021 No Yes left lower extremity S91.302A Unspecified open wound, left foot, initial encounter 03/30/2021 No Yes Inactive Problems Resolved Problems Electronic Signature(s) Signed: 03/31/2021  1:47:04 PM By: Kalman Shan DO Entered By: Kalman Shan on 03/30/2021 14:05:48 [name] ([id number]) -------------------------------------------------------------------------------- Progress Note Details Patient Name: [name]. Date of Service: 03/30/2021 12:45 PM Medical Record Number: [id number] Patient Account Number: [id number] Date of Birth/Sex: 1951-05-08 (69 y.o. F) Treating RN: Dolan Amen Primary Care Provider: Rutherford Guys Other Clinician: Referring Provider: Referral, Self Treating Provider/Extender: Yaakov Guthrie in Treatment: 0 Subjective Chief Complaint Information obtained from Patient Bilateral lower extremity wounds History of Present Illness (HPI) The following HPI elements were documented for the patient's wound: Location: left leg on the lower lateral side Quality: Patient reports No Pain. Severity: Patient states wound (s) are getting better. Duration: Patient has had the wound for > 3 months prior to seeking treatment at the wound center Modifying Factors: diabetes,obesity Associated Signs and Symptoms: Very dry skin legs Admission 10/5 Ms. Ayomide Stanback is a 70 year old female with a past medical history of hypertension, venous insufficiency, chronic diastolic heart failure and insulin-dependent type 2 diabetes that presents To the clinic for bilateral lower extremity wounds. She states that her right lower extremity developed a blister 1 week ago and she has been keeping the area covered with a bandaid. She states that when the Band-Aid came off it tore her skin. She subsequently developed pain, increased warmth and erythema to the right lower extremity. She denies purulent drainage. She has also developed an open wound to the left lower extremity that recently developed. She also has a small open wound to the proximal portion of the left great toe. This has been present since August and was evaluated by podiatry. It has not  healed and she has not followed up with podiatry for this issue. Patient History Information obtained from Patient. Allergies Niaspan Extended-Release (Severity: Severe, Reaction: anaphylaxis), tetanus toxoid, adsorbed (Severity: Severe), Bactrim (Severity: Moderate), Januvia (Severity: Moderate), lisinopril (Severity: Moderate), Cipro Family History Cancer - Maternal Grandparents,Paternal Grandparents,Mother,Father, Diabetes - Maternal Grandparents,Paternal Grandparents,Mother,Siblings, Heart Disease - Father,Siblings, Hypertension - Siblings,Father,Mother, No family history of Hereditary Spherocytosis, Kidney Disease, Lung Disease, Seizures, Stroke, Thyroid Problems, Tuberculosis. Social History Never smoker, Marital Status - Single, Alcohol Use - Never, Drug Use - No History, Caffeine Use - Never. Medical History Eyes Patient has history of Cataracts Denies history of Glaucoma, Optic Neuritis Ear/Nose/Mouth/Throat Denies history of Chronic sinus problems/congestion, Middle ear problems Hematologic/Lymphatic Denies history of Anemia, Hemophilia, Human Immunodeficiency Virus, Lymphedema, Sickle Cell Disease Respiratory Denies history of Aspiration, Asthma, Chronic Obstructive Pulmonary Disease (COPD), Pneumothorax, Sleep Apnea, Tuberculosis Cardiovascular Patient has history of Arrhythmia - Afib, Hypertension Denies history of Angina, Congestive Heart Failure, Coronary Artery Disease, Deep Vein Thrombosis, Hypotension, Myocardial Infarction, Peripheral Arterial Disease, Peripheral Venous Disease, Phlebitis, Vasculitis Gastrointestinal Denies history of Cirrhosis , Colitis, Crohn s, Hepatitis A, Hepatitis B, Hepatitis C Endocrine Patient has history of Type II Diabetes Denies history of Type I Diabetes Genitourinary Denies history of End Stage Renal Disease Immunological Denies history of Lupus Erythematosus, Raynaud s, Scleroderma Integumentary (Skin) Denies history of History  of Burn, History of pressure wounds Musculoskeletal [name], [name] ([id number]) Patient has history of Osteoarthritis Denies history of Gout, Rheumatoid Arthritis, Osteomyelitis Neurologic Patient has history of Neuropathy -  fingers and feet Denies history of Dementia, Quadriplegia, Paraplegia, Seizure Disorder Oncologic Denies history of Received Chemotherapy, Received Radiation Psychiatric Denies history of Anorexia/bulimia, Confinement Anxiety Patient is treated with Insulin, Oral Agents. Blood sugar is tested. Medical And Surgical History Notes Cardiovascular "heart valve problem" Review of Systems (ROS) Constitutional Symptoms (General Health) Denies complaints or symptoms of Fatigue, Fever, Chills, Marked Weight Change. Eyes Denies complaints or symptoms of Dry Eyes, Vision Changes, Glasses / Contacts. Ear/Nose/Mouth/Throat Denies complaints or symptoms of Difficult clearing ears, Sinusitis, tinnitis Hematologic/Lymphatic Denies complaints or symptoms of Bleeding / Clotting Disorders, Human Immunodeficiency Virus. Respiratory Complains or has symptoms of Shortness of Breath. Denies complaints or symptoms of Chronic or frequent coughs. Cardiovascular Complains or has symptoms of LE edema. Denies complaints or symptoms of Chest pain. Gastrointestinal Denies complaints or symptoms of Frequent diarrhea, Nausea, Vomiting. Endocrine Denies complaints or symptoms of Hepatitis, Thyroid disease, Polydypsia (Excessive Thirst). Genitourinary Complains or has symptoms of Kidney failure/ Dialysis - Stage 3, Incontinence/dribbling. Immunological Denies complaints or symptoms of Hives, Itching. Integumentary (Skin) Complains or has symptoms of Wounds, Swelling. Musculoskeletal Complains or has symptoms of Muscle Weakness. Neurologic Denies complaints or symptoms of Numbness/parasthesias, Focal/Weakness. Psychiatric Denies complaints or symptoms of Anxiety,  Claustrophobia. Objective Constitutional Vitals Time Taken: 1:14 PM, Height: 66 in, Source: Stated, Weight: 332 lbs, Source: Stated, BMI: 53.6, Temperature: 99.0 F, Pulse: 92 bpm, Respiratory Rate: 20 breaths/min, Blood Pressure: 143/85 mmHg. General Notes: unable to get weight in office d/t pt being unsteady General Notes: Right lower extremity: Skin tear to the anterior aspect with Granulation tissue and nonviable tissue present. There is increased warmth and erythema to the lower leg. No purulent drainage. Left lower extremity: To the distal medial portion there is an open wound with granulation tissue present. Left foot: To the proximal portion of the left great toe there is a small area with dried fluid. Integumentary (Hair, Skin) Wound #2 status is Open. Original cause of wound was Blister. The date acquired was: 03/23/2021. The wound is located on the Right Lower Leg. The wound measures 3cm length x 10.5cm width x 0.2cm depth; 24.74cm^2 area and 4.948cm^3 volume. There is Fat Layer (Subcutaneous Tissue) exposed. There is no tunneling or undermining noted. There is a large amount of serous drainage noted. There is large (67-100%) red granulation within the wound bed. There is a small (1-33%) amount of necrotic tissue within the wound bed including Adherent Slough. Wound #3 status is Open. Original cause of wound was Gradually Appeared. The date acquired was: 02/09/2021. The wound is located on the Left Toe Great. The wound measures 0.4cm length x 0.3cm width x 0.1cm depth; 0.094cm^2 area and 0.009cm^3 volume. There is Fat Layer (Subcutaneous Tissue) exposed. There is no tunneling or undermining noted. There is a medium amount of serosanguineous drainage noted. [name], [name] F. ([id number]) There is medium (34-66%) red granulation within the wound bed. There is a medium (34-66%) amount of necrotic tissue within the wound bed including Adherent Slough. Wound #4 status is Open. Original cause  of wound was Blister. The date acquired was: 03/30/2021. The wound is located on the Left Lower Leg. The wound measures 2cm length x 1.5cm width x 0.2cm depth; 2.356cm^2 area and 0.471cm^3 volume. There is Fat Layer (Subcutaneous Tissue) exposed. There is no tunneling or undermining noted. There is a medium amount of serous drainage noted. There is medium (34-66%) red granulation within the wound bed. There is a medium (34-66%) amount of necrotic tissue within the wound bed  including [place]. Assessment Active Problems ICD-10 Chronic venous hypertension (idiopathic) with ulcer and inflammation of right lower extremity Chronic venous hypertension (idiopathic) with ulcer and inflammation of left lower extremity Unspecified open wound, left foot, initial encounter Patient presents with cellulitis to her right lower extremity. I will start her on Augmentin for this issue. I also recommended Hydrofera Blue to the wound bed. The wound started out as a blister and patient has a history of venous ulcers. When cellulitis is resolved she would benefit from compression wrap to heal the wound. We will order juxta lite compressions. She has an open wound to the left lower with granulation tissue present. Also venous related. No signs of infection on exam. I recommended using Hydrofera Blue under compression therapy to this area as well. Her left great toe wound has dried fluid to the previous wound site. We will place Santyl and Hydrofera Blue over this. Patient states she has a hard time doing her own wound care. We will order home health for her. Procedures Wound #2 Pre-procedure diagnosis of Wound #2 is a Venous Leg Ulcer located on the Right Lower Leg .Severity of Tissue Pre Debridement is: Fat layer exposed. There was a Chemical/Enzymatic/Mechanical debridement performed by Kalman Shan, MD. With the following instrument(s): gauze. Other agent used was saline gauze. A time out was conducted at  13:59, prior to the start of the procedure. There was no bleeding. The procedure was tolerated well. Post Debridement Measurements: 3cm length x 10.5cm width x 0.2cm depth; 4.948cm^3 volume. Character of Wound/Ulcer Post Debridement is stable. Severity of Tissue Post Debridement is: Fat layer exposed. Post procedure Diagnosis Wound #2: Same as Pre-Procedure Wound #3 Pre-procedure diagnosis of Wound #3 is a Diabetic Wound/Ulcer of the Lower Extremity located on the Left Toe Great .Severity of Tissue Pre Debridement is: Fat layer exposed. There was a Chemical/Enzymatic/Mechanical debridement performed by Kalman Shan, MD. With the following instrument(s): cotton tipped applicator. Agent used was [employer]. A time out was conducted at 14:00, prior to the start of the procedure. There was no bleeding. The procedure was tolerated well. Post Debridement Measurements: 0.4cm length x 0.3cm width x 0.1cm depth; 0.009cm^3 volume. Character of Wound/Ulcer Post Debridement is stable. Severity of Tissue Post Debridement is: Fat layer exposed. Post procedure Diagnosis Wound #3: Same as Pre-Procedure Wound #4 Pre-procedure diagnosis of Wound #4 is a Venous Leg Ulcer located on the Left Lower Leg .Severity of Tissue Pre Debridement is: Fat layer exposed. There was a Chemical/Enzymatic/Mechanical debridement performed by Kalman Shan, MD. With the following instrument(s): gauze. Other agent used was saline gauze. A time out was conducted at 13:59, prior to the start of the procedure. There was no bleeding. The procedure was tolerated well. Post Debridement Measurements: 2cm length x 1.5cm width x 0.2cm depth; 0.471cm^3 volume. Character of Wound/Ulcer Post Debridement is stable. Severity of Tissue Post Debridement is: Fat layer exposed. Post procedure Diagnosis Wound #4: Same as Pre-Procedure Plan Follow-up Appointments: Return Appointment in 1 week. - Patient to remove wrap if unable to come Nurse Visit  as needed Home Health: [place]: - [facility] ADMIT to [facility] for wound care. May utilize formulary equivalent dressing for wound treatment orders unless otherwise specified. Home [name], [name] ([id number]) Health Nurse may visit [facility] to address patient s wound care needs. Scheduled days for dressing changes to be completed; exception, patient has scheduled wound care visit that day. **Please direct any NON-WOUND related issues/requests for orders to patient's Primary Care Physician. **  If current dressing causes regression in wound condition, may D/C ordered dressing product/s and apply Normal Saline Moist Dressing daily until next [facility] or Other MD appointment. **Notify [facility] of regression in wound condition at [phone]. Bathing/ Shower/ Hygiene: May shower with wound dressing protected with water repellent cover or cast protector. No tub bath. Medications-Please add to medication list.: P.O. Antibiotics Santyl Enzymatic Ointment - On left great toe WOUND #2: - Lower Leg Wound Laterality: Right Cleanser: Wound Cleanser 3 x Per Week/30 Days Discharge Instructions: Wash your hands with soap and water. Remove old dressing, discard into plastic bag and place into trash. Cleanse the wound with Wound Cleanser prior to applying a clean dressing using gauze sponges, not tissues or cotton balls. Do not scrub or use excessive force. Pat dry using gauze sponges, not tissue or cotton balls. Primary Dressing: Hydrofera Blue Ready Transfer Foam, 2.5x2.5 (in/in) (DME) (Generic) 3 x Per Week/30 Days Discharge Instructions: Apply Hydrofera Blue Ready to wound bed as directed Secondary Dressing: ABD Pad 5x9 (in/in) (DME) (Generic) 3 x Per Week/30 Days Discharge Instructions: Cover with ABD pad Secondary Dressing: Kerlix 4.5 x 4.1 (in/yd) (DME) (Generic) 3 x Per Week/30 Days Discharge Instructions: Apply Kerlix to secure dressing Secured With: Coban Cohesive  Bandage 4x5 (yds) Stretched (DME) (Generic) 3 x Per Week/30 Days Discharge Instructions: Apply coban to secure kerlix Compression Stockings: Circaid Juxta Lite Compression Wrap Compression Amount: 20-30 mmHg (right) Discharge Instructions: Apply Circaid Juxta Lite Compression Wrap as directed WOUND #3: - Toe Great Wound Laterality: Left Topical: Santyl Collagenase Ointment, 30 (gm), tube 1 x Per Day/30 Days Discharge Instructions: Apply nickel thick to wound bed only Secondary Dressing: Coverlet Latex-Free Fabric Adhesive Dressings 1 x Per Day/30 Days Discharge Instructions: 1.5 x 2 WOUND #4: - Lower Leg Wound Laterality: Left Cleanser: Wound Cleanser 3 x Per Week/30 Days Discharge Instructions: Wash your hands with soap and water. Remove old dressing, discard into plastic bag and place into trash. Cleanse the wound with Wound Cleanser prior to applying a clean dressing using gauze sponges, not tissues or cotton balls. Do not scrub or use excessive force. Pat dry using gauze sponges, not tissue or cotton balls. Primary Dressing: Hydrofera Blue Ready Transfer Foam, 2.5x2.5 (in/in) (DME) (Generic) 3 x Per Week/30 Days Discharge Instructions: Apply Hydrofera Blue Ready to wound bed as directed Secondary Dressing: ABD Pad 5x9 (in/in) (DME) (Generic) 3 x Per Week/30 Days Discharge Instructions: Cover with ABD pad Secondary Dressing: Kerlix 4.5 x 4.1 (in/yd) (DME) (Generic) 3 x Per Week/30 Days Discharge Instructions: Apply Kerlix from base of toes to 3-finger-widths below knee bend Secured With: Coban Cohesive Bandage 4x5 (yds) Stretched (DME) (Generic) 3 x Per Week/30 Days Discharge Instructions: Apply coban from base of toes to 3-finger-widths below knee bend Compression Stockings: Circaid Juxta Lite Compression Wrap (DME) Compression Amount: 20-30 mmHg (left) Discharge Instructions: Apply Circaid Juxta Lite Compression Wrap as directed 1. Augmentin 2. Hydrofera Blue 3. Compression  wrap 4. Juxta light compressions ordered 5. Santyl to toe in office 6. Follow-up in 1 week Electronic Signature(s) Signed: 03/31/2021 1:47:04 PM By: Kalman Shan DO Entered By: Kalman Shan on 03/30/2021 16:08:52 [name] ([id number]) -------------------------------------------------------------------------------- ROS/PFSH Details Patient Name: [name]. Date of Service: 03/30/2021 12:45 PM Medical Record Number: [id number] Patient Account Number: [id number] Date of Birth/Sex: April 03, 1951 (69 y.o. F) Treating RN: Dolan Amen Primary Care Provider: Rutherford Guys Other Clinician: Referring Provider: Referral, Self Treating Provider/Extender: Yaakov Guthrie in Treatment: 0  Information Obtained From Patient Constitutional Symptoms (General Health) Complaints and Symptoms: Negative for: Fatigue; Fever; Chills; Marked Weight Change Eyes Complaints and Symptoms: Negative for: Dry Eyes; Vision Changes; Glasses / Contacts Medical History: Positive for: Cataracts Negative for: Glaucoma; Optic Neuritis Ear/Nose/Mouth/Throat Complaints and Symptoms: Negative for: Difficult clearing ears; Sinusitis Review of System Notes: tinnitis Medical History: Negative for: Chronic sinus problems/congestion; Middle ear problems Hematologic/Lymphatic Complaints and Symptoms: Negative for: Bleeding / Clotting Disorders; Human Immunodeficiency Virus Medical History: Negative for: Anemia; Hemophilia; Human Immunodeficiency Virus; Lymphedema; Sickle Cell Disease Respiratory Complaints and Symptoms: Positive for: Shortness of Breath Negative for: Chronic or frequent coughs Medical History: Negative for: Aspiration; Asthma; Chronic Obstructive Pulmonary Disease (COPD); Pneumothorax; Sleep Apnea; Tuberculosis Cardiovascular Complaints and Symptoms: Positive for: LE edema Negative for: Chest pain Medical History: Positive for: Arrhythmia - Afib; Hypertension Negative  for: Angina; Congestive Heart Failure; Coronary Artery Disease; Deep Vein Thrombosis; Hypotension; Myocardial Infarction; Peripheral Arterial Disease; Peripheral Venous Disease; Phlebitis; Vasculitis Past Medical History Notes: "heart valve problem" Gastrointestinal Complaints and Symptoms: Negative for: Frequent diarrhea; Nausea; Vomiting [name], [name]. ([id number]) Medical History: Negative for: Cirrhosis ; Colitis; Crohnos; Hepatitis A; Hepatitis B; Hepatitis C Endocrine Complaints and Symptoms: Negative for: Hepatitis; Thyroid disease; Polydypsia (Excessive Thirst) Medical History: Positive for: Type II Diabetes Negative for: Type I Diabetes Time with diabetes: since 2007 Treated with: Insulin, Oral agents Blood sugar tested every day: Yes Tested : daily Genitourinary Complaints and Symptoms: Positive for: Kidney failure/ Dialysis - Stage 3; Incontinence/dribbling Medical History: Negative for: End Stage Renal Disease Immunological Complaints and Symptoms: Negative for: Hives; Itching Medical History: Negative for: Lupus Erythematosus; Raynaudos; Scleroderma Integumentary (Skin) Complaints and Symptoms: Positive for: Wounds; Swelling Medical History: Negative for: History of Burn; History of pressure wounds Musculoskeletal Complaints and Symptoms: Positive for: Muscle Weakness Medical History: Positive for: Osteoarthritis Negative for: Gout; Rheumatoid Arthritis; Osteomyelitis Neurologic Complaints and Symptoms: Negative for: Numbness/parasthesias; Focal/Weakness Medical History: Positive for: Neuropathy - fingers and feet Negative for: Dementia; Quadriplegia; Paraplegia; Seizure Disorder Psychiatric Complaints and Symptoms: Negative for: Anxiety; Claustrophobia Medical History: Negative for: Anorexia/bulimia; Confinement Anxiety Oncologic Medical History: Negative for: Received Chemotherapy; Received Radiation [name], [name] ([id number]) HBO Extended  History Items Eyes: Cataracts Immunizations Pneumococcal Vaccine: Received Pneumococcal Vaccination: Yes Received Pneumococcal Vaccination On or After 60th Birthday: No Implantable Devices Yes Family and Social History Cancer: Yes - Maternal Grandparents,Paternal Grandparents,Mother,Father; Diabetes: Yes - Maternal Grandparents,Paternal Grandparents,Mother,Siblings; Heart Disease: Yes - Father,Siblings; Hereditary Spherocytosis: No; Hypertension: Yes - Siblings,Father,Mother; Kidney Disease: No; Lung Disease: No; Seizures: No; Stroke: No; Thyroid Problems: No; Tuberculosis: No; Never smoker; Marital Status - Single; Alcohol Use: Never; Drug Use: No History; Caffeine Use: Never; Financial Concerns: Yes; Food, Clothing or Shelter Needs: No; Support System Lacking: No; Transportation Concerns: No Electronic Signature(s) Signed: 03/31/2021 1:42:44 PM By: Dolan Amen RN Signed: 03/31/2021 1:47:04 PM By: Kalman Shan DO Entered By: Dolan Amen on 03/30/2021 13:23:04 [name] ([id number]) -------------------------------------------------------------------------------- SuperBill Details Patient Name: [name]. Date of Service: 03/30/2021 Medical Record Number: [id number] Patient Account Number: [id number] Date of Birth/Sex: 1951/03/16 (69 y.o. 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 Chronic venous hypertension (idiopathic) with ulcer and inflammation of right lower extremity I87.332 Chronic venous hypertension (idiopathic) with ulcer and inflammation of left lower extremity S91.302A Unspecified open wound, left foot, initial encounter L03.115 Cellulitis of right lower limb Facility  Procedures CPT4 Code: [id number] Description: [place] VISIT-LEV 3 EST  PT Modifier: Quantity: 1 CPT4 Code: [id number] Description: [id number] - DEBRIDE W/O ANES NON SELECT Modifier: Quantity: 1 Physician Procedures CPT4 Code Description: [id number] - WC PHYS LEVEL 4 - NEW PT Modifier: Quantity: 1 CPT4 Code Description: ICD-10 Diagnosis Description L03.115 Cellulitis of right lower limb I87.331 Chronic venous hypertension (idiopathic) with ulcer and inflammation of I87.332 Chronic venous hypertension (idiopathic) with ulcer and inflammation of  S91.302A Unspecified open wound, left foot, initial encounter Modifier: right lower extremit left lower extremity Quantity: y Engineer, maintenance) Signed: 03/31/2021 1:47:04 PM By: Kalman Shan DO Previous Signature: 03/30/2021 2:41:51 PM Version By: Dolan Amen RN Entered By: Kalman Shan on 03/30/2021 16:09:42

## 2021-03-31 NOTE — Progress Notes (Signed)
[name], [name] ([id number]) Visit Report for 03/30/2021 Allergy List Details Patient Name: [name], [name]. Date of Service: 03/30/2021 12:45 PM Medical Record Number: [id number] Patient Account Number: [id number] Date of Birth/Sex: 1950/07/13 (69 y.o. [name]) Treating RN: Dolan Amen Primary Care Jovani Flury: Rutherford Guys Other Clinician: Referring Zeppelin Commisso: Referral, Self Treating Caoimhe Damron/Extender: Yaakov Guthrie in Treatment: 0 Allergies Active Allergies Niaspan Extended-Release Reaction: anaphylaxis Severity: Severe tetanus toxoid, adsorbed Severity: Severe Bactrim Severity: Moderate Januvia Severity: Moderate lisinopril Severity: Moderate Cipro Allergy Notes Electronic Signature(s) Signed: 03/31/2021 1:42:44 PM By: Dolan Amen RN Entered By: Dolan Amen on 03/30/2021 13:51:14 [name] ([id number]) -------------------------------------------------------------------------------- Arrival Information Details Patient Name: [name]. Date of Service: 03/30/2021 12:45 PM Medical Record Number: [id number] Patient Account Number: [id number] Date of Birth/Sex: August 31, 1950 (69 y.o. [name]) Treating RN: Dolan Amen Primary Care Rajeev Escue: Rutherford Guys Other Clinician: Referring Seleny Allbright: Referral, Self Treating Anira Senegal/Extender: Yaakov Guthrie in Treatment: 0 Visit Information Patient Arrived: Walker Arrival Time: 13:12 Accompanied By: friend Transfer Assistance: None Patient Identification Verified: Yes Secondary Verification Process Completed: Yes History Since Last Visit Electronic Signature(s) Signed: 03/31/2021 1:42:44 PM By: Dolan Amen RN Entered By: Dolan Amen on 03/30/2021 13:14:37 [name] ([id number]) -------------------------------------------------------------------------------- Clinic Level of Care Assessment Details Patient Name: [name]. Date of Service: 03/30/2021 12:45 PM Medical Record Number:  [id number] Patient Account Number: [id number] Date of Birth/Sex: 05-30-1951 (69 y.o. [name]) Treating RN: Dolan Amen Primary Care Magdalynn Davilla: Rutherford Guys Other Clinician: Referring Frederic Tones: Referral, Self Treating Reyan Helle/Extender: Yaakov Guthrie in Treatment: 0 Clinic Level of Care Assessment Items TOOL 1 Quantity Score X - Use when EandM and Procedure is performed on INITIAL visit 1 0 ASSESSMENTS - Nursing Assessment / Reassessment X - General Physical Exam (combine w/ comprehensive assessment (listed just below) when performed on new 1 20 pt. evals) X- 1 25 Comprehensive Assessment (HX, ROS, Risk Assessments, Wounds Hx, etc.) ASSESSMENTS - Wound and Skin Assessment / Reassessment X - Dermatologic / Skin Assessment (not related to wound area) 1 10 ASSESSMENTS - Ostomy and/or Continence Assessment and Care '[]'$  - Incontinence Assessment and Management 0 '[]'$  - 0 Ostomy Care Assessment and Management (repouching, etc.) PROCESS - Coordination of Care X - Simple Patient / Family Education for ongoing care 1 15 '[]'$  - 0 Complex (extensive) Patient / Family Education for ongoing care X- 1 10 Staff obtains Programmer, systems, Records, Test Results / Process Orders '[]'$  - 0 Staff telephones HHA, Nursing Homes / Clarify orders / etc '[]'$  - 0 Routine Transfer to another Facility (non-emergent condition) '[]'$  - 0 Routine Hospital Admission (non-emergent condition) X- 1 15 New Admissions / Biomedical engineer / Ordering NPWT, Apligraf, etc. '[]'$  - 0 Emergency Hospital Admission (emergent condition) PROCESS - Special Needs '[]'$  - Pediatric / Minor Patient Management 0 '[]'$  - 0 Isolation Patient Management '[]'$  - 0 Hearing / Language / Visual special needs '[]'$  - 0 Assessment of Community assistance (transportation, D/C planning, etc.) '[]'$  - 0 Additional assistance / Altered mentation '[]'$  - 0 Support Surface(s) Assessment (bed, cushion, seat, etc.) INTERVENTIONS - Miscellaneous '[]'$  - External ear  exam 0 '[]'$  - 0 Patient Transfer (multiple staff / Civil Service fast streamer / Similar devices) '[]'$  - 0 Simple Staple / Suture removal (25 or less) '[]'$  - 0 Complex Staple / Suture removal (26 or more) '[]'$  - 0 Hypo/Hyperglycemic Management (do not check if billed separately) '[]'$  - 0 Ankle / Brachial Index (ABI) - do not check if billed separately Has the patient been seen at  the hospital within the last three years: Yes Total Score: 95 Level Of Care: New/Established - Level 3 [name], [name] ([id number]) Electronic Signature(s) Signed: 03/31/2021 1:42:44 PM By: Dolan Amen RN Entered By: Dolan Amen on 03/30/2021 14:41:33 [name] ([id number]) -------------------------------------------------------------------------------- Encounter Discharge Information Details Patient Name: [name]. Date of Service: 03/30/2021 12:45 PM Medical Record Number: [id number] Patient Account Number: [id number] Date of Birth/Sex: [birth year] (69 y.o. [name]) Treating RN: Dolan Amen Primary Care Emanuell Morina: Rutherford Guys Other Clinician: Referring Deanda Ruddell: Referral, Self Treating Oralee Rapaport/Extender: Yaakov Guthrie in Treatment: 0 Encounter Discharge Information Items Post Procedure Vitals Discharge Condition: Stable Temperature ([name]): 99.0 Ambulatory Status: Walker Pulse (bpm): 92 Discharge Destination: Home Respiratory Rate (breaths/min): 20 Transportation: Private Auto Blood Pressure (mmHg): 143/85 Accompanied By: friend Schedule Follow-up Appointment: Yes Clinical Summary of Care: Electronic Signature(s) Signed: 03/30/2021 2:43:22 PM By: Dolan Amen RN Entered By: Dolan Amen on 03/30/2021 14:43:22 [name] ([id number]) -------------------------------------------------------------------------------- Lower Extremity Assessment Details Patient Name: [name]. Date of Service: 03/30/2021 12:45 PM Medical Record Number: [id number] Patient Account Number: [id number] Date of  Birth/Sex: 09-Feb-1951 (69 y.o. [name]) Treating RN: Dolan Amen Primary Care Aylana Hirschfeld: Rutherford Guys Other Clinician: Referring Elvia Aydin: Referral, Self Treating Desiree Daise/Extender: Yaakov Guthrie in Treatment: 0 Edema Assessment Assessed: [Left: Yes] [Right: Yes] Edema: [Left: Yes] [Right: Yes] Calf Left: Right: Point of Measurement: 29 cm From Medial Instep 45 cm 45.5 cm Ankle Left: Right: Point of Measurement: 10 cm From Medial Instep 23 cm 23 cm Knee To Floor Left: Right: From Medial Instep 33 cm 33 cm Vascular Assessment Pulses: Dorsalis Pedis Palpable: [Left:Yes] [Right:Yes] Notes unable to perform ABI's d/t wound location/pain Electronic Signature(s) Signed: 03/30/2021 2:38:13 PM By: Dolan Amen RN Entered By: Dolan Amen on 03/30/2021 14:38:13 [name] ([id number]) -------------------------------------------------------------------------------- Multi Wound Chart Details Patient Name: [name]. Date of Service: 03/30/2021 12:45 PM Medical Record Number: [id number] Patient Account Number: [id number] Date of Birth/Sex: [birth year] (69 y.o. 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 Comorbid History: Cataracts, Arrhythmia, Cataracts, Arrhythmia, Cataracts, Arrhythmia, Hypertension, Type II Diabetes, Hypertension, Type II Diabetes, Hypertension, Type II Diabetes, Osteoarthritis, Neuropathy Osteoarthritis, Neuropathy  Osteoarthritis, Neuropathy Date Acquired: 03/23/2021 02/09/2021 03/30/2021 Weeks of Treatment: 0 0 0 Wound Status: Open Open Open Clustered Wound: No No Yes Clustered Quantity: N/A N/A 3 Measurements L x W x D (cm) 3x10.5x0.2 0.4x0.3x0.1 2x1.5x0.2 Area (cm) : 24.74 0.094 2.356 Volume (cm) : 4.948 0.009 0.471 % Reduction in Area: 0.00% 0.00% 0.00% % Reduction in Volume: 0.00% 0.00% 0.00% Classification: Full Thickness Without Exposed Grade 1 Full Thickness Without Exposed Support Structures Support Structures Exudate Amount: Large Medium Medium Exudate Type: Serous Serosanguineous Serous Exudate Color: amber red, brown amber Granulation Amount: Large (67-100%) Medium (34-66%) Medium (34-66%) Granulation Quality: Red Red Red Necrotic Amount: Small (1-33%) Medium (34-66%) Medium (34-66%) Exposed Structures: Fat Layer (Subcutaneous Tissue): Fat Layer (Subcutaneous Tissue): Fat Layer (Subcutaneous Tissue): Yes Yes Yes Fascia: No Fascia: No Fascia: No Tendon: No Tendon: No Tendon: No Muscle: No Muscle: No Muscle: No Joint: No Joint: No Joint: No Bone: No Bone: No  Bone: No Epithelialization: None None Large (67-100%) Debridement: Chemical/Enzymatic/Mechanical Chemical/Enzymatic/Mechanical Chemical/Enzymatic/Mechanical Pre-procedure Verification/Time 13:59 14:00 13:59 Out Taken: Instrument: Other(gauze) Other(cotton tipped applicator) Other(gauze) Bleeding: None None None Debridement Treatment Procedure was tolerated well Procedure was tolerated well Procedure was tolerated well Response: Post Debridement 3x10.5x0.2 0.4x0.3x0.1 2x1.5x0.2 Measurements L x W x D (cm) 4.948 0.009 0.471 [name], ALBRO. ([id number]) Post Debridement Volume: (cm) Procedures Performed: Debridement N/A Debridement Treatment Notes Electronic Signature(s) Signed: 03/31/2021 1:47:04 PM By: Kalman Shan DO Entered By: Kalman Shan on 03/30/2021 14:05:58 [name]  ([id number]) -------------------------------------------------------------------------------- [place] Details Patient Name: [name]. Date of Service: 03/30/2021 12:45 PM Medical Record Number: [id number] Patient Account Number: [id number] Date of Birth/Sex: 08/14/1950 (69 y.o. [name]) Treating RN: Dolan Amen Primary Care Deya Bigos: Rutherford Guys Other Clinician: Referring Tayanna Talford: Referral, Self Treating Mikias Lanz/Extender: Yaakov Guthrie in Treatment: 0 Active Inactive Orientation to the Wound Care Program Nursing Diagnoses: Knowledge deficit related to the wound healing center program Goals: Patient/caregiver will verbalize understanding of the [facility] Program Date Initiated: 03/30/2021 Target Resolution Date: 03/30/2021 Goal Status: Active Interventions: Provide education on orientation to the wound center Notes: Wound/Skin Impairment Nursing Diagnoses: Impaired tissue integrity Goals: Patient/caregiver will verbalize understanding of skin care regimen Date Initiated: 03/30/2021 Target Resolution Date: 03/30/2021 Goal Status: Active Ulcer/skin breakdown will have a volume reduction of 30% by week 4 Date Initiated: 03/30/2021 Target Resolution Date: 04/30/2021 Goal Status: Active Ulcer/skin breakdown will have a volume reduction of 50% by week 8 Date Initiated: 03/30/2021 Target Resolution Date: 05/30/2021 Goal Status: Active Ulcer/skin breakdown will have a volume reduction of 80% by week 12 Date Initiated: 03/30/2021 Target Resolution Date: 06/30/2021 Goal Status: Active Ulcer/skin breakdown will heal within 14 weeks Date Initiated: 03/30/2021 Target Resolution Date: 07/31/2021 Goal Status: Active Interventions: Assess patient/caregiver ability to obtain necessary supplies Assess patient/caregiver ability to perform ulcer/skin care regimen upon admission and as needed Assess ulceration(s) every visit Treatment Activities: Patient  referred to home care : 03/30/2021 Referred to DME Corday Wyka for dressing supplies : 03/30/2021 Skin care regimen initiated : 03/30/2021 Notes: Electronic Signature(s) Signed: 03/31/2021 1:42:44 PM By: Dolan Amen RN Entered By: Dolan Amen on 03/30/2021 13:59:19 [name] ([id number] Garter, Elizabeth Sauer ([id number]) -------------------------------------------------------------------------------- Pain Assessment Details Patient Name: [name]. Date of Service: 03/30/2021 12:45 PM Medical Record Number: [id number] Patient Account Number: [id number] Date of Birth/Sex: Jan 28, 1951 (69 y.o. [name]) Treating RN: Dolan Amen Primary Care Angle Karel: Rutherford Guys Other Clinician: Referring Kinsley Holderman: Referral, Self Treating Topanga Alvelo/Extender: Yaakov Guthrie in Treatment: 0 Active Problems Location of Pain Severity and Description of Pain Patient Has Paino No Site Locations Rate the pain. Current Pain Level: 0 Pain Management and Medication Current Pain Management: Electronic Signature(s) Signed: 03/31/2021 1:42:44 PM By: Dolan Amen RN Entered By: Dolan Amen on 03/30/2021 13:14:43 [name] ([id number]) -------------------------------------------------------------------------------- Patient/Caregiver Education Details Patient Name: [name]. Date of Service: 03/30/2021 12:45 PM Medical Record Number: [id number] Patient Account Number: [id number] Date of Birth/Gender: Mar 15, 1951 (69 y.o. [name]) Treating RN: Dolan Amen Primary Care Physician: Rutherford Guys Other Clinician: Referring Physician: Referral, Self Treating Physician/Extender: Yaakov Guthrie in Treatment: 0 Education Assessment Education Provided To: Patient Education Topics Provided Welcome To [facility]: Methods: Explain/Verbal Responses: State content correctly Wound/Skin Impairment: Methods: Explain/Verbal Responses: State content correctly Electronic  Signature(s) Signed: 03/31/2021 1:42:44 PM By: Dolan Amen RN Entered By: Dolan Amen on 03/30/2021 14:42:03 [name] ([id number]) -------------------------------------------------------------------------------- Wound Assessment Details Patient Name: [name]. Date of Service: 03/30/2021 12:45 PM Medical Record Number: [id number] Patient Account Number: [id number] Date of Birth/Sex: Jan 08, 1951 (69 y.o. [name]) Treating RN: Dolan Amen Primary Care Brendon Christoffel: Rutherford Guys Other Clinician: Referring Suren Payne: Referral, Self Treating Zasha Belleau/Extender: Yaakov Guthrie in Treatment: 0 Wound Status Wound Number: 2 Primary Venous Leg Ulcer Etiology: Wound Location: Right Lower Leg Wound Status: Open Wounding Event: Blister Comorbid Cataracts, Arrhythmia, Hypertension, Type II Diabetes, Date Acquired: 03/23/2021 History: Osteoarthritis, Neuropathy Weeks Of Treatment: 0 Clustered Wound: No Photos Wound Measurements Length: (cm) 3 Width: (cm) 10.5 Depth: (cm) 0.2 Area: (cm) 24.74 Volume: (cm) 4.948 % Reduction in Area: 0% % Reduction in Volume: 0% Epithelialization: None Tunneling: No Undermining: No Wound Description Classification: Full Thickness Without Exposed Support Structu Exudate Amount: Large Exudate Type: Serous Exudate Color: amber res Foul Odor After Cleansing: No Slough/Fibrino No Wound Bed Granulation Amount: Large (67-100%) Exposed Structure Granulation Quality: Red Fascia Exposed: No Necrotic Amount: Small (1-33%) Fat Layer (Subcutaneous Tissue) Exposed: Yes Necrotic Quality: Adherent Slough Tendon Exposed: No Muscle Exposed: No Joint Exposed: No Bone Exposed: No Electronic Signature(s) Signed: 03/31/2021 1:42:44 PM By: Dolan Amen RN Entered By: Dolan Amen on 03/30/2021 13:36:08 [name] ([id number]) -------------------------------------------------------------------------------- Wound Assessment Details Patient  Name: [name]. Date of Service: 03/30/2021 12:45 PM Medical Record Number: [id number] Patient Account Number: [id number] Date of Birth/Sex: [birth year] (69 y.o. [name]) Treating RN: Dolan Amen Primary Care Tamyka Bezio: Rutherford Guys Other Clinician: Referring Frank Novelo: Referral, Self Treating Ossie Yebra/Extender: Yaakov Guthrie in Treatment: 0 Wound Status Wound Number: 3 Primary Diabetic Wound/Ulcer of the Lower Extremity Etiology: Wound Location: Left Toe Great Wound Status: Open Wounding Event: Gradually Appeared Comorbid Cataracts, Arrhythmia, Hypertension, Type II Diabetes, Date Acquired: 02/09/2021 History: Osteoarthritis, Neuropathy Weeks Of Treatment: 0 Clustered Wound: No Photos Wound Measurements Length: (cm) 0.4 % Redu Width: (cm) 0.3 % Redu Depth: (cm) 0.1 Epithe Area: (cm) 0.094 Tunne Volume: (cm) 0.009 Under ction in Area: 0% ction in Volume: 0% lialization: None ling: No mining: No Wound Description Classification: Grade 1 Foul Exudate Amount: Medium Sloug Exudate Type: Serosanguineous Exudate Color: red, brown Odor After Cleansing: No h/Fibrino Yes Wound Bed Granulation Amount: Medium (34-66%) Exposed Structure Granulation Quality: Red Fascia Exposed: No Necrotic Amount: Medium (34-66%) Fat Layer (Subcutaneous Tissue) Exposed: Yes Necrotic Quality: Adherent Slough Tendon Exposed: No Muscle Exposed: No Joint Exposed: No Bone Exposed: No Treatment Notes Wound #3 (Toe Great) Wound Laterality: Left Cleanser Peri-Wound Care Topical Santyl Collagenase Ointment, 30 (gm), tube [name], [name] ([id number]) Discharge Instruction: Apply nickel thick to wound bed only Primary Dressing Secondary Dressing Coverlet Latex-Free Fabric Adhesive Dressings Discharge Instruction: 1.5 x 2 Secured With Compression Wrap Compression Stockings Add-Ons Electronic Signature(s) Signed: 03/31/2021 1:42:44 PM By: Dolan Amen RN Entered By: Dolan Amen  on 03/30/2021 13:36:44 [name] ([id number]) -------------------------------------------------------------------------------- Wound Assessment Details Patient Name: [name]. Date of Service: 03/30/2021 12:45 PM Medical Record Number: [id number] Patient Account Number: [id number] Date of Birth/Sex: 26-Aug-1950 (69 y.o. [name]) Treating RN: Dolan Amen Primary Care Whitley Patchen: Rutherford Guys Other Clinician: Referring Sunday Klos: Referral, Self Treating Damein Gaunce/Extender: Yaakov Guthrie in Treatment: 0 Wound Status Wound Number: 4 Primary Venous Leg Ulcer Etiology: Wound Location: Left Lower Leg Wound Status: Open Wounding Event: Blister Comorbid Cataracts, Arrhythmia, Hypertension, Type II Diabetes, Date Acquired: 03/30/2021 History: Osteoarthritis, Neuropathy Weeks Of Treatment: 0 Clustered Wound: Yes Photos Wound Measurements Length: (cm) 2 Width: (cm) 1.5 Depth: (cm) 0.2 Clustered Quantity: 3 Area: (cm) 2.356 Volume: (cm) 0.471 % Reduction  in Area: 0% % Reduction in Volume: 0% Epithelialization: Large (67-100%) Tunneling: No Undermining: No Wound Description Classification: Full Thickness Without Exposed Support Structures Exudate Amount: Medium Exudate Type: Serous Exudate Color: amber Foul Odor After Cleansing: No Slough/Fibrino Yes Wound Bed Granulation Amount: Medium (34-66%) Exposed Structure Granulation Quality: Red Fascia Exposed: No Necrotic Amount: Medium (34-66%) Fat Layer (Subcutaneous Tissue) Exposed: Yes Necrotic Quality: Adherent Slough Tendon Exposed: No Muscle Exposed: No Joint Exposed: No Bone Exposed: No Treatment Notes Wound #4 (Lower Leg) Wound Laterality: Left Cleanser Wound Cleanser Discharge Instruction: Wash your hands with soap and water. Remove old dressing, discard into plastic bag and place into trash. Cleanse the wound with Wound Cleanser prior to applying a clean dressing using gauze sponges, not tissues or  cotton balls. Do not scrub or use excessive force. Pat dry using gauze sponges, not tissue or cotton balls. [name], [name] ([id number]) Peri-Wound Care Topical Primary Dressing Hydrofera Blue Ready Transfer Foam, 2.5x2.5 (in/in) Discharge Instruction: Apply Hydrofera Blue Ready to wound bed as directed Secondary Dressing ABD Pad 5x9 (in/in) Discharge Instruction: Cover with ABD pad Kerlix 4.5 x 4.1 (in/yd) Discharge Instruction: Apply Kerlix from base of toes to 3-finger-widths below knee bend Secured With Coban Cohesive Bandage 4x5 (yds) Stretched Discharge Instruction: Apply coban from base of toes to 3-finger-widths below knee bend Compression Wrap Compression Stockings Circaid Juxta Lite Compression Wrap Quantity: 1 Left Leg Compression Amount: 20-30 mmHg Discharge Instruction: Apply Circaid Juxta Lite Compression Wrap as directed Add-Ons Electronic Signature(s) Signed: 03/31/2021 1:42:44 PM By: Dolan Amen RN Entered By: Dolan Amen on 03/30/2021 13:57:44 [name] ([id number]) -------------------------------------------------------------------------------- [place] Details Patient Name: [name]. Date of Service: 03/30/2021 12:45 PM Medical Record Number: [id number] Patient Account Number: [id number] Date of Birth/Sex: [birth year] (69 y.o. [name]) Treating RN: Dolan Amen Primary Care Chesni Vos: Rutherford Guys Other Clinician: Referring Malania Gawthrop: Referral, Self Treating Daisy Mcneel/Extender: Yaakov Guthrie in Treatment: 0 Vital Signs Time Taken: 13:14 Temperature ([name]): 99.0 Height (in): 66 Pulse (bpm): 92 Source: Stated Respiratory Rate (breaths/min): 20 Weight (lbs): 332 Blood Pressure (mmHg): 143/85 Source: Stated Reference Range: 80 - 120 mg / dl Body Mass Index (BMI): 53.6 Notes unable to get weight in office d/t pt being unsteady Electronic Signature(s) Signed: 03/31/2021 1:42:44 PM By: Dolan Amen RN Entered By: Dolan Amen on  03/30/2021 13:16:45

## 2021-04-06 ENCOUNTER — Other Ambulatory Visit: Payer: Self-pay

## 2021-04-06 ENCOUNTER — Encounter (HOSPITAL_BASED_OUTPATIENT_CLINIC_OR_DEPARTMENT_OTHER): Payer: Medicare Other | Admitting: Internal Medicine

## 2021-04-06 DIAGNOSIS — S91302D Unspecified open wound, left foot, subsequent encounter: Secondary | ICD-10-CM | POA: Diagnosis not present

## 2021-04-06 DIAGNOSIS — I87331 Chronic venous hypertension (idiopathic) with ulcer and inflammation of right lower extremity: Secondary | ICD-10-CM

## 2021-04-06 DIAGNOSIS — I87332 Chronic venous hypertension (idiopathic) with ulcer and inflammation of left lower extremity: Secondary | ICD-10-CM | POA: Diagnosis not present

## 2021-04-06 NOTE — Progress Notes (Signed)
LOISTEEN, MUKHERJEE ([id number]) Visit Report for 04/06/2021 Arrival Information Details Patient Name: [name], [name]. Date of Service: 04/06/2021 12:45 PM Medical Record Number: [id number] Patient Account Number: [id number] Date of Birth/Sex: 1950-10-22 (69 y.o. F) Treating RN: Dolan Amen Primary Care Heru Montz: Rutherford Guys Other Clinician: Referring Tayt Moyers: Rutherford Guys Treating Shakeisha Horine/Extender: Yaakov Guthrie in Treatment: 1 Visit Information History Since Last Visit Pain Present Now: No Patient Arrived: Walker Arrival Time: 13:01 Accompanied By: self Transfer Assistance: None Patient Identification Verified: Yes Secondary Verification Process Completed: Yes Electronic Signature(s) Signed: 04/06/2021 4:18:42 PM By: Dolan Amen RN Entered By: Dolan Amen on 04/06/2021 13:01:17 [name] ([id number]) -------------------------------------------------------------------------------- Clinic Level of Care Assessment Details Patient Name: [name]. Date of Service: 04/06/2021 12:45 PM Medical Record Number: [id number] Patient Account Number: [id number] Date of Birth/Sex: 14-Feb-1951 (69 y.o. F) Treating RN: Dolan Amen Primary Care Damonie Ellenwood: Rutherford Guys Other Clinician: Referring Tayjah Lobdell: Rutherford Guys Treating Abdulahi Schor/Extender: Yaakov Guthrie in Treatment: 1 Clinic Level of Care Assessment Items TOOL 4 Quantity Score X - Use when only an EandM is performed on FOLLOW-UP visit 1 0 ASSESSMENTS - Nursing Assessment / Reassessment X - Reassessment of Co-morbidities (includes updates in patient status) 1 10 X- 1 5 Reassessment of Adherence to Treatment Plan ASSESSMENTS - Wound and Skin Assessment / Reassessment '[]'$  - Simple Wound Assessment / Reassessment - one wound 0 X- 3 5 Complex Wound Assessment / Reassessment - multiple wounds '[]'$  - 0 Dermatologic / Skin Assessment (not related to wound area) ASSESSMENTS - Focused Assessment '[]'$  -  Circumferential Edema Measurements - multi extremities 0 '[]'$  - 0 Nutritional Assessment / Counseling / Intervention '[]'$  - 0 Lower Extremity Assessment (monofilament, tuning fork, pulses) '[]'$  - 0 Peripheral Arterial Disease Assessment (using hand held doppler) ASSESSMENTS - Ostomy and/or Continence Assessment and Care '[]'$  - Incontinence Assessment and Management 0 '[]'$  - 0 Ostomy Care Assessment and Management (repouching, etc.) PROCESS - Coordination of Care X - Simple Patient / Family Education for ongoing care 1 15 '[]'$  - 0 Complex (extensive) Patient / Family Education for ongoing care '[]'$  - 0 Staff obtains Programmer, systems, Records, Test Results / Process Orders '[]'$  - 0 Staff telephones HHA, Nursing Homes / Clarify orders / etc '[]'$  - 0 Routine Transfer to another Facility (non-emergent condition) '[]'$  - 0 Routine Hospital Admission (non-emergent condition) '[]'$  - 0 New Admissions / Biomedical engineer / Ordering NPWT, Apligraf, etc. '[]'$  - 0 Emergency Hospital Admission (emergent condition) X- 1 10 Simple Discharge Coordination '[]'$  - 0 Complex (extensive) Discharge Coordination PROCESS - Special Needs '[]'$  - Pediatric / Minor Patient Management 0 '[]'$  - 0 Isolation Patient Management '[]'$  - 0 Hearing / Language / Visual special needs '[]'$  - 0 Assessment of Community assistance (transportation, D/C planning, etc.) '[]'$  - 0 Additional assistance / Altered mentation '[]'$  - 0 Support Surface(s) Assessment (bed, cushion, seat, etc.) INTERVENTIONS - Wound Cleansing / Measurement ALLICE, SHIU. ([id number]) X- 1 5 Simple Wound Cleansing - one wound '[]'$  - 0 Complex Wound Cleansing - multiple wounds X- 1 5 Wound Imaging (photographs - any number of wounds) '[]'$  - 0 Wound Tracing (instead of photographs) '[]'$  - 0 Simple Wound Measurement - one wound X- 3 5 Complex Wound Measurement - multiple wounds INTERVENTIONS - Wound Dressings '[]'$  - Small Wound Dressing one or multiple wounds 0 X- 1 15 Medium  Wound Dressing one or multiple wounds '[]'$  - 0 Large Wound Dressing one or multiple wounds '[]'$  - 0 Application of Medications -  topical '[]'$  - 0 Application of Medications - injection INTERVENTIONS - Miscellaneous '[]'$  - External ear exam 0 '[]'$  - 0 Specimen Collection (cultures, biopsies, blood, body fluids, etc.) '[]'$  - 0 Specimen(s) / Culture(s) sent or taken to Lab for analysis '[]'$  - 0 Patient Transfer (multiple staff / Harrel Lemon Lift / Similar devices) '[]'$  - 0 Simple Staple / Suture removal (25 or less) '[]'$  - 0 Complex Staple / Suture removal (26 or more) '[]'$  - 0 Hypo / Hyperglycemic Management (close monitor of Blood Glucose) '[]'$  - 0 Ankle / Brachial Index (ABI) - do not check if billed separately X- 1 5 Vital Signs Has the patient been seen at the hospital within the last three years: Yes Total Score: 100 Level Of Care: New/Established - Level 3 Electronic Signature(s) Signed: 04/06/2021 4:18:42 PM By: Dolan Amen RN Entered By: Dolan Amen on 04/06/2021 13:43:27 [name] ([id number]) -------------------------------------------------------------------------------- Encounter Discharge Information Details Patient Name: [name]. Date of Service: 04/06/2021 12:45 PM Medical Record Number: [id number] Patient Account Number: [id number] Date of Birth/Sex: Dec 21, 1950 (69 y.o. F) Treating RN: Dolan Amen Primary Care Derius Ghosh: Rutherford Guys Other Clinician: Referring Glenn Christo: Rutherford Guys Treating Estiven Kohan/Extender: Yaakov Guthrie in Treatment: 1 Encounter Discharge Information Items Discharge Condition: Stable Ambulatory Status: Walker Discharge Destination: Home Transportation: Private Auto Accompanied By: self Schedule Follow-up Appointment: Yes Clinical Summary of Care: Electronic Signature(s) Signed: 04/06/2021 2:16:53 PM By: Dolan Amen RN Entered By: Dolan Amen on 04/06/2021 14:16:53 [name]  ([id number]) -------------------------------------------------------------------------------- Lower Extremity Assessment Details Patient Name: [name]. Date of Service: 04/06/2021 12:45 PM Medical Record Number: [id number] Patient Account Number: [id number] Date of Birth/Sex: 1951-01-14 (69 y.o. F) Treating RN: Dolan Amen Primary Care Yesica Kemler: Rutherford Guys Other Clinician: Referring Kaley Jutras: Rutherford Guys Treating Suhani Stillion/Extender: Yaakov Guthrie in Treatment: 1 Edema Assessment Assessed: [Left: Yes] [Right: Yes] Edema: [Left: Yes] [Right: Yes] Calf Left: Right: Point of Measurement: 29 cm From Medial Instep 45 cm 49.5 cm Ankle Left: Right: Point of Measurement: 10 cm From Medial Instep 21.5 cm 22 cm Electronic Signature(s) Signed: 04/06/2021 4:18:42 PM By: Dolan Amen RN Entered By: Dolan Amen on 04/06/2021 13:14:15 [name] ([id number]) -------------------------------------------------------------------------------- Multi Wound Chart Details Patient Name: [name]. Date of Service: 04/06/2021 12:45 PM Medical Record Number: [id number] Patient Account Number: [id number] Date of Birth/Sex: 06-Mar-1951 (69 y.o. 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 Comorbid History: Cataracts, Arrhythmia, Cataracts, Arrhythmia, Cataracts, Arrhythmia, Hypertension, Type II Diabetes, Hypertension, Type II Diabetes,  Hypertension, Type II Diabetes, Osteoarthritis, Neuropathy Osteoarthritis, Neuropathy Osteoarthritis, Neuropathy Date Acquired: 03/23/2021 02/09/2021 03/30/2021 Weeks of Treatment: '1 1 1 '$ Wound Status: Open Open Open Clustered Wound: No No Yes Clustered Quantity: N/A N/A 3 Measurements L x W x D (cm) 3.5x10x0.1 0.1x0.1x0.1 6x1.5x0.2 Area (cm) : 27.489 0.008 7.069 Volume (cm) : 2.749 0.001 1.414 % Reduction in Area: -11.10% 91.50% -200.00% % Reduction in Volume: 44.40% 88.90% -200.20% Classification: Full Thickness Without Exposed Grade 1 Full Thickness Without Exposed Support Structures Support Structures Exudate Amount: Large None Present Medium Exudate Type: Serous N/A Serous Exudate Color: amber N/A amber  Granulation Amount: Large (67-100%) None Present (0%) Medium (34-66%) Granulation Quality: Red N/A Red Necrotic Amount: None Present (0%) None Present (0%) Medium (34-66%) Exposed Structures: Fat Layer (Subcutaneous Tissue): Fascia: No Fat Layer (Subcutaneous Tissue): Yes Fat Layer (Subcutaneous Tissue): Yes Fascia: No No Fascia: No Tendon: No Tendon: No Tendon: No Muscle: No Muscle: No Muscle: No Joint: No Joint: No Joint: No Bone: No Bone: No Bone: No Epithelialization: None None Large (67-100%) Treatment Notes Electronic Signature(s) Signed: 04/06/2021 2:08:43 PM By: Kalman Shan DO Entered By: Kalman Shan on 04/06/2021 14:03:49 [name] ([id number] [name] ([id number]) -------------------------------------------------------------------------------- [place] Details Patient Name: NOHELI, BOETTNER. Date of Service: 04/06/2021 12:45 PM Medical Record Number: [id number] Patient Account Number: [id number] Date of Birth/Sex: October 09, 1950 (69 y.o. F) Treating RN: Dolan Amen Primary Care Jaquawn Saffran: Rutherford Guys Other Clinician: Referring Debi Cousin: Rutherford Guys Treating Ashlynn Gunnels/Extender: Yaakov Guthrie in  Treatment: 1 Active Inactive Wound/Skin Impairment Nursing Diagnoses: Impaired tissue integrity Goals: Patient/caregiver will verbalize understanding of skin care regimen Date Initiated: 03/30/2021 Target Resolution Date: 03/30/2021 Goal Status: Active Ulcer/skin breakdown will have a volume reduction of 30% by week 4 Date Initiated: 03/30/2021 Target Resolution Date: 04/30/2021 Goal Status: Active Ulcer/skin breakdown will have a volume reduction of 50% by week 8 Date Initiated: 03/30/2021 Target Resolution Date: 05/30/2021 Goal Status: Active Ulcer/skin breakdown will have a volume reduction of 80% by week 12 Date Initiated: 03/30/2021 Target Resolution Date: 06/30/2021 Goal Status: Active Ulcer/skin breakdown will heal within 14 weeks Date Initiated: 03/30/2021 Target Resolution Date: 07/31/2021 Goal Status: Active Interventions: Assess patient/caregiver ability to obtain necessary supplies Assess patient/caregiver ability to perform ulcer/skin care regimen upon admission and as needed Assess ulceration(s) every visit Treatment Activities: Patient referred to home care : 03/30/2021 Referred to DME Kizzi Overbey for dressing supplies : 03/30/2021 Skin care regimen initiated : 03/30/2021 Notes: Electronic Signature(s) Signed: 04/06/2021 4:18:42 PM By: Dolan Amen RN Entered By: Dolan Amen on 04/06/2021 13:38:45 [name] ([id number]) -------------------------------------------------------------------------------- Pain Assessment Details Patient Name: [name]. Date of Service: 04/06/2021 12:45 PM Medical Record Number: [id number] Patient Account Number: [id number] Date of Birth/Sex: Apr 22, 1951 (69 y.o. F) Treating RN: Dolan Amen Primary Care Ericah Scotto: Rutherford Guys Other Clinician: Referring Pattiann Solanki: Rutherford Guys Treating Pheonix Clinkscale/Extender: Yaakov Guthrie in Treatment: 1 Active Problems Location of Pain Severity and Description of Pain Patient Has Paino  No Site Locations Pain Management and Medication Current Pain Management: Electronic Signature(s) Signed: 04/06/2021 4:18:42 PM By: Dolan Amen RN Entered By: Dolan Amen on 04/06/2021 13:01:47 [name] ([id number]) -------------------------------------------------------------------------------- Patient/Caregiver Education Details Patient Name: [name]. Date of Service: 04/06/2021 12:45 PM Medical Record Number: [id number] Patient Account Number: [id number] Date of Birth/Gender: 20-Oct-1950 (69 y.o. F) Treating RN: Dolan Amen Primary Care Physician: Rutherford Guys Other Clinician: Referring Physician: Rutherford Guys Treating Physician/Extender: Yaakov Guthrie in Treatment: 1 Education Assessment Education Provided To: Patient Education Topics Provided Wound/Skin Impairment: Methods: Explain/Verbal Responses: State content correctly Electronic Signature(s) Signed: 04/06/2021 4:18:42 PM By: Dolan Amen RN Entered By: Dolan Amen on 04/06/2021 13:58:42 [name] ([id number]) -------------------------------------------------------------------------------- Wound Assessment Details Patient Name: [name]. Date of Service: 04/06/2021 12:45 PM Medical Record Number: [id number] Patient Account Number: [id number] Date of Birth/Sex: Dec 26, 1950 (69 y.o. F) Treating RN: Dolan Amen Primary Care Donnalee Cellucci: Rutherford Guys Other Clinician: Referring Andilyn Bettcher: Rutherford Guys Treating Maicy Filip/Extender: Yaakov Guthrie in Treatment: 1 Wound Status Wound Number: 2 Primary Venous Leg Ulcer Etiology: Wound Location: Right Lower Leg Wound Status: Open  Wounding Event: Blister Comorbid Cataracts, Arrhythmia, Hypertension, Type II Diabetes, Date Acquired: 03/23/2021 History: Osteoarthritis, Neuropathy Weeks Of Treatment: 1 Clustered Wound: No Photos Wound Measurements Length: (cm) 3.5 Width: (cm) 10 Depth: (cm) 0.1 Area: (cm)  27.489 Volume: (cm) 2.749 % Reduction in Area: -11.1% % Reduction in Volume: 44.4% Epithelialization: None Tunneling: No Undermining: No Wound Description Classification: Full Thickness Without Exposed Support Structures Exudate Amount: Large Exudate Type: Serous Exudate Color: amber Foul Odor After Cleansing: No Slough/Fibrino No Wound Bed Granulation Amount: Large (67-100%) Exposed Structure Granulation Quality: Red Fascia Exposed: No Necrotic Amount: None Present (0%) Fat Layer (Subcutaneous Tissue) Exposed: Yes Tendon Exposed: No Muscle Exposed: No Joint Exposed: No Bone Exposed: No Treatment Notes Wound #2 (Lower Leg) Wound Laterality: Right Cleanser Wound Cleanser Discharge Instruction: Wash your hands with soap and water. Remove old dressing, discard into plastic bag and place into trash. Cleanse the wound with Wound Cleanser prior to applying a clean dressing using gauze sponges, not tissues or cotton balls. Do not scrub or use excessive force. Pat dry using gauze sponges, not tissue or cotton balls. ABBYGAEL, TARNOWSKI ([id number]) Peri-Wound Care Topical Primary Dressing Hydrofera Blue Ready Transfer Foam, 2.5x2.5 (in/in) Discharge Instruction: Apply Hydrofera Blue Ready to wound bed as directed Secondary Dressing ABD Pad 5x9 (in/in) Discharge Instruction: Cover with ABD pad Kerlix 4.5 x 4.1 (in/yd) Discharge Instruction: Apply Kerlix from base of toes to 3-finger-widths below knee bend Secured With Coban Cohesive Bandage 4x5 (yds) Stretched Discharge Instruction: Apply coban from base of toes to 3-finger-widths below knee bend Compression Wrap Compression Stockings Add-Ons Electronic Signature(s) Signed: 04/06/2021 4:18:42 PM By: Dolan Amen RN Entered By: Dolan Amen on 04/06/2021 13:11:39 [name] ([id number]) -------------------------------------------------------------------------------- Wound Assessment Details Patient Name: [name]. Date of Service: 04/06/2021 12:45 PM Medical Record Number: [id number] Patient Account Number: [id number] Date of Birth/Sex: 04-18-1951 (69 y.o. F) Treating RN: Dolan Amen Primary Care Elain Wixon: Rutherford Guys Other Clinician: Referring Saori Umholtz: Rutherford Guys Treating Derhonda Eastlick/Extender: Yaakov Guthrie in Treatment: 1 Wound Status Wound Number: 3 Primary Diabetic Wound/Ulcer of the Lower Extremity Etiology: Wound Location: Left Toe Great Wound Status: Open Wounding Event: Gradually Appeared Comorbid Cataracts, Arrhythmia, Hypertension, Type II Diabetes, Date Acquired: 02/09/2021 History: Osteoarthritis, Neuropathy Weeks Of Treatment: 1 Clustered Wound: No Photos Wound Measurements Length: (cm) 0.1 Width: (cm) 0.1 Depth: (cm) 0.1 Area: (cm) 0.008 Volume: (cm) 0.001 % Reduction in Area: 91.5% % Reduction in Volume: 88.9% Epithelialization: None Tunneling: No Undermining: No Wound Description Classification: Grade 1 Exudate Amount: None Present Foul Odor After Cleansing: No Slough/Fibrino No Wound Bed Granulation Amount: None Present (0%) Exposed Structure Necrotic Amount: None Present (0%) Fascia Exposed: No Fat Layer (Subcutaneous Tissue) Exposed: No Tendon Exposed: No Muscle Exposed: No Joint Exposed: No Bone Exposed: No Treatment Notes Wound #3 (Toe Great) Wound Laterality: Left Cleanser Peri-Wound Care Topical Primary Dressing Hydrofera Blue Ready Transfer Foam, 2.5x2.5 (in/in) TAYTUM, LEGACY ([id number]) Discharge Instruction: Apply Hydrofera Blue Ready to wound bed as directed Secondary Dressing Coverlet Latex-Free Fabric Adhesive Dressings Discharge Instruction: 1.5 x 2 Secured With Compression Wrap Compression Stockings Add-Ons Electronic Signature(s) Signed: 04/06/2021 4:18:42 PM By: Dolan Amen RN Entered By: Dolan Amen on 04/06/2021 13:12:05 [name]  ([id number]) -------------------------------------------------------------------------------- Wound Assessment Details Patient Name: [name]. Date of Service: 04/06/2021 12:45 PM Medical Record Number: [id number] Patient Account Number: [id number] Date of Birth/Sex: [birth year] (69 y.o. F) Treating RN: Dolan Amen Primary Care Ezell Melikian: Rutherford Guys Other Clinician: Referring Shanera Meske: Rutherford Guys  Treating Kirstyn Lean/Extender: Yaakov Guthrie in Treatment: 1 Wound Status Wound Number: 4 Primary Venous Leg Ulcer Etiology: Wound Location: Left Lower Leg Wound Status: Open Wounding Event: Blister Comorbid Cataracts, Arrhythmia, Hypertension, Type II Diabetes, Date Acquired: 03/30/2021 History: Osteoarthritis, Neuropathy Weeks Of Treatment: 1 Clustered Wound: Yes Photos Wound Measurements Length: (cm) 6 Width: (cm) 1.5 Depth: (cm) 0.2 Clustered Quantity: 3 Area: (cm) 7.069 Volume: (cm) 1.414 % Reduction in Area: -200% % Reduction in Volume: -200.2% Epithelialization: Large (67-100%) Tunneling: No Undermining: No Wound Description Classification: Full Thickness Without Exposed Support Structures Exudate Amount: Medium Exudate Type: Serous Exudate Color: amber Foul Odor After Cleansing: No Slough/Fibrino Yes Wound Bed Granulation Amount: Medium (34-66%) Exposed Structure Granulation Quality: Red Fascia Exposed: No Necrotic Amount: Medium (34-66%) Fat Layer (Subcutaneous Tissue) Exposed: Yes Necrotic Quality: Adherent Slough Tendon Exposed: No Muscle Exposed: No Joint Exposed: No Bone Exposed: No Treatment Notes Wound #4 (Lower Leg) Wound Laterality: Left Cleanser Wound Cleanser Discharge Instruction: Wash your hands with soap and water. Remove old dressing, discard into plastic bag and place into trash. Cleanse the wound with Wound Cleanser prior to applying a clean dressing using gauze sponges, not tissues or cotton balls. Do not scrub or use  excessive force. Pat dry using gauze sponges, not tissue or cotton balls. KAJAH, ARLT ([id number]) Peri-Wound Care Topical Primary Dressing Hydrofera Blue Ready Transfer Foam, 2.5x2.5 (in/in) Discharge Instruction: Apply Hydrofera Blue Ready to wound bed as directed Secondary Dressing ABD Pad 5x9 (in/in) Discharge Instruction: Cover with ABD pad Kerlix 4.5 x 4.1 (in/yd) Discharge Instruction: Apply Kerlix from base of toes to 3-finger-widths below knee bend Secured With Coban Cohesive Bandage 4x5 (yds) Stretched Discharge Instruction: Apply coban from base of toes to 3-finger-widths below knee bend Compression Wrap Compression Stockings Add-Ons Electronic Signature(s) Signed: 04/06/2021 4:18:42 PM By: Dolan Amen RN Entered By: Dolan Amen on 04/06/2021 13:12:27 [name] ([id number]) -------------------------------------------------------------------------------- [place] Details Patient Name: [name]. Date of Service: 04/06/2021 12:45 PM Medical Record Number: [id number] Patient Account Number: [id number] Date of Birth/Sex: 12-23-1950 (69 y.o. F) Treating RN: Dolan Amen Primary Care Jacquel Redditt: Rutherford Guys Other Clinician: Referring Login Muckleroy: Rutherford Guys Treating Eretria Manternach/Extender: Yaakov Guthrie in Treatment: 1 Vital Signs Time Taken: 13:01 Temperature (F): 98.2 Height (in): 66 Pulse (bpm): 97 Weight (lbs): 332 Respiratory Rate (breaths/min): 20 Body Mass Index (BMI): 53.6 Blood Pressure (mmHg): 152/66 Reference Range: 80 - 120 mg / dl Electronic Signature(s) Signed: 04/06/2021 4:18:42 PM By: Dolan Amen RN Entered By: Dolan Amen on 04/06/2021 13:01:31

## 2021-04-06 NOTE — Progress Notes (Signed)
TALEIGH, SEPE ([id number]) Visit Report for 04/06/2021 Chief Complaint Document Details Patient Name: [name], [name]. Date of Service: 04/06/2021 12:45 PM Medical Record Number: [id number] Patient Account Number: [id number] Date of Birth/Sex: 1951/02/03 (69 y.o. F) Treating RN: Dolan Amen Primary Care Provider: Rutherford Guys Other Clinician: Referring Provider: Rutherford Guys Treating Provider/Extender: Yaakov Guthrie in Treatment: 1 Information Obtained from: Patient Chief Complaint Bilateral lower extremity wounds Electronic Signature(s) Signed: 04/06/2021 2:08:43 PM By: Kalman Shan DO Entered By: Kalman Shan on 04/06/2021 14:03:58 [name] ([id number]) -------------------------------------------------------------------------------- HPI Details Patient Name: [name]. Date of Service: 04/06/2021 12:45 PM Medical Record Number: [id number] Patient Account Number: [id number] Date of Birth/Sex: [birth year] (69 y.o. F) Treating RN: Dolan Amen Primary Care Provider: Rutherford Guys Other Clinician: Referring Provider: Rutherford Guys Treating Provider/Extender: Yaakov Guthrie in Treatment: 1 History of Present Illness HPI Description: Admission 10/5 Ms. Evienne Hibbert is a 70 year old female with a past medical history of hypertension, venous insufficiency, chronic diastolic heart failure and insulin-dependent type 2 diabetes that presents To the clinic for bilateral lower extremity wounds. She states that her right lower extremity developed a blister 1 week ago and she has been keeping the area covered with a bandaid. She states that when the Band-Aid came off it tore her skin. She subsequently developed pain, increased warmth and erythema to the right lower extremity. She denies purulent drainage. She has also developed an open wound to the left lower extremity that recently developed. She also has a small open wound to the proximal portion of  the left great toe. This has been present since August and was evaluated by podiatry. It has not healed and she has not followed up with podiatry for this issue. 10/12; patient presents for follow-up. She states she has 2 days left on her antibiotics. She is tolerated this well. She reports improvement to her right lower extremity symptoms. She denies signs of infection. She has home health. Electronic Signature(s) Signed: 04/06/2021 2:08:43 PM By: Kalman Shan DO Entered By: Kalman Shan on 04/06/2021 14:04:45 [name] ([id number]) -------------------------------------------------------------------------------- Physical Exam Details Patient Name: [name]. Date of Service: 04/06/2021 12:45 PM Medical Record Number: [id number] Patient Account Number: [id number] Date of Birth/Sex: [birth year] (69 y.o. F) Treating RN: Dolan Amen Primary Care Provider: Rutherford Guys Other Clinician: Referring Provider: Rutherford Guys Treating Provider/Extender: Yaakov Guthrie in Treatment: 1 Constitutional . Cardiovascular . Psychiatric . Notes Right lower extremity: Skin tear to the anterior aspect with Granulation tissue present. No increased warmth and erythema to the lower leg. No purulent drainage. Left lower extremity: To the distal medial portion there is an open wound with granulation tissue present. Left foot: To the proximal portion of the left great toe there is a small area with Granulation tissue present. Electronic Signature(s) Signed: 04/06/2021 2:08:43 PM By: Kalman Shan DO Entered By: Kalman Shan on 04/06/2021 14:05:44 [name] ([id number]) -------------------------------------------------------------------------------- Physician Orders Details Patient Name: [name]. Date of Service: 04/06/2021 12:45 PM Medical Record Number: [id number] Patient Account Number: [id number] Date of Birth/Sex: 07-17-1950 (69 y.o. F) Treating RN:  Dolan Amen Primary Care Provider: Rutherford Guys Other Clinician: Referring Provider: Rutherford Guys Treating Provider/Extender: Yaakov Guthrie in Treatment: 1 Verbal / Phone Orders: No Diagnosis Coding Follow-up Appointments o Return Appointment in 2 weeks. [place] for wound care. May utilize formulary equivalent dressing for wound treatment orders unless otherwise specified. Home Health Nurse may  visit PRN to address patientos wound care needs. o Scheduled days for dressing changes to be completed; exception, patient has scheduled wound care visit that day. o **Please direct any NON-WOUND related issues/requests for orders to patient's Primary Care Physician. **If current dressing causes regression in wound condition, may D/C ordered dressing product/s and apply Normal Saline Moist Dressing daily until next [place] or Other MD appointment. **[facility] of regression in wound condition at [phone]. Bathing/ Shower/ Hygiene o May shower with wound dressing protected with water repellent cover or cast protector. o No tub bath. Medications-Please add to medication list. o P.O. Antibiotics - Finish antibiotics Wound Treatment Wound #2 - Lower Leg Wound Laterality: Right Cleanser: Wound Cleanser 3 x Per Week/30 Days Discharge Instructions: Wash your hands with soap and water. Remove old dressing, discard into plastic bag and place into trash. Cleanse the wound with Wound Cleanser prior to applying a clean dressing using gauze sponges, not tissues or cotton balls. Do not scrub or use excessive force. Pat dry using gauze sponges, not tissue or cotton balls. Primary Dressing: Hydrofera Blue Ready Transfer Foam, 2.5x2.5 (in/in) (Generic) 3 x Per Week/30 Days Discharge Instructions: Apply Hydrofera Blue Ready to wound bed as directed Secondary Dressing: ABD Pad 5x9 (in/in)  (Generic) 3 x Per Week/30 Days Discharge Instructions: Cover with ABD pad Secondary Dressing: Kerlix 4.5 x 4.1 (in/yd) (Generic) 3 x Per Week/30 Days Discharge Instructions: Apply Kerlix from base of toes to 3-finger-widths below knee bend Secured With: Coban Cohesive Bandage 4x5 (yds) Stretched (Generic) 3 x Per Week/30 Days Discharge Instructions: Apply coban from base of toes to 3-finger-widths below knee bend Wound #3 - Toe Great Wound Laterality: Left Primary Dressing: Hydrofera Blue Ready Transfer Foam, 2.5x2.5 (in/in) 3 x Per Week/30 Days Discharge Instructions: Apply Hydrofera Blue Ready to wound bed as directed Secondary Dressing: Coverlet Latex-Free Fabric Adhesive Dressings 3 x Per Week/30 Days Discharge Instructions: 1.5 x 2 Wound #4 - Lower Leg Wound Laterality: Left Cleanser: Wound Cleanser 3 x Per Week/30 Days Discharge Instructions: Wash your hands with soap and water. Remove old dressing, discard into plastic bag and place into trash. Cleanse the wound with Wound Cleanser prior to applying a clean dressing using gauze sponges, not tissues or cotton balls. Do not scrub or use excessive force. Pat dry using gauze sponges, not tissue or cotton balls. Primary Dressing: Hydrofera Blue Ready Transfer Foam, 2.5x2.5 (in/in) (Generic) 3 x Per Week/30 Days Discharge Instructions: Apply Hydrofera Blue Ready to wound bed as directed [name], [name] ([name]) Secondary Dressing: ABD Pad 5x9 (in/in) (Generic) 3 x Per Week/30 Days Discharge Instructions: Cover with ABD pad Secondary Dressing: Kerlix 4.5 x 4.1 (in/yd) (Generic) 3 x Per Week/30 Days Discharge Instructions: Apply Kerlix from base of toes to 3-finger-widths below knee bend Secured With: Coban Cohesive Bandage 4x5 (yds) Stretched (Generic) 3 x Per Week/30 Days Discharge Instructions: Apply coban from base of toes to 3-finger-widths below knee bend Electronic Signature(s) Signed: 04/06/2021 2:08:43 PM By: Kalman Shan  DO Signed: 04/06/2021 4:18:42 PM By: Dolan Amen RN Entered By: Dolan Amen on 04/06/2021 13:42:26 [name] ([name]) -------------------------------------------------------------------------------- Problem List Details Patient Name: [name]. Date of Service: 04/06/2021 12:45 PM Medical Record Number: [name] Patient Account Number: [id number] Date of Birth/Sex: March 23, 1951 (69 y.o. F) Treating RN: Dolan Amen Primary Care Provider: Rutherford Guys Other Clinician: Referring Provider: Rutherford Guys Treating Provider/Extender: Yaakov Guthrie in Treatment: 1 Active Problems ICD-10 Encounter Code Description Active Date  MDM Diagnosis I87.331 Chronic venous hypertension (idiopathic) with ulcer and inflammation of 03/30/2021 No Yes right lower extremity I87.332 Chronic venous hypertension (idiopathic) with ulcer and inflammation of 03/30/2021 No Yes left lower extremity S91.302D Unspecified open wound, left foot, subsequent encounter 03/30/2021 No Yes Inactive Problems Resolved Problems Electronic Signature(s) Signed: 04/06/2021 2:08:43 PM By: Kalman Shan DO Entered By: Kalman Shan on 04/06/2021 14:03:41 [name] ([id number]) -------------------------------------------------------------------------------- Progress Note Details Patient Name: [name]. Date of Service: 04/06/2021 12:45 PM Medical Record Number: [id number] Patient Account Number: [id number] Date of Birth/Sex: July 28, 1950 (69 y.o. F) Treating RN: Dolan Amen Primary Care Provider: Rutherford Guys Other Clinician: Referring Provider: Rutherford Guys Treating Provider/Extender: Yaakov Guthrie in Treatment: 1 Subjective Chief Complaint Information obtained from Patient Bilateral lower extremity wounds History of Present Illness (HPI) Admission 10/5 Ms. Avett Buonocore is a 71 year old female with a past medical history of hypertension, venous insufficiency,  chronic diastolic heart failure and insulin-dependent type 2 diabetes that presents To the clinic for bilateral lower extremity wounds. She states that her right lower extremity developed a blister 1 week ago and she has been keeping the area covered with a bandaid. She states that when the Band-Aid came off it tore her skin. She subsequently developed pain, increased warmth and erythema to the right lower extremity. She denies purulent drainage. She has also developed an open wound to the left lower extremity that recently developed. She also has a small open wound to the proximal portion of the left great toe. This has been present since August and was evaluated by podiatry. It has not healed and she has not followed up with podiatry for this issue. 10/12; patient presents for follow-up. She states she has 2 days left on her antibiotics. She is tolerated this well. She reports improvement to her right lower extremity symptoms. She denies signs of infection. She has home health. Patient History Information obtained from Patient. Family History Cancer - Maternal Grandparents,Paternal Grandparents,Mother,Father, Diabetes - Maternal Grandparents,Paternal Grandparents,Mother,Siblings, Heart Disease - Father,Siblings, Hypertension - Siblings,Father,Mother, No family history of Hereditary Spherocytosis, Kidney Disease, Lung Disease, Seizures, Stroke, Thyroid Problems, Tuberculosis. Social History Never smoker, Marital Status - Single, Alcohol Use - Never, Drug Use - No History, Caffeine Use - Never. Medical History Eyes Patient has history of Cataracts Denies history of Glaucoma, Optic Neuritis Ear/Nose/Mouth/Throat Denies history of Chronic sinus problems/congestion, Middle ear problems Hematologic/Lymphatic Denies history of Anemia, Hemophilia, Human Immunodeficiency Virus, Lymphedema, Sickle Cell Disease Respiratory Denies history of Aspiration, Asthma, Chronic Obstructive Pulmonary Disease  (COPD), Pneumothorax, Sleep Apnea, Tuberculosis Cardiovascular Patient has history of Arrhythmia - Afib, Hypertension Denies history of Angina, Congestive Heart Failure, Coronary Artery Disease, Deep Vein Thrombosis, Hypotension, Myocardial Infarction, Peripheral Arterial Disease, Peripheral Venous Disease, Phlebitis, Vasculitis Gastrointestinal Denies history of Cirrhosis , Colitis, Crohn s, Hepatitis A, Hepatitis B, Hepatitis C Endocrine Patient has history of Type II Diabetes Denies history of Type I Diabetes Genitourinary Denies history of End Stage Renal Disease Immunological Denies history of Lupus Erythematosus, Raynaud s, Scleroderma Integumentary (Skin) Denies history of History of Burn, History of pressure wounds Musculoskeletal Patient has history of Osteoarthritis Denies history of Gout, Rheumatoid Arthritis, Osteomyelitis Neurologic Patient has history of Neuropathy - fingers and feet Denies history of Dementia, Quadriplegia, Paraplegia, Seizure Disorder Oncologic Denies history of Received Chemotherapy, Received Radiation Psychiatric Denies history of Anorexia/bulimia, Confinement Anxiety [name], [name] ([id number]) Medical And Surgical History Notes Cardiovascular "heart valve problem" Objective Constitutional Vitals Time Taken: 1:01 PM, Height: 66 in, Weight:  332 lbs, BMI: 53.6, Temperature: 98.2 F, Pulse: 97 bpm, Respiratory Rate: 20 breaths/min, Blood Pressure: 152/66 mmHg. General Notes: Right lower extremity: Skin tear to the anterior aspect with Granulation tissue present. No increased warmth and erythema to the lower leg. No purulent drainage. Left lower extremity: To the distal medial portion there is an open wound with granulation tissue present. Left foot: To the proximal portion of the left great toe there is a small area with Granulation tissue present. Integumentary (Hair, Skin) Wound #2 status is Open. Original cause of wound was Blister. The date  acquired was: 03/23/2021. The wound has been in treatment 1 weeks. The wound is located on the Right Lower Leg. The wound measures 3.5cm length x 10cm width x 0.1cm depth; 27.489cm^2 area and 2.749cm^3 volume. There is Fat Layer (Subcutaneous Tissue) exposed. There is no tunneling or undermining noted. There is a large amount of serous drainage noted. There is large (67-100%) red granulation within the wound bed. There is no necrotic tissue within the wound bed. Wound #3 status is Open. Original cause of wound was Gradually Appeared. The date acquired was: 02/09/2021. The wound has been in treatment 1 weeks. The wound is located on the Left Toe Great. The wound measures 0.1cm length x 0.1cm width x 0.1cm depth; 0.008cm^2 area and 0.001cm^3 volume. There is no tunneling or undermining noted. There is a none present amount of drainage noted. There is no granulation within the wound bed. There is no necrotic tissue within the wound bed. Wound #4 status is Open. Original cause of wound was Blister. The date acquired was: 03/30/2021. The wound has been in treatment 1 weeks. The wound is located on the Left Lower Leg. The wound measures 6cm length x 1.5cm width x 0.2cm depth; 7.069cm^2 area and 1.414cm^3 volume. There is Fat Layer (Subcutaneous Tissue) exposed. There is no tunneling or undermining noted. There is a medium amount of serous drainage noted. There is medium (34-66%) red granulation within the wound bed. There is a medium (34-66%) amount of necrotic tissue within the wound bed including Adherent Slough. Assessment Active Problems ICD-10 Chronic venous hypertension (idiopathic) with ulcer and inflammation of right lower extremity Chronic venous hypertension (idiopathic) with ulcer and inflammation of left lower extremity Unspecified open wound, left foot, subsequent encounter Patient's right lower extremity has improved greatly with clindamycin. I recommended she finish her antibiotics. No signs  of infection on exam. At this time I would like to put her leg under compression to help heal the wound. We will use Kerlix/Coban with Hydrofera Blue. To the left lower extremity her wounds appear well-healing. I recommended Hydrofera Blue to them as well under compression wrap. She has home health and they will change her wrap next week. She obtain her juxta lite compression wraps in the mail. She will follow-up in 2 weeks. Plan Follow-up Appointments: Return Appointment in 2 weeks. Home Health: [place]: - [place] for wound care. May utilize formulary equivalent dressing for wound treatment orders unless otherwise specified. Home Health Nurse may visit PRN to address patient s wound care needs. Scheduled days for dressing changes to be completed; exception, patient has scheduled wound care visit that day. JOBANA, GRUSZKA ([name]) **Please direct any NON-WOUND related issues/requests for orders to patient's Primary Care Physician. **If current dressing causes regression in wound condition, may D/C ordered dressing product/s and apply Normal Saline Moist Dressing daily until next [place] or Other MD appointment. **[place]  of regression in wound condition at [phone]. Bathing/ Shower/ Hygiene: May shower with wound dressing protected with water repellent cover or cast protector. No tub bath. Medications-Please add to medication list.: P.O. Antibiotics - Finish antibiotics WOUND #2: - Lower Leg Wound Laterality: Right Cleanser: Wound Cleanser 3 x Per Week/30 Days Discharge Instructions: Wash your hands with soap and water. Remove old dressing, discard into plastic bag and place into trash. Cleanse the wound with Wound Cleanser prior to applying a clean dressing using gauze sponges, not tissues or cotton balls. Do not scrub or use excessive force. Pat dry using gauze sponges, not tissue or cotton balls. Primary  Dressing: Hydrofera Blue Ready Transfer Foam, 2.5x2.5 (in/in) (Generic) 3 x Per Week/30 Days Discharge Instructions: Apply Hydrofera Blue Ready to wound bed as directed Secondary Dressing: ABD Pad 5x9 (in/in) (Generic) 3 x Per Week/30 Days Discharge Instructions: Cover with ABD pad Secondary Dressing: Kerlix 4.5 x 4.1 (in/yd) (Generic) 3 x Per Week/30 Days Discharge Instructions: Apply Kerlix from base of toes to 3-finger-widths below knee bend Secured With: Coban Cohesive Bandage 4x5 (yds) Stretched (Generic) 3 x Per Week/30 Days Discharge Instructions: Apply coban from base of toes to 3-finger-widths below knee bend WOUND #3: - Toe Great Wound Laterality: Left Primary Dressing: Hydrofera Blue Ready Transfer Foam, 2.5x2.5 (in/in) 3 x Per Week/30 Days Discharge Instructions: Apply Hydrofera Blue Ready to wound bed as directed Secondary Dressing: Coverlet Latex-Free Fabric Adhesive Dressings 3 x Per Week/30 Days Discharge Instructions: 1.5 x 2 WOUND #4: - Lower Leg Wound Laterality: Left Cleanser: Wound Cleanser 3 x Per Week/30 Days Discharge Instructions: Wash your hands with soap and water. Remove old dressing, discard into plastic bag and place into trash. Cleanse the wound with Wound Cleanser prior to applying a clean dressing using gauze sponges, not tissues or cotton balls. Do not scrub or use excessive force. Pat dry using gauze sponges, not tissue or cotton balls. Primary Dressing: Hydrofera Blue Ready Transfer Foam, 2.5x2.5 (in/in) (Generic) 3 x Per Week/30 Days Discharge Instructions: Apply Hydrofera Blue Ready to wound bed as directed Secondary Dressing: ABD Pad 5x9 (in/in) (Generic) 3 x Per Week/30 Days Discharge Instructions: Cover with ABD pad Secondary Dressing: Kerlix 4.5 x 4.1 (in/yd) (Generic) 3 x Per Week/30 Days Discharge Instructions: Apply Kerlix from base of toes to 3-finger-widths below knee bend Secured With: Coban Cohesive Bandage 4x5 (yds) Stretched (Generic) 3 x  Per Week/30 Days Discharge Instructions: Apply coban from base of toes to 3-finger-widths below knee bend 1. Hydrofera Blue under compression wrap bilaterally 2. Follow-up in 2 weeks Electronic Signature(s) Signed: 04/06/2021 2:08:43 PM By: Kalman Shan DO Entered By: Kalman Shan on 04/06/2021 14:07:59 [name] ([name]) -------------------------------------------------------------------------------- ROS/PFSH Details Patient Name: [name]. Date of Service: 04/06/2021 12:45 PM Medical Record Number: [name] Patient Account Number: [id number] Date of Birth/Sex: [birth year] (69 y.o. F) Treating RN: Dolan Amen Primary Care Provider: Rutherford Guys Other Clinician: Referring Provider: Rutherford Guys Treating Provider/Extender: Yaakov Guthrie in Treatment: 1 Information Obtained From Patient Eyes Medical History: Positive for: Cataracts Negative for: Glaucoma; Optic Neuritis Ear/Nose/Mouth/Throat Medical History: Negative for: Chronic sinus problems/congestion; Middle ear problems Hematologic/Lymphatic Medical History: Negative for: Anemia; Hemophilia; Human Immunodeficiency Virus; Lymphedema; Sickle Cell Disease Respiratory Medical History: Negative for: Aspiration; Asthma; Chronic Obstructive Pulmonary Disease (COPD); Pneumothorax; Sleep Apnea; Tuberculosis Cardiovascular Medical History: Positive for: Arrhythmia - Afib; Hypertension Negative for: Angina; Congestive Heart Failure; Coronary Artery Disease; Deep Vein Thrombosis; Hypotension; Myocardial Infarction; Peripheral Arterial Disease; Peripheral Venous Disease; Phlebitis;  Vasculitis Past Medical History Notes: "heart valve problem" Gastrointestinal Medical History: Negative for: Cirrhosis ; Colitis; Crohnos; Hepatitis A; Hepatitis B; Hepatitis C Endocrine Medical History: Positive for: Type II Diabetes Negative for: Type I Diabetes Time with diabetes: since 2007 Treated with:  Insulin, Oral agents Blood sugar tested every day: Yes Tested : daily Genitourinary Medical History: Negative for: End Stage Renal Disease Immunological Medical History: Negative for: Lupus Erythematosus; Raynaudos; Scleroderma Integumentary (Skin) [name], [name] ([name]) Medical History: Negative for: History of Burn; History of pressure wounds Musculoskeletal Medical History: Positive for: Osteoarthritis Negative for: Gout; Rheumatoid Arthritis; Osteomyelitis Neurologic Medical History: Positive for: Neuropathy - fingers and feet Negative for: Dementia; Quadriplegia; Paraplegia; Seizure Disorder Oncologic Medical History: Negative for: Received Chemotherapy; Received Radiation Psychiatric Medical History: Negative for: Anorexia/bulimia; Confinement Anxiety HBO Extended History Items Eyes: Cataracts Immunizations Pneumococcal Vaccine: Received Pneumococcal Vaccination: Yes Received Pneumococcal Vaccination On or After 60th Birthday: No Implantable Devices Yes Family and Social History Cancer: Yes - Maternal Grandparents,Paternal Grandparents,Mother,Father; Diabetes: Yes - Maternal Grandparents,Paternal Grandparents,Mother,Siblings; Heart Disease: Yes - Father,Siblings; Hereditary Spherocytosis: No; Hypertension: Yes - Siblings,Father,Mother; Kidney Disease: No; Lung Disease: No; Seizures: No; Stroke: No; Thyroid Problems: No; Tuberculosis: No; Never smoker; Marital Status - Single; Alcohol Use: Never; Drug Use: No History; Caffeine Use: Never; Financial Concerns: Yes; Food, Clothing or Shelter Needs: No; Support System Lacking: No; Transportation Concerns: No Electronic Signature(s) Signed: 04/06/2021 2:08:43 PM By: Kalman Shan DO Signed: 04/06/2021 4:18:42 PM By: Dolan Amen RN Entered By: Kalman Shan on 04/06/2021 14:04:53 [name] ([name]) -------------------------------------------------------------------------------- [place]  Details Patient Name: [name]. Date of Service: 04/06/2021 Medical Record Number: [name] Patient Account Number: [id number] Date of Birth/Sex: [birth year] (69 y.o. F) Treating RN: Dolan Amen Primary Care Provider: Rutherford Guys Other Clinician: Referring Provider: Rutherford Guys Treating Provider/Extender: Yaakov Guthrie in Treatment: 1 Diagnosis Coding ICD-10 Codes Code Description [phone] Chronic venous hypertension (idiopathic) with ulcer and inflammation of right lower extremity I87.332 Chronic venous hypertension (idiopathic) with ulcer and inflammation of left lower extremity S91.302D Unspecified open wound, left foot, subsequent encounter Facility Procedures CPT4 Code: [id number] Description: 99213 - WOUND CARE VISIT-LEV 3 EST PT Modifier: Quantity: 1 Physician Procedures CPT4 Code Description: [id number] 99213 - WC PHYS LEVEL 3 - EST PT Modifier: Quantity: 1 CPT4 Code Description: ICD-10 Diagnosis Description I87.331 Chronic venous hypertension (idiopathic) with ulcer and inflammation of I87.332 Chronic venous hypertension (idiopathic) with ulcer and inflammation of S91.302D Unspecified open wound, left  foot, subsequent encounter Modifier: right lower extremit left lower extremity Quantity: y Electronic Signature(s) Signed: 04/06/2021 2:08:43 PM By: Kalman Shan DO Entered By: Kalman Shan on 04/06/2021 14:08:23

## 2021-04-12 NOTE — Progress Notes (Addendum)
[name] ([id number]) Visit Report for 04/20/2021 Allergy List Details Patient Name: [name], [name]. Date of Service: 04/20/2021 2:45 PM Medical Record Number: [id number] Patient Account Number: [id number] Date of Birth/Sex: 1950-07-02 (70 y.o. F) Treating RN: [name] Primary Care [name]: [name] Other Clinician: Referring [name]: [name] Treating [name]/Extender: [name] in Treatment: 3 Allergies Active Allergies Niaspan Extended-Release Reaction: anaphylaxis Severity: Severe tetanus toxoid, adsorbed Severity: Severe Bactrim Severity: Moderate Januvia Severity: Moderate lisinopril Severity: Moderate Cipro Augmentin Reaction: rash Active: 04/12/2021 Allergy Notes Electronic Signature(s) Signed: 04/12/2021 8:41:58 AM By: Miranda Amen RN Entered By: [name] on 04/12/2021 08:41:58 [name] ([id number]) -------------------------------------------------------------------------------- Arrival Information Details Patient Name: [name]. Date of Service: 04/20/2021 2:45 PM Medical Record Number: [id number] Patient Account Number: [id number] Date of Birth/Sex: April 04, 1951 (70 y.o. F) Treating RN: [name] Primary Care Sidra Oldfield: [name] Other Clinician: Referring Aras Albarran: [name] Treating [name]/Extender: [name] in Treatment: 3 Visit Information Patient Arrived: [name] Time: 14:41 Transfer Assistance: None Patient Identification Verified: Yes Secondary Verification Process Completed: Yes History Since Last Visit Added or deleted any medications: No Has Compression in Place as Prescribed: Yes Electronic Signature(s) Signed: 04/20/2021 4:15:45 PM By: Miranda Cool, BSN, RN, CWS, Kim RN, BSN Entered By: [name] on 04/20/2021 14:42:52 [name] ([id number]) -------------------------------------------------------------------------------- Compression  Therapy Details Patient Name: [name]. Date of Service: 04/20/2021 2:45 PM Medical Record Number: [id number] Patient Account Number: [id number] Date of Birth/Sex: 26-Sep-1950 (70 y.o. F) Treating RN: [name] Primary Care Graig Hessling: [name] Other Clinician: Referring [name]: [name] Treating [name]/Extender: [name] in Treatment: 3 Compression Therapy Performed for Wound Assessment: Wound #5 Right,Medial Lower Leg Performed By: Clinician Miranda Barman, RN Compression Type: Three Layer Pre Treatment ABI: 1.2 Post Procedure Diagnosis Same as Pre-procedure Electronic Signature(s) Signed: 04/20/2021 4:15:45 PM By: Miranda Cool, BSN, RN, CWS, Kim RN, BSN Entered By: [name] on 04/20/2021 16:10:13 [name] ([id number]) -------------------------------------------------------------------------------- Compression Therapy Details Patient Name: [name]. Date of Service: 04/20/2021 2:45 PM Medical Record Number: [id number] Patient Account Number: [id number] Date of Birth/Sex: 29-Aug-1950 (70 y.o. F) Treating RN: [name] Primary Care Bekah Igoe: [name] Other Clinician: Referring [name]: [name] Treating [name]/Extender: [name] in Treatment: 3 Compression Therapy Performed for Wound Assessment: Wound #4 Left Lower Leg Performed By: Clinician Miranda Barman, RN Compression Type: Three Layer Pre Treatment ABI: 1.2 Post Procedure Diagnosis Same as Pre-procedure Electronic Signature(s) Signed: 04/20/2021 4:15:45 PM By: Miranda Cool, BSN, RN, CWS, Kim RN, BSN Entered By: [name] on 04/20/2021 16:10:13 [name] ([id number]) -------------------------------------------------------------------------------- Compression Therapy Details Patient Name: [name]. Date of Service: 04/20/2021 2:45 PM Medical Record Number: [id number] Patient Account Number: [id number] Date of Birth/Sex:  [birth year] (70 y.o. F) Treating RN: [name] Primary Care [name]: [name] Other Clinician: Referring [name]: [name] Treating [name]/Extender: [name] in Treatment: 3 Compression Therapy Performed for Wound Assessment: Wound #6 Left,Lateral Lower Leg Performed By: Clinician Miranda Barman, RN Compression Type: Three Layer Pre Treatment ABI: 1.2 Post Procedure Diagnosis Same as Pre-procedure Electronic Signature(s) Signed: 04/20/2021 4:15:45 PM By: Miranda Cool, BSN, RN, CWS, Kim RN, BSN Entered By: [name] on 04/20/2021 16:10:13 [name] ([id number]) -------------------------------------------------------------------------------- Encounter Discharge Information Details Patient Name: [name]. Date of Service: 04/20/2021 2:45 PM Medical Record Number: [id number] Patient Account Number: [id number] Date of Birth/Sex: 1950/12/19 (70 y.o. F) Treating  RN: [name] Primary Care [name]: [name] Other Clinician: Referring [name]: [name] Treating [name]/Extender: [name] in Treatment: 3 Encounter Discharge Information Items Discharge Condition: Stable Ambulatory Status: Ambulatory Discharge Destination: Home Transportation: Private Auto Schedule Follow-up Appointment: Yes Clinical Summary of Care: Electronic Signature(s) Signed: 04/20/2021 4:15:45 PM By: Miranda Cool, BSN, RN, CWS, Kim RN, BSN Entered By: [name] on 04/20/2021 16:13:52 [name] ([id number]) -------------------------------------------------------------------------------- Lower Extremity Assessment Details Patient Name: [name], [name]. Date of Service: 04/20/2021 2:45 PM Medical Record Number: [id number] Patient Account Number: [id number] Date of Birth/Sex: 07-11-1950 (70 y.o. F) Treating RN: [name] Primary Care Tylon Kemmerling: [name] Other Clinician: Referring [name]: [name] Treating  [name]/Extender: [name] in Treatment: 3 Edema Assessment Assessed: [Left: No] [Right: No] [Left: Edema] [Right: :] Calf Left: Right: Point of Measurement: 29 cm From Medial Instep 45 cm 49.5 cm Ankle Left: Right: Point of Measurement: 10 cm From Medial Instep 21.5 cm 22 cm Vascular Assessment Pulses: Dorsalis Pedis Palpable: [Left:Yes] [Right:Yes] Doppler Audible: [Left:Yes] [Right:Yes] Posterior Tibial Palpable: [Left:No] [Right:No] Doppler Audible: [Left:Inaudible] [Right:Yes] Blood Pressure: Brachial: [Right:160] Dorsalis Pedis: 180 Ankle: [Left:Dorsalis Pedis: 189] Posterior Tibial: 188 Ankle Brachial Index: [Left:1.18] [Right:1.18] Electronic Signature(s) Signed: 04/20/2021 4:15:45 PM By: Miranda Cool, BSN, RN, CWS, Kim RN, BSN Entered By: [name] on 04/20/2021 15:47:43 [name] ([id number]) -------------------------------------------------------------------------------- Multi Wound Chart Details Patient Name: [name]. Date of Service: 04/20/2021 2:45 PM Medical Record Number: [id number] Patient Account Number: [id number] Date of Birth/Sex: 05/04/1951 (70 y.o. 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 Comorbid History: Cataracts, Arrhythmia, Cataracts, Arrhythmia, N/A Hypertension, Type II Diabetes, Hypertension, Type II Diabetes, Osteoarthritis, Neuropathy Osteoarthritis, Neuropathy Date Acquired:  03/23/2021 02/09/2021 03/30/2021 Weeks of Treatment: '3 3 3 '$ Wound Status: Open Open Open Clustered Wound: No No Yes Measurements L x W x D (cm) 2x9x0.1 0.1x0.1x0.1 6x1.5x0.2 Area (cm) : 14.137 0.008 7.069 Volume (cm) : 1.414 0.001 1.414 % Reduction in Area: 42.90% 91.50% -200.00% % Reduction in Volume: 71.40% 88.90% -200.20% Classification: Full Thickness Without Exposed Grade 1 Full Thickness Without Exposed Support Structures Support Structures Exudate Amount: Large None Present Medium Exudate Type: Serous N/A Serous Exudate Color: amber N/A amber Wound Margin: N/A N/A N/A Granulation Amount: Large (67-100%) None Present (0%) N/A Granulation Quality: Red N/A N/A Necrotic Amount: None Present (0%) None Present (0%) N/A Exposed Structures: Fat Layer (Subcutaneous Tissue): Fascia: No N/A Yes Fat Layer (Subcutaneous Tissue): Fascia: No No Tendon: No Tendon: No Muscle: No Muscle: No Joint: No Joint: No Bone: No Bone: No Epithelialization: None None N/A Procedures Performed: N/A N/A Compression Therapy Wound Number: '5 6 7 '$ Photos: [name], [name] ([id number]) Wound Location: Right, Medial Lower Leg Left, Lateral Lower Leg Left, Dorsal Foot Wounding Event: Gradually Appeared Gradually Appeared Gradually Appeared 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 Comorbid History: N/A Cataracts, Arrhythmia, Cataracts, Arrhythmia, Hypertension, Type II Diabetes, Hypertension, Type II Diabetes, Osteoarthritis, Neuropathy Osteoarthritis, Neuropathy Date Acquired: 04/18/2021 04/11/2021 04/18/2021 Weeks of Treatment: 0 0 0 Wound Status: Open  Open Open Clustered Wound: No No No Measurements L x W x D (cm) 1x0.9x0.2 0.5x0.5x0.1 0.3x0.4x0.1 Area (cm) : 0.707 0.196 0.094 Volume (cm) : 0.141 0.02 0.009 % Reduction in Area: N/A 0.00% N/A % Reduction in Volume: N/A 0.00% N/A Classification: N/A Partial Thickness Grade 1 Exudate Amount:  N/A Large Medium Exudate Type: N/A Serous Serous Exudate Color: N/A amber amber Wound Margin: N/A N/A Flat and Intact Granulation Amount: N/A Large (67-100%) None Present (0%) Granulation Quality: N/A N/A N/A Necrotic Amount: N/A None Present (0%) None Present (0%) Exposed Structures: N/A Fat Layer (Subcutaneous Tissue): Fat Layer (Subcutaneous Tissue): Yes Yes Fascia: No Tendon: No Muscle: No Joint: No Bone: No Epithelialization: N/A N/A None Procedures Performed: Compression Therapy Compression Therapy N/A Treatment Notes Electronic Signature(s) Signed: 04/20/2021 4:22:04 PM By: Kalman Shan DO Entered By: Kalman Shan on 04/20/2021 16:11:39 [name] ([id number]) -------------------------------------------------------------------------------- [place] Details Patient Name: [name]. Date of Service: 04/20/2021 2:45 PM Medical Record Number: [id number] Patient Account Number: [id number] Date of Birth/Sex: [birth year] (70 y.o. F) Treating RN: [name] Primary Care Indira Sorenson: [name] Other Clinician: Referring Cal Gindlesperger: [name] Treating Orland Visconti/Extender: [name] in Treatment: 3 Active Inactive Wound/Skin Impairment Nursing Diagnoses: Impaired tissue integrity Goals: Patient/caregiver will verbalize understanding of skin care regimen Date Initiated: 03/30/2021 Target Resolution Date: 03/30/2021 Goal Status: Active Ulcer/skin breakdown will have a volume reduction of 30% by week 4 Date Initiated: 03/30/2021 Target Resolution Date: 04/30/2021 Goal Status: Active Ulcer/skin breakdown will have a volume reduction of 50% by week 8 Date Initiated: 03/30/2021 Target Resolution Date: 05/30/2021 Goal Status: Active Ulcer/skin breakdown will have a volume reduction of 80% by week 12 Date Initiated: 03/30/2021 Target Resolution Date: 06/30/2021 Goal Status: Active Ulcer/skin breakdown will heal within 14 weeks Date  Initiated: 03/30/2021 Target Resolution Date: 07/31/2021 Goal Status: Active Interventions: Assess patient/caregiver ability to obtain necessary supplies Assess patient/caregiver ability to perform ulcer/skin care regimen upon admission and as needed Assess ulceration(s) every visit Treatment Activities: Patient referred to home care : 03/30/2021 Referred to DME Mariano Doshi for dressing supplies : 03/30/2021 Skin care regimen initiated : 03/30/2021 Notes: Electronic Signature(s) Signed: 04/20/2021 4:15:45 PM By: Miranda Cool, BSN, RN, CWS, Kim RN, BSN Entered By: [name] on 04/20/2021 16:09:43 [name] ([id number]) -------------------------------------------------------------------------------- Pain Assessment Details Patient Name: [name]. Date of Service: 04/20/2021 2:45 PM Medical Record Number: [id number] Patient Account Number: [id number] Date of Birth/Sex: 23-Dec-1950 (70 y.o. F) Treating RN: [name] Primary Care Ondrea Dow: [name] Other Clinician: Referring Trung Wenzl: [name] Treating Anavictoria Wilk/Extender: [name] in Treatment: 3 Active Problems Location of Pain Severity and Description of Pain Patient Has Paino No Site Locations Pain Management and Medication Current Pain Management: Electronic Signature(s) Signed: 04/20/2021 4:15:45 PM By: Miranda Cool, BSN, RN, CWS, Kim RN, BSN Entered By: [name] on 04/20/2021 14:46:52 [name] ([id number]) -------------------------------------------------------------------------------- Patient/Caregiver Education Details Patient Name: [name] Date of Service: 04/20/2021 2:45 PM Medical Record Number: [id number] Patient Account Number: [id number] Date of Birth/Gender: [birth year] (70 y.o. F) Treating RN: [name] Primary Care Physician: [name] Other Clinician: Referring Physician: Rutherford [name] Treating Physician/Extender: [name] in Treatment:  3 Education Assessment Education Provided To: Patient Education Topics Provided Venous: Handouts: Controlling Swelling with Multilayered Compression Wraps Methods: Demonstration, Explain/Verbal Responses: State content correctly Electronic Signature(s) Signed: 04/20/2021 4:15:45 PM By: Miranda Cool, BSN, RN, CWS, Kim RN, BSN Entered By: [name] on 04/20/2021  16:13:11 [name], [name] ([id number]) -------------------------------------------------------------------------------- Wound Assessment Details Patient Name: [name], [name]. Date of Service: 04/20/2021 2:45 PM Medical Record Number: [id number] Patient Account Number: [id number] Date of Birth/Sex: 1950-11-20 (70 y.o. F) Treating RN: [name] Primary Care Aleaha Fickling: [name] Other Clinician: Referring Yassine Brunsman: [name] Treating Marigold Mom/Extender: [name] in Treatment: 3 Wound Status Wound Number: 2 Primary Venous Leg Ulcer Etiology: Wound Location: Right Lower Leg Wound Status: Open Wounding Event: Blister Comorbid Cataracts, Arrhythmia, Hypertension, Type II Diabetes, Date Acquired: 03/23/2021 History: Osteoarthritis, Neuropathy Weeks Of Treatment: 3 Clustered Wound: No Photos Wound Measurements Length: (cm) 2 Width: (cm) 9 Depth: (cm) 0.1 Area: (cm) 14.137 Volume: (cm) 1.414 % Reduction in Area: 42.9% % Reduction in Volume: 71.4% Epithelialization: None Wound Description Classification: Full Thickness Without Exposed Support Structures Exudate Amount: Large Exudate Type: Serous Exudate Color: amber Foul Odor After Cleansing: No Slough/Fibrino No Wound Bed Granulation Amount: Large (67-100%) Exposed Structure Granulation Quality: Red Fascia Exposed: No Necrotic Amount: None Present (0%) Fat Layer (Subcutaneous Tissue) Exposed: Yes Tendon Exposed: No Muscle Exposed: No Joint Exposed: No Bone Exposed: No Treatment Notes Wound #2 (Lower Leg) Wound Laterality:  Right Cleanser Wound Cleanser Discharge Instruction: Wash your hands with soap and water. Remove old dressing, discard into plastic bag and place into trash. Cleanse the wound with Wound Cleanser prior to applying a clean dressing using gauze sponges, not tissues or cotton balls. Do not scrub or use excessive force. Pat dry using gauze sponges, not tissue or cotton balls. THEMA, HOLSON ([id number]) Peri-Wound Care Topical Primary Dressing Hydrofera Blue Ready Transfer Foam, 2.5x2.5 (in/in) Discharge Instruction: Apply Hydrofera Blue Ready to wound bed as directed Secondary Dressing Xtrasorb Large 6x9 (in/in) Discharge Instruction: Apply to wound as directed. Do not cut. Secured With Compression Wrap Profore Lite LF 3 Multilayer Compression Bandaging System Discharge Instruction: Apply 3 multi-layer wrap as prescribed. Compression Stockings Environmental education officer) Signed: 04/20/2021 4:15:45 PM By: Miranda Cool, BSN, RN, CWS, Kim RN, BSN Entered By: [name] on 04/20/2021 15:12:03 [name] ([id number]) -------------------------------------------------------------------------------- Wound Assessment Details Patient Name: [name], [name]. Date of Service: 04/20/2021 2:45 PM Medical Record Number: [id number] Patient Account Number: [id number] Date of Birth/Sex: Jan 26, 1951 (71 y.o. F) Treating RN: [name] Primary Care Ara Grandmaison: [name] Other Clinician: Referring Velena Keegan: [name] Treating Boston Cookson/Extender: [name] in Treatment: 3 Wound Status Wound Number: 3 Primary Diabetic Wound/Ulcer of the Lower Extremity Etiology: Wound Location: Left Toe Great Wound Status: Open Wounding Event: Gradually Appeared Comorbid Cataracts, Arrhythmia, Hypertension, Type II Diabetes, Date Acquired: 02/09/2021 History: Osteoarthritis, Neuropathy Weeks Of Treatment: 3 Clustered Wound: No Photos Wound Measurements Length: (cm) 0.1 Width: (cm)  0.1 Depth: (cm) 0.1 Area: (cm) 0.008 Volume: (cm) 0.001 % Reduction in Area: 91.5% % Reduction in Volume: 88.9% Epithelialization: None Wound Description Classification: Grade 1 Exudate Amount: None Present Foul Odor After Cleansing: No Slough/Fibrino No Wound Bed Granulation Amount: None Present (0%) Exposed Structure Necrotic Amount: None Present (0%) Fascia Exposed: No Fat Layer (Subcutaneous Tissue) Exposed: No Tendon Exposed: No Muscle Exposed: No Joint Exposed: No Bone Exposed: No Treatment Notes Wound #3 (Toe Great) Wound Laterality: Left Cleanser Peri-Wound Care Topical Primary Dressing Hydrofera Blue Ready Transfer Foam, 2.5x2.5 (in/in) [name], [name] ([id number]) Discharge Instruction: Apply Hydrofera Blue Ready to wound bed as directed Secondary Dressing Coverlet Latex-Free Fabric Adhesive Dressings Discharge Instruction: 1.5 x 2 Secured With Compression Wrap Compression Stockings Add-Ons Electronic Signature(s) Signed: 04/20/2021 4:15:45 PM By: Miranda Cool, BSN,  RN, CWS, Kim RN, BSN Entered By: [name] on 04/20/2021 15:12:35 [name] ([id number]) -------------------------------------------------------------------------------- Wound Assessment Details Patient Name: [name]. Date of Service: 04/20/2021 2:45 PM Medical Record Number: [id number] Patient Account Number: [id number] Date of Birth/Sex: 23-Sep-1950 (70 y.o. F) Treating RN: [name] Primary Care Hebah Bogosian: [name] Other Clinician: Referring Sheri Gatchel: [name] Treating Aws Shere/Extender: [name] in Treatment: 3 Wound Status Wound Number: 4 Primary Etiology: Venous Leg Ulcer Wound Location: Left Lower Leg Wound Status: Open Wounding Event: Blister Date Acquired: 03/30/2021 Weeks Of Treatment: 3 Clustered Wound: Yes Photos Photo Uploaded By: [name] on 04/20/2021 15:09:33 Wound Measurements Length: (cm) 6 Width: (cm)  1.5 Depth: (cm) 0.2 Area: (cm) 7.069 Volume: (cm) 1.414 % Reduction in Area: -200% % Reduction in Volume: -200.2% Wound Description Classification: Full Thickness Without Exposed Support Structu Exudate Amount: Medium Exudate Type: Serous Exudate Color: amber res Treatment Notes Wound #4 (Lower Leg) Wound Laterality: Left Cleanser Wound Cleanser Discharge Instruction: Wash your hands with soap and water. Remove old dressing, discard into plastic bag and place into trash. Cleanse the wound with Wound Cleanser prior to applying a clean dressing using gauze sponges, not tissues or cotton balls. Do not scrub or use excessive force. Pat dry using gauze sponges, not tissue or cotton balls. Peri-Wound Care Topical Primary Dressing Hydrofera Blue Ready Transfer Foam, 2.5x2.5 (in/in) Discharge Instruction: Apply Hydrofera Blue Ready to wound bed as directed Secondary Dressing [name], [name] ([id number]) Xtrasorb Large 6x9 (in/in) Discharge Instruction: Apply to wound as directed. Do not cut. Secured With Compression Wrap Profore Lite LF 3 Multilayer Compression Bandaging System Discharge Instruction: Apply 3 multi-layer wrap as prescribed. Compression Stockings Environmental education officer) Signed: 04/20/2021 4:15:45 PM By: Miranda Cool, BSN, RN, CWS, Kim RN, BSN Entered By: [name] on 04/20/2021 15:04:59 [name] ([id number]) -------------------------------------------------------------------------------- Wound Assessment Details Patient Name: [name], [name]. Date of Service: 04/20/2021 2:45 PM Medical Record Number: [id number] Patient Account Number: [id number] Date of Birth/Sex: January 05, 1951 (70 y.o. F) Treating RN: [name] Primary Care Marbin Olshefski: [name] Other Clinician: Referring Takeo Harts: [name] Treating Retina Bernardy/Extender: [name] in Treatment: 3 Wound Status Wound Number: 5 Primary Etiology: Venous Leg Ulcer Wound  Location: Right, Medial Lower Leg Wound Status: Open Wounding Event: Gradually Appeared Date Acquired: 04/18/2021 Weeks Of Treatment: 0 Clustered Wound: No Photos Photo Uploaded By: [name] on 04/20/2021 15:09:34 Wound Measurements Length: (cm) Width: (cm) Depth: (cm) Area: (cm) Volume: (cm) 1 % Reduction in Area: 0.9 % Reduction in Volume: 0.2 0.707 0.141 Treatment Notes Wound #5 (Lower Leg) Wound Laterality: Right, Medial Cleanser Wound Cleanser Discharge Instruction: Wash your hands with soap and water. Remove old dressing, discard into plastic bag and place into trash. Cleanse the wound with Wound Cleanser prior to applying a clean dressing using gauze sponges, not tissues or cotton balls. Do not scrub or use excessive force. Pat dry using gauze sponges, not tissue or cotton balls. Peri-Wound Care Topical Primary Dressing Hydrofera Blue Ready Transfer Foam, 2.5x2.5 (in/in) Discharge Instruction: Apply Hydrofera Blue Ready to wound bed as directed Secondary Dressing Xtrasorb Large 6x9 (in/in) Discharge Instruction: Apply to wound as directed. Do not cut. Secured With [employer] Profore Lite LF 3 Multilayer Compression Bandaging System [name], [name] ([id number]) Discharge Instruction: Apply 3 multi-layer wrap as prescribed. Compression Stockings Environmental education officer) Signed: 04/20/2021 4:15:45 PM By: Miranda Cool, BSN, RN, CWS, Kim RN, BSN Entered  By: [name] on 04/20/2021 15:05:38 [name] ([id number]) -------------------------------------------------------------------------------- Wound Assessment Details Patient Name: [name]. Date of Service: 04/20/2021 2:45 PM Medical Record Number: [id number] Patient Account Number: [id number] Date of Birth/Sex: 05/19/1951 (70 y.o. F) Treating RN: [name] Primary Care [name] Wickliffe: [name] Other Clinician: Referring Tresten Pantoja: [name] Treating  Infiniti Hoefling/Extender: [name] in Treatment: 3 Wound Status Wound Number: 6 Primary Venous Leg Ulcer Etiology: Wound Location: Left, Lateral Lower Leg Wound Status: Open Wounding Event: Gradually Appeared Comorbid Cataracts, Arrhythmia, Hypertension, Type II Diabetes, Date Acquired: 04/11/2021 History: Osteoarthritis, Neuropathy Weeks Of Treatment: 0 Clustered Wound: No Photos Wound Measurements Length: (cm) 0.5 Width: (cm) 0.5 Depth: (cm) 0.1 Area: (cm) 0.196 Volume: (cm) 0.02 % Reduction in Area: 0% % Reduction in Volume: 0% Wound Description Classification: Partial Thickness Exudate Amount: Large Exudate Type: Serous Exudate Color: amber Foul Odor After Cleansing: No Slough/Fibrino No Wound Bed Granulation Amount: Large (67-100%) Exposed Structure Necrotic Amount: None Present (0%) Fat Layer (Subcutaneous Tissue) Exposed: Yes Treatment Notes Wound #6 (Lower Leg) Wound Laterality: Left, Lateral Cleanser Wound Cleanser Discharge Instruction: Wash your hands with soap and water. Remove old dressing, discard into plastic bag and place into trash. Cleanse the wound with Wound Cleanser prior to applying a clean dressing using gauze sponges, not tissues or cotton balls. Do not scrub or use excessive force. Pat dry using gauze sponges, not tissue or cotton balls. Peri-Wound Care Topical Primary Dressing [address] Ready Transfer Foam, 2.5x2.5 (in/in) [name], [name] ([id number]) Discharge Instruction: Apply Hydrofera Blue Ready to wound bed as directed Secondary Dressing Xtrasorb Large 6x9 (in/in) Discharge Instruction: Apply to wound as directed. Do not cut. Secured With Compression Wrap Profore Lite LF 3 Multilayer Compression Bandaging System Discharge Instruction: Apply 3 multi-layer wrap as prescribed. Compression Stockings Environmental education officer) Signed: 04/20/2021 4:15:45 PM By: Miranda Cool, BSN, RN, CWS, Kim RN, BSN Entered By: Miranda Cool,  BSN, RN, CWS, [name] on 04/20/2021 15:14:19 [name] ([id number]) -------------------------------------------------------------------------------- Wound Assessment Details Patient Name: [name], RATKOVICH. Date of Service: 04/20/2021 2:45 PM Medical Record Number: [id number] Patient Account Number: [id number] Date of Birth/Sex: 12-09-1950 (70 y.o. F) Treating RN: [name] Primary Care Ivah Girardot: [name] Other Clinician: Referring Neli Fofana: [name] Treating Juliany Daughety/Extender: [name] in Treatment: 3 Wound Status Wound Number: 7 Primary Diabetic Wound/Ulcer of the Lower Extremity Etiology: Wound Location: Left, Dorsal Foot Wound Status: Open Wounding Event: Gradually Appeared Comorbid Cataracts, Arrhythmia, Hypertension, Type II Diabetes, Date Acquired: 04/18/2021 History: Osteoarthritis, Neuropathy Weeks Of Treatment: 0 Clustered Wound: No Photos Wound Measurements Length: (cm) 0.3 Width: (cm) 0.4 Depth: (cm) 0.1 Area: (cm) 0.094 Volume: (cm) 0.009 % Reduction in Area: % Reduction in Volume: Epithelialization: None Tunneling: No Undermining: No Wound Description Classification: Grade 1 Wound Margin: Flat and Intact Exudate Amount: Medium Exudate Type: Serous Exudate Color: amber Foul Odor After Cleansing: No Slough/Fibrino No Wound Bed Granulation Amount: None Present (0%) Exposed Structure Necrotic Amount: None Present (0%) Fascia Exposed: No Fat Layer (Subcutaneous Tissue) Exposed: Yes Tendon Exposed: No Muscle Exposed: No Joint Exposed: No Bone Exposed: No Treatment Notes Wound #7 (Foot) Wound Laterality: Dorsal, Left Cleanser Wound Cleanser Discharge Instruction: Wash your hands with soap and water. Remove old dressing, discard into plastic bag and place into trash. Cleanse the wound with Wound Cleanser prior to applying a clean dressing using gauze sponges, not tissues or cotton balls. Do not scrub or use excessive force. Pat  dry using gauze sponges, not  tissue or cotton balls. ANNALEIA, LEVENGOOD ([id number]) Peri-Wound Care Topical Primary Dressing Hydrofera Blue Ready Transfer Foam, 2.5x2.5 (in/in) Discharge Instruction: Apply Hydrofera Blue Ready to wound bed as directed Secondary Dressing Xtrasorb Large 6x9 (in/in) Discharge Instruction: Apply to wound as directed. Do not cut. Secured With Compression Wrap Profore Lite LF 3 Multilayer Compression Bandaging System Discharge Instruction: Apply 3 multi-layer wrap as prescribed. Compression Stockings Environmental education officer) Signed: 04/20/2021 4:15:45 PM By: Miranda Cool, BSN, RN, CWS, Kim RN, BSN Entered By: [name] on 04/20/2021 15:07:55 [name] ([id number]) -------------------------------------------------------------------------------- [facility] Details Patient Name: [name]. Date of Service: 04/20/2021 2:45 PM Medical Record Number: [id number] Patient Account Number: [id number] Date of Birth/Sex: [birth year] (70 y.o. F) Treating RN: [name] Primary Care Coriana Angello: [name] Other Clinician: Referring Montie Gelardi: [name] Treating Sorayah Schrodt/Extender: [name] in Treatment: 3 Vital Signs Time Taken: 14:42 Temperature (F): 97.6 Height (in): 66 Pulse (bpm): 93 Weight (lbs): 332 Respiratory Rate (breaths/min): 20 Body Mass Index (BMI): 53.6 Blood Pressure (mmHg): 165/84 Reference Range: 80 - 120 mg / dl Airway Pulse Oximetry (%): 93 Notes Patient takes medication for Blood Pressure. She states she hasn't taken it for the second time today. Patient o2 sats at 93% on room air. Electronic Signature(s) Signed: 04/20/2021 4:15:45 PM By: Miranda Cool, BSN, RN, CWS, Kim RN, BSN Entered By: [name] on 04/20/2021 14:45:58

## 2021-04-13 ENCOUNTER — Ambulatory Visit: Payer: Medicare Other | Admitting: Internal Medicine

## 2021-04-20 ENCOUNTER — Encounter: Payer: Medicare Other | Admitting: Internal Medicine

## 2021-04-20 ENCOUNTER — Other Ambulatory Visit: Payer: Self-pay

## 2021-04-20 DIAGNOSIS — I87331 Chronic venous hypertension (idiopathic) with ulcer and inflammation of right lower extremity: Secondary | ICD-10-CM | POA: Diagnosis not present

## 2021-04-20 NOTE — Progress Notes (Signed)
TERENCE, GOOGE ([id number]) Visit Report for 04/20/2021 Chief Complaint Document Details Patient Name: [name], [name]. Date of Service: 04/20/2021 2:45 PM Medical Record Number: [id number] Patient Account Number: [id number] Date of Birth/Sex: 03-Mar-1951 (70 y.o. F) Treating RN: Cornell Barman Primary Care Provider: Rutherford Guys Other Clinician: Referring Provider: Rutherford Guys Treating Provider/Extender: Yaakov Guthrie in Treatment: 3 Information Obtained from: Patient Chief Complaint Bilateral lower extremity wounds Electronic Signature(s) Signed: 04/20/2021 4:22:04 PM By: Kalman Shan DO Entered By: Kalman Shan on 04/20/2021 16:11:52 [name] ([id number]) -------------------------------------------------------------------------------- HPI Details Patient Name: [name]. Date of Service: 04/20/2021 2:45 PM Medical Record Number: [id number] Patient Account Number: [id number] Date of Birth/Sex: Oct 30, 1950 (70 y.o. F) Treating RN: Cornell Barman Primary Care Provider: Rutherford Guys Other Clinician: Referring Provider: Rutherford Guys Treating Provider/Extender: Yaakov Guthrie in Treatment: 3 History of Present Illness HPI Description: Admission 10/5 Ms. Maryan Sivak is a 70 year old female with a past medical history of hypertension, venous insufficiency, chronic diastolic heart failure and insulin-dependent type 2 diabetes that presents To the clinic for bilateral lower extremity wounds. She states that her right lower extremity developed a blister 1 week ago and she has been keeping the area covered with a bandaid. She states that when the Band-Aid came off it tore her skin. She subsequently developed pain, increased warmth and erythema to the right lower extremity. She denies purulent drainage. She has also developed an open wound to the left lower extremity that recently developed. She also has a small open wound to the proximal portion of the  left great toe. This has been present since August and was evaluated by podiatry. It has not healed and she has not followed up with podiatry for this issue. 10/12; patient presents for follow-up. She states she has 2 days left on her antibiotics. She is tolerated this well. She reports improvement to her right lower extremity symptoms. She denies signs of infection. She has home health. 10/26; patient presents for follow-up. She has no issues or complaints today. The compression wraps slid down to her shin. These were placed on Monday by home health. She has a couple of new open wounds due to this. She denies signs of infection. Electronic Signature(s) Signed: 04/20/2021 4:22:04 PM By: Kalman Shan DO Entered By: Kalman Shan on 04/20/2021 16:13:07 [name] ([id number]) -------------------------------------------------------------------------------- Physical Exam Details Patient Name: [name]. Date of Service: 04/20/2021 2:45 PM Medical Record Number: [id number] Patient Account Number: [id number] Date of Birth/Sex: Dec 07, 1950 (70 y.o. F) Treating RN: Cornell Barman Primary Care Provider: Rutherford Guys Other Clinician: Referring Provider: Rutherford Guys Treating Provider/Extender: Yaakov Guthrie in Treatment: 3 Constitutional . Cardiovascular . Psychiatric . Notes Right lower extremity: Scattered open wounds with granulation tissue present. No signs of infection. Left lower extremity: To the left lateral leg and dorsal left foot there are open wounds limited to skin breakdown. No signs of infection. Weeping on exam bilaterally. Electronic Signature(s) Signed: 04/20/2021 4:22:04 PM By: Kalman Shan DO Entered By: Kalman Shan on 04/20/2021 16:18:53 [name] ([id number]) -------------------------------------------------------------------------------- Physician Orders Details Patient Name: [name]. Date of Service: 04/20/2021 2:45  PM Medical Record Number: [id number] Patient Account Number: [id number] Date of Birth/Sex: 1951-03-06 (70 y.o. F) Treating RN: Cornell Barman Primary Care Provider: Rutherford Guys Other Clinician: Referring Provider: Rutherford Guys Treating Provider/Extender: Yaakov Guthrie in Treatment: 3 Verbal / Phone Orders: No Diagnosis Coding Follow-up Appointments o Return Appointment in 2 weeks. [place]: -  [place] for wound care. May utilize formulary equivalent dressing for wound treatment orders unless otherwise specified. Home Health Nurse may visit PRN to address patientos wound care needs. o Scheduled days for dressing changes to be completed; exception, patient has scheduled wound care visit that day. o **Please direct any NON-WOUND related issues/requests for orders to patient's Primary Care Physician. **If current dressing causes regression in wound condition, may D/C ordered dressing product/s and apply Normal Saline Moist Dressing daily until next [place] or Other MD appointment. **Notify [facility] of regression in wound condition at [phone]. Bathing/ Shower/ Hygiene o May shower with wound dressing protected with water repellent cover or cast protector. o No tub bath. Wound Treatment Wound #2 - Lower Leg Wound Laterality: Right Cleanser: Wound Cleanser 3 x Per Week/30 Days Discharge Instructions: Wash your hands with soap and water. Remove old dressing, discard into plastic bag and place into trash. Cleanse the wound with Wound Cleanser prior to applying a clean dressing using gauze sponges, not tissues or cotton balls. Do not scrub or use excessive force. Pat dry using gauze sponges, not tissue or cotton balls. Primary Dressing: Hydrofera Blue Ready Transfer Foam, 2.5x2.5 (in/in) (Generic) 3 x Per Week/30 Days Discharge Instructions: Apply Hydrofera Blue Ready to wound bed as  directed Secondary Dressing: Xtrasorb Large 6x9 (in/in) (Generic) 3 x Per Week/30 Days Discharge Instructions: Apply to wound as directed. Do not cut. Compression Wrap: Profore Lite LF 3 Multilayer Compression Bandaging System (Generic) 3 x Per Week/30 Days Discharge Instructions: Apply 3 multi-layer wrap as prescribed. Wound #3 - Toe Great Wound Laterality: Left Primary Dressing: Hydrofera Blue Ready Transfer Foam, 2.5x2.5 (in/in) 3 x Per Week/30 Days Discharge Instructions: Apply Hydrofera Blue Ready to wound bed as directed Secondary Dressing: Coverlet Latex-Free Fabric Adhesive Dressings 3 x Per Week/30 Days Discharge Instructions: 1.5 x 2 Wound #4 - Lower Leg Wound Laterality: Left Cleanser: Wound Cleanser 3 x Per Week/30 Days Discharge Instructions: Wash your hands with soap and water. Remove old dressing, discard into plastic bag and place into trash. Cleanse the wound with Wound Cleanser prior to applying a clean dressing using gauze sponges, not tissues or cotton balls. Do not scrub or use excessive force. Pat dry using gauze sponges, not tissue or cotton balls. Primary Dressing: Hydrofera Blue Ready Transfer Foam, 2.5x2.5 (in/in) (Generic) 3 x Per Week/30 Days Discharge Instructions: Apply Hydrofera Blue Ready to wound bed as directed Secondary Dressing: Xtrasorb Large 6x9 (in/in) (Generic) 3 x Per Week/30 Days Discharge Instructions: Apply to wound as directed. Do not cut. Compression Wrap: Profore Lite LF 3 Multilayer Compression Bandaging System (Generic) 3 x Per Week/30 Days Discharge Instructions: Apply 3 multi-layer wrap as prescribed. KA, BENCH ([id number]) Wound #5 - Lower Leg Wound Laterality: Right, Medial Cleanser: Wound Cleanser 3 x Per Week/30 Days Discharge Instructions: Wash your hands with soap and water. Remove old dressing, discard into plastic bag and place into trash. Cleanse the wound with Wound Cleanser prior to applying a clean dressing using gauze  sponges, not tissues or cotton balls. Do not scrub or use excessive force. Pat dry using gauze sponges, not tissue or cotton balls. Primary Dressing: Hydrofera Blue Ready Transfer Foam, 2.5x2.5 (in/in) (Generic) 3 x Per Week/30 Days Discharge Instructions: Apply Hydrofera Blue Ready to wound bed as directed Secondary Dressing: Xtrasorb Large 6x9 (in/in) (Generic) 3 x Per Week/30 Days Discharge Instructions: Apply to wound as directed. Do not cut. Compression Wrap: Profore Lite LF  3 Multilayer Compression Bandaging System (Generic) 3 x Per Week/30 Days Discharge Instructions: Apply 3 multi-layer wrap as prescribed. Wound #6 - Lower Leg Wound Laterality: Left, Lateral Cleanser: Wound Cleanser 3 x Per Week/30 Days Discharge Instructions: Wash your hands with soap and water. Remove old dressing, discard into plastic bag and place into trash. Cleanse the wound with Wound Cleanser prior to applying a clean dressing using gauze sponges, not tissues or cotton balls. Do not scrub or use excessive force. Pat dry using gauze sponges, not tissue or cotton balls. Primary Dressing: Hydrofera Blue Ready Transfer Foam, 2.5x2.5 (in/in) (Generic) 3 x Per Week/30 Days Discharge Instructions: Apply Hydrofera Blue Ready to wound bed as directed Secondary Dressing: Xtrasorb Large 6x9 (in/in) (Generic) 3 x Per Week/30 Days Discharge Instructions: Apply to wound as directed. Do not cut. Compression Wrap: Profore Lite LF 3 Multilayer Compression Bandaging System (Generic) 3 x Per Week/30 Days Discharge Instructions: Apply 3 multi-layer wrap as prescribed. Wound #7 - Foot Wound Laterality: Dorsal, Left Cleanser: Wound Cleanser 3 x Per Week/30 Days Discharge Instructions: Wash your hands with soap and water. Remove old dressing, discard into plastic bag and place into trash. Cleanse the wound with Wound Cleanser prior to applying a clean dressing using gauze sponges, not tissues or cotton balls. Do not scrub or use  excessive force. Pat dry using gauze sponges, not tissue or cotton balls. Primary Dressing: Hydrofera Blue Ready Transfer Foam, 2.5x2.5 (in/in) (Generic) 3 x Per Week/30 Days Discharge Instructions: Apply Hydrofera Blue Ready to wound bed as directed Secondary Dressing: Xtrasorb Large 6x9 (in/in) (Generic) 3 x Per Week/30 Days Discharge Instructions: Apply to wound as directed. Do not cut. Compression Wrap: Profore Lite LF 3 Multilayer Compression Bandaging System (Generic) 3 x Per Week/30 Days Discharge Instructions: Apply 3 multi-layer wrap as prescribed. Electronic Signature(s) Signed: 04/20/2021 4:15:45 PM By: Gretta Cool, BSN, RN, CWS, Kim RN, BSN Signed: 04/20/2021 4:22:04 PM By: Kalman Shan DO Entered By: Gretta Cool BSN, RN, CWS, Kim on 04/20/2021 16:11:49 [name] ([id number]) -------------------------------------------------------------------------------- Problem List Details Patient Name: [name], [name]. Date of Service: 04/20/2021 2:45 PM Medical Record Number: [id number] Patient Account Number: [id number] Date of Birth/Sex: 01-20-1951 (70 y.o. F) Treating RN: Cornell Barman Primary Care Provider: Rutherford Guys Other Clinician: Referring Provider: Rutherford Guys Treating Provider/Extender: Yaakov Guthrie in Treatment: 3 Active Problems ICD-10 Encounter Code Description Active Date MDM Diagnosis I87.331 Chronic venous hypertension (idiopathic) with ulcer and inflammation of 03/30/2021 No Yes right lower extremity I87.332 Chronic venous hypertension (idiopathic) with ulcer and inflammation of 03/30/2021 No Yes left lower extremity S91.302D Unspecified open wound, left foot, subsequent encounter 03/30/2021 No Yes Inactive Problems Resolved Problems Electronic Signature(s) Signed: 04/20/2021 4:22:04 PM By: Kalman Shan DO Entered By: Kalman Shan on 04/20/2021 16:11:31 [name]  ([id number]) -------------------------------------------------------------------------------- Progress Note Details Patient Name: [name]. Date of Service: 04/20/2021 2:45 PM Medical Record Number: [id number] Patient Account Number: [id number] Date of Birth/Sex: [birth year] (70 y.o. F) Treating RN: Cornell Barman Primary Care Provider: Rutherford Guys Other Clinician: Referring Provider: Rutherford Guys Treating Provider/Extender: Yaakov Guthrie in Treatment: 3 Subjective Chief Complaint Information obtained from Patient Bilateral lower extremity wounds History of Present Illness (HPI) Admission 10/5 Ms. Zelphia Glover is a 70 year old female with a past medical history of hypertension, venous insufficiency, chronic diastolic heart failure and insulin-dependent type 2 diabetes that presents To the clinic for bilateral lower extremity wounds. She states that her right lower extremity developed a blister 1 week ago and she  has been keeping the area covered with a bandaid. She states that when the Band-Aid came off it tore her skin. She subsequently developed pain, increased warmth and erythema to the right lower extremity. She denies purulent drainage. She has also developed an open wound to the left lower extremity that recently developed. She also has a small open wound to the proximal portion of the left great toe. This has been present since August and was evaluated by podiatry. It has not healed and she has not followed up with podiatry for this issue. 10/12; patient presents for follow-up. She states she has 2 days left on her antibiotics. She is tolerated this well. She reports improvement to her right lower extremity symptoms. She denies signs of infection. She has home health. 10/26; patient presents for follow-up. She has no issues or complaints today. The compression wraps slid down to her shin. These were placed on Monday by home health. She has a couple of new open wounds due  to this. She denies signs of infection. Patient History Information obtained from Patient. Allergies Niaspan Extended-Release (Severity: Severe, Reaction: anaphylaxis), tetanus toxoid, adsorbed (Severity: Severe), Bactrim (Severity: Moderate), Januvia (Severity: Moderate), lisinopril (Severity: Moderate), Cipro, Augmentin (Reaction: rash) Family History Cancer - Maternal Grandparents,Paternal Grandparents,Mother,Father, Diabetes - Maternal Grandparents,Paternal Grandparents,Mother,Siblings, Heart Disease - Father,Siblings, Hypertension - Siblings,Father,Mother, No family history of Hereditary Spherocytosis, Kidney Disease, Lung Disease, Seizures, Stroke, Thyroid Problems, Tuberculosis. Social History Never smoker, Marital Status - Single, Alcohol Use - Never, Drug Use - No History, Caffeine Use - Never. Medical History Eyes Patient has history of Cataracts Denies history of Glaucoma, Optic Neuritis Ear/Nose/Mouth/Throat Denies history of Chronic sinus problems/congestion, Middle ear problems Hematologic/Lymphatic Denies history of Anemia, Hemophilia, Human Immunodeficiency Virus, Lymphedema, Sickle Cell Disease Respiratory Denies history of Aspiration, Asthma, Chronic Obstructive Pulmonary Disease (COPD), Pneumothorax, Sleep Apnea, Tuberculosis Cardiovascular Patient has history of Arrhythmia - Afib, Hypertension Denies history of Angina, Congestive Heart Failure, Coronary Artery Disease, Deep Vein Thrombosis, Hypotension, Myocardial Infarction, Peripheral Arterial Disease, Peripheral Venous Disease, Phlebitis, Vasculitis Gastrointestinal Denies history of Cirrhosis , Colitis, Crohn s, Hepatitis A, Hepatitis B, Hepatitis C Endocrine Patient has history of Type II Diabetes Denies history of Type I Diabetes Genitourinary Denies history of End Stage Renal Disease Immunological Denies history of Lupus Erythematosus, Raynaud s, Scleroderma Integumentary (Skin) Denies history of History  of Burn, History of pressure wounds Musculoskeletal Patient has history of Osteoarthritis Denies history of Gout, Rheumatoid Arthritis, Osteomyelitis [name], [name] ([id number]) Neurologic Patient has history of Neuropathy - fingers and feet Denies history of Dementia, Quadriplegia, Paraplegia, Seizure Disorder Oncologic Denies history of Received Chemotherapy, Received Radiation Psychiatric Denies history of Anorexia/bulimia, Confinement Anxiety Medical And Surgical History Notes Cardiovascular "heart valve problem" Objective Constitutional Vitals Time Taken: 2:42 PM, Height: 66 in, Weight: 332 lbs, BMI: 53.6, Temperature: 97.6 F, Pulse: 93 bpm, Respiratory Rate: 20 breaths/min, Blood Pressure: 165/84 mmHg, Pulse Oximetry: 93 %. General Notes: Patient takes medication for Blood Pressure. She states she hasn't taken it for the second time today. Patient o2 sats at 93% on room air. General Notes: Right lower extremity: Scattered open wounds with granulation tissue present. No signs of infection. Left lower extremity: To the left lateral leg and dorsal left foot there are open wounds limited to skin breakdown. No signs of infection. Weeping on exam bilaterally. Integumentary (Hair, Skin) Wound #2 status is Open. Original cause of wound was Blister. The date acquired was: 03/23/2021. The wound has been in treatment 3 weeks. The wound  is located on the Right Lower Leg. The wound measures 2cm length x 9cm width x 0.1cm depth; 14.137cm^2 area and 1.414cm^3 volume. There is Fat Layer (Subcutaneous Tissue) exposed. There is a large amount of serous drainage noted. There is large (67-100%) red granulation within the wound bed. There is no necrotic tissue within the wound bed. Wound #3 status is Open. Original cause of wound was Gradually Appeared. The date acquired was: 02/09/2021. The wound has been in treatment 3 weeks. The wound is located on the Left Toe Great. The wound measures 0.1cm  length x 0.1cm width x 0.1cm depth; 0.008cm^2 area and 0.001cm^3 volume. There is a none present amount of drainage noted. There is no granulation within the wound bed. There is no necrotic tissue within the wound bed. Wound #4 status is Open. Original cause of wound was Blister. The date acquired was: 03/30/2021. The wound has been in treatment 3 weeks. The wound is located on the Left Lower Leg. The wound measures 6cm length x 1.5cm width x 0.2cm depth; 7.069cm^2 area and 1.414cm^3 volume. There is a medium amount of serous drainage noted. Wound #5 status is Open. Original cause of wound was Gradually Appeared. The date acquired was: 04/18/2021. The wound is located on the Right,Medial Lower Leg. The wound measures 1cm length x 0.9cm width x 0.2cm depth; 0.707cm^2 area and 0.141cm^3 volume. Wound #6 status is Open. Original cause of wound was Gradually Appeared. The date acquired was: 04/11/2021. The wound is located on the Left,Lateral Lower Leg. The wound measures 0.5cm length x 0.5cm width x 0.1cm depth; 0.196cm^2 area and 0.02cm^3 volume. There is Fat Layer (Subcutaneous Tissue) exposed. There is a large amount of serous drainage noted. There is large (67-100%) granulation within the wound bed. There is no necrotic tissue within the wound bed. Wound #7 status is Open. Original cause of wound was Gradually Appeared. The date acquired was: 04/18/2021. The wound is located on the Left,Dorsal Foot. The wound measures 0.3cm length x 0.4cm width x 0.1cm depth; 0.094cm^2 area and 0.009cm^3 volume. There is Fat Layer (Subcutaneous Tissue) exposed. There is no tunneling or undermining noted. There is a medium amount of serous drainage noted. The wound margin is flat and intact. There is no granulation within the wound bed. There is no necrotic tissue within the wound bed. Assessment Active Problems ICD-10 Chronic venous hypertension (idiopathic) with ulcer and inflammation of right lower  extremity Chronic venous hypertension (idiopathic) with ulcer and inflammation of left lower extremity Unspecified open wound, left foot, subsequent encounter [name], [name]. ([id number]) Unfortunately patient's compression wraps did not stay in place and rolled down creating new wounds. We have been using Kerlix/Coban. She was able to tolerate ABIs today and they were 1.18 bilaterally. I will go up on compression to 3 layer with Hydrofera Blue underneath. If this does not help control the swelling then she may benefit from taking an extra dose of her Lasix. We will revisit this at next clinic visit. No signs of infection on exam. Procedures Wound #4 Pre-procedure diagnosis of Wound #4 is a Venous Leg Ulcer located on the Left Lower Leg . There was a Three Layer Compression Therapy Procedure with a pre-treatment ABI of 1.2 by Cornell Barman, RN. Post procedure Diagnosis Wound #4: Same as Pre-Procedure Wound #5 Pre-procedure diagnosis of Wound #5 is a Venous Leg Ulcer located on the Right,Medial Lower Leg . There was a Three Layer Compression Therapy Procedure with a pre-treatment ABI of 1.2 by Cornell Barman,  RN. Post procedure Diagnosis Wound #5: Same as Pre-Procedure Wound #6 Pre-procedure diagnosis of Wound #6 is a Venous Leg Ulcer located on the Left,Lateral Lower Leg . There was a Three Layer Compression Therapy Procedure with a pre-treatment ABI of 1.2 by Cornell Barman, RN. Post procedure Diagnosis Wound #6: Same as Pre-Procedure Plan Follow-up Appointments: Return Appointment in 2 weeks. Home Health: [place]: - [facility] for wound care. May utilize formulary equivalent dressing for wound treatment orders unless otherwise specified. Home Health Nurse may visit PRN to address patient s wound care needs. Scheduled days for dressing changes to be completed; exception, patient has scheduled wound care visit that day. **Please direct any NON-WOUND related  issues/requests for orders to patient's Primary Care Physician. **If current dressing causes regression in wound condition, may D/C ordered dressing product/s and apply Normal Saline Moist Dressing daily until next [place] or Other MD appointment. **Notify [facility] of regression in wound condition at [phone]. Bathing/ Shower/ Hygiene: May shower with wound dressing protected with water repellent cover or cast protector. No tub bath. WOUND #2: - Lower Leg Wound Laterality: Right Cleanser: Wound Cleanser 3 x Per Week/30 Days Discharge Instructions: Wash your hands with soap and water. Remove old dressing, discard into plastic bag and place into trash. Cleanse the wound with Wound Cleanser prior to applying a clean dressing using gauze sponges, not tissues or cotton balls. Do not scrub or use excessive force. Pat dry using gauze sponges, not tissue or cotton balls. Primary Dressing: Hydrofera Blue Ready Transfer Foam, 2.5x2.5 (in/in) (Generic) 3 x Per Week/30 Days Discharge Instructions: Apply Hydrofera Blue Ready to wound bed as directed Secondary Dressing: Xtrasorb Large 6x9 (in/in) (Generic) 3 x Per Week/30 Days Discharge Instructions: Apply to wound as directed. Do not cut. Compression Wrap: Profore Lite LF 3 Multilayer Compression Bandaging System (Generic) 3 x Per Week/30 Days Discharge Instructions: Apply 3 multi-layer wrap as prescribed. WOUND #3: - Toe Great Wound Laterality: Left Primary Dressing: Hydrofera Blue Ready Transfer Foam, 2.5x2.5 (in/in) 3 x Per Week/30 Days Discharge Instructions: Apply Hydrofera Blue Ready to wound bed as directed Secondary Dressing: Coverlet Latex-Free Fabric Adhesive Dressings 3 x Per Week/30 Days Discharge Instructions: 1.5 x 2 WOUND #4: - Lower Leg Wound Laterality: Left Cleanser: Wound Cleanser 3 x Per Week/30 Days Discharge Instructions: Wash your hands with soap and water. Remove old dressing, discard into plastic  bag and place into trash. Cleanse the wound with Wound Cleanser prior to applying a clean dressing using gauze sponges, not tissues or cotton balls. Do not scrub or use excessive force. Pat dry using gauze sponges, not tissue or cotton balls. Primary Dressing: Hydrofera Blue Ready Transfer Foam, 2.5x2.5 (in/in) (Generic) 3 x Per Week/30 Days Discharge Instructions: Apply Hydrofera Blue Ready to wound bed as directed Secondary Dressing: Xtrasorb Large 6x9 (in/in) (Generic) 3 x Per Week/30 Days Discharge Instructions: Apply to wound as directed. Do not cut. Compression Wrap: Profore Lite LF 3 Multilayer Compression Bandaging System (Generic) 3 x Per Week/30 Days Discharge Instructions: Apply 3 multi-layer wrap as prescribed. WOUND #5: - Lower Leg Wound Laterality: Right, Medial Cleanser: Wound Cleanser 3 x Per Week/30 Days [name], [name] ([id number]) Discharge Instructions: Wash your hands with soap and water. Remove old dressing, discard into plastic bag and place into trash. Cleanse the wound with Wound Cleanser prior to applying a clean dressing using gauze sponges, not tissues or cotton balls. Do not scrub or use excessive force. [place]  dry using gauze sponges, not tissue or cotton balls. Primary Dressing: Hydrofera Blue Ready Transfer Foam, 2.5x2.5 (in/in) (Generic) 3 x Per Week/30 Days Discharge Instructions: Apply Hydrofera Blue Ready to wound bed as directed Secondary Dressing: Xtrasorb Large 6x9 (in/in) (Generic) 3 x Per Week/30 Days Discharge Instructions: Apply to wound as directed. Do not cut. Compression Wrap: Profore Lite LF 3 Multilayer Compression Bandaging System (Generic) 3 x Per Week/30 Days Discharge Instructions: Apply 3 multi-layer wrap as prescribed. WOUND #6: - Lower Leg Wound Laterality: Left, Lateral Cleanser: Wound Cleanser 3 x Per Week/30 Days Discharge Instructions: Wash your hands with soap and water. Remove old dressing, discard into plastic bag and place into  trash. Cleanse the wound with Wound Cleanser prior to applying a clean dressing using gauze sponges, not tissues or cotton balls. Do not scrub or use excessive force. Pat dry using gauze sponges, not tissue or cotton balls. Primary Dressing: Hydrofera Blue Ready Transfer Foam, 2.5x2.5 (in/in) (Generic) 3 x Per Week/30 Days Discharge Instructions: Apply Hydrofera Blue Ready to wound bed as directed Secondary Dressing: Xtrasorb Large 6x9 (in/in) (Generic) 3 x Per Week/30 Days Discharge Instructions: Apply to wound as directed. Do not cut. Compression Wrap: Profore Lite LF 3 Multilayer Compression Bandaging System (Generic) 3 x Per Week/30 Days Discharge Instructions: Apply 3 multi-layer wrap as prescribed. WOUND #7: - Foot Wound Laterality: Dorsal, Left Cleanser: Wound Cleanser 3 x Per Week/30 Days Discharge Instructions: Wash your hands with soap and water. Remove old dressing, discard into plastic bag and place into trash. Cleanse the wound with Wound Cleanser prior to applying a clean dressing using gauze sponges, not tissues or cotton balls. Do not scrub or use excessive force. Pat dry using gauze sponges, not tissue or cotton balls. Primary Dressing: Hydrofera Blue Ready Transfer Foam, 2.5x2.5 (in/in) (Generic) 3 x Per Week/30 Days Discharge Instructions: Apply Hydrofera Blue Ready to wound bed as directed Secondary Dressing: Xtrasorb Large 6x9 (in/in) (Generic) 3 x Per Week/30 Days Discharge Instructions: Apply to wound as directed. Do not cut. Compression Wrap: Profore Lite LF 3 Multilayer Compression Bandaging System (Generic) 3 x Per Week/30 Days Discharge Instructions: Apply 3 multi-layer wrap as prescribed. 1. Hydrofera Blue under 3 layer compression 2. Follow-up in 1 week Electronic Signature(s) Signed: 04/20/2021 4:22:04 PM By: Kalman Shan DO Entered By: Kalman Shan on 04/20/2021 16:21:11 [name]  ([id number]) -------------------------------------------------------------------------------- ROS/PFSH Details Patient Name: [name]. Date of Service: 04/20/2021 2:45 PM Medical Record Number: [id number] Patient Account Number: [id number] Date of Birth/Sex: 1951-02-01 (70 y.o. F) Treating RN: Cornell Barman Primary Care Provider: Rutherford Guys Other Clinician: Referring Provider: Rutherford Guys Treating Provider/Extender: Yaakov Guthrie in Treatment: 3 Information Obtained From Patient Eyes Medical History: Positive for: Cataracts Negative for: Glaucoma; Optic Neuritis Ear/Nose/Mouth/Throat Medical History: Negative for: Chronic sinus problems/congestion; Middle ear problems Hematologic/Lymphatic Medical History: Negative for: Anemia; Hemophilia; Human Immunodeficiency Virus; Lymphedema; Sickle Cell Disease Respiratory Medical History: Negative for: Aspiration; Asthma; Chronic Obstructive Pulmonary Disease (COPD); Pneumothorax; Sleep Apnea; Tuberculosis Cardiovascular Medical History: Positive for: Arrhythmia - Afib; Hypertension Negative for: Angina; Congestive Heart Failure; Coronary Artery Disease; Deep Vein Thrombosis; Hypotension; Myocardial Infarction; Peripheral Arterial Disease; Peripheral Venous Disease; Phlebitis; Vasculitis Past Medical History Notes: "heart valve problem" Gastrointestinal Medical History: Negative for: Cirrhosis ; Colitis; Crohnos; Hepatitis A; Hepatitis B; Hepatitis C Endocrine Medical History: Positive for: Type II Diabetes Negative for: Type I Diabetes Time with diabetes: since 2007 Treated with: Insulin, Oral agents Blood sugar tested every day: Yes Tested :  daily Genitourinary Medical History: Negative for: End Stage Renal Disease Immunological Medical History: Negative for: Lupus Erythematosus; Raynaudos; Scleroderma Integumentary (Skin) [name], [name] ([id number]) Medical History: Negative for: History of Burn; History  of pressure wounds Musculoskeletal Medical History: Positive for: Osteoarthritis Negative for: Gout; Rheumatoid Arthritis; Osteomyelitis Neurologic Medical History: Positive for: Neuropathy - fingers and feet Negative for: Dementia; Quadriplegia; Paraplegia; Seizure Disorder Oncologic Medical History: Negative for: Received Chemotherapy; Received Radiation Psychiatric Medical History: Negative for: Anorexia/bulimia; Confinement Anxiety HBO Extended History Items Eyes: Cataracts Immunizations Pneumococcal Vaccine: Received Pneumococcal Vaccination: Yes Received Pneumococcal Vaccination On or After 60th Birthday: No Implantable Devices Yes Family and Social History Cancer: Yes - Maternal Grandparents,Paternal Grandparents,Mother,Father; Diabetes: Yes - Maternal Grandparents,Paternal Grandparents,Mother,Siblings; Heart Disease: Yes - Father,Siblings; Hereditary Spherocytosis: No; Hypertension: Yes - Siblings,Father,Mother; Kidney Disease: No; Lung Disease: No; Seizures: No; Stroke: No; Thyroid Problems: No; Tuberculosis: No; Never smoker; Marital Status - Single; Alcohol Use: Never; Drug Use: No History; Caffeine Use: Never; Financial Concerns: Yes; Food, Clothing or Shelter Needs: No; Support System Lacking: No; Transportation Concerns: No Engineer, maintenance) Signed: 04/20/2021 4:15:45 PM By: Gretta Cool, BSN, RN, CWS, Kim RN, BSN Signed: 04/20/2021 4:22:04 PM By: Kalman Shan DO Entered By: Kalman Shan on 04/20/2021 16:13:15 [name] ([id number]) -------------------------------------------------------------------------------- [facility] Details Patient Name: [name]. Date of Service: 04/20/2021 Medical Record Number: [id number] Patient Account Number: [id number] Date of Birth/Sex: 1951-05-27 (70 y.o. F) Treating RN: Cornell Barman Primary Care Provider: Rutherford Guys Other Clinician: Referring Provider: Rutherford Guys Treating Provider/Extender: Yaakov Guthrie in Treatment: 3 Diagnosis Coding ICD-10 Codes Code Description [phone] Chronic venous hypertension (idiopathic) with ulcer and inflammation of right lower extremity I87.332 Chronic venous hypertension (idiopathic) with ulcer and inflammation of left lower extremity S91.302D Unspecified open wound, left foot, subsequent encounter Facility Procedures CPT4: Description Modifier Quantity Code [id number] BILATERAL: Application of multi-layer venous compression system; leg (below knee), including 1 ankle and foot. Physician Procedures CPT4 Code Description: [id number] - WC PHYS LEVEL 3 - EST PT Modifier: Quantity: 1 CPT4 Code Description: ICD-10 Diagnosis Description I87.331 Chronic venous hypertension (idiopathic) with ulcer and inflammation of I87.332 Chronic venous hypertension (idiopathic) with ulcer and inflammation of S91.302D Unspecified open wound, left  foot, subsequent encounter Modifier: right lower extremit left lower extremity Quantity: y Engineer, maintenance) Signed: 04/20/2021 4:22:04 PM By: Kalman Shan DO Previous Signature: 04/20/2021 4:15:45 PM Version By: Gretta Cool, BSN, RN, CWS, Kim RN, BSN Entered By: Kalman Shan on 04/20/2021 16:21:37

## 2021-04-27 ENCOUNTER — Encounter: Payer: Medicare Other | Attending: Internal Medicine | Admitting: Internal Medicine

## 2021-04-27 ENCOUNTER — Other Ambulatory Visit: Payer: Self-pay

## 2021-04-27 DIAGNOSIS — I87332 Chronic venous hypertension (idiopathic) with ulcer and inflammation of left lower extremity: Secondary | ICD-10-CM | POA: Diagnosis not present

## 2021-04-27 DIAGNOSIS — S91302A Unspecified open wound, left foot, initial encounter: Secondary | ICD-10-CM | POA: Insufficient documentation

## 2021-04-27 DIAGNOSIS — L97929 Non-pressure chronic ulcer of unspecified part of left lower leg with unspecified severity: Secondary | ICD-10-CM | POA: Insufficient documentation

## 2021-04-27 DIAGNOSIS — I110 Hypertensive heart disease with heart failure: Secondary | ICD-10-CM | POA: Insufficient documentation

## 2021-04-27 DIAGNOSIS — X58XXXA Exposure to other specified factors, initial encounter: Secondary | ICD-10-CM | POA: Insufficient documentation

## 2021-04-27 DIAGNOSIS — I872 Venous insufficiency (chronic) (peripheral): Secondary | ICD-10-CM | POA: Insufficient documentation

## 2021-04-27 DIAGNOSIS — Z794 Long term (current) use of insulin: Secondary | ICD-10-CM | POA: Insufficient documentation

## 2021-04-27 DIAGNOSIS — I5032 Chronic diastolic (congestive) heart failure: Secondary | ICD-10-CM | POA: Diagnosis not present

## 2021-04-27 DIAGNOSIS — I87331 Chronic venous hypertension (idiopathic) with ulcer and inflammation of right lower extremity: Secondary | ICD-10-CM

## 2021-04-27 DIAGNOSIS — I87333 Chronic venous hypertension (idiopathic) with ulcer and inflammation of bilateral lower extremity: Secondary | ICD-10-CM | POA: Diagnosis present

## 2021-04-27 DIAGNOSIS — E11621 Type 2 diabetes mellitus with foot ulcer: Secondary | ICD-10-CM | POA: Diagnosis not present

## 2021-04-27 DIAGNOSIS — L97919 Non-pressure chronic ulcer of unspecified part of right lower leg with unspecified severity: Secondary | ICD-10-CM | POA: Insufficient documentation

## 2021-04-27 DIAGNOSIS — S91302D Unspecified open wound, left foot, subsequent encounter: Secondary | ICD-10-CM

## 2021-04-28 NOTE — Progress Notes (Signed)
[name], [name] ([id number]) Visit Report for 04/27/2021 Arrival Information Details Patient Name: [name], [name]. Date of Service: 04/27/2021 3:15 PM Medical Record Number: [id number] Patient Account Number: [id number] Date of Birth/Sex: 03/27/1951 (70 y.o. F) Treating RN: Donnamarie Poag Primary Care Provider: Rutherford Guys Other Clinician: Referring Provider: Rutherford Guys Treating Provider/Extender: Yaakov Guthrie in Treatment: 4 Visit Information History Since Last Visit Added or deleted any medications: No Patient Arrived: [name] Had a fall or experienced change in No Arrival Time: 15:46 activities of daily living that may affect Accompanied By: self risk of falls: Transfer Assistance: None Hospitalized since last visit: No Patient Identification Verified: Yes Has Dressing in Place as Prescribed: Yes Secondary Verification Process Completed: Yes Has Compression in Place as Prescribed: Yes Patient Has Alerts: Yes Pain Present Now: Yes Patient Alerts: Patient on [place] Signature(s) Signed: 04/27/2021 4:37:25 PM By: Donnamarie Poag Entered By: Donnamarie Poag on 04/27/2021 15:51:09 [name] ([id number]) -------------------------------------------------------------------------------- Compression Therapy Details Patient Name: [name]. Date of Service: 04/27/2021 3:15 PM Medical Record Number: [id number] Patient Account Number: [id number] Date of Birth/Sex: 03-16-1951 (70 y.o. F) Treating RN: Donnamarie Poag Primary Care Provider: Rutherford Guys Other Clinician: Referring Provider: Rutherford Guys Treating Provider/Extender: Yaakov Guthrie in Treatment: 4 Compression Therapy Performed for Wound Assessment: Wound #4 Left Lower Leg Performed By: Clinician Donnamarie Poag, RN Compression Type: Three Layer Post Procedure Diagnosis Same as Pre-procedure Electronic Signature(s) Signed: 04/27/2021 4:37:25 PM By: Donnamarie Poag Entered By:  Donnamarie Poag on 04/27/2021 16:20:48 [name] ([id number]) -------------------------------------------------------------------------------- Compression Therapy Details Patient Name: [name]. Date of Service: 04/27/2021 3:15 PM Medical Record Number: [id number] Patient Account Number: [id number] Date of Birth/Sex: 10/21/1950 (70 y.o. F) Treating RN: Donnamarie Poag Primary Care Provider: Rutherford Guys Other Clinician: Referring Provider: Rutherford Guys Treating Provider/Extender: Yaakov Guthrie in Treatment: 4 Compression Therapy Performed for Wound Assessment: Wound #5 Right,Medial Lower Leg Performed By: Clinician Donnamarie Poag, RN Compression Type: Three Layer Post Procedure Diagnosis Same as Pre-procedure Electronic Signature(s) Signed: 04/27/2021 4:37:25 PM By: Donnamarie Poag Entered By: Donnamarie Poag on 04/27/2021 16:21:36 [name] ([id number]) -------------------------------------------------------------------------------- Encounter Discharge Information Details Patient Name: [name]. Date of Service: 04/27/2021 3:15 PM Medical Record Number: [id number] Patient Account Number: [id number] Date of Birth/Sex: [birth year] (70 y.o. F) Treating RN: Donnamarie Poag Primary Care Provider: Rutherford Guys Other Clinician: Referring Provider: Rutherford Guys Treating Provider/Extender: Yaakov Guthrie in Treatment: 4 Encounter Discharge Information Items Post Procedure Vitals Discharge Condition: Stable Temperature (F): 98.2 Ambulatory Status: Walker Pulse (bpm): 89 Discharge Destination: Home Health Respiratory Rate (breaths/min): 16 Telephoned: No Blood Pressure (mmHg): 126/84 Orders Sent: Yes Transportation: Other Accompanied By: driver Schedule Follow-up Appointment: Yes Clinical Summary of Care: Electronic Signature(s) Signed: 04/27/2021 5:10:13 PM By: Donnamarie Poag Entered By: Donnamarie Poag on 04/27/2021 17:03:27 [name]  ([id number]) -------------------------------------------------------------------------------- Lower Extremity Assessment Details Patient Name: [name]. Date of Service: 04/27/2021 3:15 PM Medical Record Number: [id number] Patient Account Number: [id number] Date of Birth/Sex: 01-22-1951 (70 y.o. F) Treating RN: Donnamarie Poag Primary Care Provider: Rutherford Guys Other Clinician: Referring Provider: Rutherford Guys Treating Provider/Extender: Yaakov Guthrie in Treatment: 4 Edema Assessment Assessed: [Left: Yes] [Right: Yes] Edema: [Left: Yes] [Right: Yes] Calf Left: Right: Point of Measurement: 29 cm From Medial Instep 45 cm 48 cm Ankle Left: Right: Point of Measurement: 10 cm From Medial Instep 22.4 cm 22.5 cm Vascular Assessment Pulses: Dorsalis Pedis Palpable: [Left:Yes] [Right:Yes] Electronic Signature(s) Signed: 04/27/2021 4:37:25  PM By: Bishop, Joy Entered By: Donnamarie Poag on 04/27/2021 16:16:48 [name] ([id number]) -------------------------------------------------------------------------------- Multi Wound Chart Details Patient Name: [name]. Date of Service: 04/27/2021 3:15 PM Medical Record Number: [id number] Patient Account Number: [id number] Date of Birth/Sex: 07/04/1950 (70 y.o. 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 Comorbid History: Cataracts, Arrhythmia, Cataracts, Arrhythmia, Cataracts, Arrhythmia, Hypertension,  Type II Diabetes, Hypertension, Type II Diabetes, Hypertension, Type II Diabetes, Osteoarthritis, Neuropathy Osteoarthritis, Neuropathy Osteoarthritis, Neuropathy Date Acquired: 03/23/2021 02/09/2021 03/30/2021 Weeks of Treatment: _0 Wound Status: Open Open Open Clustered Wound: Yes No Yes Measurements L x W x D (cm) 0.6x0.6x0.1 0.4x0.3x0.1 5.4x1x0.1 Area (cm) : 0.283 0.094 4.241 Volume (cm) : 0.028 0.009 0.424 % Reduction in Area: 98.90% 0.00% -80.00% % Reduction in Volume: 99.40% 0.00% 10.00% Classification: Full Thickness Without Exposed Grade 1 Full Thickness Without Exposed Support Structures Support Structures Exudate Amount: Large None Present Medium Exudate Type: Serous N/A Serous Exudate Color: amber N/A amber Wound Margin: N/A N/A N/A Granulation Amount: Small (1-33%) None Present (0%) Large (67-100%) Granulation Quality: Red, Pink N/A Red, Pink Necrotic Amount: Large (67-100%) Large (67-100%) Small (1-33%) Necrotic Tissue: Eschar, Adherent Slough Eschar N/A Exposed Structures: Fat Layer (Subcutaneous Tissue): Fascia: No N/A Yes Fat Layer (Subcutaneous Tissue): Fascia: No No Tendon: No Tendon: No Muscle: No Muscle: No [name], [name] ([id number]) Joint: No Joint: No Bone: No Bone: No Epithelialization: None None N/A Debridement: N/A Debridement - Excisional N/A Pre-procedure Verification/Time N/A 16:20 N/A Out Taken: Pain Control: N/A Lidocaine N/A Tissue Debrided: N/A Subcutaneous, Slough N/A Level: N/A Skin/Subcutaneous Tissue N/A Debridement Area (sq cm): N/A 0.12 N/A Instrument: N/A Curette N/A Bleeding: N/A Minimum N/A Hemostasis Achieved: N/A Pressure N/A Debridement Treatment N/A Procedure was tolerated well N/A Response: Post Debridement N/A 0.4x0.3x0.1 N/A Measurements L x W x D (cm) Post Debridement Volume: N/A 0.009 N/A (cm) Assessment Notes: N/A N/A plantar part of foot appearance pic attached with wet dressing removed bottom of  foot Procedures Performed: N/A Debridement Compression Therapy Wound Number: _1 Photos: Wound Location: Right, Medial Lower Leg Left, Lateral Lower Leg Left, Dorsal Foot Wounding Event: Gradually Appeared Gradually Appeared Gradually Appeared 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00% 88.90% Classification: Full Thickness Without Exposed Partial Thickness Grade 1 Support Structures Exudate Amount: Large None Present None Present Exudate Type: Serous N/A N/A Exudate Color: amber N/A N/A Wound Margin: N/A N/A Flat and Intact Granulation Amount: Large (67-100%) None Present (0%) Small (1-33%) Granulation Quality: Red, Pink N/A Pink Necrotic Amount: Small (1-33%) None Present (0%) None Present (  0%) Necrotic Tissue: Adherent Slough N/A N/A Exposed Structures: N/A Fat Layer (Subcutaneous Tissue): Fat Layer (Subcutaneous Tissue): Yes Yes Fascia: No Tendon: No Muscle: No Joint: No Bone: No Epithelialization: N/A N/A None Debridement: N/A N/A N/A Pain Control: N/A N/A N/A Tissue Debrided: N/A N/A N/A Level: N/A N/A N/A Debridement Area (sq cm): N/A N/A N/A [name], [name] ([id number]) Instrument: N/A N/A N/A Bleeding: N/A N/A N/A Hemostasis Achieved: N/A N/A N/A Debridement Treatment N/A N/A N/A Response: Post Debridement  N/A N/A N/A Measurements L x W x D (cm) Post Debridement Volume: N/A N/A N/A (cm) Assessment Notes: N/A N/A N/A Procedures Performed: Compression Therapy N/A N/A Wound Number: 8 N/A N/A Photos: N/A N/A Wound Location: Right, Distal Lower Leg N/A N/A Wounding Event: Gradually Appeared N/A N/A Primary Etiology: Venous Leg Ulcer N/A N/A Comorbid History: Cataracts, Arrhythmia, N/A N/A Hypertension, Type II Diabetes, Osteoarthritis, Neuropathy Date Acquired: 04/27/2021 N/A N/A Weeks of Treatment: 0 N/A N/A Wound Status: Open N/A N/A Clustered Wound: No N/A N/A Measurements L x W x D (cm) 0.9x0.8x0.1 N/A N/A Area (cm) : 0.565 N/A N/A Volume (cm) : 0.057 N/A N/A % Reduction in Area: N/A N/A N/A % Reduction in Volume: N/A N/A N/A Classification: Full Thickness Without Exposed N/A N/A Support Structures Exudate Amount: Medium N/A N/A Exudate Type: Serous N/A N/A Exudate Color: amber N/A N/A Wound Margin: N/A N/A N/A Granulation Amount: Large (67-100%) N/A N/A Granulation Quality: Red N/A N/A Necrotic Amount: None Present (0%) N/A N/A Necrotic Tissue: N/A N/A N/A Exposed Structures: N/A N/A N/A Epithelialization: N/A N/A N/A Debridement: N/A N/A N/A Pain Control: N/A N/A N/A Tissue Debrided: N/A N/A N/A Level: N/A N/A N/A Debridement Area (sq cm): N/A N/A N/A Instrument: N/A N/A N/A Bleeding: N/A N/A N/A Hemostasis Achieved: N/A N/A N/A Debridement Treatment N/A N/A N/A Response: Post Debridement N/A N/A N/A Measurements L x W x D (cm) Post Debridement Volume: N/A N/A N/A (cm) Assessment Notes: N/A N/A N/A Procedures Performed: N/A N/A N/A Treatment Notes Wound #2 (Lower Leg) Wound Laterality: Right Cleanser [name], [name] ([id number]) Wound Cleanser Discharge Instruction: Wash your hands with soap and water. Remove old dressing, discard into plastic bag and place into trash. Cleanse the wound with Wound Cleanser prior to applying a clean dressing using gauze  sponges, not tissues or cotton balls. Do not scrub or use excessive force. Pat dry using gauze sponges, not tissue or cotton balls. Peri-Wound Care Desitin Maximum Strength Ointment 4 (oz) Discharge Instruction: Apply generously to peri wound for protection from drainage Topical Primary Dressing Silvercel 4 1/4x 4 1/4 (in/in) Discharge Instruction: Apply Silvercel 4 1/4x 4 1/4 (in/in) as instructed Secondary Dressing Xtrasorb Large 6x9 (in/in) Discharge Instruction: Apply to wound as directed. Do not cut. Secured With Compression Wrap Profore Lite LF 3 Multilayer Compression Bandaging System Discharge Instruction: Apply 3 multi-layer wrap as prescribed. Compression Stockings Add-Ons Wound #3 (Toe Great) Wound Laterality: Left Cleanser Wound Cleanser Discharge Instruction: Wash your hands with soap and water. Remove old dressing, discard into plastic bag and place into trash. Cleanse the wound with Wound Cleanser prior to applying a clean dressing using gauze sponges, not tissues or cotton balls. Do not scrub or use excessive force. Pat dry using gauze sponges, not tissue or cotton balls. Peri-Wound Care Desitin Maximum Strength Ointment 4 (oz) Discharge Instruction: Apply to peri wound for protection from drainage Topical Primary Dressing Silvercel 4 1/4x 4 1/4 (in/in) Discharge Instruction: Apply Silvercel 4 1/4x 4 1/4 (in/in)  as instructed Secondary Dressing Xtrasorb Large 6x9 (in/in) Discharge Instruction: Apply to wound as directed. Do not cut. Secured With Compression Wrap Profore Lite LF 3 Multilayer Compression Bandaging System Discharge Instruction: Apply 3 multi-layer wrap as prescribed. Compression Stockings Add-Ons Wound #4 (Lower Leg) Wound Laterality: Left Cleanser Wound Cleanser Discharge Instruction: Wash your hands with soap and water. Remove old dressing, discard into plastic bag and place into trash. Cleanse the wound with Wound Cleanser prior to applying  a clean dressing using gauze sponges, not tissues or cotton balls. Do not [name], Miranda F. ([id number]) scrub or use excessive force. Pat dry using gauze sponges, not tissue or cotton balls. Peri-Wound Care Desitin Maximum Strength Ointment 4 (oz) Discharge Instruction: Apply to peri wound for protection from drainage Topical Primary Dressing Silvercel 4 1/4x 4 1/4 (in/in) Discharge Instruction: Apply Silvercel 4 1/4x 4 1/4 (in/in) as instructed Secondary Dressing Xtrasorb Large 6x9 (in/in) Discharge Instruction: Apply to wound as directed. Do not cut. Secured With Compression Wrap Profore Lite LF 3 Multilayer Compression Bandaging System Discharge Instruction: Apply 3 multi-layer wrap as prescribed. Compression Stockings Add-Ons Wound #5 (Lower Leg) Wound Laterality: Right, Medial Cleanser Wound Cleanser Discharge Instruction: Wash your hands with soap and water. Remove old dressing, discard into plastic bag and place into trash. Cleanse the wound with Wound Cleanser prior to applying a clean dressing using gauze sponges, not tissues or cotton balls. Do not scrub or use excessive force. Pat dry using gauze sponges, not tissue or cotton balls. Peri-Wound Care Desitin Maximum Strength Ointment 4 (oz) Discharge Instruction: Apply to peri wound for protection from drainage Topical Primary Dressing Silvercel 4 1/4x 4 1/4 (in/in) Discharge Instruction: Apply Silvercel 4 1/4x 4 1/4 (in/in) as instructed Secondary Dressing Xtrasorb Large 6x9 (in/in) Discharge Instruction: Apply to wound as directed. Do not cut. Secured With Compression Wrap Profore Lite LF 3 Multilayer Compression Bandaging System Discharge Instruction: Apply 3 multi-layer wrap as prescribed. Compression Stockings Add-Ons Wound #7 (Foot) Wound Laterality: Dorsal, Left Cleanser Wound Cleanser Discharge Instruction: Wash your hands with soap and water. Remove old dressing, discard into plastic bag and place into  trash. Cleanse the wound with Wound Cleanser prior to applying a clean dressing using gauze sponges, not tissues or cotton balls. Do not scrub or use excessive force. Pat dry using gauze sponges, not tissue or cotton balls. [name], [name] ([id number]) Peri-Wound Care Desitin Maximum Strength Ointment 4 (oz) Discharge Instruction: Apply to peri wound for protection from drainage Topical Primary Dressing Silvercel 4 1/4x 4 1/4 (in/in) Discharge Instruction: Apply Silvercel 4 1/4x 4 1/4 (in/in) as instructed Secondary Dressing Xtrasorb Large 6x9 (in/in) Discharge Instruction: Apply to wound as directed. Do not cut. Secured With Compression Wrap Profore Lite LF 3 Multilayer Compression Bandaging System Discharge Instruction: Apply 3 multi-layer wrap as prescribed. Compression Stockings Add-Ons Wound #8 (Lower Leg) Wound Laterality: Right, Distal Cleanser Wound Cleanser Discharge Instruction: Wash your hands with soap and water. Remove old dressing, discard into plastic bag and place into trash. Cleanse the wound with Wound Cleanser prior to applying a clean dressing using gauze sponges, not tissues or cotton balls. Do not scrub or use excessive force. Pat dry using gauze sponges, not tissue or cotton balls. Peri-Wound Care Desitin Maximum Strength Ointment 4 (oz) Discharge Instruction: Apply to peri wound for protection from drainage Topical Primary Dressing Silvercel 4 1/4x 4 1/4 (in/in) Discharge Instruction: Apply Silvercel 4 1/4x 4 1/4 (in/in) as instructed Secondary Dressing Xtrasorb Large 6x9 (in/in) Discharge Instruction: Apply to  wound as directed. Do not cut. Secured With Compression Wrap Profore Lite LF 3 Multilayer Compression Bandaging System Discharge Instruction: Apply 3 multi-layer wrap as prescribed. Compression Stockings Add-Ons Electronic Signature(s) Signed: 04/27/2021 5:39:25 PM By: Kalman Shan DO Previous Signature: 04/27/2021 4:37:25 PM Version By:  Donnamarie Poag Entered By: Kalman Shan on 04/27/2021 17:19:56 [name] ([id number]) -------------------------------------------------------------------------------- [place] Details Patient Name: [name]. Date of Service: 04/27/2021 3:15 PM Medical Record Number: [id number] Patient Account Number: [id number] Date of Birth/Sex: 05-Jun-1951 (70 y.o. F) Treating RN: Donnamarie Poag Primary Care Provider: Rutherford Guys Other Clinician: Referring Provider: Rutherford Guys Treating Provider/Extender: Yaakov Guthrie in Treatment: 4 Active Inactive Wound/Skin Impairment Nursing Diagnoses: Impaired tissue integrity Goals: Patient/caregiver will verbalize understanding of skin care regimen Date Initiated: 03/30/2021 Date Inactivated: 04/27/2021 Target Resolution Date: 03/30/2021 Goal Status: Met Ulcer/skin breakdown will have a volume reduction of 30% by week 4 Date Initiated: 03/30/2021 Target Resolution Date: 04/30/2021 Goal Status: Active Ulcer/skin breakdown will have a volume reduction of 50% by week 8 Date Initiated: 03/30/2021 Target Resolution Date: 05/30/2021 Goal Status: Active Ulcer/skin breakdown will have a volume reduction of 80% by week 12 Date Initiated: 03/30/2021 Target Resolution Date: 06/30/2021 Goal Status: Active Ulcer/skin breakdown will heal within 14 weeks Date Initiated: 03/30/2021 Target Resolution Date: 07/31/2021 Goal Status: Active Interventions: Assess patient/caregiver ability to obtain necessary supplies Assess patient/caregiver ability to perform ulcer/skin care regimen upon admission and as needed Assess ulceration(s) every visit Treatment Activities: Patient referred to home care : 03/30/2021 Referred to DME provider for dressing supplies : 03/30/2021 Skin care regimen initiated : 03/30/2021 Notes: Electronic Signature(s) Signed: 04/27/2021 4:37:25 PM By: Donnamarie Poag Entered By: Donnamarie Poag on 04/27/2021 16:17:01 [name] ([id number]) -------------------------------------------------------------------------------- Pain Assessment Details Patient Name: [name]. Date of Service: 04/27/2021 3:15 PM Medical Record Number: [id number] Patient Account Number: [id number] Date of Birth/Sex: 1951-03-28 (70 y.o. F) Treating RN: Donnamarie Poag Primary Care Provider: Rutherford Guys Other Clinician: Referring Provider: Rutherford Guys Treating Provider/Extender: Yaakov Guthrie in Treatment: 4 Active Problems Location of Pain Severity and Description of Pain Patient Has Paino Yes Site Locations Pain Location: Pain in Ulcers Rate the pain. Current Pain Level: 3 Pain Management and Medication Current Pain Management: Electronic Signature(s) Signed: 04/27/2021 4:37:25 PM By: Donnamarie Poag Entered By: Donnamarie Poag on 04/27/2021 16:00:40 [name] ([id number]) -------------------------------------------------------------------------------- Patient/Caregiver Education Details Patient Name: [name]. Date of Service: 04/27/2021 3:15 PM Medical Record Number: [id number] Patient Account Number: [id number] Date of Birth/Gender: 01/19/1951 (70 y.o. F) Treating RN: Donnamarie Poag Primary Care Physician: Rutherford Guys Other Clinician: Referring Physician: Rutherford Guys Treating Physician/Extender: Yaakov Guthrie in Treatment: 4 Education Assessment Education Provided To: Patient Education Topics Provided Wound/Skin Impairment: Electronic Signature(s) Signed: 04/27/2021 5:10:13 PM By: Donnamarie Poag Entered By: Donnamarie Poag on 04/27/2021 16:41:01 [name] ([id number]) -------------------------------------------------------------------------------- Wound Assessment Details Patient Name: [name]. Date of Service: 04/27/2021 3:15 PM Medical Record Number: [id number] Patient Account Number: [id number] Date of Birth/Sex: [birth year] (70 y.o. F) Treating RN: Donnamarie Poag Primary Care  Provider: Rutherford Guys Other Clinician: Referring Provider: Rutherford Guys Treating Provider/Extender: Yaakov Guthrie in Treatment: 4 Wound Status Wound Number: 2 Primary Venous Leg Ulcer Etiology: Wound Location: Right Lower Leg Wound Status: Open Wounding Event: Blister Comorbid Cataracts, Arrhythmia, Hypertension, Type II Diabetes, Date Acquired: 03/23/2021 History: Osteoarthritis, Neuropathy Weeks Of Treatment: 4 Clustered Wound: Yes Photos Wound Measurements Length: (cm) 0.6 Width: (cm) 0.6 Depth: (cm) 0.1 Area: (cm)  0.283 Volume: (cm) 0.028 % Reduction in Area: 98.9% % Reduction in Volume: 99.4% Epithelialization: None Tunneling: No Undermining: No Wound Description Classification: Full Thickness Without Exposed Support Structures Exudate Amount: Large Exudate Type: Serous Exudate Color: amber Foul Odor After Cleansing: No Slough/Fibrino Yes Wound Bed Granulation Amount: Small (1-33%) Exposed Structure Granulation Quality: Red, Pink Fascia Exposed: No Necrotic Amount: Large (67-100%) Fat Layer (Subcutaneous Tissue) Exposed: Yes Necrotic Quality: Eschar, Adherent Slough Tendon Exposed: No Muscle Exposed: No Joint Exposed: No Bone Exposed: No Treatment Notes Wound #2 (Lower Leg) Wound Laterality: Right Cleanser Wound Cleanser Discharge Instruction: Wash your hands with soap and water. Remove old dressing, discard into plastic bag and place into trash. Cleanse the wound with Wound Cleanser prior to applying a clean dressing using gauze sponges, not tissues or cotton balls. Do not scrub or use excessive force. Pat dry using gauze sponges, not tissue or cotton balls. ANDRIAN, URBACH ([id number]) Peri-Wound Care Desitin Maximum Strength Ointment 4 (oz) Discharge Instruction: Apply generously to peri wound for protection from drainage Topical Primary Dressing Silvercel 4 1/4x 4 1/4 (in/in) Discharge Instruction: Apply Silvercel 4 1/4x 4 1/4 (in/in) as  instructed Secondary Dressing Xtrasorb Large 6x9 (in/in) Discharge Instruction: Apply to wound as directed. Do not cut. Secured With Compression Wrap Profore Lite LF 3 Multilayer Compression Bandaging System Discharge Instruction: Apply 3 multi-layer wrap as prescribed. Compression Stockings Add-Ons Electronic Signature(s) Signed: 04/27/2021 4:37:25 PM By: Donnamarie Poag Entered By: Donnamarie Poag on 04/27/2021 16:25:49 [name] ([id number]) -------------------------------------------------------------------------------- Wound Assessment Details Patient Name: [name]. Date of Service: 04/27/2021 3:15 PM Medical Record Number: [id number] Patient Account Number: [id number] Date of Birth/Sex: [birth year] (70 y.o. F) Treating RN: Donnamarie Poag Primary Care Provider: Rutherford Guys Other Clinician: Referring Provider: Rutherford Guys Treating Provider/Extender: Yaakov Guthrie in Treatment: 4 Wound Status Wound Number: 3 Primary Diabetic Wound/Ulcer of the Lower Extremity Etiology: Wound Location: Left Toe Great Wound Status: Open Wounding Event: Gradually Appeared Comorbid Cataracts, Arrhythmia, Hypertension, Type II Diabetes, Date Acquired: 02/09/2021 History: Osteoarthritis, Neuropathy Weeks Of Treatment: 4 Clustered Wound: No Photos Wound Measurements Length: (cm) 0.4 Width: (cm) 0.3 Depth: (cm) 0.1 Area: (cm) 0.094 Volume: (cm) 0.009 % Reduction in Area: 0% % Reduction in Volume: 0% Epithelialization: None Tunneling: No Undermining: No Wound Description Classification: Grade 1 Exudate Amount: None Present Foul Odor After Cleansing: No Slough/Fibrino Yes Wound Bed Granulation Amount: None Present (0%) Exposed Structure Necrotic Amount: Large (67-100%) Fascia Exposed: No Necrotic Quality: Eschar Fat Layer (Subcutaneous Tissue) Exposed: No Tendon Exposed: No Muscle Exposed: No Joint Exposed: No Bone Exposed: No Treatment Notes Wound #3 (Toe Great)  Wound Laterality: Left Cleanser Wound Cleanser Discharge Instruction: Wash your hands with soap and water. Remove old dressing, discard into plastic bag and place into trash. Cleanse the wound with Wound Cleanser prior to applying a clean dressing using gauze sponges, not tissues or cotton balls. Do not scrub or use excessive force. Pat dry using gauze sponges, not tissue or cotton balls. Peri-Wound Care [name], [name] ([id number]) Desitin Maximum Strength Ointment 4 (oz) Discharge Instruction: Apply to peri wound for protection from drainage Topical Primary Dressing Silvercel 4 1/4x 4 1/4 (in/in) Discharge Instruction: Apply Silvercel 4 1/4x 4 1/4 (in/in) as instructed Secondary Dressing Xtrasorb Large 6x9 (in/in) Discharge Instruction: Apply to wound as directed. Do not cut. Secured With Compression Wrap Profore Lite LF 3 Multilayer Compression Bandaging System Discharge Instruction: Apply 3 multi-layer wrap as prescribed. Compression Stockings Add-Ons Electronic Signature(s) Signed: 04/27/2021 4:37:25  PM By: Bishop, Joy Entered By: Donnamarie Poag on 04/27/2021 16:08:26 [name] ([id number]) -------------------------------------------------------------------------------- Wound Assessment Details Patient Name: [name]. Date of Service: 04/27/2021 3:15 PM Medical Record Number: [id number] Patient Account Number: [id number] Date of Birth/Sex: 06/26/1951 (70 y.o. F) Treating RN: Donnamarie Poag Primary Care Provider: Rutherford Guys Other Clinician: Referring Provider: Rutherford Guys Treating Provider/Extender: Yaakov Guthrie in Treatment: 4 Wound Status Wound Number: 4 Primary Venous Leg Ulcer Etiology: Wound Location: Left Lower Leg Wound Status: Open Wounding Event: Blister Comorbid Cataracts, Arrhythmia, Hypertension, Type II Diabetes, Date Acquired: 03/30/2021 History: Osteoarthritis, Neuropathy Weeks Of Treatment: 4 Clustered Wound: Yes Photos Wound  Measurements Length: (cm) 5.4 Width: (cm) 1 Depth: (cm) 0.1 Area: (cm) 4.241 Volume: (cm) 0.424 % Reduction in Area: -80% % Reduction in Volume: 10% Tunneling: No Undermining: No Wound Description Classification: Full Thickness Without Exposed Support Structures Exudate Amount: Medium Exudate Type: Serous Exudate Color: amber Foul Odor After Cleansing: No Slough/Fibrino Yes Wound Bed Granulation Amount: Large (67-100%) Granulation Quality: Red, Pink Necrotic Amount: Small (1-33%) Assessment Notes plantar part of foot appearance pic attached with wet dressing removed bottom of foot Treatment Notes Wound #4 (Lower Leg) Wound Laterality: Left Cleanser Wound Cleanser Discharge Instruction: Wash your hands with soap and water. Remove old dressing, discard into plastic bag and place into trash. Cleanse the wound with Wound Cleanser prior to applying a clean dressing using gauze sponges, not tissues or cotton balls. Do not scrub or use excessive force. Pat dry using gauze sponges, not tissue or cotton balls. Peri-Wound Care [name], [name] ([id number]) Desitin Maximum Strength Ointment 4 (oz) Discharge Instruction: Apply to peri wound for protection from drainage Topical Primary Dressing Silvercel 4 1/4x 4 1/4 (in/in) Discharge Instruction: Apply Silvercel 4 1/4x 4 1/4 (in/in) as instructed Secondary Dressing Xtrasorb Large 6x9 (in/in) Discharge Instruction: Apply to wound as directed. Do not cut. Secured With Compression Wrap Profore Lite LF 3 Multilayer Compression Bandaging System Discharge Instruction: Apply 3 multi-layer wrap as prescribed. Compression Stockings Add-Ons Electronic Signature(s) Signed: 04/27/2021 4:37:25 PM By: Donnamarie Poag Entered By: Donnamarie Poag on 04/27/2021 16:14:30 [name] ([id number]) -------------------------------------------------------------------------------- Wound Assessment Details Patient Name: [name]. Date of Service:  04/27/2021 3:15 PM Medical Record Number: [id number] Patient Account Number: [id number] Date of Birth/Sex: [birth year] (70 y.o. F) Treating RN: Donnamarie Poag Primary Care Provider: Rutherford Guys Other Clinician: Referring Provider: Rutherford Guys Treating Provider/Extender: Yaakov Guthrie in Treatment: 4 Wound Status Wound Number: 5 Primary Venous Leg Ulcer Etiology: Wound Location: Right, Medial Lower Leg Wound Status: Open Wounding Event: Gradually Appeared Comorbid Cataracts, Arrhythmia, Hypertension, Type II Diabetes, Date Acquired: 04/18/2021 History: Osteoarthritis, Neuropathy Weeks Of Treatment: 1 Clustered Wound: No Photos Wound Measurements Length: (cm) 2 Width: (cm) 2 Depth: (cm) 0.1 Area: (cm) 3.142 Volume: (cm) 0.314 % Reduction in Area: -344.4% % Reduction in Volume: -122.7% Tunneling: No Undermining: No Wound Description Classification: Full Thickness Without Exposed Support Structu Exudate Amount: Large Exudate Type: Serous Exudate Color: amber res Foul Odor After Cleansing: No Slough/Fibrino Yes Wound Bed Granulation Amount: Large (67-100%) Granulation Quality: Red, Pink Necrotic Amount: Small (1-33%) Necrotic Quality: Adherent Therapist, music) Signed: 04/27/2021 4:37:25 PM By: Donnamarie Poag Entered By: Donnamarie Poag on 04/27/2021 16:11:07 [name] ([id number]) -------------------------------------------------------------------------------- Wound Assessment Details Patient Name: [name]. Date of Service: 04/27/2021 3:15 PM Medical Record Number: [id number] Patient Account Number: [id number] Date of Birth/Sex: [birth year] (70 y.o. F) Treating RN: Donnamarie Poag Primary Care Provider: Rutherford Guys Other  Clinician: Referring Provider: Rutherford Guys Treating Provider/Extender: Yaakov Guthrie in Treatment: 4 Wound Status Wound Number: 6 Primary Venous Leg Ulcer Etiology: Wound Location: Left, Lateral Lower Leg Wound  Status: Healed - Epithelialized Wounding Event: Gradually Appeared Comorbid Cataracts, Arrhythmia, Hypertension, Type II Diabetes, Date Acquired: 04/11/2021 History: Osteoarthritis, Neuropathy Weeks Of Treatment: 1 Clustered Wound: No Photos Wound Measurements Length: (cm) Width: (cm) Depth: (cm) Area: (cm) Volume: (cm) 0 % Reduction in Area: 100% 0 % Reduction in Volume: 100% 0 0 0 Wound Description Classification: Partial Thickness Exudate Amount: None Present Foul Odor After Cleansing: No Slough/Fibrino No Wound Bed Granulation Amount: None Present (0%) Exposed Structure Necrotic Amount: None Present (0%) Fat Layer (Subcutaneous Tissue) Exposed: Yes Electronic Signature(s) Signed: 04/27/2021 4:37:25 PM By: Donnamarie Poag Entered By: Donnamarie Poag on 04/27/2021 16:12:04 [name] ([id number]) -------------------------------------------------------------------------------- Wound Assessment Details Patient Name: [name]. Date of Service: 04/27/2021 3:15 PM Medical Record Number: [id number] Patient Account Number: [id number] Date of Birth/Sex: Jun 17, 1951 (70 y.o. F) Treating RN: Donnamarie Poag Primary Care Provider: Rutherford Guys Other Clinician: Referring Provider: Rutherford Guys Treating Provider/Extender: Yaakov Guthrie in Treatment: 4 Wound Status Wound Number: 7 Primary Diabetic Wound/Ulcer of the Lower Extremity Etiology: Wound Location: Left, Dorsal Foot Wound Status: Open Wounding Event: Gradually Appeared Comorbid Cataracts, Arrhythmia, Hypertension, Type II Diabetes, Date Acquired: 04/18/2021 History: Osteoarthritis, Neuropathy Weeks Of Treatment: 1 Clustered Wound: No Photos Wound Measurements Length: (cm) 0.1 Width: (cm) 0.1 Depth: (cm) 0.1 Area: (cm) 0.008 Volume: (cm) 0.001 % Reduction in Area: 91.5% % Reduction in Volume: 88.9% Epithelialization: None Tunneling: No Undermining: No Wound Description Classification: Grade  1 Wound Margin: Flat and Intact Exudate Amount: None Present Foul Odor After Cleansing: No Slough/Fibrino Yes Wound Bed Granulation Amount: Small (1-33%) Exposed Structure Granulation Quality: Pink Fascia Exposed: No Necrotic Amount: None Present (0%) Fat Layer (Subcutaneous Tissue) Exposed: Yes Tendon Exposed: No Muscle Exposed: No Joint Exposed: No Bone Exposed: No Treatment Notes Wound #7 (Foot) Wound Laterality: Dorsal, Left Cleanser Wound Cleanser Discharge Instruction: Wash your hands with soap and water. Remove old dressing, discard into plastic bag and place into trash. Cleanse the wound with Wound Cleanser prior to applying a clean dressing using gauze sponges, not tissues or cotton balls. Do not scrub or use excessive force. Pat dry using gauze sponges, not tissue or cotton balls. Peri-Wound Care [name], [name] ([id number]) Desitin Maximum Strength Ointment 4 (oz) Discharge Instruction: Apply to peri wound for protection from drainage Topical Primary Dressing Silvercel 4 1/4x 4 1/4 (in/in) Discharge Instruction: Apply Silvercel 4 1/4x 4 1/4 (in/in) as instructed Secondary Dressing Xtrasorb Large 6x9 (in/in) Discharge Instruction: Apply to wound as directed. Do not cut. Secured With Compression Wrap Profore Lite LF 3 Multilayer Compression Bandaging System Discharge Instruction: Apply 3 multi-layer wrap as prescribed. Compression Stockings Add-Ons Electronic Signature(s) Signed: 04/27/2021 4:37:25 PM By: Donnamarie Poag Entered By: Donnamarie Poag on 04/27/2021 16:13:29 [name] ([id number]) -------------------------------------------------------------------------------- Wound Assessment Details Patient Name: [name]. Date of Service: 04/27/2021 3:15 PM Medical Record Number: [id number] Patient Account Number: [id number] Date of Birth/Sex: [birth year] (70 y.o. F) Treating RN: Donnamarie Poag Primary Care Provider: Rutherford Guys Other Clinician: Referring  Provider: Rutherford Guys Treating Provider/Extender: Yaakov Guthrie in Treatment: 4 Wound Status Wound Number: 8 Primary Venous Leg Ulcer Etiology: Wound Location: Right, Distal Lower Leg Wound Status: Open Wounding Event: Gradually Appeared Comorbid Cataracts, Arrhythmia, Hypertension, Type II Diabetes, Date Acquired: 04/27/2021 History: Osteoarthritis, Neuropathy Weeks Of Treatment: 0 Clustered Wound:  No Photos Wound Measurements Length: (cm) 0.9 Width: (cm) 0.8 Depth: (cm) 0.1 Area: (cm) 0.565 Volume: (cm) 0.057 % Reduction in Area: % Reduction in Volume: Tunneling: No Undermining: No Wound Description Classification: Full Thickness Without Exposed Support Structures Exudate Amount: Medium Exudate Type: Serous Exudate Color: amber Foul Odor After Cleansing: No Slough/Fibrino No Wound Bed Granulation Amount: Large (67-100%) Granulation Quality: Red Necrotic Amount: None Present (0%) Treatment Notes Wound #8 (Lower Leg) Wound Laterality: Right, Distal Cleanser Wound Cleanser Discharge Instruction: Wash your hands with soap and water. Remove old dressing, discard into plastic bag and place into trash. Cleanse the wound with Wound Cleanser prior to applying a clean dressing using gauze sponges, not tissues or cotton balls. Do not scrub or use excessive force. Pat dry using gauze sponges, not tissue or cotton balls. Peri-Wound Care Desitin Maximum Strength Ointment 4 (oz) Discharge Instruction: Apply to peri wound for protection from drainage SHAMICA, MOREE. ([id number]) Topical Primary Dressing Silvercel 4 1/4x 4 1/4 (in/in) Discharge Instruction: Apply Silvercel 4 1/4x 4 1/4 (in/in) as instructed Secondary Dressing Xtrasorb Large 6x9 (in/in) Discharge Instruction: Apply to wound as directed. Do not cut. Secured With Compression Wrap Profore Lite LF 3 Multilayer Compression Bandaging System Discharge Instruction: Apply 3 multi-layer wrap as  prescribed. Compression Stockings Add-Ons Electronic Signature(s) Signed: 04/27/2021 4:37:25 PM By: Donnamarie Poag Entered By: Donnamarie Poag on 04/27/2021 16:27:44 [name] ([id number]) -------------------------------------------------------------------------------- [facility] Details Patient Name: [name]. Date of Service: 04/27/2021 3:15 PM Medical Record Number: [id number] Patient Account Number: [id number] Date of Birth/Sex: [birth year] (70 y.o. F) Treating RN: Donnamarie Poag Primary Care Verla Bryngelson: Rutherford Guys Other Clinician: Referring Makeisha Jentsch: Rutherford Guys Treating Jude Naclerio/Extender: Yaakov Guthrie in Treatment: 4 Vital Signs Time Taken: 15:55 Temperature (F): 98.2 Height (in): 66 Pulse (bpm): 87 Weight (lbs): 332 Respiratory Rate (breaths/min): 016 Body Mass Index (BMI): 53.6 Blood Pressure (mmHg): 126/88 Reference Range: 80 - 120 mg / dl Electronic Signature(s) Signed: 04/27/2021 4:37:25 PM By: Donnamarie Poag Entered ByDonnamarie Poag on 04/27/2021 16:00:28

## 2021-04-28 NOTE — Progress Notes (Signed)
[name], [name] ([id number]) Visit Report for 04/27/2021 Chief Complaint Document Details Patient Name: [name], [name]. Date of Service: 04/27/2021 3:15 PM Medical Record Number: [id number] Patient Account Number: [id number] Date of Birth/Sex: 08/14/1950 (70 y.o. F) Treating RN: [name] Primary Care Provider: Rutherford [name] Other Clinician: Referring Provider: Rutherford [name] Treating Provider/Extender: [name] in Treatment: 4 Information Obtained from: Patient Chief Complaint Bilateral lower extremity wounds Electronic Signature(s) Signed: 04/27/2021 5:39:25 PM By: Miranda Shan DO Entered By: [name] on 04/27/2021 17:20:10 [name] ([id number]) -------------------------------------------------------------------------------- Debridement Details Patient Name: [name]. Date of Service: 04/27/2021 3:15 PM Medical Record Number: [id number] Patient Account Number: [id number] Date of Birth/Sex: 01/18/1951 (70 y.o. F) Treating RN: [name] Primary Care Provider: Rutherford [name] Other Clinician: Referring Provider: Rutherford [name] Treating Provider/Extender: [name] in Treatment: 4 Debridement Performed for Wound #3 Left Toe Great Assessment: Performed By: Physician Miranda Shan, MD Debridement Type: Debridement Severity of Tissue Pre Debridement: Fat layer exposed Level of Consciousness (Pre- Awake and Alert procedure): Pre-procedure Verification/Time Out Yes - 16:20 Taken: Start Time: 16:20 Pain Control: Lidocaine Total Area Debrided (L x W): 0.4 (cm) x 0.3 (cm) = 0.12 (cm) Tissue and other material Viable, Non-Viable, Slough, Subcutaneous, Slough debrided: Level: Skin/Subcutaneous Tissue Debridement Description: Excisional Instrument: Curette Bleeding: Minimum Hemostasis Achieved: Pressure Response to Treatment: Procedure was tolerated well Level of Consciousness (Post- Awake and Alert procedure): Post  Debridement Measurements of Total Wound Length: (cm) 0.4 Width: (cm) 0.3 Depth: (cm) 0.1 Volume: (cm) 0.009 Character of Wound/Ulcer Post Debridement: Improved Severity of Tissue Post Debridement: Fat layer exposed Post Procedure Diagnosis Same as Pre-procedure Electronic Signature(s) Signed: 04/27/2021 4:37:25 PM By: [name] Signed: 04/27/2021 5:39:25 PM By: Miranda Shan DO Entered By: [name] on 04/27/2021 16:24:06 [name] ([id number]) -------------------------------------------------------------------------------- HPI Details Patient Name: [name]. Date of Service: 04/27/2021 3:15 PM Medical Record Number: [id number] Patient Account Number: [id number] Date of Birth/Sex: Jun 13, 1951 (70 y.o. F) Treating RN: [name] Primary Care Provider: Rutherford [name] Other Clinician: Referring Provider: Rutherford [name] Treating Provider/Extender: [name] in Treatment: 4 History of Present Illness HPI Description: Admission 10/5 [name] is a 70 year old female with a past medical history of hypertension, venous insufficiency, chronic diastolic heart failure and insulin-dependent type 2 diabetes that presents To the clinic for bilateral lower extremity wounds. She states that her right lower extremity developed a blister 1 week ago and she has been keeping the area covered with a bandaid. She states that when the Band-Aid came off it tore her skin. She subsequently developed pain, increased warmth and erythema to the right lower extremity. She denies purulent drainage. She has also developed an open wound to the left lower extremity that recently developed. She also has a small open wound to the proximal portion of the left great toe. This has been present since August and was evaluated by podiatry. It has not healed and she has not followed up with podiatry for this issue. 10/12; patient presents for follow-up. She states she has 2 days left on her  antibiotics. She is tolerated this well. She reports improvement to her right lower extremity symptoms. She denies signs of infection. She has home health. 10/26; patient presents for follow-up. She has no issues or complaints today. The compression wraps slid down to her shin. These were placed on Monday by home health. She has a couple of new open wounds due to this. She denies signs of infection. 11/2; patient  presents for follow-up. The compression wraps had slid down to her shin again. The wraps taken off today do not appear to be appropriate material for 3 layer compression wrap. She currently denies signs of infection. Electronic Signature(s) Signed: 04/27/2021 5:39:25 PM By: Miranda Shan DO Entered By: [name] on 04/27/2021 17:21:14 [name] ([id number]) -------------------------------------------------------------------------------- Physical Exam Details Patient Name: [name]. Date of Service: 04/27/2021 3:15 PM Medical Record Number: [id number] Patient Account Number: [id number] Date of Birth/Sex: [birth year] (70 y.o. F) Treating RN: [name] Primary Care Provider: Rutherford [name] Other Clinician: Referring Provider: Rutherford [name] Treating Provider/Extender: [name] in Treatment: 4 Constitutional . Cardiovascular . Psychiatric . Notes Right lower extremity: Scattered open wounds with granulation tissue present. No signs of infection. Left lower extremity: To the left lateral leg and dorsal left foot there are open wounds limited to skin breakdown. No signs of infection. Weeping To the lower extremities bilaterally. Maceration noted to the left plantar foot. Electronic Signature(s) Signed: 04/27/2021 5:39:25 PM By: Miranda Shan DO Entered By: [name] on 04/27/2021 17:22:17 [name] ([id number]) -------------------------------------------------------------------------------- Physician Orders Details Patient Name:  [name]. Date of Service: 04/27/2021 3:15 PM Medical Record Number: [id number] Patient Account Number: [id number] Date of Birth/Sex: [birth year] (70 y.o. F) Treating RN: [name] Primary Care Provider: Rutherford [name] Other Clinician: Referring Provider: Rutherford [name] Treating Provider/Extender: [name] in Treatment: 4 Verbal / Phone Orders: No Diagnosis Coding Follow-up Appointments Wound #2 Right Lower Leg o Return Appointment in 1 week. o Nurse Visit as needed Wound #3 Left Toe Great o Return Appointment in 1 week. o Nurse Visit as needed o Return Appointment in 2 weeks. o Nurse Visit as needed Wound #4 Left Lower Leg o Return Appointment in 1 week. o Nurse Visit as needed o Return Appointment in 2 weeks. o Nurse Visit as needed Wound #5 Right,Medial Lower Leg o Return Appointment in 1 week. o Nurse Visit as needed o Return Appointment in 2 weeks. o Nurse Visit as needed Wound #7 Left,Dorsal Foot o Return Appointment in 1 week. o Nurse Visit as needed o Return Appointment in 2 weeks. o Nurse Visit as needed Wound #8 Right,Distal Lower Leg o Return Appointment in 1 week. o Nurse Visit as needed o Return Appointment in 2 weeks. o Nurse Visit as needed [place] for wound care. May utilize formulary equivalent dressing for wound treatment orders unless otherwise specified. Home Health Nurse may visit [facility] to address patientos wound care needs. o Scheduled days for dressing changes to be completed; exception, patient has scheduled wound care visit that day. o **Please direct any NON-WOUND related issues/requests for orders to patient's Primary Care Physician. **If current dressing causes regression in wound condition, may D/C ordered dressing product/s and apply Normal Saline Moist Dressing daily until next [place] or Other MD  appointment. **Notify [facility] of regression in wound condition at [phone]. Bathing/ Shower/ Hygiene o May shower with wound dressing protected with water repellent cover or cast protector. o No tub bath. Edema Control - Lymphedema / Segmental Compressive Device / Other o Optional: One layer of unna paste to top of compression wrap (to act as an anchor). - PLEASE USE THIS TO ANCHOR WRAPS AS THEY KEEP FALLING o Elevate leg(s) parallel to the floor when sitting. o DO YOUR BEST to sleep in the bed at night. DO NOT sleep in your recliner. Long  hours of sitting in a recliner leads to swelling of the legs and/or potential wounds on your backside. Additional Orders / Instructions o Follow Nutritious Diet and Increase Protein Intake Medications-Please add to medication list. REIZEL, CALZADA ([id number]) o Other: - Increase your Lasix (fluid pill) to 60mg  every other day and 40mg  every other day (rotate back and forth) and when you take extra Lasix take 2 of your Potassium pills instead of 1. Follow up with Primary Care doctor about your meds or Cardiology (Lasix and Potassium prescriber) Wound Treatment Wound #2 - Lower Leg Wound Laterality: Right Cleanser: Wound Cleanser 3 x Per Week/30 Days Discharge Instructions: Wash your hands with soap and water. Remove old dressing, discard into plastic bag and place into trash. Cleanse the wound with Wound Cleanser prior to applying a clean dressing using gauze sponges, not tissues or cotton balls. Do not scrub or use excessive force. Pat dry using gauze sponges, not tissue or cotton balls. Peri-Wound Care: Desitin Maximum Strength Ointment 4 (oz) 3 x Per Week/30 Days Discharge Instructions: Apply generously to peri wound for protection from drainage Primary Dressing: Silvercel 4 1/4x 4 1/4 (in/in) 3 x Per Week/30 Days Discharge Instructions: Apply Silvercel 4 1/4x 4 1/4 (in/in) as instructed Secondary Dressing: Xtrasorb  Large 6x9 (in/in) (Generic) 3 x Per Week/30 Days Discharge Instructions: Apply to wound as directed. Do not cut. Compression Wrap: Profore Lite LF 3 Multilayer Compression Bandaging System (Generic) 3 x Per Week/30 Days Discharge Instructions: Apply 3 multi-layer wrap as prescribed. Wound #3 - Toe Great Wound Laterality: Left Cleanser: Wound Cleanser 3 x Per Week/30 Days Discharge Instructions: Wash your hands with soap and water. Remove old dressing, discard into plastic bag and place into trash. Cleanse the wound with Wound Cleanser prior to applying a clean dressing using gauze sponges, not tissues or cotton balls. Do not scrub or use excessive force. Pat dry using gauze sponges, not tissue or cotton balls. Peri-Wound Care: Desitin Maximum Strength Ointment 4 (oz) 3 x Per Week/30 Days Discharge Instructions: Apply to peri wound for protection from drainage Primary Dressing: Silvercel 4 1/4x 4 1/4 (in/in) 3 x Per Week/30 Days Discharge Instructions: Apply Silvercel 4 1/4x 4 1/4 (in/in) as instructed Secondary Dressing: Xtrasorb Large 6x9 (in/in) (Generic) 3 x Per Week/30 Days Discharge Instructions: Apply to wound as directed. Do not cut. Compression Wrap: Profore Lite LF 3 Multilayer Compression Bandaging System (Generic) 3 x Per Week/30 Days Discharge Instructions: Apply 3 multi-layer wrap as prescribed. Wound #4 - Lower Leg Wound Laterality: Left Cleanser: Wound Cleanser 3 x Per Week/30 Days Discharge Instructions: Wash your hands with soap and water. Remove old dressing, discard into plastic bag and place into trash. Cleanse the wound with Wound Cleanser prior to applying a clean dressing using gauze sponges, not tissues or cotton balls. Do not scrub or use excessive force. Pat dry using gauze sponges, not tissue or cotton balls. Peri-Wound Care: Desitin Maximum Strength Ointment 4 (oz) 3 x Per Week/30 Days Discharge Instructions: Apply to peri wound for protection from drainage Primary  Dressing: Silvercel 4 1/4x 4 1/4 (in/in) 3 x Per Week/30 Days Discharge Instructions: Apply Silvercel 4 1/4x 4 1/4 (in/in) as instructed Secondary Dressing: Xtrasorb Large 6x9 (in/in) (Generic) 3 x Per Week/30 Days Discharge Instructions: Apply to wound as directed. Do not cut. Compression Wrap: Profore Lite LF 3 Multilayer Compression Bandaging System (Generic) 3 x Per Week/30 Days Discharge Instructions: Apply 3 multi-layer wrap as prescribed. Wound #5 - Lower Leg Wound  Laterality: Right, Medial Cleanser: Wound Cleanser 3 x Per Week/30 Days Discharge Instructions: Wash your hands with soap and water. Remove old dressing, discard into plastic bag and place into trash. Cleanse the wound with Wound Cleanser prior to applying a clean dressing using gauze sponges, not tissues or cotton balls. Do not scrub or use excessive force. Pat dry using gauze sponges, not tissue or cotton balls. Peri-Wound Care: Desitin Maximum Strength Ointment 4 (oz) 3 x Per Week/30 Days Discharge Instructions: Apply to peri wound for protection from drainage [name], [name]. ([id number]) Primary Dressing: Silvercel 4 1/4x 4 1/4 (in/in) 3 x Per Week/30 Days Discharge Instructions: Apply Silvercel 4 1/4x 4 1/4 (in/in) as instructed Secondary Dressing: Xtrasorb Large 6x9 (in/in) (Generic) 3 x Per Week/30 Days Discharge Instructions: Apply to wound as directed. Do not cut. Compression Wrap: Profore Lite LF 3 Multilayer Compression Bandaging System (Generic) 3 x Per Week/30 Days Discharge Instructions: Apply 3 multi-layer wrap as prescribed. Wound #7 - Foot Wound Laterality: Dorsal, Left Cleanser: Wound Cleanser 3 x Per Week/30 Days Discharge Instructions: Wash your hands with soap and water. Remove old dressing, discard into plastic bag and place into trash. Cleanse the wound with Wound Cleanser prior to applying a clean dressing using gauze sponges, not tissues or cotton balls. Do not scrub or use excessive force. Pat dry  using gauze sponges, not tissue or cotton balls. Peri-Wound Care: Desitin Maximum Strength Ointment 4 (oz) 3 x Per Week/30 Days Discharge Instructions: Apply to peri wound for protection from drainage Primary Dressing: Silvercel 4 1/4x 4 1/4 (in/in) 3 x Per Week/30 Days Discharge Instructions: Apply Silvercel 4 1/4x 4 1/4 (in/in) as instructed Secondary Dressing: Xtrasorb Large 6x9 (in/in) (Generic) 3 x Per Week/30 Days Discharge Instructions: Apply to wound as directed. Do not cut. Compression Wrap: Profore Lite LF 3 Multilayer Compression Bandaging System (Generic) 3 x Per Week/30 Days Discharge Instructions: Apply 3 multi-layer wrap as prescribed. Wound #8 - Lower Leg Wound Laterality: Right, Distal Cleanser: Wound Cleanser 3 x Per Week/30 Days Discharge Instructions: Wash your hands with soap and water. Remove old dressing, discard into plastic bag and place into trash. Cleanse the wound with Wound Cleanser prior to applying a clean dressing using gauze sponges, not tissues or cotton balls. Do not scrub or use excessive force. Pat dry using gauze sponges, not tissue or cotton balls. Peri-Wound Care: Desitin Maximum Strength Ointment 4 (oz) 3 x Per Week/30 Days Discharge Instructions: Apply to peri wound for protection from drainage Primary Dressing: Silvercel 4 1/4x 4 1/4 (in/in) 3 x Per Week/30 Days Discharge Instructions: Apply Silvercel 4 1/4x 4 1/4 (in/in) as instructed Secondary Dressing: Xtrasorb Large 6x9 (in/in) (Generic) 3 x Per Week/30 Days Discharge Instructions: Apply to wound as directed. Do not cut. Compression Wrap: Profore Lite LF 3 Multilayer Compression Bandaging System (Generic) 3 x Per Week/30 Days Discharge Instructions: Apply 3 multi-layer wrap as prescribed. Consults o Vascular - [facility] will call you for Reflux Study Notes Please make sure extra absorbent dressings are used and not ABD pads which are not absorbent enough. Please anchor the  wraps at the top with unna paste to hold them up Electronic Signature(s) Signed: 04/27/2021 5:10:13 PM By: [name] Signed: 04/27/2021 5:39:25 PM By: Miranda Shan DO Previous Signature: 04/27/2021 4:38:22 PM Version By: [name] Previous Signature: 04/27/2021 4:37:25 PM Version By: [name] Entered By: [name] on 04/27/2021 16:39:17 [name] ([id number]) -------------------------------------------------------------------------------- Problem List Details Patient Name: [name]  F. Date of Service: 04/27/2021 3:15 PM Medical Record Number: [id number] Patient Account Number: [id number] Date of Birth/Sex: 1950-07-21 (70 y.o. F) Treating RN: [name] Primary Care Provider: Rutherford [name] Other Clinician: Referring Provider: Rutherford [name] Treating Provider/Extender: [name] in Treatment: 4 Active Problems ICD-10 Encounter Code Description Active Date MDM Diagnosis I87.331 Chronic venous hypertension (idiopathic) with ulcer and inflammation of 03/30/2021 No Yes right lower extremity I87.332 Chronic venous hypertension (idiopathic) with ulcer and inflammation of 03/30/2021 No Yes left lower extremity S91.302D Unspecified open wound, left foot, subsequent encounter 03/30/2021 No Yes Inactive Problems Resolved Problems Electronic Signature(s) Signed: 04/27/2021 5:39:25 PM By: Miranda Shan DO Entered By: [name] on 04/27/2021 17:19:43 [name] ([id number]) -------------------------------------------------------------------------------- Progress Note Details Patient Name: [name]. Date of Service: 04/27/2021 3:15 PM Medical Record Number: [id number] Patient Account Number: [id number] Date of Birth/Sex: 05-27-1951 (71 y.o. F) Treating RN: [name] Primary Care Provider: Rutherford [name] Other Clinician: Referring Provider: Rutherford [name] Treating Provider/Extender: [name] in Treatment: 4 Subjective Chief  Complaint Information obtained from Patient Bilateral lower extremity wounds History of Present Illness (HPI) Admission 10/5 Ms. Jashley Yellin is a 70 year old female with a past medical history of hypertension, venous insufficiency, chronic diastolic heart failure and insulin-dependent type 2 diabetes that presents To the clinic for bilateral lower extremity wounds. She states that her right lower extremity developed a blister 1 week ago and she has been keeping the area covered with a bandaid. She states that when the Band-Aid came off it tore her skin. She subsequently developed pain, increased warmth and erythema to the right lower extremity. She denies purulent drainage. She has also developed an open wound to the left lower extremity that recently developed. She also has a small open wound to the proximal portion of the left great toe. This has been present since August and was evaluated by podiatry. It has not healed and she has not followed up with podiatry for this issue. 10/12; patient presents for follow-up. She states she has 2 days left on her antibiotics. She is tolerated this well. She reports improvement to her right lower extremity symptoms. She denies signs of infection. She has home health. 10/26; patient presents for follow-up. She has no issues or complaints today. The compression wraps slid down to her shin. These were placed on Monday by home health. She has a couple of new open wounds due to this. She denies signs of infection. 11/2; patient presents for follow-up. The compression wraps had slid down to her shin again. The wraps taken off today do not appear to be appropriate material for 3 layer compression wrap. She currently denies signs of infection. Patient History Information obtained from Patient. Family History Cancer - Maternal Grandparents,Paternal Grandparents,Mother,Father, Diabetes - Maternal Grandparents,Paternal Grandparents,Mother,Siblings, Heart Disease -  Father,Siblings, Hypertension - Siblings,Father,Mother, No family history of Hereditary Spherocytosis, Kidney Disease, Lung Disease, Seizures, Stroke, Thyroid Problems, Tuberculosis. Social History Never smoker, Marital Status - Single, Alcohol Use - Never, Drug Use - No History, Caffeine Use - Never. Medical History Eyes Patient has history of Cataracts Denies history of Glaucoma, Optic Neuritis Ear/Nose/Mouth/Throat Denies history of Chronic sinus problems/congestion, Middle ear problems Hematologic/Lymphatic Denies history of Anemia, Hemophilia, Human Immunodeficiency Virus, Lymphedema, Sickle Cell Disease Respiratory Denies history of Aspiration, Asthma, Chronic Obstructive Pulmonary Disease (COPD), Pneumothorax, Sleep Apnea, Tuberculosis Cardiovascular Patient has history of Arrhythmia - Afib, Hypertension Denies history of Angina, Congestive Heart Failure, Coronary Artery Disease, Deep Vein Thrombosis, Hypotension, Myocardial Infarction, Peripheral  Arterial Disease, Peripheral Venous Disease, Phlebitis, Vasculitis Gastrointestinal Denies history of Cirrhosis , Colitis, Crohn s, Hepatitis A, Hepatitis B, Hepatitis C Endocrine Patient has history of Type II Diabetes Denies history of Type I Diabetes Genitourinary Denies history of End Stage Renal Disease Immunological Denies history of Lupus Erythematosus, Raynaud s, Scleroderma Integumentary (Skin) Denies history of History of Burn, History of pressure wounds Musculoskeletal Patient has history of Osteoarthritis Denies history of Gout, Rheumatoid Arthritis, Osteomyelitis Neurologic Patient has history of Neuropathy - fingers and feet [name], [name] ([id number]) Denies history of Dementia, Quadriplegia, Paraplegia, Seizure Disorder Oncologic Denies history of Received Chemotherapy, Received Radiation Psychiatric Denies history of Anorexia/bulimia, Confinement Anxiety Medical And Surgical History  Notes Cardiovascular "heart valve problem" Objective Constitutional Vitals Time Taken: 3:55 PM, Height: 66 in, Weight: 332 lbs, BMI: 53.6, Temperature: 98.2 F, Pulse: 87 bpm, Respiratory Rate: 016 breaths/min, Blood Pressure: 126/88 mmHg. General Notes: Right lower extremity: Scattered open wounds with granulation tissue present. No signs of infection. Left lower extremity: To the left lateral leg and dorsal left foot there are open wounds limited to skin breakdown. No signs of infection. Weeping To the lower extremities bilaterally. Maceration noted to the left plantar foot. Integumentary (Hair, Skin) Wound #2 status is Open. Original cause of wound was Blister. The date acquired was: 03/23/2021. The wound has been in treatment 4 weeks. The wound is located on the Right Lower Leg. The wound measures 0.6cm length x 0.6cm width x 0.1cm depth; 0.283cm^2 area and 0.028cm^3 volume. There is Fat Layer (Subcutaneous Tissue) exposed. There is no tunneling or undermining noted. There is a large amount of serous drainage noted. There is small (1-33%) red, pink granulation within the wound bed. There is a large (67-100%) amount of necrotic tissue within the wound bed including Eschar and Adherent Slough. Wound #3 status is Open. Original cause of wound was Gradually Appeared. The date acquired was: 02/09/2021. The wound has been in treatment 4 weeks. The wound is located on the Left Toe Great. The wound measures 0.4cm length x 0.3cm width x 0.1cm depth; 0.094cm^2 area and 0.009cm^3 volume. There is no tunneling or undermining noted. There is a none present amount of drainage noted. There is no granulation within the wound bed. There is a large (67-100%) amount of necrotic tissue within the wound bed including Eschar. Wound #4 status is Open. Original cause of wound was Blister. The date acquired was: 03/30/2021. The wound has been in treatment 4 weeks. The wound is located on the Left Lower Leg. The wound  measures 5.4cm length x 1cm width x 0.1cm depth; 4.241cm^2 area and 0.424cm^3 volume. There is no tunneling or undermining noted. There is a medium amount of serous drainage noted. There is large (67-100%) red, pink granulation within the wound bed. There is a small (1-33%) amount of necrotic tissue within the wound bed. General Notes: plantar part of foot appearance pic attached with wet dressing removed bottom of foot Wound #5 status is Open. Original cause of wound was Gradually Appeared. The date acquired was: 04/18/2021. The wound has been in treatment 1 weeks. The wound is located on the Right,Medial Lower Leg. The wound measures 2cm length x 2cm width x 0.1cm depth; 3.142cm^2 area and 0.314cm^3 volume. There is no tunneling or undermining noted. There is a large amount of serous drainage noted. There is large (67-100%) red, pink granulation within the wound bed. There is a small (1-33%) amount of necrotic tissue within the wound bed including Adherent Slough. Wound #  6 status is Healed - Epithelialized. Original cause of wound was Gradually Appeared. The date acquired was: 04/11/2021. The wound has been in treatment 1 weeks. The wound is located on the Left,Lateral Lower Leg. The wound measures 0cm length x 0cm width x 0cm depth; 0cm^2 area and 0cm^3 volume. There is Fat Layer (Subcutaneous Tissue) exposed. There is a none present amount of drainage noted. There is no granulation within the wound bed. There is no necrotic tissue within the wound bed. Wound #7 status is Open. Original cause of wound was Gradually Appeared. The date acquired was: 04/18/2021. The wound has been in treatment 1 weeks. The wound is located on the Left,Dorsal Foot. The wound measures 0.1cm length x 0.1cm width x 0.1cm depth; 0.008cm^2 area and 0.001cm^3 volume. There is Fat Layer (Subcutaneous Tissue) exposed. There is no tunneling or undermining noted. There is a none present amount of drainage noted. The wound  margin is flat and intact. There is small (1-33%) pink granulation within the wound bed. There is no necrotic tissue within the wound bed. Wound #8 status is Open. Original cause of wound was Gradually Appeared. The date acquired was: 04/27/2021. The wound is located on the Right,Distal Lower Leg. The wound measures 0.9cm length x 0.8cm width x 0.1cm depth; 0.565cm^2 area and 0.057cm^3 volume. There is no tunneling or undermining noted. There is a medium amount of serous drainage noted. There is large (67-100%) red granulation within the wound bed. There is no necrotic tissue within the wound bed. Assessment [name], [name] ([id number]) Active Problems ICD-10 Chronic venous hypertension (idiopathic) with ulcer and inflammation of right lower extremity Chronic venous hypertension (idiopathic) with ulcer and inflammation of left lower extremity Unspecified open wound, left foot, subsequent encounter Patient's wounds are stable. She has excessive weeping on exam. Per intake nurse home health has been short on materials and they have been improvicing with the materials they have. I recommended following up in our clinic to have the wraps changed twice weekly however patient states she cannot make it in due to transportation. We will reach out to home health to assure they have the supplies to help keep the wrap in place as well as proper material. For now I recommended doing silver alginate under 3 layer compression bilaterally. I did ask her to increase her diuretic. She takes 40 mg of furosemide daily. I asked her to take an extra half tablet every other day and an extra potassium on that day Just for the next week. No signs of infection on exam. For the toe wound I debrided nonviable tissue. I recommended silver alginate to this as well. 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 There was a Excisional Skin/Subcutaneous Tissue Debridement with a total area of 0.12 sq cm performed by Miranda Shan, MD. With the following instrument(s): Curette to remove Viable and Non-Viable tissue/material. Material removed includes Subcutaneous Tissue and Slough and after achieving pain control using Lidocaine. A time out was conducted at 16:20, prior to the start of the procedure. A Minimum amount of bleeding was controlled with Pressure. The procedure was tolerated well. Post Debridement Measurements: 0.4cm length x 0.3cm width x 0.1cm depth; 0.009cm^3 volume. Character of Wound/Ulcer Post Debridement is improved. Severity of Tissue Post Debridement is: Fat layer exposed. Post procedure Diagnosis Wound #3: Same as Pre-Procedure Wound #4  Pre-procedure diagnosis of Wound #4 is a Venous Leg Ulcer located on the Left Lower Leg . There was a Three Layer Compression Therapy Procedure by Miranda Poag, RN. Post procedure Diagnosis Wound #4: Same as Pre-Procedure Wound #5 Pre-procedure diagnosis of Wound #5 is a Venous Leg Ulcer located on the Right,Medial Lower Leg . There was a Three Layer Compression Therapy Procedure by Miranda Poag, RN. Post procedure Diagnosis Wound #5: Same as Pre-Procedure Plan Follow-up Appointments: Wound #2 Right Lower Leg: Return Appointment in 1 week. Nurse Visit as needed Wound #3 Left Toe Great: Return Appointment in 1 week. Nurse Visit as needed Return Appointment in 2 weeks. Nurse Visit as needed Wound #4 Left Lower Leg: Return Appointment in 1 week. Nurse Visit as needed Return Appointment in 2 weeks. Nurse Visit as needed Wound #5 Right,Medial Lower Leg: Return Appointment in 1 week. Nurse Visit as needed Return Appointment in 2 weeks. Nurse Visit as needed Wound #7 Left,Dorsal Foot: Return Appointment in 1 week. Nurse Visit as needed Return Appointment in 2 weeks. Nurse Visit as  needed Wound #8 Right,Distal Lower Leg: [name], [name] ([id number]) Return Appointment in 1 week. Nurse Visit as needed Return Appointment in 2 weeks. Nurse Visit as needed Home Health: [place]: - [place] for wound care. May utilize formulary equivalent dressing for wound treatment orders unless otherwise specified. Home Health Nurse may visit PRN to address patient s wound care needs. Scheduled days for dressing changes to be completed; exception, patient has scheduled wound care visit that day. **Please direct any NON-WOUND related issues/requests for orders to patient's Primary Care Physician. **If current dressing causes regression in wound condition, may D/C ordered dressing product/s and apply Normal Saline Moist Dressing daily until next [place] or Other MD appointment. **Notify [facility] of regression in wound condition at [phone]. Bathing/ Shower/ Hygiene: May shower with wound dressing protected with water repellent cover or cast protector. No tub bath. Edema Control - Lymphedema / Segmental Compressive Device / Other: Optional: One layer of unna paste to top of compression wrap (to act as an anchor). - PLEASE USE THIS TO ANCHOR WRAPS AS THEY KEEP FALLING Elevate leg(s) parallel to the floor when sitting. DO YOUR BEST to sleep in the bed at night. DO NOT sleep in your recliner. Long hours of sitting in a recliner leads to swelling of the legs and/or potential wounds on your backside. Additional Orders / Instructions: Follow Nutritious Diet and Increase Protein Intake Medications-Please add to medication list.: Other: - Increase your Lasix (fluid pill) to 60mg  every other day and 40mg  every other day (rotate back and forth) and when you take extra Lasix take 2 of your Potassium pills instead of 1. Follow up with Primary Care doctor about your meds or Cardiology (Lasix and Potassium prescriber) Consults ordered  were: Vascular - [facility] will call you for Reflux Study General Notes: Please make sure extra absorbent dressings are used and not ABD pads which are not absorbent enough. Please anchor the wraps at the top with unna paste to hold them up WOUND #2: - Lower Leg Wound Laterality: Right Cleanser: Wound Cleanser 3 x Per Week/30 Days Discharge Instructions: Wash your hands with soap and water. Remove old dressing, discard into plastic bag and place into trash. Cleanse the wound with Wound Cleanser prior to applying a clean dressing using gauze sponges, not tissues or cotton balls. Do not scrub or use excessive force. Pat dry  using gauze sponges, not tissue or cotton balls. Peri-Wound Care: Desitin Maximum Strength Ointment 4 (oz) 3 x Per Week/30 Days Discharge Instructions: Apply generously to peri wound for protection from drainage Primary Dressing: Silvercel 4 1/4x 4 1/4 (in/in) 3 x Per Week/30 Days Discharge Instructions: Apply Silvercel 4 1/4x 4 1/4 (in/in) as instructed Secondary Dressing: Xtrasorb Large 6x9 (in/in) (Generic) 3 x Per Week/30 Days Discharge Instructions: Apply to wound as directed. Do not cut. Compression Wrap: Profore Lite LF 3 Multilayer Compression Bandaging System (Generic) 3 x Per Week/30 Days Discharge Instructions: Apply 3 multi-layer wrap as prescribed. WOUND #3: - Toe Great Wound Laterality: Left Cleanser: Wound Cleanser 3 x Per Week/30 Days Discharge Instructions: Wash your hands with soap and water. Remove old dressing, discard into plastic bag and place into trash. Cleanse the wound with Wound Cleanser prior to applying a clean dressing using gauze sponges, not tissues or cotton balls. Do not scrub or use excessive force. Pat dry using gauze sponges, not tissue or cotton balls. Peri-Wound Care: Desitin Maximum Strength Ointment 4 (oz) 3 x Per Week/30 Days Discharge Instructions: Apply to peri wound for protection from drainage Primary Dressing:  Silvercel 4 1/4x 4 1/4 (in/in) 3 x Per Week/30 Days Discharge Instructions: Apply Silvercel 4 1/4x 4 1/4 (in/in) as instructed Secondary Dressing: Xtrasorb Large 6x9 (in/in) (Generic) 3 x Per Week/30 Days Discharge Instructions: Apply to wound as directed. Do not cut. Compression Wrap: Profore Lite LF 3 Multilayer Compression Bandaging System (Generic) 3 x Per Week/30 Days Discharge Instructions: Apply 3 multi-layer wrap as prescribed. WOUND #4: - Lower Leg Wound Laterality: Left Cleanser: Wound Cleanser 3 x Per Week/30 Days Discharge Instructions: Wash your hands with soap and water. Remove old dressing, discard into plastic bag and place into trash. Cleanse the wound with Wound Cleanser prior to applying a clean dressing using gauze sponges, not tissues or cotton balls. Do not scrub or use excessive force. Pat dry using gauze sponges, not tissue or cotton balls. Peri-Wound Care: Desitin Maximum Strength Ointment 4 (oz) 3 x Per Week/30 Days Discharge Instructions: Apply to peri wound for protection from drainage Primary Dressing: Silvercel 4 1/4x 4 1/4 (in/in) 3 x Per Week/30 Days Discharge Instructions: Apply Silvercel 4 1/4x 4 1/4 (in/in) as instructed Secondary Dressing: Xtrasorb Large 6x9 (in/in) (Generic) 3 x Per Week/30 Days Discharge Instructions: Apply to wound as directed. Do not cut. Compression Wrap: Profore Lite LF 3 Multilayer Compression Bandaging System (Generic) 3 x Per Week/30 Days Discharge Instructions: Apply 3 multi-layer wrap as prescribed. WOUND #5: - Lower Leg Wound Laterality: Right, Medial Cleanser: Wound Cleanser 3 x Per Week/30 Days Discharge Instructions: Wash your hands with soap and water. Remove old dressing, discard into plastic bag and place into trash. Cleanse the wound with Wound Cleanser prior to applying a clean dressing using gauze sponges, not tissues or cotton balls. Do not scrub or use excessive force. Pat dry using gauze sponges, not tissue or  cotton balls. Peri-Wound Care: Desitin Maximum Strength Ointment 4 (oz) 3 x Per Week/30 Days Discharge Instructions: Apply to peri wound for protection from drainage Primary Dressing: Silvercel 4 1/4x 4 1/4 (in/in) 3 x Per Week/30 Days Discharge Instructions: Apply Silvercel 4 1/4x 4 1/4 (in/in) as instructed [name], [name] ([id number]) Secondary Dressing: Theressa Stamps 6x9 (in/in) (Generic) 3 x Per Week/30 Days Discharge Instructions: Apply to wound as directed. Do not cut. Compression Wrap: Profore Lite LF 3 Multilayer Compression Bandaging System (Generic) 3 x Per Week/30 Days Discharge  Instructions: Apply 3 multi-layer wrap as prescribed. WOUND #7: - Foot Wound Laterality: Dorsal, Left Cleanser: Wound Cleanser 3 x Per Week/30 Days Discharge Instructions: Wash your hands with soap and water. Remove old dressing, discard into plastic bag and place into trash. Cleanse the wound with Wound Cleanser prior to applying a clean dressing using gauze sponges, not tissues or cotton balls. Do not scrub or use excessive force. Pat dry using gauze sponges, not tissue or cotton balls. Peri-Wound Care: Desitin Maximum Strength Ointment 4 (oz) 3 x Per Week/30 Days Discharge Instructions: Apply to peri wound for protection from drainage Primary Dressing: Silvercel 4 1/4x 4 1/4 (in/in) 3 x Per Week/30 Days Discharge Instructions: Apply Silvercel 4 1/4x 4 1/4 (in/in) as instructed Secondary Dressing: Xtrasorb Large 6x9 (in/in) (Generic) 3 x Per Week/30 Days Discharge Instructions: Apply to wound as directed. Do not cut. Compression Wrap: Profore Lite LF 3 Multilayer Compression Bandaging System (Generic) 3 x Per Week/30 Days Discharge Instructions: Apply 3 multi-layer wrap as prescribed. WOUND #8: - Lower Leg Wound Laterality: Right, Distal Cleanser: Wound Cleanser 3 x Per Week/30 Days Discharge Instructions: Wash your hands with soap and water. Remove old dressing, discard into plastic bag and place  into trash. Cleanse the wound with Wound Cleanser prior to applying a clean dressing using gauze sponges, not tissues or cotton balls. Do not scrub or use excessive force. Pat dry using gauze sponges, not tissue or cotton balls. Peri-Wound Care: Desitin Maximum Strength Ointment 4 (oz) 3 x Per Week/30 Days Discharge Instructions: Apply to peri wound for protection from drainage Primary Dressing: Silvercel 4 1/4x 4 1/4 (in/in) 3 x Per Week/30 Days Discharge Instructions: Apply Silvercel 4 1/4x 4 1/4 (in/in) as instructed Secondary Dressing: Xtrasorb Large 6x9 (in/in) (Generic) 3 x Per Week/30 Days Discharge Instructions: Apply to wound as directed. Do not cut. Compression Wrap: Profore Lite LF 3 Multilayer Compression Bandaging System (Generic) 3 x Per Week/30 Days Discharge Instructions: Apply 3 multi-layer wrap as prescribed. 1. Silver alginate under 3 layer compression 2. Increase Lasix 3. Follow-up in 1 week 4. In office sharp debridement Electronic Signature(s) Signed: 04/27/2021 5:39:25 PM By: Miranda Shan DO Entered By: [name] on 04/27/2021 17:27:43 [name] ([id number]) -------------------------------------------------------------------------------- ROS/PFSH Details Patient Name: [name]. Date of Service: 04/27/2021 3:15 PM Medical Record Number: [id number] Patient Account Number: [id number] Date of Birth/Sex: [birth year] (70 y.o. F) Treating RN: [name] Primary Care Provider: Rutherford [name] Other Clinician: Referring Provider: Rutherford [name] Treating Provider/Extender: [name] in Treatment: 4 Information Obtained From Patient Eyes Medical History: Positive for: Cataracts Negative for: Glaucoma; Optic Neuritis Ear/Nose/Mouth/Throat Medical History: Negative for: Chronic sinus problems/congestion; Middle ear problems Hematologic/Lymphatic Medical History: Negative for: Anemia; Hemophilia; Human Immunodeficiency Virus; Lymphedema;  Sickle Cell Disease Respiratory Medical History: Negative for: Aspiration; Asthma; Chronic Obstructive Pulmonary Disease (COPD); Pneumothorax; Sleep Apnea; Tuberculosis Cardiovascular Medical History: Positive for: Arrhythmia - Afib; Hypertension Negative for: Angina; Congestive Heart Failure; Coronary Artery Disease; Deep Vein Thrombosis; Hypotension; Myocardial Infarction; Peripheral Arterial Disease; Peripheral Venous Disease; Phlebitis; Vasculitis Past Medical History Notes: "heart valve problem" Gastrointestinal Medical History: Negative for: Cirrhosis ; Colitis; Crohnos; Hepatitis A; Hepatitis B; Hepatitis C Endocrine Medical History: Positive for: Type II Diabetes Negative for: Type I Diabetes Time with diabetes: since 2007 Treated with: Insulin, Oral agents Blood sugar tested every day: Yes Tested : daily Genitourinary Medical History: Negative for: End Stage Renal Disease Immunological Medical History: Negative for: Lupus Erythematosus; Raynaudos; Scleroderma Integumentary (Skin) [name], Miranda F. (  [id number]) Medical History: Negative for: History of Burn; History of pressure wounds Musculoskeletal Medical History: Positive for: Osteoarthritis Negative for: Gout; Rheumatoid Arthritis; Osteomyelitis Neurologic Medical History: Positive for: Neuropathy - fingers and feet Negative for: Dementia; Quadriplegia; Paraplegia; Seizure Disorder Oncologic Medical History: Negative for: Received Chemotherapy; Received Radiation Psychiatric Medical History: Negative for: Anorexia/bulimia; Confinement Anxiety HBO Extended History Items Eyes: Cataracts Immunizations Pneumococcal Vaccine: Received Pneumococcal Vaccination: Yes Received Pneumococcal Vaccination On or After 60th Birthday: No Implantable Devices Yes Family and Social History Cancer: Yes - Maternal Grandparents,Paternal Grandparents,Mother,Father; Diabetes: Yes - Maternal  Grandparents,Paternal Grandparents,Mother,Siblings; Heart Disease: Yes - Father,Siblings; Hereditary Spherocytosis: No; Hypertension: Yes - Siblings,Father,Mother; Kidney Disease: No; Lung Disease: No; Seizures: No; Stroke: No; Thyroid Problems: No; Tuberculosis: No; Never smoker; Marital Status - Single; Alcohol Use: Never; Drug Use: No History; Caffeine Use: Never; Financial Concerns: Yes; Food, Clothing or Shelter Needs: No; Support System Lacking: No; Transportation Concerns: No Electronic Signature(s) Signed: 04/27/2021 5:39:25 PM By: Miranda Shan DO Signed: 04/28/2021 4:28:15 PM By: [name] Entered By: [name] on 04/27/2021 17:21:23 [name] ([id number]) -------------------------------------------------------------------------------- [place] Details Patient Name: [name]. Date of Service: 04/27/2021 Medical Record Number: [id number] Patient Account Number: [id number] Date of Birth/Sex: May 22, 1951 (71 y.o. F) Treating RN: [name] Primary Care Provider: Rutherford [name] Other Clinician: Referring Provider: Rutherford [name] Treating Provider/Extender: [name] in Treatment: 4 Diagnosis Coding ICD-10 Codes Code Description [phone] Chronic venous hypertension (idiopathic) with ulcer and inflammation of right lower extremity I87.332 Chronic venous hypertension (idiopathic) with ulcer and inflammation of left lower extremity S91.302D Unspecified open wound, left foot, subsequent encounter Facility Procedures CPT4: Description Modifier Quantity Code [id number] 11042 - DEB SUBQ TISSUE 20 SQ CM/< 1 CPT4: ICD-10 Diagnosis Description I87.332 Chronic venous hypertension (idiopathic) with ulcer and inflammation of left lower extremity CPT4: [id number] BILATERAL: Application of multi-layer venous compression system; leg (below knee), including 1 ankle and foot. Physician Procedures CPT4 Code Description: [id number] - WC PHYS LEVEL 3 - EST  PT Modifier: Quantity: 1 CPT4 Code Description: ICD-10 Diagnosis Description I87.331 Chronic venous hypertension (idiopathic) with ulcer and inflammation of r I87.332 Chronic venous hypertension (idiopathic) with ulcer and inflammation of l S91.302D Unspecified open wound, left  foot, subsequent encounter Modifier: ight lower extremity eft lower extremity Quantity: CPT4 Code Description: [id number] 11042 - WC PHYS SUBQ TISS 20 SQ CM Modifier: Quantity: 1 CPT4 Code Description: ICD-10 Diagnosis Description I87.332 Chronic venous hypertension (idiopathic) with ulcer and inflammation of l Modifier: eft lower extremity Quantity: Electronic Signature(s) Signed: 04/27/2021 5:39:25 PM By: Miranda Shan DO Previous Signature: 04/27/2021 5:10:13 PM Version By: [name] Entered By: [name] on 04/27/2021 17:28:00

## 2021-05-04 ENCOUNTER — Encounter (HOSPITAL_BASED_OUTPATIENT_CLINIC_OR_DEPARTMENT_OTHER): Payer: Medicare Other | Admitting: Internal Medicine

## 2021-05-04 ENCOUNTER — Other Ambulatory Visit: Payer: Self-pay

## 2021-05-04 DIAGNOSIS — S91302D Unspecified open wound, left foot, subsequent encounter: Secondary | ICD-10-CM | POA: Diagnosis not present

## 2021-05-04 DIAGNOSIS — I87332 Chronic venous hypertension (idiopathic) with ulcer and inflammation of left lower extremity: Secondary | ICD-10-CM

## 2021-05-04 DIAGNOSIS — I87333 Chronic venous hypertension (idiopathic) with ulcer and inflammation of bilateral lower extremity: Secondary | ICD-10-CM | POA: Diagnosis not present

## 2021-05-04 DIAGNOSIS — I87331 Chronic venous hypertension (idiopathic) with ulcer and inflammation of right lower extremity: Secondary | ICD-10-CM

## 2021-05-05 NOTE — Progress Notes (Signed)
LATRESHA, YAHR ([id number]) Visit Report for 05/04/2021 Arrival Information Details Patient Name: [name], [name]. Date of Service: 05/04/2021 2:45 PM Medical Record Number: [id number] Patient Account Number: [id number] Date of Birth/Sex: 1951/02/08 (70 y.o. F) Treating RN: Donnamarie Poag Primary Care Minyon Billiter: Rutherford Guys Other Clinician: Referring Takelia Urieta: Rutherford Guys Treating Ariauna Farabee/Extender: Yaakov Guthrie in Treatment: 5 Visit Information History Since Last Visit Added or deleted any medications: No Patient Arrived: [name] Had a fall or experienced change in No Arrival Time: 15:04 activities of daily living that may affect Accompanied By: self risk of falls: Transfer Assistance: Manual Hospitalized since last visit: No Patient Has Alerts: Yes Has Dressing in Place as Prescribed: Yes Patient Alerts: Patient on Blood Thinner Has Compression in Place as Prescribed: Yes DIABETIC Pain Present Now: Yes ELIQUIS Electronic Signature(s) Signed: 05/04/2021 4:51:20 PM By: Donnamarie Poag Entered By: Donnamarie Poag on 05/04/2021 15:06:35 [name] ([id number]) -------------------------------------------------------------------------------- Clinic Level of Care Assessment Details Patient Name: [name]. Date of Service: 05/04/2021 2:45 PM Medical Record Number: [id number] Patient Account Number: [id number] Date of Birth/Sex: April 15, 1951 (70 y.o. F) Treating RN: Donnamarie Poag Primary Care Kapono Luhn: Rutherford Guys Other Clinician: Referring Shamiyah Ngu: Rutherford Guys Treating Elvira Langston/Extender: Yaakov Guthrie in Treatment: 5 Clinic Level of Care Assessment Items TOOL 1 Quantity Score _0  - Use when EandM and Procedure is performed on INITIAL visit 0 ASSESSMENTS - Nursing Assessment / Reassessment _1  - General Physical Exam (combine w/ comprehensive assessment (listed just below) when performed on new 0 pt. evals) _2  - 0 Comprehensive Assessment (HX, ROS, Risk  Assessments, Wounds Hx, etc.) ASSESSMENTS - Wound and Skin Assessment / Reassessment _3  - Dermatologic / Skin Assessment (not related to wound area) 0 ASSESSMENTS - Ostomy and/or Continence Assessment and Care _4  - Incontinence Assessment and Management 0 _5  - 0 Ostomy Care Assessment and Management (repouching, etc.) PROCESS - Coordination of Care _6  - Simple Patient / Family Education for ongoing care 0 _7  - 0 Complex (extensive) Patient / Family Education for ongoing care _8  - 0 Staff obtains Programmer, systems, Records, Test Results / Process Orders _9  - 0 Staff telephones HHA, Nursing Homes / Clarify orders / etc _10  - 0 Routine Transfer to another Facility (non-emergent condition) _11  - 0 Routine Hospital Admission (non-emergent condition) _12  - 0 New Admissions / Biomedical engineer / Ordering NPWT, Apligraf, etc. _13  - 0 Emergency Hospital Admission (emergent condition) PROCESS - Special Needs _14  - Pediatric / Minor Patient Management 0 _15  - 0 Isolation Patient Management _16  - 0 Hearing / Language / Visual special needs _17  - 0 Assessment of Community assistance (transportation, D/C planning, etc.) _18  - 0 Additional assistance / Altered mentation _19  - 0 Support Surface(s) Assessment (bed, cushion, seat, etc.) INTERVENTIONS - Miscellaneous _20  - External ear exam 0 _21  - 0 Patient Transfer (multiple staff / Civil Service fast streamer / Similar devices) _22  - 0 Simple Staple / Suture removal (25 or less) _23  - 0 Complex Staple / Suture removal (26 or more) _24  - 0 Hypo/Hyperglycemic Management (do not check if billed separately) _25  - 0 Ankle / Brachial Index (ABI) - do not check if billed separately Has the patient been seen at the hospital within the last three years: Yes Total Score: 0 Level Of Care: ____ [name] ([id number]) Electronic Signature(s) Signed: 05/04/2021 4:51:20 PM By: Donnamarie Poag Entered By: Donnamarie Poag on 05/04/2021 16:41:33 [name]  ([id number]) -------------------------------------------------------------------------------- Compression Therapy Details Patient Name: [name]. Date of Service: 05/04/2021 2:45 PM Medical  Record Number: [id number] Patient Account Number: [id number] Date of Birth/Sex: 03/29/1951 (70 y.o. F) Treating RN: Donnamarie Poag Primary Care Righteous Claiborne: Rutherford Guys Other Clinician: Referring Briahnna Harries: Rutherford Guys Treating Dalasia Predmore/Extender: Yaakov Guthrie in Treatment: 5 Compression Therapy Performed for Wound Assessment: Wound #4 Left Lower Leg Performed By: Clinician Donnamarie Poag, RN Compression Type: Three Layer Post Procedure Diagnosis Same as Pre-procedure Electronic Signature(s) Signed: 05/04/2021 4:51:20 PM By: Donnamarie Poag Entered By: Donnamarie Poag on 05/04/2021 15:51:03 [name] ([id number]) -------------------------------------------------------------------------------- Compression Therapy Details Patient Name: [name]. Date of Service: 05/04/2021 2:45 PM Medical Record Number: [id number] Patient Account Number: [id number] Date of Birth/Sex: [birth year] (70 y.o. F) Treating RN: Donnamarie Poag Primary Care Ayad Nieman: Rutherford Guys Other Clinician: Referring Kori Goins: Rutherford Guys Treating Laureen Frederic/Extender: Yaakov Guthrie in Treatment: 5 Compression Therapy Performed for Wound Assessment: Wound #5 Right,Medial Lower Leg Performed By: Clinician Donnamarie Poag, RN Compression Type: Three Layer Post Procedure Diagnosis Same as Pre-procedure Electronic Signature(s) Signed: 05/04/2021 4:51:20 PM By: Donnamarie Poag Entered By: Donnamarie Poag on 05/04/2021 15:51:03 [name] ([id number]) -------------------------------------------------------------------------------- Encounter Discharge Information Details Patient Name: [name]. Date of Service: 05/04/2021 2:45 PM Medical Record Number: [id number] Patient Account Number: [id number] Date of Birth/Sex:  September 29, 1950 (69 y.o. F) Treating RN: Donnamarie Poag Primary Care Avelynn Sellin: Rutherford Guys Other Clinician: Referring Ailish Prospero: Rutherford Guys Treating Pierre Cumpton/Extender: Yaakov Guthrie in Treatment: 5 Encounter Discharge Information Items Discharge Condition: Stable Ambulatory Status: Walker Discharge Destination: Home Transportation: Private Auto Accompanied By: self Schedule Follow-up Appointment: Yes Clinical Summary of Care: Electronic Signature(s) Signed: 05/04/2021 4:51:20 PM By: Donnamarie Poag Entered By: Donnamarie Poag on 05/04/2021 16:40:47 [name] ([id number]) -------------------------------------------------------------------------------- Lower Extremity Assessment Details Patient Name: [name]. Date of Service: 05/04/2021 2:45 PM Medical Record Number: [id number] Patient Account Number: [id number] Date of Birth/Sex: 1951/05/08 (70 y.o. F) Treating RN: Donnamarie Poag Primary Care Ajani Schnieders: Rutherford Guys Other Clinician: Referring Legna Mausolf: Rutherford Guys Treating Lexy Meininger/Extender: Yaakov Guthrie in Treatment: 5 Edema Assessment Assessed: [Left: Yes] [Right: Yes] [Left: Edema] [Right: :] Calf Left: Right: Point of Measurement: 29 cm From Medial Instep 43 cm 47 cm Ankle Left: Right: Point of Measurement: 10 cm From Medial Instep 22.5 cm 22.5 cm Vascular Assessment Pulses: Dorsalis Pedis Palpable: [Left:Yes] [Right:Yes] Electronic Signature(s) Signed: 05/04/2021 4:51:20 PM By: Donnamarie Poag Entered By: Donnamarie Poag on 05/04/2021 15:42:00 [name] ([id number]) -------------------------------------------------------------------------------- Multi Wound Chart Details Patient Name: [name]. Date of Service: 05/04/2021 2:45 PM Medical Record Number: [id number] Patient Account Number: [id number] Date of Birth/Sex: Nov 02, 1950 (70 y.o. 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 Comorbid History: Cataracts, Arrhythmia, Cataracts, Arrhythmia, Cataracts, Arrhythmia, Hypertension, Type II Diabetes, Hypertension, Type II Diabetes, Hypertension, Type II Diabetes, Osteoarthritis, Neuropathy Osteoarthritis, Neuropathy Osteoarthritis, Neuropathy Date Acquired: 03/23/2021 02/09/2021 03/30/2021 Weeks of Treatment: _0 Wound Status: Open Open Open Clustered Wound: Yes No Yes Measurements L x W x D (cm) 0.3x0.3x0.1 0.3x0.2x0.1 1x0.3x0.1 Area (cm) : 0.071 0.047 0.236 Volume (cm) : 0.007 0.005 0.024 % Reduction in Area: 99.70% 50.00% 90.00% % Reduction in Volume: 99.90% 44.40% 94.90% Classification:  Full Thickness Without Exposed Grade 1 Full Thickness Without Exposed Support Structures Support Structures Exudate Amount: Large None Present Medium Exudate Type: Serous N/A Serous Exudate Color: amber N/A amber Wound Margin: N/A N/A N/A Granulation Amount: Medium (34-66%) Medium (34-66%) Medium (34-66%) Granulation Quality: Red, Pink Red, Pink Red, Pink Necrotic Amount: Medium (34-66%) Medium (34-66%) Medium (34-66%) Necrotic Tissue: Eschar, Adherent [place] Exposed Structures: Fat Layer (Subcutaneous Tissue): Fascia: No N/A Yes Fat Layer (Subcutaneous Tissue): Fascia: No No Tendon: No Tendon: No Muscle: No Muscle: No Joint: No Joint: No Bone: No Bone: No Epithelialization: None None N/A Procedures Performed: N/A N/A Compression Therapy Wound Number: _0 Photos: SITLALY, GUDIEL ([id number]) Wound Location: Right, Medial Lower Leg Left, Dorsal Foot Right, Distal Lower Leg Wounding Event: Gradually Appeared Gradually Appeared Gradually Appeared 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 Comorbid History: Cataracts, Arrhythmia, Cataracts, Arrhythmia, Cataracts, Arrhythmia, Hypertension, Type II Diabetes, Hypertension, Type II Diabetes, Hypertension, Type II Diabetes, Osteoarthritis, Neuropathy Osteoarthritis, Neuropathy Osteoarthritis, Neuropathy Date Acquired: 04/18/2021 04/18/2021 04/27/2021 Weeks of Treatment: _1 Wound Status: Open Healed - Epithelialized Open Clustered Wound: Yes No No Measurements L x W x D (cm) 1.2x2x0.1 0x0x0 0.3x0.2x0.1 Area (cm) : 1.885 0 0.047 Volume (cm) : 0.188 0 0.005 % Reduction in Area: -166.60% 100.00% 91.70% % Reduction in Volume: -33.30% 100.00% 91.20% Classification: Full Thickness Without Exposed Grade 1 Full Thickness Without Exposed Support Structures Support Structures Exudate Amount: Large None Present Medium Exudate Type: Serous N/A Serous Exudate Color: amber N/A amber Wound Margin: N/A Flat and Intact N/A Granulation Amount: Large (67-100%) None Present (0%) Large (67-100%) Granulation Quality: Red, Pink N/A Red Necrotic Amount: Small (1-33%) None Present (0%) Small (1-33%) Necrotic Tissue: [place] Exposed Structures: N/A Fascia: No N/A Fat Layer (Subcutaneous Tissue): No Tendon: No Muscle: No Joint: No Bone: No Epithelialization: N/A None N/A Procedures Performed: Compression Therapy N/A N/A Treatment Notes Electronic Signature(s) Signed: 05/04/2021 4:08:09 PM By: Kalman Shan DO Entered By: Kalman Shan on 05/04/2021 16:03:36 [name] ([id number]) -------------------------------------------------------------------------------- [place] Details Patient Name: [name]. Date  of Service: 05/04/2021 2:45 PM Medical Record Number: [id number] Patient Account Number: [id number] Date of Birth/Sex: 08-23-1950 (70 y.o. F) Treating RN: Donnamarie Poag Primary Care Deniz Eskridge: Rutherford Guys Other Clinician: Referring Shanon Becvar: Rutherford Guys Treating Anh Bigos/Extender: Yaakov Guthrie in Treatment: 5 Active Inactive Wound/Skin Impairment Nursing Diagnoses: Impaired tissue integrity Goals: Patient/caregiver will verbalize understanding of skin care regimen Date Initiated: 03/30/2021 Date Inactivated: 04/27/2021 Target Resolution Date: 03/30/2021 Goal Status: Met Ulcer/skin breakdown will have a volume reduction of 30% by week 4 Date Initiated: 03/30/2021 Target Resolution Date: 04/30/2021 Goal Status: Active Ulcer/skin breakdown will have a volume reduction of 50% by week 8 Date Initiated: 03/30/2021 Target Resolution Date: 05/30/2021 Goal Status: Active Ulcer/skin breakdown will have a volume reduction of 80% by week 12 Date Initiated: 03/30/2021 Target Resolution Date: 06/30/2021 Goal Status: Active Ulcer/skin breakdown will heal within 14 weeks Date Initiated: 03/30/2021 Target Resolution Date: 07/31/2021 Goal Status: Active Interventions: Assess patient/caregiver ability to obtain necessary supplies Assess patient/caregiver ability to perform ulcer/skin care regimen upon admission and as needed Assess ulceration(s) every visit Treatment Activities: Patient referred to home care : 03/30/2021 Referred to DME [facility] for dressing supplies : 03/30/2021 Skin care regimen initiated : 03/30/2021 Notes: Electronic Signature(s) Signed: 05/04/2021 4:51:20 PM By: Donnamarie Poag Entered ByDonnamarie Poag on 05/04/2021 15:49:01 Bernita Raisin  F. ([id number]) -------------------------------------------------------------------------------- Pain Assessment Details Patient Name: YANINA, KNUPP. Date of Service: 05/04/2021 2:45 PM Medical Record Number: [id number] Patient Account Number:  [id number] Date of Birth/Sex: 1950/07/27 (70 y.o. F) Treating RN: Donnamarie Poag Primary Care Rumaysa Sabatino: Rutherford Guys Other Clinician: Referring Tilla Wilborn: Rutherford Guys Treating Gideon Burstein/Extender: Yaakov Guthrie in Treatment: 5 Active Problems Location of Pain Severity and Description of Pain Patient Has Paino No Site Locations Rate the pain. Current Pain Level: 0 Pain Management and Medication Current Pain Management: Electronic Signature(s) Signed: 05/04/2021 4:51:20 PM By: Donnamarie Poag Entered By: Donnamarie Poag on 05/04/2021 15:12:17 [name] ([id number]) -------------------------------------------------------------------------------- Patient/Caregiver Education Details Patient Name: [name]. Date of Service: 05/04/2021 2:45 PM Medical Record Number: [id number] Patient Account Number: [id number] Date of Birth/Gender: [birth year] (70 y.o. F) Treating RN: Donnamarie Poag Primary Care Physician: Rutherford Guys Other Clinician: Referring Physician: Rutherford Guys Treating Physician/Extender: Yaakov Guthrie in Treatment: 5 Education Assessment Education Provided To: Patient Education Topics Provided Wound/Skin Impairment: Electronic Signature(s) Signed: 05/04/2021 4:51:20 PM By: Donnamarie Poag Entered By: Donnamarie Poag on 05/04/2021 16:41:18 [name] ([id number]) -------------------------------------------------------------------------------- Wound Assessment Details Patient Name: [name]. Date of Service: 05/04/2021 2:45 PM Medical Record Number: [id number] Patient Account Number: [id number] Date of Birth/Sex: 04-Nov-1950 (70 y.o. F) Treating RN: Donnamarie Poag Primary Care Joselito Fieldhouse: Rutherford Guys Other Clinician: Referring Courney Garrod: Rutherford Guys Treating Trust Leh/Extender: Yaakov Guthrie in Treatment: 5 Wound Status Wound Number: 2 Primary Venous Leg Ulcer Etiology: Wound Location: Right Lower Leg Wound Status: Open Wounding Event:  Blister Comorbid Cataracts, Arrhythmia, Hypertension, Type II Diabetes, Date Acquired: 03/23/2021 History: Osteoarthritis, Neuropathy Weeks Of Treatment: 5 Clustered Wound: Yes Photos Wound Measurements Length: (cm) 0.3 Width: (cm) 0.3 Depth: (cm) 0.1 Area: (cm) 0.071 Volume: (cm) 0.007 % Reduction in Area: 99.7% % Reduction in Volume: 99.9% Epithelialization: None Tunneling: No Undermining: No Wound Description Classification: Full Thickness Without Exposed Support Structures Exudate Amount: Large Exudate Type: Serous Exudate Color: amber Foul Odor After Cleansing: No Slough/Fibrino Yes Wound Bed Granulation Amount: Medium (34-66%) Exposed Structure Granulation Quality: Red, Pink Fascia Exposed: No Necrotic Amount: Medium (34-66%) Fat Layer (Subcutaneous Tissue) Exposed: Yes Necrotic Quality: Eschar, Adherent Slough Tendon Exposed: No Muscle Exposed: No Joint Exposed: No Bone Exposed: No Treatment Notes Wound #2 (Lower Leg) Wound Laterality: Right Cleanser Wound Cleanser Discharge Instruction: Wash your hands with soap and water. Remove old dressing, discard into plastic bag and place into trash. Cleanse the wound with Wound Cleanser prior to applying a clean dressing using gauze sponges, not tissues or cotton balls. Do not scrub or use excessive force. Pat dry using gauze sponges, not tissue or cotton balls. LAKEYTA, VANDENHEUVEL ([id number]) Peri-Wound Care Topical Primary Dressing Silvercel Small 2x2 (in/in) Discharge Instruction: Apply Silvercel Small 2x2 (in/in) as instructed Secondary Dressing ABD Pad 5x9 (in/in) Discharge Instruction: Cover with ABD pad Secured With Compression Wrap Profore Lite LF 3 Multilayer Compression Bandaging System Discharge Instruction: Apply 3 multi-layer wrap as prescribed. Compression Stockings Add-Ons Electronic Signature(s) Signed: 05/04/2021 4:51:20 PM By: Donnamarie Poag Entered By: Donnamarie Poag on 05/04/2021 15:15:59 [name] ([id number]) -------------------------------------------------------------------------------- Wound Assessment Details Patient Name: [name]. Date of Service: 05/04/2021 2:45 PM Medical Record Number: [id number] Patient Account Number: [id number] Date of Birth/Sex: Sep 19, 1950 (70 y.o. F) Treating RN: Donnamarie Poag Primary Care Jhase Creppel: Rutherford Guys Other Clinician: Referring Annalysse Shoemaker: Rutherford Guys Treating Ota Ebersole/Extender: Yaakov Guthrie in Treatment: 5 Wound Status Wound Number: 3 Primary Diabetic Wound/Ulcer of the Lower Extremity Etiology: Wound  Location: Left Toe Great Wound Status: Open Wounding Event: Gradually Appeared Comorbid Cataracts, Arrhythmia, Hypertension, Type II Diabetes, Date Acquired: 02/09/2021 History: Osteoarthritis, Neuropathy Weeks Of Treatment: 5 Clustered Wound: No Photos Wound Measurements Length: (cm) 0.3 Width: (cm) 0.2 Depth: (cm) 0.1 Area: (cm) 0.047 Volume: (cm) 0.005 % Reduction in Area: 50% % Reduction in Volume: 44.4% Epithelialization: None Tunneling: No Undermining: No Wound Description Classification: Grade 1 Exudate Amount: None Present Foul Odor After Cleansing: No Slough/Fibrino Yes Wound Bed Granulation Amount: Medium (34-66%) Exposed Structure Granulation Quality: Red, Pink Fascia Exposed: No Necrotic Amount: Medium (34-66%) Fat Layer (Subcutaneous Tissue) Exposed: No Necrotic Quality: Adherent Slough Tendon Exposed: No Muscle Exposed: No Joint Exposed: No Bone Exposed: No Treatment Notes Wound #3 (Toe Great) Wound Laterality: Left Cleanser Peri-Wound Care Topical Santyl Collagenase Ointment, 30 (gm), tube Discharge Instruction: aT WOUND CENTER apply nickel thick to wound bed only KELIN, NIXON. ([id number]) Primary Dressing Hydrofera Blue Ready Transfer Foam, 2.5x2.5 (in/in) Discharge Instruction: Apply Hydrofera Blue Ready to wound bed as directed Secondary Dressing Gauze Secured  With 73M Medipore H Soft Cloth Surgical Tape, 2x2 (in/yd) Compression Wrap Compression Stockings Add-Ons Electronic Signature(s) Signed: 05/04/2021 4:51:20 PM By: Donnamarie Poag Entered By: Donnamarie Poag on 05/04/2021 15:17:01 [name] ([id number]) -------------------------------------------------------------------------------- Wound Assessment Details Patient Name: [name]. Date of Service: 05/04/2021 2:45 PM Medical Record Number: [id number] Patient Account Number: [id number] Date of Birth/Sex: October 11, 1950 (70 y.o. F) Treating RN: Donnamarie Poag Primary Care Arihana Ambrocio: Rutherford Guys Other Clinician: Referring Karem Farha: Rutherford Guys Treating Masiya Claassen/Extender: Yaakov Guthrie in Treatment: 5 Wound Status Wound Number: 4 Primary Venous Leg Ulcer Etiology: Wound Location: Left Lower Leg Wound Status: Open Wounding Event: Blister Comorbid Cataracts, Arrhythmia, Hypertension, Type II Diabetes, Date Acquired: 03/30/2021 History: Osteoarthritis, Neuropathy Weeks Of Treatment: 5 Clustered Wound: Yes Photos Wound Measurements Length: (cm) 1 Width: (cm) 0.3 Depth: (cm) 0.1 Area: (cm) 0.236 Volume: (cm) 0.024 % Reduction in Area: 90% % Reduction in Volume: 94.9% Tunneling: No Undermining: No Wound Description Classification: Full Thickness Without Exposed Support Structures Exudate Amount: Medium Exudate Type: Serous Exudate Color: amber Foul Odor After Cleansing: No Slough/Fibrino Yes Wound Bed Granulation Amount: Medium (34-66%) Granulation Quality: Red, Pink Necrotic Amount: Medium (34-66%) Necrotic Quality: Adherent Slough Treatment Notes Wound #4 (Lower Leg) Wound Laterality: Left Cleanser Wound Cleanser Discharge Instruction: Wash your hands with soap and water. Remove old dressing, discard into plastic bag and place into trash. Cleanse the wound with Wound Cleanser prior to applying a clean dressing using gauze sponges, not tissues or cotton balls. Do  not scrub or use excessive force. Pat dry using gauze sponges, not tissue or cotton balls. Peri-Wound Care Topical ITZABELLA, SORRELS ([id number]) Primary Dressing Silvercel Small 2x2 (in/in) Discharge Instruction: Apply Silvercel Small 2x2 (in/in) as instructed Secondary Dressing ABD Pad 5x9 (in/in) Discharge Instruction: Cover with ABD pad Secured With Compression Wrap Profore Lite LF 3 Multilayer Compression Bandaging System Discharge Instruction: Apply 3 multi-layer wrap as prescribed. Compression Stockings Add-Ons Electronic Signature(s) Signed: 05/04/2021 4:51:20 PM By: Donnamarie Poag Entered By: Donnamarie Poag on 05/04/2021 15:38:56 [name] ([id number]) -------------------------------------------------------------------------------- Wound Assessment Details Patient Name: [name]. Date of Service: 05/04/2021 2:45 PM Medical Record Number: [id number] Patient Account Number: [id number] Date of Birth/Sex: 07-14-1950 (70 y.o. F) Treating RN: Donnamarie Poag Primary Care Kahlyn Shippey: Rutherford Guys Other Clinician: Referring Izac Faulkenberry: Rutherford Guys Treating Knight Oelkers/Extender: Yaakov Guthrie in Treatment: 5 Wound Status Wound Number: 5 Primary Venous Leg Ulcer Etiology: Wound Location: Right, Medial  Lower Leg Wound Status: Open Wounding Event: Gradually Appeared Comorbid Cataracts, Arrhythmia, Hypertension, Type II Diabetes, Date Acquired: 04/18/2021 History: Osteoarthritis, Neuropathy Weeks Of Treatment: 2 Clustered Wound: Yes Photos Wound Measurements Length: (cm) 1.2 Width: (cm) 2 Depth: (cm) 0.1 Area: (cm) 1.885 Volume: (cm) 0.188 % Reduction in Area: -166.6% % Reduction in Volume: -33.3% Tunneling: No Undermining: No Wound Description Classification: Full Thickness Without Exposed Support Structures Exudate Amount: Large Exudate Type: Serous Exudate Color: amber Foul Odor After Cleansing: No Slough/Fibrino Yes Wound Bed Granulation Amount: Large  (67-100%) Granulation Quality: Red, Pink Necrotic Amount: Small (1-33%) Necrotic Quality: Adherent Slough Treatment Notes Wound #5 (Lower Leg) Wound Laterality: Right, Medial Cleanser Wound Cleanser Discharge Instruction: Wash your hands with soap and water. Remove old dressing, discard into plastic bag and place into trash. Cleanse the wound with Wound Cleanser prior to applying a clean dressing using gauze sponges, not tissues or cotton balls. Do not scrub or use excessive force. Pat dry using gauze sponges, not tissue or cotton balls. Peri-Wound Care Topical LACE, CHENEVERT ([id number]) Primary Dressing Silvercel Small 2x2 (in/in) Discharge Instruction: Apply Silvercel Small 2x2 (in/in) as instructed Secondary Dressing ABD Pad 5x9 (in/in) Discharge Instruction: Cover with ABD pad Secured With Compression Wrap Profore Lite LF 3 Multilayer Compression Bandaging System Discharge Instruction: Apply 3 multi-layer wrap as prescribed. Compression Stockings Add-Ons Electronic Signature(s) Signed: 05/04/2021 4:51:20 PM By: Donnamarie Poag Entered By: Donnamarie Poag on 05/04/2021 15:37:09 [name] ([id number]) -------------------------------------------------------------------------------- Wound Assessment Details Patient Name: [name]. Date of Service: 05/04/2021 2:45 PM Medical Record Number: [id number] Patient Account Number: [id number] Date of Birth/Sex: April 12, 1951 (70 y.o. F) Treating RN: Donnamarie Poag Primary Care Deron Poole: Rutherford Guys Other Clinician: Referring Nashia Remus: Rutherford Guys Treating Estephanie Hubbs/Extender: Yaakov Guthrie in Treatment: 5 Wound Status Wound Number: 7 Primary Diabetic Wound/Ulcer of the Lower Extremity Etiology: Wound Location: Left, Dorsal Foot Wound Status: Healed - Epithelialized Wounding Event: Gradually Appeared Comorbid Cataracts, Arrhythmia, Hypertension, Type II Diabetes, Date Acquired: 04/18/2021 History: Osteoarthritis,  Neuropathy Weeks Of Treatment: 2 Clustered Wound: No Photos Wound Measurements Length: (cm) 0 Width: (cm) 0 Depth: (cm) 0 Area: (cm) 0 Volume: (cm) 0 % Reduction in Area: 100% % Reduction in Volume: 100% Epithelialization: None Wound Description Classification: Grade 1 Wound Margin: Flat and Intact Exudate Amount: None Present Foul Odor After Cleansing: No Slough/Fibrino No Wound Bed Granulation Amount: None Present (0%) Exposed Structure Necrotic Amount: None Present (0%) Fascia Exposed: No Fat Layer (Subcutaneous Tissue) Exposed: No Tendon Exposed: No Muscle Exposed: No Joint Exposed: No Bone Exposed: No Electronic Signature(s) Signed: 05/04/2021 4:51:20 PM By: Donnamarie Poag Entered By: Donnamarie Poag on 05/04/2021 15:37:51 [name] ([id number]) -------------------------------------------------------------------------------- Wound Assessment Details Patient Name: [name]. Date of Service: 05/04/2021 2:45 PM Medical Record Number: [id number] Patient Account Number: [id number] Date of Birth/Sex: [birth year] (70 y.o. F) Treating RN: Donnamarie Poag Primary Care Amaria Mundorf: Rutherford Guys Other Clinician: Referring Khalis Hittle: Rutherford Guys Treating Akeel Reffner/Extender: Yaakov Guthrie in Treatment: 5 Wound Status Wound Number: 8 Primary Venous Leg Ulcer Etiology: Wound Location: Right, Distal Lower Leg Wound Status: Open Wounding Event: Gradually Appeared Comorbid Cataracts, Arrhythmia, Hypertension, Type II Diabetes, Date Acquired: 04/27/2021 History: Osteoarthritis, Neuropathy Weeks Of Treatment: 1 Clustered Wound: No Photos Wound Measurements Length: (cm) 0.3 Width: (cm) 0.2 Depth: (cm) 0.1 Area: (cm) 0.047 Volume: (cm) 0.005 % Reduction in Area: 91.7% % Reduction in Volume: 91.2% Tunneling: No Undermining: No Wound Description Classification: Full Thickness Without Exposed Support Structures Exudate Amount: Medium Exudate  Type: Serous Exudate  Color: amber Foul Odor After Cleansing: No Slough/Fibrino No Wound Bed Granulation Amount: Large (67-100%) Granulation Quality: Red Necrotic Amount: Small (1-33%) Necrotic Quality: Adherent Slough Treatment Notes Wound #8 (Lower Leg) Wound Laterality: Right, Distal Cleanser Wound Cleanser Discharge Instruction: Wash your hands with soap and water. Remove old dressing, discard into plastic bag and place into trash. Cleanse the wound with Wound Cleanser prior to applying a clean dressing using gauze sponges, not tissues or cotton balls. Do not scrub or use excessive force. Pat dry using gauze sponges, not tissue or cotton balls. Peri-Wound Care Topical PIERRE, CUMPTON ([id number]) Primary Dressing Silvercel Small 2x2 (in/in) Discharge Instruction: Apply Silvercel Small 2x2 (in/in) as instructed Secondary Dressing ABD Pad 5x9 (in/in) Discharge Instruction: Cover with ABD pad Secured With Compression Wrap Profore Lite LF 3 Multilayer Compression Bandaging System Discharge Instruction: Apply 3 multi-layer wrap as prescribed. Compression Stockings Add-Ons Electronic Signature(s) Signed: 05/04/2021 4:51:20 PM By: Donnamarie Poag Entered By: Donnamarie Poag on 05/04/2021 15:40:18 [name] ([id number]) -------------------------------------------------------------------------------- [place] Details Patient Name: [name]. Date of Service: 05/04/2021 2:45 PM Medical Record Number: [id number] Patient Account Number: [id number] Date of Birth/Sex: 1950-09-01 (70 y.o. F) Treating RN: Donnamarie Poag Primary Care Docia Klar: Rutherford Guys Other Clinician: Referring Avanti Jetter: Rutherford Guys Treating Demarie Uhlig/Extender: Yaakov Guthrie in Treatment: 5 Vital Signs Time Taken: 15:10 Temperature (F): 98.1 Height (in): 66 Pulse (bpm): 94 Weight (lbs): 332 Respiratory Rate (breaths/min): 16 Body Mass Index (BMI): 53.6 Blood Pressure (mmHg): 181/86 Reference Range: 80 - 120 mg /  dl Electronic Signature(s) Signed: 05/04/2021 4:51:20 PM By: Donnamarie Poag Entered ByDonnamarie Poag on 05/04/2021 15:12:00

## 2021-05-05 NOTE — Progress Notes (Signed)
BERMA, HARTS ([id number]) Visit Report for 05/04/2021 Chief Complaint Document Details Patient Name: [name], [name]. Date of Service: 05/04/2021 2:45 PM Medical Record Number: [id number] Patient Account Number: [id number] Date of Birth/Sex: 23-Nov-1950 (70 y.o. F) Treating RN: Cornell Barman Primary Care Provider: Rutherford Guys Other Clinician: Referring Provider: Rutherford Guys Treating Provider/Extender: Yaakov Guthrie in Treatment: 5 Information Obtained from: Patient Chief Complaint Bilateral lower extremity wounds Electronic Signature(s) Signed: 05/04/2021 4:08:09 PM By: Kalman Shan DO Entered By: Kalman Shan on 05/04/2021 16:03:47 [name] ([id number]) -------------------------------------------------------------------------------- HPI Details Patient Name: [name]. Date of Service: 05/04/2021 2:45 PM Medical Record Number: [id number] Patient Account Number: [id number] Date of Birth/Sex: 06-12-1951 (70 y.o. F) Treating RN: Cornell Barman Primary Care Provider: Rutherford Guys Other Clinician: Referring Provider: Rutherford Guys Treating Provider/Extender: Yaakov Guthrie in Treatment: 5 History of Present Illness HPI Description: Admission 10/5 Ms. Avannah Decker is a 70 year old female with a past medical history of hypertension, venous insufficiency, chronic diastolic heart failure and insulin-dependent type 2 diabetes that presents To the clinic for bilateral lower extremity wounds. She states that her right lower extremity developed a blister 1 week ago and she has been keeping the area covered with a bandaid. She states that when the Band-Aid came off it tore her skin. She subsequently developed pain, increased warmth and erythema to the right lower extremity. She denies purulent drainage. She has also developed an open wound to the left lower extremity that recently developed. She also has a small open wound to the proximal portion of the left great  toe. This has been present since August and was evaluated by podiatry. It has not healed and she has not followed up with podiatry for this issue. 10/12; patient presents for follow-up. She states she has 2 days left on her antibiotics. She is tolerated this well. She reports improvement to her right lower extremity symptoms. She denies signs of infection. She has home health. 10/26; patient presents for follow-up. She has no issues or complaints today. The compression wraps slid down to her shin. These were placed on Monday by home health. She has a couple of new open wounds due to this. She denies signs of infection. 11/2; patient presents for follow-up. The compression wraps had slid down to her shin again. The wraps taken off today do not appear to be appropriate material for 3 layer compression wrap. She currently denies signs of infection. 11/9; patient presents for follow-up. She was able to increase her diuretic over the past week. Again home health does not have appropriate materials for 3 layer compression. She currently denies signs of infection. Electronic Signature(s) Signed: 05/04/2021 4:08:09 PM By: Kalman Shan DO Entered By: Kalman Shan on 05/04/2021 16:04:30 [name] ([id number]) -------------------------------------------------------------------------------- Physical Exam Details Patient Name: [name]. Date of Service: 05/04/2021 2:45 PM Medical Record Number: [id number] Patient Account Number: [id number] Date of Birth/Sex: 02-Jan-1951 (70 y.o. F) Treating RN: Cornell Barman Primary Care Provider: Rutherford Guys Other Clinician: Referring Provider: Rutherford Guys Treating Provider/Extender: Yaakov Guthrie in Treatment: 5 Constitutional . Cardiovascular . Psychiatric . Notes Scattered open wounds limited to skin breakdown to lower extremities bilaterally. Good edema control. No weeping on exam. To the left great toe there is an open wound.  Granulation tissue present. Electronic Signature(s) Signed: 05/04/2021 4:08:09 PM By: Kalman Shan DO Entered By: Kalman Shan on 05/04/2021 16:05:27 [name] ([id number]) -------------------------------------------------------------------------------- Physician Orders Details Patient Name: [name]. Date of Service: 05/04/2021 2:45  PM Medical Record Number: [id number] Patient Account Number: [id number] Date of Birth/Sex: July 07, 1950 (70 y.o. F) Treating RN: Donnamarie Poag Primary Care Provider: Rutherford Guys Other Clinician: Referring Provider: Rutherford Guys Treating Provider/Extender: Yaakov Guthrie in Treatment: 5 Verbal / Phone Orders: No Diagnosis Coding ICD-10 Coding Code Description I87.331 Chronic venous hypertension (idiopathic) with ulcer and inflammation of right lower extremity I87.332 Chronic venous hypertension (idiopathic) with ulcer and inflammation of left lower extremity S91.302D Unspecified open wound, left foot, subsequent encounter Follow-up Appointments o Return Appointment in 1 week. o Nurse Visit as needed [facility] for wound care. May utilize formulary equivalent dressing for wound treatment orders unless otherwise specified. Home Health Nurse may visit PR[facility] to address patientos wound care needs. o Scheduled days for dressing changes to be completed; exception, patient has scheduled wound care visit that day. o **Please direct any NON-WOUND related issues/requests for orders to patient's Primary Care Physician. **If current dressing causes regression in wound condition, may D/C ordered dressing product/s and apply Normal Saline Moist Dressing daily until next [place] or Other MD appointment. **Notify [facility] of regression in wound condition at [facility] Bathing/ Shower/ Hygiene o May shower with wound dressing  protected with water repellent cover or cast protector. o No tub bath. Wound Treatment Wound #2 - Lower Leg Wound Laterality: Right Cleanser: Wound Cleanser 3 x Per Week/30 Days Discharge Instructions: Wash your hands with soap and water. Remove old dressing, discard into plastic bag and place into trash. Cleanse the wound with Wound Cleanser prior to applying a clean dressing using gauze sponges, not tissues or cotton balls. Do not scrub or use excessive force. Pat dry using gauze sponges, not tissue or cotton balls. Primary Dressing: Silvercel Small 2x2 (in/in) 3 x Per Week/30 Days Discharge Instructions: Apply Silvercel Small 2x2 (in/in) as instructed Secondary Dressing: ABD Pad 5x9 (in/in) 3 x Per Week/30 Days Discharge Instructions: Cover with ABD pad Compression Wrap: Profore Lite LF 3 Multilayer Compression Bandaging System (Generic) 3 x Per Week/30 Days Discharge Instructions: Apply 3 multi-layer wrap as prescribed. Wound #3 - Toe Great Wound Laterality: Left Topical: Santyl Collagenase Ointment, 30 (gm), tube 3 x Per Week/30 Days Discharge Instructions: aT WOUND CENTER apply nickel thick to wound bed only Primary Dressing: Hydrofera Blue Ready Transfer Foam, 2.5x2.5 (in/in) 3 x Per Week/30 Days Discharge Instructions: Apply Hydrofera Blue Ready to wound bed as directed Secondary Dressing: Gauze 3 x Per Week/30 Days Secured With: 73M Medipore H Soft Cloth Surgical Tape, 2x2 (in/yd) 3 x Per Week/30 Days Wound #4 - Lower Leg Wound Laterality: Left Cleanser: Wound Cleanser 3 x Per Week/30 Days JEANET, LUPE ([id number]) Discharge Instructions: Wash your hands with soap and water. Remove old dressing, discard into plastic bag and place into trash. Cleanse the wound with Wound Cleanser prior to applying a clean dressing using gauze sponges, not tissues or cotton balls. Do not scrub or use excessive force. Pat dry using gauze sponges, not tissue or cotton balls. Primary Dressing:  Silvercel Small 2x2 (in/in) 3 x Per Week/30 Days Discharge Instructions: Apply Silvercel Small 2x2 (in/in) as instructed Secondary Dressing: ABD Pad 5x9 (in/in) 3 x Per Week/30 Days Discharge Instructions: Cover with ABD pad Compression Wrap: Profore Lite LF 3 Multilayer Compression Bandaging System (Generic) 3 x Per Week/30 Days Discharge Instructions: Apply 3 multi-layer wrap as prescribed. Wound #5 - Lower Leg Wound Laterality: Right, Medial Cleanser:  Wound Cleanser 3 x Per Week/30 Days Discharge Instructions: Wash your hands with soap and water. Remove old dressing, discard into plastic bag and place into trash. Cleanse the wound with Wound Cleanser prior to applying a clean dressing using gauze sponges, not tissues or cotton balls. Do not scrub or use excessive force. Pat dry using gauze sponges, not tissue or cotton balls. Primary Dressing: Silvercel Small 2x2 (in/in) 3 x Per Week/30 Days Discharge Instructions: Apply Silvercel Small 2x2 (in/in) as instructed Secondary Dressing: ABD Pad 5x9 (in/in) 3 x Per Week/30 Days Discharge Instructions: Cover with ABD pad Compression Wrap: Profore Lite LF 3 Multilayer Compression Bandaging System (Generic) 3 x Per Week/30 Days Discharge Instructions: Apply 3 multi-layer wrap as prescribed. Wound #8 - Lower Leg Wound Laterality: Right, Distal Cleanser: Wound Cleanser 3 x Per Week/30 Days Discharge Instructions: Wash your hands with soap and water. Remove old dressing, discard into plastic bag and place into trash. Cleanse the wound with Wound Cleanser prior to applying a clean dressing using gauze sponges, not tissues or cotton balls. Do not scrub or use excessive force. Pat dry using gauze sponges, not tissue or cotton balls. Primary Dressing: Silvercel Small 2x2 (in/in) 3 x Per Week/30 Days Discharge Instructions: Apply Silvercel Small 2x2 (in/in) as instructed Secondary Dressing: ABD Pad 5x9 (in/in) 3 x Per Week/30 Days Discharge Instructions:  Cover with ABD pad Compression Wrap: Profore Lite LF 3 Multilayer Compression Bandaging System (Generic) 3 x Per Week/30 Days Discharge Instructions: Apply 3 multi-layer wrap as prescribed. Electronic Signature(s) Signed: 05/04/2021 4:12:26 PM By: Donnamarie Poag Signed: 05/04/2021 4:20:47 PM By: Kalman Shan DO Entered By: Donnamarie Poag on 05/04/2021 16:12:25 [name] ([id number]) -------------------------------------------------------------------------------- Problem List Details Patient Name: MADILYNE, TADLOCK. Date of Service: 05/04/2021 2:45 PM Medical Record Number: [id number] Patient Account Number: [id number] Date of Birth/Sex: 1950/10/10 (70 y.o. F) Treating RN: Cornell Barman Primary Care Provider: Rutherford Guys Other Clinician: Referring Provider: Rutherford Guys Treating Provider/Extender: Yaakov Guthrie in Treatment: 5 Active Problems ICD-10 Encounter Code Description Active Date MDM Diagnosis I87.331 Chronic venous hypertension (idiopathic) with ulcer and inflammation of 03/30/2021 No Yes right lower extremity I87.332 Chronic venous hypertension (idiopathic) with ulcer and inflammation of 03/30/2021 No Yes left lower extremity S91.302D Unspecified open wound, left foot, subsequent encounter 03/30/2021 No Yes Inactive Problems Resolved Problems Electronic Signature(s) Signed: 05/04/2021 4:08:09 PM By: Kalman Shan DO Entered By: Kalman Shan on 05/04/2021 16:03:26 [name] ([id number]) -------------------------------------------------------------------------------- Progress Note Details Patient Name: [name]. Date of Service: 05/04/2021 2:45 PM Medical Record Number: [id number] Patient Account Number: [id number] Date of Birth/Sex: [birth year] (70 y.o. F) Treating RN: Cornell Barman Primary Care Provider: Rutherford Guys Other Clinician: Referring Provider: Rutherford Guys Treating Provider/Extender: Yaakov Guthrie in Treatment:  5 Subjective Chief Complaint Information obtained from Patient Bilateral lower extremity wounds History of Present Illness (HPI) Admission 10/5 Ms. Maquita Sandoval is a 70 year old female with a past medical history of hypertension, venous insufficiency, chronic diastolic heart failure and insulin-dependent type 2 diabetes that presents To the clinic for bilateral lower extremity wounds. She states that her right lower extremity developed a blister 1 week ago and she has been keeping the area covered with a bandaid. She states that when the Band-Aid came off it tore her skin. She subsequently developed pain, increased warmth and erythema to the right lower extremity. She denies purulent drainage. She has also developed an open wound to the left lower extremity that recently developed. She also has a small  open wound to the proximal portion of the left great toe. This has been present since August and was evaluated by podiatry. It has not healed and she has not followed up with podiatry for this issue. 10/12; patient presents for follow-up. She states she has 2 days left on her antibiotics. She is tolerated this well. She reports improvement to her right lower extremity symptoms. She denies signs of infection. She has home health. 10/26; patient presents for follow-up. She has no issues or complaints today. The compression wraps slid down to her shin. These were placed on Monday by home health. She has a couple of new open wounds due to this. She denies signs of infection. 11/2; patient presents for follow-up. The compression wraps had slid down to her shin again. The wraps taken off today do not appear to be appropriate material for 3 layer compression wrap. She currently denies signs of infection. 11/9; patient presents for follow-up. She was able to increase her diuretic over the past week. Again home health does not have appropriate materials for 3 layer compression. She currently denies signs  of infection. Patient History Information obtained from Patient. Family History Cancer - Maternal Grandparents,Paternal Grandparents,Mother,Father, Diabetes - Maternal Grandparents,Paternal Grandparents,Mother,Siblings, Heart Disease - Father,Siblings, Hypertension - Siblings,Father,Mother, No family history of Hereditary Spherocytosis, Kidney Disease, Lung Disease, Seizures, Stroke, Thyroid Problems, Tuberculosis. Social History Never smoker, Marital Status - Single, Alcohol Use - Never, Drug Use - No History, Caffeine Use - Never. Medical History Eyes Patient has history of Cataracts Denies history of Glaucoma, Optic Neuritis Ear/Nose/Mouth/Throat Denies history of Chronic sinus problems/congestion, Middle ear problems Hematologic/Lymphatic Denies history of Anemia, Hemophilia, Human Immunodeficiency Virus, Lymphedema, Sickle Cell Disease Respiratory Denies history of Aspiration, Asthma, Chronic Obstructive Pulmonary Disease (COPD), Pneumothorax, Sleep Apnea, Tuberculosis Cardiovascular Patient has history of Arrhythmia - Afib, Hypertension Denies history of Angina, Congestive Heart Failure, Coronary Artery Disease, Deep Vein Thrombosis, Hypotension, Myocardial Infarction, Peripheral Arterial Disease, Peripheral Venous Disease, Phlebitis, Vasculitis Gastrointestinal Denies history of Cirrhosis , Colitis, Crohn s, Hepatitis A, Hepatitis B, Hepatitis C Endocrine Patient has history of Type II Diabetes Denies history of Type I Diabetes Genitourinary Denies history of End Stage Renal Disease Immunological Denies history of Lupus Erythematosus, Raynaud s, Scleroderma Integumentary (Skin) Denies history of History of Burn, History of pressure wounds Musculoskeletal Patient has history of Osteoarthritis MATHILDE, MCWHERTER ([id number]) Denies history of Gout, Rheumatoid Arthritis, Osteomyelitis Neurologic Patient has history of Neuropathy - fingers and feet Denies history of Dementia,  Quadriplegia, Paraplegia, Seizure Disorder Oncologic Denies history of Received Chemotherapy, Received Radiation Psychiatric Denies history of Anorexia/bulimia, Confinement Anxiety Medical And Surgical History Notes Cardiovascular "heart valve problem" Objective Constitutional Vitals Time Taken: 3:10 PM, Height: 66 in, Weight: 332 lbs, BMI: 53.6, Temperature: 98.1 F, Pulse: 94 bpm, Respiratory Rate: 16 breaths/min, Blood Pressure: 181/86 mmHg. General Notes: Scattered open wounds limited to skin breakdown to lower extremities bilaterally. Good edema control. No weeping on exam. To the left great toe there is an open wound. Granulation tissue present. Integumentary (Hair, Skin) Wound #2 status is Open. Original cause of wound was Blister. The date acquired was: 03/23/2021. The wound has been in treatment 5 weeks. The wound is located on the Right Lower Leg. The wound measures 0.3cm length x 0.3cm width x 0.1cm depth; 0.071cm^2 area and 0.007cm^3 volume. There is Fat Layer (Subcutaneous Tissue) exposed. There is no tunneling or undermining noted. There is a large amount of serous drainage noted. There is medium (34-66%) red, pink granulation  within the wound bed. There is a medium (34-66%) amount of necrotic tissue within the wound bed including Eschar and Adherent Slough. Wound #3 status is Open. Original cause of wound was Gradually Appeared. The date acquired was: 02/09/2021. The wound has been in treatment 5 weeks. The wound is located on the Left Toe Great. The wound measures 0.3cm length x 0.2cm width x 0.1cm depth; 0.047cm^2 area and 0.005cm^3 volume. There is no tunneling or undermining noted. There is a none present amount of drainage noted. There is medium (34-66%) red, pink granulation within the wound bed. There is a medium (34-66%) amount of necrotic tissue within the wound bed including Adherent Slough. Wound #4 status is Open. Original cause of wound was Blister. The date  acquired was: 03/30/2021. The wound has been in treatment 5 weeks. The wound is located on the Left Lower Leg. The wound measures 1cm length x 0.3cm width x 0.1cm depth; 0.236cm^2 area and 0.024cm^3 volume. There is no tunneling or undermining noted. There is a medium amount of serous drainage noted. There is medium (34-66%) red, pink granulation within the wound bed. There is a medium (34-66%) amount of necrotic tissue within the wound bed including Adherent Slough. Wound #5 status is Open. Original cause of wound was Gradually Appeared. The date acquired was: 04/18/2021. The wound has been in treatment 2 weeks. The wound is located on the Right,Medial Lower Leg. The wound measures 1.2cm length x 2cm width x 0.1cm depth; 1.885cm^2 area and 0.188cm^3 volume. There is no tunneling or undermining noted. There is a large amount of serous drainage noted. There is large (67-100%) red, pink granulation within the wound bed. There is a small (1-33%) amount of necrotic tissue within the wound bed including Adherent Slough. Wound #7 status is Healed - Epithelialized. Original cause of wound was Gradually Appeared. The date acquired was: 04/18/2021. The wound has been in treatment 2 weeks. The wound is located on the Left,Dorsal Foot. The wound measures 0cm length x 0cm width x 0cm depth; 0cm^2 area and 0cm^3 volume. There is a none present amount of drainage noted. The wound margin is flat and intact. There is no granulation within the wound bed. There is no necrotic tissue within the wound bed. Wound #8 status is Open. Original cause of wound was Gradually Appeared. The date acquired was: 04/27/2021. The wound has been in treatment 1 weeks. The wound is located on the Right,Distal Lower Leg. The wound measures 0.3cm length x 0.2cm width x 0.1cm depth; 0.047cm^2 area and 0.005cm^3 volume. There is no tunneling or undermining noted. There is a medium amount of serous drainage noted. There is large (67- 100%) red  granulation within the wound bed. There is a small (1-33%) amount of necrotic tissue within the wound bed including Adherent Slough. Assessment Active Problems ICD-10 BRODI, NERY ([id number]) Chronic venous hypertension (idiopathic) with ulcer and inflammation of right lower extremity Chronic venous hypertension (idiopathic) with ulcer and inflammation of left lower extremity Unspecified open wound, left foot, subsequent encounter Patient's wounds have shown significant improvement in appearance and size since last clinic visit. She no longer has weeping on exam. I recommended 1 more week of her increased diuretic dose. I recommended continuing silver alginate under compression therapy to her legs bilaterally. For her toe wound I recommended Santyl and Hydrofera Blue. No signs of infection on exam. Follow-up in 1 week. Procedures Wound #4 Pre-procedure diagnosis of Wound #4 is a Venous Leg Ulcer located on the Left Lower Leg .  There was a Three Layer Compression Therapy Procedure by Donnamarie Poag, RN. Post procedure Diagnosis Wound #4: Same as Pre-Procedure Wound #5 Pre-procedure diagnosis of Wound #5 is a Venous Leg Ulcer located on the Right,Medial Lower Leg . There was a Three Layer Compression Therapy Procedure by Donnamarie Poag, RN. Post procedure Diagnosis Wound #5: Same as Pre-Procedure Plan 1. Continue diuretic regimen 2. Silver alginate under 3 layer compression 3. Hydrofera Blue to the toe wound 4. Follow-up in 1 week Electronic Signature(s) Signed: 05/04/2021 4:08:09 PM By: Kalman Shan DO Entered By: Kalman Shan on 05/04/2021 16:07:10 [name] ([id number]) -------------------------------------------------------------------------------- ROS/PFSH Details Patient Name: [name]. Date of Service: 05/04/2021 2:45 PM Medical Record Number: [id number] Patient Account Number: [id number] Date of Birth/Sex: April 05, 1951 (70 y.o. F) Treating RN: Cornell Barman Primary Care Provider: Rutherford Guys Other Clinician: Referring Provider: Rutherford Guys Treating Provider/Extender: Yaakov Guthrie in Treatment: 5 Information Obtained From Patient Eyes Medical History: Positive for: Cataracts Negative for: Glaucoma; Optic Neuritis Ear/Nose/Mouth/Throat Medical History: Negative for: Chronic sinus problems/congestion; Middle ear problems Hematologic/Lymphatic Medical History: Negative for: Anemia; Hemophilia; Human Immunodeficiency Virus; Lymphedema; Sickle Cell Disease Respiratory Medical History: Negative for: Aspiration; Asthma; Chronic Obstructive Pulmonary Disease (COPD); Pneumothorax; Sleep Apnea; Tuberculosis Cardiovascular Medical History: Positive for: Arrhythmia - Afib; Hypertension Negative for: Angina; Congestive Heart Failure; Coronary Artery Disease; Deep Vein Thrombosis; Hypotension; Myocardial Infarction; Peripheral Arterial Disease; Peripheral Venous Disease; Phlebitis; Vasculitis Past Medical History Notes: "heart valve problem" Gastrointestinal Medical History: Negative for: Cirrhosis ; Colitis; Crohnos; Hepatitis A; Hepatitis B; Hepatitis C Endocrine Medical History: Positive for: Type II Diabetes Negative for: Type I Diabetes Time with diabetes: since 2007 Treated with: Insulin, Oral agents Blood sugar tested every day: Yes Tested : daily Genitourinary Medical History: Negative for: End Stage Renal Disease Immunological Medical History: Negative for: Lupus Erythematosus; Raynaudos; Scleroderma Integumentary (Skin) MARIDEL, PIXLER ([id number]) Medical History: Negative for: History of Burn; History of pressure wounds Musculoskeletal Medical History: Positive for: Osteoarthritis Negative for: Gout; Rheumatoid Arthritis; Osteomyelitis Neurologic Medical History: Positive for: Neuropathy - fingers and feet Negative for: Dementia; Quadriplegia; Paraplegia; Seizure Disorder Oncologic Medical  History: Negative for: Received Chemotherapy; Received Radiation Psychiatric Medical History: Negative for: Anorexia/bulimia; Confinement Anxiety HBO Extended History Items Eyes: Cataracts Immunizations Pneumococcal Vaccine: Received Pneumococcal Vaccination: Yes Received Pneumococcal Vaccination On or After 60th Birthday: No Implantable Devices Yes Family and Social History Cancer: Yes - Maternal Grandparents,Paternal Grandparents,Mother,Father; Diabetes: Yes - Maternal Grandparents,Paternal Grandparents,Mother,Siblings; Heart Disease: Yes - Father,Siblings; Hereditary Spherocytosis: No; Hypertension: Yes - Siblings,Father,Mother; Kidney Disease: No; Lung Disease: No; Seizures: No; Stroke: No; Thyroid Problems: No; Tuberculosis: No; Never smoker; Marital Status - Single; Alcohol Use: Never; Drug Use: No History; Caffeine Use: Never; Financial Concerns: Yes; Food, Clothing or Shelter Needs: No; Support System Lacking: No; Transportation Concerns: No Electronic Signature(s) Signed: 05/04/2021 4:08:09 PM By: Kalman Shan DO Signed: 05/04/2021 5:42:47 PM By: Gretta Cool, BSN, RN, CWS, Kim RN, BSN Entered By: Kalman Shan on 05/04/2021 16:04:43 [name] ([id number]) -------------------------------------------------------------------------------- [place] [place] Details Patient Name: [name]. Date of Service: 05/04/2021 Medical Record Number: [id number] Patient Account Number: [id number] Date of Birth/Sex: 1950/12/27 (70 y.o. F) Treating RN: Cornell Barman Primary Care Provider: Rutherford Guys Other Clinician: Referring Provider: Rutherford Guys Treating Provider/Extender: Yaakov Guthrie in Treatment: 5 Diagnosis Coding ICD-10 Codes Code Description [phone] Chronic venous hypertension (idiopathic) with ulcer and inflammation of right lower extremity I87.332 Chronic venous hypertension (idiopathic) with ulcer and inflammation of left lower extremity S91.302D Unspecified open  wound, left foot, subsequent encounter Facility Procedures CPT4: Description Modifier Quantity Code [id number] BILATERAL: Application of multi-layer venous compression system; leg (below knee), including 1 ankle and foot. Physician Procedures CPT4 Code Description: [id number] - WC PHYS LEVEL 3 - EST PT Modifier: Quantity: 1 CPT4 Code Description: ICD-10 Diagnosis Description I87.331 Chronic venous hypertension (idiopathic) with ulcer and inflammation of I87.332 Chronic venous hypertension (idiopathic) with ulcer and inflammation of S91.302D Unspecified open wound, left  foot, subsequent encounter Modifier: right lower extremit left lower extremity Quantity: y Engineer, maintenance) Signed: 05/04/2021 4:44:41 PM By: Kalman Shan DO Signed: 05/04/2021 4:51:20 PM By: Donnamarie Poag Previous Signature: 05/04/2021 4:08:09 PM Version By: Kalman Shan DO Entered By: Donnamarie Poag on 05/04/2021 16:41:08

## 2021-05-09 ENCOUNTER — Other Ambulatory Visit (INDEPENDENT_AMBULATORY_CARE_PROVIDER_SITE_OTHER): Payer: Self-pay | Admitting: Internal Medicine

## 2021-05-09 DIAGNOSIS — L97929 Non-pressure chronic ulcer of unspecified part of left lower leg with unspecified severity: Secondary | ICD-10-CM

## 2021-05-09 DIAGNOSIS — I87331 Chronic venous hypertension (idiopathic) with ulcer and inflammation of right lower extremity: Secondary | ICD-10-CM

## 2021-05-09 DIAGNOSIS — I87332 Chronic venous hypertension (idiopathic) with ulcer and inflammation of left lower extremity: Secondary | ICD-10-CM

## 2021-05-11 ENCOUNTER — Encounter: Payer: Medicare Other | Admitting: Internal Medicine

## 2021-05-11 ENCOUNTER — Other Ambulatory Visit: Payer: Self-pay

## 2021-05-11 ENCOUNTER — Ambulatory Visit (INDEPENDENT_AMBULATORY_CARE_PROVIDER_SITE_OTHER): Payer: Medicare Other

## 2021-05-11 DIAGNOSIS — I87331 Chronic venous hypertension (idiopathic) with ulcer and inflammation of right lower extremity: Secondary | ICD-10-CM | POA: Diagnosis not present

## 2021-05-11 DIAGNOSIS — I87332 Chronic venous hypertension (idiopathic) with ulcer and inflammation of left lower extremity: Secondary | ICD-10-CM | POA: Diagnosis not present

## 2021-05-11 DIAGNOSIS — L97929 Non-pressure chronic ulcer of unspecified part of left lower leg with unspecified severity: Secondary | ICD-10-CM

## 2021-05-11 DIAGNOSIS — L97919 Non-pressure chronic ulcer of unspecified part of right lower leg with unspecified severity: Secondary | ICD-10-CM

## 2021-05-11 DIAGNOSIS — I87333 Chronic venous hypertension (idiopathic) with ulcer and inflammation of bilateral lower extremity: Secondary | ICD-10-CM | POA: Diagnosis not present

## 2021-05-11 NOTE — Progress Notes (Signed)
Miranda, [name] ([id number]) Visit Report for 05/11/2021 Arrival Information Details Patient Name: [name], [name]. Date of Service: 05/11/2021 2:30 PM Medical Record Number: [id number] Patient Account Number: [id number] Date of Birth/Sex: 21-Nov-1950 (70 y.o. F) Treating RN: Donnamarie Poag Primary Care Mackie Holness: Rutherford Guys Other Clinician: Referring Vanesa Renier: Rutherford Guys Treating Kelby Lotspeich/Extender: Yaakov Guthrie in Treatment: 6 Visit Information History Since Last Visit Added or deleted any medications: No Patient Arrived: [name] Had a fall or experienced change in No Arrival Time: 14:21 activities of daily living that may affect Accompanied By: self risk of falls: Transfer Assistance: None Hospitalized since last visit: No Patient Has Alerts: Yes Has Dressing in Place as Prescribed: Yes Patient Alerts: Patient on Blood Thinner Has Compression in Place as Prescribed: Yes DIABETIC Pain Present Now: Yes ELIQUIS Electronic Signature(s) Signed: 05/11/2021 3:53:07 PM By: Donnamarie Poag Entered By: Donnamarie Poag on 05/11/2021 14:21:47 [name] ([id number]) -------------------------------------------------------------------------------- Clinic Level of Care Assessment Details Patient Name: [name]. Date of Service: 05/11/2021 2:30 PM Medical Record Number: [id number] Patient Account Number: [id number] Date of Birth/Sex: [birth year] (70 y.o. F) Treating RN: Donnamarie Poag Primary Care Yilia Sacca: Rutherford Guys Other Clinician: Referring Revan Gendron: Rutherford Guys Treating Catherine Cubero/Extender: Yaakov Guthrie in Treatment: 6 Clinic Level of Care Assessment Items TOOL 1 Quantity Score []  - Use when EandM and Procedure is performed on INITIAL visit 0 ASSESSMENTS - Nursing Assessment / Reassessment []  - General Physical Exam (combine w/ comprehensive assessment (listed just below) when performed on new 0 pt. evals) []  - 0 Comprehensive Assessment (HX, ROS, Risk  Assessments, Wounds Hx, etc.) ASSESSMENTS - Wound and Skin Assessment / Reassessment []  - Dermatologic / Skin Assessment (not related to wound area) 0 ASSESSMENTS - Ostomy and/or Continence Assessment and Care []  - Incontinence Assessment and Management 0 []  - 0 Ostomy Care Assessment and Management (repouching, etc.) PROCESS - Coordination of Care []  - Simple Patient / Family Education for ongoing care 0 []  - 0 Complex (extensive) Patient / Family Education for ongoing care []  - 0 Staff obtains Programmer, systems, Records, Test Results / Process Orders []  - 0 Staff telephones HHA, Nursing Homes / Clarify orders / etc []  - 0 Routine Transfer to another Facility (non-emergent condition) []  - 0 Routine Hospital Admission (non-emergent condition) []  - 0 New Admissions / Biomedical engineer / Ordering NPWT, Apligraf, etc. []  - 0 Emergency Hospital Admission (emergent condition) PROCESS - Special Needs []  - Pediatric / Minor Patient Management 0 []  - 0 Isolation Patient Management []  - 0 Hearing / Language / Visual special needs []  - 0 Assessment of Community assistance (transportation, D/C planning, etc.) []  - 0 Additional assistance / Altered mentation []  - 0 Support Surface(s) Assessment (bed, cushion, seat, etc.) INTERVENTIONS - Miscellaneous []  - External ear exam 0 []  - 0 Patient Transfer (multiple staff / Civil Service fast streamer / Similar devices) []  - 0 Simple Staple / Suture removal (25 or less) []  - 0 Complex Staple / Suture removal (26 or more) []  - 0 Hypo/Hyperglycemic Management (do not check if billed separately) []  - 0 Ankle / Brachial Index (ABI) - do not check if billed separately Has the patient been seen at the hospital within the last three years: Yes Total Score: 0 Level Of Care: ____ [name] ([id number]) Electronic Signature(s) Signed: 05/11/2021 3:53:07 PM By: Donnamarie Poag Entered By: Donnamarie Poag on 05/11/2021 14:49:57 [name]  ([id number]) -------------------------------------------------------------------------------- Compression Therapy Details Patient Name: [name]. Date of Service: 05/11/2021 2:30 PM Medical  Record Number: [id number] Patient Account Number: [id number] Date of Birth/Sex: [birth year] (70 y.o. F) Treating RN: Donnamarie Poag Primary Care Sharonna Vinje: Rutherford Guys Other Clinician: Referring Kenesha Moshier: Rutherford Guys Treating Ryszard Socarras/Extender: Yaakov Guthrie in Treatment: 6 Compression Therapy Performed for Wound Assessment: Wound #5 Right,Medial Lower Leg Performed By: Clinician Donnamarie Poag, RN Compression Type: Three Layer Post Procedure Diagnosis Same as Pre-procedure Electronic Signature(s) Signed: 05/11/2021 3:53:07 PM By: Donnamarie Poag Entered By: Donnamarie Poag on 05/11/2021 14:44:52 [name] ([id number]) -------------------------------------------------------------------------------- Compression Therapy Details Patient Name: [name]. Date of Service: 05/11/2021 2:30 PM Medical Record Number: [id number] Patient Account Number: [id number] Date of Birth/Sex: 05/22/1951 (70 y.o. F) Treating RN: Donnamarie Poag Primary Care Annalia Metzger: Rutherford Guys Other Clinician: Referring Pieper Kasik: Rutherford Guys Treating Yogi Arther/Extender: Yaakov Guthrie in Treatment: 6 Compression Therapy Performed for Wound Assessment: Non-Wound Location Performed By: Clinician Donnamarie Poag, RN Compression Type: Three Layer Location: Lower Extremity, Left Post Procedure Diagnosis Same as Pre-procedure Electronic Signature(s) Signed: 05/11/2021 3:53:07 PM By: Donnamarie Poag Entered By: Donnamarie Poag on 05/11/2021 14:45:13 [name] ([id number]) -------------------------------------------------------------------------------- Encounter Discharge Information Details Patient Name: [name]. Date of Service: 05/11/2021 2:30 PM Medical Record Number: [id number] Patient Account Number:  [id number] Date of Birth/Sex: 1950-09-06 (70 y.o. F) Treating RN: Donnamarie Poag Primary Care [name]: Rutherford Guys Other Clinician: Referring Deep Bonawitz: Rutherford Guys Treating Edmund Holcomb/Extender: Yaakov Guthrie in Treatment: 6 Encounter Discharge Information Items Discharge Condition: Stable Ambulatory Status: Walker Discharge Destination: Home Transportation: Private Auto Accompanied By: self Schedule Follow-up Appointment: Yes Clinical Summary of Care: Electronic Signature(s) Signed: 05/11/2021 3:53:07 PM By: Donnamarie Poag Entered By: Donnamarie Poag on 05/11/2021 15:04:12 [name] ([id number]) -------------------------------------------------------------------------------- Lower Extremity Assessment Details Patient Name: [name]. Date of Service: 05/11/2021 2:30 PM Medical Record Number: [id number] Patient Account Number: [id number] Date of Birth/Sex: 04/09/1951 (70 y.o. F) Treating RN: Donnamarie Poag Primary Care Shameria Trimarco: Rutherford Guys Other Clinician: Referring Neill Jurewicz: Rutherford Guys Treating Tery Hoeger/Extender: Yaakov Guthrie in Treatment: 6 Edema Assessment Assessed: [Left: Yes] [Right: Yes] Edema: [Left: Yes] [Right: Yes] Calf Left: Right: Point of Measurement: 29 cm From Medial Instep 40.5 cm 50.5 cm Ankle Left: Right: Point of Measurement: 10 cm From Medial Instep 21.5 cm 22 cm Notes wrap compression wrap observed slid down below calf upon arrival and stated [place] had wrapped it on 05/09/21 Electronic Signature(s) Signed: 05/11/2021 3:53:07 PM By: Donnamarie Poag Entered By: Donnamarie Poag on 05/11/2021 14:40:51 [name] ([id number]) -------------------------------------------------------------------------------- Multi Wound Chart Details Patient Name: [name]. Date of Service: 05/11/2021 2:30 PM Medical Record Number: [id number] Patient Account Number: [id number] Date of Birth/Sex: May 11, 1951 (70 y.o. 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 Comorbid History: Cataracts, Arrhythmia, Cataracts, Arrhythmia, Cataracts, Arrhythmia, Hypertension, Type II Diabetes, Hypertension, Type II Diabetes, Hypertension, Type II Diabetes, Osteoarthritis, Neuropathy Osteoarthritis, Neuropathy Osteoarthritis, Neuropathy Date Acquired: 03/23/2021 02/09/2021 03/30/2021 Weeks of Treatment: 6 6 6  Wound Status: Healed - Epithelialized Open Open Clustered Wound: Yes No Yes Measurements L x W x D (cm) 0x0x0 0.3x0.2x0.1 0x0x0 Area (cm) : 0 0.047 0 Volume (cm) : 0 0.005 0 %  Reduction in Area: 100.00% 50.00% 100.00% % Reduction in Volume: 100.00% 44.40% 100.00% Classification: Full Thickness Without Exposed Grade 1 Full Thickness Without Exposed Support Structures Support Structures Exudate Amount: None Present Medium None Present Exudate Type: N/A Serosanguineous N/A Exudate Color: N/A red, brown N/A Granulation Amount: None Present (0%) Medium (34-66%) None Present (0%) Granulation Quality: N/A Red, Pink N/A Necrotic Amount: None Present (0%) Medium (34-66%) None Present (0%) Exposed Structures: Fascia: No Fascia: No N/A Fat Layer (Subcutaneous Tissue): Fat Layer (Subcutaneous Tissue): No No Tendon: No Tendon: No Muscle: No Muscle: No Joint: No Joint: No Bone: No Bone: No Epithelialization: Large (67-100%) None Large (67-100%) Procedures Performed: N/A N/A N/A Wound Number: 5 [id number]  N/A Photos: N/A [name] ([id number]) Wound Location: Right, Medial Lower Leg Right, Distal Lower Leg N/A Wounding Event: Gradually Appeared Gradually Appeared N/A Primary Etiology: Venous Leg Ulcer Venous Leg Ulcer N/A Comorbid History: Cataracts, Arrhythmia, Cataracts, Arrhythmia, N/A Hypertension, Type II Diabetes, Hypertension, Type II Diabetes, Osteoarthritis, Neuropathy Osteoarthritis, Neuropathy Date Acquired: 04/18/2021 04/27/2021 N/A Weeks of Treatment: 3 2 N/A Wound Status: Open Healed - Epithelialized N/A Clustered Wound: Yes No N/A Measurements L x W x D (cm) 2x2x0.1 0x0x0 N/A Area (cm) : 3.142 0 N/A Volume (cm) : 0.314 0 N/A % Reduction in Area: -344.40% 100.00% N/A % Reduction in Volume: -122.70% 100.00% N/A Classification: Full Thickness Without Exposed Full Thickness Without Exposed N/A Support Structures Support Structures Exudate Amount: Large None Present N/A Exudate Type: Serous N/A N/A Exudate Color: amber N/A N/A Granulation Amount: Large (67-100%) None Present (0%) N/A Granulation Quality: Red, Pink N/A N/A Necrotic Amount: Small (1-33%) None Present (0%) N/A Exposed Structures: Fat Layer (Subcutaneous Tissue): N/A N/A Yes Fascia: No Tendon: No Muscle: No Joint: No Bone: No Epithelialization: N/A N/A N/A Procedures Performed: Compression Therapy N/A N/A Treatment Notes Wound #3 (Toe Great) Wound Laterality: Left Cleanser Peri-Wound Care Topical Primary Dressing Silvercel Small 2x2 (in/in) Discharge Instruction: Apply Silvercel Small 2x2 (in/in) as instructed Secondary Dressing Gauze Secured With 68M Medipore H Soft Cloth Surgical Tape, 2x2 (in/yd) Compression Wrap Compression Stockings Add-Ons Wound #4 (Lower Leg) Wound Laterality: Left Cleanser Peri-Wound Care Topical Primary Dressing Secondary Dressing SHANDI, GODFREY ([id number]) Secured With Compression Wrap Compression Stockings Add-Ons Wound #5 (Lower Leg) Wound  Laterality: Right, Medial Cleanser Wound Cleanser Discharge Instruction: Wash your hands with soap and water. Remove old dressing, discard into plastic bag and place into trash. Cleanse the wound with Wound Cleanser prior to applying a clean dressing using gauze sponges, not tissues or cotton balls. Do not scrub or use excessive force. Pat dry using gauze sponges, not tissue or cotton balls. Peri-Wound Care Topical Primary Dressing Silvercel Small 2x2 (in/in) Discharge Instruction: Apply Silvercel Small 2x2 (in/in) as instructed Secondary Dressing Xtrasorb Medium 4x5 (in/in) Discharge Instruction: Apply to wound as directed. Do not cut. Secured With Compression Wrap Profore Lite LF 3 Multilayer Compression Bandaging System Discharge Instruction: Apply 3 multi-layer wrap as prescribed. Compression Stockings Add-Ons Electronic Signature(s) Signed: 05/11/2021 3:13:26 PM By: Kalman Shan DO Entered By: Kalman Shan on 05/11/2021 15:04:55 [name] ([id number]) -------------------------------------------------------------------------------- [place] Details Patient Name: [name]. Date of Service: 05/11/2021 2:30 PM Medical Record Number: [id number] Patient Account Number: [id number] Date of Birth/Sex: January 11, 1951 (70 y.o. F) Treating RN: Donnamarie Poag Primary Care Kyandra Mcclaine: Rutherford Guys Other Clinician: Referring Jahaira Earnhart: Rutherford Guys Treating Janye Maynor/Extender: Yaakov Guthrie in Treatment: 6 Active Inactive Wound/Skin Impairment Nursing Diagnoses: Impaired tissue integrity Goals: Patient/caregiver will  verbalize understanding of skin care regimen Date Initiated: 03/30/2021 Date Inactivated: 04/27/2021 Target Resolution Date: 03/30/2021 Goal Status: Met Ulcer/skin breakdown will have a volume reduction of 30% by week 4 Date Initiated: 03/30/2021 Date Inactivated: 05/11/2021 Target Resolution Date: 04/30/2021 Goal Status:  Met Ulcer/skin breakdown will have a volume reduction of 50% by week 8 Date Initiated: 03/30/2021 Target Resolution Date: 05/30/2021 Goal Status: Active Ulcer/skin breakdown will have a volume reduction of 80% by week 12 Date Initiated: 03/30/2021 Target Resolution Date: 06/30/2021 Goal Status: Active Ulcer/skin breakdown will heal within 14 weeks Date Initiated: 03/30/2021 Target Resolution Date: 07/31/2021 Goal Status: Active Interventions: Assess patient/caregiver ability to obtain necessary supplies Assess patient/caregiver ability to perform ulcer/skin care regimen upon admission and as needed Assess ulceration(s) every visit Treatment Activities: Patient referred to home care : 03/30/2021 Referred to DME Birdie Fetty for dressing supplies : 03/30/2021 Skin care regimen initiated : 03/30/2021 Notes: Electronic Signature(s) Signed: 05/11/2021 3:53:07 PM By: Donnamarie Poag Entered By: Donnamarie Poag on 05/11/2021 14:41:09 [name] ([id number]) -------------------------------------------------------------------------------- Pain Assessment Details Patient Name: [name]. Date of Service: 05/11/2021 2:30 PM Medical Record Number: [id number] Patient Account Number: [id number] Date of Birth/Sex: [birth year] (70 y.o. F) Treating RN: Donnamarie Poag Primary Care Arvilla Salada: Rutherford Guys Other Clinician: Referring Zi Newbury: Rutherford Guys Treating Kentrell Hallahan/Extender: Yaakov Guthrie in Treatment: 6 Active Problems Location of Pain Severity and Description of Pain Patient Has Paino Yes Site Locations Pain Location: Pain in Ulcers Rate the pain. Current Pain Level: 1 Pain Management and Medication Current Pain Management: Electronic Signature(s) Signed: 05/11/2021 3:53:07 PM By: Donnamarie Poag Entered By: Donnamarie Poag on 05/11/2021 14:29:56 [name] ([id number]) -------------------------------------------------------------------------------- Patient/Caregiver Education  Details Patient Name: [name]. Date of Service: 05/11/2021 2:30 PM Medical Record Number: [id number] Patient Account Number: [id number] Date of Birth/Gender: 02-08-1951 (70 y.o. F) Treating RN: Donnamarie Poag Primary Care Physician: Rutherford Guys Other Clinician: Referring Physician: Rutherford Guys Treating Physician/Extender: Yaakov Guthrie in Treatment: 6 Education Assessment Education Provided To: Patient Education Topics Provided Wound/Skin Impairment: Electronic Signature(s) Signed: 05/11/2021 3:53:07 PM By: Donnamarie Poag Entered By: Donnamarie Poag on 05/11/2021 14:50:13 [name] ([id number]) -------------------------------------------------------------------------------- Wound Assessment Details Patient Name: [name]. Date of Service: 05/11/2021 2:30 PM Medical Record Number: [id number] Patient Account Number: [id number] Date of Birth/Sex: 1951-05-04 (70 y.o. F) Treating RN: Donnamarie Poag Primary Care Fadi Menter: Rutherford Guys Other Clinician: Referring Shareena Nusz: Rutherford Guys Treating Khadijatou Borak/Extender: Yaakov Guthrie in Treatment: 6 Wound Status Wound Number: 2 Primary Venous Leg Ulcer Etiology: Wound Location: Right Lower Leg Wound Status: Healed - Epithelialized Wounding Event: Blister Comorbid Cataracts, Arrhythmia, Hypertension, Type II Diabetes, Date Acquired: 03/23/2021 History: Osteoarthritis, Neuropathy Weeks Of Treatment: 6 Clustered Wound: Yes Photos Wound Measurements Length: (cm) 0 Width: (cm) 0 Depth: (cm) 0 Area: (cm) Volume: (cm) % Reduction in Area: 100% % Reduction in Volume: 100% Epithelialization: Large (67-100%) 0 Tunneling: No 0 Undermining: No Wound Description Classification: Full Thickness Without Exposed Support Structures Exudate Amount: None Present Foul Odor After Cleansing: No Slough/Fibrino No Wound Bed Granulation Amount: None Present (0%) Exposed Structure Necrotic Amount: None Present  (0%) Fascia Exposed: No Fat Layer (Subcutaneous Tissue) Exposed: No Tendon Exposed: No Muscle Exposed: No Joint Exposed: No Bone Exposed: No Electronic Signature(s) Signed: 05/11/2021 3:53:07 PM By: Donnamarie Poag Entered By: Donnamarie Poag on 05/11/2021 14:36:12 [name] ([id number]) -------------------------------------------------------------------------------- Wound Assessment Details Patient Name: [name]. Date of Service: 05/11/2021 2:30 PM Medical Record Number: [id number] Patient Account Number: [id number] Date  of Birth/Sex: 06-05-1951 (70 y.o. F) Treating RN: Donnamarie Poag Primary Care Veeda Virgo: Rutherford Guys Other Clinician: Referring Sarah-Jane Nazario: Rutherford Guys Treating Hellena Pridgen/Extender: Yaakov Guthrie in Treatment: 6 Wound Status Wound Number: 3 Primary Diabetic Wound/Ulcer of the Lower Extremity Etiology: Wound Location: Left Toe Great Wound Status: Open Wounding Event: Gradually Appeared Comorbid Cataracts, Arrhythmia, Hypertension, Type II Diabetes, Date Acquired: 02/09/2021 History: Osteoarthritis, Neuropathy Weeks Of Treatment: 6 Clustered Wound: No Photos Wound Measurements Length: (cm) 0.3 % Reduc Width: (cm) 0.2 % Reduc Depth: (cm) 0.1 Epithel Area: (cm) 0.047 Tunnel Volume: (cm) 0.005 Underm tion in Area: 50% tion in Volume: 44.4% ialization: None ing: No ining: No Wound Description Classification: Grade 1 Foul O Exudate Amount: Medium Slough Exudate Type: Serosanguineous Exudate Color: red, brown dor After Cleansing: No /Fibrino Yes Wound Bed Granulation Amount: Medium (34-66%) Exposed Structure Granulation Quality: Red, Pink Fascia Exposed: No Necrotic Amount: Medium (34-66%) Fat Layer (Subcutaneous Tissue) Exposed: No Necrotic Quality: Adherent Slough Tendon Exposed: No Muscle Exposed: No Joint Exposed: No Bone Exposed: No Treatment Notes Wound #3 (Toe Great) Wound Laterality: Left Cleanser Peri-Wound  Care Topical Primary Dressing GENOA, FREYRE ([id number]) Silvercel Small 2x2 (in/in) Discharge Instruction: Apply Silvercel Small 2x2 (in/in) as instructed Secondary Dressing Gauze Secured With 58M Medipore H Soft Cloth Surgical Tape, 2x2 (in/yd) Compression Wrap Compression Stockings Add-Ons Electronic Signature(s) Signed: 05/11/2021 3:53:07 PM By: Donnamarie Poag Entered By: Donnamarie Poag on 05/11/2021 14:36:48 [name] ([id number]) -------------------------------------------------------------------------------- Wound Assessment Details Patient Name: [name]. Date of Service: 05/11/2021 2:30 PM Medical Record Number: [id number] Patient Account Number: [id number] Date of Birth/Sex: [birth year] (70 y.o. F) Treating RN: Donnamarie Poag Primary Care Michille Mcelrath: Rutherford Guys Other Clinician: Referring Bion Todorov: Rutherford Guys Treating Koi Yarbro/Extender: Yaakov Guthrie in Treatment: 6 Wound Status Wound Number: 4 Primary Venous Leg Ulcer Etiology: Wound Location: Left Lower Leg Wound Status: Open Wounding Event: Blister Comorbid Cataracts, Arrhythmia, Hypertension, Type II Diabetes, Date Acquired: 03/30/2021 History: Osteoarthritis, Neuropathy Weeks Of Treatment: 6 Clustered Wound: Yes Photos Wound Measurements Length: (cm) 0 Width: (cm) 0 Depth: (cm) 0 Area: (cm) Volume: (cm) % Reduction in Area: 100% % Reduction in Volume: 100% Epithelialization: Large (67-100%) 0 Tunneling: No 0 Undermining: No Wound Description Classification: Full Thickness Without Exposed Support Structures Exudate Amount: None Present Foul Odor After Cleansing: No Slough/Fibrino No Wound Bed Granulation Amount: None Present (0%) Necrotic Amount: None Present (0%) Electronic Signature(s) Signed: 05/11/2021 3:53:07 PM By: Donnamarie Poag Entered By: Donnamarie Poag on 05/11/2021 14:37:36 [name]  ([id number]) -------------------------------------------------------------------------------- Wound Assessment Details Patient Name: [name]. Date of Service: 05/11/2021 2:30 PM Medical Record Number: [id number] Patient Account Number: [id number] Date of Birth/Sex: October 13, 1950 (70 y.o. F) Treating RN: Donnamarie Poag Primary Care Jacklyne Baik: Rutherford Guys Other Clinician: Referring Fraya Ueda: Rutherford Guys Treating Catlin Aycock/Extender: Yaakov Guthrie in Treatment: 6 Wound Status Wound Number: 5 Primary Venous Leg Ulcer Etiology: Wound Location: Right, Medial Lower Leg Wound Status: Open Wounding Event: Gradually Appeared Comorbid Cataracts, Arrhythmia, Hypertension, Type II Diabetes, Date Acquired: 04/18/2021 History: Osteoarthritis, Neuropathy Weeks Of Treatment: 3 Clustered Wound: Yes Photos Wound Measurements Length: (cm) 2 Width: (cm) 2 Depth: (cm) 0.1 Area: (cm) 3.142 Volume: (cm) 0.314 % Reduction in Area: -344.4% % Reduction in Volume: -122.7% Tunneling: No Undermining: No Wound Description Classification: Full Thickness Without Exposed Support Structures Exudate Amount: Large Exudate Type: Serous Exudate Color: amber Foul Odor After Cleansing: No Slough/Fibrino Yes Wound Bed Granulation Amount: Large (67-100%) Exposed Structure Granulation Quality: Red, Pink Fascia Exposed: No  Necrotic Amount: Small (1-33%) Fat Layer (Subcutaneous Tissue) Exposed: Yes Necrotic Quality: Adherent Slough Tendon Exposed: No Muscle Exposed: No Joint Exposed: No Bone Exposed: No Treatment Notes Wound #5 (Lower Leg) Wound Laterality: Right, Medial Cleanser Wound Cleanser Discharge Instruction: Wash your hands with soap and water. Remove old dressing, discard into plastic bag and place into trash. Cleanse the wound with Wound Cleanser prior to applying a clean dressing using gauze sponges, not tissues or cotton balls. Do not scrub or use excessive force. Pat dry using  gauze sponges, not tissue or cotton balls. SUNG, RENTON ([id number]) Peri-Wound Care Topical Primary Dressing Silvercel Small 2x2 (in/in) Discharge Instruction: Apply Silvercel Small 2x2 (in/in) as instructed Secondary Dressing Xtrasorb Medium 4x5 (in/in) Discharge Instruction: Apply to wound as directed. Do not cut. Secured With Compression Wrap Profore Lite LF 3 Multilayer Compression Bandaging System Discharge Instruction: Apply 3 multi-layer wrap as prescribed. Compression Stockings Add-Ons Electronic Signature(s) Signed: 05/11/2021 3:53:07 PM By: Donnamarie Poag Entered By: Donnamarie Poag on 05/11/2021 14:38:21 [name] ([id number]) -------------------------------------------------------------------------------- Wound Assessment Details Patient Name: [name]. Date of Service: 05/11/2021 2:30 PM Medical Record Number: [id number] Patient Account Number: [id number] Date of Birth/Sex: 10/01/1950 (70 y.o. F) Treating RN: Donnamarie Poag Primary Care Lunell Robart: Rutherford Guys Other Clinician: Referring Nasiyah Laverdiere: Rutherford Guys Treating Kirin Brandenburger/Extender: Yaakov Guthrie in Treatment: 6 Wound Status Wound Number: 8 Primary Venous Leg Ulcer Etiology: Wound Location: Right, Distal Lower Leg Wound Status: Healed - Epithelialized Wounding Event: Gradually Appeared Comorbid Cataracts, Arrhythmia, Hypertension, Type II Diabetes, Date Acquired: 04/27/2021 History: Osteoarthritis, Neuropathy Weeks Of Treatment: 2 Clustered Wound: No Photos Wound Measurements Length: (cm) Width: (cm) Depth: (cm) Area: (cm) Volume: (cm) 0 % Reduction in Area: 100% 0 % Reduction in Volume: 100% 0 Tunneling: No 0 Undermining: No 0 Wound Description Classification: Full Thickness Without Exposed Support Structures Exudate Amount: None Present Foul Odor After Cleansing: No Slough/Fibrino No Wound Bed Granulation Amount: None Present (0%) Necrotic Amount: None Present  (0%) Electronic Signature(s) Signed: 05/11/2021 3:53:07 PM By: Donnamarie Poag Entered By: Donnamarie Poag on 05/11/2021 14:39:07 [name] ([id number]) -------------------------------------------------------------------------------- Preble Details Patient Name: [name]. Date of Service: 05/11/2021 2:30 PM Medical Record Number: [id number] Patient Account Number: [id number] Date of Birth/Sex: January 21, 1951 (70 y.o. F) Treating RN: Donnamarie Poag Primary Care Cornelius Marullo: Rutherford Guys Other Clinician: Referring Lien Lyman: Rutherford Guys Treating Ophia Shamoon/Extender: Yaakov Guthrie in Treatment: 6 Vital Signs Time Taken: 14:22 Temperature (F): 98.2 Height (in): 66 Pulse (bpm): 91 Weight (lbs): 332 Respiratory Rate (breaths/min): 20 Body Mass Index (BMI): 53.6 Blood Pressure (mmHg): 166/92 Reference Range: 80 - 120 mg / dl Electronic Signature(s) Signed: 05/11/2021 3:53:07 PM By: Donnamarie Poag Entered ByDonnamarie Poag on 05/11/2021 14:29:48

## 2021-05-11 NOTE — Progress Notes (Signed)
[name], [name] ([id number]) Visit Report for 05/11/2021 Chief Complaint Document Details Patient Name: [name], [name]. Date of Service: 05/11/2021 2:30 PM Medical Record Number: [id number] Patient Account Number: [id number] Date of Birth/Sex: 09/26/1950 (70 y.o. F) Treating RN: Donnamarie Poag Primary Care Provider: Rutherford Guys Other Clinician: Referring Provider: Rutherford Guys Treating Provider/Extender: Yaakov Guthrie in Treatment: 6 Information Obtained from: Patient Chief Complaint Bilateral lower extremity wounds Electronic Signature(s) Signed: 05/11/2021 3:13:26 PM By: Kalman Shan DO Entered By: Kalman Shan on 05/11/2021 15:05:17 [name] ([id number]) -------------------------------------------------------------------------------- HPI Details Patient Name: [name]. Date of Service: 05/11/2021 2:30 PM Medical Record Number: [id number] Patient Account Number: [id number] Date of Birth/Sex: July 03, 1950 (70 y.o. F) Treating RN: Donnamarie Poag Primary Care Provider: Rutherford Guys Other Clinician: Referring Provider: Rutherford Guys Treating Provider/Extender: Yaakov Guthrie in Treatment: 6 History of Present Illness HPI Description: Admission 10/5 [name] is a 70 year old female with a past medical history of hypertension, venous insufficiency, chronic diastolic heart failure and insulin-dependent type 2 diabetes that presents To the clinic for bilateral lower extremity wounds. She states that her right lower extremity developed a blister 1 week ago and she has been keeping the area covered with a bandaid. She states that when the Band-Aid came off it tore her skin. She subsequently developed pain, increased warmth and erythema to the right lower extremity. She denies purulent drainage. She has also developed an open wound to the left lower extremity that recently developed. She also has a small open wound to the proximal portion of the  left great toe. This has been present since August and was evaluated by podiatry. It has not healed and she has not followed up with podiatry for this issue. 10/12; patient presents for follow-up. She states she has 2 days left on her antibiotics. She is tolerated this well. She reports improvement to her right lower extremity symptoms. She denies signs of infection. She has home health. 10/26; patient presents for follow-up. She has no issues or complaints today. The compression wraps slid down to her shin. These were placed on Monday by home health. She has a couple of new open wounds due to this. She denies signs of infection. 11/2; patient presents for follow-up. The compression wraps had slid down to her shin again. The wraps taken off today do not appear to be appropriate material for 3 layer compression wrap. She currently denies signs of infection. 11/9; patient presents for follow-up. She was able to increase her diuretic over the past week. Again home health does not have appropriate materials for 3 layer compression. She currently denies signs of infection. 11/16; patient presents for follow-up. She has no issues or complaints today. She currently denies signs of infection. Electronic Signature(s) Signed: 05/11/2021 3:13:26 PM By: Kalman Shan DO Entered By: Kalman Shan on 05/11/2021 15:06:10 [name] ([id number]) -------------------------------------------------------------------------------- Physical Exam Details Patient Name: [name]. Date of Service: 05/11/2021 2:30 PM Medical Record Number: [id number] Patient Account Number: [id number] Date of Birth/Sex: 1951-03-08 (70 y.o. F) Treating RN: Donnamarie Poag Primary Care Provider: Rutherford Guys Other Clinician: Referring Provider: Rutherford Guys Treating Provider/Extender: Yaakov Guthrie in Treatment: 6 Constitutional . Cardiovascular . Psychiatric . Notes Scattered open wounds limited to skin  breakdown to the right lower extremity. Weeping noted to this leg. To the left great toe there is a small open wound with dried lymph fluid. Blistering noted to the left lower extremity. No signs of infection. Electronic Signature(s) Signed: 05/11/2021 3:13:26  PM By: Kalman Shan DO Entered By: Kalman Shan on 05/11/2021 15:07:45 [name] ([id number]) -------------------------------------------------------------------------------- Physician Orders Details Patient Name: [name]. Date of Service: 05/11/2021 2:30 PM Medical Record Number: [id number] Patient Account Number: [id number] Date of Birth/Sex: 05/19/1951 (70 y.o. F) Treating RN: Donnamarie Poag Primary Care Provider: Rutherford Guys Other Clinician: Referring Provider: Rutherford Guys Treating Provider/Extender: Yaakov Guthrie in Treatment: 6 Verbal / Phone Orders: No Diagnosis Coding Follow-up Appointments o Return Appointment in 1 week. o Nurse Visit as needed [place] for wound care. May utilize formulary equivalent dressing for wound treatment orders unless otherwise specified. Home Health Nurse may visit PRN to address patientos wound care needs. o Scheduled days for dressing changes to be completed; exception, patient has scheduled wound care visit that day. o **Please direct any NON-WOUND related issues/requests for orders to patient's Primary Care Physician. **If current dressing causes regression in wound condition, may D/C ordered dressing product/s and apply Normal Saline Moist Dressing daily until next [place] or Other MD appointment. **Notify W[facility]aling Center of regression in wound condition at [place] Bathing/ Shower/ Hygiene o May shower with wound dressing protected with water repellent cover or cast protector. o No tub bath. Edema Control - Lymphedema / Segmental  Compressive Device / Other o Optional: One layer of unna paste to top of compression wrap (to act as an anchor). o 3 Layer Compression System for Lymphedema. - bilateral lower legs o Elevate leg(s) parallel to the floor when sitting. o DO YOUR BEST to sleep in the bed at night. DO NOT sleep in your recliner. Long hours of sitting in a recliner leads to swelling of the legs and/or potential wounds on your backside. Non-Wound Condition o Additional non-wound orders/instructions: - apply silver alginate to blister type area left lower leg and gauze Additional Orders / Instructions o Follow Nutritious Diet and Increase Protein Intake Wound Treatment Wound #3 - Toe Great Wound Laterality: Left Primary Dressing: Silvercel Small 2x2 (in/in) 3 x Per Week/30 Days Discharge Instructions: Apply Silvercel Small 2x2 (in/in) as instructed Secondary Dressing: Gauze 3 x Per Week/30 Days Secured With: 58M Medipore H Soft Cloth Surgical Tape, 2x2 (in/yd) 3 x Per Week/30 Days Wound #5 - Lower Leg Wound Laterality: Right, Medial Cleanser: Wound Cleanser 3 x Per Week/30 Days Discharge Instructions: Wash your hands with soap and water. Remove old dressing, discard into plastic bag and place into trash. Cleanse the wound with Wound Cleanser prior to applying a clean dressing using gauze sponges, not tissues or cotton balls. Do not scrub or use excessive force. Pat dry using gauze sponges, not tissue or cotton balls. Primary Dressing: Silvercel Small 2x2 (in/in) 3 x Per Week/30 Days Discharge Instructions: Apply Silvercel Small 2x2 (in/in) as instructed Secondary Dressing: Xtrasorb Medium 4x5 (in/in) 3 x Per Week/30 Days Discharge Instructions: Apply to wound as directed. Do not cut. Compression Wrap: Profore Lite LF 3 Multilayer Compression Bandaging System (Generic) 3 x Per Week/30 Days Discharge Instructions: Apply 3 multi-layer wrap as prescribed. [name], [name] ([id number]) Electronic  Signature(s) Signed: 05/11/2021 3:13:26 PM By: Kalman Shan DO Signed: 05/11/2021 3:53:07 PM By: Donnamarie Poag Entered By: Donnamarie Poag on 05/11/2021 14:49:08 [name] ([id number]) -------------------------------------------------------------------------------- Problem List Details Patient Name: COLANDRA, OHANIAN. Date of Service: 05/11/2021 2:30 PM Medical Record Number: [id number] Patient Account Number: [id number] Date of Birth/Sex: December 09, 1950 (70 y.o. F) Treating RN: Donnamarie Poag  Primary Care Provider: Rutherford Guys Other Clinician: Referring Provider: Rutherford Guys Treating Provider/Extender: Yaakov Guthrie in Treatment: 6 Active Problems ICD-10 Encounter Code Description Active Date MDM Diagnosis I87.331 Chronic venous hypertension (idiopathic) with ulcer and inflammation of 03/30/2021 No Yes right lower extremity I87.332 Chronic venous hypertension (idiopathic) with ulcer and inflammation of 03/30/2021 No Yes left lower extremity S91.302D Unspecified open wound, left foot, subsequent encounter 03/30/2021 No Yes Inactive Problems Resolved Problems Electronic Signature(s) Signed: 05/11/2021 3:13:26 PM By: Kalman Shan DO Entered By: Kalman Shan on 05/11/2021 15:04:45 [name] ([id number]) -------------------------------------------------------------------------------- Progress Note Details Patient Name: [name]. Date of Service: 05/11/2021 2:30 PM Medical Record Number: [id number] Patient Account Number: [id number] Date of Birth/Sex: Apr 18, 1951 (70 y.o. F) Treating RN: Donnamarie Poag Primary Care Provider: Rutherford Guys Other Clinician: Referring Provider: Rutherford Guys Treating Provider/Extender: Yaakov Guthrie in Treatment: 6 Subjective Chief Complaint Information obtained from Patient Bilateral lower extremity wounds History of Present Illness (HPI) Admission 10/5 Ms. Chrystine Frogge is a 70 year old female with a past medical  history of hypertension, venous insufficiency, chronic diastolic heart failure and insulin-dependent type 2 diabetes that presents To the clinic for bilateral lower extremity wounds. She states that her right lower extremity developed a blister 1 week ago and she has been keeping the area covered with a bandaid. She states that when the Band-Aid came off it tore her skin. She subsequently developed pain, increased warmth and erythema to the right lower extremity. She denies purulent drainage. She has also developed an open wound to the left lower extremity that recently developed. She also has a small open wound to the proximal portion of the left great toe. This has been present since August and was evaluated by podiatry. It has not healed and she has not followed up with podiatry for this issue. 10/12; patient presents for follow-up. She states she has 2 days left on her antibiotics. She is tolerated this well. She reports improvement to her right lower extremity symptoms. She denies signs of infection. She has home health. 10/26; patient presents for follow-up. She has no issues or complaints today. The compression wraps slid down to her shin. These were placed on Monday by home health. She has a couple of new open wounds due to this. She denies signs of infection. 11/2; patient presents for follow-up. The compression wraps had slid down to her shin again. The wraps taken off today do not appear to be appropriate material for 3 layer compression wrap. She currently denies signs of infection. 11/9; patient presents for follow-up. She was able to increase her diuretic over the past week. Again home health does not have appropriate materials for 3 layer compression. She currently denies signs of infection. 11/16; patient presents for follow-up. She has no issues or complaints today. She currently denies signs of infection. Patient History Information obtained from Patient. Family History Cancer -  Maternal Grandparents,Paternal Grandparents,Mother,Father, Diabetes - Maternal Grandparents,Paternal Grandparents,Mother,Siblings, Heart Disease - Father,Siblings, Hypertension - Siblings,Father,Mother, No family history of Hereditary Spherocytosis, Kidney Disease, Lung Disease, Seizures, Stroke, Thyroid Problems, Tuberculosis. Social History Never smoker, Marital Status - Single, Alcohol Use - Never, Drug Use - No History, Caffeine Use - Never. Medical History Eyes Patient has history of Cataracts Denies history of Glaucoma, Optic Neuritis Ear/Nose/Mouth/Throat Denies history of Chronic sinus problems/congestion, Middle ear problems Hematologic/Lymphatic Denies history of Anemia, Hemophilia, Human Immunodeficiency Virus, Lymphedema, Sickle Cell Disease Respiratory Denies history of Aspiration, Asthma, Chronic Obstructive Pulmonary Disease (COPD), Pneumothorax, Sleep Apnea, Tuberculosis  Cardiovascular Patient has history of Arrhythmia - Afib, Hypertension Denies history of Angina, Congestive Heart Failure, Coronary Artery Disease, Deep Vein Thrombosis, Hypotension, Myocardial Infarction, Peripheral Arterial Disease, Peripheral Venous Disease, Phlebitis, Vasculitis Gastrointestinal Denies history of Cirrhosis , Colitis, Crohn s, Hepatitis A, Hepatitis B, Hepatitis C Endocrine Patient has history of Type II Diabetes Denies history of Type I Diabetes Genitourinary Denies history of End Stage Renal Disease Immunological Denies history of Lupus Erythematosus, Raynaud s, Scleroderma Integumentary (Skin) Denies history of History of Burn, History of pressure wounds [name], [name] ([id number]) Musculoskeletal Patient has history of Osteoarthritis Denies history of Gout, Rheumatoid Arthritis, Osteomyelitis Neurologic Patient has history of Neuropathy - fingers and feet Denies history of Dementia, Quadriplegia, Paraplegia, Seizure Disorder Oncologic Denies history of Received  Chemotherapy, Received Radiation Psychiatric Denies history of Anorexia/bulimia, Confinement Anxiety Medical And Surgical History Notes Cardiovascular "heart valve problem" Objective Constitutional Vitals Time Taken: 2:22 PM, Height: 66 in, Weight: 332 lbs, BMI: 53.6, Temperature: 98.2 F, Pulse: 91 bpm, Respiratory Rate: 20 breaths/min, Blood Pressure: 166/92 mmHg. General Notes: Scattered open wounds limited to skin breakdown to the right lower extremity. Weeping noted to this leg. To the left great toe there is a small open wound with dried lymph fluid. Blistering noted to the left lower extremity. No signs of infection. Integumentary (Hair, Skin) Wound #2 status is Healed - Epithelialized. Original cause of wound was Blister. The date acquired was: 03/23/2021. The wound has been in treatment 6 weeks. The wound is located on the Right Lower Leg. The wound measures 0cm length x 0cm width x 0cm depth; 0cm^2 area and 0cm^3 volume. There is no tunneling or undermining noted. There is a none present amount of drainage noted. There is no granulation within the wound bed. There is no necrotic tissue within the wound bed. Wound #3 status is Open. Original cause of wound was Gradually Appeared. The date acquired was: 02/09/2021. The wound has been in treatment 6 weeks. The wound is located on the Left Toe Great. The wound measures 0.3cm length x 0.2cm width x 0.1cm depth; 0.047cm^2 area and 0.005cm^3 volume. There is no tunneling or undermining noted. There is a medium amount of serosanguineous drainage noted. There is medium (34-66%) red, pink granulation within the wound bed. There is a medium (34-66%) amount of necrotic tissue within the wound bed including Adherent Slough. Wound #4 status is Open. Original cause of wound was Blister. The date acquired was: 03/30/2021. The wound has been in treatment 6 weeks. The wound is located on the Left Lower Leg. The wound measures 0cm length x 0cm width x  0cm depth; 0cm^2 area and 0cm^3 volume. There is no tunneling or undermining noted. There is a none present amount of drainage noted. There is no granulation within the wound bed. There is no necrotic tissue within the wound bed. Wound #5 status is Open. Original cause of wound was Gradually Appeared. The date acquired was: 04/18/2021. The wound has been in treatment 3 weeks. The wound is located on the Right,Medial Lower Leg. The wound measures 2cm length x 2cm width x 0.1cm depth; 3.142cm^2 area and 0.314cm^3 volume. There is Fat Layer (Subcutaneous Tissue) exposed. There is no tunneling or undermining noted. There is a large amount of serous drainage noted. There is large (67-100%) red, pink granulation within the wound bed. There is a small (1-33%) amount of necrotic tissue within the wound bed including Adherent Slough. Wound #8 status is Healed - Epithelialized. Original cause of wound was  Gradually Appeared. The date acquired was: 04/27/2021. The wound has been in treatment 2 weeks. The wound is located on the Right,Distal Lower Leg. The wound measures 0cm length x 0cm width x 0cm depth; 0cm^2 area and 0cm^3 volume. There is no tunneling or undermining noted. There is a none present amount of drainage noted. There is no granulation within the wound bed. There is no necrotic tissue within the wound bed. Assessment Active Problems ICD-10 Chronic venous hypertension (idiopathic) with ulcer and inflammation of right lower extremity Chronic venous hypertension (idiopathic) with ulcer and inflammation of left lower extremity Unspecified open wound, left foot, subsequent encounter [name], [name]. ([id number]) Patient continues to have wounds to her right lower extremity limited to skin breakdown. I recommended silver alginate to this area. The left lower extremity only has 1 open wound to the toe. Recommend continuing Hydrofera Blue. She can continue with the increased dose of her Lasix for 1  more week. We will continue compression wraps. Patient had venous reflux studies completed today. Follow-up in 1 week. Procedures Wound #5 Pre-procedure diagnosis of Wound #5 is a Venous Leg Ulcer located on the Right,Medial Lower Leg . There was a Three Layer Compression Therapy Procedure by Donnamarie Poag, RN. Post procedure Diagnosis Wound #5: Same as Pre-Procedure There was a Three Layer Compression Therapy Procedure by Donnamarie Poag, RN. Post procedure Diagnosis Wound #: Same as Pre-Procedure Plan Follow-up Appointments: Return Appointment in 1 week. Nurse Visit as needed Home Health: [place]: - [place] for wound care. May utilize formulary equivalent dressing for wound treatment orders unless otherwise specified. Home Health Nurse may visit PRN to address patient s wound care needs. Scheduled days for dressing changes to be completed; exception, patient has scheduled wound care visit that day. **Please direct any NON-WOUND related issues/requests for orders to patient's Primary Care Physician. **If current dressing causes regression in wound condition, may D/C ordered dressing product/s and apply Normal Saline Moist Dressing daily until next [place] or Other MD appointment. **Notify Wound Healing Center of regression in wound condition at [place] Bathing/ Shower/ Hygiene: May shower with wound dressing protected with water repellent cover or cast protector. No tub bath. Edema Control - Lymphedema / Segmental Compressive Device / Other: Optional: One layer of unna paste to top of compression wrap (to act as an anchor). 3 Layer Compression System for Lymphedema. - bilateral lower legs Elevate leg(s) parallel to the floor when sitting. DO YOUR BEST to sleep in the bed at night. DO NOT sleep in your recliner. Long hours of sitting in a recliner leads to swelling of the legs and/or potential wounds on your  backside. Non-Wound Condition: Additional non-wound orders/instructions: - apply silver alginate to blister type area left lower leg and gauze Additional Orders / Instructions: Follow Nutritious Diet and Increase Protein Intake WOUND #3: - Toe Great Wound Laterality: Left Primary Dressing: Silvercel Small 2x2 (in/in) 3 x Per Week/30 Days Discharge Instructions: Apply Silvercel Small 2x2 (in/in) as instructed Secondary Dressing: Gauze 3 x Per Week/30 Days Secured With: 16M Medipore H Soft Cloth Surgical Tape, 2x2 (in/yd) 3 x Per Week/30 Days WOUND #5: - Lower Leg Wound Laterality: Right, Medial Cleanser: Wound Cleanser 3 x Per Week/30 Days Discharge Instructions: Wash your hands with soap and water. Remove old dressing, discard into plastic bag and place into trash. Cleanse the wound with Wound Cleanser prior to applying a clean dressing using gauze sponges, not tissues or cotton  balls. Do not scrub or use excessive force. Pat dry using gauze sponges, not tissue or cotton balls. Primary Dressing: Silvercel Small 2x2 (in/in) 3 x Per Week/30 Days Discharge Instructions: Apply Silvercel Small 2x2 (in/in) as instructed Secondary Dressing: Xtrasorb Medium 4x5 (in/in) 3 x Per Week/30 Days Discharge Instructions: Apply to wound as directed. Do not cut. Compression Wrap: Profore Lite LF 3 Multilayer Compression Bandaging System (Generic) 3 x Per Week/30 Days Discharge Instructions: Apply 3 multi-layer wrap as prescribed. 1. Silver alginate 2. Hydrofera Blue 3. 3 layer compression 4. Follow-up in 1 week [name], [name] ([id number]) Electronic Signature(s) Signed: 05/11/2021 3:13:26 PM By: Kalman Shan DO Entered By: Kalman Shan on 05/11/2021 15:12:50 [name] ([id number]) -------------------------------------------------------------------------------- ROS/PFSH Details Patient Name: [name]. Date of Service: 05/11/2021 2:30 PM Medical Record Number: [id number] Patient  Account Number: [id number] Date of Birth/Sex: Aug 08, 1950 (70 y.o. F) Treating RN: Donnamarie Poag Primary Care Provider: Rutherford Guys Other Clinician: Referring Provider: Rutherford Guys Treating Provider/Extender: Yaakov Guthrie in Treatment: 6 Information Obtained From Patient Eyes Medical History: Positive for: Cataracts Negative for: Glaucoma; Optic Neuritis Ear/Nose/Mouth/Throat Medical History: Negative for: Chronic sinus problems/congestion; Middle ear problems Hematologic/Lymphatic Medical History: Negative for: Anemia; Hemophilia; Human Immunodeficiency Virus; Lymphedema; Sickle Cell Disease Respiratory Medical History: Negative for: Aspiration; Asthma; Chronic Obstructive Pulmonary Disease (COPD); Pneumothorax; Sleep Apnea; Tuberculosis Cardiovascular Medical History: Positive for: Arrhythmia - Afib; Hypertension Negative for: Angina; Congestive Heart Failure; Coronary Artery Disease; Deep Vein Thrombosis; Hypotension; Myocardial Infarction; Peripheral Arterial Disease; Peripheral Venous Disease; Phlebitis; Vasculitis Past Medical History Notes: "heart valve problem" Gastrointestinal Medical History: Negative for: Cirrhosis ; Colitis; Crohnos; Hepatitis A; Hepatitis B; Hepatitis C Endocrine Medical History: Positive for: Type II Diabetes Negative for: Type I Diabetes Time with diabetes: since 2007 Treated with: Insulin, Oral agents Blood sugar tested every day: Yes Tested : daily Genitourinary Medical History: Negative for: End Stage Renal Disease Immunological Medical History: Negative for: Lupus Erythematosus; Raynaudos; Scleroderma Integumentary (Skin) [name], [name] ([id number]) Medical History: Negative for: History of Burn; History of pressure wounds Musculoskeletal Medical History: Positive for: Osteoarthritis Negative for: Gout; Rheumatoid Arthritis; Osteomyelitis Neurologic Medical History: Positive for: Neuropathy - fingers and  feet Negative for: Dementia; Quadriplegia; Paraplegia; Seizure Disorder Oncologic Medical History: Negative for: Received Chemotherapy; Received Radiation Psychiatric Medical History: Negative for: Anorexia/bulimia; Confinement Anxiety HBO Extended History Items Eyes: Cataracts Immunizations Pneumococcal Vaccine: Received Pneumococcal Vaccination: Yes Received Pneumococcal Vaccination On or After 60th Birthday: No Implantable Devices Yes Family and Social History Cancer: Yes - Maternal Grandparents,Paternal Grandparents,Mother,Father; Diabetes: Yes - Maternal Grandparents,Paternal Grandparents,Mother,Siblings; Heart Disease: Yes - Father,Siblings; Hereditary Spherocytosis: No; Hypertension: Yes - Siblings,Father,Mother; Kidney Disease: No; Lung Disease: No; Seizures: No; Stroke: No; Thyroid Problems: No; Tuberculosis: No; Never smoker; Marital Status - Single; Alcohol Use: Never; Drug Use: No History; Caffeine Use: Never; Financial Concerns: Yes; Food, Clothing or Shelter Needs: No; Support System Lacking: No; Transportation Concerns: No Electronic Signature(s) Signed: 05/11/2021 3:13:26 PM By: Kalman Shan DO Signed: 05/11/2021 3:53:07 PM By: Donnamarie Poag Entered By: Kalman Shan on 05/11/2021 15:06:19 [name] ([id number]) -------------------------------------------------------------------------------- [place] Details Patient Name: [name]. Date of Service: 05/11/2021 Medical Record Number: [id number] Patient Account Number: [id number] Date of Birth/Sex: [birth year] (70 y.o. F) Treating RN: Donnamarie Poag Primary Care Provider: Rutherford Guys Other Clinician: Referring Provider: Rutherford Guys Treating Provider/Extender: Yaakov Guthrie in Treatment: 6 Diagnosis Coding ICD-10 Codes Code Description [phone] Chronic venous hypertension (idiopathic) with ulcer and inflammation of right lower extremity I87.332 Chronic venous  hypertension (idiopathic)  with ulcer and inflammation of left lower extremity S91.302D Unspecified open wound, left foot, subsequent encounter Facility Procedures CPT4: Description Modifier Quantity Code [id number] BILATERAL: Application of multi-layer venous compression system; leg (below knee), including 1 ankle and foot. Physician Procedures CPT4 Code Description: [id number] - WC PHYS LEVEL 3 - EST PT Modifier: Quantity: 1 CPT4 Code Description: ICD-10 Diagnosis Description I87.331 Chronic venous hypertension (idiopathic) with ulcer and inflammation of I87.332 Chronic venous hypertension (idiopathic) with ulcer and inflammation of S91.302D Unspecified open wound, left  foot, subsequent encounter Modifier: right lower extremit left lower extremity Quantity: y Engineer, maintenance) Signed: 05/11/2021 3:13:26 PM By: Kalman Shan DO Entered By: Kalman Shan on 05/11/2021 15:13:02

## 2021-05-18 ENCOUNTER — Other Ambulatory Visit: Payer: Self-pay

## 2021-05-18 ENCOUNTER — Encounter: Payer: Medicare Other | Admitting: Internal Medicine

## 2021-05-18 DIAGNOSIS — I87333 Chronic venous hypertension (idiopathic) with ulcer and inflammation of bilateral lower extremity: Secondary | ICD-10-CM | POA: Diagnosis not present

## 2021-05-25 ENCOUNTER — Other Ambulatory Visit: Payer: Self-pay

## 2021-05-25 ENCOUNTER — Encounter (HOSPITAL_BASED_OUTPATIENT_CLINIC_OR_DEPARTMENT_OTHER): Payer: Medicare Other | Admitting: Internal Medicine

## 2021-05-25 DIAGNOSIS — I87332 Chronic venous hypertension (idiopathic) with ulcer and inflammation of left lower extremity: Secondary | ICD-10-CM | POA: Diagnosis not present

## 2021-05-25 DIAGNOSIS — I87333 Chronic venous hypertension (idiopathic) with ulcer and inflammation of bilateral lower extremity: Secondary | ICD-10-CM | POA: Diagnosis not present

## 2021-05-25 NOTE — Progress Notes (Signed)
STELLAH, DONOVAN ([id number]) Visit Report for 05/25/2021 Arrival Information Details Patient Name: [name], [name]. Date of Service: 05/25/2021 2:30 PM Medical Record Number: [id number] Patient Account Number: [id number] Date of Birth/Sex: 24-Oct-1950 (70 y.o. F) Treating RN: Donnamarie Poag Primary Care Madalee Altmann: Rutherford Guys Other Clinician: Referring Elyana Grabski: Rutherford Guys Treating Lakyia Behe/Extender: Yaakov Guthrie in Treatment: 8 Visit Information History Since Last Visit Added or deleted any medications: No Patient Arrived: [name] Had a fall or experienced change in No Arrival Time: 14:24 activities of daily living that may affect Accompanied By: self risk of falls: Transfer Assistance: None Hospitalized since last visit: No Patient Identification Verified: Yes Has Dressing in Place as Prescribed: Yes Secondary Verification Process Completed: Yes Has Compression in Place as Prescribed: Yes Patient Requires Transmission-Based No Pain Present Now: No Precautions: Patient Has Alerts: Yes Patient Alerts: Patient on Blood Thinner DIABETIC ELIQUIS Electronic Signature(s) Signed: 05/25/2021 2:54:54 PM By: Donnamarie Poag Entered By: Donnamarie Poag on 05/25/2021 14:24:30 [name] ([id number]) -------------------------------------------------------------------------------- Clinic Level of Care Assessment Details Patient Name: [name]. Date of Service: 05/25/2021 2:30 PM Medical Record Number: [id number] Patient Account Number: [id number] Date of Birth/Sex: 03/11/1951 (70 y.o. F) Treating RN: Donnamarie Poag Primary Care Shalunda Lindh: Rutherford Guys Other Clinician: Referring Jazlynne Milliner: Rutherford Guys Treating Guerline Happ/Extender: Yaakov Guthrie in Treatment: 8 Clinic Level of Care Assessment Items TOOL 1 Quantity Score []  - Use when EandM and Procedure is performed on INITIAL visit 0 ASSESSMENTS - Nursing Assessment / Reassessment []  - General Physical Exam (combine  w/ comprehensive assessment (listed just below) when performed on new 0 pt. evals) []  - 0 Comprehensive Assessment (HX, ROS, Risk Assessments, Wounds Hx, etc.) ASSESSMENTS - Wound and Skin Assessment / Reassessment []  - Dermatologic / Skin Assessment (not related to wound area) 0 ASSESSMENTS - Ostomy and/or Continence Assessment and Care []  - Incontinence Assessment and Management 0 []  - 0 Ostomy Care Assessment and Management (repouching, etc.) PROCESS - Coordination of Care []  - Simple Patient / Family Education for ongoing care 0 []  - 0 Complex (extensive) Patient / Family Education for ongoing care []  - 0 Staff obtains Programmer, systems, Records, Test Results / Process Orders []  - 0 Staff telephones HHA, Nursing Homes / Clarify orders / etc []  - 0 Routine Transfer to another Facility (non-emergent condition) []  - 0 Routine Hospital Admission (non-emergent condition) []  - 0 New Admissions / Biomedical engineer / Ordering NPWT, Apligraf, etc. []  - 0 Emergency Hospital Admission (emergent condition) PROCESS - Special Needs []  - Pediatric / Minor Patient Management 0 []  - 0 Isolation Patient Management []  - 0 Hearing / Language / Visual special needs []  - 0 Assessment of Community assistance (transportation, D/C planning, etc.) []  - 0 Additional assistance / Altered mentation []  - 0 Support Surface(s) Assessment (bed, cushion, seat, etc.) INTERVENTIONS - Miscellaneous []  - External ear exam 0 []  - 0 Patient Transfer (multiple staff / Civil Service fast streamer / Similar devices) []  - 0 Simple Staple / Suture removal (25 or less) []  - 0 Complex Staple / Suture removal (26 or more) []  - 0 Hypo/Hyperglycemic Management (do not check if billed separately) []  - 0 Ankle / Brachial Index (ABI) - do not check if billed separately Has the patient been seen at the hospital within the last three years: Yes Total Score: 0 Level Of Care: ____ [name] ([id number]) Electronic  Signature(s) Signed: 05/25/2021 3:56:38 PM By: Donnamarie Poag Entered By: Donnamarie Poag on 05/25/2021 15:18:59 [name] ([id number]) -------------------------------------------------------------------------------- Compression  Therapy Details Patient Name: [name], [name]. Date of Service: 05/25/2021 2:30 PM Medical Record Number: [id number] Patient Account Number: [id number] Date of Birth/Sex: [birth year] (70 y.o. F) Treating RN: Donnamarie Poag Primary Care Favor Hackler: Rutherford Guys Other Clinician: Referring Eleni Frank: Rutherford Guys Treating Autry Prust/Extender: Yaakov Guthrie in Treatment: 8 Compression Therapy Performed for Wound Assessment: Wound #5 Right,Medial Lower Leg Performed By: Clinician Donnamarie Poag, RN Compression Type: Three Layer Post Procedure Diagnosis Same as Pre-procedure Electronic Signature(s) Signed: 05/25/2021 3:56:38 PM By: Donnamarie Poag Entered By: Donnamarie Poag on 05/25/2021 15:18:29 [name] ([id number]) -------------------------------------------------------------------------------- Encounter Discharge Information Details Patient Name: [name]. Date of Service: 05/25/2021 2:30 PM Medical Record Number: [id number] Patient Account Number: [id number] Date of Birth/Sex: [birth year] (70 y.o. F) Treating RN: Donnamarie Poag Primary Care Clarence Dunsmore: Rutherford Guys Other Clinician: Referring Estellar Cadena: Rutherford Guys Treating Zhyon Antenucci/Extender: Yaakov Guthrie in Treatment: 8 Encounter Discharge Information Items Post Procedure Vitals Discharge Condition: Stable Temperature (F): 98.3 Ambulatory Status: Walker Pulse (bpm): 97 Discharge Destination: Home Respiratory Rate (breaths/min): 18 Transportation: Private Auto Blood Pressure (mmHg): 140/72 Accompanied By: self Schedule Follow-up Appointment: Yes Clinical Summary of Care: Electronic Signature(s) Signed: 05/25/2021 3:56:38 PM By: Donnamarie Poag Entered By: Donnamarie Poag on 05/25/2021  15:20:14 [name] ([id number]) -------------------------------------------------------------------------------- Lower Extremity Assessment Details Patient Name: [name]. Date of Service: 05/25/2021 2:30 PM Medical Record Number: [id number] Patient Account Number: [id number] Date of Birth/Sex: 1950-12-04 (70 y.o. F) Treating RN: Donnamarie Poag Primary Care Delma Drone: Rutherford Guys Other Clinician: Referring Chelcie Estorga: Rutherford Guys Treating Aldin Drees/Extender: Yaakov Guthrie in Treatment: 8 Edema Assessment Assessed: [Left: Yes] [Right: Yes] Edema: [Left: Yes] [Right: Yes] Calf Left: Right: Point of Measurement: 29 cm From Medial Instep 47.5 cm 51 cm Ankle Left: Right: Point of Measurement: 10 cm From Medial Instep 22 cm 22 cm Vascular Assessment Pulses: Dorsalis Pedis Palpable: [Left:Yes] [Right:Yes] Notes observed bilateral compression wraps slid down below calf-stated happened last night Electronic Signature(s) Signed: 05/25/2021 2:54:54 PM By: Donnamarie Poag Entered By: Donnamarie Poag on 05/25/2021 14:35:17 [name] ([id number]) -------------------------------------------------------------------------------- Multi Wound Chart Details Patient Name: [name]. Date of Service: 05/25/2021 2:30 PM Medical Record Number: [id number] Patient Account Number: [id number] Date of Birth/Sex: 09-Apr-1951 (70 y.o. 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 Comorbid History: Cataracts, Arrhythmia, Cataracts, Arrhythmia, N/A Hypertension, Type II Diabetes, Hypertension, Type II Diabetes, Osteoarthritis, Neuropathy Osteoarthritis, Neuropathy Date Acquired: 02/09/2021 04/18/2021 N/A Weeks of Treatment: 8 5 N/A Wound Status: Open Open N/A Clustered Wound: No Yes N/A Measurements L x W x D (cm) 0.2x0.2x0.1 0.4x0.5x0.1 N/A Area (cm) : 0.031 0.157 N/A Volume (cm) : 0.003 0.016 N/A % Reduction in Area: 67.00% 77.80% N/A % Reduction in Volume: 66.70% 88.70% N/A Classification: Grade 1 Full Thickness Without Exposed N/A Support Structures Exudate Amount: Medium Large N/A Exudate Type: Serosanguineous Serous N/A Exudate Color: red, brown amber N/A Granulation Amount: Large (67-100%) Large (67-100%) N/A Granulation Quality: Red, Pink Red, Pink N/A Necrotic Amount: Small (1-33%) Small (1-33%) N/A Exposed Structures: Fascia: No Fat Layer (Subcutaneous Tissue): N/A Fat Layer (Subcutaneous Tissue): Yes No Fascia: No Tendon:  No Tendon: No Muscle: No Muscle: No Joint: No Joint: No Bone: No Bone: No Epithelialization: None None N/A Debridement: Debridement - Excisional N/A N/A Pre-procedure Verification/Time 15:14 N/A N/A Out Taken: Pain Control: Lidocaine N/A N/A Tissue Debrided: Subcutaneous N/A N/A Level: Skin/Subcutaneous Tissue N/A N/A Debridement Area (sq cm): 0.04 N/A N/A Instrument: Curette N/A N/A Bleeding: None N/A N/A Debridement Treatment Procedure was tolerated well N/A N/A ResponseMAYTTE, [name] ([id number]) Post Debridement 0.2x0.2x0.1 N/A N/A Measurements L x W x D (cm) Post Debridement Volume: 0.003 N/A N/A (cm) Procedures Performed: Debridement Compression Therapy N/A Treatment Notes Wound #3 (Toe Great) Wound Laterality: Left Cleanser Normal Saline Discharge Instruction: Wash your hands with soap and water. Remove old dressing, discard into plastic bag and place into trash. Cleanse the  wound with Normal Saline prior to applying a clean dressing using gauze sponges, not tissues or cotton balls. Do not scrub or use excessive force. Pat dry using gauze sponges, not tissue or cotton balls. Soap and Water Discharge Instruction: Gently cleanse wound with antibacterial soap, rinse and pat dry prior to dressing wounds Peri-Wound Care Topical Santyl Collagenase Ointment, 30 (gm), tube Discharge Instruction: IN OFFICE ONLY--apply nickel thick to wound bed only Primary Dressing Hydrofera Blue Ready Transfer Foam, 2.5x2.5 (in/in) Discharge Instruction: Apply Hydrofera Blue Ready to wound bed as directed Secondary Dressing Gauze Secured With 43M Medipore H Soft Cloth Surgical Tape, 2x2 (in/yd) Compression Wrap Compression Stockings Add-Ons Wound #5 (Lower Leg) Wound Laterality: Right, Medial Cleanser Wound Cleanser Discharge Instruction: Wash your hands with soap and water. Remove old dressing, discard into plastic bag and place into trash. Cleanse the wound with Wound Cleanser prior to applying a clean dressing using gauze sponges, not tissues or cotton balls. Do not scrub or use excessive force. Pat dry using gauze sponges, not tissue or cotton balls. Peri-Wound Care Topical Primary Dressing Silvercel Small 2x2 (in/in) Discharge Instruction: Apply Silvercel Small 2x2 (in/in) as instructed Secondary Dressing ABD Pad 5x9 (in/in) Discharge Instruction: Cover with ABD pad Secured With Compression Wrap Profore Lite LF 3 Multilayer Compression Bandaging System Discharge Instruction: Apply 3 multi-layer wrap as prescribed. Compression Stockings [name], [name] ([id number]) Add-Ons Electronic Signature(s) Signed: 05/25/2021 3:34:48 PM By: Kalman Shan DO Entered By: Kalman Shan on 05/25/2021 15:29:27 [name] ([id number]) -------------------------------------------------------------------------------- [place] Details Patient Name:  [name], [name]. Date of Service: 05/25/2021 2:30 PM Medical Record Number: [id number] Patient Account Number: [id number] Date of Birth/Sex: [birth year] (70 y.o. F) Treating RN: Donnamarie Poag Primary Care Sunjai Levandoski: Rutherford Guys Other Clinician: Referring Jerriyah Louis: Rutherford Guys Treating Nafisah Runions/Extender: Yaakov Guthrie in Treatment: 8 Active Inactive Wound/Skin Impairment Nursing Diagnoses: Impaired tissue integrity Goals: Patient/caregiver will verbalize understanding of skin care regimen Date Initiated: 03/30/2021 Date Inactivated: 04/27/2021 Target Resolution Date: 03/30/2021 Goal Status: Met Ulcer/skin breakdown will have a volume reduction of 30% by week 4 Date Initiated: 03/30/2021 Date Inactivated: 05/11/2021 Target Resolution Date: 04/30/2021 Goal Status: Met Ulcer/skin breakdown will have a volume reduction of 50% by week 8 Date Initiated: 03/30/2021 Date Inactivated: 05/25/2021 Target Resolution Date: 05/30/2021 Goal Status: Met Ulcer/skin breakdown will have a volume reduction of 80% by week 12 Date Initiated: 03/30/2021 Target Resolution Date: 06/30/2021 Goal Status: Active Ulcer/skin breakdown will heal within 14 weeks Date Initiated: 03/30/2021 Target Resolution Date: 07/31/2021 Goal Status: Active Interventions: Assess patient/caregiver ability to obtain necessary supplies Assess patient/caregiver ability to perform ulcer/skin care regimen upon admission and as needed Assess ulceration(s) every visit Treatment Activities: Patient referred to home  care : 03/30/2021 Referred to DME Yanelly Cantrelle for dressing supplies : 03/30/2021 Skin care regimen initiated : 03/30/2021 Notes: Electronic Signature(s) Signed: 05/25/2021 3:56:38 PM By: Donnamarie Poag Entered By: Donnamarie Poag on 05/25/2021 15:13:14 [name] ([id number]) -------------------------------------------------------------------------------- Non-Wound Condition Assessment Details Patient Name: [name]. Date of Service: 05/25/2021 2:30 PM Medical Record Number: [id number] Patient Account Number: [id number] Date of Birth/Sex: Sep 15, 1950 (70 y.o. F) Treating RN: Donnamarie Poag Primary Care Maybell Misenheimer: Rutherford Guys Other Clinician: Referring Aida Lemaire: Rutherford Guys Treating Larrissa Stivers/Extender: Yaakov Guthrie in Treatment: 8 Non-Wound Condition: Condition: Lymphedema Location: Leg Side: Bilateral Photos Electronic Signature(s) Signed: 05/25/2021 2:54:54 PM By: Donnamarie Poag Entered By: Donnamarie Poag on 05/25/2021 14:33:28 [name] ([id number]) -------------------------------------------------------------------------------- Pain Assessment Details Patient Name: [name]. Date of Service: 05/25/2021 2:30 PM Medical Record Number: [id number] Patient Account Number: [id number] Date of Birth/Sex: 03-30-1951 (70 y.o. F) Treating RN: Donnamarie Poag Primary Care Jacori Mulrooney: Rutherford Guys Other Clinician: Referring Catrell Morrone: Rutherford Guys Treating Rhayne Chatwin/Extender: Yaakov Guthrie in Treatment: 8 Active Problems Location of Pain Severity and Description of Pain Patient Has Paino No Site Locations Rate the pain. Current Pain Level: 0 Pain Management and Medication Current Pain Management: Electronic Signature(s) Signed: 05/25/2021 2:54:54 PM By: Donnamarie Poag Entered By: Donnamarie Poag on 05/25/2021 14:29:23 [name] ([id number]) -------------------------------------------------------------------------------- Patient/Caregiver Education Details Patient Name: [name]. Date of Service: 05/25/2021 2:30 PM Medical Record Number: [id number] Patient Account Number: [id number] Date of Birth/Gender: 1951-01-28 (70 y.o. F) Treating RN: Donnamarie Poag Primary Care Physician: Rutherford Guys Other Clinician: Referring Physician: Rutherford Guys Treating Physician/Extender: Yaakov Guthrie in Treatment: 8 Education Assessment Education Provided To: Patient Education  Topics Provided Basic Hygiene: Wound Debridement: Wound/Skin Impairment: Electronic Signature(s) Signed: 05/25/2021 3:56:38 PM By: Donnamarie Poag Entered By: Donnamarie Poag on 05/25/2021 15:19:19 [name] ([id number]) -------------------------------------------------------------------------------- Wound Assessment Details Patient Name: [name]. Date of Service: 05/25/2021 2:30 PM Medical Record Number: [id number] Patient Account Number: [id number] Date of Birth/Sex: 09/13/1950 (70 y.o. F) Treating RN: Donnamarie Poag Primary Care Anelia Carriveau: Rutherford Guys Other Clinician: Referring Lucretia Pendley: Rutherford Guys Treating Carollynn Pennywell/Extender: Yaakov Guthrie in Treatment: 8 Wound Status Wound Number: 3 Primary Diabetic Wound/Ulcer of the Lower Extremity Etiology: Wound Location: Left Toe Great Wound Status: Open Wounding Event: Gradually Appeared Comorbid Cataracts, Arrhythmia, Hypertension, Type II Diabetes, Date Acquired: 02/09/2021 History: Osteoarthritis, Neuropathy Weeks Of Treatment: 8 Clustered Wound: No Photos Wound Measurements Length: (cm) 0.2 Width: (cm) 0.2 Depth: (cm) 0.1 Area: (cm) 0.031 Volume: (cm) 0.003 % Reduction in Area: 67% % Reduction in Volume: 66.7% Epithelialization: None Tunneling: No Undermining: No Wound Description Classification: Grade 1 Exudate Amount: Medium Exudate Type: Serosanguineous Exudate Color: red, brown Foul Odor After Cleansing: No Slough/Fibrino Yes Wound Bed Granulation Amount: Large (67-100%) Exposed Structure Granulation Quality: Red, Pink Fascia Exposed: No Necrotic Amount: Small (1-33%) Fat Layer (Subcutaneous Tissue) Exposed: No Necrotic Quality: Adherent Slough Tendon Exposed: No Muscle Exposed: No Joint Exposed: No Bone Exposed: No Treatment Notes Wound #3 (Toe Great) Wound Laterality: Left Cleanser Normal Saline Discharge Instruction: Wash your hands with soap and water. Remove old dressing, discard  into plastic bag and place into trash. Cleanse the wound with Normal Saline prior to applying a clean dressing using gauze sponges, not tissues or cotton balls. Do not scrub or use excessive force. Pat dry using gauze sponges, not tissue or cotton balls. Soap and [address] [place], [place] ([id number]) Discharge Instruction: Gently cleanse wound with antibacterial soap, rinse and pat dry prior  to dressing wounds Peri-Wound Care Topical Santyl Collagenase Ointment, 30 (gm), tube Discharge Instruction: IN OFFICE ONLY--apply nickel thick to wound bed only Primary Dressing Hydrofera Blue Ready Transfer Foam, 2.5x2.5 (in/in) Discharge Instruction: Apply Hydrofera Blue Ready to wound bed as directed Secondary Dressing Gauze Secured With 61M Medipore H Soft Cloth Surgical Tape, 2x2 (in/yd) Compression Wrap Compression Stockings Add-Ons Electronic Signature(s) Signed: 05/25/2021 2:54:54 PM By: Donnamarie Poag Entered By: Donnamarie Poag on 05/25/2021 14:31:53 [name] ([id number]) -------------------------------------------------------------------------------- Wound Assessment Details Patient Name: [name]. Date of Service: 05/25/2021 2:30 PM Medical Record Number: [id number] Patient Account Number: [id number] Date of Birth/Sex: [birth year] (70 y.o. F) Treating RN: Donnamarie Poag Primary Care Maritssa Haughton: Rutherford Guys Other Clinician: Referring Alberto Pina: Rutherford Guys Treating Sherelle Castelli/Extender: Yaakov Guthrie in Treatment: 8 Wound Status Wound Number: 5 Primary Venous Leg Ulcer Etiology: Wound Location: Right, Medial Lower Leg Wound Status: Open Wounding Event: Gradually Appeared Comorbid Cataracts, Arrhythmia, Hypertension, Type II Diabetes, Date Acquired: 04/18/2021 History: Osteoarthritis, Neuropathy Weeks Of Treatment: 5 Clustered Wound: Yes Photos Wound Measurements Length: (cm) 0.4 Width: (cm) 0.5 Depth: (cm) 0.1 Area: (cm) 0.157 Volume: (cm) 0.016 % Reduction in  Area: 77.8% % Reduction in Volume: 88.7% Epithelialization: None Tunneling: No Undermining: No Wound Description Classification: Full Thickness Without Exposed Support Structures Exudate Amount: Large Exudate Type: Serous Exudate Color: amber Foul Odor After Cleansing: No Slough/Fibrino Yes Wound Bed Granulation Amount: Large (67-100%) Exposed Structure Granulation Quality: Red, Pink Fascia Exposed: No Necrotic Amount: Small (1-33%) Fat Layer (Subcutaneous Tissue) Exposed: Yes Necrotic Quality: Adherent Slough Tendon Exposed: No Muscle Exposed: No Joint Exposed: No Bone Exposed: No Treatment Notes Wound #5 (Lower Leg) Wound Laterality: Right, Medial Cleanser Wound Cleanser Discharge Instruction: Wash your hands with soap and water. Remove old dressing, discard into plastic bag and place into trash. Cleanse the wound with Wound Cleanser prior to applying a clean dressing using gauze sponges, not tissues or cotton balls. Do not scrub or use excessive force. Pat dry using gauze sponges, not tissue or cotton balls. JANESA, [name] ([id number]) Peri-Wound Care Topical Primary Dressing Silvercel Small 2x2 (in/in) Discharge Instruction: Apply Silvercel Small 2x2 (in/in) as instructed Secondary Dressing ABD Pad 5x9 (in/in) Discharge Instruction: Cover with ABD pad Secured With Compression Wrap Profore Lite LF 3 Multilayer Compression Bandaging System Discharge Instruction: Apply 3 multi-layer wrap as prescribed. Compression Stockings Add-Ons Electronic Signature(s) Signed: 05/25/2021 2:54:54 PM By: Donnamarie Poag Entered By: Donnamarie Poag on 05/25/2021 14:32:48 [name] ([id number]) -------------------------------------------------------------------------------- [place] Details Patient Name: [name]. Date of Service: 05/25/2021 2:30 PM Medical Record Number: [id number] Patient Account Number: [id number] Date of Birth/Sex: [birth year] (70 y.o. F) Treating RN:  Donnamarie Poag Primary Care Jaidin Richison: Rutherford Guys Other Clinician: Referring Lynee Rosenbach: Rutherford Guys Treating Kaycee Mcgaugh/Extender: Yaakov Guthrie in Treatment: 8 Vital Signs Time Taken: 14:24 Temperature (F): 98.3 Height (in): 66 Pulse (bpm): 97 Weight (lbs): 332 Respiratory Rate (breaths/min): 18 Body Mass Index (BMI): 53.6 Blood Pressure (mmHg): 140/72 Reference Range: 80 - 120 mg / dl Electronic Signature(s) Signed: 05/25/2021 2:54:54 PM By: Donnamarie Poag Entered ByDonnamarie Poag on 05/25/2021 14:24:59

## 2021-05-25 NOTE — Progress Notes (Signed)
[name], [name] ([id number]) Visit Report for 05/25/2021 Chief Complaint Document Details Patient Name: [name], [name]. Date of Service: 05/25/2021 2:30 PM Medical Record Number: [id number] Patient Account Number: [id number] Date of Birth/Sex: [birth year] (70 y.o. F) Treating RN: Donnamarie Poag Primary Care Provider: Rutherford Guys Other Clinician: Referring Provider: Rutherford Guys Treating Provider/Extender: Yaakov Guthrie in Treatment: 8 Information Obtained from: Patient Chief Complaint Bilateral lower extremity wounds Electronic Signature(s) Signed: 05/25/2021 3:34:48 PM By: Kalman Shan DO Entered By: Kalman Shan on 05/25/2021 15:29:37 [name] ([id number]) -------------------------------------------------------------------------------- Debridement Details Patient Name: [name]. Date of Service: 05/25/2021 2:30 PM Medical Record Number: [id number] Patient Account Number: [id number] Date of Birth/Sex: 04-29-1951 (70 y.o. F) Treating RN: Donnamarie Poag Primary Care Provider: Rutherford Guys Other Clinician: Referring Provider: Rutherford Guys Treating Provider/Extender: Yaakov Guthrie in Treatment: 8 Debridement Performed for Wound #3 Left Toe Great Assessment: Performed By: Physician Kalman Shan, MD Debridement Type: Debridement Severity of Tissue Pre Debridement: Fat layer exposed Level of Consciousness (Pre- Awake and Alert procedure): Pre-procedure Verification/Time Out Yes - 15:14 Taken: Start Time: 15:15 Pain Control: Lidocaine Total Area Debrided (L x W): 0.2 (cm) x 0.2 (cm) = 0.04 (cm) Tissue and other material Viable, Non-Viable, Subcutaneous debrided: Level: Skin/Subcutaneous Tissue Debridement Description: Excisional Instrument: Curette Bleeding: None Response to Treatment: Procedure was tolerated well Level of Consciousness (Post- Awake and Alert procedure): Post Debridement Measurements of Total Wound Length: (cm)  0.2 Width: (cm) 0.2 Depth: (cm) 0.1 Volume: (cm) 0.003 Character of Wound/Ulcer Post Debridement: Improved Severity of Tissue Post Debridement: Fat layer exposed Post Procedure Diagnosis Same as Pre-procedure Electronic Signature(s) Signed: 05/25/2021 3:34:48 PM By: Kalman Shan DO Signed: 05/25/2021 3:56:38 PM By: Donnamarie Poag Entered By: Donnamarie Poag on 05/25/2021 15:18:09 [name] ([id number]) -------------------------------------------------------------------------------- HPI Details Patient Name: [name]. Date of Service: 05/25/2021 2:30 PM Medical Record Number: [id number] Patient Account Number: [id number] Date of Birth/Sex: April 11, 1951 (70 y.o. F) Treating RN: Donnamarie Poag Primary Care Provider: Rutherford Guys Other Clinician: Referring Provider: Rutherford Guys Treating Provider/Extender: Yaakov Guthrie in Treatment: 8 History of Present Illness HPI Description: Admission 10/5 Ms. Jazmyn Offner is a 70 year old female with a past medical history of hypertension, venous insufficiency, chronic diastolic heart failure and insulin-dependent type 2 diabetes that presents To the clinic for bilateral lower extremity wounds. She states that her right lower extremity developed a blister 1 week ago and she has been keeping the area covered with a bandaid. She states that when the Band-Aid came off it tore her skin. She subsequently developed pain, increased warmth and erythema to the right lower extremity. She denies purulent drainage. She has also developed an open wound to the left lower extremity that recently developed. She also has a small open wound to the proximal portion of the left great toe. This has been present since August and was evaluated by podiatry. It has not healed and she has not followed up with podiatry for this issue. 10/12; patient presents for follow-up. She states she has 2 days left on her antibiotics. She is tolerated this well. She reports  improvement to her right lower extremity symptoms. She denies signs of infection. She has home health. 10/26; patient presents for follow-up. She has no issues or complaints today. The compression wraps slid down to her shin. These were placed on Monday by home health. She has a couple of new open wounds due to this. She denies signs of infection. 11/2; patient presents for follow-up. The compression  wraps had slid down to her shin again. The wraps taken off today do not appear to be appropriate material for 3 layer compression wrap. She currently denies signs of infection. 11/9; patient presents for follow-up. She was able to increase her diuretic over the past week. Again home health does not have appropriate materials for 3 layer compression. She currently denies signs of infection. 11/16; patient presents for follow-up. She has no issues or complaints today. She currently denies signs of infection. 11/30; patient presents for follow-up. The wrap continues to have a hard time staying in place. Overall she is doing well with no issues or complaints today. Electronic Signature(s) Signed: 05/25/2021 3:34:48 PM By: Kalman Shan DO Entered By: Kalman Shan on 05/25/2021 15:30:50 [name] ([id number]) -------------------------------------------------------------------------------- Physical Exam Details Patient Name: [name]. Date of Service: 05/25/2021 2:30 PM Medical Record Number: [id number] Patient Account Number: [id number] Date of Birth/Sex: Feb 06, 1951 (70 y.o. F) Treating RN: Donnamarie Poag Primary Care Provider: Rutherford Guys Other Clinician: Referring Provider: Rutherford Guys Treating Provider/Extender: Yaakov Guthrie in Treatment: 8 Constitutional . Cardiovascular . Psychiatric . Notes Scattered open wounds limited to skin breakdown to the right lower extremity. Weeping noted to this leg. To the left great toe there is a small open wound with Nonviable  tissue. No signs of infection. Electronic Signature(s) Signed: 05/25/2021 3:34:48 PM By: Kalman Shan DO Entered By: Kalman Shan on 05/25/2021 15:31:53 [name] ([id number]) -------------------------------------------------------------------------------- Physician Orders Details Patient Name: [name]. Date of Service: 05/25/2021 2:30 PM Medical Record Number: [id number] Patient Account Number: [id number] Date of Birth/Sex: 1950/07/08 (70 y.o. F) Treating RN: Donnamarie Poag Primary Care Provider: Rutherford Guys Other Clinician: Referring Provider: Rutherford Guys Treating Provider/Extender: Yaakov Guthrie in Treatment: 8 Verbal / Phone Orders: No Diagnosis Coding Follow-up Appointments o Return Appointment in 1 week. o Nurse Visit as needed [place] for wound care. May utilize formulary equivalent dressing for wound treatment orders unless otherwise specified. Home Health Nurse may visit PRN to address patientos wound care needs. o Scheduled days for dressing changes to be completed; exception, patient has scheduled wound care visit that day. o **Please direct any NON-WOUND related issues/requests for orders to patient's Primary Care Physician. **If current dressing causes regression in wound condition, may D/C ordered dressing product/s and apply Normal Saline Moist Dressing daily until next [place] or Other MD appointment. **Notify W[facility] Healing Center of regression in wound condition at [place] Bathing/ Shower/ Hygiene o May shower with wound dressing protected with water repellent cover or cast protector. o No tub bath. Anesthetic (Use 'Patient Medications' Section for Anesthetic Order Entry) o Lidocaine applied to wound bed Edema Control - Lymphedema / Segmental Compressive Device / Other o Optional: One layer of unna paste to top  of compression wrap (to act as an anchor). o 3 Layer Compression System for Lymphedema. - bilateral lower legs o Elevate leg(s) parallel to the floor when sitting. o DO YOUR BEST to sleep in the bed at night. DO NOT sleep in your recliner. Long hours of sitting in a recliner leads to swelling of the legs and/or potential wounds on your backside. Non-Wound Condition o Additional non-wound orders/instructions: - apply silver alginate to blister type area left lower leg and gauze Additional Orders / Instructions o Follow Nutritious Diet and Increase Protein Intake Wound Treatment Wound #3 - Toe Great Wound Laterality: Left Cleanser: Normal Saline  3 x Per Week/30 Days Discharge Instructions: Wash your hands with soap and water. Remove old dressing, discard into plastic bag and place into trash. Cleanse the wound with Normal Saline prior to applying a clean dressing using gauze sponges, not tissues or cotton balls. Do not scrub or use excessive force. Pat dry using gauze sponges, not tissue or cotton balls. Cleanser: Soap and Water 3 x Per Week/30 Days Discharge Instructions: Gently cleanse wound with antibacterial soap, rinse and pat dry prior to dressing wounds Topical: Santyl Collagenase Ointment, 30 (gm), tube 3 x Per Week/30 Days Discharge Instructions: IN OFFICE ONLY--apply nickel thick to wound bed only Primary Dressing: Hydrofera Blue Ready Transfer Foam, 2.5x2.5 (in/in) 3 x Per Week/30 Days Discharge Instructions: Apply Hydrofera Blue Ready to wound bed as directed Secondary Dressing: Gauze 3 x Per Week/30 Days Secured With: 52M Medipore H Soft Cloth Surgical Tape, 2x2 (in/yd) 3 x Per Week/30 Days Wound #5 - Lower Leg Wound Laterality: Right, Medial Cleanser: Wound Cleanser 3 x Per Week/30 Days [name], [name] ([id number]) Discharge Instructions: Wash your hands with soap and water. Remove old dressing, discard into plastic bag and place into trash. Cleanse the wound with  Wound Cleanser prior to applying a clean dressing using gauze sponges, not tissues or cotton balls. Do not scrub or use excessive force. Pat dry using gauze sponges, not tissue or cotton balls. Primary Dressing: Silvercel Small 2x2 (in/in) 3 x Per Week/30 Days Discharge Instructions: Apply Silvercel Small 2x2 (in/in) as instructed Secondary Dressing: ABD Pad 5x9 (in/in) 3 x Per Week/30 Days Discharge Instructions: Cover with ABD pad Compression Wrap: Profore Lite LF 3 Multilayer Compression Bandaging System (Generic) 3 x Per Week/30 Days Discharge Instructions: Apply 3 multi-layer wrap as prescribed. Electronic Signature(s) Signed: 05/25/2021 3:34:48 PM By: Kalman Shan DO Signed: 05/25/2021 3:56:38 PM By: Donnamarie Poag Entered By: Donnamarie Poag on 05/25/2021 15:17:06 [name] ([id number]) -------------------------------------------------------------------------------- Problem List Details Patient Name: [name], [name]. Date of Service: 05/25/2021 2:30 PM Medical Record Number: [id number] Patient Account Number: [id number] Date of Birth/Sex: [birth year] (70 y.o. F) Treating RN: Donnamarie Poag Primary Care Provider: Rutherford Guys Other Clinician: Referring Provider: Rutherford Guys Treating Provider/Extender: Yaakov Guthrie in Treatment: 8 Active Problems ICD-10 Encounter Code Description Active Date MDM Diagnosis I87.331 Chronic venous hypertension (idiopathic) with ulcer and inflammation of 03/30/2021 No Yes right lower extremity I87.332 Chronic venous hypertension (idiopathic) with ulcer and inflammation of 03/30/2021 No Yes left lower extremity S91.302D Unspecified open wound, left foot, subsequent encounter 03/30/2021 No Yes Inactive Problems Resolved Problems Electronic Signature(s) Signed: 05/25/2021 3:34:48 PM By: Kalman Shan DO Entered By: Kalman Shan on 05/25/2021 15:29:20 [name]  ([id number]) -------------------------------------------------------------------------------- Progress Note Details Patient Name: [name]. Date of Service: 05/25/2021 2:30 PM Medical Record Number: [id number] Patient Account Number: [id number] Date of Birth/Sex: 12-Apr-1951 (70 y.o. F) Treating RN: Donnamarie Poag Primary Care Provider: Rutherford Guys Other Clinician: Referring Provider: Rutherford Guys Treating Provider/Extender: Yaakov Guthrie in Treatment: 8 Subjective Chief Complaint Information obtained from Patient Bilateral lower extremity wounds History of Present Illness (HPI) Admission 10/5 Ms. Ammarie Matsuura is a 70 year old female with a past medical history of hypertension, venous insufficiency, chronic diastolic heart failure and insulin-dependent type 2 diabetes that presents To the clinic for bilateral lower extremity wounds. She states that her right lower extremity developed a blister 1 week ago and she has been keeping the area covered with a bandaid. She states that when the Band-Aid came off it tore her  skin. She subsequently developed pain, increased warmth and erythema to the right lower extremity. She denies purulent drainage. She has also developed an open wound to the left lower extremity that recently developed. She also has a small open wound to the proximal portion of the left great toe. This has been present since August and was evaluated by podiatry. It has not healed and she has not followed up with podiatry for this issue. 10/12; patient presents for follow-up. She states she has 2 days left on her antibiotics. She is tolerated this well. She reports improvement to her right lower extremity symptoms. She denies signs of infection. She has home health. 10/26; patient presents for follow-up. She has no issues or complaints today. The compression wraps slid down to her shin. These were placed on Monday by home health. She has a couple of new open wounds due  to this. She denies signs of infection. 11/2; patient presents for follow-up. The compression wraps had slid down to her shin again. The wraps taken off today do not appear to be appropriate material for 3 layer compression wrap. She currently denies signs of infection. 11/9; patient presents for follow-up. She was able to increase her diuretic over the past week. Again home health does not have appropriate materials for 3 layer compression. She currently denies signs of infection. 11/16; patient presents for follow-up. She has no issues or complaints today. She currently denies signs of infection. 11/30; patient presents for follow-up. The wrap continues to have a hard time staying in place. Overall she is doing well with no issues or complaints today. Patient History Information obtained from Patient. Family History Cancer - Maternal Grandparents,Paternal Grandparents,Mother,Father, Diabetes - Maternal Grandparents,Paternal Grandparents,Mother,Siblings, Heart Disease - Father,Siblings, Hypertension - Siblings,Father,Mother, No family history of Hereditary Spherocytosis, Kidney Disease, Lung Disease, Seizures, Stroke, Thyroid Problems, Tuberculosis. Social History Never smoker, Marital Status - Single, Alcohol Use - Never, Drug Use - No History, Caffeine Use - Never. Medical History Eyes Patient has history of Cataracts Denies history of Glaucoma, Optic Neuritis Ear/Nose/Mouth/Throat Denies history of Chronic sinus problems/congestion, Middle ear problems Hematologic/Lymphatic Denies history of Anemia, Hemophilia, Human Immunodeficiency Virus, Lymphedema, Sickle Cell Disease Respiratory Denies history of Aspiration, Asthma, Chronic Obstructive Pulmonary Disease (COPD), Pneumothorax, Sleep Apnea, Tuberculosis Cardiovascular Patient has history of Arrhythmia - Afib, Hypertension Denies history of Angina, Congestive Heart Failure, Coronary Artery Disease, Deep Vein Thrombosis, Hypotension,  Myocardial Infarction, Peripheral Arterial Disease, Peripheral Venous Disease, Phlebitis, Vasculitis Gastrointestinal Denies history of Cirrhosis , Colitis, Crohn s, Hepatitis A, Hepatitis B, Hepatitis C Endocrine Patient has history of Type II Diabetes Denies history of Type I Diabetes Genitourinary Denies history of End Stage Renal Disease Immunological [name], [name] ([id number]) Denies history of Lupus Erythematosus, Raynaud s, Scleroderma Integumentary (Skin) Denies history of History of Burn, History of pressure wounds Musculoskeletal Patient has history of Osteoarthritis Denies history of Gout, Rheumatoid Arthritis, Osteomyelitis Neurologic Patient has history of Neuropathy - fingers and feet Denies history of Dementia, Quadriplegia, Paraplegia, Seizure Disorder Oncologic Denies history of Received Chemotherapy, Received Radiation Psychiatric Denies history of Anorexia/bulimia, Confinement Anxiety Medical And Surgical History Notes Cardiovascular "heart valve problem" Objective Constitutional Vitals Time Taken: 2:24 PM, Height: 66 in, Weight: 332 lbs, BMI: 53.6, Temperature: 98.3 F, Pulse: 97 bpm, Respiratory Rate: 18 breaths/min, Blood Pressure: 140/72 mmHg. General Notes: Scattered open wounds limited to skin breakdown to the right lower extremity. Weeping noted to this leg. To the left great toe there is a small open wound with Nonviable  tissue. No signs of infection. Integumentary (Hair, Skin) Wound #3 status is Open. Original cause of wound was Gradually Appeared. The date acquired was: 02/09/2021. The wound has been in treatment 8 weeks. The wound is located on the Left Toe Great. The wound measures 0.2cm length x 0.2cm width x 0.1cm depth; 0.031cm^2 area and 0.003cm^3 volume. There is no tunneling or undermining noted. There is a medium amount of serosanguineous drainage noted. There is large (67-100%) red, pink granulation within the wound bed. There is a small  (1-33%) amount of necrotic tissue within the wound bed including Adherent Slough. Wound #5 status is Open. Original cause of wound was Gradually Appeared. The date acquired was: 04/18/2021. The wound has been in treatment 5 weeks. The wound is located on the Right,Medial Lower Leg. The wound measures 0.4cm length x 0.5cm width x 0.1cm depth; 0.157cm^2 area and 0.016cm^3 volume. There is Fat Layer (Subcutaneous Tissue) exposed. There is no tunneling or undermining noted. There is a large amount of serous drainage noted. There is large (67-100%) red, pink granulation within the wound bed. There is a small (1-33%) amount of necrotic tissue within the wound bed including Adherent Slough. Assessment Active Problems ICD-10 Chronic venous hypertension (idiopathic) with ulcer and inflammation of right lower extremity Chronic venous hypertension (idiopathic) with ulcer and inflammation of left lower extremity Unspecified open wound, left foot, subsequent encounter Patient's wounds have shown improvement in size and appearance since last clinic visit. I debrided nonviable tissue to the left great toe. I recommended continuing silver alginate to the right leg and would like to switch back to [facility] to the left toe wound. No signs of infection on exam. Procedures [name], [name] ([id number]) Wound #3 Pre-procedure diagnosis of Wound #3 is a Diabetic Wound/Ulcer of the Lower Extremity located on the Left Toe Great .Severity of Tissue Pre Debridement is: Fat layer exposed. There was a Excisional Skin/Subcutaneous Tissue Debridement with a total area of 0.04 sq cm performed by Kalman Shan, MD. With the following instrument(s): Curette to remove Viable and Non-Viable tissue/material. Material removed includes Subcutaneous Tissue after achieving pain control using Lidocaine. A time out was conducted at 15:14, prior to the start of the procedure. There was no bleeding. The procedure was tolerated  well. Post Debridement Measurements: 0.2cm length x 0.2cm width x 0.1cm depth; 0.003cm^3 volume. Character of Wound/Ulcer Post Debridement is improved. Severity of Tissue Post Debridement is: Fat layer exposed. Post procedure Diagnosis Wound #3: Same as Pre-Procedure Wound #5 Pre-procedure diagnosis of Wound #5 is a Venous Leg Ulcer located on the Right,Medial Lower Leg . There was a Three Layer Compression Therapy Procedure by Donnamarie Poag, RN. Post procedure Diagnosis Wound #5: Same as Pre-Procedure Plan Follow-up Appointments: Return Appointment in 1 week. Nurse Visit as needed Home Health: [place]: - [place] for wound care. May utilize formulary equivalent dressing for wound treatment orders unless otherwise specified. Home Health Nurse may visit PRN to address patient s wound care needs. Scheduled days for dressing changes to be completed; exception, patient has scheduled wound care visit that day. **Please direct any NON-WOUND related issues/requests for orders to patient's Primary Care Physician. **If current dressing causes regression in wound condition, may D/C ordered dressing product/s and apply Normal Saline Moist Dressing daily until next [place] or Other MD appointment. **Notify Wound Healing Center of regression in wound condition at [place] Bathing/ Shower/ Hygiene: May shower with wound dressing protected with water  repellent cover or cast protector. No tub bath. Anesthetic (Use 'Patient Medications' Section for Anesthetic Order Entry): Lidocaine applied to wound bed Edema Control - Lymphedema / Segmental Compressive Device / Other: Optional: One layer of unna paste to top of compression wrap (to act as an anchor). 3 Layer Compression System for Lymphedema. - bilateral lower legs Elevate leg(s) parallel to the floor when sitting. DO YOUR BEST to sleep in the bed at night. DO NOT sleep in your  recliner. Long hours of sitting in a recliner leads to swelling of the legs and/or potential wounds on your backside. Non-Wound Condition: Additional non-wound orders/instructions: - apply silver alginate to blister type area left lower leg and gauze Additional Orders / Instructions: Follow Nutritious Diet and Increase Protein Intake WOUND #3: - Toe Great Wound Laterality: Left Cleanser: Normal Saline 3 x Per Week/30 Days Discharge Instructions: Wash your hands with soap and water. Remove old dressing, discard into plastic bag and place into trash. Cleanse the wound with Normal Saline prior to applying a clean dressing using gauze sponges, not tissues or cotton balls. Do not scrub or use excessive force. Pat dry using gauze sponges, not tissue or cotton balls. Cleanser: Soap and Water 3 x Per Week/30 Days Discharge Instructions: Gently cleanse wound with antibacterial soap, rinse and pat dry prior to dressing wounds Topical: Santyl Collagenase Ointment, 30 (gm), tube 3 x Per Week/30 Days Discharge Instructions: IN OFFICE ONLY--apply nickel thick to wound bed only Primary Dressing: Hydrofera Blue Ready Transfer Foam, 2.5x2.5 (in/in) 3 x Per Week/30 Days Discharge Instructions: Apply Hydrofera Blue Ready to wound bed as directed Secondary Dressing: Gauze 3 x Per Week/30 Days Secured With: 72M Medipore H Soft Cloth Surgical Tape, 2x2 (in/yd) 3 x Per Week/30 Days WOUND #5: - Lower Leg Wound Laterality: Right, Medial Cleanser: Wound Cleanser 3 x Per Week/30 Days Discharge Instructions: Wash your hands with soap and water. Remove old dressing, discard into plastic bag and place into trash. Cleanse the wound with Wound Cleanser prior to applying a clean dressing using gauze sponges, not tissues or cotton balls. Do not scrub or use excessive force. Pat dry using gauze sponges, not tissue or cotton balls. Primary Dressing: Silvercel Small 2x2 (in/in) 3 x Per Week/30 Days Discharge Instructions:  Apply Silvercel Small 2x2 (in/in) as instructed Secondary Dressing: ABD Pad 5x9 (in/in) 3 x Per Week/30 Days Discharge Instructions: Cover with ABD pad Compression Wrap: Profore Lite LF 3 Multilayer Compression Bandaging System (Generic) 3 x Per Week/30 Days Discharge Instructions: Apply 3 multi-layer wrap as prescribed. [name], [name] ([id number]) 1. In office sharp debridement 2. Silver alginate and Hydrofera Blue 3. 3 layer compression 4. Follow-up in 1 week Electronic Signature(s) Signed: 05/25/2021 3:34:48 PM By: Kalman Shan DO Entered By: Kalman Shan on 05/25/2021 15:34:11 [name] ([id number]) -------------------------------------------------------------------------------- ROS/PFSH Details Patient Name: [name]. Date of Service: 05/25/2021 2:30 PM Medical Record Number: [id number] Patient Account Number: [id number] Date of Birth/Sex: 24-Oct-1950 (70 y.o. F) Treating RN: Donnamarie Poag Primary Care Provider: Rutherford Guys Other Clinician: Referring Provider: Rutherford Guys Treating Provider/Extender: Yaakov Guthrie in Treatment: 8 Information Obtained From Patient Eyes Medical History: Positive for: Cataracts Negative for: Glaucoma; Optic Neuritis Ear/Nose/Mouth/Throat Medical History: Negative for: Chronic sinus problems/congestion; Middle ear problems Hematologic/Lymphatic Medical History: Negative for: Anemia; Hemophilia; Human Immunodeficiency Virus; Lymphedema; Sickle Cell Disease Respiratory Medical History: Negative for: Aspiration; Asthma; Chronic Obstructive Pulmonary Disease (COPD); Pneumothorax; Sleep Apnea; Tuberculosis Cardiovascular Medical History: Positive for: Arrhythmia - Afib; Hypertension Negative  for: Angina; Congestive Heart Failure; Coronary Artery Disease; Deep Vein Thrombosis; Hypotension; Myocardial Infarction; Peripheral Arterial Disease; Peripheral Venous Disease; Phlebitis; Vasculitis Past Medical History  Notes: "heart valve problem" Gastrointestinal Medical History: Negative for: Cirrhosis ; Colitis; Crohnos; Hepatitis A; Hepatitis B; Hepatitis C Endocrine Medical History: Positive for: Type II Diabetes Negative for: Type I Diabetes Time with diabetes: since 2007 Treated with: Insulin, Oral agents Blood sugar tested every day: Yes Tested : daily Genitourinary Medical History: Negative for: End Stage Renal Disease Immunological Medical History: Negative for: Lupus Erythematosus; Raynaudos; Scleroderma Integumentary (Skin) [name], [name] ([id number]) Medical History: Negative for: History of Burn; History of pressure wounds Musculoskeletal Medical History: Positive for: Osteoarthritis Negative for: Gout; Rheumatoid Arthritis; Osteomyelitis Neurologic Medical History: Positive for: Neuropathy - fingers and feet Negative for: Dementia; Quadriplegia; Paraplegia; Seizure Disorder Oncologic Medical History: Negative for: Received Chemotherapy; Received Radiation Psychiatric Medical History: Negative for: Anorexia/bulimia; Confinement Anxiety HBO Extended History Items Eyes: Cataracts Immunizations Pneumococcal Vaccine: Received Pneumococcal Vaccination: Yes Received Pneumococcal Vaccination On or After 60th Birthday: No Implantable Devices Yes Family and Social History Cancer: Yes - Maternal Grandparents,Paternal Grandparents,Mother,Father; Diabetes: Yes - Maternal Grandparents,Paternal Grandparents,Mother,Siblings; Heart Disease: Yes - Father,Siblings; Hereditary Spherocytosis: No; Hypertension: Yes - Siblings,Father,Mother; Kidney Disease: No; Lung Disease: No; Seizures: No; Stroke: No; Thyroid Problems: No; Tuberculosis: No; Never smoker; Marital Status - Single; Alcohol Use: Never; Drug Use: No History; Caffeine Use: Never; Financial Concerns: Yes; Food, Clothing or Shelter Needs: No; Support System Lacking: No; Transportation Concerns: No Electronic  Signature(s) Signed: 05/25/2021 3:34:48 PM By: Kalman Shan DO Signed: 05/25/2021 3:56:38 PM By: Donnamarie Poag Entered By: Kalman Shan on 05/25/2021 15:30:57 [name] ([id number]) -------------------------------------------------------------------------------- [facility] Details Patient Name: [name]. Date of Service: 05/25/2021 Medical Record Number: [id number] Patient Account Number: [id number] Date of Birth/Sex: [birth year] (70 y.o. 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 (idiopathic) with ulcer and inflammation of right lower extremity I87.332 Chronic venous hypertension (idiopathic) with ulcer and inflammation of left lower extremity S91.302D Unspecified open wound, left foot, subsequent encounter Facility Procedures CPT4 Code Description: 68032122 11042 - DEB SUBQ TISSUE 20 SQ CM/< Modifier: Quantity: 1 CPT4 Code Description: ICD-10 Diagnosis Description I87.332 Chronic venous hypertension (idiopathic) with ulcer and inflammation of l Modifier: eft lower extremity Quantity: Physician Procedures CPT4 Code Description: 4825003 70488 - WC PHYS SUBQ TISS 20 SQ CM Modifier: Quantity: 1 CPT4 Code Description: ICD-10 Diagnosis Description I87.332 Chronic venous hypertension (idiopathic) with ulcer and inflammation of l Modifier: eft lower extremity Quantity: Electronic Signature(s) Signed: 05/25/2021 3:34:48 PM By: Kalman Shan DO Entered By: Kalman Shan on 05/25/2021 15:34:29

## 2021-06-01 ENCOUNTER — Other Ambulatory Visit: Payer: Self-pay

## 2021-06-01 ENCOUNTER — Encounter: Payer: Medicare Other | Attending: Internal Medicine | Admitting: Internal Medicine

## 2021-06-01 DIAGNOSIS — L97919 Non-pressure chronic ulcer of unspecified part of right lower leg with unspecified severity: Secondary | ICD-10-CM | POA: Insufficient documentation

## 2021-06-01 DIAGNOSIS — Z794 Long term (current) use of insulin: Secondary | ICD-10-CM | POA: Insufficient documentation

## 2021-06-01 DIAGNOSIS — E11621 Type 2 diabetes mellitus with foot ulcer: Secondary | ICD-10-CM | POA: Diagnosis not present

## 2021-06-01 DIAGNOSIS — I110 Hypertensive heart disease with heart failure: Secondary | ICD-10-CM | POA: Diagnosis not present

## 2021-06-01 DIAGNOSIS — I87331 Chronic venous hypertension (idiopathic) with ulcer and inflammation of right lower extremity: Secondary | ICD-10-CM | POA: Diagnosis not present

## 2021-06-01 DIAGNOSIS — L97929 Non-pressure chronic ulcer of unspecified part of left lower leg with unspecified severity: Secondary | ICD-10-CM | POA: Insufficient documentation

## 2021-06-01 DIAGNOSIS — I872 Venous insufficiency (chronic) (peripheral): Secondary | ICD-10-CM | POA: Insufficient documentation

## 2021-06-01 DIAGNOSIS — S91302A Unspecified open wound, left foot, initial encounter: Secondary | ICD-10-CM | POA: Insufficient documentation

## 2021-06-01 DIAGNOSIS — I5032 Chronic diastolic (congestive) heart failure: Secondary | ICD-10-CM | POA: Diagnosis not present

## 2021-06-01 DIAGNOSIS — I87332 Chronic venous hypertension (idiopathic) with ulcer and inflammation of left lower extremity: Secondary | ICD-10-CM

## 2021-06-01 DIAGNOSIS — S91302D Unspecified open wound, left foot, subsequent encounter: Secondary | ICD-10-CM | POA: Diagnosis not present

## 2021-06-01 DIAGNOSIS — I87333 Chronic venous hypertension (idiopathic) with ulcer and inflammation of bilateral lower extremity: Secondary | ICD-10-CM | POA: Diagnosis not present

## 2021-06-01 NOTE — Progress Notes (Signed)
[name], [name] ([id number]) Visit Report for 05/18/2021 Physician Orders Details Patient Name: [name], [name]. Date of Service: 05/18/2021 3:00 PM Medical Record Number: [id number] Patient Account Number: [id number] Date of Birth/Sex: 07-Feb-1951 (70 y.o. F) Treating RN: Carlene Coria Primary Care Provider: Rutherford Guys Other Clinician: Referring Provider: Rutherford Guys Treating Provider/Extender: Yaakov Guthrie in Treatment: 7 Verbal / Phone Orders: No Diagnosis Coding Follow-up Appointments o Return Appointment in 1 week. o Nurse Visit as needed [facility] for wound care. May utilize formulary equivalent dressing for wound treatment orders unless otherwise specified. Home Health Nurse may visit [facility] to address patientos wound care needs. o Scheduled days for dressing changes to be completed; exception, patient has scheduled wound care visit that day. o **Please direct any NON-WOUND related issues/requests for orders to patient's Primary Care Physician. **If current dressing causes regression in wound condition, may D/C ordered dressing product/s and apply Normal Saline Moist Dressing daily until next [place] or Other MD appointment. **Notify [facility] of regression in wound condition at [place] Bathing/ Shower/ Hygiene o May shower with wound dressing protected with water repellent cover or cast protector. o No tub bath. Edema Control - Lymphedema / Segmental Compressive Device / Other o Optional: One layer of unna paste to top of compression wrap (to act as an anchor). o 3 Layer Compression System for Lymphedema. - bilateral lower legs o Elevate leg(s) parallel to the floor when sitting. o DO YOUR BEST to sleep in the bed at night. DO NOT sleep in your recliner. Long hours of sitting in a recliner leads to swelling of the legs and/or  potential wounds on your backside. Non-Wound Condition o Additional non-wound orders/instructions: - apply silver alginate to blister type area left lower leg and gauze Additional Orders / Instructions o Follow Nutritious Diet and Increase Protein Intake Wound Treatment Wound #3 - Toe Great Wound Laterality: Left Primary Dressing: Silvercel Small 2x2 (in/in) 3 x Per Week/30 Days Discharge Instructions: Apply Silvercel Small 2x2 (in/in) as instructed Secondary Dressing: Gauze 3 x Per Week/30 Days Secured With: 21M Medipore H Soft Cloth Surgical Tape, 2x2 (in/yd) 3 x Per Week/30 Days Wound #5 - Lower Leg Wound Laterality: Right, Medial Cleanser: Wound Cleanser 3 x Per Week/30 Days Discharge Instructions: Wash your hands with soap and water. Remove old dressing, discard into plastic bag and place into trash. Cleanse the wound with Wound Cleanser prior to applying a clean dressing using gauze sponges, not tissues or cotton balls. Do not scrub or use excessive force. Pat dry using gauze sponges, not tissue or cotton balls. Primary Dressing: Silvercel Small 2x2 (in/in) 3 x Per Week/30 Days Discharge Instructions: Apply Silvercel Small 2x2 (in/in) as instructed Secondary Dressing: Xtrasorb Medium 4x5 (in/in) 3 x Per Week/30 Days Discharge Instructions: Apply to wound as directed. Do not cut. Compression Wrap: Profore Lite LF 3 Multilayer Compression Bandaging System (Generic) 3 x Per Week/30 Days [name], [name] ([id number]) Discharge Instructions: Apply 3 multi-layer wrap as prescribed. Electronic Signature(s) Signed: 05/25/2021 3:54:58 PM By: Kalman Shan DO Signed: 06/01/2021 1:44:34 PM By: Carlene Coria RN Entered By: Carlene Coria on 05/18/2021 15:18:15 [name] ([id number]) -------------------------------------------------------------------------------- [facility] Details Patient Name: [name]. Date of Service: 05/18/2021 Medical Record Number: [id number] Patient  Account Number: [id number] Date of Birth/Sex: 12-17-1950 (70 y.o. F) Treating RN: Carlene Coria Primary Care Provider: Rutherford Guys Other Clinician: Referring Provider: Rutherford Guys  Treating Provider/Extender: Yaakov Guthrie in Treatment: 7 Diagnosis Coding ICD-10 Codes Code Description I87.331 Chronic venous hypertension (idiopathic) with ulcer and inflammation of right lower extremity I87.332 Chronic venous hypertension (idiopathic) with ulcer and inflammation of left lower extremity S91.302D Unspecified open wound, left foot, subsequent encounter Facility Procedures CPT4: Description Modifier Quantity Code [id number] [id number] BILATERAL: Application of multi-layer venous compression system; leg (below knee), including 1 ankle and foot. Electronic Signature(s) Signed: 05/25/2021 3:54:58 PM By: Kalman Shan DO Signed: 06/01/2021 1:44:34 PM By: Carlene Coria RN Entered By: Carlene Coria on 05/18/2021 15:19:07

## 2021-06-01 NOTE — Progress Notes (Signed)
[name], [name] ([id number]) Visit Report for 05/18/2021 Arrival Information Details Patient Name: [name], [name]. Date of Service: 05/18/2021 3:00 PM Medical Record Number: [id number] Patient Account Number: [id number] Date of Birth/Sex: 09-03-1950 (70 y.o. F) Treating RN: Carlene Coria Primary Care Rosangelica Pevehouse: Rutherford Guys Other Clinician: Referring Sully Dyment: Rutherford Guys Treating Reginae Wolfrey/Extender: Yaakov Guthrie in Treatment: 7 Visit Information History Since Last Visit All ordered tests and consults were completed: No Patient Arrived: [name] Added or deleted any medications: No Arrival Time: 15:13 Any new allergies or adverse reactions: No Accompanied By: self Had a fall or experienced change in No Transfer Assistance: None activities of daily living that may affect Patient Identification Verified: Yes risk of falls: Secondary Verification Process Completed: Yes Signs or symptoms of abuse/neglect since last visito No Patient Requires Transmission-Based No Hospitalized since last visit: No Precautions: Implantable device outside of the clinic excluding No Patient Has Alerts: Yes cellular tissue based products placed in the center Patient Alerts: Patient on Blood since last visit: Thinner Has Dressing in Place as Prescribed: Yes DIABETIC Has Compression in Place as Prescribed: Yes ELIQUIS Pain Present Now: No Electronic Signature(s) Signed: 06/01/2021 1:44:34 PM By: Carlene Coria RN Entered By: Carlene Coria on 05/18/2021 15:14:14 [name] ([id number]) -------------------------------------------------------------------------------- Clinic Level of Care Assessment Details Patient Name: [name]. Date of Service: 05/18/2021 3:00 PM Medical Record Number: [id number] Patient Account Number: [id number] Date of Birth/Sex: 12/02/1950 (70 y.o. F) Treating RN: Carlene Coria Primary Care Danniel Grenz: Rutherford Guys Other Clinician: Referring Ninfa Giannelli: Rutherford Guys Treating Alyn Jurney/Extender: Yaakov Guthrie in Treatment: 7 Clinic Level of Care Assessment Items TOOL 1 Quantity Score []  - Use when EandM and Procedure is performed on INITIAL visit 0 ASSESSMENTS - Nursing Assessment / Reassessment []  - General Physical Exam (combine w/ comprehensive assessment (listed just below) when performed on new 0 pt. evals) []  - 0 Comprehensive Assessment (HX, ROS, Risk Assessments, Wounds Hx, etc.) ASSESSMENTS - Wound and Skin Assessment / Reassessment []  - Dermatologic / Skin Assessment (not related to wound area) 0 ASSESSMENTS - Ostomy and/or Continence Assessment and Care []  - Incontinence Assessment and Management 0 []  - 0 Ostomy Care Assessment and Management (repouching, etc.) PROCESS - Coordination of Care []  - Simple Patient / Family Education for ongoing care 0 []  - 0 Complex (extensive) Patient / Family Education for ongoing care []  - 0 Staff obtains Programmer, systems, Records, Test Results / Process Orders []  - 0 Staff telephones HHA, Nursing Homes / Clarify orders / etc []  - 0 Routine Transfer to another Facility (non-emergent condition) []  - 0 Routine Hospital Admission (non-emergent condition) []  - 0 New Admissions / Biomedical engineer / Ordering NPWT, Apligraf, etc. []  - 0 Emergency Hospital Admission (emergent condition) PROCESS - Special Needs []  - Pediatric / Minor Patient Management 0 []  - 0 Isolation Patient Management []  - 0 Hearing / Language / Visual special needs []  - 0 Assessment of Community assistance (transportation, D/C planning, etc.) []  - 0 Additional assistance / Altered mentation []  - 0 Support Surface(s) Assessment (bed, cushion, seat, etc.) INTERVENTIONS - Miscellaneous []  - External ear exam 0 []  - 0 Patient Transfer (multiple staff / Civil Service fast streamer / Similar devices) []  - 0 Simple Staple / Suture removal (25 or less) []  - 0 Complex Staple / Suture removal (26 or more) []  - 0 Hypo/Hyperglycemic  Management (do not check if billed separately) []  - 0 Ankle / Brachial Index (ABI) - do not check if billed separately Has the patient  been seen at the hospital within the last three years: Yes Total Score: 0 Level Of Care: ____ [name] ([id number]) Electronic Signature(s) Signed: 06/01/2021 1:44:34 PM By: Carlene Coria RN Entered By: Carlene Coria on 05/18/2021 15:18:45 [name] ([id number]) -------------------------------------------------------------------------------- Compression Therapy Details Patient Name: [name]. Date of Service: 05/18/2021 3:00 PM Medical Record Number: [id number] Patient Account Number: [id number] Date of Birth/Sex: [birth year] (70 y.o. F) Treating RN: Carlene Coria Primary Care Anglia Blakley: Rutherford Guys Other Clinician: Referring Torunn Chancellor: Rutherford Guys Treating Jabron Weese/Extender: Yaakov Guthrie in Treatment: 7 Compression Therapy Performed for Wound Assessment: Wound #5 Right,Medial Lower Leg Performed By: Clinician Carlene Coria, RN Compression Type: Three Layer Electronic Signature(s) Signed: 06/01/2021 1:44:34 PM By: Carlene Coria RN Entered By: Carlene Coria on 05/18/2021 15:16:38 [name] ([id number]) -------------------------------------------------------------------------------- Compression Therapy Details Patient Name: [name]. Date of Service: 05/18/2021 3:00 PM Medical Record Number: [id number] Patient Account Number: [id number] Date of Birth/Sex: 14-Mar-1951 (70 y.o. F) Treating RN: Carlene Coria Primary Care Javonne Louissaint: Rutherford Guys Other Clinician: Referring Axell Trigueros: Rutherford Guys Treating Alim Cattell/Extender: Yaakov Guthrie in Treatment: 7 Compression Therapy Performed for Wound Assessment: Non-Wound Location Performed By: Jake Church, RN Compression Type: Three Layer Location: Lower Extremity, Left Electronic Signature(s) Signed: 06/01/2021 1:44:34 PM By: Carlene Coria RN Entered By:  Carlene Coria on 05/18/2021 15:19:37 [name] ([id number]) -------------------------------------------------------------------------------- Encounter Discharge Information Details Patient Name: [name]. Date of Service: 05/18/2021 3:00 PM Medical Record Number: [id number] Patient Account Number: [id number] Date of Birth/Sex: Mar 20, 1951 (70 y.o. F) Treating RN: Carlene Coria Primary Care Geovana Gebel: Rutherford Guys Other Clinician: Referring Janel Beane: Rutherford Guys Treating Julianne Chamberlin/Extender: Yaakov Guthrie in Treatment: 7 Encounter Discharge Information Items Discharge Condition: Stable Ambulatory Status: Walker Discharge Destination: Home Transportation: Private Auto Accompanied By: self Schedule Follow-up Appointment: Yes Clinical Summary of Care: Patient Declined Electronic Signature(s) Signed: 06/01/2021 1:44:34 PM By: Carlene Coria RN Entered By: Carlene Coria on 05/18/2021 15:18:40 [name] ([id number]) -------------------------------------------------------------------------------- Wound Assessment Details Patient Name: [name]. Date of Service: 05/18/2021 3:00 PM Medical Record Number: [id number] Patient Account Number: [id number] Date of Birth/Sex: 10-Aug-1950 (70 y.o. F) Treating RN: Carlene Coria Primary Care Davin Archuletta: Rutherford Guys Other Clinician: Referring Asia Dusenbury: Rutherford Guys Treating Jaycee Pelzer/Extender: Yaakov Guthrie in Treatment: 7 Wound Status Wound Number: 3 Primary Diabetic Wound/Ulcer of the Lower Extremity Etiology: Wound Location: Left Toe Great Wound Open Wounding Event: Gradually Appeared Status: Date Acquired: 02/09/2021 Comorbid Cataracts, Arrhythmia, Hypertension, Type II Diabetes, Weeks Of Treatment: 7 History: Osteoarthritis, Neuropathy Clustered Wound: No Wound Measurements Length: (cm) 0.3 Width: (cm) 0.2 Depth: (cm) 0.1 Area: (cm) 0.047 Volume: (cm) 0.005 % Reduction in Area: 50% % Reduction in  Volume: 44.4% Epithelialization: None Tunneling: No Undermining: No Wound Description Classification: Grade 1 Exudate Amount: Medium Exudate Type: Serosanguineous Exudate Color: red, brown Foul Odor After Cleansing: No Slough/Fibrino Yes Wound Bed Granulation Amount: Medium (34-66%) Exposed Structure Granulation Quality: Red, Pink Fascia Exposed: No Necrotic Amount: Medium (34-66%) Fat Layer (Subcutaneous Tissue) Exposed: No Necrotic Quality: Adherent Slough Tendon Exposed: No Muscle Exposed: No Joint Exposed: No Bone Exposed: No Electronic Signature(s) Signed: 06/01/2021 1:44:34 PM By: Carlene Coria RN Entered By: Carlene Coria on 05/18/2021 15:15:51 [name] ([id number]) -------------------------------------------------------------------------------- Wound Assessment Details Patient Name: [name]. Date of Service: 05/18/2021 3:00 PM Medical Record Number: [id number] Patient Account Number: [id number] Date of Birth/Sex: 05/27/1951 (70 y.o. F) Treating RN: Carlene Coria Primary Care Millena Callins: Rutherford Guys Other Clinician: Referring Harim Bi: Rutherford Guys  Treating Harvie Morua/Extender: Yaakov Guthrie in Treatment: 7 Wound Status Wound Number: 5 Primary Venous Leg Ulcer Etiology: Wound Location: Right, Medial Lower Leg Wound Open Wounding Event: Gradually Appeared Status: Date Acquired: 04/18/2021 Comorbid Cataracts, Arrhythmia, Hypertension, Type II Diabetes, Weeks Of Treatment: 4 History: Osteoarthritis, Neuropathy Clustered Wound: Yes Wound Measurements Length: (cm) 2 Width: (cm) 2 Depth: (cm) 0.1 Area: (cm) 3.142 Volume: (cm) 0.314 % Reduction in Area: -344.4% % Reduction in Volume: -122.7% Epithelialization: None Tunneling: No Undermining: No Wound Description Classification: Full Thickness Without Exposed Support Structu Exudate Amount: Large Exudate Type: Serous Exudate Color: amber res Foul Odor After Cleansing:  No Slough/Fibrino Yes Wound Bed Granulation Amount: Large (67-100%) Exposed Structure Granulation Quality: Red, Pink Fascia Exposed: No Necrotic Amount: Small (1-33%) Fat Layer (Subcutaneous Tissue) Exposed: Yes Necrotic Quality: Adherent Slough Tendon Exposed: No Muscle Exposed: No Joint Exposed: No Bone Exposed: No Electronic Signature(s) Signed: 06/01/2021 1:44:34 PM By: Carlene Coria RN Entered By: Carlene Coria on 05/18/2021 15:16:21

## 2021-06-01 NOTE — Progress Notes (Signed)
[name], [name] ([id number]) Visit Report for 06/01/2021 Arrival Information Details Patient Name: [name], [name]. Date of Service: 06/01/2021 2:30 PM Medical Record Number: [id number] Patient Account Number: [id number] Date of Birth/Sex: [birth year] (70 y.o. F) Treating RN: Donnamarie Poag Primary Care Georgena Weisheit: Rutherford Guys Other Clinician: Referring Breyon Blass: Rutherford Guys Treating Petra Dumler/Extender: Yaakov Guthrie in Treatment: 9 Visit Information History Since Last Visit Added or deleted any medications: No Patient Arrived: [name] Had a fall or experienced change in No Arrival Time: 15:00 activities of daily living that may affect Accompanied By: self risk of falls: Transfer Assistance: None Hospitalized since last visit: No Patient Identification Verified: Yes Has Dressing in Place as Prescribed: Yes Secondary Verification Process Completed: Yes Has Compression in Place as Prescribed: Yes Patient Requires Transmission-Based No Pain Present Now: No Precautions: Patient Has Alerts: Yes Patient Alerts: Patient on Blood Thinner DIABETIC ELIQUIS Electronic Signature(s) Signed: 06/01/2021 4:15:55 PM By: Donnamarie Poag Entered By: Donnamarie Poag on 06/01/2021 15:02:04 [name] ([id number]) -------------------------------------------------------------------------------- Compression Therapy Details Patient Name: [name]. Date of Service: 06/01/2021 2:30 PM Medical Record Number: [id number] Patient Account Number: [id number] Date of Birth/Sex: Dec 29, 1950 (70 y.o. F) Treating RN: Donnamarie Poag Primary Care Noelly Lasseigne: Rutherford Guys Other Clinician: Referring Ravin Bendall: Rutherford Guys Treating Shahd Occhipinti/Extender: Yaakov Guthrie in Treatment: 9 Compression Therapy Performed for Wound Assessment: Wound #5 Right,Medial Lower Leg Performed By: Junius Argyle, RN Compression Type: Three Layer Post Procedure Diagnosis Same as Pre-procedure Electronic  Signature(s) Signed: 06/01/2021 4:15:55 PM By: Donnamarie Poag Entered By: Donnamarie Poag on 06/01/2021 15:18:20 [name] ([id number]) -------------------------------------------------------------------------------- Compression Therapy Details Patient Name: [name]. Date of Service: 06/01/2021 2:30 PM Medical Record Number: [id number] Patient Account Number: [id number] Date of Birth/Sex: 1951/03/16 (70 y.o. F) Treating RN: Donnamarie Poag Primary Care Orena Cavazos: Rutherford Guys Other Clinician: Referring Nykayla Marcelli: Rutherford Guys Treating Maruice Pieroni/Extender: Yaakov Guthrie in Treatment: 9 Compression Therapy Performed for Wound Assessment: Wound #3 Left Toe Great Performed By: Junius Argyle, RN Compression Type: Three Layer Post Procedure Diagnosis Same as Pre-procedure Electronic Signature(s) Signed: 06/01/2021 4:15:55 PM By: Donnamarie Poag Entered By: Donnamarie Poag on 06/01/2021 15:25:50 [name] ([id number]) -------------------------------------------------------------------------------- Encounter Discharge Information Details Patient Name: [name]. Date of Service: 06/01/2021 2:30 PM Medical Record Number: [id number] Patient Account Number: [id number] Date of Birth/Sex: 08-11-1950 (70 y.o. F) Treating RN: Donnamarie Poag Primary Care Herschel Fleagle: Rutherford Guys Other Clinician: Referring Caylei Sperry: Rutherford Guys Treating Gizzelle Lacomb/Extender: Yaakov Guthrie in Treatment: 9 Encounter Discharge Information Items Discharge Condition: Stable Ambulatory Status: Walker Discharge Destination: Home Transportation: Other Accompanied By: self Schedule Follow-up Appointment: Yes Clinical Summary of Care: Electronic Signature(s) Signed: 06/01/2021 4:15:55 PM By: Donnamarie Poag Entered By: Donnamarie Poag on 06/01/2021 15:27:09 [name] ([id number]) -------------------------------------------------------------------------------- Lower Extremity Assessment  Details Patient Name: [name]. Date of Service: 06/01/2021 2:30 PM Medical Record Number: [id number] Patient Account Number: [id number] Date of Birth/Sex: [birth year] (70 y.o. F) Treating RN: Donnamarie Poag Primary Care Mikiah Demond: Rutherford Guys Other Clinician: Referring Finneas Mathe: Rutherford Guys Treating Rubens Cranston/Extender: Yaakov Guthrie in Treatment: 9 Edema Assessment Assessed: [Left: Yes] [Right: Yes] Edema: [Left: Yes] [Right: Yes] Calf Left: Right: Point of Measurement: 29 cm From Medial Instep 46 cm 50.5 cm Ankle Left: Right: Point of Measurement: 10 cm From Medial Instep 21.4 cm 22.6 cm Vascular Assessment Pulses: Dorsalis Pedis Palpable: [Left:Yes] [Right:Yes] Notes observe bilateral compression wraps slid down below calf-stated wrapped yesterday by HH-education on calling [facility] or make nurse appt to rewrap Electronic  Signature(s) Signed: 06/01/2021 4:15:55 PM By: Donnamarie Poag Entered By: Donnamarie Poag on 06/01/2021 15:14:04 [name] ([id number]) -------------------------------------------------------------------------------- Multi Wound Chart Details Patient Name: [name]. Date of Service: 06/01/2021 2:30 PM Medical Record Number: [id number] Patient Account Number: [id number] Date of Birth/Sex: 25-Mar-1951 (70 y.o. 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 Comorbid History: Cataracts, Arrhythmia,  Cataracts, Arrhythmia, Cataracts, Arrhythmia, Hypertension, Type II Diabetes, Hypertension, Type II Diabetes, Hypertension, Type II Diabetes, Osteoarthritis, Neuropathy Osteoarthritis, Neuropathy Osteoarthritis, Neuropathy Date Acquired: 02/09/2021 04/18/2021 06/01/2021 Weeks of Treatment: 9 6 0 Wound Status: Open Open Open Clustered Wound: No Yes No Measurements L x W x D (cm) 0.2x0.2x0.1 1.7x1.5x0.1 2.2x1.5x0.1 Area (cm) : 0.031 2.003 2.592 Volume (cm) : 0.003 0.2 0.259 % Reduction in Area: 67.00% -183.30% N/A % Reduction in Volume: 66.70% -41.80% N/A Classification: Grade 1 Full Thickness Without Exposed Full Thickness Without Exposed Support Structures Support Structures Exudate Amount: Medium Large Medium Exudate Type: Serosanguineous Serous Serosanguineous Exudate Color: red, brown amber red, brown Granulation Amount: None Present (0%) Large (67-100%) Medium (34-66%) Granulation Quality: N/A Red, Pink Red, Pink Necrotic Amount: Large (67-100%) Small (1-33%) Medium (34-66%) Necrotic Tissue: Lanare Exposed Structures: Fascia: No Fat Layer (Subcutaneous Tissue): Fat Layer (Subcutaneous Tissue): Fat Layer (Subcutaneous Tissue): Yes Yes No Fascia: No Fascia: No Tendon: No Tendon: No Tendon: No Muscle: No Muscle: No Muscle: No Joint: No Joint: No Joint: No Bone: No Bone: No Bone: No Epithelialization: None None N/A Treatment Notes Electronic Signature(s) Signed: 06/01/2021 4:15:55 PM By: Donnamarie Poag Entered By: Donnamarie Poag on 06/01/2021 15:17:52 [name] ([id number]) [name] ([id number]) -------------------------------------------------------------------------------- Loda Details Patient Name: [name]. Date of Service: 06/01/2021 2:30 PM Medical Record Number: [id number] Patient Account Number: [id number] Date of Birth/Sex: April 09, 1951 (70 y.o. F) Treating RN: Donnamarie Poag Primary Care  Makhi Muzquiz: Rutherford Guys Other Clinician: Referring Anastasio Wogan: Rutherford Guys Treating Brayln Duque/Extender: Yaakov Guthrie in Treatment: 9 Active Inactive Wound/Skin Impairment Nursing Diagnoses: Impaired tissue integrity Goals: Patient/caregiver will verbalize understanding of skin care regimen Date Initiated: 03/30/2021 Date Inactivated: 04/27/2021 Target Resolution Date: 03/30/2021 Goal Status: Met Ulcer/skin breakdown will have a volume reduction of 30% by week 4 Date Initiated: 03/30/2021 Date Inactivated: 05/11/2021 Target Resolution Date: 04/30/2021 Goal Status: Met Ulcer/skin breakdown will have a volume reduction of 50% by week 8 Date Initiated: 03/30/2021 Date Inactivated: 05/25/2021 Target Resolution Date: 05/30/2021 Goal Status: Met Ulcer/skin breakdown will have a volume reduction of 80% by week 12 Date Initiated: 03/30/2021 Target Resolution Date: 06/30/2021 Goal Status: Active Ulcer/skin breakdown will heal within 14 weeks Date Initiated: 03/30/2021 Target Resolution Date: 07/31/2021 Goal Status: Active Interventions: Assess patient/caregiver ability to obtain necessary supplies Assess patient/caregiver ability to perform ulcer/skin care regimen upon admission and as needed Assess ulceration(s) every visit Treatment Activities: Patient referred to home care : 03/30/2021 Referred to DME Kayman Snuffer for dressing supplies : 03/30/2021 Skin care regimen initiated : 03/30/2021 Notes: Electronic Signature(s) Signed: 06/01/2021 4:15:55 PM By: Donnamarie Poag Entered By: Donnamarie Poag on 06/01/2021 15:15:04 [name] ([id number]) -------------------------------------------------------------------------------- Pain Assessment  Details Patient Name: ANJEL, PERFETTI. Date of Service: 06/01/2021 2:30 PM Medical Record Number: [id number] Patient Account Number: [id number] Date of Birth/Sex: 1951/06/21 (70 y.o. F) Treating RN: Donnamarie Poag Primary Care Lesean Woolverton: Rutherford Guys Other  Clinician: Referring Maicee Ullman: Rutherford Guys Treating Abdulahi Schor/Extender: Yaakov Guthrie in Treatment: 9 Active Problems Location of Pain Severity and Description of Pain Patient Has Paino No Site Locations Rate the pain. Current Pain Level: 0 Pain Management and Medication Current Pain Management: Electronic Signature(s) Signed: 06/01/2021 4:15:55 PM By: Donnamarie Poag Entered By: Donnamarie Poag on 06/01/2021 15:03:02 [name] ([id number]) -------------------------------------------------------------------------------- Patient/Caregiver Education Details Patient Name: [name]. Date of Service: 06/01/2021 2:30 PM Medical Record Number: [id number] Patient Account Number: [id number] Date of Birth/Gender: 08-20-1950 (70 y.o. F) Treating RN: Donnamarie Poag Primary Care Physician: Rutherford Guys Other Clinician: Referring Physician: Rutherford Guys Treating Physician/Extender: Yaakov Guthrie in Treatment: 9 Education Assessment Education Provided To: Patient Education Topics Provided Basic Hygiene: Venous: Wound/Skin Impairment: Electronic Signature(s) Signed: 06/01/2021 4:15:55 PM By: Donnamarie Poag Entered By: Donnamarie Poag on 06/01/2021 15:21:31 [name] ([id number]) -------------------------------------------------------------------------------- Wound Assessment Details Patient Name: [name]. Date of Service: 06/01/2021 2:30 PM Medical Record Number: [id number] Patient Account Number: [id number] Date of Birth/Sex: 04-10-1951 (70 y.o. F) Treating RN: Donnamarie Poag Primary Care Jerzi Tigert: Rutherford Guys Other Clinician: Referring Meiah Zamudio: Rutherford Guys Treating Yovani Cogburn/Extender: Yaakov Guthrie in Treatment: 9 Wound Status Wound Number: 3 Primary Diabetic Wound/Ulcer of the Lower Extremity Etiology: Wound Location: Left Toe Great Wound Status: Open Wounding Event: Gradually Appeared Comorbid Cataracts, Arrhythmia, Hypertension, Type II  Diabetes, Date Acquired: 02/09/2021 History: Osteoarthritis, Neuropathy Weeks Of Treatment: 9 Clustered Wound: No Photos Wound Measurements Length: (cm) 0.2 Width: (cm) 0.2 Depth: (cm) 0.1 Area: (cm) 0.031 Volume: (cm) 0.003 % Reduction in Area: 67% % Reduction in Volume: 66.7% Epithelialization: None Tunneling: No Undermining: No Wound Description Classification: Grade 1 Exudate Amount: Medium Exudate Type: Serosanguineous Exudate Color: red, brown Foul Odor After Cleansing: No Slough/Fibrino Yes Wound Bed Granulation Amount: None Present (0%) Exposed Structure Necrotic Amount: Large (67-100%) Fascia Exposed: No Necrotic Quality: Eschar Fat Layer (Subcutaneous Tissue) Exposed: No Tendon Exposed: No Muscle Exposed: No Joint Exposed: No Bone Exposed: No Treatment Notes Wound #3 (Toe Great) Wound Laterality: Left Cleanser Normal Saline Discharge Instruction: Wash your hands with soap and water. Remove old dressing, discard into plastic bag and place into trash. Cleanse the wound with Normal Saline prior to applying a clean dressing using gauze sponges, not tissues or cotton balls. Do not scrub or use excessive force. Pat dry using gauze sponges, not tissue or cotton balls. Soap and [address] PRINCESS, KARNES ([id number]) Discharge Instruction: Gently cleanse wound with antibacterial soap, rinse and pat dry prior to dressing wounds Peri-Wound Care Topical Primary Dressing Hydrofera Blue Ready Transfer Foam, 2.5x2.5 (in/in) Discharge Instruction: Apply Hydrofera Blue Ready to wound bed as directed Secondary Dressing Gauze Secured With 67M Medipore H Soft Cloth Surgical Tape, 2x2 (in/yd) Compression Wrap Compression Stockings Circaid Juxta Lite Compression Wrap Quantity: 1 Left Leg Compression Amount: 20-30 mmHg Discharge Instruction: Apply Circaid Juxta Lite Compression Wrap as directed Add-Ons Electronic Signature(s) Signed: 06/01/2021 4:15:55 PM By: Donnamarie Poag Entered By: Donnamarie Poag on 06/01/2021 15:08:55 [name] ([id number]) -------------------------------------------------------------------------------- Wound Assessment Details Patient Name: [name]. Date of Service: 06/01/2021 2:30 PM Medical Record Number: [id number] Patient Account Number: [id number] Date of Birth/Sex: [birth year] (70 y.o. F) Treating RN: Donnamarie Poag Primary Care Ryota Treece: Rutherford Guys Other Clinician: Referring  Derika Eckles: Rutherford Guys Treating Ifeoma Vallin/Extender: Yaakov Guthrie in Treatment: 9 Wound Status Wound Number: 5 Primary Venous Leg Ulcer Etiology: Wound Location: Right, Medial Lower Leg Wound Status: Open Wounding Event: Gradually Appeared Comorbid Cataracts, Arrhythmia, Hypertension, Type II Diabetes, Date Acquired: 04/18/2021 History: Osteoarthritis, Neuropathy Weeks Of Treatment: 6 Clustered Wound: Yes Photos Wound Measurements Length: (cm) 1.7 Width: (cm) 1.5 Depth: (cm) 0.1 Area: (cm) 2.003 Volume: (cm) 0.2 % Reduction in Area: -183.3% % Reduction in Volume: -41.8% Epithelialization: None Tunneling: No Undermining: No Wound Description Classification: Full Thickness Without Exposed Support Structures Exudate Amount: Large Exudate Type: Serous Exudate Color: amber Foul Odor After Cleansing: No Slough/Fibrino Yes Wound Bed Granulation Amount: Large (67-100%) Exposed Structure Granulation Quality: Red, Pink Fascia Exposed: No Necrotic Amount: Small (1-33%) Fat Layer (Subcutaneous Tissue) Exposed: Yes Necrotic Quality: Adherent Slough Tendon Exposed: No Muscle Exposed: No Joint Exposed: No Bone Exposed: No Treatment Notes Wound #5 (Lower Leg) Wound Laterality: Right, Medial Cleanser Wound Cleanser Discharge Instruction: Wash your hands with soap and water. Remove old dressing, discard into plastic bag and place into trash. Cleanse the wound with Wound Cleanser prior to applying a clean dressing using  gauze sponges, not tissues or cotton balls. Do not scrub or use excessive force. Pat dry using gauze sponges, not tissue or cotton balls. ALTAMESE, DEGUIRE ([id number]) Peri-Wound Care Topical Primary Dressing Silvercel Small 2x2 (in/in) Discharge Instruction: Apply Silvercel Small 2x2 (in/in) as instructed Secondary Dressing ABD Pad 5x9 (in/in) Discharge Instruction: Cover with ABD pad Secured With Compression Wrap Profore Lite LF 3 Multilayer Compression Bandaging System Discharge Instruction: Apply 3 multi-layer wrap as prescribed. Compression Stockings Add-Ons Electronic Signature(s) Signed: 06/01/2021 4:15:55 PM By: Donnamarie Poag Entered By: Donnamarie Poag on 06/01/2021 15:09:49 [name] ([id number]) -------------------------------------------------------------------------------- Wound Assessment Details Patient Name: [name]. Date of Service: 06/01/2021 2:30 PM Medical Record Number: [id number] Patient Account Number: [id number] Date of Birth/Sex: 01-22-1951 (70 y.o. F) Treating RN: Donnamarie Poag Primary Care Dathan Attia: Rutherford Guys Other Clinician: Referring Anastasiya Gowin: Rutherford Guys Treating Stina Gane/Extender: Yaakov Guthrie in Treatment: 9 Wound Status Wound Number: 9 Primary Venous Leg Ulcer Etiology: Wound Location: Right Lower Leg Wound Status: Open Wounding Event: Gradually Appeared Comorbid Cataracts, Arrhythmia, Hypertension, Type II Diabetes, Date Acquired: 06/01/2021 History: Osteoarthritis, Neuropathy Weeks Of Treatment: 0 Clustered Wound: No Photos Wound Measurements Length: (cm) 2.2 Width: (cm) 1.5 Depth: (cm) 0.1 Area: (cm) 2.592 Volume: (cm) 0.259 % Reduction in Area: % Reduction in Volume: Tunneling: No Undermining: No Wound Description Classification: Full Thickness Without Exposed Support Structures Exudate Amount: Medium Exudate Type: Serosanguineous Exudate Color: red, brown Foul Odor After Cleansing: No Slough/Fibrino  Yes Wound Bed Granulation Amount: Medium (34-66%) Exposed Structure Granulation Quality: Red, Pink Fascia Exposed: No Necrotic Amount: Medium (34-66%) Fat Layer (Subcutaneous Tissue) Exposed: Yes Necrotic Quality: Adherent Slough Tendon Exposed: No Muscle Exposed: No Joint Exposed: No Bone Exposed: No Treatment Notes Wound #9 (Lower Leg) Wound Laterality: Right Cleanser Wound Cleanser Discharge Instruction: Wash your hands with soap and water. Remove old dressing, discard into plastic bag and place into trash. Cleanse the wound with Wound Cleanser prior to applying a clean dressing using gauze sponges, not tissues or cotton balls. Do not scrub or use excessive force. Pat dry using gauze sponges, not tissue or cotton balls. BETHANY, HIRT ([id number]) Peri-Wound Care Topical Primary Dressing Silvercel Small 2x2 (in/in) Discharge Instruction: Apply Silvercel Small 2x2 (in/in) as instructed Secondary Dressing ABD Pad 5x9 (in/in) Discharge Instruction: Cover with ABD pad Secured With  Compression Wrap Profore Lite LF 3 Multilayer Compression Bandaging System Discharge Instruction: Apply 3 multi-layer wrap as prescribed. Compression Stockings Add-Ons Electronic Signature(s) Signed: 06/01/2021 4:15:55 PM By: Donnamarie Poag Entered By: Donnamarie Poag on 06/01/2021 15:11:42 [name] ([id number]) -------------------------------------------------------------------------------- Vitals Details Patient Name: [name]. Date of Service: 06/01/2021 2:30 PM Medical Record Number: [id number] Patient Account Number: [id number] Date of Birth/Sex: 09-19-1950 (70 y.o. F) Treating RN: Donnamarie Poag Primary Care Lendon George: Rutherford Guys Other Clinician: Referring Ved Martos: Rutherford Guys Treating Aidaly Cordner/Extender: Yaakov Guthrie in Treatment: 9 Vital Signs Time Taken: 15:00 Temperature (F): 98.2 Height (in): 66 Pulse (bpm): 93 Weight (lbs): 332 Respiratory Rate (breaths/min):  18 Body Mass Index (BMI): 53.6 Blood Pressure (mmHg): 139/67 Reference Range: 80 - 120 mg / dl Electronic Signature(s) Signed: 06/01/2021 4:15:55 PM By: Donnamarie Poag Entered ByDonnamarie Poag on 06/01/2021 15:02:48

## 2021-06-01 NOTE — Progress Notes (Signed)
Miranda, [name] ([id number]) Visit Report for 06/01/2021 Chief Complaint Document Details Patient Name: [name], [name]. Date of Service: 06/01/2021 2:30 PM Medical Record Number: [id number] Patient Account Number: [id number] Date of Birth/Sex: 1950/11/03 (70 y.o. F) Treating RN: Donnamarie Poag Primary Care Provider: Rutherford Guys Other Clinician: Referring Provider: Rutherford Guys Treating Provider/Extender: Yaakov Guthrie in Treatment: 9 Information Obtained from: Patient Chief Complaint Bilateral lower extremity wounds Electronic Signature(s) Signed: 06/01/2021 3:38:30 PM By: Kalman Shan DO Entered By: Kalman Shan on 06/01/2021 15:26:35 [name] ([id number]) -------------------------------------------------------------------------------- HPI Details Patient Name: [name]. Date of Service: 06/01/2021 2:30 PM Medical Record Number: [id number] Patient Account Number: [id number] Date of Birth/Sex: [birth year] (70 y.o. F) Treating RN: Donnamarie Poag Primary Care Provider: Rutherford Guys Other Clinician: Referring Provider: Rutherford Guys Treating Provider/Extender: Yaakov Guthrie in Treatment: 9 History of Present Illness HPI Description: Admission 10/5 [name] is a 70 year old female with a past medical history of hypertension, venous insufficiency, chronic diastolic heart failure and insulin-dependent type 2 diabetes that presents To the clinic for bilateral lower extremity wounds. She states that her right lower extremity developed a blister 1 week ago and she has been keeping the area covered with a bandaid. She states that when the Band-Aid came off it tore her skin. She subsequently developed pain, increased warmth and erythema to the right lower extremity. She denies purulent drainage. She has also developed an open wound to the left lower extremity that recently developed. She also has a small open wound to the proximal portion of the  left great toe. This has been present since August and was evaluated by podiatry. It has not healed and she has not followed up with podiatry for this issue. 10/12; patient presents for follow-up. She states she has 2 days left on her antibiotics. She is tolerated this well. She reports improvement to her right lower extremity symptoms. She denies signs of infection. She has home health. 10/26; patient presents for follow-up. She has no issues or complaints today. The compression wraps slid down to her shin. These were placed on Monday by home health. She has a couple of new open wounds due to this. She denies signs of infection. 11/2; patient presents for follow-up. The compression wraps had slid down to her shin again. The wraps taken off today do not appear to be appropriate material for 3 layer compression wrap. She currently denies signs of infection. 11/9; patient presents for follow-up. She was able to increase her diuretic over the past week. Again home health does not have appropriate materials for 3 layer compression. She currently denies signs of infection. 11/16; patient presents for follow-up. She has no issues or complaints today. She currently denies signs of infection. 11/30; patient presents for follow-up. The wrap continues to have a hard time staying in place. Overall she is doing well with no issues or complaints today. 12/7; patient presents for follow-up. She brought her juxta light compression today. She states however she cannot get these on daily. She has no issues or complaints today. Electronic Signature(s) Signed: 06/01/2021 3:38:30 PM By: Kalman Shan DO Entered By: Kalman Shan on 06/01/2021 15:27:22 [name] ([id number]) -------------------------------------------------------------------------------- Physical Exam Details Patient Name: [name]. Date of Service: 06/01/2021 2:30 PM Medical Record Number: [id number] Patient Account Number:  [id number] Date of Birth/Sex: 1951/04/05 (70 y.o. F) Treating RN: Donnamarie Poag Primary Care Provider: Rutherford Guys Other Clinician: Referring Provider: Rutherford Guys Treating Provider/Extender: Yaakov Guthrie in Treatment: 9  Constitutional . Cardiovascular . Psychiatric . Notes Patient has 2 wounds remaining to her right lower extremity. These have granulation tissue present. No signs of infection. She has a scab to the left great toe. She has no open wounds of the left lower extremity. Electronic Signature(s) Signed: 06/01/2021 3:38:30 PM By: Kalman Shan DO Entered By: Kalman Shan on 06/01/2021 15:30:44 [name] ([id number]) -------------------------------------------------------------------------------- Physician Orders Details Patient Name: [name]. Date of Service: 06/01/2021 2:30 PM Medical Record Number: [id number] Patient Account Number: [id number] Date of Birth/Sex: Jan 29, 1951 (70 y.o. F) Treating RN: Donnamarie Poag Primary Care Provider: Rutherford Guys Other Clinician: Referring Provider: Rutherford Guys Treating Provider/Extender: Yaakov Guthrie in Treatment: 9 Verbal / Phone Orders: No Diagnosis Coding Follow-up Appointments o Return Appointment in 1 week. o Nurse Visit as needed [place] for wound care. May utilize formulary equivalent dressing for wound treatment orders unless otherwise specified. Home Health Nurse may visit PRN to address patientos wound care needs. o Scheduled days for dressing changes to be completed; exception, patient has scheduled wound care visit that day. o **Please direct any NON-WOUND related issues/requests for orders to patient's Primary Care Physician. **If current dressing causes regression in wound condition, may D/C ordered dressing product/s and apply Normal Saline Moist Dressing daily until next [place] or Other MD  appointment. **Notify W[facility]aling Center of regression in wound condition at [place] Bathing/ Shower/ Hygiene o May shower; gently cleanse wound with antibacterial soap, rinse and pat dry prior to dressing wounds o May shower with wound dressing protected with water repellent cover or cast protector. o No tub bath. Anesthetic (Use 'Patient Medications' Section for Anesthetic Order Entry) o Lidocaine applied to wound bed Edema Control - Lymphedema / Segmental Compressive Device / Other o Optional: One layer of unna paste to top of compression wrap (to act as an anchor). - please use or compression wrap will not stay up o Elevate leg(s) parallel to the floor when sitting. o DO YOUR BEST to sleep in the bed at night. DO NOT sleep in your recliner. Long hours of sitting in a recliner leads to swelling of the legs and/or potential wounds on your backside. Additional Orders / Instructions o Follow Nutritious Diet and Increase Protein Intake Wound Treatment Wound #3 - Toe Great Wound Laterality: Left Cleanser: Normal Saline 3 x Per Week/30 Days Discharge Instructions: Wash your hands with soap and water. Remove old dressing, discard into plastic bag and place into trash. Cleanse the wound with Normal Saline prior to applying a clean dressing using gauze sponges, not tissues or cotton balls. Do not scrub or use excessive force. Pat dry using gauze sponges, not tissue or cotton balls. Cleanser: Soap and Water 3 x Per Week/30 Days Discharge Instructions: Gently cleanse wound with antibacterial soap, rinse and pat dry prior to dressing wounds Primary Dressing: Hydrofera Blue Ready Transfer Foam, 2.5x2.5 (in/in) 3 x Per Week/30 Days Discharge Instructions: Apply Hydrofera Blue Ready to wound bed as directed Secondary Dressing: Gauze 3 x Per Week/30 Days Secured With: 45M Medipore H Soft Cloth Surgical Tape, 2x2 (in/yd) 3 x Per Week/30 Days Compression  Wrap: Profore Lite LF 3 Multilayer Compression Bandaging System 3 x Per Week/30 Days Discharge Instructions: Apply 3 multi-layer wrap as prescribed. Compression Stockings: Circaid Juxta Lite Compression Wrap Left Leg Compression Amount: 20-30 mmHG Discharge Instructions: Apply Circaid Juxta Lite Compression Wrap as directed Gipe, Franchelle  F. ([id number]) Wound #5 - Lower Leg Wound Laterality: Right, Medial Cleanser: Wound Cleanser 3 x Per Week/30 Days Discharge Instructions: Wash your hands with soap and water. Remove old dressing, discard into plastic bag and place into trash. Cleanse the wound with Wound Cleanser prior to applying a clean dressing using gauze sponges, not tissues or cotton balls. Do not scrub or use excessive force. Pat dry using gauze sponges, not tissue or cotton balls. Primary Dressing: Silvercel Small 2x2 (in/in) 3 x Per Week/30 Days Discharge Instructions: Apply Silvercel Small 2x2 (in/in) as instructed Secondary Dressing: ABD Pad 5x9 (in/in) 3 x Per Week/30 Days Discharge Instructions: Cover with ABD pad Compression Wrap: Profore Lite LF 3 Multilayer Compression Bandaging System (Generic) 3 x Per Week/30 Days Discharge Instructions: Apply 3 multi-layer wrap as prescribed. Wound #9 - Lower Leg Wound Laterality: Right Cleanser: Wound Cleanser 3 x Per Week/30 Days Discharge Instructions: Wash your hands with soap and water. Remove old dressing, discard into plastic bag and place into trash. Cleanse the wound with Wound Cleanser prior to applying a clean dressing using gauze sponges, not tissues or cotton balls. Do not scrub or use excessive force. Pat dry using gauze sponges, not tissue or cotton balls. Primary Dressing: Silvercel Small 2x2 (in/in) 3 x Per Week/30 Days Discharge Instructions: Apply Silvercel Small 2x2 (in/in) as instructed Secondary Dressing: ABD Pad 5x9 (in/in) 3 x Per Week/30 Days Discharge Instructions: Cover with ABD pad Compression Wrap: Profore  Lite LF 3 Multilayer Compression Bandaging System (Generic) 3 x Per Week/30 Days Discharge Instructions: Apply 3 multi-layer wrap as prescribed. Electronic Signature(s) Signed: 06/01/2021 3:38:30 PM By: Kalman Shan DO Entered By: Kalman Shan on 06/01/2021 15:37:34 [name] ([id number]) -------------------------------------------------------------------------------- Problem List Details Patient Name: [name]. Date of Service: 06/01/2021 2:30 PM Medical Record Number: [id number] Patient Account Number: [id number] Date of Birth/Sex: 07-Aug-1950 (70 y.o. F) Treating RN: Donnamarie Poag Primary Care Provider: Rutherford Guys Other Clinician: Referring Provider: Rutherford Guys Treating Provider/Extender: Yaakov Guthrie in Treatment: 9 Active Problems ICD-10 Encounter Code Description Active Date MDM Diagnosis I87.331 Chronic venous hypertension (idiopathic) with ulcer and inflammation of 03/30/2021 No Yes right lower extremity I87.332 Chronic venous hypertension (idiopathic) with ulcer and inflammation of 03/30/2021 No Yes left lower extremity S91.302D Unspecified open wound, left foot, subsequent encounter 03/30/2021 No Yes Inactive Problems Resolved Problems Electronic Signature(s) Signed: 06/01/2021 3:38:30 PM By: Kalman Shan DO Entered By: Kalman Shan on 06/01/2021 15:26:28 [name] ([id number]) -------------------------------------------------------------------------------- Progress Note Details Patient Name: [name]. Date of Service: 06/01/2021 2:30 PM Medical Record Number: [id number] Patient Account Number: [id number] Date of Birth/Sex: 05-02-1951 (70 y.o. F) Treating RN: Donnamarie Poag Primary Care Provider: Rutherford Guys Other Clinician: Referring Provider: Rutherford Guys Treating Provider/Extender: Yaakov Guthrie in Treatment: 9 Subjective Chief Complaint Information obtained from Patient Bilateral lower extremity  wounds History of Present Illness (HPI) Admission 10/5 Ms. Kaiah Hosea is a 70 year old female with a past medical history of hypertension, venous insufficiency, chronic diastolic heart failure and insulin-dependent type 2 diabetes that presents To the clinic for bilateral lower extremity wounds. She states that her right lower extremity developed a blister 1 week ago and she has been keeping the area covered with a bandaid. She states that when the Band-Aid came off it tore her skin. She subsequently developed pain, increased warmth and erythema to the right lower extremity. She denies purulent drainage. She has also developed an open wound to the left lower extremity that recently developed. She  also has a small open wound to the proximal portion of the left great toe. This has been present since August and was evaluated by podiatry. It has not healed and she has not followed up with podiatry for this issue. 10/12; patient presents for follow-up. She states she has 2 days left on her antibiotics. She is tolerated this well. She reports improvement to her right lower extremity symptoms. She denies signs of infection. She has home health. 10/26; patient presents for follow-up. She has no issues or complaints today. The compression wraps slid down to her shin. These were placed on Monday by home health. She has a couple of new open wounds due to this. She denies signs of infection. 11/2; patient presents for follow-up. The compression wraps had slid down to her shin again. The wraps taken off today do not appear to be appropriate material for 3 layer compression wrap. She currently denies signs of infection. 11/9; patient presents for follow-up. She was able to increase her diuretic over the past week. Again home health does not have appropriate materials for 3 layer compression. She currently denies signs of infection. 11/16; patient presents for follow-up. She has no issues or complaints today.  She currently denies signs of infection. 11/30; patient presents for follow-up. The wrap continues to have a hard time staying in place. Overall she is doing well with no issues or complaints today. 12/7; patient presents for follow-up. She brought her juxta light compression today. She states however she cannot get these on daily. She has no issues or complaints today. Objective Constitutional Vitals Time Taken: 3:00 PM, Height: 66 in, Weight: 332 lbs, BMI: 53.6, Temperature: 98.2 F, Pulse: 93 bpm, Respiratory Rate: 18 breaths/min, Blood Pressure: 139/67 mmHg. General Notes: Patient has 2 wounds remaining to her right lower extremity. These have granulation tissue present. No signs of infection. She has a scab to the left great toe. She has no open wounds of the left lower extremity. Integumentary (Hair, Skin) Wound #3 status is Open. Original cause of wound was Gradually Appeared. The date acquired was: 02/09/2021. The wound has been in treatment 9 weeks. The wound is located on the Left Toe Great. The wound measures 0.2cm length x 0.2cm width x 0.1cm depth; 0.031cm^2 area and 0.003cm^3 volume. There is no tunneling or undermining noted. There is a medium amount of serosanguineous drainage noted. There is no granulation within the wound bed. There is a large (67-100%) amount of necrotic tissue within the wound bed including Eschar. Wound #5 status is Open. Original cause of wound was Gradually Appeared. The date acquired was: 04/18/2021. The wound has been in treatment 6 weeks. The wound is located on the Right,Medial Lower Leg. The wound measures 1.7cm length x 1.5cm width x 0.1cm depth; 2.003cm^2 area and 0.2cm^3 volume. There is Fat Layer (Subcutaneous Tissue) exposed. There is no tunneling or undermining noted. There is a large amount of serous drainage noted. There is large (67-100%) red, pink granulation within the wound bed. There is a small (1-33%) amount of necrotic tissue within the  wound bed including Adherent Slough. ERNESTEEN, MIHALIC ([id number]) Wound #9 status is Open. Original cause of wound was Gradually Appeared. The date acquired was: 06/01/2021. The wound is located on the Right Lower Leg. The wound measures 2.2cm length x 1.5cm width x 0.1cm depth; 2.592cm^2 area and 0.259cm^3 volume. There is Fat Layer (Subcutaneous Tissue) exposed. There is no tunneling or undermining noted. There is a medium amount of serosanguineous drainage noted.  There is medium (34-66%) red, pink granulation within the wound bed. There is a medium (34-66%) amount of necrotic tissue within the wound bed including Adherent Slough. Assessment Active Problems ICD-10 Chronic venous hypertension (idiopathic) with ulcer and inflammation of right lower extremity Chronic venous hypertension (idiopathic) with ulcer and inflammation of left lower extremity Unspecified open wound, left foot, subsequent encounter Patient has 2 remaining wounds to her right lower extremity. No signs of infection. I recommended continuing with silver alginate under compression. Her left lower extremity has no open wounds and I recommended using the juxta light compression. However, she states she will not be able to put these on daily. We had a long discussion about the importance of compression therapy once her wounds are closed. She states she will try to find someone to help with these daily. In the meantime we will continue to wrap her left leg in office. She has a scab On the left great toe and at this time there is nothing further to do. Procedures Wound #3 Pre-procedure diagnosis of Wound #3 is a Diabetic Wound/Ulcer of the Lower Extremity located on the Left Toe Great . There was a Three Layer Compression Therapy Procedure by Donnamarie Poag, RN. Post procedure Diagnosis Wound #3: Same as Pre-Procedure Wound #5 Pre-procedure diagnosis of Wound #5 is a Venous Leg Ulcer located on the Right,Medial Lower Leg . There was  a Three Layer Compression Therapy Procedure by Donnamarie Poag, RN. Post procedure Diagnosis Wound #5: Same as Pre-Procedure Plan Follow-up Appointments: Return Appointment in 1 week. Nurse Visit as needed Home Health: [place]: - [place] for wound care. May utilize formulary equivalent dressing for wound treatment orders unless otherwise specified. Home Health Nurse may visit PRN to address patient s wound care needs. Scheduled days for dressing changes to be completed; exception, patient has scheduled wound care visit that day. **Please direct any NON-WOUND related issues/requests for orders to patient's Primary Care Physician. **If current dressing causes regression in wound condition, may D/C ordered dressing product/s and apply Normal Saline Moist Dressing daily until next [place] or Other MD appointment. **Notify W[facility]aling Center of regression in wound condition at [place] Bathing/ Shower/ Hygiene: May shower; gently cleanse wound with antibacterial soap, rinse and pat dry prior to dressing wounds May shower with wound dressing protected with water repellent cover or cast protector. No tub bath. Anesthetic (Use 'Patient Medications' Section for Anesthetic Order Entry): Lidocaine applied to wound bed Edema Control - Lymphedema / Segmental Compressive Device / Other: Optional: One layer of unna paste to top of compression wrap (to act as an anchor). - please use or compression wrap will not stay up Elevate leg(s) parallel to the floor when sitting. DO YOUR BEST to sleep in the bed at night. DO NOT sleep in your recliner. Long hours of sitting in a recliner leads to swelling of the legs and/or potential wounds on your backside. Additional Orders / Instructions: LELAINA, OATIS ([id number]) Follow Nutritious Diet and Increase Protein Intake WOUND #3: - Toe Great Wound Laterality: Left Cleanser: Normal Saline  3 x Per Week/30 Days Discharge Instructions: Wash your hands with soap and water. Remove old dressing, discard into plastic bag and place into trash. Cleanse the wound with Normal Saline prior to applying a clean dressing using gauze sponges, not tissues or cotton balls. Do not scrub or use excessive force. Pat dry using gauze sponges, not tissue or cotton balls. Cleanser: Soap  and Water 3 x Per Week/30 Days Discharge Instructions: Gently cleanse wound with antibacterial soap, rinse and pat dry prior to dressing wounds Primary Dressing: Hydrofera Blue Ready Transfer Foam, 2.5x2.5 (in/in) 3 x Per Week/30 Days Discharge Instructions: Apply Hydrofera Blue Ready to wound bed as directed Secondary Dressing: Gauze 3 x Per Week/30 Days Secured With: 63M Medipore H Soft Cloth Surgical Tape, 2x2 (in/yd) 3 x Per Week/30 Days Compression Wrap: Profore Lite LF 3 Multilayer Compression Bandaging System 3 x Per Week/30 Days Discharge Instructions: Apply 3 multi-layer wrap as prescribed. Compression Stockings: Circaid Juxta Lite Compression Wrap Compression Amount: 20-30 mmHg (left) Discharge Instructions: Apply Circaid Juxta Lite Compression Wrap as directed WOUND #5: - Lower Leg Wound Laterality: Right, Medial Cleanser: Wound Cleanser 3 x Per Week/30 Days Discharge Instructions: Wash your hands with soap and water. Remove old dressing, discard into plastic bag and place into trash. Cleanse the wound with Wound Cleanser prior to applying a clean dressing using gauze sponges, not tissues or cotton balls. Do not scrub or use excessive force. Pat dry using gauze sponges, not tissue or cotton balls. Primary Dressing: Silvercel Small 2x2 (in/in) 3 x Per Week/30 Days Discharge Instructions: Apply Silvercel Small 2x2 (in/in) as instructed Secondary Dressing: ABD Pad 5x9 (in/in) 3 x Per Week/30 Days Discharge Instructions: Cover with ABD pad Compression Wrap: Profore Lite LF 3 Multilayer Compression Bandaging  System (Generic) 3 x Per Week/30 Days Discharge Instructions: Apply 3 multi-layer wrap as prescribed. WOUND #9: - Lower Leg Wound Laterality: Right Cleanser: Wound Cleanser 3 x Per Week/30 Days Discharge Instructions: Wash your hands with soap and water. Remove old dressing, discard into plastic bag and place into trash. Cleanse the wound with Wound Cleanser prior to applying a clean dressing using gauze sponges, not tissues or cotton balls. Do not scrub or use excessive force. Pat dry using gauze sponges, not tissue or cotton balls. Primary Dressing: Silvercel Small 2x2 (in/in) 3 x Per Week/30 Days Discharge Instructions: Apply Silvercel Small 2x2 (in/in) as instructed Secondary Dressing: ABD Pad 5x9 (in/in) 3 x Per Week/30 Days Discharge Instructions: Cover with ABD pad Compression Wrap: Profore Lite LF 3 Multilayer Compression Bandaging System (Generic) 3 x Per Week/30 Days Discharge Instructions: Apply 3 multi-layer wrap as prescribed. 1. 3 layer compression 2. Silver alginate 3. Follow-up in 1 week Electronic Signature(s) Signed: 06/01/2021 3:38:30 PM By: Kalman Shan DO Entered By: Kalman Shan on 06/01/2021 15:36:48 [name] ([id number]) -------------------------------------------------------------------------------- ROS/PFSH Details Patient Name: [name]. Date of Service: 06/01/2021 2:30 PM Medical Record Number: [id number] Patient Account Number: [id number] Date of Birth/Sex: [birth year] (70 y.o. F) Treating RN: Donnamarie Poag Primary Care Provider: Rutherford Guys Other Clinician: Referring Provider: Rutherford Guys Treating Provider/Extender: Yaakov Guthrie in Treatment: 9 Information Obtained From Patient Eyes Medical History: Positive for: Cataracts Negative for: Glaucoma; Optic Neuritis Ear/Nose/Mouth/Throat Medical History: Negative for: Chronic sinus problems/congestion; Middle ear problems Hematologic/Lymphatic Medical History: Negative  for: Anemia; Hemophilia; Human Immunodeficiency Virus; Lymphedema; Sickle Cell Disease Respiratory Medical History: Negative for: Aspiration; Asthma; Chronic Obstructive Pulmonary Disease (COPD); Pneumothorax; Sleep Apnea; Tuberculosis Cardiovascular Medical History: Positive for: Arrhythmia - Afib; Hypertension Negative for: Angina; Congestive Heart Failure; Coronary Artery Disease; Deep Vein Thrombosis; Hypotension; Myocardial Infarction; Peripheral Arterial Disease; Peripheral Venous Disease; Phlebitis; Vasculitis Past Medical History Notes: "heart valve problem" Gastrointestinal Medical History: Negative for: Cirrhosis ; Colitis; Crohnos; Hepatitis A; Hepatitis B; Hepatitis C Endocrine Medical History: Positive for: Type II Diabetes Negative for: Type I Diabetes Time with diabetes: since  2007 Treated with: Insulin, Oral agents Blood sugar tested every day: Yes Tested : daily Genitourinary Medical History: Negative for: End Stage Renal Disease Immunological Medical History: Negative for: Lupus Erythematosus; Raynaudos; Scleroderma Integumentary (Skin) SHIFRA, SWARTZENTRUBER ([id number]) Medical History: Negative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

## 2021-06-08 ENCOUNTER — Other Ambulatory Visit: Payer: Self-pay

## 2021-06-08 ENCOUNTER — Encounter (HOSPITAL_BASED_OUTPATIENT_CLINIC_OR_DEPARTMENT_OTHER): Payer: Medicare Other | Admitting: Internal Medicine

## 2021-06-08 DIAGNOSIS — I87331 Chronic venous hypertension (idiopathic) with ulcer and inflammation of right lower extremity: Secondary | ICD-10-CM

## 2021-06-08 DIAGNOSIS — I87333 Chronic venous hypertension (idiopathic) with ulcer and inflammation of bilateral lower extremity: Secondary | ICD-10-CM | POA: Diagnosis not present

## 2021-06-08 NOTE — Progress Notes (Signed)
KARINNA, BEADLES ([id number]) Visit Report for 06/08/2021 Arrival Information Details Patient Name: [name], [name]. Date of Service: 06/08/2021 2:30 PM Medical Record Number: [id number] Patient Account Number: [id number] Date of Birth/Sex: 1950-10-16 (70 y.o. F) Treating RN: Donnamarie Poag Primary Care Merit Maybee: Rutherford Guys Other Clinician: Referring Trina Asch: Rutherford Guys Treating Katrine Radich/Extender: Yaakov Guthrie in Treatment: 10 Visit Information History Since Last Visit Added or deleted any medications: No Patient Arrived: [name] Had a fall or experienced change in No Arrival Time: 14:26 activities of daily living that may affect Accompanied By: self risk of falls: Transfer Assistance: [employer] Patient Lift Hospitalized since last visit: No Patient Identification Verified: Yes Has Dressing in Place as Prescribed: Yes Secondary Verification Process Completed: Yes Has Compression in Place as Prescribed: Yes Patient Requires Transmission-Based No Pain Present Now: No Precautions: Patient Has Alerts: Yes Patient Alerts: Patient on Blood Thinner DIABETIC ELIQUIS Electronic Signature(s) Signed: 06/08/2021 4:05:03 PM By: Donnamarie Poag Entered By: Donnamarie Poag on 06/08/2021 14:27:16 [name] ([id number]) -------------------------------------------------------------------------------- Compression Therapy Details Patient Name: [name]. Date of Service: 06/08/2021 2:30 PM Medical Record Number: [id number] Patient Account Number: [id number] Date of Birth/Sex: 03-03-1951 (70 y.o. F) Treating RN: Donnamarie Poag Primary Care Ajwa Kimberley: Rutherford Guys Other Clinician: Referring Alieyah Spader: Rutherford Guys Treating Aizah Gehlhausen/Extender: Yaakov Guthrie in Treatment: 10 Compression Therapy Performed for Wound Assessment: Wound #3 Left Toe Great Performed By: Junius Argyle, RN Compression Type: Three Layer Post Procedure Diagnosis Same as  Pre-procedure Electronic Signature(s) Signed: 06/08/2021 4:05:03 PM By: Donnamarie Poag Entered By: Donnamarie Poag on 06/08/2021 14:50:35 [name] ([id number]) -------------------------------------------------------------------------------- Compression Therapy Details Patient Name: [name]. Date of Service: 06/08/2021 2:30 PM Medical Record Number: [id number] Patient Account Number: [id number] Date of Birth/Sex: 16-Nov-1950 (70 y.o. F) Treating RN: Donnamarie Poag Primary Care Vennie Salsbury: Rutherford Guys Other Clinician: Referring Aaliyha Mumford: Rutherford Guys Treating Curby Carswell/Extender: Yaakov Guthrie in Treatment: 10 Compression Therapy Performed for Wound Assessment: Wound #5 Right,Medial Lower Leg Performed By: Clinician Donnamarie Poag, RN Compression Type: Three Layer Post Procedure Diagnosis Same as Pre-procedure Electronic Signature(s) Signed: 06/08/2021 4:05:03 PM By: Donnamarie Poag Entered By: Donnamarie Poag on 06/08/2021 14:50:35 [name] ([id number]) -------------------------------------------------------------------------------- Encounter Discharge Information Details Patient Name: [name]. Date of Service: 06/08/2021 2:30 PM Medical Record Number: [id number] Patient Account Number: [id number] Date of Birth/Sex: 1950-12-07 (70 y.o. F) Treating RN: Donnamarie Poag Primary Care Bellah Alia: Rutherford Guys Other Clinician: Referring Nashonda Limberg: Rutherford Guys Treating Jeramy Dimmick/Extender: Yaakov Guthrie in Treatment: 10 Encounter Discharge Information Items Discharge Condition: Stable Ambulatory Status: Walker Discharge Destination: Home Transportation: Private Auto Accompanied By: self Schedule Follow-up Appointment: Yes Clinical Summary of Care: Electronic Signature(s) Signed: 06/08/2021 4:05:03 PM By: Donnamarie Poag Entered By: Donnamarie Poag on 06/08/2021 15:05:41 [name]  ([id number]) -------------------------------------------------------------------------------- Lower Extremity Assessment Details Patient Name: [name]. Date of Service: 06/08/2021 2:30 PM Medical Record Number: [id number] Patient Account Number: [id number] Date of Birth/Sex: [birth year] (70 y.o. F) Treating RN: Donnamarie Poag Primary Care Aldin Drees: Rutherford Guys Other Clinician: Referring Vandana Haman: Rutherford Guys Treating Earnstine Meinders/Extender: Yaakov Guthrie in Treatment: 10 Edema Assessment Assessed: [Left: Yes] [Right: Yes] Edema: [Left: No] [Right: Yes] Calf Left: Right: Point of Measurement: 29 cm From Medial Instep 40.5 cm 52 cm Ankle Left: Right: Point of Measurement: 10 cm From Medial Instep 20 cm 21.5 cm Vascular Assessment Pulses: Dorsalis Pedis Palpable: [Left:Yes] [Right:Yes] Electronic Signature(s) Signed: 06/08/2021 4:05:03 PM By: Donnamarie Poag Entered ByDonnamarie Poag on 06/08/2021 14:43:58 [name] ([id number]) --------------------------------------------------------------------------------  Multi Wound Chart Details Patient Name: [name], [name]. Date of Service: 06/08/2021 2:30 PM Medical Record Number: [id number] Patient Account Number: [id number] Date of Birth/Sex: 05-20-1951 (70 y.o. 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80% Classification: Partial Thickness Grade 1 Full Thickness Without Exposed Support Structures Exudate Amount: Medium Medium Large Exudate Type: Serosanguineous Serosanguineous Serous Exudate Color: red, brown red, brown amber Granulation Amount: Large (67-100%) None Present (0%) Large (67-100%) Granulation Quality: Red, Pink N/A Red, Pink Necrotic Amount: None Present (0%) Large (67-100%) Small (1-33%) Necrotic Tissue: N/A Eschar Adherent Slough Exposed Structures: Fascia: No Fascia: No Fat Layer (Subcutaneous Tissue): Fat Layer (Subcutaneous Tissue): Fat Layer (Subcutaneous Tissue): Yes No No Fascia: No Tendon: No Tendon: No Tendon: No Muscle: No Muscle: No Muscle: No Joint: No Joint: No Joint: No Bone: No Bone: No Bone: No Limited to Skin Breakdown Epithelialization: N/A None None Assessment Notes: noted wrap slid down below N/A N/A appropriate level Procedures Performed: N/A Compression Therapy Compression Therapy Wound Number: 9 N/A N/A Photos: N/A N/A [name] ([id number]) Wound Location: Right Lower Leg N/A N/A Wounding Event: Gradually Appeared N/A N/A Primary Etiology: Venous Leg Ulcer N/A N/A Comorbid History: Cataracts, Arrhythmia, N/A N/A Hypertension, Type II Diabetes, Osteoarthritis, Neuropathy Date Acquired: 06/01/2021 N/A N/A Weeks of Treatment: 1 N/A N/A Wound Status: Open N/A  N/A Clustered Wound: No N/A N/A Measurements L x W x D (cm) 1.5x0.7x0.1 N/A N/A Area (cm) : 0.825 N/A N/A Volume (cm) : 0.082 N/A N/A % Reduction in Area: 68.20% N/A N/A % Reduction in Volume: 68.30% N/A N/A Classification: Full Thickness Without Exposed N/A N/A Support Structures Exudate Amount: Medium N/A N/A Exudate Type: Serosanguineous N/A N/A Exudate Color: red, brown N/A N/A Granulation Amount: Large (67-100%) N/A N/A Granulation Quality: Red, Pink N/A N/A Necrotic Amount: Small (1-33%) N/A N/A Necrotic Tissue: Adherent Slough N/A N/A Exposed Structures: Fat Layer (Subcutaneous Tissue): N/A N/A Yes Fascia: No Tendon: No Muscle: No Joint: No Bone: No Epithelialization: N/A N/A N/A Assessment Notes: N/A N/A N/A Procedures Performed: N/A N/A N/A Treatment Notes Wound #10 (Lower Leg) Wound Laterality: Right, Anterior, Proximal Cleanser Wound Cleanser Discharge Instruction: Wash your hands with soap and water. Remove old dressing, discard into plastic bag and place into trash. Cleanse the wound with Wound Cleanser prior to applying a clean dressing using gauze sponges, not tissues or cotton balls. Do not scrub or use excessive force. Pat dry using gauze sponges, not tissue or cotton balls. Peri-Wound Care Topical Primary Dressing Silvercel Small 2x2 (in/in) Discharge Instruction: Apply Silvercel Small 2x2 (in/in) as instructed Secondary  Dressing ABD Pad 5x9 (in/in) Discharge Instruction: Cover with ABD pad Secured With Compression Wrap Profore Lite LF 3 Multilayer Compression Bandaging System Discharge Instruction: Apply 3 multi-layer wrap as prescribed. Compression Stockings [name], [name] ([id number]) Add-Ons Wound #3 (Toe Great) Wound Laterality: Left Cleanser Normal Saline Discharge Instruction: Wash your hands with soap and water. Remove old dressing, discard into plastic bag and place into trash. Cleanse the wound with Normal Saline prior to applying a  clean dressing using gauze sponges, not tissues or cotton balls. Do not scrub or use excessive force. Pat dry using gauze sponges, not tissue or cotton balls. Soap and Water Discharge Instruction: Gently cleanse wound with antibacterial soap, rinse and pat dry prior to dressing wounds Peri-Wound Care Topical Primary Dressing Hydrofera Blue Ready Transfer Foam, 2.5x2.5 (in/in) Discharge Instruction: Apply Hydrofera Blue Ready to wound bed as directed Secondary Dressing Gauze Secured With 79M Medipore H Soft Cloth Surgical Tape, 2x2 (in/yd) Compression Wrap Profore Lite LF 3 Multilayer Compression Bandaging System Discharge Instruction: Apply 3 multi-layer wrap as prescribed. Compression Stockings Circaid Juxta Lite Compression Wrap Quantity: 1 Left Leg Compression Amount: 20-30 mmHg Discharge Instruction: Apply Circaid Juxta Lite Compression Wrap as directed Add-Ons Wound #5 (Lower Leg) Wound Laterality: Right, Medial Cleanser Wound Cleanser Discharge Instruction: Wash your hands with soap and water. Remove old dressing, discard into plastic bag and place into trash. Cleanse the wound with Wound Cleanser prior to applying a clean dressing using gauze sponges, not tissues or cotton balls. Do not scrub or use excessive force. Pat dry using gauze sponges, not tissue or cotton balls. Peri-Wound Care Topical Primary Dressing Silvercel Small 2x2 (in/in) Discharge Instruction: Apply Silvercel Small 2x2 (in/in) as instructed Secondary Dressing ABD Pad 5x9 (in/in) Discharge Instruction: Cover with ABD pad Secured With Compression Wrap Profore Lite LF 3 Multilayer Compression Bandaging System Discharge Instruction: Apply 3 multi-layer wrap as prescribed. Compression Stockings Add-Ons [name], [name] ([id number]) Wound #9 (Lower Leg) Wound Laterality: Right Cleanser Wound Cleanser Discharge Instruction: Wash your hands with soap and water. Remove old dressing, discard into plastic  bag and place into trash. Cleanse the wound with Wound Cleanser prior to applying a clean dressing using gauze sponges, not tissues or cotton balls. Do not scrub or use excessive force. Pat dry using gauze sponges, not tissue or cotton balls. Peri-Wound Care Topical Primary Dressing Silvercel Small 2x2 (in/in) Discharge Instruction: Apply Silvercel Small 2x2 (in/in) as instructed Secondary Dressing ABD Pad 5x9 (in/in) Discharge Instruction: Cover with ABD pad Secured With Compression Wrap Profore Lite LF 3 Multilayer Compression Bandaging System Discharge Instruction: Apply 3 multi-layer wrap as prescribed. Compression Stockings Add-Ons Electronic Signature(s) Signed: 06/08/2021 3:07:45 PM By: Kalman Shan DO Entered By: Kalman Shan on 06/08/2021 15:06:12 [name] ([id number]) -------------------------------------------------------------------------------- Marvin Details Patient Name: [name]. Date of Service: 06/08/2021 2:30 PM Medical Record Number: [id number] Patient Account Number: [id number] Date of Birth/Sex: 1951-06-13 (70 y.o. F) Treating RN: Donnamarie Poag Primary Care Valeree Leidy: Rutherford Guys Other Clinician: Referring Ahmon Tosi: Rutherford Guys Treating Kazaria Gaertner/Extender: Yaakov Guthrie in Treatment: 10 Active Inactive Wound/Skin Impairment Nursing Diagnoses: Impaired tissue integrity Goals: Patient/caregiver will verbalize understanding of skin care regimen Date Initiated: 03/30/2021 Date Inactivated: 04/27/2021 Target Resolution Date: 03/30/2021 Goal Status: Met Ulcer/skin breakdown will have a volume reduction of 30% by week 4 Date Initiated: 03/30/2021 Date Inactivated: 05/11/2021 Target Resolution Date: 04/30/2021 Goal Status: Met Ulcer/skin breakdown will have a volume reduction of 50% by week 8 Date Initiated: 03/30/2021 Date Inactivated:  05/25/2021 Target Resolution Date: 05/30/2021 Goal Status: Met Ulcer/skin  breakdown will have a volume reduction of 80% by week 12 Date Initiated: 03/30/2021 Target Resolution Date: 06/30/2021 Goal Status: Active Ulcer/skin breakdown will heal within 14 weeks Date Initiated: 03/30/2021 Target Resolution Date: 07/31/2021 Goal Status: Active Interventions: Assess patient/caregiver ability to obtain necessary supplies Assess patient/caregiver ability to perform ulcer/skin care regimen upon admission and as needed Assess ulceration(s) every visit Treatment Activities: Patient referred to home care : 03/30/2021 Referred to DME Murlean Seelye for dressing supplies : 03/30/2021 Skin care regimen initiated : 03/30/2021 Notes: Electronic Signature(s) Signed: 06/08/2021 4:05:03 PM By: Donnamarie Poag Entered By: Donnamarie Poag on 06/08/2021 14:49:39 [name] ([id number]) -------------------------------------------------------------------------------- Pain Assessment Details Patient Name: [name]. Date of Service: 06/08/2021 2:30 PM Medical Record Number: [id number] Patient Account Number: [id number] Date of Birth/Sex: 01/02/1951 (70 y.o. F) Treating RN: Donnamarie Poag Primary Care Price Lachapelle: Rutherford Guys Other Clinician: Referring Sumaya Riedesel: Rutherford Guys Treating Torsten Weniger/Extender: Yaakov Guthrie in Treatment: 10 Active Problems Location of Pain Severity and Description of Pain Patient Has Paino No Site Locations Rate the pain. Current Pain Level: 0 Pain Management and Medication Current Pain Management: Electronic Signature(s) Signed: 06/08/2021 4:05:03 PM By: Donnamarie Poag Entered By: Donnamarie Poag on 06/08/2021 14:29:46 [name] ([id number]) -------------------------------------------------------------------------------- Patient/Caregiver Education Details Patient Name: [name]. Date of Service: 06/08/2021 2:30 PM Medical Record Number: [id number] Patient Account Number: [id number] Date of Birth/Gender: 06-20-1951 (70 y.o. F) Treating RN:  Donnamarie Poag Primary Care Physician: Rutherford Guys Other Clinician: Referring Physician: Rutherford Guys Treating Physician/Extender: Yaakov Guthrie in Treatment: 10 Education Assessment Education Provided To: Patient Education Topics Provided Wound/Skin Impairment: Electronic Signature(s) Signed: 06/08/2021 4:05:03 PM By: Donnamarie Poag Entered By: Donnamarie Poag on 06/08/2021 14:53:36 [name] ([id number]) -------------------------------------------------------------------------------- Wound Assessment Details Patient Name: [name]. Date of Service: 06/08/2021 2:30 PM Medical Record Number: [id number] Patient Account Number: [id number] Date of Birth/Sex: Nov 21, 1950 (70 y.o. F) Treating RN: Donnamarie Poag Primary Care Archibald Marchetta: Rutherford Guys Other Clinician: Referring Aulani Shipton: Rutherford Guys Treating Emrik Erhard/Extender: Yaakov Guthrie in Treatment: 10 Wound Status Wound Number: 10 Primary Venous Leg Ulcer Etiology: Wound Location: Right, Proximal, Anterior Lower Leg Wound Status: Open Wounding Event: Gradually Appeared Comorbid Cataracts, Arrhythmia, Hypertension, Type II Diabetes, Date Acquired: 06/08/2021 History: Osteoarthritis, Neuropathy Weeks Of Treatment: 0 Clustered Wound: No Photos Wound Measurements Length: (cm) 1.5 % Width: (cm) 0.5 % Depth: (cm) 0.1 Tu Area: (cm) 0.589 U Volume: (cm) 0.059 Reduction in Area: Reduction in Volume: nneling: No ndermining: No Wound Description Classification: Partial Thickness F Exudate Amount: Medium S Exudate Type: Serosanguineous Exudate Color: red, brown oul Odor After Cleansing: No lough/Fibrino Yes Wound Bed Granulation Amount: Large (67-100%) Exposed Structure Granulation Quality: Red, Pink Fascia Exposed: No Necrotic Amount: None Present (0%) Fat Layer (Subcutaneous Tissue) Exposed: No Tendon Exposed: No Muscle Exposed: No Joint Exposed: No Bone Exposed: No Limited to Skin  Breakdown Assessment Notes noted wrap slid down below appropriate level Treatment Notes Wound #10 (Lower Leg) Wound Laterality: Right, Anterior, Proximal Cleanser Wound Cleanser [name], [name] ([id number]) Discharge Instruction: Wash your hands with soap and water. Remove old dressing, discard into plastic bag and place into trash. Cleanse the wound with Wound Cleanser prior to applying a clean dressing using gauze sponges, not tissues or cotton balls. Do not scrub or use excessive force. Pat dry using gauze sponges, not tissue or cotton balls. Peri-Wound Care Topical Primary Dressing Silvercel Small 2x2 (in/in) Discharge Instruction: Apply Silvercel Small  2x2 (in/in) as instructed Secondary Dressing ABD Pad 5x9 (in/in) Discharge Instruction: Cover with ABD pad Secured With Compression Wrap Profore Lite LF 3 Multilayer Compression Bandaging System Discharge Instruction: Apply 3 multi-layer wrap as prescribed. Compression Stockings Add-Ons Electronic Signature(s) Signed: 06/08/2021 4:05:03 PM By: Donnamarie Poag Entered By: Donnamarie Poag on 06/08/2021 14:39:23 [name] ([id number]) -------------------------------------------------------------------------------- Wound Assessment Details Patient Name: [name]. Date of Service: 06/08/2021 2:30 PM Medical Record Number: [id number] Patient Account Number: [id number] Date of Birth/Sex: [birth year] (70 y.o. F) Treating RN: Donnamarie Poag Primary Care Fredia Chittenden: Rutherford Guys Other Clinician: Referring Aricela Bertagnolli: Rutherford Guys Treating Darald Uzzle/Extender: Yaakov Guthrie in Treatment: 10 Wound Status Wound Number: 3 Primary Diabetic Wound/Ulcer of the Lower Extremity Etiology: Wound Location: Left Toe Great Wound Status: Open Wounding Event: Gradually Appeared Comorbid Cataracts, Arrhythmia, Hypertension, Type II Diabetes, Date Acquired: 02/09/2021 History: Osteoarthritis, Neuropathy Weeks Of Treatment: 10 Clustered  Wound: No Photos Wound Measurements Length: (cm) 0.2 Width: (cm) 0.2 Depth: (cm) 0.1 Area: (cm) 0.031 Volume: (cm) 0.003 % Reduction in Area: 67% % Reduction in Volume: 66.7% Epithelialization: None Tunneling: No Undermining: No Wound Description Classification: Grade 1 Exudate Amount: Medium Exudate Type: Serosanguineous Exudate Color: red, brown Foul Odor After Cleansing: No Slough/Fibrino Yes Wound Bed Granulation Amount: None Present (0%) Exposed Structure Necrotic Amount: Large (67-100%) Fascia Exposed: No Necrotic Quality: Eschar Fat Layer (Subcutaneous Tissue) Exposed: No Tendon Exposed: No Muscle Exposed: No Joint Exposed: No Bone Exposed: No Treatment Notes Wound #3 (Toe Great) Wound Laterality: Left Cleanser Normal Saline Discharge Instruction: Wash your hands with soap and water. Remove old dressing, discard into plastic bag and place into trash. Cleanse the wound with Normal Saline prior to applying a clean dressing using gauze sponges, not tissues or cotton balls. Do not scrub or use excessive force. Pat dry using gauze sponges, not tissue or cotton balls. Soap and [address] [name], [name] ([id number]) Discharge Instruction: Gently cleanse wound with antibacterial soap, rinse and pat dry prior to dressing wounds Peri-Wound Care Topical Primary Dressing Hydrofera Blue Ready Transfer Foam, 2.5x2.5 (in/in) Discharge Instruction: Apply Hydrofera Blue Ready to wound bed as directed Secondary Dressing Gauze Secured With 57M Medipore H Soft Cloth Surgical Tape, 2x2 (in/yd) Compression Wrap Profore Lite LF 3 Multilayer Compression Bandaging System Discharge Instruction: Apply 3 multi-layer wrap as prescribed. Compression Stockings Circaid Juxta Lite Compression Wrap Quantity: 1 Left Leg Compression Amount: 20-30 mmHg Discharge Instruction: Apply Circaid Juxta Lite Compression Wrap as directed Add-Ons Electronic Signature(s) Signed: 06/08/2021 4:05:03 PM  By: Donnamarie Poag Entered By: Donnamarie Poag on 06/08/2021 14:40:09 [name] ([id number]) -------------------------------------------------------------------------------- Wound Assessment Details Patient Name: [name]. Date of Service: 06/08/2021 2:30 PM Medical Record Number: [id number] Patient Account Number: [id number] Date of Birth/Sex: Oct 19, 1950 (70 y.o. F) Treating RN: Donnamarie Poag Primary Care Deondra Labrador: Rutherford Guys Other Clinician: Referring Fredi Hurtado: Rutherford Guys Treating Ellias Mcelreath/Extender: Yaakov Guthrie in Treatment: 10 Wound Status Wound Number: 5 Primary Venous Leg Ulcer Etiology: Wound Location: Right, Medial Lower Leg Wound Status: Open Wounding Event: Gradually Appeared Comorbid Cataracts, Arrhythmia, Hypertension, Type II Diabetes, Date Acquired: 04/18/2021 History: Osteoarthritis, Neuropathy Weeks Of Treatment: 7 Clustered Wound: Yes Photos Wound Measurements Length: (cm) 1.7 Width: (cm) 1.5 Depth: (cm) 0.1 Area: (cm) 2.003 Volume: (cm) 0.2 % Reduction in Area: -183.3% % Reduction in Volume: -41.8% Epithelialization: None Tunneling: No Undermining: No Wound Description Classification: Full Thickness Without Exposed Support Structures Exudate Amount: Large Exudate Type: Serous Exudate Color: amber Foul Odor After Cleansing: No Slough/Fibrino Yes  Wound Bed Granulation Amount: Large (67-100%) Exposed Structure Granulation Quality: Red, Pink Fascia Exposed: No Necrotic Amount: Small (1-33%) Fat Layer (Subcutaneous Tissue) Exposed: Yes Necrotic Quality: Adherent Slough Tendon Exposed: No Muscle Exposed: No Joint Exposed: No Bone Exposed: No Treatment Notes Wound #5 (Lower Leg) Wound Laterality: Right, Medial Cleanser Wound Cleanser Discharge Instruction: Wash your hands with soap and water. Remove old dressing, discard into plastic bag and place into trash. Cleanse the wound with Wound Cleanser prior to applying a clean  dressing using gauze sponges, not tissues or cotton balls. Do not scrub or use excessive force. Pat dry using gauze sponges, not tissue or cotton balls. [name], [name] ([id number]) Peri-Wound Care Topical Primary Dressing Silvercel Small 2x2 (in/in) Discharge Instruction: Apply Silvercel Small 2x2 (in/in) as instructed Secondary Dressing ABD Pad 5x9 (in/in) Discharge Instruction: Cover with ABD pad Secured With Compression Wrap Profore Lite LF 3 Multilayer Compression Bandaging System Discharge Instruction: Apply 3 multi-layer wrap as prescribed. Compression Stockings Add-Ons Electronic Signature(s) Signed: 06/08/2021 4:05:03 PM By: Donnamarie Poag Entered By: Donnamarie Poag on 06/08/2021 14:40:55 [name] ([id number]) -------------------------------------------------------------------------------- Wound Assessment Details Patient Name: [name]. Date of Service: 06/08/2021 2:30 PM Medical Record Number: [id number] Patient Account Number: [id number] Date of Birth/Sex: 09/29/1950 (70 y.o. F) Treating RN: Donnamarie Poag Primary Care Aaylah Pokorny: Rutherford Guys Other Clinician: Referring Lucelia Lacey: Rutherford Guys Treating Anali Cabanilla/Extender: Yaakov Guthrie in Treatment: 10 Wound Status Wound Number: 9 Primary Venous Leg Ulcer Etiology: Wound Location: Right Lower Leg Wound Status: Open Wounding Event: Gradually Appeared Comorbid Cataracts, Arrhythmia, Hypertension, Type II Diabetes, Date Acquired: 06/01/2021 History: Osteoarthritis, Neuropathy Weeks Of Treatment: 1 Clustered Wound: No Photos Wound Measurements Length: (cm) 1.5 Width: (cm) 0.7 Depth: (cm) 0.1 Area: (cm) 0.825 Volume: (cm) 0.082 % Reduction in Area: 68.2% % Reduction in Volume: 68.3% Tunneling: No Undermining: No Wound Description Classification: Full Thickness Without Exposed Support Structures Exudate Amount: Medium Exudate Type: Serosanguineous Exudate Color: red, brown Foul Odor After  Cleansing: No Slough/Fibrino Yes Wound Bed Granulation Amount: Large (67-100%) Exposed Structure Granulation Quality: Red, Pink Fascia Exposed: No Necrotic Amount: Small (1-33%) Fat Layer (Subcutaneous Tissue) Exposed: Yes Necrotic Quality: Adherent Slough Tendon Exposed: No Muscle Exposed: No Joint Exposed: No Bone Exposed: No Treatment Notes Wound #9 (Lower Leg) Wound Laterality: Right Cleanser Wound Cleanser Discharge Instruction: Wash your hands with soap and water. Remove old dressing, discard into plastic bag and place into trash. Cleanse the wound with Wound Cleanser prior to applying a clean dressing using gauze sponges, not tissues or cotton balls. Do not scrub or use excessive force. Pat dry using gauze sponges, not tissue or cotton balls. [name], [name] ([id number]) Peri-Wound Care Topical Primary Dressing Silvercel Small 2x2 (in/in) Discharge Instruction: Apply Silvercel Small 2x2 (in/in) as instructed Secondary Dressing ABD Pad 5x9 (in/in) Discharge Instruction: Cover with ABD pad Secured With Compression Wrap Profore Lite LF 3 Multilayer Compression Bandaging System Discharge Instruction: Apply 3 multi-layer wrap as prescribed. Compression Stockings Add-Ons Electronic Signature(s) Signed: 06/08/2021 4:05:03 PM By: Donnamarie Poag Entered By: Donnamarie Poag on 06/08/2021 14:42:18 [name] ([id number]) -------------------------------------------------------------------------------- [place] Details Patient Name: [name]. Date of Service: 06/08/2021 2:30 PM Medical Record Number: [id number] Patient Account Number: [id number] Date of Birth/Sex: [birth year] (70 y.o. F) Treating RN: Donnamarie Poag Primary Care Kylyn Sookram: Rutherford Guys Other Clinician: Referring Yuna Pizzolato: Rutherford Guys Treating Rocko Fesperman/Extender: Yaakov Guthrie in Treatment: 10 Vital Signs Time Taken: 14:28 Temperature (F): 97.1 Height (in): 66 Pulse (bpm): 101 Weight (lbs):  332  Respiratory Rate (breaths/min): 18 Body Mass Index (BMI): 53.6 Blood Pressure (mmHg): 161/83 Reference Range: 80 - 120 mg / dl Electronic Signature(s) Signed: 06/08/2021 4:05:03 PM By: Donnamarie Poag Entered ByDonnamarie Poag on 06/08/2021 14:29:31

## 2021-06-08 NOTE — Progress Notes (Signed)
VICKIE, MELNIK ([id number]) Visit Report for 06/08/2021 Chief Complaint Document Details Patient Name: LANGSTON, SUMMERFIELD. Date of Service: 06/08/2021 2:30 PM Medical Record Number: [id number] Patient Account Number: [id number] Date of Birth/Sex: 1950/08/03 (70 y.o. F) Treating RN: Donnamarie Poag Primary Care Provider: Rutherford Guys Other Clinician: Referring Provider: Rutherford Guys Treating Provider/Extender: Yaakov Guthrie in Treatment: 10 Information Obtained from: Patient Chief Complaint Bilateral lower extremity wounds Electronic Signature(s) Signed: 06/08/2021 3:07:45 PM By: Kalman Shan DO Entered By: Kalman Shan on 06/08/2021 15:01:07 [name] ([id number]) -------------------------------------------------------------------------------- HPI Details Patient Name: [name]. Date of Service: 06/08/2021 2:30 PM Medical Record Number: [id number] Patient Account Number: [id number] Date of Birth/Sex: 31-Mar-1951 (70 y.o. F) Treating RN: Donnamarie Poag Primary Care Provider: Rutherford Guys Other Clinician: Referring Provider: Rutherford Guys Treating Provider/Extender: Yaakov Guthrie in Treatment: 10 History of Present Illness HPI Description: Admission 10/5 Ms. Raygan Skarda is a 70 year old female with a past medical history of hypertension, venous insufficiency, chronic diastolic heart failure and insulin-dependent type 2 diabetes that presents To the clinic for bilateral lower extremity wounds. She states that her right lower extremity developed a blister 1 week ago and she has been keeping the area covered with a bandaid. She states that when the Band-Aid came off it tore her skin. She subsequently developed pain, increased warmth and erythema to the right lower extremity. She denies purulent drainage. She has also developed an open wound to the left lower extremity that recently developed. She also has a small open wound to the proximal portion of the  left great toe. This has been present since August and was evaluated by podiatry. It has not healed and she has not followed up with podiatry for this issue. 10/12; patient presents for follow-up. She states she has 2 days left on her antibiotics. She is tolerated this well. She reports improvement to her right lower extremity symptoms. She denies signs of infection. She has home health. 10/26; patient presents for follow-up. She has no issues or complaints today. The compression wraps slid down to her shin. These were placed on Monday by home health. She has a couple of new open wounds due to this. She denies signs of infection. 11/2; patient presents for follow-up. The compression wraps had slid down to her shin again. The wraps taken off today do not appear to be appropriate material for 3 layer compression wrap. She currently denies signs of infection. 11/9; patient presents for follow-up. She was able to increase her diuretic over the past week. Again home health does not have appropriate materials for 3 layer compression. She currently denies signs of infection. 11/16; patient presents for follow-up. She has no issues or complaints today. She currently denies signs of infection. 11/30; patient presents for follow-up. The wrap continues to have a hard time staying in place. Overall she is doing well with no issues or complaints today. 12/7; patient presents for follow-up. She brought her juxta light compression today. She states however she cannot get these on daily. She has no issues or complaints today. 12/14; patient presents for follow-up. She states that the wrap rolled and created a new wound to her right lower extremity. Other than that she has no issues or complaints today. Electronic Signature(s) Signed: 06/08/2021 3:07:45 PM By: Kalman Shan DO Entered By: Kalman Shan on 06/08/2021 15:01:38 [name]  ([id number]) -------------------------------------------------------------------------------- Physical Exam Details Patient Name: [name]. Date of Service: 06/08/2021 2:30 PM Medical Record Number: [id number] Patient Account Number:  [id number] Date of Birth/Sex: 1950/09/07 (70 y.o. F) Treating RN: Donnamarie Poag Primary Care Provider: Rutherford Guys Other Clinician: Referring Provider: Rutherford Guys Treating Provider/Extender: Yaakov Guthrie in Treatment: 10 Constitutional . Cardiovascular . Psychiatric . Notes Patient has 3 wounds to her right lower extremity. These are all limited to skin breakdown and have granulation tissue present. She continues to have a scab to the left great toe. She has no open wounds to the left lower extremity. Electronic Signature(s) Signed: 06/08/2021 3:07:45 PM By: Kalman Shan DO Entered By: Kalman Shan on 06/08/2021 15:02:08 [name] ([id number]) -------------------------------------------------------------------------------- Physician Orders Details Patient Name: [name]. Date of Service: 06/08/2021 2:30 PM Medical Record Number: [id number] Patient Account Number: [id number] Date of Birth/Sex: [birth year] (70 y.o. F) Treating RN: Donnamarie Poag Primary Care Provider: Rutherford Guys Other Clinician: Referring Provider: Rutherford Guys Treating Provider/Extender: Yaakov Guthrie in Treatment: 10 Verbal / Phone Orders: No Diagnosis Coding Follow-up Appointments o Return Appointment in 1 week. o Nurse Visit as needed [place] for wound care. May utilize formulary equivalent dressing for wound treatment orders unless otherwise specified. Home Health Nurse may visit [facility] to address patientos wound care needs. - SEE additional order for OT eval and treat for deconditioning, unable to don socks and needs to wear Vecro compression therapy o Scheduled  days for dressing changes to be completed; exception, patient has scheduled wound care visit that day. o **Please direct any NON-WOUND related issues/requests for orders to patient's Primary Care Physician. **If current dressing causes regression in wound condition, may D/C ordered dressing product/s and apply Normal Saline Moist Dressing daily until next [place] or Other MD appointment. **Notify [facility] of regression in wound condition at [place] Bathing/ Shower/ Hygiene o May shower; gently cleanse wound with antibacterial soap, rinse and pat dry prior to dressing wounds o May shower with wound dressing protected with water repellent cover or cast protector. o No tub bath. Anesthetic (Use 'Patient Medications' Section for Anesthetic Order Entry) o Lidocaine applied to wound bed Edema Control - Lymphedema / Segmental Compressive Device / Other o Optional: One layer of unna paste to top of compression wrap (to act as an anchor). - please use or compression wrap will not stay up o Elevate leg(s) parallel to the floor when sitting. o DO YOUR BEST to sleep in the bed at night. DO NOT sleep in your recliner. Long hours of sitting in a recliner leads to swelling of the legs and/or potential wounds on your backside. Additional Orders / Instructions o Follow Nutritious Diet and Increase Protein Intake o Other: - OT EVAL AND TREAT FOR DECONDITIONING-UNABLE TO DON SOCKS/needs to be able to don velcro wraps Wound Treatment Wound #10 - Lower Leg Wound Laterality: Right, Anterior, Proximal Cleanser: Wound Cleanser 3 x Per Week/30 Days Discharge Instructions: Wash your hands with soap and water. Remove old dressing, discard into plastic bag and place into trash. Cleanse the wound with Wound Cleanser prior to applying a clean dressing using gauze sponges, not tissues or cotton balls. Do not scrub or use excessive force. Pat dry  using gauze sponges, not tissue or cotton balls. Primary Dressing: Silvercel Small 2x2 (in/in) 3 x Per Week/30 Days Discharge Instructions: Apply Silvercel Small 2x2 (in/in) as instructed Secondary Dressing: ABD Pad 5x9 (in/in) 3 x Per Week/30 Days Discharge Instructions: Cover with ABD pad Compression Wrap: Profore Lite LF 3 Multilayer  Compression Bandaging System (Generic) 3 x Per Week/30 Days Discharge Instructions: Apply 3 multi-layer wrap as prescribed. Wound #3 - Toe Great Wound Laterality: Left Cleanser: Normal Saline 3 x Per Week/30 Days Discharge Instructions: Wash your hands with soap and water. Remove old dressing, discard into plastic bag and place into trash. Cleanse the wound with Normal Saline prior to applying a clean dressing using gauze sponges, not tissues or cotton balls. Do not scrub or use excessive force. Pat dry using gauze sponges, not tissue or cotton balls. Cleanser: Soap and Water 3 x Per Week/30 Days HAYLEE, MCANANY ([id number]) Discharge Instructions: Gently cleanse wound with antibacterial soap, rinse and pat dry prior to dressing wounds Primary Dressing: Hydrofera Blue Ready Transfer Foam, 2.5x2.5 (in/in) 3 x Per Week/30 Days Discharge Instructions: Apply Hydrofera Blue Ready to wound bed as directed Secondary Dressing: Gauze 3 x Per Week/30 Days Secured With: 43M Medipore H Soft Cloth Surgical Tape, 2x2 (in/yd) 3 x Per Week/30 Days Compression Wrap: Profore Lite LF 3 Multilayer Compression Bandaging System 3 x Per Week/30 Days Discharge Instructions: Apply 3 multi-layer wrap as prescribed. Compression Stockings: Circaid Juxta Lite Compression Wrap Left Leg Compression Amount: 20-30 mmHG Discharge Instructions: Apply Circaid Juxta Lite Compression Wrap as directed Wound #5 - Lower Leg Wound Laterality: Right, Medial Cleanser: Wound Cleanser 3 x Per Week/30 Days Discharge Instructions: Wash your hands with soap and water. Remove old dressing, discard into  plastic bag and place into trash. Cleanse the wound with Wound Cleanser prior to applying a clean dressing using gauze sponges, not tissues or cotton balls. Do not scrub or use excessive force. Pat dry using gauze sponges, not tissue or cotton balls. Primary Dressing: Silvercel Small 2x2 (in/in) 3 x Per Week/30 Days Discharge Instructions: Apply Silvercel Small 2x2 (in/in) as instructed Secondary Dressing: ABD Pad 5x9 (in/in) 3 x Per Week/30 Days Discharge Instructions: Cover with ABD pad Compression Wrap: Profore Lite LF 3 Multilayer Compression Bandaging System (Generic) 3 x Per Week/30 Days Discharge Instructions: Apply 3 multi-layer wrap as prescribed. Wound #9 - Lower Leg Wound Laterality: Right Cleanser: Wound Cleanser 3 x Per Week/30 Days Discharge Instructions: Wash your hands with soap and water. Remove old dressing, discard into plastic bag and place into trash. Cleanse the wound with Wound Cleanser prior to applying a clean dressing using gauze sponges, not tissues or cotton balls. Do not scrub or use excessive force. Pat dry using gauze sponges, not tissue or cotton balls. Primary Dressing: Silvercel Small 2x2 (in/in) 3 x Per Week/30 Days Discharge Instructions: Apply Silvercel Small 2x2 (in/in) as instructed Secondary Dressing: ABD Pad 5x9 (in/in) 3 x Per Week/30 Days Discharge Instructions: Cover with ABD pad Compression Wrap: Profore Lite LF 3 Multilayer Compression Bandaging System (Generic) 3 x Per Week/30 Days Discharge Instructions: Apply 3 multi-layer wrap as prescribed. Electronic Signature(s) Signed: 06/08/2021 3:07:45 PM By: Kalman Shan DO Entered By: Kalman Shan on 06/08/2021 15:05:53 [name] ([id number]) -------------------------------------------------------------------------------- Problem List Details Patient Name: [name]. Date of Service: 06/08/2021 2:30 PM Medical Record Number: [id number] Patient Account Number: [id number] Date of  Birth/Sex: 1950/12/21 (70 y.o. F) Treating RN: Donnamarie Poag Primary Care Provider: Rutherford Guys Other Clinician: Referring Provider: Rutherford Guys Treating Provider/Extender: Yaakov Guthrie in Treatment: 10 Active Problems ICD-10 Encounter Code Description Active Date MDM Diagnosis L97.812 Non-pressure chronic ulcer of other part of right lower leg with fat layer 06/08/2021 No Yes exposed I87.331 Chronic venous hypertension (idiopathic) with ulcer and inflammation of 03/30/2021 No Yes  right lower extremity I87.332 Chronic venous hypertension (idiopathic) with ulcer and inflammation of 03/30/2021 No Yes left lower extremity S91.302D Unspecified open wound, left foot, subsequent encounter 03/30/2021 No Yes Inactive Problems Resolved Problems Electronic Signature(s) Signed: 06/08/2021 3:07:45 PM By: Kalman Shan DO Entered By: Kalman Shan on 06/08/2021 15:05:24 [name] ([id number]) -------------------------------------------------------------------------------- Progress Note Details Patient Name: [name]. Date of Service: 06/08/2021 2:30 PM Medical Record Number: [id number] Patient Account Number: [id number] Date of Birth/Sex: 1950/09/13 (70 y.o. F) Treating RN: Donnamarie Poag Primary Care Provider: Rutherford Guys Other Clinician: Referring Provider: Rutherford Guys Treating Provider/Extender: Yaakov Guthrie in Treatment: 10 Subjective Chief Complaint Information obtained from Patient Bilateral lower extremity wounds History of Present Illness (HPI) Admission 10/5 Ms. Zosia Lucchese is a 70 year old female with a past medical history of hypertension, venous insufficiency, chronic diastolic heart failure and insulin-dependent type 2 diabetes that presents To the clinic for bilateral lower extremity wounds. She states that her right lower extremity developed a blister 1 week ago and she has been keeping the area covered with a bandaid. She states that  when the Band-Aid came off it tore her skin. She subsequently developed pain, increased warmth and erythema to the right lower extremity. She denies purulent drainage. She has also developed an open wound to the left lower extremity that recently developed. She also has a small open wound to the proximal portion of the left great toe. This has been present since August and was evaluated by podiatry. It has not healed and she has not followed up with podiatry for this issue. 10/12; patient presents for follow-up. She states she has 2 days left on her antibiotics. She is tolerated this well. She reports improvement to her right lower extremity symptoms. She denies signs of infection. She has home health. 10/26; patient presents for follow-up. She has no issues or complaints today. The compression wraps slid down to her shin. These were placed on Monday by home health. She has a couple of new open wounds due to this. She denies signs of infection. 11/2; patient presents for follow-up. The compression wraps had slid down to her shin again. The wraps taken off today do not appear to be appropriate material for 3 layer compression wrap. She currently denies signs of infection. 11/9; patient presents for follow-up. She was able to increase her diuretic over the past week. Again home health does not have appropriate materials for 3 layer compression. She currently denies signs of infection. 11/16; patient presents for follow-up. She has no issues or complaints today. She currently denies signs of infection. 11/30; patient presents for follow-up. The wrap continues to have a hard time staying in place. Overall she is doing well with no issues or complaints today. 12/7; patient presents for follow-up. She brought her juxta light compression today. She states however she cannot get these on daily. She has no issues or complaints today. 12/14; patient presents for follow-up. She states that the wrap rolled and  created a new wound to her right lower extremity. Other than that she has no issues or complaints today. Patient History Information obtained from Patient. Family History Cancer - Maternal Grandparents,Paternal Grandparents,Mother,Father, Diabetes - Maternal Grandparents,Paternal Grandparents,Mother,Siblings, Heart Disease - Father,Siblings, Hypertension - Siblings,Father,Mother, No family history of Hereditary Spherocytosis, Kidney Disease, Lung Disease, Seizures, Stroke, Thyroid Problems, Tuberculosis. Social History Never smoker, Marital Status - Single, Alcohol Use - Never, Drug Use - No History, Caffeine Use - Never. Medical History Eyes Patient has history of Cataracts  Denies history of Glaucoma, Optic Neuritis Ear/Nose/Mouth/Throat Denies history of Chronic sinus problems/congestion, Middle ear problems Hematologic/Lymphatic Denies history of Anemia, Hemophilia, Human Immunodeficiency Virus, Lymphedema, Sickle Cell Disease Respiratory Denies history of Aspiration, Asthma, Chronic Obstructive Pulmonary Disease (COPD), Pneumothorax, Sleep Apnea, Tuberculosis Cardiovascular Patient has history of Arrhythmia - Afib, Hypertension Denies history of Angina, Congestive Heart Failure, Coronary Artery Disease, Deep Vein Thrombosis, Hypotension, Myocardial Infarction, Peripheral Arterial Disease, Peripheral Venous Disease, Phlebitis, Vasculitis Gastrointestinal Denies history of Cirrhosis , Colitis, Crohn s, Hepatitis A, Hepatitis B, Hepatitis C BETANIA, DIZON ([id number]) Endocrine Patient has history of Type II Diabetes Denies history of Type I Diabetes Genitourinary Denies history of End Stage Renal Disease Immunological Denies history of Lupus Erythematosus, Raynaud s, Scleroderma Integumentary (Skin) Denies history of History of Burn, History of pressure wounds Musculoskeletal Patient has history of Osteoarthritis Denies history of Gout, Rheumatoid Arthritis,  Osteomyelitis Neurologic Patient has history of Neuropathy - fingers and feet Denies history of Dementia, Quadriplegia, Paraplegia, Seizure Disorder Oncologic Denies history of Received Chemotherapy, Received Radiation Psychiatric Denies history of Anorexia/bulimia, Confinement Anxiety Medical And Surgical History Notes Cardiovascular "heart valve problem" Objective Constitutional Vitals Time Taken: 2:28 PM, Height: 66 in, Weight: 332 lbs, BMI: 53.6, Temperature: 97.1 F, Pulse: 101 bpm, Respiratory Rate: 18 breaths/min, Blood Pressure: 161/83 mmHg. General Notes: Patient has 3 wounds to her right lower extremity. These are all limited to skin breakdown and have granulation tissue present. She continues to have a scab to the left great toe. She has no open wounds to the left lower extremity. Integumentary (Hair, Skin) Wound #10 status is Open. Original cause of wound was Gradually Appeared. The date acquired was: 06/08/2021. The wound is located on the Right,Proximal,Anterior Lower Leg. The wound measures 1.5cm length x 0.5cm width x 0.1cm depth; 0.589cm^2 area and 0.059cm^3 volume. The wound is limited to skin breakdown. There is no tunneling or undermining noted. There is a medium amount of serosanguineous drainage noted. There is large (67-100%) red, pink granulation within the wound bed. There is no necrotic tissue within the wound bed. General Notes: noted wrap slid down below appropriate level Wound #3 status is Open. Original cause of wound was Gradually Appeared. The date acquired was: 02/09/2021. The wound has been in treatment 10 weeks. The wound is located on the Left Toe Great. The wound measures 0.2cm length x 0.2cm width x 0.1cm depth; 0.031cm^2 area and 0.003cm^3 volume. There is no tunneling or undermining noted. There is a medium amount of serosanguineous drainage noted. There is no granulation within the wound bed. There is a large (67-100%) amount of necrotic tissue  within the wound bed including Eschar. Wound #5 status is Open. Original cause of wound was Gradually Appeared. The date acquired was: 04/18/2021. The wound has been in treatment 7 weeks. The wound is located on the Right,Medial Lower Leg. The wound measures 1.7cm length x 1.5cm width x 0.1cm depth; 2.003cm^2 area and 0.2cm^3 volume. There is Fat Layer (Subcutaneous Tissue) exposed. There is no tunneling or undermining noted. There is a large amount of serous drainage noted. There is large (67-100%) red, pink granulation within the wound bed. There is a small (1-33%) amount of necrotic tissue within the wound bed including Adherent Slough. Wound #9 status is Open. Original cause of wound was Gradually Appeared. The date acquired was: 06/01/2021. The wound has been in treatment 1 weeks. The wound is located on the Right Lower Leg. The wound measures 1.5cm length x 0.7cm width x 0.1cm depth;  0.825cm^2 area and 0.082cm^3 volume. There is Fat Layer (Subcutaneous Tissue) exposed. There is no tunneling or undermining noted. There is a medium amount of serosanguineous drainage noted. There is large (67-100%) red, pink granulation within the wound bed. There is a small (1-33%) amount of necrotic tissue within the wound bed including Adherent Slough. Assessment Active Problems ICD-10 LEMMA, TETRO ([id number]) Non-pressure chronic ulcer of other part of right lower leg with fat layer exposed Chronic venous hypertension (idiopathic) with ulcer and inflammation of right lower extremity Chronic venous hypertension (idiopathic) with ulcer and inflammation of left lower extremity Unspecified open wound, left foot, subsequent encounter Patient's wounds are stable. She has 1 new wound to her right lower extremity Because the wrap did not stay in place. I recommended continuing silver alginate to the wound beds along with 3 layer compression. She has no open wounds to her left lower extremity however she cannot  physically put compression stockings on or velcor wraps. We will continue to wrap this leg. She continues to have a scab to the left great toe. Nothing to do here. No signs of infection on exam. Follow-up in 1 week Procedures Wound #3 Pre-procedure diagnosis of Wound #3 is a Diabetic Wound/Ulcer of the Lower Extremity located on the Left Toe Great . There was a Three Layer Compression Therapy Procedure by Donnamarie Poag, RN. Post procedure Diagnosis Wound #3: Same as Pre-Procedure Wound #5 Pre-procedure diagnosis of Wound #5 is a Venous Leg Ulcer located on the Right,Medial Lower Leg . There was a Three Layer Compression Therapy Procedure by Donnamarie Poag, RN. Post procedure Diagnosis Wound #5: Same as Pre-Procedure Plan Follow-up Appointments: Return Appointment in 1 week. Nurse Visit as needed Home Health: [facility]: - [facility] for wound care. May utilize formulary equivalent dressing for wound treatment orders unless otherwise specified. Home Health Nurse may visit [facility] to address patient s wound care needs. - SEE additional order for OT eval and treat for deconditioning, unable to don socks and needs to wear Vecro compression therapy Scheduled days for dressing changes to be completed; exception, patient has scheduled wound care visit that day. **Please direct any NON-WOUND related issues/requests for orders to patient's Primary Care Physician. **If current dressing causes regression in wound condition, may D/C ordered dressing product/s and apply Normal Saline Moist Dressing daily until next [place] or Other MD appointment. **Notify Wound [facility]aling Center of regression in wound condition at [place] Bathing/ Shower/ Hygiene: May shower; gently cleanse wound with antibacterial soap, rinse and pat dry prior to dressing wounds May shower with wound dressing protected with water repellent cover or cast protector. No tub  bath. Anesthetic (Use 'Patient Medications' Section for Anesthetic Order Entry): Lidocaine applied to wound bed Edema Control - Lymphedema / Segmental Compressive Device / Other: Optional: One layer of unna paste to top of compression wrap (to act as an anchor). - please use or compression wrap will not stay up Elevate leg(s) parallel to the floor when sitting. DO YOUR BEST to sleep in the bed at night. DO NOT sleep in your recliner. Long hours of sitting in a recliner leads to swelling of the legs and/or potential wounds on your backside. Additional Orders / Instructions: Follow Nutritious Diet and Increase Protein Intake Other: - OT EVAL AND TREAT FOR DECONDITIONING-UNABLE TO DON SOCKS/needs to be able to don velcro wraps WOUND #10: - Lower Leg Wound Laterality: Right, Anterior, Proximal Cleanser: Wound Cleanser 3 x Per  Week/30 Days Discharge Instructions: Wash your hands with soap and water. Remove old dressing, discard into plastic bag and place into trash. Cleanse the wound with Wound Cleanser prior to applying a clean dressing using gauze sponges, not tissues or cotton balls. Do not scrub or use excessive force. Pat dry using gauze sponges, not tissue or cotton balls. Primary Dressing: Silvercel Small 2x2 (in/in) 3 x Per Week/30 Days Discharge Instructions: Apply Silvercel Small 2x2 (in/in) as instructed Secondary Dressing: ABD Pad 5x9 (in/in) 3 x Per Week/30 Days Discharge Instructions: Cover with ABD pad Compression Wrap: Profore Lite LF 3 Multilayer Compression Bandaging System (Generic) 3 x Per Week/30 Days Discharge Instructions: Apply 3 multi-layer wrap as prescribed. WOUND #3: - Toe Great Wound Laterality: Left Cleanser: Normal Saline 3 x Per Week/30 Days Discharge Instructions: Wash your hands with soap and water. Remove old dressing, discard into plastic bag and place into trash. Cleanse the wound with Normal Saline prior to applying a clean dressing using gauze sponges, not  tissues or cotton balls. Do not scrub or use AMILEE, JANVIER. ([id number]) excessive force. Pat dry using gauze sponges, not tissue or cotton balls. Cleanser: Soap and Water 3 x Per Week/30 Days Discharge Instructions: Gently cleanse wound with antibacterial soap, rinse and pat dry prior to dressing wounds Primary Dressing: Hydrofera Blue Ready Transfer Foam, 2.5x2.5 (in/in) 3 x Per Week/30 Days Discharge Instructions: Apply Hydrofera Blue Ready to wound bed as directed Secondary Dressing: Gauze 3 x Per Week/30 Days Secured With: 1M Medipore H Soft Cloth Surgical Tape, 2x2 (in/yd) 3 x Per Week/30 Days Compression Wrap: Profore Lite LF 3 Multilayer Compression Bandaging System 3 x Per Week/30 Days Discharge Instructions: Apply 3 multi-layer wrap as prescribed. Compression Stockings: Circaid Juxta Lite Compression Wrap Compression Amount: 20-30 mmHg (left) Discharge Instructions: Apply Circaid Juxta Lite Compression Wrap as directed WOUND #5: - Lower Leg Wound Laterality: Right, Medial Cleanser: Wound Cleanser 3 x Per Week/30 Days Discharge Instructions: Wash your hands with soap and water. Remove old dressing, discard into plastic bag and place into trash. Cleanse the wound with Wound Cleanser prior to applying a clean dressing using gauze sponges, not tissues or cotton balls. Do not scrub or use excessive force. Pat dry using gauze sponges, not tissue or cotton balls. Primary Dressing: Silvercel Small 2x2 (in/in) 3 x Per Week/30 Days Discharge Instructions: Apply Silvercel Small 2x2 (in/in) as instructed Secondary Dressing: ABD Pad 5x9 (in/in) 3 x Per Week/30 Days Discharge Instructions: Cover with ABD pad Compression Wrap: Profore Lite LF 3 Multilayer Compression Bandaging System (Generic) 3 x Per Week/30 Days Discharge Instructions: Apply 3 multi-layer wrap as prescribed. WOUND #9: - Lower Leg Wound Laterality: Right Cleanser: Wound Cleanser 3 x Per Week/30 Days Discharge Instructions:  Wash your hands with soap and water. Remove old dressing, discard into plastic bag and place into trash. Cleanse the wound with Wound Cleanser prior to applying a clean dressing using gauze sponges, not tissues or cotton balls. Do not scrub or use excessive force. Pat dry using gauze sponges, not tissue or cotton balls. Primary Dressing: Silvercel Small 2x2 (in/in) 3 x Per Week/30 Days Discharge Instructions: Apply Silvercel Small 2x2 (in/in) as instructed Secondary Dressing: ABD Pad 5x9 (in/in) 3 x Per Week/30 Days Discharge Instructions: Cover with ABD pad Compression Wrap: Profore Lite LF 3 Multilayer Compression Bandaging System (Generic) 3 x Per Week/30 Days Discharge Instructions: Apply 3 multi-layer wrap as prescribed. 1. Silver alginate under 3 layer compression 2. Follow-up in 1 week Electronic  Signature(s) Signed: 06/08/2021 3:07:45 PM By: Kalman Shan DO Entered By: Kalman Shan on 06/08/2021 15:07:29 [name] ([id number]) -------------------------------------------------------------------------------- ROS/PFSH Details Patient Name: [name]. Date of Service: 06/08/2021 2:30 PM Medical Record Number: [id number] Patient Account Number: [id number] Date of Birth/Sex: 1951/04/13 (70 y.o. F) Treating RN: Donnamarie Poag Primary Care Provider: Rutherford Guys Other Clinician: Referring Provider: Rutherford Guys Treating Provider/Extender: Yaakov Guthrie in Treatment: 10 Information Obtained From Patient Eyes Medical History: Positive for: Cataracts Negative for: Glaucoma; Optic Neuritis Ear/Nose/Mouth/Throat Medical History: Negative for: Chronic sinus problems/congestion; Middle ear problems Hematologic/Lymphatic Medical History: Negative for: Anemia; Hemophilia; Human Immunodeficiency Virus; Lymphedema; Sickle Cell Disease Respiratory Medical History: Negative for: Aspiration; Asthma; Chronic Obstructive Pulmonary Disease (COPD); Pneumothorax; Sleep  Apnea; Tuberculosis Cardiovascular Medical History: Positive for: Arrhythmia - Afib; Hypertension Negative for: Angina; Congestive Heart Failure; Coronary Artery Disease; Deep Vein Thrombosis; Hypotension; Myocardial Infarction; Peripheral Arterial Disease; Peripheral Venous Disease; Phlebitis; Vasculitis Past Medical History Notes: "heart valve problem" Gastrointestinal Medical History: Negative for: Cirrhosis ; Colitis; Crohnos; Hepatitis A; Hepatitis B; Hepatitis C Endocrine Medical History: Positive for: Type II Diabetes Negative for: Type I Diabetes Time with diabetes: since 2007 Treated with: Insulin, Oral agents Blood sugar tested every day: Yes Tested : daily Genitourinary Medical History: Negative for: End Stage Renal Disease Immunological Medical History: Negative for: Lupus Erythematosus; Raynaudos; Scleroderma Integumentary (Skin) THOMAS, RHUDE ([id number]) Medical History: Negative for: History of Burn; History of pressure wounds Musculoskeletal Medical History: Positive for: Osteoarthritis Negative for: Gout; Rheumatoid Arthritis; Osteomyelitis Neurologic Medical History: Positive for: Neuropathy - fingers and feet Negative for: Dementia; Quadriplegia; Paraplegia; Seizure Disorder Oncologic Medical History: Negative for: Received Chemotherapy; Received Radiation Psychiatric Medical History: Negative for: Anorexia/bulimia; Confinement Anxiety HBO Extended History Items Eyes: Cataracts Immunizations Pneumococcal Vaccine: Received Pneumococcal Vaccination: Yes Received Pneumococcal Vaccination On or After 60th Birthday: No Implantable Devices Yes Family and Social History Cancer: Yes - Maternal Grandparents,Paternal Grandparents,Mother,Father; Diabetes: Yes - Maternal Grandparents,Paternal Grandparents,Mother,Siblings; Heart Disease: Yes - Father,Siblings; Hereditary Spherocytosis: No; Hypertension: Yes - Siblings,Father,Mother; Kidney Disease: No;  Lung Disease: No; Seizures: No; Stroke: No; Thyroid Problems: No; Tuberculosis: No; Never smoker; Marital Status - Single; Alcohol Use: Never; Drug Use: No History; Caffeine Use: Never; Financial Concerns: Yes; Food, Clothing or Shelter Needs: No; Support System Lacking: No; Transportation Concerns: No Electronic Signature(s) Signed: 06/08/2021 3:07:45 PM By: Kalman Shan DO Signed: 06/08/2021 4:05:03 PM By: Donnamarie Poag Entered By: Kalman Shan on 06/08/2021 15:06:03 [name] ([id number]) -------------------------------------------------------------------------------- [place] Details Patient Name: [name]. Date of Service: 06/08/2021 Medical Record Number: [id number] Patient Account Number: [id number] Date of Birth/Sex: 12/21/1950 (70 y.o. F) Treating RN: Donnamarie Poag Primary Care Provider: Rutherford Guys Other Clinician: Referring Provider: Rutherford Guys Treating Provider/Extender: Yaakov Guthrie in Treatment: 10 Diagnosis Coding ICD-10 Codes Code Description [phone] Chronic venous hypertension (idiopathic) with ulcer and inflammation of right lower extremity I87.332 Chronic venous hypertension (idiopathic) with ulcer and inflammation of left lower extremity S91.302D Unspecified open wound, left foot, subsequent encounter Facility Procedures CPT4: Description Modifier Quantity Code [id number] BILATERAL: Application of multi-layer venous compression system; leg (below knee), including 1 ankle and foot. Physician Procedures CPT4 Code Description: [id number] - WC PHYS LEVEL 3 - EST PT Modifier: Quantity: 1 CPT4 Code Description: ICD-10 Diagnosis Description I87.331 Chronic venous hypertension (idiopathic) with ulcer and inflammation of Modifier: right lower extremit Quantity: y Engineer, maintenance) Signed: 06/08/2021 3:07:45 PM By: Kalman Shan DO Entered By: Kalman Shan on 06/08/2021 15:04:23

## 2021-06-10 ENCOUNTER — Encounter (INDEPENDENT_AMBULATORY_CARE_PROVIDER_SITE_OTHER): Payer: Medicare Other

## 2021-06-15 ENCOUNTER — Other Ambulatory Visit: Payer: Self-pay

## 2021-06-15 ENCOUNTER — Encounter (HOSPITAL_BASED_OUTPATIENT_CLINIC_OR_DEPARTMENT_OTHER): Payer: Medicare Other | Admitting: Internal Medicine

## 2021-06-15 DIAGNOSIS — I87333 Chronic venous hypertension (idiopathic) with ulcer and inflammation of bilateral lower extremity: Secondary | ICD-10-CM | POA: Diagnosis not present

## 2021-06-15 DIAGNOSIS — I87331 Chronic venous hypertension (idiopathic) with ulcer and inflammation of right lower extremity: Secondary | ICD-10-CM | POA: Diagnosis not present

## 2021-06-15 DIAGNOSIS — I87332 Chronic venous hypertension (idiopathic) with ulcer and inflammation of left lower extremity: Secondary | ICD-10-CM

## 2021-06-15 DIAGNOSIS — S91302D Unspecified open wound, left foot, subsequent encounter: Secondary | ICD-10-CM

## 2021-06-15 NOTE — Progress Notes (Signed)
[name], [name] ([id number]) Visit Report for 06/15/2021 Chief Complaint Document Details Patient Name: [name], TANKARD. Date of Service: 06/15/2021 2:30 PM Medical Record Number: [id number] Patient Account Number: [id number] Date of Birth/Sex: [birth year] (70 y.o. F) Treating RN: Donnamarie Poag Primary Care Provider: Rutherford Guys Other Clinician: Referring Provider: Rutherford Guys Treating Provider/Extender: Yaakov Guthrie in Treatment: 11 Information Obtained from: Patient Chief Complaint Bilateral lower extremity wounds Electronic Signature(s) Signed: 06/15/2021 4:05:13 PM By: Kalman Shan DO Entered By: Kalman Shan on 06/15/2021 16:00:02 [name] ([id number]) -------------------------------------------------------------------------------- HPI Details Patient Name: [name]. Date of Service: 06/15/2021 2:30 PM Medical Record Number: [id number] Patient Account Number: [id number] Date of Birth/Sex: [birth year] (70 y.o. F) Treating RN: Donnamarie Poag Primary Care Provider: Rutherford Guys Other Clinician: Referring Provider: Rutherford Guys Treating Provider/Extender: Yaakov Guthrie in Treatment: 11 History of Present Illness HPI Description: Admission 10/5 [name] is a 70 year old female with a past medical history of hypertension, venous insufficiency, chronic diastolic heart failure and insulin-dependent type 2 diabetes that presents To the clinic for bilateral lower extremity wounds. She states that her right lower extremity developed a blister 1 week ago and she has been keeping the area covered with a bandaid. She states that when the Band-Aid came off it tore her skin. She subsequently developed pain, increased warmth and erythema to the right lower extremity. She denies purulent drainage. She has also developed an open wound to the left lower extremity that recently developed. She also has a small open wound to the proximal portion of the  left great toe. This has been present since August and was evaluated by podiatry. It has not healed and she has not followed up with podiatry for this issue. 10/12; patient presents for follow-up. She states she has 2 days left on her antibiotics. She is tolerated this well. She reports improvement to her right lower extremity symptoms. She denies signs of infection. She has home health. 10/26; patient presents for follow-up. She has no issues or complaints today. The compression wraps slid down to her shin. These were placed on Monday by home health. She has a couple of new open wounds due to this. She denies signs of infection. 11/2; patient presents for follow-up. The compression wraps had slid down to her shin again. The wraps taken off today do not appear to be appropriate material for 3 layer compression wrap. She currently denies signs of infection. 11/9; patient presents for follow-up. She was able to increase her diuretic over the past week. Again home health does not have appropriate materials for 3 layer compression. She currently denies signs of infection. 11/16; patient presents for follow-up. She has no issues or complaints today. She currently denies signs of infection. 11/30; patient presents for follow-up. The wrap continues to have a hard time staying in place. Overall she is doing well with no issues or complaints today. 12/7; patient presents for follow-up. She brought her juxta light compression today. She states however she cannot get these on daily. She has no issues or complaints today. 12/14; patient presents for follow-up. She states that the wrap rolled and created a new wound to her right lower extremity. Other than that she has no issues or complaints today. 12/21; patient presents for follow-up. The compression wraps continue to have a hard time staying in place. They again have rolled down her leg. Electronic Signature(s) Signed: 06/15/2021 4:05:13 PM By: Kalman Shan DO Entered By: Kalman Shan on 06/15/2021 16:01:30 Bernita Raisin  Wanda Plump ([id number]) -------------------------------------------------------------------------------- Physical Exam Details Patient Name: [name], [name]. Date of Service: 06/15/2021 2:30 PM Medical Record Number: [id number] Patient Account Number: [id number] Date of Birth/Sex: Oct 31, 1950 (70 y.o. F) Treating RN: Donnamarie Poag Primary Care Provider: Rutherford Guys Other Clinician: Referring Provider: Rutherford Guys Treating Provider/Extender: Yaakov Guthrie in Treatment: 11 Constitutional . Cardiovascular . Psychiatric . Notes Patient has scattered wounds limited to skin breakdown to her right lower extremity. The previous wound to the left great toe has epithelialized. There is no scab present. There are no open wounds to the left lower extremity. Electronic Signature(s) Signed: 06/15/2021 4:05:13 PM By: Kalman Shan DO Entered By: Kalman Shan on 06/15/2021 16:02:08 [name] ([id number]) -------------------------------------------------------------------------------- Physician Orders Details Patient Name: [name]. Date of Service: 06/15/2021 2:30 PM Medical Record Number: [id number] Patient Account Number: [id number] Date of Birth/Sex: 1950/07/04 (70 y.o. F) Treating RN: Donnamarie Poag Primary Care Provider: Rutherford Guys Other Clinician: Referring Provider: Rutherford Guys Treating Provider/Extender: Yaakov Guthrie in Treatment: 47 Verbal / Phone Orders: No Diagnosis Coding Follow-up Appointments o Return Appointment in 1 week. o Nurse Visit as needed [place] for wound care. May utilize formulary equivalent dressing for wound treatment orders unless otherwise specified. Home Health Nurse may visit PRN to address patientos wound care needs. - SEE additional order for OT eval and treat for deconditioning,  unable to don socks and needs to wear Vecro compression therapy o Scheduled days for dressing changes to be completed; exception, patient has scheduled wound care visit that day. o **Please direct any NON-WOUND related issues/requests for orders to patient's Primary Care Physician. **If current dressing causes regression in wound condition, may D/C ordered dressing product/s and apply Normal Saline Moist Dressing daily until next [place] or Other MD appointment. **Notify [facility] of regression in wound condition at [place] Bathing/ Shower/ Hygiene o May shower; gently cleanse wound with antibacterial soap, rinse and pat dry prior to dressing wounds o May shower with wound dressing protected with water repellent cover or cast protector. o No tub bath. Anesthetic (Use 'Patient Medications' Section for Anesthetic Order Entry) o Lidocaine applied to wound bed Edema Control - Lymphedema / Segmental Compressive Device / Other o Optional: One layer of unna paste to top of compression wrap (to act as an anchor). - please use or compression wrap will not stay up o Elevate leg(s) parallel to the floor when sitting. o DO YOUR BEST to sleep in the bed at night. DO NOT sleep in your recliner. Long hours of sitting in a recliner leads to swelling of the legs and/or potential wounds on your backside. o Other: - Kerlix and Coban left leg as well-wrap base of toes to behind knee-same as other leg bilateral wrap lower compression Additional Orders / Instructions o Follow Nutritious Diet and Increase Protein Intake o Other: - OT EVAL AND TREAT FOR DECONDITIONING-UNABLE TO DON SOCKS/needs to be able to don velcro wraps Wound Treatment Wound #10 - Lower Leg Wound Laterality: Right, Anterior, Proximal Cleanser: Wound Cleanser 3 x Per Week/30 Days Discharge Instructions: Wash your hands with soap and water. Remove old dressing, discard  into plastic bag and place into trash. Cleanse the wound with Wound Cleanser prior to applying a clean dressing using gauze sponges, not tissues or cotton balls. Do not scrub or use excessive force. Pat dry using gauze sponges, not tissue or cotton balls. Primary  Dressing: Silvercel Small 2x2 (in/in) 3 x Per Week/30 Days Discharge Instructions: Apply Silvercel Small 2x2 (in/in) as instructed Secondary Dressing: ABD Pad 5x9 (in/in) 3 x Per Week/30 Days Discharge Instructions: Cover with ABD pad Secured With: Coban Cohesive Bandage 4x5 (yds) Stretched 3 x Per Week/30 Days Discharge Instructions: Apply Coban as directed. Secured With: [employer] or Non-Sterile 6-ply 4.5x4 (yd/yd) 3 x Per Week/30 Days Discharge Instructions: Apply Kerlix as directed Wound #11 - Lower Leg Wound Laterality: Right Cleanser: Wound Cleanser 3 x Per Week/30 Days SHERETHA, SHADD ([id number]) Discharge Instructions: Wash your hands with soap and water. Remove old dressing, discard into plastic bag and place into trash. Cleanse the wound with Wound Cleanser prior to applying a clean dressing using gauze sponges, not tissues or cotton balls. Do not scrub or use excessive force. Pat dry using gauze sponges, not tissue or cotton balls. Primary Dressing: Silvercel Small 2x2 (in/in) 3 x Per Week/30 Days Discharge Instructions: Apply Silvercel Small 2x2 (in/in) as instructed Secondary Dressing: ABD Pad 5x9 (in/in) 3 x Per Week/30 Days Discharge Instructions: Cover with ABD pad Secured With: Coban Cohesive Bandage 4x5 (yds) Stretched 3 x Per Week/30 Days Discharge Instructions: Apply Coban as directed. Secured With: [employer] or Non-Sterile 6-ply 4.5x4 (yd/yd) 3 x Per Week/30 Days Discharge Instructions: Apply Kerlix as directed Wound #9 - Lower Leg Wound Laterality: Right Cleanser: Wound Cleanser 3 x Per Week/30 Days Discharge Instructions: Wash your hands with soap and water. Remove old dressing, discard  into plastic bag and place into trash. Cleanse the wound with Wound Cleanser prior to applying a clean dressing using gauze sponges, not tissues or cotton balls. Do not scrub or use excessive force. Pat dry using gauze sponges, not tissue or cotton balls. Primary Dressing: Silvercel Small 2x2 (in/in) 3 x Per Week/30 Days Discharge Instructions: Apply Silvercel Small 2x2 (in/in) as instructed Secondary Dressing: ABD Pad 5x9 (in/in) 3 x Per Week/30 Days Discharge Instructions: Cover with ABD pad Secured With: Coban Cohesive Bandage 4x5 (yds) Stretched 3 x Per Week/30 Days Discharge Instructions: Apply Coban as directed. Secured With: [employer] or Non-Sterile 6-ply 4.5x4 (yd/yd) 3 x Per Week/30 Days Discharge Instructions: Apply Kerlix as directed Electronic Signature(s) Signed: 06/15/2021 4:04:38 PM By: Donnamarie Poag Signed: 06/15/2021 4:05:13 PM By: Kalman Shan DO Entered By: Donnamarie Poag on 06/15/2021 15:23:58 [name] ([id number]) -------------------------------------------------------------------------------- Problem List Details Patient Name: GARNETT, REKOWSKI. Date of Service: 06/15/2021 2:30 PM Medical Record Number: [id number] Patient Account Number: [id number] Date of Birth/Sex: 1950/07/20 (70 y.o. F) Treating RN: Donnamarie Poag Primary Care Provider: Rutherford Guys Other Clinician: Referring Provider: Rutherford Guys Treating Provider/Extender: Yaakov Guthrie in Treatment: 11 Active Problems ICD-10 Encounter Code Description Active Date MDM Diagnosis L97.812 Non-pressure chronic ulcer of other part of right lower leg with fat layer 06/08/2021 No Yes exposed I87.331 Chronic venous hypertension (idiopathic) with ulcer and inflammation of 03/30/2021 No Yes right lower extremity I87.332 Chronic venous hypertension (idiopathic) with ulcer and inflammation of 03/30/2021 No Yes left lower extremity S91.302D Unspecified open wound, left foot, subsequent encounter  03/30/2021 No Yes Inactive Problems Resolved Problems Electronic Signature(s) Signed: 06/15/2021 4:05:13 PM By: Kalman Shan DO Entered By: Kalman Shan on 06/15/2021 15:59:55 [name] ([id number]) -------------------------------------------------------------------------------- Progress Note Details Patient Name: [name]. Date of Service: 06/15/2021 2:30 PM Medical Record Number: [id number] Patient Account Number: [id number] Date of Birth/Sex: 06-Feb-1951 (70 y.o. F) Treating RN: Donnamarie Poag Primary Care Provider: Rutherford Guys Other Clinician:  Referring Provider: Rutherford Guys Treating Provider/Extender: Yaakov Guthrie in Treatment: 11 Subjective Chief Complaint Information obtained from Patient Bilateral lower extremity wounds History of Present Illness (HPI) Admission 10/5 Ms. Lyzette Reinhardt is a 70 year old female with a past medical history of hypertension, venous insufficiency, chronic diastolic heart failure and insulin-dependent type 2 diabetes that presents To the clinic for bilateral lower extremity wounds. She states that her right lower extremity developed a blister 1 week ago and she has been keeping the area covered with a bandaid. She states that when the Band-Aid came off it tore her skin. She subsequently developed pain, increased warmth and erythema to the right lower extremity. She denies purulent drainage. She has also developed an open wound to the left lower extremity that recently developed. She also has a small open wound to the proximal portion of the left great toe. This has been present since August and was evaluated by podiatry. It has not healed and she has not followed up with podiatry for this issue. 10/12; patient presents for follow-up. She states she has 2 days left on her antibiotics. She is tolerated this well. She reports improvement to her right lower extremity symptoms. She denies signs of infection. She has home  health. 10/26; patient presents for follow-up. She has no issues or complaints today. The compression wraps slid down to her shin. These were placed on Monday by home health. She has a couple of new open wounds due to this. She denies signs of infection. 11/2; patient presents for follow-up. The compression wraps had slid down to her shin again. The wraps taken off today do not appear to be appropriate material for 3 layer compression wrap. She currently denies signs of infection. 11/9; patient presents for follow-up. She was able to increase her diuretic over the past week. Again home health does not have appropriate materials for 3 layer compression. She currently denies signs of infection. 11/16; patient presents for follow-up. She has no issues or complaints today. She currently denies signs of infection. 11/30; patient presents for follow-up. The wrap continues to have a hard time staying in place. Overall she is doing well with no issues or complaints today. 12/7; patient presents for follow-up. She brought her juxta light compression today. She states however she cannot get these on daily. She has no issues or complaints today. 12/14; patient presents for follow-up. She states that the wrap rolled and created a new wound to her right lower extremity. Other than that she has no issues or complaints today. 12/21; patient presents for follow-up. The compression wraps continue to have a hard time staying in place. They again have rolled down her leg. Objective Constitutional Vitals Time Taken: 2:57 PM, Height: 66 in, Weight: 332 lbs, BMI: 53.6, Temperature: 98.4 F, Pulse: 101 bpm, Respiratory Rate: 20 breaths/min, Blood Pressure: 157/77 mmHg. General Notes: Patient has scattered wounds limited to skin breakdown to her right lower extremity. The previous wound to the left great toe has epithelialized. There is no scab present. There are no open wounds to the left lower  extremity. Integumentary (Hair, Skin) Wound #10 status is Open. Original cause of wound was Gradually Appeared. The date acquired was: 06/08/2021. The wound has been in treatment 1 weeks. The wound is located on the Right,Proximal,Anterior Lower Leg. The wound measures 2.5cm length x 1cm width x 0.1cm depth; 1.963cm^2 area and 0.196cm^3 volume. The wound is limited to skin breakdown. There is no tunneling or undermining noted. There is a medium amount  of serosanguineous drainage noted. There is medium (34-66%) red, pink granulation within the wound bed. There is a medium MYOSHA, CUADRAS. ([id number]) (34-66%) amount of necrotic tissue within the wound bed including Adherent Slough. Wound #11 status is Open. Original cause of wound was Gradually Appeared. The date acquired was: 04/18/2021. The wound is located on the Right Lower Leg. The wound measures 5cm length x 7.5cm width x 0.1cm depth; 29.452cm^2 area and 2.945cm^3 volume. There is Fat Layer (Subcutaneous Tissue) exposed. There is no tunneling or undermining noted. There is a large amount of serosanguineous drainage noted. There is large (67-100%) red granulation within the wound bed. There is a small (1-33%) amount of necrotic tissue within the wound bed including Adherent Slough. Wound #3 status is Healed - Epithelialized. Original cause of wound was Gradually Appeared. The date acquired was: 02/09/2021. The wound has been in treatment 11 weeks. The wound is located on the Left Toe Great. The wound measures 0cm length x 0cm width x 0cm depth; 0cm^2 area and 0cm^3 volume. There is a none present amount of drainage noted. There is no granulation within the wound bed. There is no necrotic tissue within the wound bed. Wound #9 status is Open. Original cause of wound was Gradually Appeared. The date acquired was: 06/01/2021. The wound has been in treatment 2 weeks. The wound is located on the Right Lower Leg. The wound measures 1.5cm length x 0.5cm  width x 0.1cm depth; 0.589cm^2 area and 0.059cm^3 volume. There is Fat Layer (Subcutaneous Tissue) exposed. There is no tunneling or undermining noted. There is a medium amount of serosanguineous drainage noted. There is large (67-100%) red, pink granulation within the wound bed. There is a small (1-33%) amount of necrotic tissue within the wound bed including Adherent Slough. Assessment Active Problems ICD-10 Non-pressure chronic ulcer of other part of right lower leg with fat layer exposed Chronic venous hypertension (idiopathic) with ulcer and inflammation of right lower extremity Chronic venous hypertension (idiopathic) with ulcer and inflammation of left lower extremity Unspecified open wound, left foot, subsequent encounter Patient has developed more wounds to her right lower extremity. These are limited to skin breakdown. At this time I will go down on compression to Kerlix/Coban to see if this helps. Since her wrap rolls down a higher compression creates more wounds for her. She has no open wounds to her left lower extremity and her previous left great toe wound is epithelialized. Plan Follow-up Appointments: Return Appointment in 1 week. Nurse Visit as needed Home Health: [place]: - [place] for wound care. May utilize formulary equivalent dressing for wound treatment orders unless otherwise specified. Home Health Nurse may visit [facility]N to address patient s wound care needs. - SEE additional order for OT eval and treat for deconditioning, unable to don socks and needs to wear Vecro compression therapy Scheduled days for dressing changes to be completed; exception, patient has scheduled wound care visit that day. **Please direct any NON-WOUND related issues/requests for orders to patient's Primary Care Physician. **If current dressing causes regression in wound condition, may D/C ordered dressing product/s and apply Normal Saline Moist Dressing daily  until next [facility] or Other MD appointment. **Notify [facility] of regression in wound condition at [place] Bathing/ Shower/ Hygiene: May shower; gently cleanse wound with antibacterial soap, rinse and pat dry prior to dressing wounds May shower with wound dressing protected with water repellent cover or cast protector. No tub bath.  Anesthetic (Use 'Patient Medications' Section for Anesthetic Order Entry): Lidocaine applied to wound bed Edema Control - Lymphedema / Segmental Compressive Device / Other: Optional: One layer of unna paste to top of compression wrap (to act as an anchor). - please use or compression wrap will not stay up Elevate leg(s) parallel to the floor when sitting. DO YOUR BEST to sleep in the bed at night. DO NOT sleep in your recliner. Long hours of sitting in a recliner leads to swelling of the legs and/or potential wounds on your backside. Other: - Kerlix and Coban left leg as well-wrap base of toes to behind knee-same as other leg bilateral wrap lower compression Additional Orders / Instructions: Follow Nutritious Diet and Increase Protein Intake Other: - OT EVAL AND TREAT FOR DECONDITIONING-UNABLE TO DON SOCKS/needs to be able to don velcro wraps WOUND #10: - Lower Leg Wound Laterality: Right, Anterior, Proximal Cleanser: Wound Cleanser 3 x Per Week/30 Days Discharge Instructions: Wash your hands with soap and water. Remove old dressing, discard into plastic bag and place into trash. Cleanse the wound with Wound Cleanser prior to applying a clean dressing using gauze sponges, not tissues or cotton balls. Do not scrub or use excessive force. Pat dry using gauze sponges, not tissue or cotton balls. Primary Dressing: Silvercel Small 2x2 (in/in) 3 x Per Week/30 Days BARBETTE, MCGLAUN ([id number]) Discharge Instructions: Apply Silvercel Small 2x2 (in/in) as instructed Secondary Dressing: ABD Pad 5x9 (in/in) 3 x Per  Week/30 Days Discharge Instructions: Cover with ABD pad Secured With: Coban Cohesive Bandage 4x5 (yds) Stretched 3 x Per Week/30 Days Discharge Instructions: Apply Coban as directed. Secured With: [employer] or Non-Sterile 6-ply 4.5x4 (yd/yd) 3 x Per Week/30 Days Discharge Instructions: Apply Kerlix as directed WOUND #11: - Lower Leg Wound Laterality: Right Cleanser: Wound Cleanser 3 x Per Week/30 Days Discharge Instructions: Wash your hands with soap and water. Remove old dressing, discard into plastic bag and place into trash. Cleanse the wound with Wound Cleanser prior to applying a clean dressing using gauze sponges, not tissues or cotton balls. Do not scrub or use excessive force. Pat dry using gauze sponges, not tissue or cotton balls. Primary Dressing: Silvercel Small 2x2 (in/in) 3 x Per Week/30 Days Discharge Instructions: Apply Silvercel Small 2x2 (in/in) as instructed Secondary Dressing: ABD Pad 5x9 (in/in) 3 x Per Week/30 Days Discharge Instructions: Cover with ABD pad Secured With: Coban Cohesive Bandage 4x5 (yds) Stretched 3 x Per Week/30 Days Discharge Instructions: Apply Coban as directed. Secured With: [employer] or Non-Sterile 6-ply 4.5x4 (yd/yd) 3 x Per Week/30 Days Discharge Instructions: Apply Kerlix as directed WOUND #9: - Lower Leg Wound Laterality: Right Cleanser: Wound Cleanser 3 x Per Week/30 Days Discharge Instructions: Wash your hands with soap and water. Remove old dressing, discard into plastic bag and place into trash. Cleanse the wound with Wound Cleanser prior to applying a clean dressing using gauze sponges, not tissues or cotton balls. Do not scrub or use excessive force. Pat dry using gauze sponges, not tissue or cotton balls. Primary Dressing: Silvercel Small 2x2 (in/in) 3 x Per Week/30 Days Discharge Instructions: Apply Silvercel Small 2x2 (in/in) as instructed Secondary Dressing: ABD Pad 5x9 (in/in) 3 x Per Week/30 Days Discharge  Instructions: Cover with ABD pad Secured With: Coban Cohesive Bandage 4x5 (yds) Stretched 3 x Per Week/30 Days Discharge Instructions: Apply Coban as directed. Secured With: [employer] or Non-Sterile 6-ply 4.5x4 (yd/yd) 3 x Per Week/30 Days Discharge Instructions: Apply Kerlix  as directed 1. Silver alginate under Kerlix/Coban 2. Follow-up in 1 week Electronic Signature(s) Signed: 06/15/2021 4:05:13 PM By: Kalman Shan DO Entered By: Kalman Shan on 06/15/2021 16:04:26 [name] ([id number]) -------------------------------------------------------------------------------- SuperBill Details Patient Name: [name]. Date of Service: 06/15/2021 Medical Record Number: [id number] Patient Account Number: [id number] Date of Birth/Sex: [birth year] (70 y.o. F) Treating RN: Donnamarie Poag Primary Care Provider: Rutherford Guys Other Clinician: Referring Provider: Rutherford Guys Treating Provider/Extender: Yaakov Guthrie in Treatment: 11 Diagnosis Coding ICD-10 Codes Code Description I87.331 Chronic venous hypertension (idiopathic) with ulcer and inflammation of right lower extremity I87.332 Chronic venous hypertension (idiopathic) with ulcer and inflammation of left lower extremity S91.302D Unspecified open wound, left foot, subsequent encounter Facility Procedures CPT4 Code: [id number] Description: 99214 - WOUND CARE VISIT-LEV 4 EST PT Modifier: Quantity: 1 Physician Procedures CPT4 Code Description: [id number] 99213 - WC PHYS LEVEL 3 - EST PT Modifier: Quantity: 1 CPT4 Code Description: ICD-10 Diagnosis Description 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

## 2021-06-15 NOTE — Progress Notes (Signed)
JUANELL, SAFFO ([id number]) Visit Report for 06/15/2021 Arrival Information Details Patient Name: [name], [name]. Date of Service: 06/15/2021 2:30 PM Medical Record Number: [id number] Patient Account Number: [id number] Date of Birth/Sex: 01-Dec-1950 (70 y.o. F) Treating RN: Donnamarie Poag Primary Care Messiah Ahr: Rutherford Guys Other Clinician: Referring Emmanual Gauthreaux: Rutherford Guys Treating Jemari Hallum/Extender: Yaakov Guthrie in Treatment: 11 Visit Information History Since Last Visit Added or deleted any medications: No Patient Arrived: [name] Had a fall or experienced change in No Arrival Time: 14:56 activities of daily living that may affect Accompanied By: self risk of falls: Transfer Assistance: None Hospitalized since last visit: No Patient Identification Verified: Yes Has Dressing in Place as Prescribed: Yes Secondary Verification Process Completed: Yes Has Compression in Place as Prescribed: Yes Patient Requires Transmission-Based No Pain Present Now: No Precautions: Patient Has Alerts: Yes Patient Alerts: Patient on Blood Thinner DIABETIC ELIQUIS Electronic Signature(s) Signed: 06/15/2021 4:04:38 PM By: Donnamarie Poag Entered By: Donnamarie Poag on 06/15/2021 14:58:12 [name] ([id number]) -------------------------------------------------------------------------------- Clinic Level of Care Assessment Details Patient Name: [name]. Date of Service: 06/15/2021 2:30 PM Medical Record Number: [id number] Patient Account Number: [id number] Date of Birth/Sex: 04/22/1951 (70 y.o. F) Treating RN: Donnamarie Poag Primary Care Raihana Balderrama: Rutherford Guys Other Clinician: Referring Arnoldo Hildreth: Rutherford Guys Treating Chue Berkovich/Extender: Yaakov Guthrie in Treatment: 11 Clinic Level of Care Assessment Items TOOL 4 Quantity Score []  - Use when only an EandM is performed on FOLLOW-UP visit 0 ASSESSMENTS - Nursing Assessment / Reassessment []  - Reassessment of Co-morbidities  (includes updates in patient status) 0 []  - 0 Reassessment of Adherence to Treatment Plan ASSESSMENTS - Wound and Skin Assessment / Reassessment []  - Simple Wound Assessment / Reassessment - one wound 0 X- 3 5 Complex Wound Assessment / Reassessment - multiple wounds []  - 0 Dermatologic / Skin Assessment (not related to wound area) ASSESSMENTS - Focused Assessment []  - Circumferential Edema Measurements - multi extremities 0 []  - 0 Nutritional Assessment / Counseling / Intervention []  - 0 Lower Extremity Assessment (monofilament, tuning fork, pulses) []  - 0 Peripheral Arterial Disease Assessment (using hand held doppler) ASSESSMENTS - Ostomy and/or Continence Assessment and Care []  - Incontinence Assessment and Management 0 []  - 0 Ostomy Care Assessment and Management (repouching, etc.) PROCESS - Coordination of Care X - Simple Patient / Family Education for ongoing care 1 15 []  - 0 Complex (extensive) Patient / Family Education for ongoing care []  - 0 Staff obtains Programmer, systems, Records, Test Results / Process Orders X- 1 10 Staff telephones HHA, Nursing Homes / Clarify orders / etc []  - 0 Routine Transfer to another Facility (non-emergent condition) []  - 0 Routine Hospital Admission (non-emergent condition) []  - 0 New Admissions / Biomedical engineer / Ordering NPWT, Apligraf, etc. []  - 0 Emergency Hospital Admission (emergent condition) X- 1 10 Simple Discharge Coordination []  - 0 Complex (extensive) Discharge Coordination PROCESS - Special Needs []  - Pediatric / Minor Patient Management 0 []  - 0 Isolation Patient Management []  - 0 Hearing / Language / Visual special needs []  - 0 Assessment of Community assistance (transportation, D/C planning, etc.) []  - 0 Additional assistance / Altered mentation []  - 0 Support Surface(s) Assessment (bed, cushion, seat, etc.) INTERVENTIONS - Wound Cleansing / Measurement [name], [name]. ([id number]) []  - 0 Simple Wound  Cleansing - one wound X- 3 5 Complex Wound Cleansing - multiple wounds X- 1 5 Wound Imaging (photographs - any number of wounds) []  - 0 Wound Tracing (instead of photographs) []  -  0 Simple Wound Measurement - one wound X- 3 5 Complex Wound Measurement - multiple wounds INTERVENTIONS - Wound Dressings X - Small Wound Dressing one or multiple wounds 3 10 []  - 0 Medium Wound Dressing one or multiple wounds []  - 0 Large Wound Dressing one or multiple wounds X- 1 5 Application of Medications - topical []  - 0 Application of Medications - injection INTERVENTIONS - Miscellaneous []  - External ear exam 0 []  - 0 Specimen Collection (cultures, biopsies, blood, body fluids, etc.) []  - 0 Specimen(s) / Culture(s) sent or taken to Lab for analysis []  - 0 Patient Transfer (multiple staff / Civil Service fast streamer / Similar devices) []  - 0 Simple Staple / Suture removal (25 or less) []  - 0 Complex Staple / Suture removal (26 or more) []  - 0 Hypo / Hyperglycemic Management (close monitor of Blood Glucose) []  - 0 Ankle / Brachial Index (ABI) - do not check if billed separately []  - 0 Vital Signs Has the patient been seen at the hospital within the last three years: Yes Total Score: 120 Level Of Care: New/Established - Level 4 Electronic Signature(s) Signed: 06/15/2021 4:04:38 PM By: Donnamarie Poag Entered By: Donnamarie Poag on 06/15/2021 15:25:47 [name] ([id number]) -------------------------------------------------------------------------------- Encounter Discharge Information Details Patient Name: [name]. Date of Service: 06/15/2021 2:30 PM Medical Record Number: [id number] Patient Account Number: [id number] Date of Birth/Sex: 1950/12/16 (70 y.o. F) Treating RN: Donnamarie Poag Primary Care Lucca Greggs: Rutherford Guys Other Clinician: Referring Dereon Corkery: Rutherford Guys Treating Judaea Burgoon/Extender: Yaakov Guthrie in Treatment: 11 Encounter Discharge Information Items Discharge  Condition: Stable Ambulatory Status: Walker Discharge Destination: Home Transportation: Private Auto Accompanied By: self Schedule Follow-up Appointment: Yes Clinical Summary of Care: Electronic Signature(s) Signed: 06/15/2021 4:04:38 PM By: Donnamarie Poag Entered By: Donnamarie Poag on 06/15/2021 15:29:47 [name] ([id number]) -------------------------------------------------------------------------------- Lower Extremity Assessment Details Patient Name: [name]. Date of Service: 06/15/2021 2:30 PM Medical Record Number: [id number] Patient Account Number: [id number] Date of Birth/Sex: 1951-03-11 (70 y.o. F) Treating RN: Donnamarie Poag Primary Care Srihaan Mastrangelo: Rutherford Guys Other Clinician: Referring Belicia Difatta: Rutherford Guys Treating Ivyrose Hashman/Extender: Yaakov Guthrie in Treatment: 11 Edema Assessment Assessed: [Left: Yes] [Right: Yes] Edema: [Left: Yes] [Right: Yes] Calf Left: Right: Point of Measurement: 29 cm From Medial Instep 47.5 cm 50.5 cm Ankle Left: Right: Point of Measurement: 10 cm From Medial Instep 20.5 cm 21.5 cm Electronic Signature(s) Signed: 06/15/2021 4:04:38 PM By: Donnamarie Poag Entered By: Donnamarie Poag on 06/15/2021 15:14:46 [name] ([id number]) -------------------------------------------------------------------------------- Multi Wound Chart Details Patient Name: [name]. Date of Service: 06/15/2021 2:30 PM Medical Record Number: [id number] Patient Account Number: [id number] Date of Birth/Sex: 1951/05/04 (70 y.o. 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 Comorbid History: Cataracts, Arrhythmia, Cataracts, Arrhythmia, Cataracts, Arrhythmia, Hypertension, Type II Diabetes, Hypertension, Type II Diabetes, Hypertension, Type II Diabetes, Osteoarthritis, Neuropathy Osteoarthritis, Neuropathy Osteoarthritis, Neuropathy Date Acquired: 06/08/2021 04/18/2021 02/09/2021 Weeks of Treatment: 1 0 11 Wound Status: Open Open Healed - Epithelialized Clustered Wound: No Yes No Measurements L x W x D (cm) 2.5x1x0.1 5x7.5x0.1  0x0x0 Area (cm) : 1.963 29.452 0 Volume (cm) : 0.196 2.945 0 % Reduction in Area: -233.30% N/A 100.00% % Reduction in Volume: -232.20% N/A 100.00% Classification: Partial Thickness Full Thickness Without Exposed Grade 1 Support Structures Exudate Amount: Medium Large None Present Exudate Type: Serosanguineous Serosanguineous N/A Exudate Color: red, brown red, brown N/A Granulation Amount: Medium (34-66%) Large (67-100%) None Present (0%) Granulation Quality: Red, Pink Red N/A Necrotic Amount: Medium (34-66%) Small (1-33%) None Present (0%) Exposed Structures: Fascia: No Fat Layer (Subcutaneous Tissue): Fascia: No Fat Layer (Subcutaneous Tissue): Yes Fat Layer (Subcutaneous Tissue): No Fascia: No No Tendon: No Tendon: No Tendon: No Muscle: No Muscle: No Muscle: No Joint: No Joint: No Joint: No Bone: No Bone: No Bone: No Limited to Skin Breakdown Epithelialization: N/A N/A None Wound Number: 9 N/A N/A Photos: N/A N/A [name] ([id number]) Wound Location: Right Lower Leg N/A N/A Wounding Event: Gradually Appeared N/A N/A Primary Etiology: Venous Leg Ulcer N/A N/A Comorbid History: Cataracts, Arrhythmia, N/A N/A Hypertension, Type II Diabetes, Osteoarthritis, Neuropathy Date Acquired: 06/01/2021 N/A  N/A Weeks of Treatment: 2 N/A N/A Wound Status: Open N/A N/A Clustered Wound: No N/A N/A Measurements L x W x D (cm) 1.5x0.5x0.1 N/A N/A Area (cm) : 0.589 N/A N/A Volume (cm) : 0.059 N/A N/A % Reduction in Area: 77.30% N/A N/A % Reduction in Volume: 77.20% N/A N/A Classification: Full Thickness Without Exposed N/A N/A Support Structures Exudate Amount: Medium N/A N/A Exudate Type: Serosanguineous N/A N/A Exudate Color: red, brown N/A N/A Granulation Amount: Large (67-100%) N/A N/A Granulation Quality: Red, Pink N/A N/A Necrotic Amount: Small (1-33%) N/A N/A Exposed Structures: Fat Layer (Subcutaneous Tissue): N/A N/A Yes Fascia: No Tendon: No Muscle: No Joint: No Bone: No Epithelialization: N/A N/A N/A Treatment Notes Electronic Signature(s) Signed: 06/15/2021 4:04:38 PM By: Donnamarie Poag Entered By: Donnamarie Poag on 06/15/2021 15:19:35 [name] ([id number]) -------------------------------------------------------------------------------- [place] Details Patient Name: [name]. Date of Service: 06/15/2021 2:30 PM Medical Record Number: [id number] Patient Account Number: [id number] Date of Birth/Sex: [birth year] (70 y.o. F) Treating RN: Donnamarie Poag Primary Care Fatoumata Albaugh: Rutherford Guys Other Clinician: Referring Merrell Rettinger: Rutherford Guys Treating Michaelpaul Apo/Extender: Yaakov Guthrie in Treatment: 11 Active Inactive Wound/Skin Impairment Nursing Diagnoses: Impaired tissue integrity Goals: Patient/caregiver will verbalize understanding of skin care regimen Date Initiated: 03/30/2021 Date Inactivated: 04/27/2021 Target Resolution Date: 03/30/2021 Goal Status: Met Ulcer/skin breakdown will have a volume reduction of 30% by week 4 Date Initiated: 03/30/2021 Date Inactivated: 05/11/2021 Target Resolution Date: 04/30/2021 Goal Status: Met Ulcer/skin breakdown will have a volume reduction of 50% by week 8 Date Initiated: 03/30/2021 Date  Inactivated: 05/25/2021 Target Resolution Date: 05/30/2021 Goal Status: Met Ulcer/skin breakdown will have a volume reduction of 80% by week 12 Date Initiated: 03/30/2021 Target Resolution Date: 06/30/2021 Goal Status: Active Ulcer/skin breakdown will heal within 14 weeks Date Initiated: 03/30/2021 Target Resolution Date: 07/31/2021 Goal Status: Active Interventions: Assess patient/caregiver ability to obtain necessary supplies Assess patient/caregiver ability to perform ulcer/skin care regimen upon admission and as needed Assess ulceration(s) every visit Treatment Activities: Patient referred to home care : 03/30/2021 Referred to DME Saydi Kobel for dressing supplies : 03/30/2021 Skin care regimen initiated : 03/30/2021 Notes: Electronic Signature(s) Signed: 06/15/2021 4:04:38 PM By: Donnamarie Poag Entered By: Donnamarie Poag on 06/15/2021 15:18:39 [name] ([id number]) -------------------------------------------------------------------------------- Non-Wound Condition Assessment Details Patient Name: [name]. Date of Service: 06/15/2021 2:30 PM Medical Record Number: [id number] Patient Account Number: [id number] Date of Birth/Sex: 1950/08/10 (70 y.o.  F) Treating RN: Donnamarie Poag Primary Care Greg Cratty: Rutherford Guys Other Clinician: Referring Morayo Leven: Rutherford Guys Treating Masai Kidd/Extender: Yaakov Guthrie in Treatment: 11 Non-Wound Condition: Condition: Rash / Dermatitis Location: Leg Side: Left Photos Electronic Signature(s) Signed: 06/15/2021 4:04:38 PM By: Donnamarie Poag Entered By: Donnamarie Poag on 06/15/2021 15:15:18 [name] ([id number]) -------------------------------------------------------------------------------- Pain Assessment Details Patient Name: [name]. Date of Service: 06/15/2021 2:30 PM Medical Record Number: [id number] Patient Account Number: [id number] Date of Birth/Sex: May 06, 1951 (70 y.o. F) Treating RN: Donnamarie Poag Primary Care  Damien Cisar: Rutherford Guys Other Clinician: Referring Chantavia Bazzle: Rutherford Guys Treating Zenita Kister/Extender: Yaakov Guthrie in Treatment: 11 Active Problems Location of Pain Severity and Description of Pain Patient Has Paino No Site Locations Rate the pain. Current Pain Level: 0 Pain Management and Medication Current Pain Management: Electronic Signature(s) Signed: 06/15/2021 4:04:38 PM By: Donnamarie Poag Entered By: Donnamarie Poag on 06/15/2021 14:59:03 [name] ([id number]) -------------------------------------------------------------------------------- Patient/Caregiver Education Details Patient Name: [name]. Date of Service: 06/15/2021 2:30 PM Medical Record Number: [id number] Patient Account Number: [id number] Date of Birth/Gender: 07/26/1950 (70 y.o. F) Treating RN: Donnamarie Poag Primary Care Physician: Rutherford Guys Other Clinician: Referring Physician: Rutherford Guys Treating Physician/Extender: Yaakov Guthrie in Treatment: 11 Education Assessment Education Provided To: Patient Education Topics Provided Wound/Skin Impairment: Electronic Signature(s) Signed: 06/15/2021 4:04:38 PM By: Donnamarie Poag Entered By: Donnamarie Poag on 06/15/2021 15:26:15 [name] ([id number]) -------------------------------------------------------------------------------- Wound Assessment Details Patient Name: [name]. Date of Service: 06/15/2021 2:30 PM Medical Record Number: [id number] Patient Account Number: [id number] Date of Birth/Sex: June 22, 1951 (70 y.o. F) Treating RN: Donnamarie Poag Primary Care Dennie Vecchio: Rutherford Guys Other Clinician: Referring Dresden Lozito: Rutherford Guys Treating Moni Rothrock/Extender: Yaakov Guthrie in Treatment: 11 Wound Status Wound Number: 10 Primary Venous Leg Ulcer Etiology: Wound Location: Right, Proximal, Anterior Lower Leg Wound Status: Open Wounding Event: Gradually Appeared Comorbid Cataracts, Arrhythmia, Hypertension, Type II  Diabetes, Date Acquired: 06/08/2021 History: Osteoarthritis, Neuropathy Weeks Of Treatment: 1 Clustered Wound: No Photos Wound Measurements Length: (cm) 2.5 Width: (cm) 1 Depth: (cm) 0.1 Area: (cm) 1.963 Volume: (cm) 0.196 % Reduction in Area: -233.3% % Reduction in Volume: -232.2% Tunneling: No Undermining: No Wound Description Classification: Partial Thickness Exudate Amount: Medium Exudate Type: Serosanguineous Exudate Color: red, brown Foul Odor After Cleansing: No Slough/Fibrino Yes Wound Bed Granulation Amount: Medium (34-66%) Exposed Structure Granulation Quality: Red, Pink Fascia Exposed: No Necrotic Amount: Medium (34-66%) Fat Layer (Subcutaneous Tissue) Exposed: No Necrotic Quality: Adherent Slough Tendon Exposed: No Muscle Exposed: No Joint Exposed: No Bone Exposed: No Limited to Skin Breakdown Treatment Notes Wound #10 (Lower Leg) Wound Laterality: Right, Anterior, Proximal Cleanser Wound Cleanser Discharge Instruction: Wash your hands with soap and water. Remove old dressing, discard into plastic bag and place into trash. Cleanse the wound with Wound Cleanser prior to applying a clean dressing using gauze sponges, not tissues or cotton balls. Do not scrub or use excessive force. Pat dry using gauze sponges, not tissue or cotton balls. DRUANNE, BOSQUES ([id number]) Peri-Wound Care Topical Primary Dressing Silvercel Small 2x2 (in/in) Discharge Instruction: Apply Silvercel Small 2x2 (in/in) as instructed Secondary Dressing ABD Pad 5x9 (in/in) Discharge Instruction: Cover with ABD pad Secured With Coban Cohesive Bandage 4x5 (yds) Stretched Discharge Instruction: Apply Coban as directed. Kerlix Roll Sterile or Non-Sterile 6-ply 4.5x4 (yd/yd) Discharge Instruction: Apply Kerlix as directed Compression Wrap Compression Stockings Add-Ons Electronic Signature(s) Signed: 06/15/2021 4:04:38 PM By: Donnamarie Poag Entered By: Donnamarie Poag on 06/15/2021  15:13:27 [name] ([id number]) --------------------------------------------------------------------------------  Wound Assessment Details Patient Name: [name], [name]. Date of Service: 06/15/2021 2:30 PM Medical Record Number: [id number] Patient Account Number: [id number] Date of Birth/Sex: [birth year] (70 y.o. F) Treating RN: Donnamarie Poag Primary Care Prescious Hurless: Rutherford Guys Other Clinician: Referring Rowyn Mustapha: Rutherford Guys Treating Juandedios Dudash/Extender: Yaakov Guthrie in Treatment: 11 Wound Status Wound Number: 11 Primary Venous Leg Ulcer Etiology: Wound Location: Right Lower Leg Wound Status: Open Wounding Event: Gradually Appeared Comorbid Cataracts, Arrhythmia, Hypertension, Type II Diabetes, Date Acquired: 04/18/2021 History: Osteoarthritis, Neuropathy Weeks Of Treatment: 0 Clustered Wound: Yes Photos Wound Measurements Length: (cm) 5 Width: (cm) 7.5 Depth: (cm) 0.1 Area: (cm) 29.452 Volume: (cm) 2.945 % Reduction in Area: % Reduction in Volume: Tunneling: No Undermining: No Wound Description Classification: Full Thickness Without Exposed Support Structures Exudate Amount: Large Exudate Type: Serosanguineous Exudate Color: red, brown Foul Odor After Cleansing: No Slough/Fibrino Yes Wound Bed Granulation Amount: Large (67-100%) Exposed Structure Granulation Quality: Red Fascia Exposed: No Necrotic Amount: Small (1-33%) Fat Layer (Subcutaneous Tissue) Exposed: Yes Necrotic Quality: Adherent Slough Tendon Exposed: No Muscle Exposed: No Joint Exposed: No Bone Exposed: No Treatment Notes Wound #11 (Lower Leg) Wound Laterality: Right Cleanser Wound Cleanser Discharge Instruction: Wash your hands with soap and water. Remove old dressing, discard into plastic bag and place into trash. Cleanse the wound with Wound Cleanser prior to applying a clean dressing using gauze sponges, not tissues or cotton balls. Do not scrub or use excessive force. Pat dry  using gauze sponges, not tissue or cotton balls. [name], [name] ([id number]) Peri-Wound Care Topical Primary Dressing Silvercel Small 2x2 (in/in) Discharge Instruction: Apply Silvercel Small 2x2 (in/in) as instructed Secondary Dressing ABD Pad 5x9 (in/in) Discharge Instruction: Cover with ABD pad Secured With Coban Cohesive Bandage 4x5 (yds) Stretched Discharge Instruction: Apply Coban as directed. Kerlix Roll Sterile or Non-Sterile 6-ply 4.5x4 (yd/yd) Discharge Instruction: Apply Kerlix as directed Compression Wrap Compression Stockings Add-Ons Electronic Signature(s) Signed: 06/15/2021 4:04:38 PM By: Donnamarie Poag Entered By: Donnamarie Poag on 06/15/2021 15:10:52 [name] ([id number]) -------------------------------------------------------------------------------- Wound Assessment Details Patient Name: [name]. Date of Service: 06/15/2021 2:30 PM Medical Record Number: [id number] Patient Account Number: [id number] Date of Birth/Sex: 1951-03-14 (70 y.o. F) Treating RN: Donnamarie Poag Primary Care Ayumi Wangerin: Rutherford Guys Other Clinician: Referring Keisuke Hollabaugh: Rutherford Guys Treating Zorah Backes/Extender: Yaakov Guthrie in Treatment: 11 Wound Status Wound Number: 3 Primary Diabetic Wound/Ulcer of the Lower Extremity Etiology: Wound Location: Left Toe Great Wound Status: Healed - Epithelialized Wounding Event: Gradually Appeared Comorbid Cataracts, Arrhythmia, Hypertension, Type II Diabetes, Date Acquired: 02/09/2021 History: Osteoarthritis, Neuropathy Weeks Of Treatment: 11 Clustered Wound: No Wound Measurements Length: (cm) 0 Width: (cm) 0 Depth: (cm) 0 Area: (cm) Volume: (cm) % Reduction in Area: 100% % Reduction in Volume: 100% Epithelialization: None 0 0 Wound Description Classification: Grade 1 Exudate Amount: None Present Foul Odor After Cleansing: No Slough/Fibrino No Wound Bed Granulation Amount: None Present (0%) Exposed Structure Necrotic  Amount: None Present (0%) Fascia Exposed: No Fat Layer (Subcutaneous Tissue) Exposed: No Tendon Exposed: No Muscle Exposed: No Joint Exposed: No Bone Exposed: No Electronic Signature(s) Signed: 06/15/2021 4:04:38 PM By: Donnamarie Poag Entered By: Donnamarie Poag on 06/15/2021 15:07:44 [name] ([id number]) -------------------------------------------------------------------------------- Wound Assessment Details Patient Name: [name]. Date of Service: 06/15/2021 2:30 PM Medical Record Number: [id number] Patient Account Number: [id number] Date of Birth/Sex: 24-Dec-1950 (70 y.o. F) Treating RN: Donnamarie Poag Primary Care Jenevieve Kirschbaum: Rutherford Guys Other Clinician: Referring Mak Bonny: Rutherford Guys Treating Torez Beauregard/Extender: Yaakov Guthrie in Treatment:  11 Wound Status Wound Number: 9 Primary Venous Leg Ulcer Etiology: Wound Location: Right Lower Leg Wound Status: Open Wounding Event: Gradually Appeared Comorbid Cataracts, Arrhythmia, Hypertension, Type II Diabetes, Date Acquired: 06/01/2021 History: Osteoarthritis, Neuropathy Weeks Of Treatment: 2 Clustered Wound: No Photos Wound Measurements Length: (cm) 1.5 Width: (cm) 0.5 Depth: (cm) 0.1 Area: (cm) 0.589 Volume: (cm) 0.059 % Reduction in Area: 77.3% % Reduction in Volume: 77.2% Tunneling: No Undermining: No Wound Description Classification: Full Thickness Without Exposed Support Structures Exudate Amount: Medium Exudate Type: Serosanguineous Exudate Color: red, brown Foul Odor After Cleansing: No Slough/Fibrino Yes Wound Bed Granulation Amount: Large (67-100%) Exposed Structure Granulation Quality: Red, Pink Fascia Exposed: No Necrotic Amount: Small (1-33%) Fat Layer (Subcutaneous Tissue) Exposed: Yes Necrotic Quality: Adherent Slough Tendon Exposed: No Muscle Exposed: No Joint Exposed: No Bone Exposed: No Treatment Notes Wound #9 (Lower Leg) Wound Laterality: Right Cleanser Wound  Cleanser Discharge Instruction: Wash your hands with soap and water. Remove old dressing, discard into plastic bag and place into trash. Cleanse the wound with Wound Cleanser prior to applying a clean dressing using gauze sponges, not tissues or cotton balls. Do not scrub or use excessive force. Pat dry using gauze sponges, not tissue or cotton balls. [name], [name] ([id number]) Peri-Wound Care Topical Primary Dressing Silvercel Small 2x2 (in/in) Discharge Instruction: Apply Silvercel Small 2x2 (in/in) as instructed Secondary Dressing ABD Pad 5x9 (in/in) Discharge Instruction: Cover with ABD pad Secured With Coban Cohesive Bandage 4x5 (yds) Stretched Discharge Instruction: Apply Coban as directed. Kerlix Roll Sterile or Non-Sterile 6-ply 4.5x4 (yd/yd) Discharge Instruction: Apply Kerlix as directed Compression Wrap Compression Stockings Add-Ons Electronic Signature(s) Signed: 06/15/2021 4:04:38 PM By: Donnamarie Poag Entered By: Donnamarie Poag on 06/15/2021 15:11:38 [name] ([id number]) -------------------------------------------------------------------------------- [place] Details Patient Name: [name]. Date of Service: 06/15/2021 2:30 PM Medical Record Number: [id number] Patient Account Number: [id number] Date of Birth/Sex: 29-Sep-1950 (70 y.o. F) Treating RN: Donnamarie Poag Primary Care Christobal Morado: Rutherford Guys Other Clinician: Referring Indria Bishara: Rutherford Guys Treating Averie Meiner/Extender: Yaakov Guthrie in Treatment: 11 Vital Signs Time Taken: 14:57 Temperature (F): 98.4 Height (in): 66 Pulse (bpm): 101 Weight (lbs): 332 Respiratory Rate (breaths/min): 20 Body Mass Index (BMI): 53.6 Blood Pressure (mmHg): 157/77 Reference Range: 80 - 120 mg / dl Electronic Signature(s) Signed: 06/15/2021 4:04:38 PM By: Donnamarie Poag Entered ByDonnamarie Poag on 06/15/2021 14:58:46

## 2021-06-22 ENCOUNTER — Encounter (HOSPITAL_BASED_OUTPATIENT_CLINIC_OR_DEPARTMENT_OTHER): Payer: Medicare Other | Admitting: Internal Medicine

## 2021-06-22 ENCOUNTER — Other Ambulatory Visit: Payer: Self-pay

## 2021-06-22 DIAGNOSIS — I87332 Chronic venous hypertension (idiopathic) with ulcer and inflammation of left lower extremity: Secondary | ICD-10-CM

## 2021-06-22 DIAGNOSIS — S91302D Unspecified open wound, left foot, subsequent encounter: Secondary | ICD-10-CM

## 2021-06-22 DIAGNOSIS — I87331 Chronic venous hypertension (idiopathic) with ulcer and inflammation of right lower extremity: Secondary | ICD-10-CM | POA: Diagnosis not present

## 2021-06-22 DIAGNOSIS — I87333 Chronic venous hypertension (idiopathic) with ulcer and inflammation of bilateral lower extremity: Secondary | ICD-10-CM | POA: Diagnosis not present

## 2021-06-22 NOTE — Progress Notes (Signed)
ORALEE, RAPAPORT ([id number]) Visit Report for 06/22/2021 Arrival Information Details Patient Name: [name], [name]. Date of Service: 06/22/2021 2:30 PM Medical Record Number: [id number] Patient Account Number: [id number] Date of Birth/Sex: 1951-03-15 (70 y.o. F) Treating RN: Donnamarie Poag Primary Care Meeghan Skipper: Rutherford Guys Other Clinician: Referring Chesley Valls: Rutherford Guys Treating Mandy Peeks/Extender: Yaakov Guthrie in Treatment: 12 Visit Information History Since Last Visit Added or deleted any medications: No Patient Arrived: [name] Had a fall or experienced change in No Arrival Time: 14:37 activities of daily living that may affect Accompanied By: self risk of falls: Transfer Assistance: None Hospitalized since last visit: No Patient Identification Verified: Yes Has Dressing in Place as Prescribed: Yes Secondary Verification Process Completed: Yes Has Compression in Place as Prescribed: Yes Patient Requires Transmission-Based No Pain Present Now: No Precautions: Patient Has Alerts: Yes Patient Alerts: Patient on Blood Thinner DIABETIC ELIQUIS Electronic Signature(s) Signed: 06/22/2021 4:20:08 PM By: Donnamarie Poag Entered ByDonnamarie Poag on 06/22/2021 14:39:33 [name] ([id number]) -------------------------------------------------------------------------------- Compression Therapy Details Patient Name: [name]. Date of Service: 06/22/2021 2:30 PM Medical Record Number: [id number] Patient Account Number: [id number] Date of Birth/Sex: 1950/12/08 (70 y.o. F) Treating RN: Donnamarie Poag Primary Care Matea Stanard: Rutherford Guys Other Clinician: Referring Somya Jauregui: Rutherford Guys Treating Farren Nelles/Extender: Yaakov Guthrie in Treatment: 12 Compression Therapy Performed for Wound Assessment: Wound #10 Right,Proximal,Anterior Lower Leg Performed By: Junius Argyle, RN Compression Type: Double Layer Post Procedure Diagnosis Same as  Pre-procedure Electronic Signature(s) Signed: 06/22/2021 4:20:08 PM By: Donnamarie Poag Entered By: Donnamarie Poag on 06/22/2021 15:12:02 [name] ([id number]) -------------------------------------------------------------------------------- Compression Therapy Details Patient Name: [name]. Date of Service: 06/22/2021 2:30 PM Medical Record Number: [id number] Patient Account Number: [id number] Date of Birth/Sex: 10/28/1950 (70 y.o. F) Treating RN: Donnamarie Poag Primary Care Tanajah Boulter: Rutherford Guys Other Clinician: Referring Rielle Schlauch: Rutherford Guys Treating Destanie Tibbetts/Extender: Yaakov Guthrie in Treatment: 12 Compression Therapy Performed for Wound Assessment: NonWound Condition Rash / Dermatitis - Left Leg Performed By: Junius Argyle, RN Compression Type: Double Layer Post Procedure Diagnosis Same as Pre-procedure Electronic Signature(s) Signed: 06/22/2021 4:20:08 PM By: Donnamarie Poag Entered By: Donnamarie Poag on 06/22/2021 15:12:24 [name] ([id number]) -------------------------------------------------------------------------------- Encounter Discharge Information Details Patient Name: [name]. Date of Service: 06/22/2021 2:30 PM Medical Record Number: [id number] Patient Account Number: [id number] Date of Birth/Sex: 1950/10/06 (70 y.o. F) Treating RN: Donnamarie Poag Primary Care Rawlins Stuard: Rutherford Guys Other Clinician: Referring Parthenia Tellefsen: Rutherford Guys Treating Mariacristina Aday/Extender: Yaakov Guthrie in Treatment: 12 Encounter Discharge Information Items Post Procedure Vitals Discharge Condition: Stable Temperature (F): 97.9 Ambulatory Status: Walker Pulse (bpm): 97 Discharge Destination: Home Respiratory Rate (breaths/min): 16 Transportation: Private Auto Blood Pressure (mmHg): 158/88 Accompanied By: self Schedule Follow-up Appointment: Yes Clinical Summary of Care: Electronic Signature(s) Signed: 06/22/2021 4:20:08 PM By: Donnamarie Poag Entered By: Donnamarie Poag on 06/22/2021 15:27:56 [name] ([id number]) -------------------------------------------------------------------------------- Lower Extremity Assessment Details Patient Name: [name]. Date of Service: 06/22/2021 2:30 PM Medical Record Number: [id number] Patient Account Number: [id number] Date of Birth/Sex: [birth year] (70 y.o. F) Treating RN: Donnamarie Poag Primary Care Maite Burlison: Rutherford Guys Other Clinician: Referring Casen Pryor: Rutherford Guys Treating Gerrit Rafalski/Extender: Yaakov Guthrie in Treatment: 12 Edema Assessment Assessed: [Left: Yes] [Right: Yes] Edema: [Left: Yes] [Right: Yes] Calf Left: Right: Point of Measurement: 29 cm From Medial Instep 47 cm 51 cm Ankle Left: Right: Point of Measurement: 10 cm From Medial Instep 21 cm 22 cm Vascular Assessment Pulses: Dorsalis Pedis Palpable: [Left:Yes] [Right:Yes] Electronic Signature(s)  Signed: 06/22/2021 4:20:08 PM By: Donnamarie Poag Entered ByDonnamarie Poag on 06/22/2021 14:53:58 [name] ([id number]) -------------------------------------------------------------------------------- Multi Wound Chart Details Patient Name: [name]. Date of Service: 06/22/2021 2:30 PM Medical Record Number: [id number] Patient Account Number: [id number] Date of Birth/Sex: [birth year] (70 y.o. F) Treating RN: Donnamarie Poag Primary Care Kathrynne Kulinski: Rutherford Guys Other Clinician: Referring Nylah Butkus: Rutherford Guys Treating Cordney Barstow/Extender: Yaakov Guthrie in Treatment: 12 Vital Signs Height(in): 68 Pulse(bpm): 97 Weight(lbs): 332 Blood Pressure(mmHg): 158/88 Body Mass Index(BMI): 54 Temperature(F): 97.9 Respiratory Rate(breaths/min): 16 Photos: Wound Location: Right, Proximal, Anterior Lower Leg Right Lower Leg Right, Posterior Lower Leg Wounding Event: Gradually Appeared Gradually Appeared Gradually Appeared Primary Etiology: Venous Leg Ulcer Venous Leg Ulcer Venous Leg  Ulcer Comorbid History: Cataracts, Arrhythmia, Cataracts, Arrhythmia, Cataracts, Arrhythmia, Hypertension, Type II Diabetes, Hypertension, Type II Diabetes, Hypertension, Type II Diabetes, Osteoarthritis, Neuropathy Osteoarthritis, Neuropathy Osteoarthritis, Neuropathy Date Acquired: 06/08/2021 04/18/2021 06/01/2021 Weeks of Treatment: 2 1 3  Wound Status: Open Open Open Clustered Wound: No Yes No Measurements L x W x D (cm) 1x1x0.1 5x5x0.1 0.5x1x0.1 Area (cm) : 0.785 19.635 0.393 Volume (cm) : 0.079 1.963 0.039 % Reduction in Area: -33.30% 33.30% 84.80% % Reduction in Volume: -33.90% 33.30% 84.90% Classification: Partial Thickness Full Thickness Without Exposed Full Thickness Without Exposed Support Structures Support Structures Exudate Amount: Medium Large Medium Exudate Type: Serosanguineous Serosanguineous Serosanguineous Exudate Color: red, brown red, brown red, brown Granulation Amount: Medium (34-66%) Large (67-100%) Large (67-100%) Granulation Quality: Red, Pink Red Red, Pink Necrotic Amount: Medium (34-66%) Small (1-33%) Small (1-33%) Necrotic Tissue: Eschar, Adherent [place] Exposed Structures: Fascia: No Fat Layer (Subcutaneous Tissue): Fat Layer (Subcutaneous Tissue): Fat Layer (Subcutaneous Tissue): Yes Yes No Fascia: No Fascia: No Tendon: No Tendon: No Tendon: No Muscle: No Muscle: No Muscle: No Joint: No Joint: No Joint: No Bone: No Bone: No Bone: No Limited to Skin Breakdown Treatment Notes Electronic Signature(s) Signed: 06/22/2021 4:20:08 PM By: Donnamarie Poag Entered By: Donnamarie Poag on 06/22/2021 15:10:36 [name] ([id number]) -------------------------------------------------------------------------------- [place] Details Patient Name: [name]. Date of Service: 06/22/2021 2:30 PM Medical Record Number: [id number] Patient Account Number: [id number] Date of Birth/Sex: 10-10-1950 (70 y.o.  F) Treating RN: Donnamarie Poag Primary Care Miracle Criado: Rutherford Guys Other Clinician: Referring Jaquesha Boroff: Rutherford Guys Treating Natoya Viscomi/Extender: Yaakov Guthrie in Treatment: 12 Active Inactive Wound/Skin Impairment Nursing Diagnoses: Impaired tissue integrity Goals: Patient/caregiver will verbalize understanding of skin care regimen Date Initiated: 03/30/2021 Date Inactivated: 04/27/2021 Target Resolution Date: 03/30/2021 Goal Status: Met Ulcer/skin breakdown will have a volume reduction of 30% by week 4 Date Initiated: 03/30/2021 Date Inactivated: 05/11/2021 Target Resolution Date: 04/30/2021 Goal Status: Met Ulcer/skin breakdown will have a volume reduction of 50% by week 8 Date Initiated: 03/30/2021 Date Inactivated: 05/25/2021 Target Resolution Date: 05/30/2021 Goal Status: Met Ulcer/skin breakdown will have a volume reduction of 80% by week 12 Date Initiated: 03/30/2021 Target Resolution Date: 06/30/2021 Goal Status: Active Ulcer/skin breakdown will heal within 14 weeks Date Initiated: 03/30/2021 Target Resolution Date: 07/31/2021 Goal Status: Active Interventions: Assess patient/caregiver ability to obtain necessary supplies Assess patient/caregiver ability to perform ulcer/skin care regimen upon admission and as needed Assess ulceration(s) every visit Treatment Activities: Patient referred to home care : 03/30/2021 Referred to DME Yavier Snider for dressing supplies : 03/30/2021 Skin care regimen initiated : 03/30/2021 Notes: Electronic Signature(s) Signed: 06/22/2021 4:20:08 PM By: Donnamarie Poag Entered By: Donnamarie Poag on 06/22/2021 14:54:16 [name] ([id number]) -------------------------------------------------------------------------------- Pain Assessment Details Patient Name: [name],  Miranda F. Date of Service: 06/22/2021 2:30 PM Medical Record Number: [id number] Patient Account Number: [id number] Date of Birth/Sex: 10/22/1950 (70 y.o. F) Treating RN: Donnamarie Poag Primary Care Juwaun Inskeep: Rutherford Guys Other Clinician: Referring Lynnae Ludemann: Rutherford Guys Treating Tajai Suder/Extender: Yaakov Guthrie in Treatment: 12 Active Problems Location of Pain Severity and Description of Pain Patient Has Paino No Site Locations Rate the pain. Current Pain Level: 0 Pain Management and Medication Current Pain Management: Electronic Signature(s) Signed: 06/22/2021 4:20:08 PM By: Donnamarie Poag Entered By: Donnamarie Poag on 06/22/2021 14:41:10 [name] ([id number]) -------------------------------------------------------------------------------- Patient/Caregiver Education Details Patient Name: [name]. Date of Service: 06/22/2021 2:30 PM Medical Record Number: [id number] Patient Account Number: [id number] Date of Birth/Gender: 1951/04/10 (70 y.o. F) Treating RN: Donnamarie Poag Primary Care Physician: Rutherford Guys Other Clinician: Referring Physician: Rutherford Guys Treating Physician/Extender: Yaakov Guthrie in Treatment: 12 Education Assessment Education Provided To: Patient Education Topics Provided Wound/Skin Impairment: Electronic Signature(s) Signed: 06/22/2021 4:20:08 PM By: Donnamarie Poag Entered By: Donnamarie Poag on 06/22/2021 15:17:38 [name] ([id number]) -------------------------------------------------------------------------------- Wound Assessment Details Patient Name: [name]. Date of Service: 06/22/2021 2:30 PM Medical Record Number: [id number] Patient Account Number: [id number] Date of Birth/Sex: 12-13-1950 (70 y.o. F) Treating RN: Donnamarie Poag Primary Care Jamont Mellin: Rutherford Guys Other Clinician: Referring Tavares Levinson: Rutherford Guys Treating Joan Herschberger/Extender: Yaakov Guthrie in Treatment: 12 Wound Status Wound Number: 10 Primary Venous Leg Ulcer Etiology: Wound Location: Right, Proximal, Anterior Lower Leg Wound Status: Open Wounding Event: Gradually Appeared Comorbid Cataracts, Arrhythmia,  Hypertension, Type II Diabetes, Date Acquired: 06/08/2021 History: Osteoarthritis, Neuropathy Weeks Of Treatment: 2 Clustered Wound: No Photos Wound Measurements Length: (cm) 1 Width: (cm) 1 Depth: (cm) 0.1 Area: (cm) 0.785 Volume: (cm) 0.079 % Reduction in Area: -33.3% % Reduction in Volume: -33.9% Tunneling: No Undermining: No Wound Description Classification: Partial Thickness Exudate Amount: Medium Exudate Type: Serosanguineous Exudate Color: red, brown Foul Odor After Cleansing: No Slough/Fibrino Yes Wound Bed Granulation Amount: Medium (34-66%) Exposed Structure Granulation Quality: Red, Pink Fascia Exposed: No Necrotic Amount: Medium (34-66%) Fat Layer (Subcutaneous Tissue) Exposed: No Necrotic Quality: Eschar, Adherent Slough Tendon Exposed: No Muscle Exposed: No Joint Exposed: No Bone Exposed: No Limited to Skin Breakdown Treatment Notes Wound #10 (Lower Leg) Wound Laterality: Right, Anterior, Proximal Cleanser Wound Cleanser Discharge Instruction: Wash your hands with soap and water. Remove old dressing, discard into plastic bag and place into trash. Cleanse the wound with Wound Cleanser prior to applying a clean dressing using gauze sponges, not tissues or cotton balls. Do not scrub or use excessive force. Pat dry using gauze sponges, not tissue or cotton balls. SHALLA, BULLUCK ([id number]) Peri-Wound Care Topical Primary Dressing Silvercel Small 2x2 (in/in) Discharge Instruction: Apply Silvercel Small 2x2 (in/in) as instructed Secondary Dressing ABD Pad 5x9 (in/in) Discharge Instruction: Cover with ABD pad Secured With Coban Cohesive Bandage 4x5 (yds) Stretched Discharge Instruction: Apply Coban as directed. Kerlix Roll Sterile or Non-Sterile 6-ply 4.5x4 (yd/yd) Discharge Instruction: Apply Kerlix as directed Compression Wrap Compression Stockings Add-Ons Electronic Signature(s) Signed: 06/22/2021 4:20:08 PM By: Donnamarie Poag Entered By:  Donnamarie Poag on 06/22/2021 14:52:26 [name] ([id number]) -------------------------------------------------------------------------------- Wound Assessment Details Patient Name: [name]. Date of Service: 06/22/2021 2:30 PM Medical Record Number: [id number] Patient Account Number: [id number] Date of Birth/Sex: 20-Jan-1951 (70 y.o. F) Treating RN: Donnamarie Poag Primary Care Cindie Rajagopalan: Rutherford Guys Other Clinician: Referring Kimbra Marcelino: Rutherford Guys Treating Andrzej Scully/Extender: Yaakov Guthrie in Treatment: 12 Wound Status Wound Number: 11 Primary Venous Leg Ulcer  Etiology: Wound Location: Right Lower Leg Wound Status: Open Wounding Event: Gradually Appeared Comorbid Cataracts, Arrhythmia, Hypertension, Type II Diabetes, Date Acquired: 04/18/2021 History: Osteoarthritis, Neuropathy Weeks Of Treatment: 1 Clustered Wound: Yes Photos Wound Measurements Length: (cm) 5 Width: (cm) 5 Depth: (cm) 0.1 Area: (cm) 19.635 Volume: (cm) 1.963 % Reduction in Area: 33.3% % Reduction in Volume: 33.3% Tunneling: No Undermining: No Wound Description Classification: Full Thickness Without Exposed Support Structures Exudate Amount: Large Exudate Type: Serosanguineous Exudate Color: red, brown Foul Odor After Cleansing: No Slough/Fibrino Yes Wound Bed Granulation Amount: Large (67-100%) Exposed Structure Granulation Quality: Red Fascia Exposed: No Necrotic Amount: Small (1-33%) Fat Layer (Subcutaneous Tissue) Exposed: Yes Necrotic Quality: Adherent Slough Tendon Exposed: No Muscle Exposed: No Joint Exposed: No Bone Exposed: No Treatment Notes Wound #11 (Lower Leg) Wound Laterality: Right Cleanser Wound Cleanser Discharge Instruction: Wash your hands with soap and water. Remove old dressing, discard into plastic bag and place into trash. Cleanse the wound with Wound Cleanser prior to applying a clean dressing using gauze sponges, not tissues or cotton balls. Do  not scrub or use excessive force. Pat dry using gauze sponges, not tissue or cotton balls. [name], [name] ([id number]) Peri-Wound Care Topical Primary Dressing Silvercel Small 2x2 (in/in) Discharge Instruction: Apply Silvercel Small 2x2 (in/in) as instructed Secondary Dressing ABD Pad 5x9 (in/in) Discharge Instruction: Cover with ABD pad Secured With Coban Cohesive Bandage 4x5 (yds) Stretched Discharge Instruction: Apply Coban as directed. Kerlix Roll Sterile or Non-Sterile 6-ply 4.5x4 (yd/yd) Discharge Instruction: Apply Kerlix as directed Compression Wrap Compression Stockings Add-Ons Electronic Signature(s) Signed: 06/22/2021 4:20:08 PM By: Donnamarie Poag Entered By: Donnamarie Poag on 06/22/2021 14:51:00 [name] ([id number]) -------------------------------------------------------------------------------- Wound Assessment Details Patient Name: [name]. Date of Service: 06/22/2021 2:30 PM Medical Record Number: [id number] Patient Account Number: [id number] Date of Birth/Sex: 11/30/1950 (70 y.o. F) Treating RN: Donnamarie Poag Primary Care Birdia Jaycox: Rutherford Guys Other Clinician: Referring Pike Scantlebury: Rutherford Guys Treating Pheobe Sandiford/Extender: Yaakov Guthrie in Treatment: 12 Wound Status Wound Number: 9 Primary Venous Leg Ulcer Etiology: Wound Location: Right, Posterior Lower Leg Wound Status: Open Wounding Event: Gradually Appeared Comorbid Cataracts, Arrhythmia, Hypertension, Type II Diabetes, Date Acquired: 06/01/2021 History: Osteoarthritis, Neuropathy Weeks Of Treatment: 3 Clustered Wound: No Photos Wound Measurements Length: (cm) 0.5 Width: (cm) 1 Depth: (cm) 0.1 Area: (cm) 0.393 Volume: (cm) 0.039 % Reduction in Area: 84.8% % Reduction in Volume: 84.9% Tunneling: No Undermining: No Wound Description Classification: Full Thickness Without Exposed Support Structures Exudate Amount: Medium Exudate Type: Serosanguineous Exudate Color: red,  brown Foul Odor After Cleansing: No Slough/Fibrino Yes Wound Bed Granulation Amount: Large (67-100%) Exposed Structure Granulation Quality: Red, Pink Fascia Exposed: No Necrotic Amount: Small (1-33%) Fat Layer (Subcutaneous Tissue) Exposed: Yes Necrotic Quality: Adherent Slough Tendon Exposed: No Muscle Exposed: No Joint Exposed: No Bone Exposed: No Treatment Notes Wound #9 (Lower Leg) Wound Laterality: Right, Posterior Cleanser Wound Cleanser Discharge Instruction: Wash your hands with soap and water. Remove old dressing, discard into plastic bag and place into trash. Cleanse the wound with Wound Cleanser prior to applying a clean dressing using gauze sponges, not tissues or cotton balls. Do not scrub or use excessive force. Pat dry using gauze sponges, not tissue or cotton balls. [name], [name] ([id number]) Peri-Wound Care Topical Primary Dressing Silvercel Small 2x2 (in/in) Discharge Instruction: Apply Silvercel Small 2x2 (in/in) as instructed Secondary Dressing ABD Pad 5x9 (in/in) Discharge Instruction: Cover with ABD pad Secured With Coban Cohesive Bandage 4x5 (yds) Stretched Discharge Instruction: Apply Coban  as directed. Kerlix Roll Sterile or Non-Sterile 6-ply 4.5x4 (yd/yd) Discharge Instruction: Apply Kerlix as directed Compression Wrap Compression Stockings Add-Ons Electronic Signature(s) Signed: 06/22/2021 4:20:08 PM By: Donnamarie Poag Entered By: Donnamarie Poag on 06/22/2021 14:47:46 [name] ([id number]) -------------------------------------------------------------------------------- [place] Details Patient Name: [name]. Date of Service: 06/22/2021 2:30 PM Medical Record Number: [id number] Patient Account Number: [id number] Date of Birth/Sex: May 30, 1951 (70 y.o. F) Treating RN: Donnamarie Poag Primary Care Shayra Anton: Rutherford Guys Other Clinician: Referring Jozsef Wescoat: Rutherford Guys Treating Wess Baney/Extender: Yaakov Guthrie in Treatment:  12 Vital Signs Time Taken: 14:40 Temperature (F): 97.9 Height (in): 66 Pulse (bpm): 97 Weight (lbs): 332 Respiratory Rate (breaths/min): 16 Body Mass Index (BMI): 53.6 Blood Pressure (mmHg): 158/88 Reference Range: 80 - 120 mg / dl Electronic Signature(s) Signed: 06/22/2021 4:20:08 PM By: Donnamarie Poag Entered ByDonnamarie Poag on 06/22/2021 14:40:57

## 2021-06-22 NOTE — Progress Notes (Signed)
[name], [name] ([id number]) Visit Report for 06/22/2021 Chief Complaint Document Details Patient Name: [name], [name]. Date of Service: 06/22/2021 2:30 PM Medical Record Number: [id number] Patient Account Number: [id number] Date of Birth/Sex: September 06, 1950 (70 y.o. F) Treating RN: Donnamarie Poag Primary Care Provider: Rutherford Guys Other Clinician: Referring Provider: Rutherford Guys Treating Provider/Extender: Yaakov Guthrie in Treatment: 12 Information Obtained from: Patient Chief Complaint Bilateral lower extremity wounds Electronic Signature(s) Signed: 06/22/2021 4:12:11 PM By: Kalman Shan DO Entered By: Kalman Shan on 06/22/2021 16:00:18 [name] ([id number]) -------------------------------------------------------------------------------- Debridement Details Patient Name: [name]. Date of Service: 06/22/2021 2:30 PM Medical Record Number: [id number] Patient Account Number: [id number] Date of Birth/Sex: Dec 19, 1950 (70 y.o. F) Treating RN: Donnamarie Poag Primary Care Provider: Rutherford Guys Other Clinician: Referring Provider: Rutherford Guys Treating Provider/Extender: Yaakov Guthrie in Treatment: 12 Debridement Performed for Wound #11 Right Lower Leg Assessment: Performed By: Physician Kalman Shan, MD Debridement Type: Debridement Severity of Tissue Pre Debridement: Fat layer exposed Level of Consciousness (Pre- Awake and Alert procedure): Pre-procedure Verification/Time Out Yes - 15:10 Taken: Start Time: 15:11 Pain Control: Lidocaine Total Area Debrided (L x W): 2 (cm) x 2 (cm) = 4 (cm) Tissue and other material Viable, Skin: Dermis debrided: Level: Skin/Dermis Debridement Description: Selective/Open Wound Instrument: Curette Bleeding: Minimum Hemostasis Achieved: Pressure Response to Treatment: Procedure was tolerated well Level of Consciousness (Post- Awake and Alert procedure): Post Debridement Measurements of Total  Wound Length: (cm) 5 Width: (cm) 5 Depth: (cm) 0.1 Volume: (cm) 1.963 Character of Wound/Ulcer Post Debridement: Improved Severity of Tissue Post Debridement: Fat layer exposed Post Procedure Diagnosis Same as Pre-procedure Electronic Signature(s) Signed: 06/22/2021 4:12:11 PM By: Kalman Shan DO Signed: 06/22/2021 4:20:08 PM By: Donnamarie Poag Entered By: Donnamarie Poag on 06/22/2021 15:13:13 [name] ([id number]) -------------------------------------------------------------------------------- Debridement Details Patient Name: [name]. Date of Service: 06/22/2021 2:30 PM Medical Record Number: [id number] Patient Account Number: [id number] Date of Birth/Sex: 05/24/1951 (70 y.o. F) Treating RN: Donnamarie Poag Primary Care Provider: Rutherford Guys Other Clinician: Referring Provider: Rutherford Guys Treating Provider/Extender: Yaakov Guthrie in Treatment: 12 Debridement Performed for Wound #9 Right,Posterior Lower Leg Assessment: Performed By: Physician Kalman Shan, MD Debridement Type: Debridement Severity of Tissue Pre Debridement: Fat layer exposed Level of Consciousness (Pre- Awake and Alert procedure): Pre-procedure Verification/Time Out Yes - 15:10 Taken: Start Time: 15:14 Pain Control: Lidocaine Total Area Debrided (L x W): 0.5 (cm) x 1 (cm) = 0.5 (cm) Tissue and other material Viable, Skin: Dermis debrided: Level: Skin/Dermis Debridement Description: Selective/Open Wound Instrument: Curette Bleeding: Minimum Hemostasis Achieved: Pressure Response to Treatment: Procedure was tolerated well Level of Consciousness (Post- Awake and Alert procedure): Post Debridement Measurements of Total Wound Length: (cm) 0.5 Width: (cm) 1 Depth: (cm) 0.1 Volume: (cm) 0.039 Character of Wound/Ulcer Post Debridement: Improved Severity of Tissue Post Debridement: Fat layer exposed Post Procedure Diagnosis Same as Pre-procedure Electronic  Signature(s) Signed: 06/22/2021 4:12:11 PM By: Kalman Shan DO Signed: 06/22/2021 4:20:08 PM By: Donnamarie Poag Entered By: Donnamarie Poag on 06/22/2021 15:13:50 [name] ([id number]) -------------------------------------------------------------------------------- HPI Details Patient Name: [name]. Date of Service: 06/22/2021 2:30 PM Medical Record Number: [id number] Patient Account Number: [id number] Date of Birth/Sex: Sep 24, 1950 (70 y.o. F) Treating RN: Donnamarie Poag Primary Care Provider: Rutherford Guys Other Clinician: Referring Provider: Rutherford Guys Treating Provider/Extender: Yaakov Guthrie in Treatment: 12 History of Present Illness HPI Description: Admission 10/5 [name] is a 69 year old female with a past medical history of hypertension, venous insufficiency,  chronic diastolic heart failure and insulin-dependent type 2 diabetes that presents To the clinic for bilateral lower extremity wounds. She states that her right lower extremity developed a blister 1 week ago and she has been keeping the area covered with a bandaid. She states that when the Band-Aid came off it tore her skin. She subsequently developed pain, increased warmth and erythema to the right lower extremity. She denies purulent drainage. She has also developed an open wound to the left lower extremity that recently developed. She also has a small open wound to the proximal portion of the left great toe. This has been present since August and was evaluated by podiatry. It has not healed and she has not followed up with podiatry for this issue. 10/12; patient presents for follow-up. She states she has 2 days left on her antibiotics. She is tolerated this well. She reports improvement to her right lower extremity symptoms. She denies signs of infection. She has home health. 10/26; patient presents for follow-up. She has no issues or complaints today. The compression wraps slid down to her shin.  These were placed on Monday by home health. She has a couple of new open wounds due to this. She denies signs of infection. 11/2; patient presents for follow-up. The compression wraps had slid down to her shin again. The wraps taken off today do not appear to be appropriate material for 3 layer compression wrap. She currently denies signs of infection. 11/9; patient presents for follow-up. She was able to increase her diuretic over the past week. Again home health does not have appropriate materials for 3 layer compression. She currently denies signs of infection. 11/16; patient presents for follow-up. She has no issues or complaints today. She currently denies signs of infection. 11/30; patient presents for follow-up. The wrap continues to have a hard time staying in place. Overall she is doing well with no issues or complaints today. 12/7; patient presents for follow-up. She brought her juxta light compression today. She states however she cannot get these on daily. She has no issues or complaints today. 12/14; patient presents for follow-up. She states that the wrap rolled and created a new wound to her right lower extremity. Other than that she has no issues or complaints today. 12/21; patient presents for follow-up. The compression wraps continue to have a hard time staying in place. They again have rolled down her leg. 12/28; patient presents for follow-up. She has no issues or complaints today. Electronic Signature(s) Signed: 06/22/2021 4:12:11 PM By: Kalman Shan DO Entered By: Kalman Shan on 06/22/2021 16:00:48 [name] ([id number]) -------------------------------------------------------------------------------- Physical Exam Details Patient Name: [name]. Date of Service: 06/22/2021 2:30 PM Medical Record Number: [id number] Patient Account Number: [id number] Date of Birth/Sex: [birth year] (70 y.o. F) Treating RN: Donnamarie Poag Primary Care Provider: Rutherford Guys Other Clinician: Referring Provider: Rutherford Guys Treating Provider/Extender: Yaakov Guthrie in Treatment: 12 Constitutional . Cardiovascular . Psychiatric . Notes Patient has scattered wounds limited to skin breakdown to her right lower extremity. There are no open wounds to the left lower extremity. No signs of infection. Electronic Signature(s) Signed: 06/22/2021 4:12:11 PM By: Kalman Shan DO Entered By: Kalman Shan on 06/22/2021 16:01:39 [name] ([id number]) -------------------------------------------------------------------------------- Physician Orders Details Patient Name: [name]. Date of Service: 06/22/2021 2:30 PM Medical Record Number: [id number] Patient Account Number: [id number] Date of Birth/Sex: [birth year] (70 y.o. F) Treating RN: Donnamarie Poag Primary Care Provider: Rutherford Guys Other Clinician: Referring Provider: Rutherford Guys Treating  Provider/Extender: Yaakov Guthrie in Treatment: 12 Verbal / Phone Orders: No Diagnosis Coding Follow-up Appointments o Return Appointment in 1 week. o Nurse Visit as needed [facility] for wound care. May utilize formulary equivalent dressing for wound treatment orders unless otherwise specified. Home Health Nurse may visit [facility] to address patientos wound care needs. - SEE additional order for OT eval and treat for deconditioning, unable to don socks and needs to wear Vecro compression therapy o Scheduled days for dressing changes to be completed; exception, patient has scheduled wound care visit that day. o **Please direct any NON-WOUND related issues/requests for orders to patient's Primary Care Physician. **If current dressing causes regression in wound condition, may D/C ordered dressing product/s and apply Normal Saline Moist Dressing daily until next [place] or Other MD appointment. **[facility] in wound condition at [place] Bathing/ Shower/ Hygiene o May shower; gently cleanse wound with antibacterial soap, rinse and pat dry prior to dressing wounds o May shower with wound dressing protected with water repellent cover or cast protector. o No tub bath. Anesthetic (Use 'Patient Medications' Section for Anesthetic Order Entry) o Lidocaine applied to wound bed Edema Control - Lymphedema / Segmental Compressive Device / Other o Optional: One layer of unna paste to top of compression wrap (to act as an anchor). - please use or compression wrap will not stay up o Elevate leg(s) parallel to the floor when sitting. o DO YOUR BEST to sleep in the bed at night. DO NOT sleep in your recliner. Long hours of sitting in a recliner leads to swelling of the legs and/or potential wounds on your backside. o Other: - Kerlix and Coban left leg as well-wrap base of toes to behind knee-same as other leg bilateral wrap lower compression Non-Wound Condition o Additional non-wound orders/instructions: - Zinc oxide to any excoriated areas near drainage on right lower leg Additional Orders / Instructions o Follow Nutritious Diet and Increase Protein Intake o Other: - OT EVAL AND TREAT FOR DECONDITIONING-UNABLE TO DON SOCKS/needs to be able to don velcro wraps Medications-Please add to medication list. o Other: - follow with PCP about Lasix and Potassium tabs so that labwork may be done so meds can be adjusted Wound Treatment Wound #10 - Lower Leg Wound Laterality: Right, Anterior, Proximal Cleanser: Wound Cleanser 3 x Per Week/30 Days Discharge Instructions: Wash your hands with soap and water. Remove old dressing, discard into plastic bag and place into trash. Cleanse the wound with Wound Cleanser prior to applying a clean dressing using gauze sponges, not tissues or cotton balls. Do not scrub or use excessive force. Pat  dry using gauze sponges, not tissue or cotton balls. Primary Dressing: Silvercel Small 2x2 (in/in) 3 x Per Week/30 Days Discharge Instructions: Apply Silvercel Small 2x2 (in/in) as instructed Secondary Dressing: ABD Pad 5x9 (in/in) 3 x Per Week/30 Days Discharge Instructions: Cover with ABD pad Secured With: Coban Cohesive Bandage 4x5 (yds) Stretched 3 x Per Week/30 Days Discharge Instructions: Apply Coban as directed. Secured With: [employer] or Non-Sterile 6-ply 4.5x4 (yd/yd) 3 x Per Week/30 Days [name], [name] ([id number]) Discharge Instructions: Apply Kerlix as directed Wound #11 - Lower Leg Wound Laterality: Right Cleanser: Wound Cleanser 3 x Per Week/30 Days Discharge Instructions: Wash your hands with soap and water. Remove old dressing, discard into plastic bag and place into trash. Cleanse the wound with Wound  Cleanser prior to applying a clean dressing using gauze sponges, not tissues or cotton balls. Do not scrub or use excessive force. Pat dry using gauze sponges, not tissue or cotton balls. Primary Dressing: Silvercel Small 2x2 (in/in) 3 x Per Week/30 Days Discharge Instructions: Apply Silvercel Small 2x2 (in/in) as instructed Secondary Dressing: ABD Pad 5x9 (in/in) 3 x Per Week/30 Days Discharge Instructions: Cover with ABD pad Secured With: Coban Cohesive Bandage 4x5 (yds) Stretched 3 x Per Week/30 Days Discharge Instructions: Apply Coban as directed. Secured With: [employer] or Non-Sterile 6-ply 4.5x4 (yd/yd) 3 x Per Week/30 Days Discharge Instructions: Apply Kerlix as directed Wound #9 - Lower Leg Wound Laterality: Right, Posterior Cleanser: Wound Cleanser 3 x Per Week/30 Days Discharge Instructions: Wash your hands with soap and water. Remove old dressing, discard into plastic bag and place into trash. Cleanse the wound with Wound Cleanser prior to applying a clean dressing using gauze sponges, not tissues or cotton balls. Do not scrub or use excessive  force. Pat dry using gauze sponges, not tissue or cotton balls. Primary Dressing: Silvercel Small 2x2 (in/in) 3 x Per Week/30 Days Discharge Instructions: Apply Silvercel Small 2x2 (in/in) as instructed Secondary Dressing: ABD Pad 5x9 (in/in) 3 x Per Week/30 Days Discharge Instructions: Cover with ABD pad Secured With: Coban Cohesive Bandage 4x5 (yds) Stretched 3 x Per Week/30 Days Discharge Instructions: Apply Coban as directed. Secured With: [employer] or Non-Sterile 6-ply 4.5x4 (yd/yd) 3 x Per Week/30 Days Discharge Instructions: Apply Kerlix as directed Electronic Signature(s) Signed: 06/22/2021 4:12:11 PM By: Kalman Shan DO Entered By: Kalman Shan on 06/22/2021 16:11:48 [name] ([id number]) -------------------------------------------------------------------------------- Problem List Details Patient Name: [name]. Date of Service: 06/22/2021 2:30 PM Medical Record Number: [id number] Patient Account Number: [id number] Date of Birth/Sex: May 03, 1951 (70 y.o. F) Treating RN: Donnamarie Poag Primary Care Provider: Rutherford Guys Other Clinician: Referring Provider: Rutherford Guys Treating Provider/Extender: Yaakov Guthrie in Treatment: 12 Active Problems ICD-10 Encounter Code Description Active Date MDM Diagnosis L97.812 Non-pressure chronic ulcer of other part of right lower leg with fat layer 06/08/2021 No Yes exposed I87.331 Chronic venous hypertension (idiopathic) with ulcer and inflammation of 03/30/2021 No Yes right lower extremity I87.332 Chronic venous hypertension (idiopathic) with ulcer and inflammation of 03/30/2021 No Yes left lower extremity S91.302D Unspecified open wound, left foot, subsequent encounter 03/30/2021 No Yes Inactive Problems Resolved Problems Electronic Signature(s) Signed: 06/22/2021 4:12:11 PM By: Kalman Shan DO Entered By: Kalman Shan on 06/22/2021 16:00:13 [name]  ([id number]) -------------------------------------------------------------------------------- Progress Note Details Patient Name: [name]. Date of Service: 06/22/2021 2:30 PM Medical Record Number: [id number] Patient Account Number: [id number] Date of Birth/Sex: 03/28/1951 (70 y.o. F) Treating RN: Donnamarie Poag Primary Care Provider: Rutherford Guys Other Clinician: Referring Provider: Rutherford Guys Treating Provider/Extender: Yaakov Guthrie in Treatment: 12 Subjective Chief Complaint Information obtained from Patient Bilateral lower extremity wounds History of Present Illness (HPI) Admission 10/5 Ms. Kalei Meda is a 70 year old female with a past medical history of hypertension, venous insufficiency, chronic diastolic heart failure and insulin-dependent type 2 diabetes that presents To the clinic for bilateral lower extremity wounds. She states that her right lower extremity developed a blister 1 week ago and she has been keeping the area covered with a bandaid. She states that when the Band-Aid came off it tore her skin. She subsequently developed pain, increased warmth and erythema to the right lower extremity. She denies purulent drainage. She has also developed an open wound to the  left lower extremity that recently developed. She also has a small open wound to the proximal portion of the left great toe. This has been present since August and was evaluated by podiatry. It has not healed and she has not followed up with podiatry for this issue. 10/12; patient presents for follow-up. She states she has 2 days left on her antibiotics. She is tolerated this well. She reports improvement to her right lower extremity symptoms. She denies signs of infection. She has home health. 10/26; patient presents for follow-up. She has no issues or complaints today. The compression wraps slid down to her shin. These were placed on Monday by home health. She has a couple of new open wounds  due to this. She denies signs of infection. 11/2; patient presents for follow-up. The compression wraps had slid down to her shin again. The wraps taken off today do not appear to be appropriate material for 3 layer compression wrap. She currently denies signs of infection. 11/9; patient presents for follow-up. She was able to increase her diuretic over the past week. Again home health does not have appropriate materials for 3 layer compression. She currently denies signs of infection. 11/16; patient presents for follow-up. She has no issues or complaints today. She currently denies signs of infection. 11/30; patient presents for follow-up. The wrap continues to have a hard time staying in place. Overall she is doing well with no issues or complaints today. 12/7; patient presents for follow-up. She brought her juxta light compression today. She states however she cannot get these on daily. She has no issues or complaints today. 12/14; patient presents for follow-up. She states that the wrap rolled and created a new wound to her right lower extremity. Other than that she has no issues or complaints today. 12/21; patient presents for follow-up. The compression wraps continue to have a hard time staying in place. They again have rolled down her leg. 12/28; patient presents for follow-up. She has no issues or complaints today. Objective Constitutional Vitals Time Taken: 2:40 PM, Height: 66 in, Weight: 332 lbs, BMI: 53.6, Temperature: 97.9 F, Pulse: 97 bpm, Respiratory Rate: 16 breaths/min, Blood Pressure: 158/88 mmHg. General Notes: Patient has scattered wounds limited to skin breakdown to her right lower extremity. There are no open wounds to the left lower extremity. No signs of infection. Integumentary (Hair, Skin) Wound #10 status is Open. Original cause of wound was Gradually Appeared. The date acquired was: 06/08/2021. The wound has been in treatment 2 weeks. The wound is located on the  Right,Proximal,Anterior Lower Leg. The wound measures 1cm length x 1cm width x 0.1cm depth; [name], [name]. ([id number]) 0.785cm^2 area and 0.079cm^3 volume. The wound is limited to skin breakdown. There is no tunneling or undermining noted. There is a medium amount of serosanguineous drainage noted. There is medium (34-66%) red, pink granulation within the wound bed. There is a medium (34-66%) amount of necrotic tissue within the wound bed including Eschar and Adherent Slough. Wound #11 status is Open. Original cause of wound was Gradually Appeared. The date acquired was: 04/18/2021. The wound has been in treatment 1 weeks. The wound is located on the Right Lower Leg. The wound measures 5cm length x 5cm width x 0.1cm depth; 19.635cm^2 area and 1.963cm^3 volume. There is Fat Layer (Subcutaneous Tissue) exposed. There is no tunneling or undermining noted. There is a large amount of serosanguineous drainage noted. There is large (67-100%) red granulation within the wound bed. There is a small (1-33%) amount  of necrotic tissue within the wound bed including Adherent Slough. Wound #9 status is Open. Original cause of wound was Gradually Appeared. The date acquired was: 06/01/2021. The wound has been in treatment 3 weeks. The wound is located on the Right,Posterior Lower Leg. The wound measures 0.5cm length x 1cm width x 0.1cm depth; 0.393cm^2 area and 0.039cm^3 volume. There is Fat Layer (Subcutaneous Tissue) exposed. There is no tunneling or undermining noted. There is a medium amount of serosanguineous drainage noted. There is large (67-100%) red, pink granulation within the wound bed. There is a small (1-33%) amount of necrotic tissue within the wound bed including Adherent Slough. Assessment Active Problems ICD-10 Non-pressure chronic ulcer of other part of right lower leg with fat layer exposed Chronic venous hypertension (idiopathic) with ulcer and inflammation of right lower extremity Chronic  venous hypertension (idiopathic) with ulcer and inflammation of left lower extremity Unspecified open wound, left foot, subsequent encounter Patient's wound on the right lower extremity has shown some improvement in appearance. I recommended continuing silver alginate under Kerlix/Coban. No signs of infection. Due to persistent swelling patient has increased her diuretic use over the past several weeks. I asked her to follow-up with her primary care physician to discuss the increase and follow-up blood work. She expressed understanding. Procedures Wound #11 Pre-procedure diagnosis of Wound #11 is a Venous Leg Ulcer located on the Right Lower Leg .Severity of Tissue Pre Debridement is: Fat layer exposed. There was a Selective/Open Wound Skin/Dermis Debridement with a total area of 4 sq cm performed by Kalman Shan, MD. With the following instrument(s): Curette to remove Viable tissue/material. Material removed includes Skin: Dermis after achieving pain control using Lidocaine. A time out was conducted at 15:10, prior to the start of the procedure. A Minimum amount of bleeding was controlled with Pressure. The procedure was tolerated well. Post Debridement Measurements: 5cm length x 5cm width x 0.1cm depth; 1.963cm^3 volume. Character of Wound/Ulcer Post Debridement is improved. Severity of Tissue Post Debridement is: Fat layer exposed. Post procedure Diagnosis Wound #11: Same as Pre-Procedure Wound #9 Pre-procedure diagnosis of Wound #9 is a Venous Leg Ulcer located on the Right,Posterior Lower Leg .Severity of Tissue Pre Debridement is: Fat layer exposed. There was a Selective/Open Wound Skin/Dermis Debridement with a total area of 0.5 sq cm performed by Kalman Shan, MD. With the following instrument(s): Curette to remove Viable tissue/material. Material removed includes Skin: Dermis after achieving pain control using Lidocaine. A time out was conducted at 15:10, prior to the start of the  procedure. A Minimum amount of bleeding was controlled with Pressure. The procedure was tolerated well. Post Debridement Measurements: 0.5cm length x 1cm width x 0.1cm depth; 0.039cm^3 volume. Character of Wound/Ulcer Post Debridement is improved. Severity of Tissue Post Debridement is: Fat layer exposed. Post procedure Diagnosis Wound #9: Same as Pre-Procedure Wound #10 Pre-procedure diagnosis of Wound #10 is a Venous Leg Ulcer located on the Right,Proximal,Anterior Lower Leg . There was a Double Layer Compression Therapy Procedure by Donnamarie Poag, RN. Post procedure Diagnosis Wound #10: Same as Pre-Procedure There was a Double Layer Compression Therapy Procedure by Donnamarie Poag, RN. Post procedure Diagnosis Wound #: Same as Pre-Procedure [name], [name] ([id number]) Plan Follow-up Appointments: Return Appointment in 1 week. Nurse Visit as needed Home Health: [place]: - [facility] for wound care. May utilize formulary equivalent dressing for wound treatment orders unless otherwise specified. Home Health Nurse may visit [facility] to address patient s wound care needs. -  SEE additional order for OT eval and treat for deconditioning, unable to don socks and needs to wear Vecro compression therapy Scheduled days for dressing changes to be completed; exception, patient has scheduled wound care visit that day. **Please direct any NON-WOUND related issues/requests for orders to patient's Primary Care Physician. **If current dressing causes regression in wound condition, may D/C ordered dressing product/s and apply Normal Saline Moist Dressing daily until next [place] or Other MD appointment. **Notify [facility] of regression in wound condition at [place] Bathing/ Shower/ Hygiene: May shower; gently cleanse wound with antibacterial soap, rinse and pat dry prior to dressing wounds May shower with wound dressing protected  with water repellent cover or cast protector. No tub bath. Anesthetic (Use 'Patient Medications' Section for Anesthetic Order Entry): Lidocaine applied to wound bed Edema Control - Lymphedema / Segmental Compressive Device / Other: Optional: One layer of unna paste to top of compression wrap (to act as an anchor). - please use or compression wrap will not stay up Elevate leg(s) parallel to the floor when sitting. DO YOUR BEST to sleep in the bed at night. DO NOT sleep in your recliner. Long hours of sitting in a recliner leads to swelling of the legs and/or potential wounds on your backside. Other: - Kerlix and Coban left leg as well-wrap base of toes to behind knee-same as other leg bilateral wrap lower compression Non-Wound Condition: Additional non-wound orders/instructions: - Zinc oxide to any excoriated areas near drainage on right lower leg Additional Orders / Instructions: Follow Nutritious Diet and Increase Protein Intake Other: - OT EVAL AND TREAT FOR DECONDITIONING-UNABLE TO DON SOCKS/needs to be able to don velcro wraps Medications-Please add to medication list.: Other: - follow with PCP about Lasix and Potassium tabs so that labwork may be done so meds can be adjusted WOUND #10: - Lower Leg Wound Laterality: Right, Anterior, Proximal Cleanser: Wound Cleanser 3 x Per Week/30 Days Discharge Instructions: Wash your hands with soap and water. Remove old dressing, discard into plastic bag and place into trash. Cleanse the wound with Wound Cleanser prior to applying a clean dressing using gauze sponges, not tissues or cotton balls. Do not scrub or use excessive force. Pat dry using gauze sponges, not tissue or cotton balls. Primary Dressing: Silvercel Small 2x2 (in/in) 3 x Per Week/30 Days Discharge Instructions: Apply Silvercel Small 2x2 (in/in) as instructed Secondary Dressing: ABD Pad 5x9 (in/in) 3 x Per Week/30 Days Discharge Instructions: Cover with ABD pad Secured With: Coban  Cohesive Bandage 4x5 (yds) Stretched 3 x Per Week/30 Days Discharge Instructions: Apply Coban as directed. Secured With: [employer] or Non-Sterile 6-ply 4.5x4 (yd/yd) 3 x Per Week/30 Days Discharge Instructions: Apply Kerlix as directed WOUND #11: - Lower Leg Wound Laterality: Right Cleanser: Wound Cleanser 3 x Per Week/30 Days Discharge Instructions: Wash your hands with soap and water. Remove old dressing, discard into plastic bag and place into trash. Cleanse the wound with Wound Cleanser prior to applying a clean dressing using gauze sponges, not tissues or cotton balls. Do not scrub or use excessive force. Pat dry using gauze sponges, not tissue or cotton balls. Primary Dressing: Silvercel Small 2x2 (in/in) 3 x Per Week/30 Days Discharge Instructions: Apply Silvercel Small 2x2 (in/in) as instructed Secondary Dressing: ABD Pad 5x9 (in/in) 3 x Per Week/30 Days Discharge Instructions: Cover with ABD pad Secured With: Coban Cohesive Bandage 4x5 (yds) Stretched 3 x Per Week/30 Days Discharge Instructions: Apply Coban as  directed. Secured With: [employer] or Non-Sterile 6-ply 4.5x4 (yd/yd) 3 x Per Week/30 Days Discharge Instructions: Apply Kerlix as directed WOUND #9: - Lower Leg Wound Laterality: Right, Posterior Cleanser: Wound Cleanser 3 x Per Week/30 Days Discharge Instructions: Wash your hands with soap and water. Remove old dressing, discard into plastic bag and place into trash. Cleanse the wound with Wound Cleanser prior to applying a clean dressing using gauze sponges, not tissues or cotton balls. Do not scrub or use excessive force. Pat dry using gauze sponges, not tissue or cotton balls. Primary Dressing: Silvercel Small 2x2 (in/in) 3 x Per Week/30 Days Discharge Instructions: Apply Silvercel Small 2x2 (in/in) as instructed Secondary Dressing: ABD Pad 5x9 (in/in) 3 x Per Week/30 Days Discharge Instructions: Cover with ABD pad Secured With: Coban Cohesive  Bandage 4x5 (yds) Stretched 3 x Per Week/30 Days Discharge Instructions: Apply Coban as directed. Secured With: [employer] or Non-Sterile 6-ply 4.5x4 (yd/yd) 3 x Per Week/30 Days Discharge Instructions: Apply Kerlix as directed 1. Silver alginate under Kerlix/Coban 2. Follow-up in 1 week [name], [name] ([id number]) Electronic Signature(s) Signed: 06/22/2021 4:12:11 PM By: Kalman Shan DO Entered By: Kalman Shan on 06/22/2021 16:05:03 [name] ([id number]) -------------------------------------------------------------------------------- SuperBill Details Patient Name: [name]. Date of Service: 06/22/2021 Medical Record Number: [id number] Patient Account Number: [id number] Date of Birth/Sex: 1950-08-21 (70 y.o. F) Treating RN: Donnamarie Poag Primary Care Provider: Rutherford Guys Other Clinician: Referring Provider: Rutherford Guys Treating Provider/Extender: Yaakov Guthrie in Treatment: 12 Diagnosis Coding ICD-10 Codes Code Description I87.331 Chronic venous hypertension (idiopathic) with ulcer and inflammation of right lower extremity I87.332 Chronic venous hypertension (idiopathic) with ulcer and inflammation of left lower extremity S91.302D Unspecified open wound, left foot, subsequent encounter Facility Procedures CPT4 Code Description: [id number] - DEBRIDE WOUND 1ST 20 SQ CM OR < Modifier: Quantity: 1 CPT4 Code Description: ICD-10 Diagnosis Description I87.331 Chronic venous hypertension (idiopathic) with ulcer and inflammation of ri Modifier: ght lower extremi Quantity: ty Physician Procedures CPT4 Code Description: [id number] - WC PHYS LEVEL 3 - EST PT Modifier: Quantity: 1 CPT4 Code Description: ICD-10 Diagnosis Description I87.331 Chronic venous hypertension (idiopathic) with ulcer and inflammation of ri I87.332 Chronic venous hypertension (idiopathic) with ulcer and inflammation of le S91.302D Unspecified open wound,  left foot,  subsequent encounter Modifier: ght lower extremit ft lower extremity Quantity: y CPT4 Code Description: [id number] - WC PHYS DEBR WO ANESTH 20 SQ CM Modifier: Quantity: 1 CPT4 Code Description: ICD-10 Diagnosis Description I87.331 Chronic venous hypertension (idiopathic) with ulcer and inflammation of ri Modifier: ght lower extremit Quantity: y Engineer, maintenance) Signed: 06/22/2021 4:12:11 PM By: Kalman Shan DO Entered By: Kalman Shan on 06/22/2021 16:05:17

## 2021-06-29 ENCOUNTER — Encounter: Payer: Medicare Other | Admitting: Internal Medicine

## 2021-07-06 ENCOUNTER — Other Ambulatory Visit: Payer: Self-pay

## 2021-07-06 ENCOUNTER — Encounter: Payer: Medicare Other | Attending: Internal Medicine | Admitting: Internal Medicine

## 2021-07-06 DIAGNOSIS — L97812 Non-pressure chronic ulcer of other part of right lower leg with fat layer exposed: Secondary | ICD-10-CM | POA: Insufficient documentation

## 2021-07-06 DIAGNOSIS — I87333 Chronic venous hypertension (idiopathic) with ulcer and inflammation of bilateral lower extremity: Secondary | ICD-10-CM | POA: Diagnosis not present

## 2021-07-06 DIAGNOSIS — S91302A Unspecified open wound, left foot, initial encounter: Secondary | ICD-10-CM | POA: Diagnosis not present

## 2021-07-06 DIAGNOSIS — I87331 Chronic venous hypertension (idiopathic) with ulcer and inflammation of right lower extremity: Secondary | ICD-10-CM | POA: Diagnosis not present

## 2021-07-06 DIAGNOSIS — I110 Hypertensive heart disease with heart failure: Secondary | ICD-10-CM | POA: Diagnosis not present

## 2021-07-06 DIAGNOSIS — I5032 Chronic diastolic (congestive) heart failure: Secondary | ICD-10-CM | POA: Insufficient documentation

## 2021-07-06 DIAGNOSIS — X58XXXA Exposure to other specified factors, initial encounter: Secondary | ICD-10-CM | POA: Insufficient documentation

## 2021-07-06 DIAGNOSIS — Z794 Long term (current) use of insulin: Secondary | ICD-10-CM | POA: Diagnosis not present

## 2021-07-06 NOTE — Progress Notes (Signed)
DELAYNE, SANZO ([id number]) Visit Report for 07/06/2021 Arrival Information Details Patient Name: [name], [name]. Date of Service: 07/06/2021 2:30 PM Medical Record Number: [id number] Patient Account Number: [id number] Date of Birth/Sex: 12-28-1950 (71 y.o. F) Treating RN: Donnamarie Poag Primary Care Nesiah Jump: Rutherford Guys Other Clinician: Referring Steffon Gladu: Rutherford Guys Treating Arnett Galindez/Extender: Yaakov Guthrie in Treatment: 14 Visit Information History Since Last Visit Added or deleted any medications: No Patient Arrived: [name] Had a fall or experienced change in No Arrival Time: 14:41 activities of daily living that may affect Accompanied By: self risk of falls: Transfer Assistance: None Hospitalized since last visit: No Patient Identification Verified: Yes Has Dressing in Place as Prescribed: Yes Secondary Verification Process Completed: Yes Has Compression in Place as Prescribed: Yes Patient Requires Transmission-Based No Pain Present Now: No Precautions: Patient Has Alerts: Yes Patient Alerts: Patient on Blood Thinner DIABETIC ELIQUIS Electronic Signature(s) Signed: 07/06/2021 4:15:42 PM By: Donnamarie Poag Entered By: Donnamarie Poag on 07/06/2021 14:44:42 [name] ([id number]) -------------------------------------------------------------------------------- Clinic Level of Care Assessment Details Patient Name: [name]. Date of Service: 07/06/2021 2:30 PM Medical Record Number: [id number] Patient Account Number: [id number] Date of Birth/Sex: [birth year] (71 y.o. F) Treating RN: Donnamarie Poag Primary Care Samantha Olivera: Rutherford Guys Other Clinician: Referring Wilene Pharo: Rutherford Guys Treating Kentravious Lipford/Extender: Yaakov Guthrie in Treatment: 14 Clinic Level of Care Assessment Items TOOL 4 Quantity Score []  - Use when only an EandM is performed on FOLLOW-UP visit 0 ASSESSMENTS - Nursing Assessment / Reassessment []  - Reassessment of Co-morbidities  (includes updates in patient status) 0 []  - 0 Reassessment of Adherence to Treatment Plan ASSESSMENTS - Wound and Skin Assessment / Reassessment []  - Simple Wound Assessment / Reassessment - one wound 0 X- 3 5 Complex Wound Assessment / Reassessment - multiple wounds []  - 0 Dermatologic / Skin Assessment (not related to wound area) ASSESSMENTS - Focused Assessment []  - Circumferential Edema Measurements - multi extremities 0 []  - 0 Nutritional Assessment / Counseling / Intervention []  - 0 Lower Extremity Assessment (monofilament, tuning fork, pulses) []  - 0 Peripheral Arterial Disease Assessment (using hand held doppler) ASSESSMENTS - Ostomy and/or Continence Assessment and Care []  - Incontinence Assessment and Management 0 []  - 0 Ostomy Care Assessment and Management (repouching, etc.) PROCESS - Coordination of Care X - Simple Patient / Family Education for ongoing care 1 15 []  - 0 Complex (extensive) Patient / Family Education for ongoing care []  - 0 Staff obtains Programmer, systems, Records, Test Results / Process Orders X- 1 10 Staff telephones HHA, Nursing Homes / Clarify orders / etc []  - 0 Routine Transfer to another Facility (non-emergent condition) []  - 0 Routine Hospital Admission (non-emergent condition) []  - 0 New Admissions / Biomedical engineer / Ordering NPWT, Apligraf, etc. []  - 0 Emergency Hospital Admission (emergent condition) X- 1 10 Simple Discharge Coordination []  - 0 Complex (extensive) Discharge Coordination PROCESS - Special Needs []  - Pediatric / Minor Patient Management 0 []  - 0 Isolation Patient Management []  - 0 Hearing / Language / Visual special needs []  - 0 Assessment of Community assistance (transportation, D/C planning, etc.) []  - 0 Additional assistance / Altered mentation []  - 0 Support Surface(s) Assessment (bed, cushion, seat, etc.) INTERVENTIONS - Wound Cleansing / Measurement AURIANA, SCALIA. ([id number]) []  - 0 Simple Wound  Cleansing - one wound X- 3 5 Complex Wound Cleansing - multiple wounds X- 1 5 Wound Imaging (photographs - any number of wounds) []  - 0 Wound Tracing (instead of photographs) []  -  0 Simple Wound Measurement - one wound X- 3 5 Complex Wound Measurement - multiple wounds INTERVENTIONS - Wound Dressings X - Small Wound Dressing one or multiple wounds 3 10 []  - 0 Medium Wound Dressing one or multiple wounds []  - 0 Large Wound Dressing one or multiple wounds []  - 0 Application of Medications - topical []  - 0 Application of Medications - injection INTERVENTIONS - Miscellaneous []  - External ear exam 0 []  - 0 Specimen Collection (cultures, biopsies, blood, body fluids, etc.) []  - 0 Specimen(s) / Culture(s) sent or taken to Lab for analysis []  - 0 Patient Transfer (multiple staff / Civil Service fast streamer / Similar devices) []  - 0 Simple Staple / Suture removal (25 or less) []  - 0 Complex Staple / Suture removal (26 or more) []  - 0 Hypo / Hyperglycemic Management (close monitor of Blood Glucose) []  - 0 Ankle / Brachial Index (ABI) - do not check if billed separately X- 1 5 Vital Signs Has the patient been seen at the hospital within the last three years: Yes Total Score: 120 Level Of Care: New/Established - Level 4 Electronic Signature(s) Signed: 07/06/2021 4:57:04 PM By: Donnamarie Poag Entered By: Donnamarie Poag on 07/06/2021 16:56:29 [name] ([id number]) -------------------------------------------------------------------------------- Complex / Palliative Patient Assessment Details Patient Name: [name]. Date of Service: 07/06/2021 2:30 PM Medical Record Number: [id number] Patient Account Number: [id number] Date of Birth/Sex: 1951-01-30 (71 y.o. F) Treating RN: Donnamarie Poag Primary Care Jansen Goodpasture: Rutherford Guys Other Clinician: Referring Jaelah Hauth: Rutherford Guys Treating Merdith Boyd/Extender: Yaakov Guthrie in Treatment: 14 Palliative Management Criteria Complex Wound  Management Criteria Patient has remarkable or complex co-morbidities requiring medications or treatments that extend wound healing times. Examples: o Diabetes mellitus with chronic renal failure or end stage renal disease requiring dialysis o Advanced or poorly controlled rheumatoid arthritis o Diabetes mellitus and end stage chronic obstructive pulmonary disease o Active cancer with current chemo- or radiation therapy comorbidities/homebound/chronic lymphedema Care Approach Wound Care Plan: Complex Wound Management Electronic Signature(s) Signed: 07/06/2021 4:54:31 PM By: Donnamarie Poag Signed: 07/06/2021 4:56:06 PM By: Kalman Shan DO Entered By: Donnamarie Poag on 07/06/2021 16:54:30 [name] ([id number]) -------------------------------------------------------------------------------- Compression Therapy Details Patient Name: [name]. Date of Service: 07/06/2021 2:30 PM Medical Record Number: [id number] Patient Account Number: [id number] Date of Birth/Sex: 1950-10-19 (71 y.o. F) Treating RN: Donnamarie Poag Primary Care Spero Gunnels: Rutherford Guys Other Clinician: Referring Amier Hoyt: Rutherford Guys Treating Bertel Venard/Extender: Yaakov Guthrie in Treatment: 14 Compression Therapy Performed for Wound Assessment: Wound #11 Right Lower Leg Performed By: Junius Argyle, RN Compression Type: Double Layer Post Procedure Diagnosis Same as Pre-procedure Electronic Signature(s) Signed: 07/06/2021 4:15:42 PM By: Donnamarie Poag Entered By: Donnamarie Poag on 07/06/2021 15:01:08 [name] ([id number]) -------------------------------------------------------------------------------- Encounter Discharge Information Details Patient Name: [name]. Date of Service: 07/06/2021 2:30 PM Medical Record Number: [id number] Patient Account Number: [id number] Date of Birth/Sex: 11-04-1950 (71 y.o. F) Treating RN: Donnamarie Poag Primary Care Danniela Mcbrearty: Rutherford Guys Other  Clinician: Referring Jaydalee Bardwell: Rutherford Guys Treating Alyas Creary/Extender: Yaakov Guthrie in Treatment: 14 Encounter Discharge Information Items Discharge Condition: Stable Ambulatory Status: Walker Discharge Destination: Home Transportation: Private Auto Accompanied By: self Schedule Follow-up Appointment: Yes Clinical Summary of Care: Electronic Signature(s) Signed: 07/06/2021 4:15:42 PM By: Donnamarie Poag Entered By: Donnamarie Poag on 07/06/2021 15:40:44 [name] ([id number]) -------------------------------------------------------------------------------- Lower Extremity Assessment Details Patient Name: [name]. Date of Service: 07/06/2021 2:30 PM Medical Record Number: [id number] Patient Account Number: [id number] Date of Birth/Sex: [birth year] (70  y.o. F) Treating RN: Donnamarie Poag Primary Care Shiva Sahagian: Rutherford Guys Other Clinician: Referring Corinthian Kemler: Rutherford Guys Treating Matin Mattioli/Extender: Yaakov Guthrie in Treatment: 14 Edema Assessment Assessed: [Left: Yes] [Right: Yes] Edema: [Left: Yes] [Right: Yes] Calf Left: Right: Point of Measurement: 29 cm From Medial Instep 39 cm 51 cm Ankle Left: Right: Point of Measurement: 10 cm From Medial Instep 21 cm 21.5 cm Vascular Assessment Pulses: Dorsalis Pedis Palpable: [Left:Yes] [Right:Yes] Electronic Signature(s) Signed: 07/06/2021 4:15:42 PM By: Donnamarie Poag Entered ByDonnamarie Poag on 07/06/2021 14:58:21 [name] ([id number]) -------------------------------------------------------------------------------- Multi Wound Chart Details Patient Name: [name]. Date of Service: 07/06/2021 2:30 PM Medical Record Number: [id number] Patient Account Number: [id number] Date of Birth/Sex: 07/27/1950 (71 y.o. 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 Ulcer Comorbid History: Cataracts, Arrhythmia, Cataracts, Arrhythmia, Cataracts, Arrhythmia, Hypertension, Type II Diabetes, Hypertension, Type II Diabetes, Hypertension, Type II Diabetes, Osteoarthritis, Neuropathy Osteoarthritis, Neuropathy Osteoarthritis, Neuropathy Date Acquired: 06/08/2021 04/18/2021 06/01/2021 Weeks of Treatment: 4 3 5  Wound Status: Open Open Open Clustered Wound: No Yes No Measurements L x W x D (cm) 3x1x0.1 1x1x0.1 2x2x0.1 Area (cm) : 2.356 0.785 3.142 Volume (cm) : 0.236 0.079 0.314 % Reduction in Area: -300.00% 97.30% -21.20% % Reduction in Volume: -300.00% 97.30% -21.20% Classification: Partial Thickness Full Thickness Without Exposed Full Thickness Without Exposed Support Structures Support Structures Exudate Amount: Medium Large Large Exudate Type: Serosanguineous Serosanguineous Serous Exudate Color: red, brown red, brown amber Granulation Amount: Large (67-100%) Large (67-100%) Medium (34-66%) Granulation Quality: Red, Pink Red Red, Pink Necrotic Amount: Small (1-33%) Small (1-33%) Medium (34-66%) Exposed Structures: Fat Layer (Subcutaneous Tissue): Fat Layer (Subcutaneous Tissue): Fat Layer (Subcutaneous Tissue): Yes Yes Yes Fascia: No Fascia: No Fascia: No Tendon: No Tendon: No Tendon: No Muscle: No Muscle: No Muscle: No Joint: No Joint: No Joint: No Bone: No Bone: No Bone: No Treatment Notes Electronic Signature(s) Signed: 07/06/2021 4:15:42 PM By: Donnamarie Poag Entered By: Donnamarie Poag on 07/06/2021 15:00:44 [name]  ([id number]) -------------------------------------------------------------------------------- [place] Details Patient Name: [name]. Date of Service: 07/06/2021 2:30 PM Medical Record Number: [id number] Patient Account Number: [id number] Date of Birth/Sex: 06-08-1951 (71 y.o. F) Treating RN: Donnamarie Poag Primary Care Paschal Blanton: Rutherford Guys Other Clinician: Referring Elkin Belfield: Rutherford Guys Treating Sentoria Brent/Extender: Yaakov Guthrie in Treatment: 14 Active Inactive Wound/Skin Impairment Nursing Diagnoses: Impaired tissue integrity Goals: Patient/caregiver will verbalize understanding of skin care regimen Date Initiated: 03/30/2021 Date Inactivated: 04/27/2021 Target Resolution Date: 03/30/2021 Goal Status: Met Ulcer/skin breakdown will have a volume reduction of 30% by week 4 Date Initiated: 03/30/2021 Date Inactivated: 05/11/2021 Target Resolution Date: 04/30/2021 Goal Status: Met Ulcer/skin breakdown will have a volume reduction of 50% by week 8 Date Initiated: 03/30/2021 Date Inactivated: 05/25/2021 Target Resolution Date: 05/30/2021 Goal Status: Met Ulcer/skin breakdown will have a volume reduction of 80% by week 12 Date Initiated: 03/30/2021 Target Resolution Date: 06/30/2021 Goal Status: Active Ulcer/skin breakdown will heal within 14 weeks Date Initiated: 03/30/2021 Target Resolution Date: 07/31/2021 Goal Status: Active Interventions: Assess patient/caregiver ability to obtain necessary supplies Assess patient/caregiver ability to perform ulcer/skin  care regimen upon admission and as needed Assess ulceration(s) every visit Treatment Activities: Patient referred to home care : 03/30/2021 Referred to DME Nylan Nakatani for dressing supplies : 03/30/2021 Skin care regimen initiated : 03/30/2021 Notes: Electronic Signature(s) Signed: 07/06/2021 4:15:42 PM By: Donnamarie Poag Entered By: Donnamarie Poag on 07/06/2021 14:59:52 [name]  ([id number]) -------------------------------------------------------------------------------- Non-Wound Condition Assessment Details Patient Name: [name]. Date of Service: 07/06/2021 2:30 PM Medical Record Number: [id number] Patient Account Number: [id number] Date of Birth/Sex: [birth year] (71 y.o. F) Treating RN: Donnamarie Poag Primary Care Jahanna Raether: Rutherford Guys Other Clinician: Referring Kayley Zeiders: Rutherford Guys Treating Xzavier Swinger/Extender: Yaakov Guthrie in Treatment: 14 Non-Wound Condition: Condition: Lymphedema Location: Leg Side: Left Photos Electronic Signature(s) Signed: 07/06/2021 4:15:42 PM By: Donnamarie Poag Entered By: Donnamarie Poag on 07/06/2021 14:56:19 [name] ([id number]) -------------------------------------------------------------------------------- Pain Assessment Details Patient Name: [name]. Date of Service: 07/06/2021 2:30 PM Medical Record Number: [id number] Patient Account Number: [id number] Date of Birth/Sex: September 06, 1950 (71 y.o. F) Treating RN: Donnamarie Poag Primary Care Charnese Federici: Rutherford Guys Other Clinician: Referring Jennet Scroggin: Rutherford Guys Treating Taci Sterling/Extender: Yaakov Guthrie in Treatment: 14 Active Problems Location of Pain Severity and Description of Pain Patient Has Paino No Site Locations Rate the pain. Current Pain Level: 0 Pain Management and Medication Current Pain Management: Electronic Signature(s) Signed: 07/06/2021 4:15:42 PM By: Donnamarie Poag Entered By: Donnamarie Poag on 07/06/2021 14:47:07 [name] ([id number]) -------------------------------------------------------------------------------- Patient/Caregiver Education Details Patient Name: [name]. Date of Service: 07/06/2021 2:30 PM Medical Record Number: [id number] Patient Account Number: [id number] Date of Birth/Gender: March 26, 1951 (71 y.o. F) Treating RN: Donnamarie Poag Primary Care Physician: Rutherford Guys Other Clinician: Referring  Physician: Rutherford Guys Treating Physician/Extender: Yaakov Guthrie in Treatment: 14 Education Assessment Education Provided To: Patient Education Topics Provided Basic Hygiene: Wound/Skin Impairment: Engineer, maintenance) Signed: 07/06/2021 4:15:42 PM By: Donnamarie Poag Entered By: Donnamarie Poag on 07/06/2021 15:24:46 [name] ([id number]) -------------------------------------------------------------------------------- Wound Assessment Details Patient Name: [name]. Date of Service: 07/06/2021 2:30 PM Medical Record Number: [id number] Patient Account Number: [id number] Date of Birth/Sex: [birth year] (71 y.o. F) Treating RN: Donnamarie Poag Primary Care Filipe Greathouse: Rutherford Guys Other Clinician: Referring Akshay Spang: Rutherford Guys Treating Kinzlee Selvy/Extender: Yaakov Guthrie in Treatment: 14 Wound Status Wound Number: 10 Primary Venous Leg Ulcer Etiology: Wound Location: Right, Proximal, Anterior Lower Leg Wound Status: Open Wounding Event: Gradually Appeared Comorbid Cataracts, Arrhythmia, Hypertension, Type II Diabetes, Date Acquired: 06/08/2021 History: Osteoarthritis, Neuropathy Weeks Of Treatment: 4 Clustered Wound: No Photos Wound Measurements Length: (cm) 3 Width: (cm) 1 Depth: (cm) 0.1 Area: (cm) 2.356 Volume: (cm) 0.236 % Reduction in Area: -300% % Reduction in Volume: -300% Tunneling: No Undermining: No Wound Description Classification: Partial Thickness Exudate Amount: Medium Exudate Type: Serosanguineous Exudate Color: red, brown Foul Odor After Cleansing: No Slough/Fibrino Yes Wound Bed Granulation Amount: Large (67-100%) Exposed Structure Granulation Quality: Red, Pink Fascia Exposed: No Necrotic Amount: Small (1-33%) Fat Layer (Subcutaneous Tissue) Exposed: Yes Necrotic Quality: Adherent Slough Tendon Exposed: No Muscle Exposed: No Joint Exposed: No Bone Exposed: No Treatment Notes Wound #10 (Lower Leg) Wound Laterality:  Right, Anterior, Proximal Cleanser Wound Cleanser Discharge Instruction: Wash your hands with soap and water. Remove old dressing, discard into plastic bag and place into trash. Cleanse the wound with Wound Cleanser prior to applying a clean dressing using gauze sponges, not tissues or cotton balls. Do not scrub or use excessive force. Pat dry using gauze sponges, not tissue or cotton balls. NEEMA, BARREIRA ([id number]) Peri-Wound Care  Topical Gentamicin Discharge Instruction: IN OFFICE ONLY-Apply as directed by Duell Holdren. Primary Dressing Hydrofera Blue Ready Transfer Foam, 4x5 (in/in) Discharge Instruction: Apply Hydrofera Blue Ready to wound bed as directed Secondary Dressing Zetuvit Plus Silicone Non-bordered 5x5 (in/in) Discharge Instruction: OR ALTERNATIVE ABSORBANT PAD Secured With Coban Cohesive Bandage 4x5 (yds) Stretched Discharge Instruction: Apply Coban as directed. Kerlix Roll Sterile or Non-Sterile 6-ply 4.5x4 (yd/yd) Discharge Instruction: Apply Kerlix as directed Compression Wrap Compression Stockings Add-Ons Electronic Signature(s) Signed: 07/06/2021 4:15:42 PM By: Donnamarie Poag Entered By: Donnamarie Poag on 07/06/2021 14:54:30 [name] ([id number]) -------------------------------------------------------------------------------- Wound Assessment Details Patient Name: [name]. Date of Service: 07/06/2021 2:30 PM Medical Record Number: [id number] Patient Account Number: [id number] Date of Birth/Sex: 02/08/1951 (71 y.o. F) Treating RN: Donnamarie Poag Primary Care Shloka Baldridge: Rutherford Guys Other Clinician: Referring Yashira Offenberger: Rutherford Guys Treating Simra Fiebig/Extender: Yaakov Guthrie in Treatment: 14 Wound Status Wound Number: 11 Primary Venous Leg Ulcer Etiology: Wound Location: Right Lower Leg Wound Status: Open Wounding Event: Gradually Appeared Comorbid Cataracts, Arrhythmia, Hypertension, Type II Diabetes, Date Acquired: 04/18/2021 History:  Osteoarthritis, Neuropathy Weeks Of Treatment: 3 Clustered Wound: Yes Photos Wound Measurements Length: (cm) 1 Width: (cm) 1 Depth: (cm) 0.1 Area: (cm) 0.785 Volume: (cm) 0.079 % Reduction in Area: 97.3% % Reduction in Volume: 97.3% Tunneling: No Undermining: No Wound Description Classification: Full Thickness Without Exposed Support Structures Exudate Amount: Large Exudate Type: Serosanguineous Exudate Color: red, brown Foul Odor After Cleansing: No Slough/Fibrino Yes Wound Bed Granulation Amount: Large (67-100%) Exposed Structure Granulation Quality: Red Fascia Exposed: No Necrotic Amount: Small (1-33%) Fat Layer (Subcutaneous Tissue) Exposed: Yes Necrotic Quality: Adherent Slough Tendon Exposed: No Muscle Exposed: No Joint Exposed: No Bone Exposed: No Treatment Notes Wound #11 (Lower Leg) Wound Laterality: Right Cleanser Wound Cleanser Discharge Instruction: Wash your hands with soap and water. Remove old dressing, discard into plastic bag and place into trash. Cleanse the wound with Wound Cleanser prior to applying a clean dressing using gauze sponges, not tissues or cotton balls. Do not scrub or use excessive force. Pat dry using gauze sponges, not tissue or cotton balls. RIAN, KOON ([id number]) Peri-Wound Care Topical Gentamicin Discharge Instruction: IN OFFICE ONLY-Apply as directed by Janeah Kovacich. Primary Dressing Hydrofera Blue Ready Transfer Foam, 4x5 (in/in) Discharge Instruction: Apply Hydrofera Blue Ready to wound bed as directed Secondary Dressing Zetuvit Plus Silicone Non-bordered 5x5 (in/in) Discharge Instruction: OR ALTERNATIVE ABSORBANT PAD Secured With Coban Cohesive Bandage 4x5 (yds) Stretched Discharge Instruction: Apply Coban as directed. Kerlix Roll Sterile or Non-Sterile 6-ply 4.5x4 (yd/yd) Discharge Instruction: Apply Kerlix as directed Compression Wrap Compression Stockings Add-Ons Electronic Signature(s) Signed: 07/06/2021  4:15:42 PM By: Donnamarie Poag Entered By: Donnamarie Poag on 07/06/2021 14:55:25 [name] ([id number]) -------------------------------------------------------------------------------- Wound Assessment Details Patient Name: [name]. Date of Service: 07/06/2021 2:30 PM Medical Record Number: [id number] Patient Account Number: [id number] Date of Birth/Sex: 04/26/1951 (71 y.o. F) Treating RN: Donnamarie Poag Primary Care Kinsley Holderman: Rutherford Guys Other Clinician: Referring Angelli Baruch: Rutherford Guys Treating Fishel Wamble/Extender: Yaakov Guthrie in Treatment: 14 Wound Status Wound Number: 9 Primary Venous Leg Ulcer Etiology: Wound Location: Right, Posterior Lower Leg Wound Status: Open Wounding Event: Gradually Appeared Comorbid Cataracts, Arrhythmia, Hypertension, Type II Diabetes, Date Acquired: 06/01/2021 History: Osteoarthritis, Neuropathy Weeks Of Treatment: 5 Clustered Wound: No Photos Wound Measurements Length: (cm) 2 Width: (cm) 2 Depth: (cm) 0.1 Area: (cm) 3.142 Volume: (cm) 0.314 % Reduction in Area: -21.2% % Reduction in Volume: -21.2% Tunneling: No Undermining: No Wound Description Classification: Full Thickness Without  Exposed Support Structures Exudate Amount: Large Exudate Type: Serous Exudate Color: amber Foul Odor After Cleansing: No Slough/Fibrino Yes Wound Bed Granulation Amount: Medium (34-66%) Exposed Structure Granulation Quality: Red, Pink Fascia Exposed: No Necrotic Amount: Medium (34-66%) Fat Layer (Subcutaneous Tissue) Exposed: Yes Necrotic Quality: Adherent Slough Tendon Exposed: No Muscle Exposed: No Joint Exposed: No Bone Exposed: No Treatment Notes Wound #9 (Lower Leg) Wound Laterality: Right, Posterior Cleanser Wound Cleanser Discharge Instruction: Wash your hands with soap and water. Remove old dressing, discard into plastic bag and place into trash. Cleanse the wound with Wound Cleanser prior to applying a clean dressing using  gauze sponges, not tissues or cotton balls. Do not scrub or use excessive force. Pat dry using gauze sponges, not tissue or cotton balls. ATLEIGH, GRUEN ([id number]) Peri-Wound Care Topical Gentamicin Discharge Instruction: IN OFFICE ONLY-Apply as directed by Irlanda Croghan. Primary Dressing Hydrofera Blue Ready Transfer Foam, 4x5 (in/in) Discharge Instruction: Apply Hydrofera Blue Ready to wound bed as directed Secondary Dressing Zetuvit Plus Silicone Non-bordered 5x5 (in/in) Discharge Instruction: OR ALTERNATIVE ABSORBANT PAD Secured With Coban Cohesive Bandage 4x5 (yds) Stretched Discharge Instruction: Apply Coban as directed. Kerlix Roll Sterile or Non-Sterile 6-ply 4.5x4 (yd/yd) Discharge Instruction: Apply Kerlix as directed Compression Wrap Compression Stockings Add-Ons Electronic Signature(s) Signed: 07/06/2021 4:15:42 PM By: Donnamarie Poag Entered By: Donnamarie Poag on 07/06/2021 14:58:58 [name] ([id number]) -------------------------------------------------------------------------------- [place] Details Patient Name: [name]. Date of Service: 07/06/2021 2:30 PM Medical Record Number: [id number] Patient Account Number: [id number] Date of Birth/Sex: [birth year] (71 y.o. F) Treating RN: Donnamarie Poag Primary Care Younique Casad: Rutherford Guys Other Clinician: Referring Libbi Towner: Rutherford Guys Treating Nadeen Shipman/Extender: Yaakov Guthrie in Treatment: 14 Vital Signs Time Taken: 14:45 Temperature (F): 98.0 Height (in): 66 Pulse (bpm): 103 Weight (lbs): 332 Respiratory Rate (breaths/min): 18 Body Mass Index (BMI): 53.6 Blood Pressure (mmHg): 169/95 Reference Range: 80 - 120 mg / dl Electronic Signature(s) Signed: 07/06/2021 4:15:42 PM By: Donnamarie Poag Entered ByDonnamarie Poag on 07/06/2021 14:46:45

## 2021-07-06 NOTE — Progress Notes (Signed)
JESI, JURGENS ([id number]) Visit Report for 07/06/2021 Chief Complaint Document Details Patient Name: [name], [name]. Date of Service: 07/06/2021 2:30 PM Medical Record Number: [id number] Patient Account Number: [id number] Date of Birth/Sex: [birth year] (71 y.o. F) Treating RN: Donnamarie Poag Primary Care Provider: Rutherford Guys Other Clinician: Referring Provider: Rutherford Guys Treating Provider/Extender: Yaakov Guthrie in Treatment: 14 Information Obtained from: Patient Chief Complaint Bilateral lower extremity wounds Electronic Signature(s) Signed: 07/06/2021 3:25:53 PM By: Kalman Shan DO Entered By: Kalman Shan on 07/06/2021 15:22:25 [name] ([id number]) -------------------------------------------------------------------------------- HPI Details Patient Name: [name]. Date of Service: 07/06/2021 2:30 PM Medical Record Number: [id number] Patient Account Number: [id number] Date of Birth/Sex: [birth year] (71 y.o. F) Treating RN: Donnamarie Poag Primary Care Provider: Rutherford Guys Other Clinician: Referring Provider: Rutherford Guys Treating Provider/Extender: Yaakov Guthrie in Treatment: 14 History of Present Illness HPI Description: Admission 10/5 Ms. Drucella Karbowski is a 71 year old female with a past medical history of hypertension, venous insufficiency, chronic diastolic heart failure and insulin-dependent type 2 diabetes that presents To the clinic for bilateral lower extremity wounds. She states that her right lower extremity developed a blister 1 week ago and she has been keeping the area covered with a bandaid. She states that when the Band-Aid came off it tore her skin. She subsequently developed pain, increased warmth and erythema to the right lower extremity. She denies purulent drainage. She has also developed an open wound to the left lower extremity that recently developed. She also has a small open wound to the proximal portion of the  left great toe. This has been present since August and was evaluated by podiatry. It has not healed and she has not followed up with podiatry for this issue. 10/12; patient presents for follow-up. She states she has 2 days left on her antibiotics. She is tolerated this well. She reports improvement to her right lower extremity symptoms. She denies signs of infection. She has home health. 10/26; patient presents for follow-up. She has no issues or complaints today. The compression wraps slid down to her shin. These were placed on Monday by home health. She has a couple of new open wounds due to this. She denies signs of infection. 11/2; patient presents for follow-up. The compression wraps had slid down to her shin again. The wraps taken off today do not appear to be appropriate material for 3 layer compression wrap. She currently denies signs of infection. 11/9; patient presents for follow-up. She was able to increase her diuretic over the past week. Again home health does not have appropriate materials for 3 layer compression. She currently denies signs of infection. 11/16; patient presents for follow-up. She has no issues or complaints today. She currently denies signs of infection. 11/30; patient presents for follow-up. The wrap continues to have a hard time staying in place. Overall she is doing well with no issues or complaints today. 12/7; patient presents for follow-up. She brought her juxta light compression today. She states however she cannot get these on daily. She has no issues or complaints today. 12/14; patient presents for follow-up. She states that the wrap rolled and created a new wound to her right lower extremity. Other than that she has no issues or complaints today. 12/21; patient presents for follow-up. The compression wraps continue to have a hard time staying in place. They again have rolled down her leg. 12/28; patient presents for follow-up. She has no issues or  complaints today. 1/11; patient presents for follow-up. She missed  her last clinic appointment due to transportation issues. She has no issues or complaints today. Electronic Signature(s) Signed: 07/06/2021 3:25:53 PM By: Kalman Shan DO Entered By: Kalman Shan on 07/06/2021 15:22:48 [name] ([id number]) -------------------------------------------------------------------------------- Physical Exam Details Patient Name: [name]. Date of Service: 07/06/2021 2:30 PM Medical Record Number: [id number] Patient Account Number: [id number] Date of Birth/Sex: 08-29-1950 (71 y.o. F) Treating RN: Donnamarie Poag Primary Care Provider: Rutherford Guys Other Clinician: Referring Provider: Rutherford Guys Treating Provider/Extender: Yaakov Guthrie in Treatment: 14 Constitutional . Cardiovascular . Psychiatric . Notes Patient has scattered wounds limited to skin breakdown to her right lower extremity. There are no open wounds to the left lower extremity. No signs of infection. Electronic Signature(s) Signed: 07/06/2021 3:25:53 PM By: Kalman Shan DO Entered By: Kalman Shan on 07/06/2021 15:23:10 [name] ([id number]) -------------------------------------------------------------------------------- Physician Orders Details Patient Name: [name]. Date of Service: 07/06/2021 2:30 PM Medical Record Number: [id number] Patient Account Number: [id number] Date of Birth/Sex: 09-25-1950 (71 y.o. F) Treating RN: Donnamarie Poag Primary Care Provider: Rutherford Guys Other Clinician: Referring Provider: Rutherford Guys Treating Provider/Extender: Yaakov Guthrie in Treatment: 87 Verbal / Phone Orders: No Diagnosis Coding Follow-up Appointments o Return Appointment in 1 week. o Nurse Visit as needed [place] for wound care. May utilize formulary equivalent  dressing for wound treatment orders unless otherwise specified. Home Health Nurse may visit PRN to address patientos wound care needs. - SEE additional order for OT eval and treat for deconditioning, unable to don socks and needs to wear Vecro compression therapy o Scheduled days for dressing changes to be completed; exception, patient has scheduled wound care visit that day. o **Please direct any NON-WOUND related issues/requests for orders to patient's Primary Care Physician. **If current dressing causes regression in wound condition, may D/C ordered dressing product/s and apply Normal Saline Moist Dressing daily until next [place] or Other MD appointment. **Notify [facility] of regression in wound condition at [place] Bathing/ Shower/ Hygiene o May shower; gently cleanse wound with antibacterial soap, rinse and pat dry prior to dressing wounds o May shower with wound dressing protected with water repellent cover or cast protector. o No tub bath. Anesthetic (Use 'Patient Medications' Section for Anesthetic Order Entry) o Lidocaine applied to wound bed Edema Control - Lymphedema / Segmental Compressive Device / Other o Optional: One layer of unna paste to top of compression wrap (to act as an anchor). - please use or compression wrap will not stay up-RIGHT LEG o Patient to wear own Velcro compression garment. Remove compression stockings every night before going to bed and put on every morning when getting up. - START JUXTELITE VELCRO WRAP TO LEFT LEG-assist with don and doff and education as well as assistive device of sock donner o Elevate leg(s) parallel to the floor when sitting. o DO YOUR BEST to sleep in the bed at night. DO NOT sleep in your recliner. Long hours of sitting in a recliner leads to swelling of the legs and/or potential wounds on your backside. o Other: - Kerlix and Coban RIGHT LEG ONLY-wrap base  of toes to behind knee-same as other leg bilateral wrap lower compression Non-Wound Condition o Additional non-wound orders/instructions: - Zinc oxide to any excoriated areas near drainage on right lower leg Additional Orders / Instructions o Follow Nutritious Diet and Increase Protein Intake o Other: - OT  EVAL AND TREAT FOR DECONDITIONING-UNABLE TO DON SOCKS/needs to be able to don velcro wraps Medications-Please add to medication list. o Other: - follow with PCP about Lasix and Potassium tabs so that labwork may be done so meds can be adjusted Wound Treatment Wound #10 - Lower Leg Wound Laterality: Right, Anterior, Proximal Cleanser: Wound Cleanser 3 x Per Week/30 Days Discharge Instructions: Wash your hands with soap and water. Remove old dressing, discard into plastic bag and place into trash. Cleanse the wound with Wound Cleanser prior to applying a clean dressing using gauze sponges, not tissues or cotton balls. Do not scrub or use excessive force. Pat dry using gauze sponges, not tissue or cotton balls. Topical: Gentamicin 3 x Per Week/30 Days Discharge Instructions: IN OFFICE ONLY-Apply as directed by provider. Primary Dressing: Hydrofera Blue Ready Transfer Foam, 4x5 (in/in) 3 x Per Week/30 Days Discharge Instructions: Apply Hydrofera Blue Ready to wound bed as directed Secondary Dressing: Zetuvit Plus Silicone Non-bordered 5x5 (in/in) 3 x Per Week/30 Days KERISHA, GOUGHNOUR ([id number]) Discharge Instructions: OR ALTERNATIVE ABSORBANT PAD Secured With: Coban Cohesive Bandage 4x5 (yds) Stretched 3 x Per Week/30 Days Discharge Instructions: Apply Coban as directed. Secured With: [employer] or Non-Sterile 6-ply 4.5x4 (yd/yd) 3 x Per Week/30 Days Discharge Instructions: Apply Kerlix as directed Wound #11 - Lower Leg Wound Laterality: Right Cleanser: Wound Cleanser 3 x Per Week/30 Days Discharge Instructions: Wash your hands with soap and water. Remove old dressing,  discard into plastic bag and place into trash. Cleanse the wound with Wound Cleanser prior to applying a clean dressing using gauze sponges, not tissues or cotton balls. Do not scrub or use excessive force. Pat dry using gauze sponges, not tissue or cotton balls. Topical: Gentamicin 3 x Per Week/30 Days Discharge Instructions: IN OFFICE ONLY-Apply as directed by provider. Primary Dressing: Hydrofera Blue Ready Transfer Foam, 4x5 (in/in) 3 x Per Week/30 Days Discharge Instructions: Apply Hydrofera Blue Ready to wound bed as directed Secondary Dressing: Zetuvit Plus Silicone Non-bordered 5x5 (in/in) 3 x Per Week/30 Days Discharge Instructions: OR ALTERNATIVE ABSORBANT PAD Secured With: Coban Cohesive Bandage 4x5 (yds) Stretched 3 x Per Week/30 Days Discharge Instructions: Apply Coban as directed. Secured With: [employer] or Non-Sterile 6-ply 4.5x4 (yd/yd) 3 x Per Week/30 Days Discharge Instructions: Apply Kerlix as directed Wound #9 - Lower Leg Wound Laterality: Right, Posterior Cleanser: Wound Cleanser 3 x Per Week/30 Days Discharge Instructions: Wash your hands with soap and water. Remove old dressing, discard into plastic bag and place into trash. Cleanse the wound with Wound Cleanser prior to applying a clean dressing using gauze sponges, not tissues or cotton balls. Do not scrub or use excessive force. Pat dry using gauze sponges, not tissue or cotton balls. Topical: Gentamicin 3 x Per Week/30 Days Discharge Instructions: IN OFFICE ONLY-Apply as directed by provider. Primary Dressing: Hydrofera Blue Ready Transfer Foam, 4x5 (in/in) 3 x Per Week/30 Days Discharge Instructions: Apply Hydrofera Blue Ready to wound bed as directed Secondary Dressing: Zetuvit Plus Silicone Non-bordered 5x5 (in/in) 3 x Per Week/30 Days Discharge Instructions: OR ALTERNATIVE ABSORBANT PAD Secured With: Coban Cohesive Bandage 4x5 (yds) Stretched 3 x Per Week/30 Days Discharge Instructions: Apply Coban as  directed. Secured With: [employer] or Non-Sterile 6-ply 4.5x4 (yd/yd) 3 x Per Week/30 Days Discharge Instructions: Apply Kerlix as directed Electronic Signature(s) Signed: 07/06/2021 3:25:53 PM By: Kalman Shan DO Entered By: Kalman Shan on 07/06/2021 15:25:29 [name] ([id number]) -------------------------------------------------------------------------------- Problem List Details Patient Name: [name],  Miranda F. Date of Service: 07/06/2021 2:30 PM Medical Record Number: [id number] Patient Account Number: [id number] Date of Birth/Sex: 10-19-1950 (70 y.o. F) Treating RN: Donnamarie Poag Primary Care Provider: Rutherford Guys Other Clinician: Referring Provider: Rutherford Guys Treating Provider/Extender: Yaakov Guthrie in Treatment: 14 Active Problems ICD-10 Encounter Code Description Active Date MDM Diagnosis L97.812 Non-pressure chronic ulcer of other part of right lower leg with fat layer 06/08/2021 No Yes exposed I87.331 Chronic venous hypertension (idiopathic) with ulcer and inflammation of 03/30/2021 No Yes right lower extremity I87.332 Chronic venous hypertension (idiopathic) with ulcer and inflammation of 03/30/2021 No Yes left lower extremity S91.302D Unspecified open wound, left foot, subsequent encounter 03/30/2021 No Yes Inactive Problems Resolved Problems Electronic Signature(s) Signed: 07/06/2021 3:25:53 PM By: Kalman Shan DO Entered By: Kalman Shan on 07/06/2021 15:22:19 [name] ([id number]) -------------------------------------------------------------------------------- Progress Note Details Patient Name: [name]. Date of Service: 07/06/2021 2:30 PM Medical Record Number: [id number] Patient Account Number: [id number] Date of Birth/Sex: May 02, 1951 (71 y.o. F) Treating RN: Donnamarie Poag Primary Care Provider: Rutherford Guys Other Clinician: Referring Provider: Rutherford Guys Treating Provider/Extender: Yaakov Guthrie  in Treatment: 14 Subjective Chief Complaint Information obtained from Patient Bilateral lower extremity wounds History of Present Illness (HPI) Admission 10/5 Ms. Journey [place] is a 71 year old female with a past medical history of hypertension, venous insufficiency, chronic diastolic heart failure and insulin-dependent type 2 diabetes that presents To the clinic for bilateral lower extremity wounds. She states that her right lower extremity developed a blister 1 week ago and she has been keeping the area covered with a bandaid. She states that when the Band-Aid came off it tore her skin. She subsequently developed pain, increased warmth and erythema to the right lower extremity. She denies purulent drainage. She has also developed an open wound to the left lower extremity that recently developed. She also has a small open wound to the proximal portion of the left great toe. This has been present since August and was evaluated by podiatry. It has not healed and she has not followed up with podiatry for this issue. 10/12; patient presents for follow-up. She states she has 2 days left on her antibiotics. She is tolerated this well. She reports improvement to her right lower extremity symptoms. She denies signs of infection. She has home health. 10/26; patient presents for follow-up. She has no issues or complaints today. The compression wraps slid down to her shin. These were placed on Monday by home health. She has a couple of new open wounds due to this. She denies signs of infection. 11/2; patient presents for follow-up. The compression wraps had slid down to her shin again. The wraps taken off today do not appear to be appropriate material for 3 layer compression wrap. She currently denies signs of infection. 11/9; patient presents for follow-up. She was able to increase her diuretic over the past week. Again home health does not have appropriate materials for 3 layer compression. She currently  denies signs of infection. 11/16; patient presents for follow-up. She has no issues or complaints today. She currently denies signs of infection. 11/30; patient presents for follow-up. The wrap continues to have a hard time staying in place. Overall she is doing well with no issues or complaints today. 12/7; patient presents for follow-up. She brought her juxta light compression today. She states however she cannot get these on daily. She has no issues or complaints today. 12/14; patient presents for follow-up. She states that the wrap rolled and created  a new wound to her right lower extremity. Other than that she has no issues or complaints today. 12/21; patient presents for follow-up. The compression wraps continue to have a hard time staying in place. They again have rolled down her leg. 12/28; patient presents for follow-up. She has no issues or complaints today. 1/11; patient presents for follow-up. She missed her last clinic appointment due to transportation issues. She has no issues or complaints today. Objective Constitutional Vitals Time Taken: 2:45 PM, Height: 66 in, Weight: 332 lbs, BMI: 53.6, Temperature: 98.0 F, Pulse: 103 bpm, Respiratory Rate: 18 breaths/min, Blood Pressure: 169/95 mmHg. General Notes: Patient has scattered wounds limited to skin breakdown to her right lower extremity. There are no open wounds to the left lower extremity. No signs of infection. Integumentary (Hair, Skin) KENNETH, LAX F. ([id number]) Wound #10 status is Open. Original cause of wound was Gradually Appeared. The date acquired was: 06/08/2021. The wound has been in treatment 4 weeks. The wound is located on the Right,Proximal,Anterior Lower Leg. The wound measures 3cm length x 1cm width x 0.1cm depth; 2.356cm^2 area and 0.236cm^3 volume. There is Fat Layer (Subcutaneous Tissue) exposed. There is no tunneling or undermining noted. There is a medium amount of serosanguineous drainage noted. There is  large (67-100%) red, pink granulation within the wound bed. There is a small (1-33%) amount of necrotic tissue within the wound bed including Adherent Slough. Wound #11 status is Open. Original cause of wound was Gradually Appeared. The date acquired was: 04/18/2021. The wound has been in treatment 3 weeks. The wound is located on the Right Lower Leg. The wound measures 1cm length x 1cm width x 0.1cm depth; 0.785cm^2 area and 0.079cm^3 volume. There is Fat Layer (Subcutaneous Tissue) exposed. There is no tunneling or undermining noted. There is a large amount of serosanguineous drainage noted. There is large (67-100%) red granulation within the wound bed. There is a small (1-33%) amount of necrotic tissue within the wound bed including Adherent Slough. Wound #9 status is Open. Original cause of wound was Gradually Appeared. The date acquired was: 06/01/2021. The wound has been in treatment 5 weeks. The wound is located on the Right,Posterior Lower Leg. The wound measures 2cm length x 2cm width x 0.1cm depth; 3.142cm^2 area and 0.314cm^3 volume. There is Fat Layer (Subcutaneous Tissue) exposed. There is no tunneling or undermining noted. There is a large amount of serous drainage noted. There is medium (34-66%) red, pink granulation within the wound bed. There is a medium (34-66%) amount of necrotic tissue within the wound bed including Adherent Slough. 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 No signs of infection on exam. I recommended switching to [facility] Blue and gentamicin under the wrap. The left lower extremity has no open wounds and she has her  juxta light compression today. She has a device to help put on a sock and is willing to start using her juxta light on this leg. Procedures Wound #11 Pre-procedure diagnosis of Wound #11 is a Venous Leg Ulcer located on the Right Lower Leg . There was a Double Layer Compression Therapy Procedure by Donnamarie Poag, RN. Post procedure Diagnosis Wound #11: Same as Pre-Procedure Plan Follow-up Appointments: Return Appointment in 1 week. Nurse Visit  as needed Home Health: [place] for wound care. May utilize formulary equivalent dressing for wound treatment orders unless otherwise specified. Home Health Nurse may visit PRN to address patient s wound care needs. - SEE additional order for OT eval and treat for deconditioning, unable to don socks and needs to wear Vecro compression therapy Scheduled days for dressing changes to be completed; exception, patient has scheduled wound care visit that day. **Please direct any NON-WOUND related issues/requests for orders to patient's Primary Care Physician. **If current dressing causes regression in wound condition, may D/C ordered dressing product/s and apply Normal Saline Moist Dressing daily until next [place] or Other MD appointment. **Notify [facility] of regression in wound condition at [place] Bathing/ Shower/ Hygiene: May shower; gently cleanse wound with antibacterial soap, rinse and pat dry prior to dressing wounds May shower with wound dressing protected with water repellent cover or cast protector. No tub bath. Anesthetic (Use 'Patient Medications' Section for Anesthetic Order Entry): Lidocaine applied to wound bed Edema Control - Lymphedema / Segmental Compressive Device / Other: Optional: One layer of unna paste to top of compression wrap (to act as an anchor). - please use or compression wrap will not stay  up-RIGHT LEG JADIE, [place]. ([id number]) Patient to wear own Velcro compression garment. Remove compression stockings every night before going to bed and put on every morning when getting up. - START JUXTELITE VELCRO WRAP TO LEFT LEG-assist with don and doff and education as well as assistive device of sock donner Elevate leg(s) parallel to the floor when sitting. DO YOUR BEST to sleep in the bed at night. DO NOT sleep in your recliner. Long hours of sitting in a recliner leads to swelling of the legs and/or potential wounds on your backside. Other: - Kerlix and Coban RIGHT LEG ONLY-wrap base of toes to behind knee-same as other leg bilateral wrap lower compression Non-Wound Condition: Additional non-wound orders/instructions: - Zinc oxide to any excoriated areas near drainage on right lower leg Additional Orders / Instructions: Follow Nutritious Diet and Increase Protein Intake Other: - OT EVAL AND TREAT FOR DECONDITIONING-UNABLE TO DON SOCKS/needs to be able to don velcro wraps Medications-Please add to medication list.: Other: - follow with PCP about Lasix and Potassium tabs so that labwork may be done so meds can be adjusted WOUND #10: - Lower Leg Wound Laterality: Right, Anterior, Proximal Cleanser: Wound Cleanser 3 x Per Week/30 Days Discharge Instructions: Wash your hands with soap and water. Remove old dressing, discard into plastic bag and place into trash. Cleanse the wound with Wound Cleanser prior to applying a clean dressing using gauze sponges, not tissues or cotton balls. Do not scrub or use excessive force. Pat dry using gauze sponges, not tissue or cotton balls. Topical: Gentamicin 3 x Per Week/30 Days Discharge Instructions: IN OFFICE ONLY-Apply as directed by provider. Primary Dressing: Hydrofera Blue Ready Transfer Foam, 4x5 (in/in) 3 x Per Week/30 Days Discharge Instructions: Apply Hydrofera Blue Ready to wound bed as directed Secondary Dressing: Zetuvit Plus Silicone  Non-bordered 5x5 (in/in) 3 x Per Week/30 Days Discharge Instructions: OR ALTERNATIVE ABSORBANT PAD Secured With: Coban Cohesive Bandage 4x5 (yds) Stretched 3 x Per Week/30 Days Discharge Instructions: Apply Coban as directed. Secured With: [employer] or Non-Sterile 6-ply 4.5x4 (yd/yd) 3 x Per Week/30 Days Discharge Instructions: Apply Kerlix as directed WOUND #11: - Lower Leg Wound Laterality: Right Cleanser: Wound  Cleanser 3 x Per Week/30 Days Discharge Instructions: Wash your hands with soap and water. Remove old dressing, discard into plastic bag and place into trash. Cleanse the wound with Wound Cleanser prior to applying a clean dressing using gauze sponges, not tissues or cotton balls. Do not scrub or use excessive force. Pat dry using gauze sponges, not tissue or cotton balls. Topical: Gentamicin 3 x Per Week/30 Days Discharge Instructions: IN OFFICE ONLY-Apply as directed by provider. Primary Dressing: Hydrofera Blue Ready Transfer Foam, 4x5 (in/in) 3 x Per Week/30 Days Discharge Instructions: Apply Hydrofera Blue Ready to wound bed as directed Secondary Dressing: Zetuvit Plus Silicone Non-bordered 5x5 (in/in) 3 x Per Week/30 Days Discharge Instructions: OR ALTERNATIVE ABSORBANT PAD Secured With: Coban Cohesive Bandage 4x5 (yds) Stretched 3 x Per Week/30 Days Discharge Instructions: Apply Coban as directed. Secured With: [employer] or Non-Sterile 6-ply 4.5x4 (yd/yd) 3 x Per Week/30 Days Discharge Instructions: Apply Kerlix as directed WOUND #9: - Lower Leg Wound Laterality: Right, Posterior Cleanser: Wound Cleanser 3 x Per Week/30 Days Discharge Instructions: Wash your hands with soap and water. Remove old dressing, discard into plastic bag and place into trash. Cleanse the wound with Wound Cleanser prior to applying a clean dressing using gauze sponges, not tissues or cotton balls. Do not scrub or use excessive force. Pat dry using gauze sponges, not tissue or  cotton balls. Topical: Gentamicin 3 x Per Week/30 Days Discharge Instructions: IN OFFICE ONLY-Apply as directed by provider. Primary Dressing: Hydrofera Blue Ready Transfer Foam, 4x5 (in/in) 3 x Per Week/30 Days Discharge Instructions: Apply Hydrofera Blue Ready to wound bed as directed Secondary Dressing: Zetuvit Plus Silicone Non-bordered 5x5 (in/in) 3 x Per Week/30 Days Discharge Instructions: OR ALTERNATIVE ABSORBANT PAD Secured With: Coban Cohesive Bandage 4x5 (yds) Stretched 3 x Per Week/30 Days Discharge Instructions: Apply Coban as directed. Secured With: [employer] or Non-Sterile 6-ply 4.5x4 (yd/yd) 3 x Per Week/30 Days Discharge Instructions: Apply Kerlix as directed 1. Gentamicin and Hydrofera Blue under Kerlix/Coban 2. Follow-up in 1 week 3. Juxta light compression to the left lower extremity Electronic Signature(s) Signed: 07/06/2021 3:25:53 PM By: Kalman Shan DO Entered By: Kalman Shan on 07/06/2021 15:24:22 [name] ([id number]) -------------------------------------------------------------------------------- SuperBill Details Patient Name: [name]. Date of Service: 07/06/2021 Medical Record Number: [id number] Patient Account Number: [id number] Date of Birth/Sex: 10/24/1950 (71 y.o. 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 (idiopathic) with ulcer and inflammation of right lower extremity I87.332 Chronic venous hypertension (idiopathic) with ulcer and inflammation of left lower extremity S91.302D Unspecified open wound, left foot, subsequent encounter Facility Procedures CPT4 Code: [id number] Description: 99214 - WOUND CARE  VISIT-LEV 4 EST PT Modifier: Quantity: 1 Physician Procedures CPT4 Code Description: [id number] 99213 - WC PHYS LEVEL 3 - EST PT Modifier: Quantity: 1 CPT4 Code Description: ICD-10 Diagnosis Description Y77.412 Non-pressure chronic ulcer of other part of right lower leg with fat lay I87.331 Chronic venous hypertension (idiopathic) with ulcer and inflammation of Modifier: er exposed right lower extremit Quantity: y Engineer, maintenance) Signed: 07/06/2021 4:56:49 PM By: Donnamarie Poag Previous Signature: 07/06/2021 3:25:53 PM Version By: Kalman Shan DO Entered ByDonnamarie Poag on 07/06/2021 16:56:48

## 2021-07-13 ENCOUNTER — Encounter (HOSPITAL_BASED_OUTPATIENT_CLINIC_OR_DEPARTMENT_OTHER): Payer: Medicare Other | Admitting: Internal Medicine

## 2021-07-13 ENCOUNTER — Other Ambulatory Visit: Payer: Self-pay

## 2021-07-13 DIAGNOSIS — I87333 Chronic venous hypertension (idiopathic) with ulcer and inflammation of bilateral lower extremity: Secondary | ICD-10-CM | POA: Diagnosis not present

## 2021-07-13 DIAGNOSIS — L97812 Non-pressure chronic ulcer of other part of right lower leg with fat layer exposed: Secondary | ICD-10-CM | POA: Diagnosis not present

## 2021-07-13 DIAGNOSIS — I87331 Chronic venous hypertension (idiopathic) with ulcer and inflammation of right lower extremity: Secondary | ICD-10-CM | POA: Diagnosis not present

## 2021-07-13 NOTE — Progress Notes (Signed)
CHRISTIANNE, ZACHER ([id number]) Visit Report for 07/13/2021 Arrival Information Details Patient Name: [name], [name]. Date of Service: 07/13/2021 2:30 PM Medical Record Number: [id number] Patient Account Number: [id number] Date of Birth/Sex: 05-Dec-1950 (71 y.o. F) Treating RN: Donnamarie Poag Primary Care Tiney Zipper: Rutherford Guys Other Clinician: Referring Kirsti Mcalpine: Rutherford Guys Treating Alondra Sahni/Extender: Yaakov Guthrie in Treatment: 15 Visit Information History Since Last Visit Added or deleted any medications: No Patient Arrived: [name] Had a fall or experienced change in No Arrival Time: 15:09 activities of daily living that may affect Accompanied By: self risk of falls: Transfer Assistance: None Hospitalized since last visit: No Patient Identification Verified: Yes Has Dressing in Place as Prescribed: Yes Secondary Verification Process Completed: Yes Has Compression in Place as Prescribed: Yes Patient Requires Transmission-Based No Pain Present Now: No Precautions: Patient Has Alerts: Yes Patient Alerts: Patient on Blood Thinner DIABETIC ELIQUIS Electronic Signature(s) Signed: 07/13/2021 4:37:11 PM By: Donnamarie Poag Entered By: Donnamarie Poag on 07/13/2021 15:10:13 [name] ([id number]) -------------------------------------------------------------------------------- Clinic Level of Care Assessment Details Patient Name: [name]. Date of Service: 07/13/2021 2:30 PM Medical Record Number: [id number] Patient Account Number: [id number] Date of Birth/Sex: [birth year] (71 y.o. F) Treating RN: Donnamarie Poag Primary Care Morganne Haile: Rutherford Guys Other Clinician: Referring Kyley Solow: Rutherford Guys Treating Jill Ruppe/Extender: Yaakov Guthrie in Treatment: 15 Clinic Level of Care Assessment Items TOOL 4 Quantity Score []  - Use when only an EandM is performed on FOLLOW-UP visit 0 ASSESSMENTS - Nursing Assessment / Reassessment []  - Reassessment of Co-morbidities  (includes updates in patient status) 0 []  - 0 Reassessment of Adherence to Treatment Plan ASSESSMENTS - Wound and Skin Assessment / Reassessment []  - Simple Wound Assessment / Reassessment - one wound 0 X- 3 5 Complex Wound Assessment / Reassessment - multiple wounds []  - 0 Dermatologic / Skin Assessment (not related to wound area) ASSESSMENTS - Focused Assessment []  - Circumferential Edema Measurements - multi extremities 0 []  - 0 Nutritional Assessment / Counseling / Intervention []  - 0 Lower Extremity Assessment (monofilament, tuning fork, pulses) []  - 0 Peripheral Arterial Disease Assessment (using hand held doppler) ASSESSMENTS - Ostomy and/or Continence Assessment and Care []  - Incontinence Assessment and Management 0 []  - 0 Ostomy Care Assessment and Management (repouching, etc.) PROCESS - Coordination of Care X - Simple Patient / Family Education for ongoing care 1 15 []  - 0 Complex (extensive) Patient / Family Education for ongoing care []  - 0 Staff obtains Programmer, systems, Records, Test Results / Process Orders X- 1 10 Staff telephones HHA, Nursing Homes / Clarify orders / etc []  - 0 Routine Transfer to another Facility (non-emergent condition) []  - 0 Routine Hospital Admission (non-emergent condition) []  - 0 New Admissions / Biomedical engineer / Ordering NPWT, Apligraf, etc. []  - 0 Emergency Hospital Admission (emergent condition) X- 1 10 Simple Discharge Coordination []  - 0 Complex (extensive) Discharge Coordination PROCESS - Special Needs []  - Pediatric / Minor Patient Management 0 []  - 0 Isolation Patient Management []  - 0 Hearing / Language / Visual special needs []  - 0 Assessment of Community assistance (transportation, D/C planning, etc.) []  - 0 Additional assistance / Altered mentation []  - 0 Support Surface(s) Assessment (bed, cushion, seat, etc.) INTERVENTIONS - Wound Cleansing / Measurement [name], WITHERS. ([id number]) []  - 0 Simple Wound  Cleansing - one wound X- 3 5 Complex Wound Cleansing - multiple wounds X- 1 5 Wound Imaging (photographs - any number of wounds) []  - 0 Wound Tracing (instead of photographs) []  -  0 Simple Wound Measurement - one wound X- 3 5 Complex Wound Measurement - multiple wounds INTERVENTIONS - Wound Dressings X - Small Wound Dressing one or multiple wounds 3 10 []  - 0 Medium Wound Dressing one or multiple wounds []  - 0 Large Wound Dressing one or multiple wounds X- 1 5 Application of Medications - topical []  - 0 Application of Medications - injection INTERVENTIONS - Miscellaneous []  - External ear exam 0 []  - 0 Specimen Collection (cultures, biopsies, blood, body fluids, etc.) []  - 0 Specimen(s) / Culture(s) sent or taken to Lab for analysis []  - 0 Patient Transfer (multiple staff / Civil Service fast streamer / Similar devices) []  - 0 Simple Staple / Suture removal (25 or less) []  - 0 Complex Staple / Suture removal (26 or more) []  - 0 Hypo / Hyperglycemic Management (close monitor of Blood Glucose) []  - 0 Ankle / Brachial Index (ABI) - do not check if billed separately X- 1 5 Vital Signs Has the patient been seen at the hospital within the last three years: Yes Total Score: 125 Level Of Care: New/Established - Level 4 Electronic Signature(s) Signed: 07/13/2021 4:37:11 PM By: Donnamarie Poag Entered By: Donnamarie Poag on 07/13/2021 15:57:26 [name] ([id number]) -------------------------------------------------------------------------------- Encounter Discharge Information Details Patient Name: [name]. Date of Service: 07/13/2021 2:30 PM Medical Record Number: [id number] Patient Account Number: [id number] Date of Birth/Sex: 1951-02-17 (71 y.o. F) Treating RN: Donnamarie Poag Primary Care Rolf Fells: Rutherford Guys Other Clinician: Referring Lynnlee Revels: Rutherford Guys Treating Reynard Christoffersen/Extender: Yaakov Guthrie in Treatment: 15 Encounter Discharge Information Items Discharge  Condition: Stable Ambulatory Status: Ambulatory Discharge Destination: Home Transportation: Private Auto Accompanied By: self Schedule Follow-up Appointment: Yes Clinical Summary of Care: Electronic Signature(s) Signed: 07/13/2021 4:37:11 PM By: Donnamarie Poag Entered By: Donnamarie Poag on 07/13/2021 16:02:29 [name] ([id number]) -------------------------------------------------------------------------------- Lower Extremity Assessment Details Patient Name: [name]. Date of Service: 07/13/2021 2:30 PM Medical Record Number: [id number] Patient Account Number: [id number] Date of Birth/Sex: August 19, 1950 (71 y.o. F) Treating RN: Donnamarie Poag Primary Care Katheline Brendlinger: Rutherford Guys Other Clinician: Referring Tory Septer: Rutherford Guys Treating Mazen Marcin/Extender: Yaakov Guthrie in Treatment: 15 Edema Assessment Assessed: [Left: No] [Right: Yes] Edema: [Left: Ye] [Right: s] Calf Left: Right: Point of Measurement: 19 cm From Medial Instep 49.5 cm Ankle Left: Right: Point of Measurement: 10 cm From Medial Instep 22 cm Vascular Assessment Pulses: Dorsalis Pedis Palpable: [Right:Yes] Electronic Signature(s) Signed: 07/13/2021 4:37:11 PM By: Donnamarie Poag Entered By: Donnamarie Poag on 07/13/2021 15:21:12 [name] ([id number]) -------------------------------------------------------------------------------- Multi Wound Chart Details Patient Name: [name]. Date of Service: 07/13/2021 2:30 PM Medical Record Number: [id number] Patient Account Number: [id number] Date of Birth/Sex: 1950/08/08 (71 y.o. F) Treating RN: Donnamarie Poag Primary Care Navneet Schmuck: Rutherford Guys Other Clinician: Referring Oswaldo Cueto: Rutherford Guys Treating Abimbola Aki/Extender: Yaakov Guthrie in Treatment: 15 Vital Signs Height(in): 66 Pulse(bpm): 103 Weight(lbs): 332 Blood Pressure(mmHg): 122/66 Body Mass Index(BMI): 54 Temperature(F): 98.2 Respiratory Rate(breaths/min): 16 Photos: Wound  Location: Right, Proximal, Anterior Lower Leg Right Lower Leg Right, Posterior Lower Leg Wounding Event: Gradually Appeared Gradually Appeared Gradually Appeared Primary Etiology: Venous Leg Ulcer Venous Leg Ulcer Venous Leg Ulcer Comorbid History: Cataracts, Arrhythmia, Cataracts, Arrhythmia, Cataracts, Arrhythmia, Hypertension, Type II Diabetes, Hypertension, Type II Diabetes, Hypertension, Type II Diabetes, Osteoarthritis, Neuropathy Osteoarthritis, Neuropathy Osteoarthritis, Neuropathy Date Acquired: 06/08/2021 04/18/2021 06/01/2021 Weeks of Treatment: 5 4 6  Wound Status: Open Open Open Clustered Wound: Yes Yes No Measurements L x W x D (cm) 4.5x2x0.1 1.6x1.7x0.1 1.5x0.5x0.1 Area (cm) :  7.069 2.136 0.589 Volume (cm) : 0.707 0.214 0.059 % Reduction in Area: -1100.20% 92.70% 77.30% % Reduction in Volume: -1098.30% 92.70% 77.20% Classification: Partial Thickness Full Thickness Without Exposed Full Thickness Without Exposed Support Structures Support Structures Exudate Amount: Medium Large Large Exudate Type: Serosanguineous Serosanguineous Serous Exudate Color: red, brown red, brown amber Granulation Amount: Large (67-100%) Large (67-100%) Large (67-100%) Granulation Quality: Red, Pink Red Red, Pink Necrotic Amount: Small (1-33%) Small (1-33%) Small (1-33%) Exposed Structures: Fat Layer (Subcutaneous Tissue): Fat Layer (Subcutaneous Tissue): Fat Layer (Subcutaneous Tissue): Yes Yes Yes Fascia: No Fascia: No Fascia: No Tendon: No Tendon: No Tendon: No Muscle: No Muscle: No Muscle: No Joint: No Joint: No Joint: No Bone: No Bone: No Bone: No Treatment Notes Electronic Signature(s) Signed: 07/13/2021 4:37:11 PM By: Donnamarie Poag Entered By: Donnamarie Poag on 07/13/2021 15:22:16 [name] ([id number]) -------------------------------------------------------------------------------- [place] Details Patient Name: [name]. Date of Service:  07/13/2021 2:30 PM Medical Record Number: [id number] Patient Account Number: [id number] Date of Birth/Sex: 1951-03-07 (71 y.o. F) Treating RN: Donnamarie Poag Primary Care Isobelle Tuckett: Rutherford Guys Other Clinician: Referring Markala Sitts: Rutherford Guys Treating Domanic Matusek/Extender: Yaakov Guthrie in Treatment: 15 Active Inactive Wound/Skin Impairment Nursing Diagnoses: Impaired tissue integrity Goals: Patient/caregiver will verbalize understanding of skin care regimen Date Initiated: 03/30/2021 Date Inactivated: 04/27/2021 Target Resolution Date: 03/30/2021 Goal Status: Met Ulcer/skin breakdown will have a volume reduction of 30% by week 4 Date Initiated: 03/30/2021 Date Inactivated: 05/11/2021 Target Resolution Date: 04/30/2021 Goal Status: Met Ulcer/skin breakdown will have a volume reduction of 50% by week 8 Date Initiated: 03/30/2021 Date Inactivated: 05/25/2021 Target Resolution Date: 05/30/2021 Goal Status: Met Ulcer/skin breakdown will have a volume reduction of 80% by week 12 Date Initiated: 03/30/2021 Target Resolution Date: 06/30/2021 Goal Status: Active Ulcer/skin breakdown will heal within 14 weeks Date Initiated: 03/30/2021 Target Resolution Date: 07/31/2021 Goal Status: Active Interventions: Assess patient/caregiver ability to obtain necessary supplies Assess patient/caregiver ability to perform ulcer/skin care regimen upon admission and as needed Assess ulceration(s) every visit Treatment Activities: Patient referred to home care : 03/30/2021 Referred to DME Roza Creamer for dressing supplies : 03/30/2021 Skin care regimen initiated : 03/30/2021 Notes: Electronic Signature(s) Signed: 07/13/2021 4:37:11 PM By: Donnamarie Poag Entered By: Donnamarie Poag on 07/13/2021 15:21:26 [name] ([id number]) -------------------------------------------------------------------------------- Pain Assessment Details Patient Name: [name]. Date of Service: 07/13/2021 2:30 PM Medical Record  Number: [id number] Patient Account Number: [id number] Date of Birth/Sex: Dec 25, 1950 (71 y.o. F) Treating RN: Donnamarie Poag Primary Care Roma Bondar: Rutherford Guys Other Clinician: Referring Asia Dusenbury: Rutherford Guys Treating Destenee Guerry/Extender: Yaakov Guthrie in Treatment: 15 Active Problems Location of Pain Severity and Description of Pain Patient Has Paino No Site Locations Rate the pain. Current Pain Level: 0 Pain Management and Medication Current Pain Management: Electronic Signature(s) Signed: 07/13/2021 4:37:11 PM By: Donnamarie Poag Entered By: Donnamarie Poag on 07/13/2021 15:11:43 [name] ([id number]) -------------------------------------------------------------------------------- Patient/Caregiver Education Details Patient Name: [name]. Date of Service: 07/13/2021 2:30 PM Medical Record Number: [id number] Patient Account Number: [id number] Date of Birth/Gender: 26-Aug-1950 (71 y.o. F) Treating RN: Donnamarie Poag Primary Care Physician: Rutherford Guys Other Clinician: Referring Physician: Rutherford Guys Treating Physician/Extender: Yaakov Guthrie in Treatment: 15 Education Assessment Education Provided To: Patient Education Topics Provided Basic Hygiene: Venous: Wound/Skin Impairment: Electronic Signature(s) Signed: 07/13/2021 4:37:11 PM By: Donnamarie Poag Entered By: Donnamarie Poag on 07/13/2021 15:58:00 [name] ([id number]) -------------------------------------------------------------------------------- Wound Assessment Details Patient Name: [name]. Date of Service: 07/13/2021 2:30 PM Medical Record  Number: [id number] Patient Account Number: [id number] Date of Birth/Sex: 1951-01-12 (71 y.o. F) Treating RN: Donnamarie Poag Primary Care Nilsa Macht: Rutherford Guys Other Clinician: Referring Gionni Freese: Rutherford Guys Treating Lamira Borin/Extender: Yaakov Guthrie in Treatment: 15 Wound Status Wound Number: 10 Primary Venous Leg Ulcer Etiology: Wound  Location: Right, Proximal, Anterior Lower Leg Wound Status: Open Wounding Event: Gradually Appeared Comorbid Cataracts, Arrhythmia, Hypertension, Type II Diabetes, Date Acquired: 06/08/2021 History: Osteoarthritis, Neuropathy Weeks Of Treatment: 5 Clustered Wound: Yes Photos Wound Measurements Length: (cm) 4.5 Width: (cm) 2 Depth: (cm) 0.1 Area: (cm) 7.069 Volume: (cm) 0.707 % Reduction in Area: -1100.2% % Reduction in Volume: -1098.3% Tunneling: No Undermining: No Wound Description Classification: Partial Thickness Exudate Amount: Medium Exudate Type: Serosanguineous Exudate Color: red, brown Foul Odor After Cleansing: No Slough/Fibrino Yes Wound Bed Granulation Amount: Large (67-100%) Exposed Structure Granulation Quality: Red, Pink Fascia Exposed: No Necrotic Amount: Small (1-33%) Fat Layer (Subcutaneous Tissue) Exposed: Yes Necrotic Quality: Adherent Slough Tendon Exposed: No Muscle Exposed: No Joint Exposed: No Bone Exposed: No Treatment Notes Wound #10 (Lower Leg) Wound Laterality: Right, Anterior, Proximal Cleanser Wound Cleanser Discharge Instruction: Wash your hands with soap and water. Remove old dressing, discard into plastic bag and place into trash. Cleanse the wound with Wound Cleanser prior to applying a clean dressing using gauze sponges, not tissues or cotton balls. Do not scrub or use excessive force. Pat dry using gauze sponges, not tissue or cotton balls. [name], [name] ([id number]) Peri-Wound Care Topical Gentamicin Discharge Instruction: IN OFFICE ONLY-Apply as directed by Ellissa Ayo. Primary Dressing Hydrofera Blue Ready Transfer Foam, 4x5 (in/in) Discharge Instruction: Apply Hydrofera Blue Ready to wound bed as directed Secondary Dressing ABD Pad 5x9 (in/in) Discharge Instruction: 3-Cover with ABD pad Secured With Coban Cohesive Bandage 4x5 (yds) Stretched Discharge Instruction: Apply Coban as directed. Kerlix Roll Sterile or  Non-Sterile 6-ply 4.5x4 (yd/yd) Discharge Instruction: Apply Kerlix as directed Compression Wrap Compression Stockings Add-Ons Electronic Signature(s) Signed: 07/13/2021 4:37:11 PM By: Donnamarie Poag Entered By: Donnamarie Poag on 07/13/2021 15:18:58 [name] ([id number]) -------------------------------------------------------------------------------- Wound Assessment Details Patient Name: [name]. Date of Service: 07/13/2021 2:30 PM Medical Record Number: [id number] Patient Account Number: [id number] Date of Birth/Sex: 1950/08/31 (71 y.o. F) Treating RN: Donnamarie Poag Primary Care Kinzy Weyers: Rutherford Guys Other Clinician: Referring Nil Xiong: Rutherford Guys Treating Marlene Pfluger/Extender: Yaakov Guthrie in Treatment: 15 Wound Status Wound Number: 11 Primary Venous Leg Ulcer Etiology: Wound Location: Right Lower Leg Wound Status: Open Wounding Event: Gradually Appeared Comorbid Cataracts, Arrhythmia, Hypertension, Type II Diabetes, Date Acquired: 04/18/2021 History: Osteoarthritis, Neuropathy Weeks Of Treatment: 4 Clustered Wound: Yes Photos Wound Measurements Length: (cm) 1.6 Width: (cm) 1.7 Depth: (cm) 0.1 Area: (cm) 2.136 Volume: (cm) 0.214 % Reduction in Area: 92.7% % Reduction in Volume: 92.7% Tunneling: No Undermining: No Wound Description Classification: Full Thickness Without Exposed Support Structures Exudate Amount: Large Exudate Type: Serosanguineous Exudate Color: red, brown Foul Odor After Cleansing: No Slough/Fibrino Yes Wound Bed Granulation Amount: Large (67-100%) Exposed Structure Granulation Quality: Red Fascia Exposed: No Necrotic Amount: Small (1-33%) Fat Layer (Subcutaneous Tissue) Exposed: Yes Necrotic Quality: Adherent Slough Tendon Exposed: No Muscle Exposed: No Joint Exposed: No Bone Exposed: No Treatment Notes Wound #11 (Lower Leg) Wound Laterality: Right Cleanser Wound Cleanser Discharge Instruction: Wash your hands with  soap and water. Remove old dressing, discard into plastic bag and place into trash. Cleanse the wound with Wound Cleanser prior to applying a clean dressing using gauze sponges, not tissues or cotton balls. Do not scrub or  use excessive force. Pat dry using gauze sponges, not tissue or cotton balls. [name], [name] ([id number]) Peri-Wound Care Topical Gentamicin Discharge Instruction: IN OFFICE ONLY-Apply as directed by Leaman Abe. Primary Dressing Hydrofera Blue Ready Transfer Foam, 4x5 (in/in) Discharge Instruction: Apply Hydrofera Blue Ready to wound bed as directed Secondary Dressing Zetuvit Plus Silicone Non-bordered 5x5 (in/in) Discharge Instruction: OR ALTERNATIVE ABSORBANT PAD Secured With Coban Cohesive Bandage 4x5 (yds) Stretched Discharge Instruction: Apply Coban as directed. Kerlix Roll Sterile or Non-Sterile 6-ply 4.5x4 (yd/yd) Discharge Instruction: Apply Kerlix as directed Compression Wrap Compression Stockings Add-Ons Electronic Signature(s) Signed: 07/13/2021 4:37:11 PM By: Donnamarie Poag Entered By: Donnamarie Poag on 07/13/2021 15:20:14 [name] ([id number]) -------------------------------------------------------------------------------- Wound Assessment Details Patient Name: [name]. Date of Service: 07/13/2021 2:30 PM Medical Record Number: [id number] Patient Account Number: [id number] Date of Birth/Sex: 23-May-1951 (71 y.o. F) Treating RN: Donnamarie Poag Primary Care Dino Borntreger: Rutherford Guys Other Clinician: Referring Neale Marzette: Rutherford Guys Treating Marjoria Mancillas/Extender: Yaakov Guthrie in Treatment: 15 Wound Status Wound Number: 9 Primary Venous Leg Ulcer Etiology: Wound Location: Right, Posterior Lower Leg Wound Status: Open Wounding Event: Gradually Appeared Comorbid Cataracts, Arrhythmia, Hypertension, Type II Diabetes, Date Acquired: 06/01/2021 History: Osteoarthritis, Neuropathy Weeks Of Treatment: 6 Clustered Wound: No Photos Wound  Measurements Length: (cm) 1.5 Width: (cm) 0.5 Depth: (cm) 0.1 Area: (cm) 0.589 Volume: (cm) 0.059 % Reduction in Area: 77.3% % Reduction in Volume: 77.2% Tunneling: No Undermining: No Wound Description Classification: Full Thickness Without Exposed Support Structures Exudate Amount: Large Exudate Type: Serous Exudate Color: amber Foul Odor After Cleansing: No Slough/Fibrino Yes Wound Bed Granulation Amount: Large (67-100%) Exposed Structure Granulation Quality: Red, Pink Fascia Exposed: No Necrotic Amount: Small (1-33%) Fat Layer (Subcutaneous Tissue) Exposed: Yes Necrotic Quality: Adherent Slough Tendon Exposed: No Muscle Exposed: No Joint Exposed: No Bone Exposed: No Treatment Notes Wound #9 (Lower Leg) Wound Laterality: Right, Posterior Cleanser Wound Cleanser Discharge Instruction: Wash your hands with soap and water. Remove old dressing, discard into plastic bag and place into trash. Cleanse the wound with Wound Cleanser prior to applying a clean dressing using gauze sponges, not tissues or cotton balls. Do not scrub or use excessive force. Pat dry using gauze sponges, not tissue or cotton balls. [name], [name] ([id number]) Peri-Wound Care Topical Gentamicin Discharge Instruction: IN OFFICE ONLY-Apply as directed by Tomma Ehinger. Primary Dressing Hydrofera Blue Ready Transfer Foam, 4x5 (in/in) Discharge Instruction: Apply Hydrofera Blue Ready to wound bed as directed Secondary Dressing Zetuvit Plus Silicone Non-bordered 5x5 (in/in) Discharge Instruction: OR ALTERNATIVE ABSORBANT PAD Secured With Coban Cohesive Bandage 4x5 (yds) Stretched Discharge Instruction: Apply Coban as directed. Kerlix Roll Sterile or Non-Sterile 6-ply 4.5x4 (yd/yd) Discharge Instruction: Apply Kerlix as directed Compression Wrap Compression Stockings Add-Ons Electronic Signature(s) Signed: 07/13/2021 4:37:11 PM By: Donnamarie Poag Entered By: Donnamarie Poag on 07/13/2021 15:17:47 [name] ([id number]) -------------------------------------------------------------------------------- [place] Details Patient Name: [name]. Date of Service: 07/13/2021 2:30 PM Medical Record Number: [id number] Patient Account Number: [id number] Date of Birth/Sex: 04-Dec-1950 (71 y.o. F) Treating RN: Donnamarie Poag Primary Care Yanni Quiroa: Rutherford Guys Other Clinician: Referring Cyree Chuong: Rutherford Guys Treating Romano Stigger/Extender: Yaakov Guthrie in Treatment: 15 Vital Signs Time Taken: 15:10 Temperature (F): 98.2 Height (in): 66 Pulse (bpm): 103 Weight (lbs): 332 Respiratory Rate (breaths/min): 16 Body Mass Index (BMI): 53.6 Blood Pressure (mmHg): 122/66 Reference Range: 80 - 120 mg / dl Electronic Signature(s) Signed: 07/13/2021 4:37:11 PM By: Donnamarie Poag Entered ByDonnamarie Poag on 07/13/2021 15:11:31

## 2021-07-13 NOTE — Progress Notes (Signed)
Miranda, [name] ([id number]) Visit Report for 07/13/2021 Chief Complaint Document Details Patient Name: [name], [name]. Date of Service: 07/13/2021 2:30 PM Medical Record Number: [id number] Patient Account Number: [id number] Date of Birth/Sex: 08-10-1950 (71 y.o. F) Treating RN: Donnamarie Poag Primary Care Provider: Rutherford Guys Other Clinician: Referring Provider: Rutherford Guys Treating Provider/Extender: Yaakov Guthrie in Treatment: 15 Information Obtained from: Patient Chief Complaint Bilateral lower extremity wounds Electronic Signature(s) Signed: 07/13/2021 5:08:18 PM By: Kalman Shan DO Entered By: Kalman Shan on 07/13/2021 17:04:09 [name] ([id number]) -------------------------------------------------------------------------------- HPI Details Patient Name: [name]. Date of Service: 07/13/2021 2:30 PM Medical Record Number: [id number] Patient Account Number: [id number] Date of Birth/Sex: [birth year] (71 y.o. F) Treating RN: Donnamarie Poag Primary Care Provider: Rutherford Guys Other Clinician: Referring Provider: Rutherford Guys Treating Provider/Extender: Yaakov Guthrie in Treatment: 15 History of Present Illness HPI Description: Admission 10/5 [name] is a 71 year old female with a past medical history of hypertension, venous insufficiency, chronic diastolic heart failure and insulin-dependent type 2 diabetes that presents To the clinic for bilateral lower extremity wounds. She states that her right lower extremity developed a blister 1 week ago and she has been keeping the area covered with a bandaid. She states that when the Band-Aid came off it tore her skin. She subsequently developed pain, increased warmth and erythema to the right lower extremity. She denies purulent drainage. She has also developed an open wound to the left lower extremity that recently developed. She also has a small open wound to the proximal portion of the  left great toe. This has been present since August and was evaluated by podiatry. It has not healed and she has not followed up with podiatry for this issue. 10/12; patient presents for follow-up. She states she has 2 days left on her antibiotics. She is tolerated this well. She reports improvement to her right lower extremity symptoms. She denies signs of infection. She has home health. 10/26; patient presents for follow-up. She has no issues or complaints today. The compression wraps slid down to her shin. These were placed on Monday by home health. She has a couple of new open wounds due to this. She denies signs of infection. 11/2; patient presents for follow-up. The compression wraps had slid down to her shin again. The wraps taken off today do not appear to be appropriate material for 3 layer compression wrap. She currently denies signs of infection. 11/9; patient presents for follow-up. She was able to increase her diuretic over the past week. Again home health does not have appropriate materials for 3 layer compression. She currently denies signs of infection. 11/16; patient presents for follow-up. She has no issues or complaints today. She currently denies signs of infection. 11/30; patient presents for follow-up. The wrap continues to have a hard time staying in place. Overall she is doing well with no issues or complaints today. 12/7; patient presents for follow-up. She brought her juxta light compression today. She states however she cannot get these on daily. She has no issues or complaints today. 12/14; patient presents for follow-up. She states that the wrap rolled and created a new wound to her right lower extremity. Other than that she has no issues or complaints today. 12/21; patient presents for follow-up. The compression wraps continue to have a hard time staying in place. They again have rolled down her leg. 12/28; patient presents for follow-up. She has no issues or  complaints today. 1/11; patient presents for follow-up. She missed  her last clinic appointment due to transportation issues. She has no issues or complaints today. 1/18; patient presents for follow-up. She is been using her juxta light compression to the left lower extremity without issues. Home health has been changing the wrap on the right leg. She denies signs of infection today. Electronic Signature(s) Signed: 07/13/2021 5:08:18 PM By: Kalman Shan DO Entered By: Kalman Shan on 07/13/2021 17:04:45 [name] ([id number]) -------------------------------------------------------------------------------- Physical Exam Details Patient Name: [name]. Date of Service: 07/13/2021 2:30 PM Medical Record Number: [id number] Patient Account Number: [id number] Date of Birth/Sex: July 25, 1950 (71 y.o. F) Treating RN: Donnamarie Poag Primary Care Provider: Rutherford Guys Other Clinician: Referring Provider: Rutherford Guys Treating Provider/Extender: Yaakov Guthrie in Treatment: 15 Constitutional . Cardiovascular . Psychiatric . Notes Patient has scattered wounds limited to skin breakdown to her right lower extremity. There are no open wounds to the left lower extremity. No signs of infection. Electronic Signature(s) Signed: 07/13/2021 5:08:18 PM By: Kalman Shan DO Entered By: Kalman Shan on 07/13/2021 17:05:19 [name] ([id number]) -------------------------------------------------------------------------------- Physician Orders Details Patient Name: [name]. Date of Service: 07/13/2021 2:30 PM Medical Record Number: [id number] Patient Account Number: [id number] Date of Birth/Sex: Jun 17, 1951 (71 y.o. F) Treating RN: Donnamarie Poag Primary Care Provider: Rutherford Guys Other Clinician: Referring Provider: Rutherford Guys Treating Provider/Extender: Yaakov Guthrie in Treatment: 15 Verbal / Phone Orders: No Diagnosis Coding Follow-up  Appointments o Return Appointment in 1 week. o Nurse Visit as needed [place]: - [facility]-assist learning and application of Juxtelite velcro wrap Left leg o [place] for wound care. May utilize formulary equivalent dressing for wound treatment orders unless otherwise specified. Home Health Nurse may visit PRN to address patientos wound care needs. - SEE additional order for OT eval and treat for deconditioning, unable to don socks and needs to wear Vecro compression therapy o Scheduled days for dressing changes to be completed; exception, patient has scheduled wound care visit that day. o **Please direct any NON-WOUND related issues/requests for orders to patient's Primary Care Physician. **If current dressing causes regression in wound condition, may D/C ordered dressing product/s and apply Normal Saline Moist Dressing daily until next [place] or Other MD appointment. **Notify [facility] of regression in wound condition at [place] Bathing/ Shower/ Hygiene o May shower; gently cleanse wound with antibacterial soap, rinse and pat dry prior to dressing wounds o May shower with wound dressing protected with water repellent cover or cast protector. o No tub bath. Anesthetic (Use 'Patient Medications' Section for Anesthetic Order Entry) o Lidocaine applied to wound bed Edema Control - Lymphedema / Segmental Compressive Device / Other o Optional: One layer of unna paste to top of compression wrap (to act as an anchor). - please use or compression wrap will not stay up-RIGHT LEG o Patient to wear own Velcro compression garment. Remove compression stockings every night before going to bed and put on every morning when getting up. - START JUXTELITE VELCRO WRAP TO LEFT LEG-assist with don and doff and education as well as assistive device of sock donner o Elevate leg(s) parallel to the  floor when sitting. o DO YOUR BEST to sleep in the bed at night. DO NOT sleep in your recliner. Long hours of sitting in a recliner leads to swelling of the legs and/or potential wounds on your backside. o Other: - Kerlix and Coban RIGHT LEG ONLY-wrap base of toes to behind knee-same  as other leg bilateral wrap lower compression Non-Wound Condition o Additional non-wound orders/instructions: - Zinc oxide to any excoriated areas near drainage on right lower leg Additional Orders / Instructions o Follow Nutritious Diet and Increase Protein Intake o Other: - OT EVAL AND TREAT FOR DECONDITIONING-UNABLE TO DON SOCKS/needs to be able to don velcro wraps Medications-Please add to medication list. o Other: - follow with PCP about Lasix and Potassium tabs so that labwork may be done so meds can be adjusted Wound Treatment Wound #10 - Lower Leg Wound Laterality: Right, Anterior, Proximal Cleanser: Wound Cleanser 3 x Per Week/30 Days Discharge Instructions: Wash your hands with soap and water. Remove old dressing, discard into plastic bag and place into trash. Cleanse the wound with Wound Cleanser prior to applying a clean dressing using gauze sponges, not tissues or cotton balls. Do not scrub or use excessive force. Pat dry using gauze sponges, not tissue or cotton balls. Topical: Gentamicin 3 x Per Week/30 Days Discharge Instructions: IN OFFICE ONLY-Apply as directed by provider. Primary Dressing: Hydrofera Blue Ready Transfer Foam, 4x5 (in/in) 3 x Per Week/30 Days Discharge Instructions: Apply Hydrofera Blue Ready to wound bed as directed Secondary Dressing: ABD Pad 5x9 (in/in) 3 x Per Week/30 Days LANETA, [name] ([id number]) Discharge Instructions: 3-Cover with ABD pad Secured With: Coban Cohesive Bandage 4x5 (yds) Stretched 3 x Per Week/30 Days Discharge Instructions: Apply Coban as directed. Secured With: [employer] or Non-Sterile 6-ply 4.5x4 (yd/yd) 3 x Per Week/30  Days Discharge Instructions: Apply Kerlix as directed Wound #11 - Lower Leg Wound Laterality: Right Cleanser: Wound Cleanser 3 x Per Week/30 Days Discharge Instructions: Wash your hands with soap and water. Remove old dressing, discard into plastic bag and place into trash. Cleanse the wound with Wound Cleanser prior to applying a clean dressing using gauze sponges, not tissues or cotton balls. Do not scrub or use excessive force. Pat dry using gauze sponges, not tissue or cotton balls. Topical: Gentamicin 3 x Per Week/30 Days Discharge Instructions: IN OFFICE ONLY-Apply as directed by provider. Primary Dressing: Hydrofera Blue Ready Transfer Foam, 4x5 (in/in) 3 x Per Week/30 Days Discharge Instructions: Apply Hydrofera Blue Ready to wound bed as directed Secondary Dressing: Zetuvit Plus Silicone Non-bordered 5x5 (in/in) 3 x Per Week/30 Days Discharge Instructions: OR ALTERNATIVE ABSORBANT PAD Secured With: Coban Cohesive Bandage 4x5 (yds) Stretched 3 x Per Week/30 Days Discharge Instructions: Apply Coban as directed. Secured With: [employer] or Non-Sterile 6-ply 4.5x4 (yd/yd) 3 x Per Week/30 Days Discharge Instructions: Apply Kerlix as directed Wound #9 - Lower Leg Wound Laterality: Right, Posterior Cleanser: Wound Cleanser 3 x Per Week/30 Days Discharge Instructions: Wash your hands with soap and water. Remove old dressing, discard into plastic bag and place into trash. Cleanse the wound with Wound Cleanser prior to applying a clean dressing using gauze sponges, not tissues or cotton balls. Do not scrub or use excessive force. Pat dry using gauze sponges, not tissue or cotton balls. Topical: Gentamicin 3 x Per Week/30 Days Discharge Instructions: IN OFFICE ONLY-Apply as directed by provider. Primary Dressing: Hydrofera Blue Ready Transfer Foam, 4x5 (in/in) 3 x Per Week/30 Days Discharge Instructions: Apply Hydrofera Blue Ready to wound bed as directed Secondary Dressing: Zetuvit  Plus Silicone Non-bordered 5x5 (in/in) 3 x Per Week/30 Days Discharge Instructions: OR ALTERNATIVE ABSORBANT PAD Secured With: Coban Cohesive Bandage 4x5 (yds) Stretched 3 x Per Week/30 Days Discharge Instructions: Apply Coban as directed. Secured With: [employer] or Non-Sterile 6-ply 4.5x4 (  yd/yd) 3 x Per Week/30 Days Discharge Instructions: Apply Kerlix as directed Electronic Signature(s) Signed: 07/13/2021 5:08:18 PM By: Kalman Shan DO Previous Signature: 07/13/2021 4:37:11 PM Version By: Donnamarie Poag Entered By: Kalman Shan on 07/13/2021 17:07:38 [name] ([id number]) -------------------------------------------------------------------------------- Problem List Details Patient Name: [name]. Date of Service: 07/13/2021 2:30 PM Medical Record Number: [id number] Patient Account Number: [id number] Date of Birth/Sex: 12-13-1950 (71 y.o. F) Treating RN: Donnamarie Poag Primary Care Provider: Rutherford Guys Other Clinician: Referring Provider: Rutherford Guys Treating Provider/Extender: Yaakov Guthrie in Treatment: 15 Active Problems ICD-10 Encounter Code Description Active Date MDM Diagnosis L97.812 Non-pressure chronic ulcer of other part of right lower leg with fat layer 06/08/2021 No Yes exposed I87.331 Chronic venous hypertension (idiopathic) with ulcer and inflammation of 03/30/2021 No Yes right lower extremity I87.332 Chronic venous hypertension (idiopathic) with ulcer and inflammation of 03/30/2021 No Yes left lower extremity S91.302D Unspecified open wound, left foot, subsequent encounter 03/30/2021 No Yes Inactive Problems Resolved Problems Electronic Signature(s) Signed: 07/13/2021 5:08:18 PM By: Kalman Shan DO Entered By: Kalman Shan on 07/13/2021 17:03:49 [name] ([id number]) -------------------------------------------------------------------------------- Progress Note Details Patient Name: [name]. Date of  Service: 07/13/2021 2:30 PM Medical Record Number: [id number] Patient Account Number: [id number] Date of Birth/Sex: [birth year] (71 y.o. F) Treating RN: Donnamarie Poag Primary Care Provider: Rutherford Guys Other Clinician: Referring Provider: Rutherford Guys Treating Provider/Extender: Yaakov Guthrie in Treatment: 15 Subjective Chief Complaint Information obtained from Patient Bilateral lower extremity wounds History of Present Illness (HPI) Admission 10/5 Ms. Bitania Shankland is a 71 year old female with a past medical history of hypertension, venous insufficiency, chronic diastolic heart failure and insulin-dependent type 2 diabetes that presents To the clinic for bilateral lower extremity wounds. She states that her right lower extremity developed a blister 1 week ago and she has been keeping the area covered with a bandaid. She states that when the Band-Aid came off it tore her skin. She subsequently developed pain, increased warmth and erythema to the right lower extremity. She denies purulent drainage. She has also developed an open wound to the left lower extremity that recently developed. She also has a small open wound to the proximal portion of the left great toe. This has been present since August and was evaluated by podiatry. It has not healed and she has not followed up with podiatry for this issue. 10/12; patient presents for follow-up. She states she has 2 days left on her antibiotics. She is tolerated this well. She reports improvement to her right lower extremity symptoms. She denies signs of infection. She has home health. 10/26; patient presents for follow-up. She has no issues or complaints today. The compression wraps slid down to her shin. These were placed on Monday by home health. She has a couple of new open wounds due to this. She denies signs of infection. 11/2; patient presents for follow-up. The compression wraps had slid down to her shin again. The wraps taken off  today do not appear to be appropriate material for 3 layer compression wrap. She currently denies signs of infection. 11/9; patient presents for follow-up. She was able to increase her diuretic over the past week. Again home health does not have appropriate materials for 3 layer compression. She currently denies signs of infection. 11/16; patient presents for follow-up. She has no issues or complaints today. She currently denies signs of infection. 11/30; patient presents for follow-up. The wrap continues to have a hard time staying in place. Overall she is  doing well with no issues or complaints today. 12/7; patient presents for follow-up. She brought her juxta light compression today. She states however she cannot get these on daily. She has no issues or complaints today. 12/14; patient presents for follow-up. She states that the wrap rolled and created a new wound to her right lower extremity. Other than that she has no issues or complaints today. 12/21; patient presents for follow-up. The compression wraps continue to have a hard time staying in place. They again have rolled down her leg. 12/28; patient presents for follow-up. She has no issues or complaints today. 1/11; patient presents for follow-up. She missed her last clinic appointment due to transportation issues. She has no issues or complaints today. 1/18; patient presents for follow-up. She is been using her juxta light compression to the left lower extremity without issues. Home health has been changing the wrap on the right leg. She denies signs of infection today. Objective Constitutional Vitals Time Taken: 3:10 PM, Height: 66 in, Weight: 332 lbs, BMI: 53.6, Temperature: 98.2 F, Pulse: 103 bpm, Respiratory Rate: 16 breaths/min, Blood Pressure: 122/66 mmHg. General Notes: Patient has scattered wounds limited to skin breakdown to her right lower extremity. There are no open wounds to the left lower [name], Miranda F.  ([id number]) extremity. No signs of infection. Integumentary (Hair, Skin) Wound #10 status is Open. Original cause of wound was Gradually Appeared. The date acquired was: 06/08/2021. The wound has been in treatment 5 weeks. The wound is located on the Right,Proximal,Anterior Lower Leg. The wound measures 4.5cm length x 2cm width x 0.1cm depth; 7.069cm^2 area and 0.707cm^3 volume. There is Fat Layer (Subcutaneous Tissue) exposed. There is no tunneling or undermining noted. There is a medium amount of serosanguineous drainage noted. There is large (67-100%) red, pink granulation within the wound bed. There is a small (1-33%) amount of necrotic tissue within the wound bed including Adherent Slough. Wound #11 status is Open. Original cause of wound was Gradually Appeared. The date acquired was: 04/18/2021. The wound has been in treatment 4 weeks. The wound is located on the Right Lower Leg. The wound measures 1.6cm length x 1.7cm width x 0.1cm depth; 2.136cm^2 area and 0.214cm^3 volume. There is Fat Layer (Subcutaneous Tissue) exposed. There is no tunneling or undermining noted. There is a large amount of serosanguineous drainage noted. There is large (67-100%) red granulation within the wound bed. There is a small (1-33%) amount of necrotic tissue within the wound bed including Adherent Slough. Wound #9 status is Open. Original cause of wound was Gradually Appeared. The date acquired was: 06/01/2021. The wound has been in treatment 6 weeks. The wound is located on the Right,Posterior Lower Leg. The wound measures 1.5cm length x 0.5cm width x 0.1cm depth; 0.589cm^2 area and 0.059cm^3 volume. There is Fat Layer (Subcutaneous Tissue) exposed. There is no tunneling or undermining noted. There is a large amount of serous drainage noted. There is large (67-100%) red, pink granulation within the wound bed. There is a small (1-33%) amount of necrotic tissue within the wound bed including Adherent  Slough. 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 Overall the wound beds have healthy granulation tissue however her her compression wrap continues to fall did not give  her proper compression. We will continue to try and reinforce with an Unna boot at the top. Follow-up in 1 week. Plan Follow-up Appointments: Return Appointment in 1 week. Nurse Visit as needed Home Health: [place]: - [facility]-assist learning and application of Juxtelite velcro wrap Left leg [place] for wound care. May utilize formulary equivalent dressing for wound treatment orders unless otherwise specified. Home Health Nurse may visit PRN to address patient s wound care needs. - SEE additional order for OT eval and treat for deconditioning, unable to don socks and needs to wear Vecro compression therapy Scheduled days for dressing changes to be completed; exception, patient has scheduled wound care visit that day. **Please direct any NON-WOUND related issues/requests for orders to patient's Primary Care Physician. **If current dressing causes regression in wound condition, may D/C ordered dressing product/s and apply Normal Saline Moist Dressing daily until next [place] or Other MD appointment. **Notify [facility] of regression in wound condition at [place] Bathing/ Shower/ Hygiene: May shower; gently cleanse wound with antibacterial soap, rinse and pat dry prior to dressing wounds May shower with wound dressing protected with water repellent cover or cast protector. No tub bath. Anesthetic (Use  'Patient Medications' Section for Anesthetic Order Entry): Lidocaine applied to wound bed Edema Control - Lymphedema / Segmental Compressive Device / Other: Optional: One layer of unna paste to top of compression wrap (to act as an anchor). - please use or compression wrap will not stay up-RIGHT LEG Patient to wear own Velcro compression garment. Remove compression stockings every night before going to bed and put on every morning when getting up. - START JUXTELITE VELCRO WRAP TO LEFT LEG-assist with don and doff and education as well as assistive device of sock donner Elevate leg(s) parallel to the floor when sitting. DO YOUR BEST to sleep in the bed at night. DO NOT sleep in your recliner. Long hours of sitting in a recliner leads to swelling of the legs and/or potential wounds on your backside. Other: - Kerlix and Coban RIGHT LEG ONLY-wrap base of toes to behind knee-same as other leg bilateral wrap lower compression Non-Wound Condition: Additional non-wound orders/instructions: - Zinc oxide to any excoriated areas near drainage on right lower leg Additional Orders / Instructions: Follow Nutritious Diet and Increase Protein Intake Other: - OT EVAL AND TREAT FOR DECONDITIONING-UNABLE TO DON SOCKS/needs to be able to don velcro wraps Medications-Please add to medication list.: [name], [name] ([id number]) Other: - follow with PCP about Lasix and Potassium tabs so that labwork may be done so meds can be adjusted WOUND #10: - Lower Leg Wound Laterality: Right, Anterior, Proximal Cleanser: Wound Cleanser 3 x Per Week/30 Days Discharge Instructions: Wash your hands with soap and water. Remove old dressing, discard into plastic bag and place into trash. Cleanse the wound with Wound Cleanser prior to applying a clean dressing using gauze sponges, not tissues or cotton balls. Do not scrub or use excessive force. Pat dry using gauze sponges, not tissue or cotton balls. Topical: Gentamicin 3 x Per  Week/30 Days Discharge Instructions: IN OFFICE ONLY-Apply as directed by provider. Primary Dressing: Hydrofera Blue Ready Transfer Foam, 4x5 (in/in) 3 x Per Week/30 Days Discharge Instructions: Apply Hydrofera Blue Ready to wound bed as directed Secondary Dressing: ABD Pad 5x9 (in/in) 3 x Per Week/30 Days Discharge Instructions: 3-Cover with ABD pad Secured With: Coban Cohesive Bandage 4x5 (yds) Stretched 3 x Per Week/30 Days Discharge Instructions: Apply  Coban as directed. Secured With: [employer] or Non-Sterile 6-ply 4.5x4 (yd/yd) 3 x Per Week/30 Days Discharge Instructions: Apply Kerlix as directed WOUND #11: - Lower Leg Wound Laterality: Right Cleanser: Wound Cleanser 3 x Per Week/30 Days Discharge Instructions: Wash your hands with soap and water. Remove old dressing, discard into plastic bag and place into trash. Cleanse the wound with Wound Cleanser prior to applying a clean dressing using gauze sponges, not tissues or cotton balls. Do not scrub or use excessive force. Pat dry using gauze sponges, not tissue or cotton balls. Topical: Gentamicin 3 x Per Week/30 Days Discharge Instructions: IN OFFICE ONLY-Apply as directed by provider. Primary Dressing: Hydrofera Blue Ready Transfer Foam, 4x5 (in/in) 3 x Per Week/30 Days Discharge Instructions: Apply Hydrofera Blue Ready to wound bed as directed Secondary Dressing: Zetuvit Plus Silicone Non-bordered 5x5 (in/in) 3 x Per Week/30 Days Discharge Instructions: OR ALTERNATIVE ABSORBANT PAD Secured With: Coban Cohesive Bandage 4x5 (yds) Stretched 3 x Per Week/30 Days Discharge Instructions: Apply Coban as directed. Secured With: [employer] or Non-Sterile 6-ply 4.5x4 (yd/yd) 3 x Per Week/30 Days Discharge Instructions: Apply Kerlix as directed WOUND #9: - Lower Leg Wound Laterality: Right, Posterior Cleanser: Wound Cleanser 3 x Per Week/30 Days Discharge Instructions: Wash your hands with soap and water. Remove old  dressing, discard into plastic bag and place into trash. Cleanse the wound with Wound Cleanser prior to applying a clean dressing using gauze sponges, not tissues or cotton balls. Do not scrub or use excessive force. Pat dry using gauze sponges, not tissue or cotton balls. Topical: Gentamicin 3 x Per Week/30 Days Discharge Instructions: IN OFFICE ONLY-Apply as directed by provider. Primary Dressing: Hydrofera Blue Ready Transfer Foam, 4x5 (in/in) 3 x Per Week/30 Days Discharge Instructions: Apply Hydrofera Blue Ready to wound bed as directed Secondary Dressing: Zetuvit Plus Silicone Non-bordered 5x5 (in/in) 3 x Per Week/30 Days Discharge Instructions: OR ALTERNATIVE ABSORBANT PAD Secured With: Coban Cohesive Bandage 4x5 (yds) Stretched 3 x Per Week/30 Days Discharge Instructions: Apply Coban as directed. Secured With: [employer] or Non-Sterile 6-ply 4.5x4 (yd/yd) 3 x Per Week/30 Days Discharge Instructions: Apply Kerlix as directed 1. Hydrofera Blue with Kerlix/Coban 2. Follow-up in 1 week Electronic Signature(s) Signed: 07/13/2021 5:08:18 PM By: Kalman Shan DO Entered By: Kalman Shan on 07/13/2021 17:06:59 [name] ([id number]) -------------------------------------------------------------------------------- SuperBill Details Patient Name: [name]. Date of Service: 07/13/2021 Medical Record Number: [id number] Patient Account Number: [id number] Date of Birth/Sex: 05-07-1951 (71 y.o. F) Treating RN: Donnamarie Poag Primary Care Provider: Rutherford Guys Other Clinician: Referring Provider: Rutherford Guys Treating Provider/Extender: Yaakov Guthrie in Treatment: 15 Diagnosis Coding ICD-10 Codes Code Description [phone] Non-pressure chronic ulcer of other part of right lower leg with fat layer exposed I87.331 Chronic venous hypertension (idiopathic) with ulcer and inflammation of right lower extremity I87.332 Chronic venous hypertension (idiopathic) with  ulcer and inflammation of left lower extremity S91.302D Unspecified open wound, left foot, subsequent encounter Facility Procedures CPT4 Code: [id number] Description: 99214 - WOUND CARE VISIT-LEV 4 EST PT Modifier: Quantity: 1 Physician Procedures CPT4 Code Description: [id number] 99213 - WC PHYS LEVEL 3 - EST PT Modifier: Quantity: 1 CPT4 Code Description: ICD-10 Diagnosis Description K93.267 Non-pressure chronic ulcer of other part of right lower leg with fat lay I87.331 Chronic venous hypertension (idiopathic) with ulcer and inflammation of Modifier: er exposed right lower extremit Quantity: y Engineer, maintenance) Signed: 07/13/2021 5:08:18 PM By: Kalman Shan DO Previous Signature: 07/13/2021 4:37:11 PM Version By: Lyndel Safe,  Joy Entered By: Kalman Shan on 07/13/2021 17:07:17

## 2021-07-20 ENCOUNTER — Encounter (HOSPITAL_BASED_OUTPATIENT_CLINIC_OR_DEPARTMENT_OTHER): Payer: Medicare Other | Admitting: Internal Medicine

## 2021-07-20 ENCOUNTER — Other Ambulatory Visit: Payer: Self-pay

## 2021-07-20 DIAGNOSIS — L97812 Non-pressure chronic ulcer of other part of right lower leg with fat layer exposed: Secondary | ICD-10-CM | POA: Diagnosis not present

## 2021-07-20 DIAGNOSIS — I87331 Chronic venous hypertension (idiopathic) with ulcer and inflammation of right lower extremity: Secondary | ICD-10-CM

## 2021-07-20 DIAGNOSIS — I87333 Chronic venous hypertension (idiopathic) with ulcer and inflammation of bilateral lower extremity: Secondary | ICD-10-CM | POA: Diagnosis not present

## 2021-07-20 NOTE — Progress Notes (Signed)
[name] ([id number]) Visit Report for 07/20/2021 Chief Complaint Document Details Patient Name: [name], [name]. Date of Service: 07/20/2021 2:30 PM Medical Record Number: [id number] Patient Account Number: [id number] Date of Birth/Sex: January 29, 1951 (71 y.o. F) Treating RN: Donnamarie Poag Primary Care Provider: Rutherford Guys Other Clinician: Referring Provider: Rutherford Guys Treating Provider/Extender: Yaakov Guthrie in Treatment: 16 Information Obtained from: Patient Chief Complaint Bilateral lower extremity wounds Electronic Signature(s) Signed: 07/20/2021 3:58:46 PM By: Kalman Shan DO Entered By: Kalman Shan on 07/20/2021 15:50:41 [name] ([id number]) -------------------------------------------------------------------------------- HPI Details Patient Name: [name]. Date of Service: 07/20/2021 2:30 PM Medical Record Number: [id number] Patient Account Number: [id number] Date of Birth/Sex: 01-23-1951 (71 y.o. F) Treating RN: Donnamarie Poag Primary Care Provider: Rutherford Guys Other Clinician: Referring Provider: Rutherford Guys Treating Provider/Extender: Yaakov Guthrie in Treatment: 16 History of Present Illness HPI Description: Admission 10/5 Ms. Gemini Bunte is a 71 year old female with a past medical history of hypertension, venous insufficiency, chronic diastolic heart failure and insulin-dependent type 2 diabetes that presents To the clinic for bilateral lower extremity wounds. She states that her right lower extremity developed a blister 1 week ago and she has been keeping the area covered with a bandaid. She states that when the Band-Aid came off it tore her skin. She subsequently developed pain, increased warmth and erythema to the right lower extremity. She denies purulent drainage. She has also developed an open wound to the left lower extremity that recently developed. She also has a small open wound to the proximal portion of the  left great toe. This has been present since August and was evaluated by podiatry. It has not healed and she has not followed up with podiatry for this issue. 10/12; patient presents for follow-up. She states she has 2 days left on her antibiotics. She is tolerated this well. She reports improvement to her right lower extremity symptoms. She denies signs of infection. She has home health. 10/26; patient presents for follow-up. She has no issues or complaints today. The compression wraps slid down to her shin. These were placed on Monday by home health. She has a couple of new open wounds due to this. She denies signs of infection. 11/2; patient presents for follow-up. The compression wraps had slid down to her shin again. The wraps taken off today do not appear to be appropriate material for 3 layer compression wrap. She currently denies signs of infection. 11/9; patient presents for follow-up. She was able to increase her diuretic over the past week. Again home health does not have appropriate materials for 3 layer compression. She currently denies signs of infection. 11/16; patient presents for follow-up. She has no issues or complaints today. She currently denies signs of infection. 11/30; patient presents for follow-up. The wrap continues to have a hard time staying in place. Overall she is doing well with no issues or complaints today. 12/7; patient presents for follow-up. She brought her juxta light compression today. She states however she cannot get these on daily. She has no issues or complaints today. 12/14; patient presents for follow-up. She states that the wrap rolled and created a new wound to her right lower extremity. Other than that she has no issues or complaints today. 12/21; patient presents for follow-up. The compression wraps continue to have a hard time staying in place. They again have rolled down her leg. 12/28; patient presents for follow-up. She has no issues or  complaints today. 1/11; patient presents for follow-up. She missed  her last clinic appointment due to transportation issues. She has no issues or complaints today. 1/18; patient presents for follow-up. She is been using her juxta light compression to the left lower extremity without issues. Home health has been changing the wrap on the right leg. She denies signs of infection today. 1/25; patient presents for follow-up. She is continuing to use her juxta light compression to the left lower extremity without issues. Home health has been changing the wrap on the right leg and she denies any issues to this area. She denies signs of infection. Electronic Signature(s) Signed: 07/20/2021 3:58:46 PM By: Kalman Shan DO Entered By: Kalman Shan on 07/20/2021 15:51:19 [name] ([id number]) -------------------------------------------------------------------------------- Physical Exam Details Patient Name: [name]. Date of Service: 07/20/2021 2:30 PM Medical Record Number: [id number] Patient Account Number: [id number] Date of Birth/Sex: 1951-06-03 (71 y.o. F) Treating RN: Donnamarie Poag Primary Care Provider: Rutherford Guys Other Clinician: Referring Provider: Rutherford Guys Treating Provider/Extender: Yaakov Guthrie in Treatment: 16 Constitutional . Cardiovascular . Psychiatric . Notes Patient has scattered wounds limited to skin breakdown to her right lower extremity. There are no open wounds to the left lower extremity. No signs of infection. Electronic Signature(s) Signed: 07/20/2021 3:58:46 PM By: Kalman Shan DO Entered By: Kalman Shan on 07/20/2021 15:51:49 [name] ([id number]) -------------------------------------------------------------------------------- Physician Orders Details Patient Name: [name]. Date of Service: 07/20/2021 2:30 PM Medical Record Number: [id number] Patient Account Number: [id number] Date of Birth/Sex: 1950/07/25  (71 y.o. F) Treating RN: Donnamarie Poag Primary Care Provider: Rutherford Guys Other Clinician: Referring Provider: Rutherford Guys Treating Provider/Extender: Yaakov Guthrie in Treatment: 68 Verbal / Phone Orders: No Diagnosis Coding Follow-up Appointments o Return Appointment in 1 week. o Nurse Visit as needed [place]: - [facility]-assist learning and application of Juxtelite velcro wrap Left leg o [place] for wound care. May utilize formulary equivalent dressing for wound treatment orders unless otherwise specified. Home Health Nurse may visit PRN to address patientos wound care needs. - SEE additional order for OT eval and treat for deconditioning, unable to don socks and needs to wear Vecro compression therapy o Scheduled days for dressing changes to be completed; exception, patient has scheduled wound care visit that day. o **Please direct any NON-WOUND related issues/requests for orders to patient's Primary Care Physician. **If current dressing causes regression in wound condition, may D/C ordered dressing product/s and apply Normal Saline Moist Dressing daily until next [place] or Other MD appointment. **Notify [facility] of regression in wound condition at [place] Bathing/ Shower/ Hygiene o May shower; gently cleanse wound with antibacterial soap, rinse and pat dry prior to dressing wounds o May shower with wound dressing protected with water repellent cover or cast protector. o No tub bath. Anesthetic (Use 'Patient Medications' Section for Anesthetic Order Entry) o Lidocaine applied to wound bed Edema Control - Lymphedema / Segmental Compressive Device / Other o Optional: One layer of unna paste to top of compression wrap (to act as an anchor). - please use or compression wrap will not stay up-RIGHT LEG o Patient to wear own Velcro compression garment. Remove  compression stockings every night before going to bed and put on every morning when getting up. - START JUXTELITE VELCRO WRAP TO LEFT LEG-assist with don and doff and education as well as assistive device of sock donner o Elevate leg(s) parallel to the floor when sitting. o DO YOUR BEST to sleep  in the bed at night. DO NOT sleep in your recliner. Long hours of sitting in a recliner leads to swelling of the legs and/or potential wounds on your backside. o Other: - Kerlix and Coban RIGHT LEG ONLY-wrap base of toes to behind knee-same as other leg bilateral wrap lower compression Non-Wound Condition o Additional non-wound orders/instructions: - Zinc oxide to any excoriated areas near drainage on right lower leg Additional Orders / Instructions o Follow Nutritious Diet and Increase Protein Intake o Other: - OT EVAL AND TREAT FOR DECONDITIONING-UNABLE TO DON SOCKS/needs to be able to don velcro wraps Medications-Please add to medication list. o Other: - follow with PCP about Lasix and Potassium tabs so that labwork may be done so meds can be adjusted Wound Treatment Wound #10 - Lower Leg Wound Laterality: Right, Anterior, Proximal Cleanser: Wound Cleanser 3 x Per Week/30 Days Discharge Instructions: Wash your hands with soap and water. Remove old dressing, discard into plastic bag and place into trash. Cleanse the wound with Wound Cleanser prior to applying a clean dressing using gauze sponges, not tissues or cotton balls. Do not scrub or use excessive force. Pat dry using gauze sponges, not tissue or cotton balls. Topical: Gentamicin 3 x Per Week/30 Days Discharge Instructions: IN OFFICE ONLY-Apply as directed by provider. Primary Dressing: Hydrofera Blue Ready Transfer Foam, 4x5 (in/in) 3 x Per Week/30 Days Discharge Instructions: Apply Hydrofera Blue Ready to wound bed as directed Secondary Dressing: ABD Pad 5x9 (in/in) 3 x Per Week/30 Days ALISE, CALAIS ([id number]) Discharge  Instructions: 3-Cover with ABD pad Secured With: Coban Cohesive Bandage 4x5 (yds) Stretched 3 x Per Week/30 Days Discharge Instructions: Apply Coban as directed. Secured With: [employer] or Non-Sterile 6-ply 4.5x4 (yd/yd) 3 x Per Week/30 Days Discharge Instructions: Apply Kerlix as directed Wound #11 - Lower Leg Wound Laterality: Right Cleanser: Wound Cleanser 3 x Per Week/30 Days Discharge Instructions: Wash your hands with soap and water. Remove old dressing, discard into plastic bag and place into trash. Cleanse the wound with Wound Cleanser prior to applying a clean dressing using gauze sponges, not tissues or cotton balls. Do not scrub or use excessive force. Pat dry using gauze sponges, not tissue or cotton balls. Topical: Gentamicin 3 x Per Week/30 Days Discharge Instructions: IN OFFICE ONLY-Apply as directed by provider. Primary Dressing: Hydrofera Blue Ready Transfer Foam, 4x5 (in/in) 3 x Per Week/30 Days Discharge Instructions: Apply Hydrofera Blue Ready to wound bed as directed Secondary Dressing: Zetuvit Plus Silicone Non-bordered 5x5 (in/in) 3 x Per Week/30 Days Discharge Instructions: OR ALTERNATIVE ABSORBANT PAD Secured With: Coban Cohesive Bandage 4x5 (yds) Stretched 3 x Per Week/30 Days Discharge Instructions: Apply Coban as directed. Secured With: [employer] or Non-Sterile 6-ply 4.5x4 (yd/yd) 3 x Per Week/30 Days Discharge Instructions: Apply Kerlix as directed Wound #9 - Lower Leg Wound Laterality: Right, Posterior Cleanser: Wound Cleanser 3 x Per Week/30 Days Discharge Instructions: Wash your hands with soap and water. Remove old dressing, discard into plastic bag and place into trash. Cleanse the wound with Wound Cleanser prior to applying a clean dressing using gauze sponges, not tissues or cotton balls. Do not scrub or use excessive force. Pat dry using gauze sponges, not tissue or cotton balls. Topical: Gentamicin 3 x Per Week/30 Days Discharge  Instructions: IN OFFICE ONLY-Apply as directed by provider. Primary Dressing: Hydrofera Blue Ready Transfer Foam, 4x5 (in/in) 3 x Per Week/30 Days Discharge Instructions: Apply Hydrofera Blue Ready to wound bed as directed Secondary Dressing:  Zetuvit Plus Silicone Non-bordered 5x5 (in/in) 3 x Per Week/30 Days Discharge Instructions: OR ALTERNATIVE ABSORBANT PAD Secured With: Coban Cohesive Bandage 4x5 (yds) Stretched 3 x Per Week/30 Days Discharge Instructions: Apply Coban as directed. Secured With: [employer] or Non-Sterile 6-ply 4.5x4 (yd/yd) 3 x Per Week/30 Days Discharge Instructions: Apply Kerlix as directed Electronic Signature(s) Signed: 07/20/2021 3:58:46 PM By: Kalman Shan DO Entered By: Kalman Shan on 07/20/2021 15:56:31 [name] ([id number]) -------------------------------------------------------------------------------- Problem List Details Patient Name: [name]. Date of Service: 07/20/2021 2:30 PM Medical Record Number: [id number] Patient Account Number: [id number] Date of Birth/Sex: [birth year] (71 y.o. F) Treating RN: Donnamarie Poag Primary Care Provider: Rutherford Guys Other Clinician: Referring Provider: Rutherford Guys Treating Provider/Extender: Yaakov Guthrie in Treatment: 16 Active Problems ICD-10 Encounter Code Description Active Date MDM Diagnosis L97.812 Non-pressure chronic ulcer of other part of right lower leg with fat layer 06/08/2021 No Yes exposed I87.331 Chronic venous hypertension (idiopathic) with ulcer and inflammation of 03/30/2021 No Yes right lower extremity I87.332 Chronic venous hypertension (idiopathic) with ulcer and inflammation of 03/30/2021 No Yes left lower extremity S91.302D Unspecified open wound, left foot, subsequent encounter 03/30/2021 No Yes Inactive Problems Resolved Problems Electronic Signature(s) Signed: 07/20/2021 3:58:46 PM By: Kalman Shan DO Entered By: Kalman Shan on 07/20/2021  15:50:36 [name] ([id number]) -------------------------------------------------------------------------------- Progress Note Details Patient Name: [name]. Date of Service: 07/20/2021 2:30 PM Medical Record Number: [id number] Patient Account Number: [id number] Date of Birth/Sex: 24-Mar-1951 (71 y.o. F) Treating RN: Donnamarie Poag Primary Care Provider: Rutherford Guys Other Clinician: Referring Provider: Rutherford Guys Treating Provider/Extender: Yaakov Guthrie in Treatment: 16 Subjective Chief Complaint Information obtained from Patient Bilateral lower extremity wounds History of Present Illness (HPI) Admission 10/5 Ms. Judithann Villamar is a 71 year old female with a past medical history of hypertension, venous insufficiency, chronic diastolic heart failure and insulin-dependent type 2 diabetes that presents To the clinic for bilateral lower extremity wounds. She states that her right lower extremity developed a blister 1 week ago and she has been keeping the area covered with a bandaid. She states that when the Band-Aid came off it tore her skin. She subsequently developed pain, increased warmth and erythema to the right lower extremity. She denies purulent drainage. She has also developed an open wound to the left lower extremity that recently developed. She also has a small open wound to the proximal portion of the left great toe. This has been present since August and was evaluated by podiatry. It has not healed and she has not followed up with podiatry for this issue. 10/12; patient presents for follow-up. She states she has 2 days left on her antibiotics. She is tolerated this well. She reports improvement to her right lower extremity symptoms. She denies signs of infection. She has home health. 10/26; patient presents for follow-up. She has no issues or complaints today. The compression wraps slid down to her shin. These were placed on Monday by home health. She has a  couple of new open wounds due to this. She denies signs of infection. 11/2; patient presents for follow-up. The compression wraps had slid down to her shin again. The wraps taken off today do not appear to be appropriate material for 3 layer compression wrap. She currently denies signs of infection. 11/9; patient presents for follow-up. She was able to increase her diuretic over the past week. Again home health does not have appropriate materials for 3 layer compression. She currently denies signs of infection. 11/16;  patient presents for follow-up. She has no issues or complaints today. She currently denies signs of infection. 11/30; patient presents for follow-up. The wrap continues to have a hard time staying in place. Overall she is doing well with no issues or complaints today. 12/7; patient presents for follow-up. She brought her juxta light compression today. She states however she cannot get these on daily. She has no issues or complaints today. 12/14; patient presents for follow-up. She states that the wrap rolled and created a new wound to her right lower extremity. Other than that she has no issues or complaints today. 12/21; patient presents for follow-up. The compression wraps continue to have a hard time staying in place. They again have rolled down her leg. 12/28; patient presents for follow-up. She has no issues or complaints today. 1/11; patient presents for follow-up. She missed her last clinic appointment due to transportation issues. She has no issues or complaints today. 1/18; patient presents for follow-up. She is been using her juxta light compression to the left lower extremity without issues. Home health has been changing the wrap on the right leg. She denies signs of infection today. 1/25; patient presents for follow-up. She is continuing to use her juxta light compression to the left lower extremity without issues. Home health has been changing the wrap on the right leg  and she denies any issues to this area. She denies signs of infection. Objective Constitutional Vitals Time Taken: 2:49 PM, Height: 66 in, Weight: 332 lbs, BMI: 53.6, Temperature: 98.2 F, Pulse: 103 bpm, Respiratory Rate: 16 breaths/min, Blood Pressure: 165/72 mmHg. AMAYRANY, CAFARO ([id number]) General Notes: Patient has scattered wounds limited to skin breakdown to her right lower extremity. There are no open wounds to the left lower extremity. No signs of infection. Integumentary (Hair, Skin) Wound #10 status is Open. Original cause of wound was Gradually Appeared. The date acquired was: 06/08/2021. The wound has been in treatment 6 weeks. The wound is located on the Right,Proximal,Anterior Lower Leg. The wound measures 4cm length x 1.5cm width x 0.1cm depth; 4.712cm^2 area and 0.471cm^3 volume. There is Fat Layer (Subcutaneous Tissue) exposed. There is no tunneling or undermining noted. There is a medium amount of serosanguineous drainage noted. There is large (67-100%) red, pink granulation within the wound bed. There is a small (1-33%) amount of necrotic tissue within the wound bed including Adherent Slough. Wound #11 status is Open. Original cause of wound was Gradually Appeared. The date acquired was: 04/18/2021. The wound has been in treatment 5 weeks. The wound is located on the Right Lower Leg. The wound measures 1cm length x 1.2cm width x 0.1cm depth; 0.942cm^2 area and 0.094cm^3 volume. There is Fat Layer (Subcutaneous Tissue) exposed. There is no tunneling or undermining noted. There is a large amount of serosanguineous drainage noted. There is large (67-100%) red granulation within the wound bed. There is a small (1-33%) amount of necrotic tissue within the wound bed including Adherent Slough. Wound #9 status is Open. Original cause of wound was Gradually Appeared. The date acquired was: 06/01/2021. The wound has been in treatment 7 weeks. The wound is located on the  Right,Posterior Lower Leg. The wound measures 0.5cm length x 0.5cm width x 0.1cm depth; 0.196cm^2 area and 0.02cm^3 volume. There is Fat Layer (Subcutaneous Tissue) exposed. There is no tunneling or undermining noted. There is a medium amount of serous drainage noted. There is small (1-33%) red, pink granulation within the wound bed. There is a large (67-100%) amount of  necrotic tissue within the wound bed including Adherent Slough. Assessment Active Problems ICD-10 Non-pressure chronic ulcer of other part of right lower leg with fat layer exposed Chronic venous hypertension (idiopathic) with ulcer and inflammation of right lower extremity Chronic venous hypertension (idiopathic) with ulcer and inflammation of left lower extremity Unspecified open wound, left foot, subsequent encounter Patient's wounds on the right lower extremity have shown improvement in size and appearance since last clinic visit. I recommended continuing with gentamicin Hydrofera Blue under Kerlix/Coban. I recommended keeping her legs elevated as much as possible. Plan Follow-up Appointments: Return Appointment in 1 week. Nurse Visit as needed Home Health: [place]: - [facility]-assist learning and application of Juxtelite velcro wrap Left leg [place] for wound care. May utilize formulary equivalent dressing for wound treatment orders unless otherwise specified. Home Health Nurse may visit PRN to address patient s wound care needs. - SEE additional order for OT eval and treat for deconditioning, unable to don socks and needs to wear Vecro compression therapy Scheduled days for dressing changes to be completed; exception, patient has scheduled wound care visit that day. **Please direct any NON-WOUND related issues/requests for orders to patient's Primary Care Physician. **If current dressing causes regression in wound condition, may D/C ordered dressing product/s and apply Normal Saline Moist Dressing  daily until next [place] or Other MD appointment. **Notify [facility] of regression in wound condition at [place] Bathing/ Shower/ Hygiene: May shower; gently cleanse wound with antibacterial soap, rinse and pat dry prior to dressing wounds May shower with wound dressing protected with water repellent cover or cast protector. No tub bath. Anesthetic (Use 'Patient Medications' Section for Anesthetic Order Entry): Lidocaine applied to wound bed Edema Control - Lymphedema / Segmental Compressive Device / Other: Optional: One layer of unna paste to top of compression wrap (to act as an anchor). - please use or compression wrap will not stay up-RIGHT LEG Patient to wear own Velcro compression garment. Remove compression stockings every night before going to bed and put on every morning when getting up. - START JUXTELITE VELCRO WRAP TO LEFT LEG-assist with don and doff and education as well as assistive device of sock donner Elevate leg(s) parallel to the floor when sitting. DO YOUR BEST to sleep in the bed at night. DO NOT sleep in your recliner. Long hours of sitting in a recliner leads to swelling of the legs and/or potential wounds on your backside. Other: - Kerlix and Coban RIGHT LEG ONLY-wrap base of toes to behind knee-same as other leg bilateral wrap lower compression Non-Wound Condition: Additional non-wound orders/instructions: - Zinc oxide to any excoriated areas near drainage on right lower leg Additional Orders / Instructions: JEWELIANA, DUDGEON ([id number]) Follow Nutritious Diet and Increase Protein Intake Other: - OT EVAL AND TREAT FOR DECONDITIONING-UNABLE TO DON SOCKS/needs to be able to don velcro wraps Medications-Please add to medication list.: Other: - follow with PCP about Lasix and Potassium tabs so that labwork may be done so meds can be adjusted WOUND #10: - Lower Leg Wound Laterality: Right, Anterior, Proximal Cleanser:  Wound Cleanser 3 x Per Week/30 Days Discharge Instructions: Wash your hands with soap and water. Remove old dressing, discard into plastic bag and place into trash. Cleanse the wound with Wound Cleanser prior to applying a clean dressing using gauze sponges, not tissues or cotton balls. Do not scrub or use excessive force. Pat dry using gauze sponges, not tissue or cotton balls.  Topical: Gentamicin 3 x Per Week/30 Days Discharge Instructions: IN OFFICE ONLY-Apply as directed by provider. Primary Dressing: Hydrofera Blue Ready Transfer Foam, 4x5 (in/in) 3 x Per Week/30 Days Discharge Instructions: Apply Hydrofera Blue Ready to wound bed as directed Secondary Dressing: ABD Pad 5x9 (in/in) 3 x Per Week/30 Days Discharge Instructions: 3-Cover with ABD pad Secured With: Coban Cohesive Bandage 4x5 (yds) Stretched 3 x Per Week/30 Days Discharge Instructions: Apply Coban as directed. Secured With: [employer] or Non-Sterile 6-ply 4.5x4 (yd/yd) 3 x Per Week/30 Days Discharge Instructions: Apply Kerlix as directed WOUND #11: - Lower Leg Wound Laterality: Right Cleanser: Wound Cleanser 3 x Per Week/30 Days Discharge Instructions: Wash your hands with soap and water. Remove old dressing, discard into plastic bag and place into trash. Cleanse the wound with Wound Cleanser prior to applying a clean dressing using gauze sponges, not tissues or cotton balls. Do not scrub or use excessive force. Pat dry using gauze sponges, not tissue or cotton balls. Topical: Gentamicin 3 x Per Week/30 Days Discharge Instructions: IN OFFICE ONLY-Apply as directed by provider. Primary Dressing: Hydrofera Blue Ready Transfer Foam, 4x5 (in/in) 3 x Per Week/30 Days Discharge Instructions: Apply Hydrofera Blue Ready to wound bed as directed Secondary Dressing: Zetuvit Plus Silicone Non-bordered 5x5 (in/in) 3 x Per Week/30 Days Discharge Instructions: OR ALTERNATIVE ABSORBANT PAD Secured With: Coban Cohesive Bandage 4x5  (yds) Stretched 3 x Per Week/30 Days Discharge Instructions: Apply Coban as directed. Secured With: [employer] or Non-Sterile 6-ply 4.5x4 (yd/yd) 3 x Per Week/30 Days Discharge Instructions: Apply Kerlix as directed WOUND #9: - Lower Leg Wound Laterality: Right, Posterior Cleanser: Wound Cleanser 3 x Per Week/30 Days Discharge Instructions: Wash your hands with soap and water. Remove old dressing, discard into plastic bag and place into trash. Cleanse the wound with Wound Cleanser prior to applying a clean dressing using gauze sponges, not tissues or cotton balls. Do not scrub or use excessive force. Pat dry using gauze sponges, not tissue or cotton balls. Topical: Gentamicin 3 x Per Week/30 Days Discharge Instructions: IN OFFICE ONLY-Apply as directed by provider. Primary Dressing: Hydrofera Blue Ready Transfer Foam, 4x5 (in/in) 3 x Per Week/30 Days Discharge Instructions: Apply Hydrofera Blue Ready to wound bed as directed Secondary Dressing: Zetuvit Plus Silicone Non-bordered 5x5 (in/in) 3 x Per Week/30 Days Discharge Instructions: OR ALTERNATIVE ABSORBANT PAD Secured With: Coban Cohesive Bandage 4x5 (yds) Stretched 3 x Per Week/30 Days Discharge Instructions: Apply Coban as directed. Secured With: [employer] or Non-Sterile 6-ply 4.5x4 (yd/yd) 3 x Per Week/30 Days Discharge Instructions: Apply Kerlix as directed 1. Hydrofera Blue, gentamicin under Kerlix/Coban Electronic Signature(s) Signed: 07/20/2021 3:58:46 PM By: Kalman Shan DO Entered By: Kalman Shan on 07/20/2021 15:55:51 [name] ([id number]) -------------------------------------------------------------------------------- SuperBill Details Patient Name: [name]. Date of Service: 07/20/2021 Medical Record Number: [id number] Patient Account Number: [id number] Date of Birth/Sex: 12-31-1950 (71 y.o. F) Treating RN: Donnamarie Poag Primary Care Provider: Rutherford Guys Other Clinician: Referring  Provider: Rutherford Guys Treating Provider/Extender: Yaakov Guthrie in Treatment: 16 Diagnosis Coding ICD-10 Codes Code Description [phone] Non-pressure chronic ulcer of other part of right lower leg with fat layer exposed I87.331 Chronic venous hypertension (idiopathic) with ulcer and inflammation of right lower extremity I87.332 Chronic venous hypertension (idiopathic) with ulcer and inflammation of left lower extremity S91.302D Unspecified open wound, left foot, subsequent encounter Facility Procedures CPT4 Code: [id number] Description: 99214 - WOUND CARE VISIT-LEV 4 EST PT Modifier: Quantity: 1 Physician Procedures CPT4 Code  Description: [id number] - WC PHYS LEVEL 3 - EST PT Modifier: Quantity: 1 CPT4 Code Description: ICD-10 Diagnosis Description Z48.270 Non-pressure chronic ulcer of other part of right lower leg with fat lay I87.331 Chronic venous hypertension (idiopathic) with ulcer and inflammation of Modifier: er exposed right lower extremit Quantity: y Engineer, maintenance) Signed: 07/20/2021 3:58:46 PM By: Kalman Shan DO Entered By: Kalman Shan on 07/20/2021 15:56:09

## 2021-07-20 NOTE — Progress Notes (Signed)
[name] ([id number]) Visit Report for 07/20/2021 Arrival Information Details Patient Name: [name], [name]. Date of Service: 07/20/2021 2:30 PM Medical Record Number: [id number] Patient Account Number: [id number] Date of Birth/Sex: October 09, 1950 (71 y.o. F) Treating RN: Donnamarie Poag Primary Care Abagayle Klutts: Rutherford Guys Other Clinician: Referring Glenn Gullickson: Rutherford Guys Treating Sosha Shepherd/Extender: Yaakov Guthrie in Treatment: 16 Visit Information History Since Last Visit Added or deleted any medications: No Patient Arrived: [name] Had a fall or experienced change in No Arrival Time: 14:45 activities of daily living that may affect Accompanied By: self risk of falls: Transfer Assistance: EasyPivot Patient Lift Hospitalized since last visit: No Patient Identification Verified: Yes Has Dressing in Place as Prescribed: Yes Secondary Verification Process Completed: Yes Has Compression in Place as Prescribed: Yes Patient Requires Transmission-Based No Pain Present Now: No Precautions: Patient Has Alerts: Yes Patient Alerts: Patient on Blood Thinner DIABETIC ELIQUIS Electronic Signature(s) Signed: 07/20/2021 4:20:18 PM By: Donnamarie Poag Entered By: Donnamarie Poag on 07/20/2021 14:49:02 [name] ([id number]) -------------------------------------------------------------------------------- Clinic Level of Care Assessment Details Patient Name: [name]. Date of Service: 07/20/2021 2:30 PM Medical Record Number: [id number] Patient Account Number: [id number] Date of Birth/Sex: 1950/11/17 (71 y.o. F) Treating RN: Donnamarie Poag Primary Care Peniel Biel: Rutherford Guys Other Clinician: Referring Lorea Kupfer: Rutherford Guys Treating Carissa Musick/Extender: Yaakov Guthrie in Treatment: 16 Clinic Level of Care Assessment Items TOOL 4 Quantity Score []  - Use when only an EandM is performed on FOLLOW-UP visit 0 ASSESSMENTS - Nursing Assessment / Reassessment []  - Reassessment of  Co-morbidities (includes updates in patient status) 0 []  - 0 Reassessment of Adherence to Treatment Plan ASSESSMENTS - Wound and Skin Assessment / Reassessment []  - Simple Wound Assessment / Reassessment - one wound 0 X- 3 5 Complex Wound Assessment / Reassessment - multiple wounds []  - 0 Dermatologic / Skin Assessment (not related to wound area) ASSESSMENTS - Focused Assessment []  - Circumferential Edema Measurements - multi extremities 0 []  - 0 Nutritional Assessment / Counseling / Intervention []  - 0 Lower Extremity Assessment (monofilament, tuning fork, pulses) []  - 0 Peripheral Arterial Disease Assessment (using hand held doppler) ASSESSMENTS - Ostomy and/or Continence Assessment and Care []  - Incontinence Assessment and Management 0 []  - 0 Ostomy Care Assessment and Management (repouching, etc.) PROCESS - Coordination of Care X - Simple Patient / Family Education for ongoing care 1 15 []  - 0 Complex (extensive) Patient / Family Education for ongoing care []  - 0 Staff obtains Programmer, systems, Records, Test Results / Process Orders X- 1 10 Staff telephones HHA, Nursing Homes / Clarify orders / etc []  - 0 Routine Transfer to another Facility (non-emergent condition) []  - 0 Routine Hospital Admission (non-emergent condition) []  - 0 New Admissions / Biomedical engineer / Ordering NPWT, Apligraf, etc. []  - 0 Emergency Hospital Admission (emergent condition) X- 1 10 Simple Discharge Coordination []  - 0 Complex (extensive) Discharge Coordination PROCESS - Special Needs []  - Pediatric / Minor Patient Management 0 []  - 0 Isolation Patient Management []  - 0 Hearing / Language / Visual special needs []  - 0 Assessment of Community assistance (transportation, D/C planning, etc.) []  - 0 Additional assistance / Altered mentation []  - 0 Support Surface(s) Assessment (bed, cushion, seat, etc.) INTERVENTIONS - Wound Cleansing / Measurement [name]. ([id number]) []  -  0 Simple Wound Cleansing - one wound X- 3 5 Complex Wound Cleansing - multiple wounds X- 1 5 Wound Imaging (photographs - any number of wounds) []  - 0 Wound Tracing (instead of photographs) []  -  0 Simple Wound Measurement - one wound X- 3 5 Complex Wound Measurement - multiple wounds INTERVENTIONS - Wound Dressings X - Small Wound Dressing one or multiple wounds 3 10 []  - 0 Medium Wound Dressing one or multiple wounds []  - 0 Large Wound Dressing one or multiple wounds X- 1 5 Application of Medications - topical []  - 0 Application of Medications - injection INTERVENTIONS - Miscellaneous []  - External ear exam 0 []  - 0 Specimen Collection (cultures, biopsies, blood, body fluids, etc.) []  - 0 Specimen(s) / Culture(s) sent or taken to Lab for analysis []  - 0 Patient Transfer (multiple staff / Civil Service fast streamer / Similar devices) []  - 0 Simple Staple / Suture removal (25 or less) []  - 0 Complex Staple / Suture removal (26 or more) []  - 0 Hypo / Hyperglycemic Management (close monitor of Blood Glucose) []  - 0 Ankle / Brachial Index (ABI) - do not check if billed separately X- 1 5 Vital Signs Has the patient been seen at the hospital within the last three years: Yes Total Score: 125 Level Of Care: New/Established - Level 4 Electronic Signature(s) Signed: 07/20/2021 4:20:18 PM By: Donnamarie Poag Entered By: Donnamarie Poag on 07/20/2021 15:40:18 [name] ([id number]) -------------------------------------------------------------------------------- Encounter Discharge Information Details Patient Name: [name]. Date of Service: 07/20/2021 2:30 PM Medical Record Number: [id number] Patient Account Number: [id number] Date of Birth/Sex: [birth year] (71 y.o. F) Treating RN: Donnamarie Poag Primary Care Breiana Stratmann: Rutherford Guys Other Clinician: Referring Dayan Kreis: Rutherford Guys Treating Kaylana Fenstermacher/Extender: Yaakov Guthrie in Treatment: 16 Encounter Discharge Information  Items Discharge Condition: Stable Ambulatory Status: Walker Discharge Destination: Home Transportation: Other Accompanied By: self Schedule Follow-up Appointment: Yes Clinical Summary of Care: Electronic Signature(s) Signed: 07/20/2021 4:20:18 PM By: Donnamarie Poag Entered By: Donnamarie Poag on 07/20/2021 15:49:42 [name] ([id number]) -------------------------------------------------------------------------------- Lower Extremity Assessment Details Patient Name: [name]. Date of Service: 07/20/2021 2:30 PM Medical Record Number: [id number] Patient Account Number: [id number] Date of Birth/Sex: [birth year] (71 y.o. F) Treating RN: Donnamarie Poag Primary Care Deette Revak: Rutherford Guys Other Clinician: Referring Skie Vitrano: Rutherford Guys Treating Izaya Netherton/Extender: Yaakov Guthrie in Treatment: 16 Edema Assessment Assessed: [Left: No] [Right: Yes] Edema: [Left: Ye] [Right: s] Calf Left: Right: Point of Measurement: 19 cm From Medial Instep 47 cm Ankle Left: Right: Point of Measurement: 10 cm From Medial Instep 22 cm Vascular Assessment Pulses: Dorsalis Pedis Palpable: [Right:Yes] Electronic Signature(s) Signed: 07/20/2021 4:20:18 PM By: Donnamarie Poag Entered By: Donnamarie Poag on 07/20/2021 14:59:08 [name] ([id number]) -------------------------------------------------------------------------------- Multi Wound Chart Details Patient Name: [name]. Date of Service: 07/20/2021 2:30 PM Medical Record Number: [id number] Patient Account Number: [id number] Date of Birth/Sex: Apr 24, 1951 (71 y.o. F) Treating RN: Donnamarie Poag Primary Care Nikolay Demetriou: Rutherford Guys Other Clinician: Referring Ninamarie Keel: Rutherford Guys Treating Parthenia Tellefsen/Extender: Yaakov Guthrie in Treatment: 16 Vital Signs Height(in): 22 Pulse(bpm): 103 Weight(lbs): 332 Blood Pressure(mmHg): 165/72 Body Mass Index(BMI): 53.6 Temperature(F): 98.2 Respiratory Rate(breaths/min):  16 Photos: Wound Location: Right, Proximal, Anterior Lower Leg Right Lower Leg Right, Posterior Lower Leg Wounding Event: Gradually Appeared Gradually Appeared Gradually Appeared Primary Etiology: Venous Leg Ulcer Venous Leg Ulcer Venous Leg Ulcer Comorbid History: Cataracts, Arrhythmia, Cataracts, Arrhythmia, Cataracts, Arrhythmia, Hypertension, Type II Diabetes, Hypertension, Type II Diabetes, Hypertension, Type II Diabetes, Osteoarthritis, Neuropathy Osteoarthritis, Neuropathy Osteoarthritis, Neuropathy Date Acquired: 06/08/2021 04/18/2021 06/01/2021 Weeks of Treatment: 6 5 7  Wound Status: Open Open Open Wound Recurrence: No No No Clustered Wound: Yes Yes No Measurements L x W x D (cm) 4x1.5x0.1  1x1.2x0.1 0.5x0.5x0.1 Area (cm) : 4.712 0.942 0.196 Volume (cm) : 0.471 0.094 0.02 % Reduction in Area: -700.00% 96.80% 92.40% % Reduction in Volume: -698.30% 96.80% 92.30% Classification: Partial Thickness Full Thickness Without Exposed Full Thickness Without Exposed Support Structures Support Structures Exudate Amount: Medium Large Medium Exudate Type: Serosanguineous Serosanguineous Serous Exudate Color: red, brown red, brown amber Granulation Amount: Large (67-100%) Large (67-100%) Small (1-33%) Granulation Quality: Red, Pink Red Red, Pink Necrotic Amount: Small (1-33%) Small (1-33%) Large (67-100%) Exposed Structures: Fat Layer (Subcutaneous Tissue): Fat Layer (Subcutaneous Tissue): Fat Layer (Subcutaneous Tissue): Yes Yes Yes Fascia: No Fascia: No Fascia: No Tendon: No Tendon: No Tendon: No Muscle: No Muscle: No Muscle: No Joint: No Joint: No Joint: No Bone: No Bone: No Bone: No Treatment Notes Electronic Signature(s) Signed: 07/20/2021 4:20:18 PM By: Donnamarie Poag Entered By: Donnamarie Poag on 07/20/2021 15:00:03 [name] ([id number]) -------------------------------------------------------------------------------- [place] Details Patient  Name: [name]. Date of Service: 07/20/2021 2:30 PM Medical Record Number: [id number] Patient Account Number: [id number] Date of Birth/Sex: [birth year] (71 y.o. F) Treating RN: Donnamarie Poag Primary Care Clement Deneault: Rutherford Guys Other Clinician: Referring Marianne Golightly: Rutherford Guys Treating Marchella Hibbard/Extender: Yaakov Guthrie in Treatment: 16 Active Inactive Wound/Skin Impairment Nursing Diagnoses: Impaired tissue integrity Goals: Patient/caregiver will verbalize understanding of skin care regimen Date Initiated: 03/30/2021 Date Inactivated: 04/27/2021 Target Resolution Date: 03/30/2021 Goal Status: Met Ulcer/skin breakdown will have a volume reduction of 30% by week 4 Date Initiated: 03/30/2021 Date Inactivated: 05/11/2021 Target Resolution Date: 04/30/2021 Goal Status: Met Ulcer/skin breakdown will have a volume reduction of 50% by week 8 Date Initiated: 03/30/2021 Date Inactivated: 05/25/2021 Target Resolution Date: 05/30/2021 Goal Status: Met Ulcer/skin breakdown will have a volume reduction of 80% by week 12 Date Initiated: 03/30/2021 Target Resolution Date: 06/30/2021 Goal Status: Active Ulcer/skin breakdown will heal within 14 weeks Date Initiated: 03/30/2021 Target Resolution Date: 07/31/2021 Goal Status: Active Interventions: Assess patient/caregiver ability to obtain necessary supplies Assess patient/caregiver ability to perform ulcer/skin care regimen upon admission and as needed Assess ulceration(s) every visit Treatment Activities: Patient referred to home care : 03/30/2021 Referred to DME Furious Chiarelli for dressing supplies : 03/30/2021 Skin care regimen initiated : 03/30/2021 Notes: Electronic Signature(s) Signed: 07/20/2021 4:20:18 PM By: Donnamarie Poag Entered By: Donnamarie Poag on 07/20/2021 14:59:21 [name] ([id number]) -------------------------------------------------------------------------------- Pain Assessment Details Patient Name: [name]. Date of  Service: 07/20/2021 2:30 PM Medical Record Number: [id number] Patient Account Number: [id number] Date of Birth/Sex: 1951/04/19 (71 y.o. F) Treating RN: Donnamarie Poag Primary Care Rinoa Garramone: Rutherford Guys Other Clinician: Referring Tallis Soledad: Rutherford Guys Treating Stevana Dufner/Extender: Yaakov Guthrie in Treatment: 16 Active Problems Location of Pain Severity and Description of Pain Patient Has Paino No Site Locations Rate the pain. Current Pain Level: 0 Pain Management and Medication Current Pain Management: Electronic Signature(s) Signed: 07/20/2021 4:20:18 PM By: Donnamarie Poag Entered By: Donnamarie Poag on 07/20/2021 14:51:20 [name] ([id number]) -------------------------------------------------------------------------------- Patient/Caregiver Education Details Patient Name: [name]. Date of Service: 07/20/2021 2:30 PM Medical Record Number: [id number] Patient Account Number: [id number] Date of Birth/Gender: 1950-11-09 (71 y.o. F) Treating RN: Donnamarie Poag Primary Care Physician: Rutherford Guys Other Clinician: Referring Physician: Rutherford Guys Treating Physician/Extender: Yaakov Guthrie in Treatment: 16 Education Assessment Education Provided To: Patient Education Topics Provided Basic Hygiene: Wound/Skin Impairment: Engineer, maintenance) Signed: 07/20/2021 4:20:18 PM By: Donnamarie Poag Entered By: Donnamarie Poag on 07/20/2021 15:48:57 [name] ([id number]) -------------------------------------------------------------------------------- Wound Assessment Details Patient Name: [name]. Date of Service: 07/20/2021  2:30 PM Medical Record Number: [id number] Patient Account Number: [id number] Date of Birth/Sex: [birth year] (71 y.o. F) Treating RN: Donnamarie Poag Primary Care Katelynn Heidler: Rutherford Guys Other Clinician: Referring Wyolene Weimann: Rutherford Guys Treating Nevaya Nagele/Extender: Yaakov Guthrie in Treatment: 16 Wound Status Wound Number: 10 Primary  Venous Leg Ulcer Etiology: Wound Location: Right, Proximal, Anterior Lower Leg Wound Status: Open Wounding Event: Gradually Appeared Comorbid Cataracts, Arrhythmia, Hypertension, Type II Diabetes, Date Acquired: 06/08/2021 History: Osteoarthritis, Neuropathy Weeks Of Treatment: 6 Clustered Wound: Yes Photos Wound Measurements Length: (cm) 4 Width: (cm) 1.5 Depth: (cm) 0.1 Area: (cm) 4.712 Volume: (cm) 0.471 % Reduction in Area: -700% % Reduction in Volume: -698.3% Tunneling: No Undermining: No Wound Description Classification: Partial Thickness Exudate Amount: Medium Exudate Type: Serosanguineous Exudate Color: red, brown Foul Odor After Cleansing: No Slough/Fibrino Yes Wound Bed Granulation Amount: Large (67-100%) Exposed Structure Granulation Quality: Red, Pink Fascia Exposed: No Necrotic Amount: Small (1-33%) Fat Layer (Subcutaneous Tissue) Exposed: Yes Necrotic Quality: Adherent Slough Tendon Exposed: No Muscle Exposed: No Joint Exposed: No Bone Exposed: No Treatment Notes Wound #10 (Lower Leg) Wound Laterality: Right, Anterior, Proximal Cleanser Wound Cleanser Discharge Instruction: Wash your hands with soap and water. Remove old dressing, discard into plastic bag and place into trash. Cleanse the wound with Wound Cleanser prior to applying a clean dressing using gauze sponges, not tissues or cotton balls. Do not scrub or use excessive force. Pat dry using gauze sponges, not tissue or cotton balls. ROCKY, RISHEL ([id number]) Peri-Wound Care Topical Gentamicin Discharge Instruction: IN OFFICE ONLY-Apply as directed by Kimiko Common. Primary Dressing Hydrofera Blue Ready Transfer Foam, 4x5 (in/in) Discharge Instruction: Apply Hydrofera Blue Ready to wound bed as directed Secondary Dressing ABD Pad 5x9 (in/in) Discharge Instruction: 3-Cover with ABD pad Secured With Coban Cohesive Bandage 4x5 (yds) Stretched Discharge Instruction: Apply Coban as  directed. Kerlix Roll Sterile or Non-Sterile 6-ply 4.5x4 (yd/yd) Discharge Instruction: Apply Kerlix as directed Compression Wrap Compression Stockings Add-Ons Electronic Signature(s) Signed: 07/20/2021 4:20:18 PM By: Donnamarie Poag Entered By: Donnamarie Poag on 07/20/2021 14:57:49 [name] ([id number]) -------------------------------------------------------------------------------- Wound Assessment Details Patient Name: [name]. Date of Service: 07/20/2021 2:30 PM Medical Record Number: [id number] Patient Account Number: [id number] Date of Birth/Sex: 09/27/1950 (71 y.o. F) Treating RN: Donnamarie Poag Primary Care Afton Lavalle: Rutherford Guys Other Clinician: Referring Fayth Trefry: Rutherford Guys Treating Tine Mabee/Extender: Yaakov Guthrie in Treatment: 16 Wound Status Wound Number: 11 Primary Venous Leg Ulcer Etiology: Wound Location: Right Lower Leg Wound Status: Open Wounding Event: Gradually Appeared Comorbid Cataracts, Arrhythmia, Hypertension, Type II Diabetes, Date Acquired: 04/18/2021 History: Osteoarthritis, Neuropathy Weeks Of Treatment: 5 Clustered Wound: Yes Photos Wound Measurements Length: (cm) 1 Width: (cm) 1.2 Depth: (cm) 0.1 Area: (cm) 0.942 Volume: (cm) 0.094 % Reduction in Area: 96.8% % Reduction in Volume: 96.8% Tunneling: No Undermining: No Wound Description Classification: Full Thickness Without Exposed Support Structures Exudate Amount: Large Exudate Type: Serosanguineous Exudate Color: red, brown Foul Odor After Cleansing: No Slough/Fibrino Yes Wound Bed Granulation Amount: Large (67-100%) Exposed Structure Granulation Quality: Red Fascia Exposed: No Necrotic Amount: Small (1-33%) Fat Layer (Subcutaneous Tissue) Exposed: Yes Necrotic Quality: Adherent Slough Tendon Exposed: No Muscle Exposed: No Joint Exposed: No Bone Exposed: No Treatment Notes Wound #11 (Lower Leg) Wound Laterality: Right Cleanser Wound Cleanser Discharge  Instruction: Wash your hands with soap and water. Remove old dressing, discard into plastic bag and place into trash. Cleanse the wound with Wound Cleanser prior to applying a clean dressing using gauze sponges, not tissues or cotton balls.  Do not scrub or use excessive force. Pat dry using gauze sponges, not tissue or cotton balls. DEEMA, JUNCAJ ([id number]) Peri-Wound Care Topical Gentamicin Discharge Instruction: IN OFFICE ONLY-Apply as directed by Josephyne Tarter. Primary Dressing Hydrofera Blue Ready Transfer Foam, 4x5 (in/in) Discharge Instruction: Apply Hydrofera Blue Ready to wound bed as directed Secondary Dressing Zetuvit Plus Silicone Non-bordered 5x5 (in/in) Discharge Instruction: OR ALTERNATIVE ABSORBANT PAD Secured With Coban Cohesive Bandage 4x5 (yds) Stretched Discharge Instruction: Apply Coban as directed. Kerlix Roll Sterile or Non-Sterile 6-ply 4.5x4 (yd/yd) Discharge Instruction: Apply Kerlix as directed Compression Wrap Compression Stockings Add-Ons Electronic Signature(s) Signed: 07/20/2021 4:20:18 PM By: Donnamarie Poag Entered By: Donnamarie Poag on 07/20/2021 14:58:33 [name] ([id number]) -------------------------------------------------------------------------------- Wound Assessment Details Patient Name: [name]. Date of Service: 07/20/2021 2:30 PM Medical Record Number: [id number] Patient Account Number: [id number] Date of Birth/Sex: 1950/08/25 (71 y.o. F) Treating RN: Donnamarie Poag Primary Care Teagyn Fishel: Rutherford Guys Other Clinician: Referring Rye Decoste: Rutherford Guys Treating Shital Crayton/Extender: Yaakov Guthrie in Treatment: 16 Wound Status Wound Number: 9 Primary Venous Leg Ulcer Etiology: Wound Location: Right, Posterior Lower Leg Wound Status: Open Wounding Event: Gradually Appeared Comorbid Cataracts, Arrhythmia, Hypertension, Type II Diabetes, Date Acquired: 06/01/2021 History: Osteoarthritis, Neuropathy Weeks Of Treatment:  7 Clustered Wound: No Photos Wound Measurements Length: (cm) 0.5 Width: (cm) 0.5 Depth: (cm) 0.1 Area: (cm) 0.196 Volume: (cm) 0.02 % Reduction in Area: 92.4% % Reduction in Volume: 92.3% Tunneling: No Undermining: No Wound Description Classification: Full Thickness Without Exposed Support Structures Exudate Amount: Medium Exudate Type: Serous Exudate Color: amber Foul Odor After Cleansing: No Slough/Fibrino Yes Wound Bed Granulation Amount: Small (1-33%) Exposed Structure Granulation Quality: Red, Pink Fascia Exposed: No Necrotic Amount: Large (67-100%) Fat Layer (Subcutaneous Tissue) Exposed: Yes Necrotic Quality: Adherent Slough Tendon Exposed: No Muscle Exposed: No Joint Exposed: No Bone Exposed: No Treatment Notes Wound #9 (Lower Leg) Wound Laterality: Right, Posterior Cleanser Wound Cleanser Discharge Instruction: Wash your hands with soap and water. Remove old dressing, discard into plastic bag and place into trash. Cleanse the wound with Wound Cleanser prior to applying a clean dressing using gauze sponges, not tissues or cotton balls. Do not scrub or use excessive force. Pat dry using gauze sponges, not tissue or cotton balls. ZENAYA, ULATOWSKI ([id number]) Peri-Wound Care Topical Gentamicin Discharge Instruction: IN OFFICE ONLY-Apply as directed by Manhattan Mccuen. Primary Dressing Hydrofera Blue Ready Transfer Foam, 4x5 (in/in) Discharge Instruction: Apply Hydrofera Blue Ready to wound bed as directed Secondary Dressing Zetuvit Plus Silicone Non-bordered 5x5 (in/in) Discharge Instruction: OR ALTERNATIVE ABSORBANT PAD Secured With Coban Cohesive Bandage 4x5 (yds) Stretched Discharge Instruction: Apply Coban as directed. Kerlix Roll Sterile or Non-Sterile 6-ply 4.5x4 (yd/yd) Discharge Instruction: Apply Kerlix as directed Compression Wrap Compression Stockings Add-Ons Electronic Signature(s) Signed: 07/20/2021 4:20:18 PM By: Donnamarie Poag Entered By: Donnamarie Poag on 07/20/2021 14:57:15 [name] ([id number]) -------------------------------------------------------------------------------- [place] Details Patient Name: [name]. Date of Service: 07/20/2021 2:30 PM Medical Record Number: [id number] Patient Account Number: [id number] Date of Birth/Sex: Oct 25, 1950 (71 y.o. F) Treating RN: Donnamarie Poag Primary Care Gregroy Dombkowski: Rutherford Guys Other Clinician: Referring Jian Hodgman: Rutherford Guys Treating Nacole Fluhr/Extender: Yaakov Guthrie in Treatment: 16 Vital Signs Time Taken: 14:49 Temperature (F): 98.2 Height (in): 66 Pulse (bpm): 103 Weight (lbs): 332 Respiratory Rate (breaths/min): 16 Body Mass Index (BMI): 53.6 Blood Pressure (mmHg): 165/72 Reference Range: 80 - 120 mg / dl Electronic Signature(s) Signed: 07/20/2021 4:20:18 PM By: Donnamarie Poag Entered ByDonnamarie Poag on 07/20/2021 14:51:01

## 2021-07-27 ENCOUNTER — Encounter: Payer: Medicare Other | Admitting: Internal Medicine

## 2021-08-03 ENCOUNTER — Encounter: Payer: Medicare Other | Attending: Internal Medicine | Admitting: Internal Medicine

## 2021-08-03 ENCOUNTER — Other Ambulatory Visit: Payer: Self-pay

## 2021-08-03 DIAGNOSIS — X58XXXD Exposure to other specified factors, subsequent encounter: Secondary | ICD-10-CM | POA: Diagnosis not present

## 2021-08-03 DIAGNOSIS — I5032 Chronic diastolic (congestive) heart failure: Secondary | ICD-10-CM | POA: Insufficient documentation

## 2021-08-03 DIAGNOSIS — Z794 Long term (current) use of insulin: Secondary | ICD-10-CM | POA: Insufficient documentation

## 2021-08-03 DIAGNOSIS — I110 Hypertensive heart disease with heart failure: Secondary | ICD-10-CM | POA: Insufficient documentation

## 2021-08-03 DIAGNOSIS — S91302D Unspecified open wound, left foot, subsequent encounter: Secondary | ICD-10-CM | POA: Diagnosis not present

## 2021-08-03 DIAGNOSIS — I87332 Chronic venous hypertension (idiopathic) with ulcer and inflammation of left lower extremity: Secondary | ICD-10-CM | POA: Insufficient documentation

## 2021-08-03 DIAGNOSIS — I87331 Chronic venous hypertension (idiopathic) with ulcer and inflammation of right lower extremity: Secondary | ICD-10-CM | POA: Insufficient documentation

## 2021-08-03 DIAGNOSIS — L97812 Non-pressure chronic ulcer of other part of right lower leg with fat layer exposed: Secondary | ICD-10-CM | POA: Insufficient documentation

## 2021-08-03 NOTE — Progress Notes (Addendum)
[name], [name] ([id number]) Visit Report for 08/03/2021 Arrival Information Details Patient Name: [name], [name]. Date of Service: 08/03/2021 3:00 PM Medical Record Number: [id number] Patient Account Number: [id number] Date of Birth/Sex: 1951/03/13 (71 y.o. F) Treating RN: Levora Dredge Primary Care Lambert Jeanty: Rutherford Guys Other Clinician: Referring Dedrick Heffner: Rutherford Guys Treating Ozelle Brubacher/Extender: Yaakov Guthrie in Treatment: 18 Visit Information History Since Last Visit Added or deleted any medications: No Patient Arrived: [name] Any new allergies or adverse reactions: No Arrival Time: 15:08 Had a fall or experienced change in No Accompanied By: self activities of daily living that may affect Transfer Assistance: None risk of falls: Patient Identification Verified: Yes Hospitalized since last visit: No Patient Requires Transmission-Based No Has Dressing in Place as Prescribed: Yes Precautions: Has Compression in Place as Prescribed: Yes Patient Has Alerts: Yes Pain Present Now: Yes Patient Alerts: Patient on [place] Signature(s) Signed: 08/03/2021 4:45:40 PM By: Levora Dredge Entered By: Levora Dredge on 08/03/2021 15:11:32 [name] ([id number]) -------------------------------------------------------------------------------- Clinic Level of Care Assessment Details Patient Name: [name]. Date of Service: 08/03/2021 3:00 PM Medical Record Number: [id number] Patient Account Number: [id number] Date of Birth/Sex: Apr 05, 1951 (71 y.o. F) Treating RN: Levora Dredge Primary Care Jencarlo Bonadonna: Rutherford Guys Other Clinician: Referring Kennia Vanvorst: Rutherford Guys Treating Oscar Forman/Extender: Yaakov Guthrie in Treatment: 18 Clinic Level of Care Assessment Items TOOL 4 Quantity Score _0  - Use when only an EandM is performed on FOLLOW-UP visit 0 ASSESSMENTS - Nursing Assessment / Reassessment _1  - Reassessment of  Co-morbidities (includes updates in patient status) 0 _2  - 0 Reassessment of Adherence to Treatment Plan ASSESSMENTS - Wound and Skin Assessment / Reassessment _3  - Simple Wound Assessment / Reassessment - one wound 0 X- 3 5 Complex Wound Assessment / Reassessment - multiple wounds _4  - 0 Dermatologic / Skin Assessment (not related to wound area) ASSESSMENTS - Focused Assessment _5  - Circumferential Edema Measurements - multi extremities 0 _6  - 0 Nutritional Assessment / Counseling / Intervention _7  - 0 Lower Extremity Assessment (monofilament, tuning fork, pulses) _8  - 0 Peripheral Arterial Disease Assessment (using hand held doppler) ASSESSMENTS - Ostomy and/or Continence Assessment and Care _9  - Incontinence Assessment and Management 0 _10  - 0 Ostomy Care Assessment and Management (repouching, etc.) PROCESS - Coordination of Care X - Simple Patient / Family Education for ongoing care 1 15 _11  - 0 Complex (extensive) Patient / Family Education for ongoing care _12  - 0 Staff obtains Programmer, systems, Records, Test Results / Process Orders _13  - 0 Staff telephones HHA, Nursing Homes / Clarify orders / etc _14  - 0 Routine Transfer to another Facility (non-emergent condition) _15  - 0 Routine Hospital Admission (non-emergent condition) _16  - 0 New Admissions / Biomedical engineer / Ordering NPWT, Apligraf, etc. _17  - 0 Emergency Hospital Admission (emergent condition) X- 1 10 Simple Discharge Coordination _18  - 0 Complex (extensive) Discharge Coordination PROCESS - Special Needs _19  - Pediatric / Minor Patient Management 0 _20  - 0 Isolation Patient Management _21  - 0 Hearing / Language / Visual special needs _22  - 0 Assessment of Community assistance (transportation, D/C planning, etc.) _23  - 0 Additional assistance / Altered mentation _24  - 0 Support Surface(s) Assessment (bed, cushion, seat, etc.) INTERVENTIONS - Wound Cleansing / Measurement [name], [name]. ([id number]) _25  -  0 Simple Wound Cleansing - one wound X- 3 5 Complex Wound Cleansing - multiple wounds X- 1 5 Wound Imaging (photographs - any number of wounds) _26  - 0 Wound Tracing (instead of photographs) _27  -  0 Simple Wound Measurement - one wound X- 3 5 Complex Wound Measurement - multiple wounds INTERVENTIONS - Wound Dressings _0  - Small Wound Dressing one or multiple wounds 0 X- 3 15 Medium Wound Dressing one or multiple wounds _1  - 0 Large Wound Dressing one or multiple wounds <[id number]>_2<\/WPYKDXIPJASNKNLZ>_7  - 0 Application of Medications - topical <QBHALPFXTKWIOXBD>_5<\/HGDJMEQASTMHDQQI>_2  - 0 Application of Medications - injection INTERVENTIONS - Miscellaneous _4  - External ear exam 0 _5  - 0 Specimen Collection (cultures, biopsies, blood, body fluids, etc.) _6  - 0 Specimen(s) / Culture(s) sent or taken to Lab for analysis _7  - 0 Patient Transfer (multiple staff / Civil Service fast streamer / Similar devices) _8  - 0 Simple Staple / Suture removal (25 or less) _9  - 0 Complex Staple / Suture removal (26 or more) _10  - 0 Hypo / Hyperglycemic Management (close monitor of Blood Glucose) _11  - 0 Ankle / Brachial Index (ABI) - do not check if billed separately X- 1 5 Vital Signs Has the patient been seen at the hospital within the last three years: Yes Total Score: 125 Level Of Care: New/Established - Level 4 Electronic Signature(s) Signed: 08/03/2021 4:45:40 PM By: Levora Dredge Entered By: Levora Dredge on 08/03/2021 16:16:01 [name] ([id number]) -------------------------------------------------------------------------------- Lower Extremity Assessment Details Patient Name: [name]. Date of Service: 08/03/2021 3:00 PM Medical Record Number: [id number] Patient Account Number: [id number] Date of Birth/Sex: 10-14-1950 (71 y.o. F) Treating RN: Levora Dredge Primary Care Javarius Tsosie: Rutherford Guys Other Clinician: Referring Kismet Facemire: Rutherford Guys Treating Jozsef Wescoat/Extender: Yaakov Guthrie in Treatment: 18 Edema Assessment Assessed: [Left:  No] [Right: No] Edema: [Left: Yes] [Right: Yes] Calf Left: Right: Point of Measurement: 26 cm From Medial Instep 44.5 cm Ankle Left: Right: Point of Measurement: 10 cm From Medial Instep 22.5 cm Vascular Assessment Pulses: Dorsalis Pedis Palpable: [Right:Yes] Posterior Tibial Palpable: [Right:Yes] Electronic Signature(s) Signed: 08/03/2021 4:45:40 PM By: Levora Dredge Entered By: Levora Dredge on 08/03/2021 15:33:16 [name] ([id number]) -------------------------------------------------------------------------------- Multi Wound Chart Details Patient Name: [name]. Date of Service: 08/03/2021 3:00 PM Medical Record Number: [id number] Patient Account Number: [id number] Date of Birth/Sex: [birth year] (71 y.o. F) Treating RN: Levora Dredge Primary Care Dariane Natzke: Rutherford Guys Other Clinician: Referring Rian Busche: Rutherford Guys Treating Treyana Sturgell/Extender: Yaakov Guthrie in Treatment: 18 Vital Signs Height(in): 35 Pulse(bpm): 6 Weight(lbs): 332 Blood Pressure(mmHg): 151/77 Body Mass Index(BMI): 53.6 Temperature(F): 98 Respiratory Rate(breaths/min): 20 Photos: Wound Location: Right, Proximal, Anterior Lower Leg Right Lower Leg Right, Posterior Lower Leg Wounding Event: Gradually Appeared Gradually Appeared Gradually Appeared Primary Etiology: Venous Leg Ulcer Venous Leg Ulcer Venous Leg Ulcer Comorbid History: Cataracts, Arrhythmia, Cataracts, Arrhythmia, Cataracts, Arrhythmia, Hypertension, Type II Diabetes, Hypertension, Type II Diabetes, Hypertension, Type II Diabetes, Osteoarthritis, Neuropathy Osteoarthritis, Neuropathy Osteoarthritis, Neuropathy Date Acquired: 06/08/2021 04/18/2021 06/01/2021 Weeks of Treatment: _12 Wound Status: Open Open Open Wound Recurrence: No No No Clustered Wound: Yes Yes No Measurements L x W x D (cm) 1x0.5x0.1 0.7x0.6x0.1 3x1x0.1 Area (cm) : 0.393 0.33 2.356 Volume (cm) : 0.039 0.033 0.236 % Reduction in Area:  33.30% 98.90% 9.10% % Reduction in Volume: 33.90% 98.90% 8.90% Classification: Partial Thickness Full Thickness Without Exposed Full Thickness Without Exposed Support Structures Support Structures Exudate Amount: Medium Large Medium Exudate Type: Serosanguineous Serosanguineous Serous Exudate Color: red, brown red, brown amber Granulation Amount: Large (67-100%) Large (67-100%) Small (1-33%) Granulation Quality: Red, Pink Red Red, Pink Necrotic Amount: Small (1-33%) Small (1-33%) Large (67-100%) Exposed Structures: Fat Layer (Subcutaneous Tissue): Fat Layer (Subcutaneous Tissue): Fat Layer (Subcutaneous Tissue):  Yes Yes Yes Fascia: No Fascia: No Fascia: No Tendon: No Tendon: No Tendon: No Muscle: No Muscle: No Muscle: No Joint: No Joint: No Joint: No Bone: No Bone: No Bone: No Epithelialization: None None None Treatment Notes Electronic Signature(s) Signed: 08/03/2021 4:45:40 PM By: Levora Dredge Entered By: Levora Dredge on 08/03/2021 15:36:35 [name] ([id number] [name] ([id number]) -------------------------------------------------------------------------------- Ellis Details Patient Name: [name], [name]. Date of Service: 08/03/2021 3:00 PM Medical Record Number: [id number] Patient Account Number: [id number] Date of Birth/Sex: Sep 05, 1950 (71 y.o. F) Treating RN: Levora Dredge Primary Care Avory Mimbs: Rutherford Guys Other Clinician: Referring Devesh Monforte: Rutherford Guys Treating Joakim Huesman/Extender: Yaakov Guthrie in Treatment: 18 Active Inactive Wound/Skin Impairment Nursing Diagnoses: Impaired tissue integrity Goals: Patient/caregiver will verbalize understanding of skin care regimen Date Initiated: 03/30/2021 Date Inactivated: 04/27/2021 Target Resolution Date: 03/30/2021 Goal Status: Met Ulcer/skin breakdown will have a volume reduction of 30% by week 4 Date Initiated: 03/30/2021 Date Inactivated: 05/11/2021 Target  Resolution Date: 04/30/2021 Goal Status: Met Ulcer/skin breakdown will have a volume reduction of 50% by week 8 Date Initiated: 03/30/2021 Date Inactivated: 05/25/2021 Target Resolution Date: 05/30/2021 Goal Status: Met Ulcer/skin breakdown will have a volume reduction of 80% by week 12 Date Initiated: 03/30/2021 Target Resolution Date: 06/30/2021 Goal Status: Active Ulcer/skin breakdown will heal within 14 weeks Date Initiated: 03/30/2021 Target Resolution Date: 07/31/2021 Goal Status: Active Interventions: Assess patient/caregiver ability to obtain necessary supplies Assess patient/caregiver ability to perform ulcer/skin care regimen upon admission and as needed Assess ulceration(s) every visit Treatment Activities: Patient referred to home care : 03/30/2021 Referred to DME Peyson Delao for dressing supplies : 03/30/2021 Skin care regimen initiated : 03/30/2021 Notes: Electronic Signature(s) Signed: 08/03/2021 4:45:40 PM By: Levora Dredge Entered By: Levora Dredge on 08/03/2021 15:33:27 [name] ([id number]) -------------------------------------------------------------------------------- Pain Assessment Details Patient Name: [name]. Date of Service: 08/03/2021 3:00 PM Medical Record Number: [id number] Patient Account Number: [id number] Date of Birth/Sex: 05/06/1951 (71 y.o. F) Treating RN: Levora Dredge Primary Care Akeria Hedstrom: Rutherford Guys Other Clinician: Referring Larue Drawdy: Rutherford Guys Treating Liseth Wann/Extender: Yaakov Guthrie in Treatment: 18 Active Problems Location of Pain Severity and Description of Pain Patient Has Paino Yes Site Locations Rate the pain. Current Pain Level: 2 Pain Management and Medication Current Pain Management: Electronic Signature(s) Signed: 08/03/2021 4:45:40 PM By: Levora Dredge Entered By: Levora Dredge on 08/03/2021 15:17:20 [name]  ([id number]) -------------------------------------------------------------------------------- Patient/Caregiver Education Details Patient Name: [name]. Date of Service: 08/03/2021 3:00 PM Medical Record Number: [id number] Patient Account Number: [id number] Date of Birth/Gender: 1950-07-12 (71 y.o. F) Treating RN: Levora Dredge Primary Care Physician: Rutherford Guys Other Clinician: Referring Physician: Rutherford Guys Treating Physician/Extender: Yaakov Guthrie in Treatment: 36 Education Assessment Education Provided To: Patient Education Topics Provided Wound/Skin Impairment: Handouts: Caring for Your Ulcer Methods: Explain/Verbal Responses: State content correctly Electronic Signature(s) Signed: 08/03/2021 4:45:40 PM By: Levora Dredge Entered By: Levora Dredge on 08/03/2021 16:16:30 [name] ([id number]) -------------------------------------------------------------------------------- Wound Assessment Details Patient Name: [name]. Date of Service: 08/03/2021 3:00 PM Medical Record Number: [id number] Patient Account Number: [id number] Date of Birth/Sex: 11-Dec-1950 (71 y.o. F) Treating RN: Levora Dredge Primary Care Rashidi Loh: Rutherford Guys Other Clinician: Referring Dezire Turk: Rutherford Guys Treating Trek Kimball/Extender: Yaakov Guthrie in Treatment: 18 Wound Status Wound Number: 10 Primary Venous Leg Ulcer Etiology: Wound Location: Right, Proximal, Anterior Lower Leg Wound Status: Open Wounding Event: Gradually Appeared Comorbid Cataracts, Arrhythmia, Hypertension, Type II Diabetes, Date Acquired: 06/08/2021 History: Osteoarthritis, Neuropathy Weeks Of Treatment:  8 Clustered Wound: Yes Photos Wound Measurements Length: (cm) 1 Width: (cm) 0.5 Depth: (cm) 0.1 Area: (cm) 0.393 Volume: (cm) 0.039 % Reduction in Area: 33.3% % Reduction in Volume: 33.9% Epithelialization: None Tunneling: No Undermining: No Wound  Description Classification: Partial Thickness Exudate Amount: Medium Exudate Type: Serosanguineous Exudate Color: red, brown Foul Odor After Cleansing: No Slough/Fibrino Yes Wound Bed Granulation Amount: Large (67-100%) Exposed Structure Granulation Quality: Red, Pink Fascia Exposed: No Necrotic Amount: Small (1-33%) Fat Layer (Subcutaneous Tissue) Exposed: Yes Necrotic Quality: Adherent Slough Tendon Exposed: No Muscle Exposed: No Joint Exposed: No Bone Exposed: No Treatment Notes Wound #10 (Lower Leg) Wound Laterality: Right, Anterior, Proximal Cleanser Wound Cleanser Discharge Instruction: Wash your hands with soap and water. Remove old dressing, discard into plastic bag and place into trash. Cleanse the wound with Wound Cleanser prior to applying a clean dressing using gauze sponges, not tissues or cotton balls. Do not scrub or use excessive force. Pat dry using gauze sponges, not tissue or cotton balls. [name], [name] ([id number]) Peri-Wound Care Topical Gentamicin Discharge Instruction: IN OFFICE ONLY-Apply as directed by Isaia Hassell. Nystatin Cream, 15 (g) tube Discharge Instruction: in office only Primary Dressing Hydrofera Blue Ready Transfer Foam, 4x5 (in/in) Discharge Instruction: Apply Hydrofera Blue Ready to wound bed as directed Secondary Dressing ABD Pad 5x9 (in/in) Discharge Instruction: 3-Cover with ABD pad Secured With Coban Cohesive Bandage 4x5 (yds) Stretched Discharge Instruction: Apply Coban as directed. Kerlix Roll Sterile or Non-Sterile 6-ply 4.5x4 (yd/yd) Discharge Instruction: Apply Kerlix as directed Compression Wrap Compression Stockings Add-Ons Electronic Signature(s) Signed: 08/03/2021 4:45:40 PM By: Levora Dredge Entered By: Levora Dredge on 08/03/2021 15:30:17 [name] ([id number]) -------------------------------------------------------------------------------- Wound Assessment Details Patient Name: [name]. Date of  Service: 08/03/2021 3:00 PM Medical Record Number: [id number] Patient Account Number: [id number] Date of Birth/Sex: [birth year] (71 y.o. F) Treating RN: Levora Dredge Primary Care Astoria Condon: Rutherford Guys Other Clinician: Referring Ramsey Guadamuz: Rutherford Guys Treating Burke Terry/Extender: Yaakov Guthrie in Treatment: 18 Wound Status Wound Number: 11 Primary Venous Leg Ulcer Etiology: Wound Location: Right Lower Leg Wound Status: Open Wounding Event: Gradually Appeared Comorbid Cataracts, Arrhythmia, Hypertension, Type II Diabetes, Date Acquired: 04/18/2021 History: Osteoarthritis, Neuropathy Weeks Of Treatment: 7 Clustered Wound: Yes Photos Wound Measurements Length: (cm) 0.7 Width: (cm) 0.6 Depth: (cm) 0.1 Area: (cm) 0.33 Volume: (cm) 0.033 % Reduction in Area: 98.9% % Reduction in Volume: 98.9% Epithelialization: None Tunneling: No Undermining: No Wound Description Classification: Full Thickness Without Exposed Support Structures Exudate Amount: Large Exudate Type: Serosanguineous Exudate Color: red, brown Foul Odor After Cleansing: No Slough/Fibrino Yes Wound Bed Granulation Amount: Large (67-100%) Exposed Structure Granulation Quality: Red Fascia Exposed: No Necrotic Amount: Small (1-33%) Fat Layer (Subcutaneous Tissue) Exposed: Yes Necrotic Quality: Adherent Slough Tendon Exposed: No Muscle Exposed: No Joint Exposed: No Bone Exposed: No Treatment Notes Wound #11 (Lower Leg) Wound Laterality: Right Cleanser Wound Cleanser Discharge Instruction: Wash your hands with soap and water. Remove old dressing, discard into plastic bag and place into trash. Cleanse the wound with Wound Cleanser prior to applying a clean dressing using gauze sponges, not tissues or cotton balls. Do not scrub or use excessive force. Pat dry using gauze sponges, not tissue or cotton balls. [name], [name] ([id number]) Peri-Wound Care Topical Gentamicin Discharge Instruction: IN  OFFICE ONLY-Apply as directed by Talon Regala. Nystatin Cream, 15 (g) tube Discharge Instruction: in office only Primary Dressing Hydrofera Blue Ready Transfer Foam, 4x5 (in/in) Discharge Instruction: Apply Hydrofera Blue Ready to wound bed as directed Secondary Dressing  Zetuvit Plus Silicone Non-bordered 5x5 (in/in) Discharge Instruction: OR ALTERNATIVE ABSORBANT PAD Secured With Coban Cohesive Bandage 4x5 (yds) Stretched Discharge Instruction: Apply Coban as directed. Kerlix Roll Sterile or Non-Sterile 6-ply 4.5x4 (yd/yd) Discharge Instruction: Apply Kerlix as directed Compression Wrap Compression Stockings Add-Ons Electronic Signature(s) Signed: 08/03/2021 4:45:40 PM By: Levora Dredge Entered By: Levora Dredge on 08/03/2021 15:30:57 [name] ([id number]) -------------------------------------------------------------------------------- Wound Assessment Details Patient Name: [name]. Date of Service: 08/03/2021 3:00 PM Medical Record Number: [id number] Patient Account Number: [id number] Date of Birth/Sex: 04-22-1951 (71 y.o. F) Treating RN: Levora Dredge Primary Care Adhrit Krenz: Rutherford Guys Other Clinician: Referring Roseanne Juenger: Rutherford Guys Treating Kiyomi Pallo/Extender: Yaakov Guthrie in Treatment: 18 Wound Status Wound Number: 9 Primary Venous Leg Ulcer Etiology: Wound Location: Right, Posterior Lower Leg Wound Status: Open Wounding Event: Gradually Appeared Comorbid Cataracts, Arrhythmia, Hypertension, Type II Diabetes, Date Acquired: 06/01/2021 History: Osteoarthritis, Neuropathy Weeks Of Treatment: 9 Clustered Wound: No Photos Wound Measurements Length: (cm) 3 Width: (cm) 1 Depth: (cm) 0.1 Area: (cm) 2.356 Volume: (cm) 0.236 % Reduction in Area: 9.1% % Reduction in Volume: 8.9% Epithelialization: None Tunneling: No Undermining: No Wound Description Classification: Full Thickness Without Exposed Support Structures Exudate Amount:  Medium Exudate Type: Serous Exudate Color: amber Foul Odor After Cleansing: No Slough/Fibrino Yes Wound Bed Granulation Amount: Small (1-33%) Exposed Structure Granulation Quality: Red, Pink Fascia Exposed: No Necrotic Amount: Large (67-100%) Fat Layer (Subcutaneous Tissue) Exposed: Yes Necrotic Quality: Adherent Slough Tendon Exposed: No Muscle Exposed: No Joint Exposed: No Bone Exposed: No Treatment Notes Wound #9 (Lower Leg) Wound Laterality: Right, Posterior Cleanser Wound Cleanser Discharge Instruction: Wash your hands with soap and water. Remove old dressing, discard into plastic bag and place into trash. Cleanse the wound with Wound Cleanser prior to applying a clean dressing using gauze sponges, not tissues or cotton balls. Do not scrub or use excessive force. Pat dry using gauze sponges, not tissue or cotton balls. [name], [name] ([id number]) Peri-Wound Care Topical Gentamicin Discharge Instruction: IN OFFICE ONLY-Apply as directed by Verna Hamon. Nystatin Cream, 15 (g) tube Discharge Instruction: in office only Primary Dressing Hydrofera Blue Ready Transfer Foam, 4x5 (in/in) Discharge Instruction: Apply Hydrofera Blue Ready to wound bed as directed Secondary Dressing Zetuvit Plus Silicone Non-bordered 5x5 (in/in) Discharge Instruction: OR ALTERNATIVE ABSORBANT PAD Secured With Coban Cohesive Bandage 4x5 (yds) Stretched Discharge Instruction: Apply Coban as directed. Kerlix Roll Sterile or Non-Sterile 6-ply 4.5x4 (yd/yd) Discharge Instruction: Apply Kerlix as directed Compression Wrap Compression Stockings Add-Ons Electronic Signature(s) Signed: 08/03/2021 4:45:40 PM By: Levora Dredge Entered By: Levora Dredge on 08/03/2021 15:31:48 [name] ([id number]) -------------------------------------------------------------------------------- Vitals Details Patient Name: [name]. Date of Service: 08/03/2021 3:00 PM Medical Record Number:  [id number] Patient Account Number: [id number] Date of Birth/Sex: 1951/01/10 (71 y.o. F) Treating RN: Levora Dredge Primary Care Analiah Drum: Rutherford Guys Other Clinician: Referring Dot Splinter: Rutherford Guys Treating Markis Langland/Extender: Yaakov Guthrie in Treatment: 18 Vital Signs Time Taken: 15:11 Temperature (F): 98 Height (in): 66 Pulse (bpm): 68 Weight (lbs): 332 Respiratory Rate (breaths/min): 20 Body Mass Index (BMI): 53.6 Blood Pressure (mmHg): 151/77 Reference Range: 80 - 120 mg / dl Electronic Signature(s) Signed: 08/03/2021 4:45:40 PM By: Levora Dredge Entered By: Levora Dredge on 08/03/2021 15:17:06

## 2021-08-04 NOTE — Progress Notes (Signed)
[name], [name] ([id number]) Visit Report for 08/03/2021 Chief Complaint Document Details Patient Name: [name], [name]. Date of Service: 08/03/2021 3:00 PM Medical Record Number: [id number] Patient Account Number: [id number] Date of Birth/Sex: 01/01/1951 (71 y.o. F) Treating RN: [name] Primary Care Provider: Rutherford [name] Other Clinician: Referring Provider: Rutherford [name] Treating Provider/Extender: [name] in Treatment: 18 Information Obtained from: Patient Chief Complaint Bilateral lower extremity wounds Electronic Signature(s) Signed: 08/04/2021 12:34:42 PM By: Miranda Shan DO Entered By: [name] on 08/04/2021 12:24:50 [name] ([id number]) -------------------------------------------------------------------------------- HPI Details Patient Name: [name]. Date of Service: 08/03/2021 3:00 PM Medical Record Number: [id number] Patient Account Number: [id number] Date of Birth/Sex: 02-Nov-1950 (71 y.o. F) Treating RN: [name] Primary Care Provider: Rutherford [name] Other Clinician: Referring Provider: Rutherford [name] Treating Provider/Extender: [name] in Treatment: 18 History of Present Illness HPI Description: Admission 10/5 [name] is a 71 year old female with a past medical history of hypertension, venous insufficiency, chronic diastolic heart failure and insulin-dependent type 2 diabetes that presents To the clinic for bilateral lower extremity wounds. She states that her right lower extremity developed a blister 1 week ago and she has been keeping the area covered with a bandaid. She states that when the Band-Aid came off it tore her skin. She subsequently developed pain, increased warmth and erythema to the right lower extremity. She denies purulent drainage. She has also developed an open wound to the left lower extremity that recently developed. She also has a small open wound to the proximal portion of the  left great toe. This has been present since August and was evaluated by podiatry. It has not healed and she has not followed up with podiatry for this issue. 10/12; patient presents for follow-up. She states she has 2 days left on her antibiotics. She is tolerated this well. She reports improvement to her right lower extremity symptoms. She denies signs of infection. She has home health. 10/26; patient presents for follow-up. She has no issues or complaints today. The compression wraps slid down to her shin. These were placed on Monday by home health. She has a couple of new open wounds due to this. She denies signs of infection. 11/2; patient presents for follow-up. The compression wraps had slid down to her shin again. The wraps taken off today do not appear to be appropriate material for 3 layer compression wrap. She currently denies signs of infection. 11/9; patient presents for follow-up. She was able to increase her diuretic over the past week. Again home health does not have appropriate materials for 3 layer compression. She currently denies signs of infection. 11/16; patient presents for follow-up. She has no issues or complaints today. She currently denies signs of infection. 11/30; patient presents for follow-up. The wrap continues to have a hard time staying in place. Overall she is doing well with no issues or complaints today. 12/7; patient presents for follow-up. She brought her juxta light compression today. She states however she cannot get these on daily. She has no issues or complaints today. 12/14; patient presents for follow-up. She states that the wrap rolled and created a new wound to her right lower extremity. Other than that she has no issues or complaints today. 12/21; patient presents for follow-up. The compression wraps continue to have a hard time staying in place. They again have rolled down her leg. 12/28; patient presents for follow-up. She has no issues or  complaints today. 1/11; patient presents for follow-up. She missed  her last clinic appointment due to transportation issues. She has no issues or complaints today. 1/18; patient presents for follow-up. She is been using her juxta light compression to the left lower extremity without issues. Home health has been changing the wrap on the right leg. She denies signs of infection today. 1/25; patient presents for follow-up. She is continuing to use her juxta light compression to the left lower extremity without issues. Home health has been changing the wrap on the right leg and she denies any issues to this area. She denies signs of infection. 2/9; patient presents for follow-up. She has no issues or complaints today. Electronic Signature(s) Signed: 08/04/2021 12:34:42 PM By: Miranda Shan DO Entered By: [name] on 08/04/2021 12:25:13 [name] ([id number]) -------------------------------------------------------------------------------- Physical Exam Details Patient Name: [name]. Date of Service: 08/03/2021 3:00 PM Medical Record Number: [id number] Patient Account Number: [id number] Date of Birth/Sex: 09-27-1950 (71 y.o. F) Treating RN: [name] Primary Care Provider: Rutherford [name] Other Clinician: Referring Provider: Rutherford [name] Treating Provider/Extender: [name] in Treatment: 18 Constitutional . Cardiovascular . Psychiatric . Notes Patient has scattered wounds limited to skin breakdown to her right lower extremity. No signs of infection. Good edema control. Electronic Signature(s) Signed: 08/04/2021 12:34:42 PM By: Miranda Shan DO Entered By: [name] on 08/04/2021 12:27:53 [name] ([id number]) -------------------------------------------------------------------------------- Physician Orders Details Patient Name: [name]. Date of Service: 08/03/2021 3:00 PM Medical Record Number: [id number] Patient Account Number:  [id number] Date of Birth/Sex: [birth year] (71 y.o. F) Treating RN: [name] Primary Care Provider: Rutherford [name] Other Clinician: Referring Provider: Rutherford [name] Treating Provider/Extender: [name] in Treatment: 68 Verbal / Phone Orders: No Diagnosis Coding Follow-up Appointments o Return Appointment in 1 week. o Nurse Visit as needed [place]: - [facility]-assist learning and application of Juxtelite velcro wrap Left leg o [place] for wound care. May utilize formulary equivalent dressing for wound treatment orders unless otherwise specified. Home Health Nurse may visit PRN to address patientos wound care needs. - SEE additional order for OT eval and treat for deconditioning, unable to don socks and needs to wear Vecro compression therapy o Scheduled days for dressing changes to be completed; exception, patient has scheduled wound care visit that day. o **Please direct any NON-WOUND related issues/requests for orders to patient's Primary Care Physician. **If current dressing causes regression in wound condition, may D/C ordered dressing product/s and apply Normal Saline Moist Dressing daily until next [place] or Other MD appointment. **Notify [facility] of regression in wound condition at [place] Bathing/ Shower/ Hygiene o May shower; gently cleanse wound with antibacterial soap, rinse and pat dry prior to dressing wounds o May shower with wound dressing protected with water repellent cover or cast protector. o No tub bath. Anesthetic (Use 'Patient Medications' Section for Anesthetic Order Entry) o Lidocaine applied to wound bed Edema Control - Lymphedema / Segmental Compressive Device / Other o Optional: One layer of unna paste to top of compression wrap (to act as an anchor). - please use or compression wrap will not stay up-RIGHT LEG o Patient to wear  own Velcro compression garment. Remove compression stockings every night before going to bed and put on every morning when getting up. - START JUXTELITE VELCRO WRAP TO LEFT LEG-assist with don and doff and education as well as assistive device of sock donner o Elevate leg(s) parallel to the floor when sitting. o  DO YOUR BEST to sleep in the bed at night. DO NOT sleep in your recliner. Long hours of sitting in a recliner leads to swelling of the legs and/or potential wounds on your backside. o Other: - Kerlix and Coban RIGHT LEG ONLY-wrap base of toes to behind knee-same as other leg bilateral wrap lower compression Non-Wound Condition o Additional non-wound orders/instructions: - Zinc oxide to any excoriated areas near drainage on right lower leg Additional Orders / Instructions o Follow Nutritious Diet and Increase Protein Intake o Other: - OT EVAL AND TREAT FOR DECONDITIONING-UNABLE TO DON SOCKS/needs to be able to don velcro wraps Medications-Please add to medication list. o Other: - follow with PCP about Lasix and Potassium tabs so that labwork may be done so meds can be adjusted Wound Treatment Wound #10 - Lower Leg Wound Laterality: Right, Anterior, Proximal Cleanser: Wound Cleanser 3 x Per Week/30 Days Discharge Instructions: Wash your hands with soap and water. Remove old dressing, discard into plastic bag and place into trash. Cleanse the wound with Wound Cleanser prior to applying a clean dressing using gauze sponges, not tissues or cotton balls. Do not scrub or use excessive force. Pat dry using gauze sponges, not tissue or cotton balls. Topical: Gentamicin 3 x Per Week/30 Days Discharge Instructions: IN OFFICE ONLY-Apply as directed by provider. Topical: Nystatin Cream, 15 (g) tube 3 x Per Week/30 Days Discharge Instructions: in office only Primary Dressing: Hydrofera Blue Ready Transfer Foam, 4x5 (in/in) 3 x Per Week/30 Days [name], [name] ([id number]) Discharge  Instructions: Apply Hydrofera Blue Ready to wound bed as directed Secondary Dressing: ABD Pad 5x9 (in/in) 3 x Per Week/30 Days Discharge Instructions: 3-Cover with ABD pad Secured With: Coban Cohesive Bandage 4x5 (yds) Stretched 3 x Per Week/30 Days Discharge Instructions: Apply Coban as directed. Secured With: [employer] or Non-Sterile 6-ply 4.5x4 (yd/yd) 3 x Per Week/30 Days Discharge Instructions: Apply Kerlix as directed Wound #11 - Lower Leg Wound Laterality: Right Cleanser: Wound Cleanser 3 x Per Week/30 Days Discharge Instructions: Wash your hands with soap and water. Remove old dressing, discard into plastic bag and place into trash. Cleanse the wound with Wound Cleanser prior to applying a clean dressing using gauze sponges, not tissues or cotton balls. Do not scrub or use excessive force. Pat dry using gauze sponges, not tissue or cotton balls. Topical: Gentamicin 3 x Per Week/30 Days Discharge Instructions: IN OFFICE ONLY-Apply as directed by provider. Topical: Nystatin Cream, 15 (g) tube 3 x Per Week/30 Days Discharge Instructions: in office only Primary Dressing: Hydrofera Blue Ready Transfer Foam, 4x5 (in/in) 3 x Per Week/30 Days Discharge Instructions: Apply Hydrofera Blue Ready to wound bed as directed Secondary Dressing: Zetuvit Plus Silicone Non-bordered 5x5 (in/in) 3 x Per Week/30 Days Discharge Instructions: OR ALTERNATIVE ABSORBANT PAD Secured With: Coban Cohesive Bandage 4x5 (yds) Stretched 3 x Per Week/30 Days Discharge Instructions: Apply Coban as directed. Secured With: [employer] or Non-Sterile 6-ply 4.5x4 (yd/yd) 3 x Per Week/30 Days Discharge Instructions: Apply Kerlix as directed Wound #9 - Lower Leg Wound Laterality: Right, Posterior Cleanser: Wound Cleanser 3 x Per Week/30 Days Discharge Instructions: Wash your hands with soap and water. Remove old dressing, discard into plastic bag and place into trash. Cleanse the wound with Wound Cleanser  prior to applying a clean dressing using gauze sponges, not tissues or cotton balls. Do not scrub or use excessive force. Pat dry using gauze sponges, not tissue or cotton balls. Topical: Gentamicin 3 x Per Week/30  Days Discharge Instructions: IN OFFICE ONLY-Apply as directed by provider. Topical: Nystatin Cream, 15 (g) tube 3 x Per Week/30 Days Discharge Instructions: in office only Primary Dressing: Hydrofera Blue Ready Transfer Foam, 4x5 (in/in) 3 x Per Week/30 Days Discharge Instructions: Apply Hydrofera Blue Ready to wound bed as directed Secondary Dressing: Zetuvit Plus Silicone Non-bordered 5x5 (in/in) 3 x Per Week/30 Days Discharge Instructions: OR ALTERNATIVE ABSORBANT PAD Secured With: Coban Cohesive Bandage 4x5 (yds) Stretched 3 x Per Week/30 Days Discharge Instructions: Apply Coban as directed. Secured With: [employer] or Non-Sterile 6-ply 4.5x4 (yd/yd) 3 x Per Week/30 Days Discharge Instructions: Apply Kerlix as directed Electronic Signature(s) Signed: 08/04/2021 12:34:42 PM By: Miranda Shan DO Previous Signature: 08/03/2021 4:45:40 PM Version By: [name] Entered By: [name] on 08/04/2021 12:34:01 [name] ([id number] [name] ([id number]) -------------------------------------------------------------------------------- Problem List Details Patient Name: [name], [name]. Date of Service: 08/03/2021 3:00 PM Medical Record Number: [id number] Patient Account Number: [id number] Date of Birth/Sex: Apr 16, 1951 (71 y.o. F) Treating RN: [name] Primary Care Provider: Rutherford [name] Other Clinician: Referring Provider: Rutherford [name] Treating Provider/Extender: [name] in Treatment: 18 Active Problems ICD-10 Encounter Code Description Active Date MDM Diagnosis L97.812 Non-pressure chronic ulcer of other part of right lower leg with fat layer 06/08/2021 No Yes exposed I87.331 Chronic venous hypertension (idiopathic) with  ulcer and inflammation of 03/30/2021 No Yes right lower extremity I87.332 Chronic venous hypertension (idiopathic) with ulcer and inflammation of 03/30/2021 No Yes left lower extremity S91.302D Unspecified open wound, left foot, subsequent encounter 03/30/2021 No Yes Inactive Problems Resolved Problems Electronic Signature(s) Signed: 08/04/2021 12:34:42 PM By: Miranda Shan DO Entered By: [name] on 08/04/2021 12:24:43 [name] ([id number]) -------------------------------------------------------------------------------- Progress Note Details Patient Name: [name]. Date of Service: 08/03/2021 3:00 PM Medical Record Number: [id number] Patient Account Number: [id number] Date of Birth/Sex: 03/12/1951 (71 y.o. F) Treating RN: [name] Primary Care Provider: Rutherford [name] Other Clinician: Referring Provider: Rutherford [name] Treating Provider/Extender: [name] in Treatment: 18 Subjective Chief Complaint Information obtained from Patient Bilateral lower extremity wounds History of Present Illness (HPI) Admission 10/5 Ms. Jahara Dail is a 71 year old female with a past medical history of hypertension, venous insufficiency, chronic diastolic heart failure and insulin-dependent type 2 diabetes that presents To the clinic for bilateral lower extremity wounds. She states that her right lower extremity developed a blister 1 week ago and she has been keeping the area covered with a bandaid. She states that when the Band-Aid came off it tore her skin. She subsequently developed pain, increased warmth and erythema to the right lower extremity. She denies purulent drainage. She has also developed an open wound to the left lower extremity that recently developed. She also has a small open wound to the proximal portion of the left great toe. This has been present since August and was evaluated by podiatry. It has not healed and she has not followed up with podiatry  for this issue. 10/12; patient presents for follow-up. She states she has 2 days left on her antibiotics. She is tolerated this well. She reports improvement to her right lower extremity symptoms. She denies signs of infection. She has home health. 10/26; patient presents for follow-up. She has no issues or complaints today. The compression wraps slid down to her shin. These were placed on Monday by home health. She has a couple of new open wounds due to this. She denies signs of infection. 11/2; patient presents for follow-up. The compression  wraps had slid down to her shin again. The wraps taken off today do not appear to be appropriate material for 3 layer compression wrap. She currently denies signs of infection. 11/9; patient presents for follow-up. She was able to increase her diuretic over the past week. Again home health does not have appropriate materials for 3 layer compression. She currently denies signs of infection. 11/16; patient presents for follow-up. She has no issues or complaints today. She currently denies signs of infection. 11/30; patient presents for follow-up. The wrap continues to have a hard time staying in place. Overall she is doing well with no issues or complaints today. 12/7; patient presents for follow-up. She brought her juxta light compression today. She states however she cannot get these on daily. She has no issues or complaints today. 12/14; patient presents for follow-up. She states that the wrap rolled and created a new wound to her right lower extremity. Other than that she has no issues or complaints today. 12/21; patient presents for follow-up. The compression wraps continue to have a hard time staying in place. They again have rolled down her leg. 12/28; patient presents for follow-up. She has no issues or complaints today. 1/11; patient presents for follow-up. She missed her last clinic appointment due to transportation issues. She has no issues or  complaints today. 1/18; patient presents for follow-up. She is been using her juxta light compression to the left lower extremity without issues. Home health has been changing the wrap on the right leg. She denies signs of infection today. 1/25; patient presents for follow-up. She is continuing to use her juxta light compression to the left lower extremity without issues. Home health has been changing the wrap on the right leg and she denies any issues to this area. She denies signs of infection. 2/9; patient presents for follow-up. She has no issues or complaints today. Objective Constitutional Vitals Time Taken: 3:11 PM, Height: 66 in, Weight: 332 lbs, BMI: 53.6, Temperature: 98 F, Pulse: 68 bpm, Respiratory Rate: 20 breaths/min, [name], Miranda F. ([id number]) Blood Pressure: 151/77 mmHg. General Notes: Patient has scattered wounds limited to skin breakdown to her right lower extremity. No signs of infection. Good edema control. Integumentary (Hair, Skin) Wound #10 status is Open. Original cause of wound was Gradually Appeared. The date acquired was: 06/08/2021. The wound has been in treatment 8 weeks. The wound is located on the Right,Proximal,Anterior Lower Leg. The wound measures 1cm length x 0.5cm width x 0.1cm depth; 0.393cm^2 area and 0.039cm^3 volume. There is Fat Layer (Subcutaneous Tissue) exposed. There is no tunneling or undermining noted. There is a medium amount of serosanguineous drainage noted. There is large (67-100%) red, pink granulation within the wound bed. There is a small (1-33%) amount of necrotic tissue within the wound bed including Adherent Slough. Wound #11 status is Open. Original cause of wound was Gradually Appeared. The date acquired was: 04/18/2021. The wound has been in treatment 7 weeks. The wound is located on the Right Lower Leg. The wound measures 0.7cm length x 0.6cm width x 0.1cm depth; 0.33cm^2 area and 0.033cm^3 volume. There is Fat Layer (Subcutaneous  Tissue) exposed. There is no tunneling or undermining noted. There is a large amount of serosanguineous drainage noted. There is large (67-100%) red granulation within the wound bed. There is a small (1-33%) amount of necrotic tissue within the wound bed including Adherent Slough. Wound #9 status is Open. Original cause of wound was Gradually Appeared. The date acquired was: 06/01/2021. The wound has  been in treatment 9 weeks. The wound is located on the Right,Posterior Lower Leg. The wound measures 3cm length x 1cm width x 0.1cm depth; 2.356cm^2 area and 0.236cm^3 volume. There is Fat Layer (Subcutaneous Tissue) exposed. There is no tunneling or undermining noted. There is a medium amount of serous drainage noted. There is small (1-33%) red, pink granulation within the wound bed. There is a large (67-100%) amount of necrotic tissue within the wound bed including Adherent Slough. 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 I recommended continuing with the Hydrofera Blue under Kerlix/Coban. We will add gentamicin in the office only to address any bioburden and patient reports itching under the wrap so we will also add nystatin cream in office only. Follow-up in 1 week. Plan Follow-up Appointments: Return Appointment in 1 week. Nurse Visit as needed Home Health: [place]: - [facility]assist learning and application of Juxtelite velcro wrap Left leg [place] for wound care. May utilize formulary equivalent dressing for wound treatment orders unless otherwise specified.  Home Health Nurse may visit PRN to address patient s wound care needs. - SEE additional order for OT eval and treat for deconditioning, unable to don socks and needs to wear Vecro compression therapy Scheduled days for dressing changes to be completed; exception, patient has scheduled wound care visit that day. **Please direct any NON-WOUND related issues/requests for orders to patient's Primary Care Physician. **If current dressing causes regression in wound condition, may D/C ordered dressing product/s and apply Normal Saline Moist Dressing daily until next [place] or Other MD appointment. **Notify [facility] of regression in wound condition at [place] Bathing/ Shower/ Hygiene: May shower; gently cleanse wound with antibacterial soap, rinse and pat dry prior to dressing wounds May shower with wound dressing protected with water repellent cover or cast protector. No tub bath. Anesthetic (Use 'Patient Medications' Section for Anesthetic Order Entry): Lidocaine applied to wound bed Edema Control - Lymphedema / Segmental Compressive Device / Other: Optional: One layer of unna paste to top of compression wrap (to act as an anchor). - please use or compression wrap will not stay up-RIGHT LEG Patient to wear own Velcro compression garment. Remove compression stockings every night before going to bed and put on every morning when getting up. - START JUXTELITE VELCRO WRAP TO LEFT LEG-assist with don and doff and education as well as assistive device of sock donner Elevate leg(s) parallel to the floor when sitting. DO YOUR BEST to sleep in the bed at night. DO NOT sleep in your recliner. Long hours of sitting in a recliner leads to swelling of the legs and/or potential wounds on your backside. Other: - Kerlix and Coban RIGHT LEG ONLY-wrap base of toes to behind knee-same as other leg bilateral wrap lower compression Non-Wound Condition: [name], [name]. ([id number]) Additional non-wound orders/instructions: - Zinc oxide to any excoriated areas near drainage on right lower leg Additional Orders / Instructions: Follow Nutritious Diet and Increase Protein Intake Other: - OT EVAL AND TREAT FOR DECONDITIONING-UNABLE TO DON SOCKS/needs to be able to don velcro wraps Medications-Please add to medication list.: Other: - follow with PCP about Lasix  and Potassium tabs so that labwork may be done so meds can be adjusted WOUND #10: - Lower Leg Wound Laterality: Right, Anterior, Proximal Cleanser: Wound Cleanser 3 x Per Week/30 Days Discharge Instructions: Wash your hands with soap and water. Remove old dressing, discard into plastic bag and place into trash. Cleanse the wound with Wound Cleanser prior to applying a clean dressing using gauze sponges, not tissues or cotton balls. Do not scrub or use excessive force. Pat dry using gauze sponges, not tissue or cotton balls. Topical: Gentamicin 3 x Per Week/30 Days Discharge Instructions: IN OFFICE ONLY-Apply as directed by provider. Topical: Nystatin Cream, 15 (g) tube 3 x Per Week/30 Days Discharge Instructions: in office only Primary Dressing: Hydrofera Blue Ready Transfer Foam, 4x5 (in/in) 3 x Per Week/30 Days Discharge Instructions: Apply Hydrofera Blue Ready to wound bed as directed Secondary Dressing: ABD Pad 5x9 (in/in) 3 x Per Week/30 Days Discharge Instructions: 3-Cover with ABD pad Secured With: Coban Cohesive Bandage 4x5 (yds) Stretched 3 x Per Week/30 Days Discharge Instructions: Apply Coban as directed. Secured With: [employer] or Non-Sterile 6-ply 4.5x4 (yd/yd) 3 x Per Week/30 Days Discharge Instructions: Apply Kerlix as directed WOUND #11: - Lower Leg Wound Laterality: Right Cleanser: Wound Cleanser 3 x Per Week/30 Days Discharge Instructions: Wash your hands with soap and water. Remove old dressing, discard into plastic bag and place into trash. Cleanse the wound with  Wound Cleanser prior to applying a clean dressing using gauze sponges, not tissues or cotton balls. Do not scrub or use excessive force. Pat dry using gauze sponges, not tissue or cotton balls. Topical: Gentamicin 3 x Per Week/30 Days Discharge Instructions: IN OFFICE ONLY-Apply as directed by provider. Topical: Nystatin Cream, 15 (g) tube 3 x Per Week/30 Days Discharge Instructions: in office only Primary Dressing: Hydrofera Blue Ready Transfer Foam, 4x5 (in/in) 3 x Per Week/30 Days Discharge Instructions: Apply Hydrofera Blue Ready to wound bed as directed Secondary Dressing: Zetuvit Plus Silicone Non-bordered 5x5 (in/in) 3 x Per Week/30 Days Discharge Instructions: OR ALTERNATIVE ABSORBANT PAD Secured With: Coban Cohesive Bandage 4x5 (yds) Stretched 3 x Per Week/30 Days Discharge Instructions: Apply Coban as directed. Secured With: [employer] or Non-Sterile 6-ply 4.5x4 (yd/yd) 3 x Per Week/30 Days Discharge Instructions: Apply Kerlix as directed WOUND #9: - Lower Leg Wound Laterality: Right, Posterior Cleanser: Wound Cleanser 3 x Per Week/30 Days Discharge Instructions: Wash your hands with soap and water. Remove old dressing, discard into plastic bag and place into trash. Cleanse the wound with Wound Cleanser prior to applying a clean dressing using gauze sponges, not tissues or cotton balls. Do not scrub or use excessive force. Pat dry using gauze sponges, not tissue or cotton balls. Topical: Gentamicin 3 x Per Week/30 Days Discharge Instructions: IN OFFICE ONLY-Apply as directed by provider. Topical: Nystatin Cream, 15 (g) tube 3 x Per Week/30 Days Discharge Instructions: in office only Primary Dressing: Hydrofera Blue Ready Transfer Foam, 4x5 (in/in) 3 x Per Week/30 Days Discharge Instructions: Apply Hydrofera Blue Ready to wound bed as directed Secondary Dressing: Zetuvit Plus Silicone Non-bordered 5x5 (in/in) 3 x Per Week/30 Days Discharge Instructions: OR ALTERNATIVE  ABSORBANT PAD Secured With: Coban Cohesive Bandage 4x5 (yds) Stretched 3 x Per Week/30 Days Discharge Instructions: Apply Coban as directed. Secured With: [employer] or Non-Sterile 6-ply 4.5x4 (yd/yd) 3 x Per Week/30 Days Discharge Instructions: Apply Kerlix as directed 1. Hydrofera Blue under Kerlix/Coban 2. Follow-up in 1 week Electronic Signature(s) Signed: 08/04/2021 12:34:42 PM  By: Miranda Shan DO Entered By: [name] on 08/04/2021 12:31:09 [name] ([id number]) -------------------------------------------------------------------------------- SuperBill Details Patient Name: [name]. Date of Service: 08/03/2021 Medical Record Number: [id number] Patient Account Number: [id number] Date of Birth/Sex: 1951-01-01 (71 y.o. F) Treating RN: [name] Primary Care Provider: Rutherford [name] Other Clinician: Referring Provider: Rutherford [name] Treating Provider/Extender: [name] in Treatment: 18 Diagnosis Coding ICD-10 Codes Code Description [phone] Non-pressure chronic ulcer of other part of right lower leg with fat layer exposed I87.331 Chronic venous hypertension (idiopathic) with ulcer and inflammation of right lower extremity I87.332 Chronic venous hypertension (idiopathic) with ulcer and inflammation of left lower extremity S91.302D Unspecified open wound, left foot, subsequent encounter Facility Procedures CPT4 Code: [id number] Description: 99214 - WOUND CARE VISIT-LEV 4 EST PT Modifier: Quantity: 1 Physician Procedures CPT4 Code Description: [id number] 99213 - WC PHYS LEVEL 3 - EST PT Modifier: Quantity: 1 CPT4 Code Description: ICD-10 Diagnosis Description J03.009 Non-pressure chronic ulcer of other part of right lower leg with fat lay I87.331 Chronic venous hypertension (idiopathic) with ulcer and inflammation of Modifier: er exposed right lower extremit Quantity: y Engineer, maintenance) Signed: 08/04/2021 12:34:42 PM By: Miranda Shan DO Previous Signature: 08/03/2021 4:16:12 PM Version By: [name] Entered By: [name] on 08/04/2021 12:33:36

## 2021-08-10 ENCOUNTER — Encounter (HOSPITAL_BASED_OUTPATIENT_CLINIC_OR_DEPARTMENT_OTHER): Payer: Medicare Other | Admitting: Internal Medicine

## 2021-08-10 ENCOUNTER — Other Ambulatory Visit: Payer: Self-pay

## 2021-08-10 DIAGNOSIS — I87331 Chronic venous hypertension (idiopathic) with ulcer and inflammation of right lower extremity: Secondary | ICD-10-CM | POA: Diagnosis not present

## 2021-08-10 DIAGNOSIS — I87332 Chronic venous hypertension (idiopathic) with ulcer and inflammation of left lower extremity: Secondary | ICD-10-CM | POA: Diagnosis not present

## 2021-08-10 DIAGNOSIS — L97812 Non-pressure chronic ulcer of other part of right lower leg with fat layer exposed: Secondary | ICD-10-CM

## 2021-08-10 DIAGNOSIS — S91302D Unspecified open wound, left foot, subsequent encounter: Secondary | ICD-10-CM

## 2021-08-10 NOTE — Progress Notes (Signed)
[name], [name] ([id number]) Visit Report for 08/10/2021 Arrival Information Details Patient Name: [name], [name]. Date of Service: 08/10/2021 3:00 PM Medical Record Number: [id number] Patient Account Number: [id number] Date of Birth/Sex: August 23, 1950 (71 y.o. F) Treating RN: Levora Dredge Primary Care Mariyah Upshaw: Rutherford Guys Other Clinician: Referring Mercedies Ganesh: Rutherford Guys Treating Mareli Antunes/Extender: Yaakov Guthrie in Treatment: 19 Visit Information History Since Last Visit Added or deleted any medications: Yes Patient Arrived: Walker Any new allergies or adverse reactions: No Arrival Time: 15:14 Had a fall or experienced change in No Accompanied By: self activities of daily living that may affect Transfer Assistance: None risk of falls: Patient Requires Transmission-Based No Hospitalized since last visit: No Precautions: Has Dressing in Place as Prescribed: Yes Patient Has Alerts: Yes Has Compression in Place as Prescribed: Yes Patient Alerts: Patient on Blood Pain Present Now: No Thinner DIABETIC ELIQUIS Electronic Signature(s) Signed: 08/10/2021 5:33:03 PM By: Levora Dredge Entered By: Levora Dredge on 08/10/2021 15:17:40 [name] ([id number]) -------------------------------------------------------------------------------- Clinic Level of Care Assessment Details Patient Name: [name]. Date of Service: 08/10/2021 3:00 PM Medical Record Number: [id number] Patient Account Number: [id number] Date of Birth/Sex: 06/30/1950 (71 y.o. F) Treating RN: Levora Dredge Primary Care Daneka Lantigua: Rutherford Guys Other Clinician: Referring Blake Vetrano: Rutherford Guys Treating Syeda Prickett/Extender: Yaakov Guthrie in Treatment: 19 Clinic Level of Care Assessment Items TOOL 4 Quantity Score _0  - Use when only an EandM is performed on FOLLOW-UP visit 0 ASSESSMENTS - Nursing Assessment / Reassessment _1  - Reassessment of Co-morbidities (includes updates in patient  status) 0 _2  - 0 Reassessment of Adherence to Treatment Plan ASSESSMENTS - Wound and Skin Assessment / Reassessment _3  - Simple Wound Assessment / Reassessment - one wound 0 X- 3 5 Complex Wound Assessment / Reassessment - multiple wounds _4  - 0 Dermatologic / Skin Assessment (not related to wound area) ASSESSMENTS - Focused Assessment _5  - Circumferential Edema Measurements - multi extremities 0 _6  - 0 Nutritional Assessment / Counseling / Intervention _7  - 0 Lower Extremity Assessment (monofilament, tuning fork, pulses) _8  - 0 Peripheral Arterial Disease Assessment (using hand held doppler) ASSESSMENTS - Ostomy and/or Continence Assessment and Care _9  - Incontinence Assessment and Management 0 _10  - 0 Ostomy Care Assessment and Management (repouching, etc.) PROCESS - Coordination of Care X - Simple Patient / Family Education for ongoing care 1 15 _11  - 0 Complex (extensive) Patient / Family Education for ongoing care _12  - 0 Staff obtains Programmer, systems, Records, Test Results / Process Orders _13  - 0 Staff telephones HHA, Nursing Homes / Clarify orders / etc _14  - 0 Routine Transfer to another Facility (non-emergent condition) _15  - 0 Routine Hospital Admission (non-emergent condition) _16  - 0 New Admissions / Biomedical engineer / Ordering NPWT, Apligraf, etc. _17  - 0 Emergency Hospital Admission (emergent condition) X- 1 10 Simple Discharge Coordination _18  - 0 Complex (extensive) Discharge Coordination PROCESS - Special Needs _19  - Pediatric / Minor Patient Management 0 _20  - 0 Isolation Patient Management _21  - 0 Hearing / Language / Visual special needs _22  - 0 Assessment of Community assistance (transportation, D/C planning, etc.) _23  - 0 Additional assistance / Altered mentation _24  - 0 Support Surface(s) Assessment (bed, cushion, seat, etc.) INTERVENTIONS - Wound Cleansing / Measurement [name], [name]. ([id number]) _25  - 0 Simple Wound Cleansing - one wound X- 3  5 Complex Wound Cleansing - multiple wounds X- 1 5 Wound Imaging (photographs - any number of wounds) _26  - 0 Wound Tracing (instead of photographs) _27  - 0 Simple  Wound Measurement - one wound X- 3 5 Complex Wound Measurement - multiple wounds INTERVENTIONS - Wound Dressings X - Small Wound Dressing one or multiple wounds 3 10 _0  - 0 Medium Wound Dressing one or multiple wounds _1  - 0 Large Wound Dressing one or multiple wounds <OEVOJJKKXFGHWEXH>_3<\/ZJIRCVELFYBOFBPZ>_0  - 0 Application of Medications - topical <CHENIDPOEUMPNTIR>_4<\/ERXVQMGQQPYPPJKD>_3  - 0 Application of Medications - injection INTERVENTIONS - Miscellaneous _4  - External ear exam 0 _5  - 0 Specimen Collection (cultures, biopsies, blood, body fluids, etc.) _6  - 0 Specimen(s) / Culture(s) sent or taken to Lab for analysis _7  - 0 Patient Transfer (multiple staff / Civil Service fast streamer / Similar devices) _8  - 0 Simple Staple / Suture removal (25 or less) _9  - 0 Complex Staple / Suture removal (26 or more) _10  - 0 Hypo / Hyperglycemic Management (close monitor of Blood Glucose) _11  - 0 Ankle / Brachial Index (ABI) - do not check if billed separately X- 1 5 Vital Signs Has the patient been seen at the hospital within the last three years: Yes Total Score: 110 Level Of Care: New/Established - Level 3 Electronic Signature(s) Signed: 08/10/2021 5:33:03 PM By: Levora Dredge Entered By: Levora Dredge on 08/10/2021 17:21:26 [name] ([id number]) -------------------------------------------------------------------------------- Encounter Discharge Information Details Patient Name: [name]. Date of Service: 08/10/2021 3:00 PM Medical Record Number: [id number] Patient Account Number: [id number] Date of Birth/Sex: [birth year] (71 y.o. F) Treating RN: Levora Dredge Primary Care Ilka Lovick: Rutherford Guys Other Clinician: Referring Panfilo Ketchum: Rutherford Guys Treating Seema Blum/Extender: Yaakov Guthrie in Treatment: 19 Encounter Discharge Information Items Discharge Condition:  Stable Ambulatory Status: Walker Discharge Destination: Home Transportation: Other Accompanied By: self Schedule Follow-up Appointment: Yes Clinical Summary of Care: Electronic Signature(s) Signed: 08/10/2021 5:23:40 PM By: Levora Dredge Entered By: Levora Dredge on 08/10/2021 17:23:40 [name] ([id number]) -------------------------------------------------------------------------------- Lower Extremity Assessment Details Patient Name: [name]. Date of Service: 08/10/2021 3:00 PM Medical Record Number: [id number] Patient Account Number: [id number] Date of Birth/Sex: 04-05-1951 (71 y.o. F) Treating RN: Levora Dredge Primary Care Marqus Macphee: Rutherford Guys Other Clinician: Referring Bearett Porcaro: Rutherford Guys Treating Kemesha Mosey/Extender: Yaakov Guthrie in Treatment: 19 Edema Assessment Assessed: [Left: No] [Right: No] [Left: Edema] [Right: :] Calf Left: Right: Point of Measurement: 26 cm From Medial Instep 48 cm Ankle Left: Right: Point of Measurement: 10 cm From Medial Instep 22.5 cm Vascular Assessment Pulses: Dorsalis Pedis Palpable: [Right:Yes] Electronic Signature(s) Signed: 08/10/2021 5:33:03 PM By: Levora Dredge Entered By: Levora Dredge on 08/10/2021 15:36:41 [name] ([id number]) -------------------------------------------------------------------------------- Multi Wound Chart Details Patient Name: [name]. Date of Service: 08/10/2021 3:00 PM Medical Record Number: [id number] Patient Account Number: [id number] Date of Birth/Sex: [birth year] (71 y.o. F) Treating RN: Levora Dredge Primary Care Haru Shaff: Rutherford Guys Other Clinician: Referring Karrington Mccravy: Rutherford Guys Treating Oluwatosin Higginson/Extender: Yaakov Guthrie in Treatment: 19 Vital Signs Height(in): 65 Pulse(bpm): 28 Weight(lbs): 332 Blood Pressure(mmHg): 156/76 Body Mass Index(BMI): 53.6 Temperature(F): 97.8 Respiratory Rate(breaths/min): 20 Photos: Wound  Location: Right, Proximal, Anterior Lower Leg Right Lower Leg Right, Posterior Lower Leg Wounding Event: Gradually Appeared Gradually Appeared Gradually Appeared Primary Etiology: Venous Leg Ulcer Venous Leg Ulcer Venous Leg Ulcer Comorbid History: Cataracts, Arrhythmia, Cataracts, Arrhythmia, Cataracts, Arrhythmia, Hypertension, Type II Diabetes, Hypertension, Type II Diabetes, Hypertension, Type II Diabetes, Osteoarthritis, Neuropathy Osteoarthritis, Neuropathy Osteoarthritis, Neuropathy Date Acquired: 06/08/2021 04/18/2021 06/01/2021 Weeks of Treatment: _12 Wound Status: Open Open Open Wound Recurrence: No No No Clustered Wound: Yes Yes No Measurements L x W x D (cm) 3x4x0.1 0.9x0.6x0.1 7x1x0.1 Area (  cm) : 9.425 0.424 5.498 Volume (cm) : 0.942 0.042 0.55 % Reduction in Area: -1500.20% 98.60% -112.10% % Reduction in Volume: -1496.60% 98.60% -112.40% Classification: Partial Thickness Full Thickness Without Exposed Full Thickness Without Exposed Support Structures Support Structures Exudate Amount: Medium Large Medium Exudate Type: Serosanguineous Serosanguineous Serous Exudate Color: red, brown red, brown amber Granulation Amount: Large (67-100%) Large (67-100%) Small (1-33%) Granulation Quality: Red, Pink Red Red, Pink Necrotic Amount: Small (1-33%) Small (1-33%) Large (67-100%) Exposed Structures: Fat Layer (Subcutaneous Tissue): Fat Layer (Subcutaneous Tissue): Fat Layer (Subcutaneous Tissue): Yes Yes Yes Fascia: No Fascia: No Fascia: No Tendon: No Tendon: No Tendon: No Muscle: No Muscle: No Muscle: No Joint: No Joint: No Joint: No Bone: No Bone: No Bone: No Epithelialization: Small (1-33%) None None Treatment Notes Electronic Signature(s) Signed: 08/10/2021 5:33:03 PM By: Levora Dredge Entered By: Levora Dredge on 08/10/2021 15:36:59 [name] ([id number] [name]  ([id number]) -------------------------------------------------------------------------------- [place] Details Patient Name: [name], [name]. Date of Service: 08/10/2021 3:00 PM Medical Record Number: [id number] Patient Account Number: [id number] Date of Birth/Sex: 1950-10-26 (71 y.o. F) Treating RN: Levora Dredge Primary Care Monserrath Junio: Rutherford Guys Other Clinician: Referring Hines Kloss: Rutherford Guys Treating Kyi Romanello/Extender: Yaakov Guthrie in Treatment: 19 Active Inactive Wound/Skin Impairment Nursing Diagnoses: Impaired tissue integrity Goals: Patient/caregiver will verbalize understanding of skin care regimen Date Initiated: 03/30/2021 Date Inactivated: 04/27/2021 Target Resolution Date: 03/30/2021 Goal Status: Met Ulcer/skin breakdown will have a volume reduction of 30% by week 4 Date Initiated: 03/30/2021 Date Inactivated: 05/11/2021 Target Resolution Date: 04/30/2021 Goal Status: Met Ulcer/skin breakdown will have a volume reduction of 50% by week 8 Date Initiated: 03/30/2021 Date Inactivated: 05/25/2021 Target Resolution Date: 05/30/2021 Goal Status: Met Ulcer/skin breakdown will have a volume reduction of 80% by week 12 Date Initiated: 03/30/2021 Target Resolution Date: 06/30/2021 Goal Status: Active Ulcer/skin breakdown will heal within 14 weeks Date Initiated: 03/30/2021 Target Resolution Date: 07/31/2021 Goal Status: Active Interventions: Assess patient/caregiver ability to obtain necessary supplies Assess patient/caregiver ability to perform ulcer/skin care regimen upon admission and as needed Assess ulceration(s) every visit Treatment Activities: Patient referred to home care : 03/30/2021 Referred to DME Shelvy Heckert for dressing supplies : 03/30/2021 Skin care regimen initiated : 03/30/2021 Notes: Electronic Signature(s) Signed: 08/10/2021 5:33:03 PM By: Levora Dredge Entered By: Levora Dredge on 08/10/2021 15:36:48 [name]  ([id number]) -------------------------------------------------------------------------------- Pain Assessment Details Patient Name: [name]. Date of Service: 08/10/2021 3:00 PM Medical Record Number: [id number] Patient Account Number: [id number] Date of Birth/Sex: 01-Mar-1951 (71 y.o. F) Treating RN: Levora Dredge Primary Care Zaiah Eckerson: Rutherford Guys Other Clinician: Referring Marqual Mi: Rutherford Guys Treating Randie Tallarico/Extender: Yaakov Guthrie in Treatment: 19 Active Problems Location of Pain Severity and Description of Pain Patient Has Paino No Site Locations Rate the pain. Current Pain Level: 0 Pain Management and Medication Current Pain Management: Electronic Signature(s) Signed: 08/10/2021 5:33:03 PM By: Levora Dredge Entered By: Levora Dredge on 08/10/2021 15:21:30 [name] ([id number]) -------------------------------------------------------------------------------- Patient/Caregiver Education Details Patient Name: [name]. Date of Service: 08/10/2021 3:00 PM Medical Record Number: [id number] Patient Account Number: [id number] Date of Birth/Gender: 04-24-1951 (71 y.o. F) Treating RN: Levora Dredge Primary Care Physician: Rutherford Guys Other Clinician: Referring Physician: Rutherford Guys Treating Physician/Extender: Yaakov Guthrie in Treatment: 28 Education Assessment Education Provided To: Patient Education Topics Provided Wound/Skin Impairment: Handouts: Caring for Your Ulcer Methods: Explain/Verbal Responses: State content correctly Electronic Signature(s) Signed: 08/10/2021 5:33:03 PM By: Levora Dredge Entered By: Levora Dredge on 08/10/2021 17:22:05 Bernita Raisin  F. ([id number]) -------------------------------------------------------------------------------- Wound Assessment Details Patient Name: [name], [name]. Date of Service: 08/10/2021 3:00 PM Medical Record Number: [id number] Patient Account Number: [id number] Date  of Birth/Sex: May 16, 1951 (71 y.o. F) Treating RN: Levora Dredge Primary Care Brean Carberry: Rutherford Guys Other Clinician: Referring Lashuna Tamashiro: Rutherford Guys Treating Boleslaw Borghi/Extender: Yaakov Guthrie in Treatment: 19 Wound Status Wound Number: 10 Primary Venous Leg Ulcer Etiology: Wound Location: Right, Proximal, Anterior Lower Leg Wound Status: Open Wounding Event: Gradually Appeared Comorbid Cataracts, Arrhythmia, Hypertension, Type II Diabetes, Date Acquired: 06/08/2021 History: Osteoarthritis, Neuropathy Weeks Of Treatment: 9 Clustered Wound: Yes Photos Wound Measurements Length: (cm) 3 Width: (cm) 4 Depth: (cm) 0.1 Area: (cm) 9.425 Volume: (cm) 0.942 % Reduction in Area: -1500.2% % Reduction in Volume: -1496.6% Epithelialization: Small (1-33%) Tunneling: No Undermining: No Wound Description Classification: Partial Thickness Exudate Amount: Medium Exudate Type: Serosanguineous Exudate Color: red, brown Foul Odor After Cleansing: No Slough/Fibrino Yes Wound Bed Granulation Amount: Large (67-100%) Exposed Structure Granulation Quality: Red, Pink Fascia Exposed: No Necrotic Amount: Small (1-33%) Fat Layer (Subcutaneous Tissue) Exposed: Yes Necrotic Quality: Adherent Slough Tendon Exposed: No Muscle Exposed: No Joint Exposed: No Bone Exposed: No Treatment Notes Wound #10 (Lower Leg) Wound Laterality: Right, Anterior, Proximal Cleanser Wound Cleanser Discharge Instruction: Wash your hands with soap and water. Remove old dressing, discard into plastic bag and place into trash. Cleanse the wound with Wound Cleanser prior to applying a clean dressing using gauze sponges, not tissues or cotton balls. Do not scrub or use excessive force. Pat dry using gauze sponges, not tissue or cotton balls. [name], [name] ([id number]) Peri-Wound Care Topical Primary Dressing Hydrofera Blue Ready Transfer Foam, 4x5 (in/in) Discharge Instruction: Apply Hydrofera Blue Ready  to wound bed as directed Secondary Dressing ABD Pad 5x9 (in/in) Discharge Instruction: 3-Cover with ABD pad Secured With Coban Cohesive Bandage 4x5 (yds) Stretched Discharge Instruction: Apply Coban as directed. Kerlix Roll Sterile or Non-Sterile 6-ply 4.5x4 (yd/yd) Discharge Instruction: Apply Kerlix as directed Compression Wrap Compression Stockings Add-Ons Electronic Signature(s) Signed: 08/10/2021 5:33:03 PM By: Levora Dredge Entered By: Levora Dredge on 08/10/2021 15:34:48 [name] ([id number]) -------------------------------------------------------------------------------- Wound Assessment Details Patient Name: [name]. Date of Service: 08/10/2021 3:00 PM Medical Record Number: [id number] Patient Account Number: [id number] Date of Birth/Sex: May 20, 1951 (71 y.o. F) Treating RN: Levora Dredge Primary Care Shenell Rogalski: Rutherford Guys Other Clinician: Referring Marjean Imperato: Rutherford Guys Treating Zaydyn Havey/Extender: Yaakov Guthrie in Treatment: 19 Wound Status Wound Number: 11 Primary Venous Leg Ulcer Etiology: Wound Location: Right Lower Leg Wound Status: Open Wounding Event: Gradually Appeared Comorbid Cataracts, Arrhythmia, Hypertension, Type II Diabetes, Date Acquired: 04/18/2021 History: Osteoarthritis, Neuropathy Weeks Of Treatment: 8 Clustered Wound: Yes Photos Wound Measurements Length: (cm) 0.9 Width: (cm) 0.6 Depth: (cm) 0.1 Area: (cm) 0.424 Volume: (cm) 0.042 % Reduction in Area: 98.6% % Reduction in Volume: 98.6% Epithelialization: None Tunneling: No Undermining: No Wound Description Classification: Full Thickness Without Exposed Support Structures Exudate Amount: Large Exudate Type: Serosanguineous Exudate Color: red, brown Foul Odor After Cleansing: No Slough/Fibrino Yes Wound Bed Granulation Amount: Large (67-100%) Exposed Structure Granulation Quality: Red Fascia Exposed: No Necrotic Amount: Small (1-33%) Fat Layer  (Subcutaneous Tissue) Exposed: Yes Necrotic Quality: Adherent Slough Tendon Exposed: No Muscle Exposed: No Joint Exposed: No Bone Exposed: No Treatment Notes Wound #11 (Lower Leg) Wound Laterality: Right Cleanser Wound Cleanser Discharge Instruction: Wash your hands with soap and water. Remove old dressing, discard into plastic bag and place into trash. Cleanse the wound with Wound Cleanser prior to applying a  clean dressing using gauze sponges, not tissues or cotton balls. Do not scrub or use excessive force. Pat dry using gauze sponges, not tissue or cotton balls. [name], [name] ([id number]) Peri-Wound Care Topical Primary Dressing Hydrofera Blue Ready Transfer Foam, 4x5 (in/in) Discharge Instruction: Apply Hydrofera Blue Ready to wound bed as directed Secondary Dressing Zetuvit Plus Silicone Non-bordered 5x5 (in/in) Discharge Instruction: OR ALTERNATIVE ABSORBANT PAD Secured With Coban Cohesive Bandage 4x5 (yds) Stretched Discharge Instruction: Apply Coban as directed. Kerlix Roll Sterile or Non-Sterile 6-ply 4.5x4 (yd/yd) Discharge Instruction: Apply Kerlix as directed Compression Wrap Compression Stockings Add-Ons Electronic Signature(s) Signed: 08/10/2021 5:33:03 PM By: Levora Dredge Entered By: Levora Dredge on 08/10/2021 15:35:28 [name] ([id number]) -------------------------------------------------------------------------------- Wound Assessment Details Patient Name: [name]. Date of Service: 08/10/2021 3:00 PM Medical Record Number: [id number] Patient Account Number: [id number] Date of Birth/Sex: 1951-02-04 (71 y.o. F) Treating RN: Levora Dredge Primary Care Jhene Westmoreland: Rutherford Guys Other Clinician: Referring Jalynn Waddell: Rutherford Guys Treating Sakeenah Valcarcel/Extender: Yaakov Guthrie in Treatment: 19 Wound Status Wound Number: 9 Primary Venous Leg Ulcer Etiology: Wound Location: Right, Posterior Lower Leg Wound Status: Open Wounding Event:  Gradually Appeared Comorbid Cataracts, Arrhythmia, Hypertension, Type II Diabetes, Date Acquired: 06/01/2021 History: Osteoarthritis, Neuropathy Weeks Of Treatment: 10 Clustered Wound: No Photos Wound Measurements Length: (cm) 7 Width: (cm) 1 Depth: (cm) 0.1 Area: (cm) 5.498 Volume: (cm) 0.55 % Reduction in Area: -112.1% % Reduction in Volume: -112.4% Epithelialization: None Tunneling: No Undermining: No Wound Description Classification: Full Thickness Without Exposed Support Structures Exudate Amount: Medium Exudate Type: Serous Exudate Color: amber Foul Odor After Cleansing: No Slough/Fibrino Yes Wound Bed Granulation Amount: Small (1-33%) Exposed Structure Granulation Quality: Red, Pink Fascia Exposed: No Necrotic Amount: Large (67-100%) Fat Layer (Subcutaneous Tissue) Exposed: Yes Necrotic Quality: Adherent Slough Tendon Exposed: No Muscle Exposed: No Joint Exposed: No Bone Exposed: No Treatment Notes Wound #9 (Lower Leg) Wound Laterality: Right, Posterior Cleanser Wound Cleanser Discharge Instruction: Wash your hands with soap and water. Remove old dressing, discard into plastic bag and place into trash. Cleanse the wound with Wound Cleanser prior to applying a clean dressing using gauze sponges, not tissues or cotton balls. Do not scrub or use excessive force. Pat dry using gauze sponges, not tissue or cotton balls. [name], [name] ([id number]) Peri-Wound Care Topical Primary Dressing Hydrofera Blue Ready Transfer Foam, 4x5 (in/in) Discharge Instruction: Apply Hydrofera Blue Ready to wound bed as directed Secondary Dressing Zetuvit Plus Silicone Non-bordered 5x5 (in/in) Discharge Instruction: OR ALTERNATIVE ABSORBANT PAD Secured With Coban Cohesive Bandage 4x5 (yds) Stretched Discharge Instruction: Apply Coban as directed. Kerlix Roll Sterile or Non-Sterile 6-ply 4.5x4 (yd/yd) Discharge Instruction: Apply Kerlix as directed Compression Wrap Compression  Stockings Add-Ons Electronic Signature(s) Signed: 08/10/2021 5:33:03 PM By: Levora Dredge Entered By: Levora Dredge on 08/10/2021 15:36:03 [name] ([id number]) -------------------------------------------------------------------------------- Vitals Details Patient Name: [name]. Date of Service: 08/10/2021 3:00 PM Medical Record Number: [id number] Patient Account Number: [id number] Date of Birth/Sex: Apr 30, 1951 (71 y.o. F) Treating RN: Levora Dredge Primary Care Aya Geisel: Rutherford Guys Other Clinician: Referring Landers Prajapati: Rutherford Guys Treating Ariyan Sinnett/Extender: Yaakov Guthrie in Treatment: 19 Vital Signs Time Taken: 15:17 Temperature (F): 97.8 Height (in): 66 Pulse (bpm): 85 Weight (lbs): 332 Respiratory Rate (breaths/min): 20 Body Mass Index (BMI): 53.6 Blood Pressure (mmHg): 156/76 Reference Range: 80 - 120 mg / dl Electronic Signature(s) Signed: 08/10/2021 5:33:03 PM By: Levora Dredge Entered By: Levora Dredge on 08/10/2021 15:21:21

## 2021-08-10 NOTE — Progress Notes (Signed)
KUMARI, SCULLEY ([id number]) Visit Report for 08/10/2021 Chief Complaint Document Details Patient Name: [name], [name]. Date of Service: 08/10/2021 3:00 PM Medical Record Number: [id number] Patient Account Number: [id number] Date of Birth/Sex: 1950-07-04 (71 y.o. F) Treating RN: Levora Dredge Primary Care Provider: Rutherford Guys Other Clinician: Referring Provider: Rutherford Guys Treating Provider/Extender: Yaakov Guthrie in Treatment: 19 Information Obtained from: Patient Chief Complaint Bilateral lower extremity wounds Electronic Signature(s) Signed: 08/10/2021 4:03:58 PM By: Kalman Shan DO Entered By: Kalman Shan on 08/10/2021 16:01:21 [name] ([id number]) -------------------------------------------------------------------------------- HPI Details Patient Name: [name]. Date of Service: 08/10/2021 3:00 PM Medical Record Number: [id number] Patient Account Number: [id number] Date of Birth/Sex: 1951/04/06 (71 y.o. F) Treating RN: Levora Dredge Primary Care Provider: Rutherford Guys Other Clinician: Referring Provider: Rutherford Guys Treating Provider/Extender: Yaakov Guthrie in Treatment: 4 History of Present Illness HPI Description: Admission 10/5 [name] is a 71 year old female with a past medical history of hypertension, venous insufficiency, chronic diastolic heart failure and insulin-dependent type 2 diabetes that presents To the clinic for bilateral lower extremity wounds. She states that her right lower extremity developed a blister 1 week ago and she has been keeping the area covered with a bandaid. She states that when the Band-Aid came off it tore her skin. She subsequently developed pain, increased warmth and erythema to the right lower extremity. She denies purulent drainage. She has also developed an open wound to the left lower extremity that recently developed. She also has a small open wound to the proximal portion of the  left great toe. This has been present since August and was evaluated by podiatry. It has not healed and she has not followed up with podiatry for this issue. 10/12; patient presents for follow-up. She states she has 2 days left on her antibiotics. She is tolerated this well. She reports improvement to her right lower extremity symptoms. She denies signs of infection. She has home health. 10/26; patient presents for follow-up. She has no issues or complaints today. The compression wraps slid down to her shin. These were placed on Monday by home health. She has a couple of new open wounds due to this. She denies signs of infection. 11/2; patient presents for follow-up. The compression wraps had slid down to her shin again. The wraps taken off today do not appear to be appropriate material for 3 layer compression wrap. She currently denies signs of infection. 11/9; patient presents for follow-up. She was able to increase her diuretic over the past week. Again home health does not have appropriate materials for 3 layer compression. She currently denies signs of infection. 11/16; patient presents for follow-up. She has no issues or complaints today. She currently denies signs of infection. 11/30; patient presents for follow-up. The wrap continues to have a hard time staying in place. Overall she is doing well with no issues or complaints today. 12/7; patient presents for follow-up. She brought her juxta light compression today. She states however she cannot get these on daily. She has no issues or complaints today. 12/14; patient presents for follow-up. She states that the wrap rolled and created a new wound to her right lower extremity. Other than that she has no issues or complaints today. 12/21; patient presents for follow-up. The compression wraps continue to have a hard time staying in place. They again have rolled down her leg. 12/28; patient presents for follow-up. She has no issues or  complaints today. 1/11; patient presents for follow-up. She missed  her last clinic appointment due to transportation issues. She has no issues or complaints today. 1/18; patient presents for follow-up. She is been using her juxta light compression to the left lower extremity without issues. Home health has been changing the wrap on the right leg. She denies signs of infection today. 1/25; patient presents for follow-up. She is continuing to use her juxta light compression to the left lower extremity without issues. Home health has been changing the wrap on the right leg and she denies any issues to this area. She denies signs of infection. 2/9; patient presents for follow-up. She has no issues or complaints today. 2/15; patient presents for follow-up. She has no issues or complaints. She is in good spirits today. Electronic Signature(s) Signed: 08/10/2021 4:03:58 PM By: Kalman Shan DO Entered By: Kalman Shan on 08/10/2021 16:01:48 [name] ([id number]) -------------------------------------------------------------------------------- Physical Exam Details Patient Name: [name]. Date of Service: 08/10/2021 3:00 PM Medical Record Number: [id number] Patient Account Number: [id number] Date of Birth/Sex: 1951-02-07 (71 y.o. F) Treating RN: Levora Dredge Primary Care Provider: Rutherford Guys Other Clinician: Referring Provider: Rutherford Guys Treating Provider/Extender: Yaakov Guthrie in Treatment: 34 Constitutional . Cardiovascular . Psychiatric . Notes Patient has scattered wounds limited to skin breakdown to her right lower extremity. No signs of infection. Good edema control. Electronic Signature(s) Signed: 08/10/2021 4:03:58 PM By: Kalman Shan DO Entered By: Kalman Shan on 08/10/2021 16:02:08 [name] ([id number]) -------------------------------------------------------------------------------- Physician Orders Details Patient Name: [name]. Date of Service: 08/10/2021 3:00 PM Medical Record Number: [id number] Patient Account Number: [id number] Date of Birth/Sex: 1950-12-14 (71 y.o. F) Treating RN: Levora Dredge Primary Care Provider: Rutherford Guys Other Clinician: Referring Provider: Rutherford Guys Treating Provider/Extender: Yaakov Guthrie in Treatment: 31 Verbal / Phone Orders: No Diagnosis Coding Follow-up Appointments o Return Appointment in 1 week. o Nurse Visit as needed [place]: - [facility]-assist learning and application of Juxtelite velcro wrap Left leg o [place] for wound care. May utilize formulary equivalent dressing for wound treatment orders unless otherwise specified. Home Health Nurse may visit PRN to address patientos wound care needs. - SEE additional order for OT eval and treat for deconditioning, unable to don socks and needs to wear Vecro compression therapy o Scheduled days for dressing changes to be completed; exception, patient has scheduled wound care visit that day. o **Please direct any NON-WOUND related issues/requests for orders to patient's Primary Care Physician. **If current dressing causes regression in wound condition, may D/C ordered dressing product/s and apply Normal Saline Moist Dressing daily until next [place] or Other MD appointment. **Notify [facility] of regression in wound condition at [place] Bathing/ Shower/ Hygiene o May shower; gently cleanse wound with antibacterial soap, rinse and pat dry prior to dressing wounds o May shower with wound dressing protected with water repellent cover or cast protector. o No tub bath. Anesthetic (Use 'Patient Medications' Section for Anesthetic Order Entry) o Lidocaine applied to wound bed Edema Control - Lymphedema / Segmental Compressive Device / Other o Optional: One layer of unna paste to top of compression wrap  (to act as an anchor). - please use or compression wrap will not stay up-RIGHT LEG o Patient to wear own Velcro compression garment. Remove compression stockings every night before going to bed and put on every morning when getting up. - START JUXTELITE VELCRO WRAP TO LEFT LEG-assist with don and doff and education as  well as assistive device of sock donner o Elevate leg(s) parallel to the floor when sitting. o DO YOUR BEST to sleep in the bed at night. DO NOT sleep in your recliner. Long hours of sitting in a recliner leads to swelling of the legs and/or potential wounds on your backside. o Other: - Kerlix and Coban RIGHT LEG ONLY-wrap base of toes to behind knee-same as other leg bilateral wrap lower compression Non-Wound Condition o Additional non-wound orders/instructions: - Zinc oxide to any excoriated areas near drainage on right lower leg Additional Orders / Instructions o Follow Nutritious Diet and Increase Protein Intake o Other: - OT EVAL AND TREAT FOR DECONDITIONING-UNABLE TO DON SOCKS/needs to be able to don velcro wraps Medications-Please add to medication list. o Other: - follow with PCP about Lasix and Potassium tabs so that labwork may be done so meds can be adjusted Wound Treatment Wound #10 - Lower Leg Wound Laterality: Right, Anterior, Proximal Cleanser: Wound Cleanser 3 x Per Week/30 Days Discharge Instructions: Wash your hands with soap and water. Remove old dressing, discard into plastic bag and place into trash. Cleanse the wound with Wound Cleanser prior to applying a clean dressing using gauze sponges, not tissues or cotton balls. Do not scrub or use excessive force. Pat dry using gauze sponges, not tissue or cotton balls. Primary Dressing: Hydrofera Blue Ready Transfer Foam, 4x5 (in/in) 3 x Per Week/30 Days Discharge Instructions: Apply Hydrofera Blue Ready to wound bed as directed Secondary Dressing: ABD Pad 5x9 (in/in) 3 x Per Week/30  Days Discharge Instructions: 3-Cover with ABD pad Secured With: Coban Cohesive Bandage 4x5 (yds) Stretched 3 x Per Week/30 Days VICY, MEDICO ([id number]) Discharge Instructions: Apply Coban as directed. Secured With: [employer] or Non-Sterile 6-ply 4.5x4 (yd/yd) 3 x Per Week/30 Days Discharge Instructions: Apply Kerlix as directed Wound #11 - Lower Leg Wound Laterality: Right Cleanser: Wound Cleanser 3 x Per Week/30 Days Discharge Instructions: Wash your hands with soap and water. Remove old dressing, discard into plastic bag and place into trash. Cleanse the wound with Wound Cleanser prior to applying a clean dressing using gauze sponges, not tissues or cotton balls. Do not scrub or use excessive force. Pat dry using gauze sponges, not tissue or cotton balls. Primary Dressing: Hydrofera Blue Ready Transfer Foam, 4x5 (in/in) 3 x Per Week/30 Days Discharge Instructions: Apply Hydrofera Blue Ready to wound bed as directed Secondary Dressing: Zetuvit Plus Silicone Non-bordered 5x5 (in/in) 3 x Per Week/30 Days Discharge Instructions: OR ALTERNATIVE ABSORBANT PAD Secured With: Coban Cohesive Bandage 4x5 (yds) Stretched 3 x Per Week/30 Days Discharge Instructions: Apply Coban as directed. Secured With: [employer] or Non-Sterile 6-ply 4.5x4 (yd/yd) 3 x Per Week/30 Days Discharge Instructions: Apply Kerlix as directed Wound #9 - Lower Leg Wound Laterality: Right, Posterior Cleanser: Wound Cleanser 3 x Per Week/30 Days Discharge Instructions: Wash your hands with soap and water. Remove old dressing, discard into plastic bag and place into trash. Cleanse the wound with Wound Cleanser prior to applying a clean dressing using gauze sponges, not tissues or cotton balls. Do not scrub or use excessive force. Pat dry using gauze sponges, not tissue or cotton balls. Primary Dressing: Hydrofera Blue Ready Transfer Foam, 4x5 (in/in) 3 x Per Week/30 Days Discharge Instructions: Apply  Hydrofera Blue Ready to wound bed as directed Secondary Dressing: Zetuvit Plus Silicone Non-bordered 5x5 (in/in) 3 x Per Week/30 Days Discharge Instructions: OR ALTERNATIVE ABSORBANT PAD Secured With: Coban Cohesive Bandage 4x5 (yds) Stretched 3  x Per Week/30 Days Discharge Instructions: Apply Coban as directed. Secured With: [employer] or Non-Sterile 6-ply 4.5x4 (yd/yd) 3 x Per Week/30 Days Discharge Instructions: Apply Kerlix as directed Electronic Signature(s) Signed: 08/10/2021 4:03:58 PM By: Kalman Shan DO Entered By: Kalman Shan on 08/10/2021 16:03:30 [name] ([id number]) -------------------------------------------------------------------------------- Problem List Details Patient Name: [name]. Date of Service: 08/10/2021 3:00 PM Medical Record Number: [id number] Patient Account Number: [id number] Date of Birth/Sex: 09/13/1950 (71 y.o. F) Treating RN: Levora Dredge Primary Care Provider: Rutherford Guys Other Clinician: Referring Provider: Rutherford Guys Treating Provider/Extender: Yaakov Guthrie in Treatment: 19 Active Problems ICD-10 Encounter Code Description Active Date MDM Diagnosis L97.812 Non-pressure chronic ulcer of other part of right lower leg with fat layer 06/08/2021 No Yes exposed I87.331 Chronic venous hypertension (idiopathic) with ulcer and inflammation of 03/30/2021 No Yes right lower extremity I87.332 Chronic venous hypertension (idiopathic) with ulcer and inflammation of 03/30/2021 No Yes left lower extremity S91.302D Unspecified open wound, left foot, subsequent encounter 03/30/2021 No Yes Inactive Problems Resolved Problems Electronic Signature(s) Signed: 08/10/2021 4:03:58 PM By: Kalman Shan DO Entered By: Kalman Shan on 08/10/2021 16:01:15 [name] ([id number]) -------------------------------------------------------------------------------- Progress Note Details Patient Name: [name]. Date of Service: 08/10/2021 3:00 PM Medical Record Number: [id number] Patient Account Number: [id number] Date of Birth/Sex: [birth year] (71 y.o. F) Treating RN: Levora Dredge Primary Care Provider: Rutherford Guys Other Clinician: Referring Provider: Rutherford Guys Treating Provider/Extender: Yaakov Guthrie in Treatment: 19 Subjective Chief Complaint Information obtained from Patient Bilateral lower extremity wounds History of Present Illness (HPI) Admission 10/5 [name] is a 71 year old female with a past medical history of hypertension, venous insufficiency, chronic diastolic heart failure and insulin-dependent type 2 diabetes that presents To the clinic for bilateral lower extremity wounds. She states that her right lower extremity developed a blister 1 week ago and she has been keeping the area covered with a bandaid. She states that when the Band-Aid came off it tore her skin. She subsequently developed pain, increased warmth and erythema to the right lower extremity. She denies purulent drainage. She has also developed an open wound to the left lower extremity that recently developed. She also has a small open wound to the proximal portion of the left great toe. This has been present since August and was evaluated by podiatry. It has not healed and she has not followed up with podiatry for this issue. 10/12; patient presents for follow-up. She states she has 2 days left on her antibiotics. She is tolerated this well. She reports improvement to her right lower extremity symptoms. She denies signs of infection. She has home health. 10/26; patient presents for follow-up. She has no issues or complaints today. The compression wraps slid down to her shin. These were placed on Monday by home health. She has a couple of new open wounds due to this. She denies signs of infection. 11/2; patient presents for follow-up. The compression wraps had slid down to her shin again. The  wraps taken off today do not appear to be appropriate material for 3 layer compression wrap. She currently denies signs of infection. 11/9; patient presents for follow-up. She was able to increase her diuretic over the past week. Again home health does not have appropriate materials for 3 layer compression. She currently denies signs of infection. 11/16; patient presents for follow-up. She has no issues or complaints today. She currently denies signs of infection. 11/30; patient presents for follow-up. The wrap continues to  have a hard time staying in place. Overall she is doing well with no issues or complaints today. 12/7; patient presents for follow-up. She brought her juxta light compression today. She states however she cannot get these on daily. She has no issues or complaints today. 12/14; patient presents for follow-up. She states that the wrap rolled and created a new wound to her right lower extremity. Other than that she has no issues or complaints today. 12/21; patient presents for follow-up. The compression wraps continue to have a hard time staying in place. They again have rolled down her leg. 12/28; patient presents for follow-up. She has no issues or complaints today. 1/11; patient presents for follow-up. She missed her last clinic appointment due to transportation issues. She has no issues or complaints today. 1/18; patient presents for follow-up. She is been using her juxta light compression to the left lower extremity without issues. Home health has been changing the wrap on the right leg. She denies signs of infection today. 1/25; patient presents for follow-up. She is continuing to use her juxta light compression to the left lower extremity without issues. Home health has been changing the wrap on the right leg and she denies any issues to this area. She denies signs of infection. 2/9; patient presents for follow-up. She has no issues or complaints today. 2/15; patient  presents for follow-up. She has no issues or complaints. She is in good spirits today. Objective [name], [name] ([id number]) Constitutional Vitals Time Taken: 3:17 PM, Height: 66 in, Weight: 332 lbs, BMI: 53.6, Temperature: 97.8 F, Pulse: 85 bpm, Respiratory Rate: 20 breaths/min, Blood Pressure: 156/76 mmHg. General Notes: Patient has scattered wounds limited to skin breakdown to her right lower extremity. No signs of infection. Good edema control. Integumentary (Hair, Skin) Wound #10 status is Open. Original cause of wound was Gradually Appeared. The date acquired was: 06/08/2021. The wound has been in treatment 9 weeks. The wound is located on the Right,Proximal,Anterior Lower Leg. The wound measures 3cm length x 4cm width x 0.1cm depth; 9.425cm^2 area and 0.942cm^3 volume. There is Fat Layer (Subcutaneous Tissue) exposed. There is no tunneling or undermining noted. There is a medium amount of serosanguineous drainage noted. There is large (67-100%) red, pink granulation within the wound bed. There is a small (1-33%) amount of necrotic tissue within the wound bed including Adherent Slough. Wound #11 status is Open. Original cause of wound was Gradually Appeared. The date acquired was: 04/18/2021. The wound has been in treatment 8 weeks. The wound is located on the Right Lower Leg. The wound measures 0.9cm length x 0.6cm width x 0.1cm depth; 0.424cm^2 area and 0.042cm^3 volume. There is Fat Layer (Subcutaneous Tissue) exposed. There is no tunneling or undermining noted. There is a large amount of serosanguineous drainage noted. There is large (67-100%) red granulation within the wound bed. There is a small (1-33%) amount of necrotic tissue within the wound bed including Adherent Slough. Wound #9 status is Open. Original cause of wound was Gradually Appeared. The date acquired was: 06/01/2021. The wound has been in treatment 10 weeks. The wound is located on the Right,Posterior Lower Leg. The  wound measures 7cm length x 1cm width x 0.1cm depth; 5.498cm^2 area and 0.55cm^3 volume. There is Fat Layer (Subcutaneous Tissue) exposed. There is no tunneling or undermining noted. There is a medium amount of serous drainage noted. There is small (1-33%) red, pink granulation within the wound bed. There is a large (67-100%) amount of necrotic tissue within the  wound bed including Adherent Slough. 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 No signs of infection on exam. I recommended continue with Hydrofera Blue and Kerlix/Coban. Follow-up in 1 week. Plan Follow-up Appointments: Return Appointment in 1 week. Nurse Visit as needed Home Health: [place]: - [facility]-assist learning and application of Juxtelite velcro wrap Left leg [place] for wound care. May utilize formulary equivalent dressing for wound treatment orders unless otherwise specified. Home Health Nurse may visit PRN to address patient s wound care needs. - SEE additional order for OT eval and treat for deconditioning, unable to don socks and needs to wear Vecro compression therapy Scheduled days for dressing changes to be completed; exception, patient has scheduled wound care visit that day. **Please direct any NON-WOUND related issues/requests for orders to patient's Primary Care Physician. **If current dressing causes regression in wound condition, may D/C ordered dressing product/s and apply Normal Saline Moist Dressing daily until next [employer] or Other MD appointment. **Notify [facility] of regression in wound condition at [place] Bathing/ Shower/ Hygiene: May shower; gently cleanse wound with antibacterial soap, rinse and pat dry prior to dressing wounds May shower with wound dressing protected with water repellent cover or cast protector. No tub bath. Anesthetic (Use 'Patient Medications' Section for Anesthetic Order Entry): Lidocaine applied to wound bed Edema Control - Lymphedema / Segmental Compressive Device / Other: Optional: One layer of unna paste to top of compression wrap (to act as an anchor). - please use or compression wrap will not stay up-RIGHT LEG Patient to wear own Velcro compression garment. Remove compression stockings every night before going to bed and put on every morning when getting up. - START JUXTELITE VELCRO WRAP TO LEFT LEG-assist with don and doff and education as well as assistive device of sock donner Elevate leg(s) parallel to the floor when sitting. DO YOUR BEST to sleep in the bed at night. DO NOT sleep in your recliner. Long hours of sitting in a recliner leads to swelling of the legs and/or potential wounds on your backside. [name], [name] ([id number]) Other: - Kerlix and Coban RIGHT LEG ONLY-wrap base of toes to behind knee-same as other leg bilateral wrap lower compression Non-Wound Condition: Additional non-wound orders/instructions: - Zinc oxide to any excoriated areas near drainage on right lower leg Additional Orders / Instructions: Follow Nutritious Diet and Increase Protein Intake Other: - OT EVAL AND TREAT FOR DECONDITIONING-UNABLE TO DON SOCKS/needs to be able to don velcro wraps Medications-Please add to medication list.: Other: - follow with PCP about Lasix and Potassium tabs so that labwork may be done so meds can be adjusted WOUND #10: - Lower Leg Wound Laterality: Right, Anterior, Proximal Cleanser: Wound Cleanser 3 x Per Week/30 Days Discharge Instructions: Wash your hands with soap and water. Remove old dressing, discard into plastic bag  and place into trash. Cleanse the wound with Wound Cleanser prior to applying a clean dressing using gauze sponges, not tissues or cotton balls. Do not scrub or use excessive force. Pat dry using gauze sponges, not tissue or cotton balls. Primary Dressing: Hydrofera Blue Ready Transfer Foam, 4x5 (in/in) 3 x Per Week/30 Days Discharge Instructions: Apply [facility]  Ready to wound bed as directed Secondary Dressing: ABD Pad 5x9 (in/in) 3 x Per Week/30 Days Discharge Instructions: 3-Cover with ABD pad Secured With: Coban Cohesive Bandage 4x5 (yds) Stretched 3 x Per Week/30 Days Discharge Instructions: Apply Coban as directed. Secured With: [employer] or Non-Sterile 6-ply 4.5x4 (yd/yd) 3 x Per Week/30 Days Discharge Instructions: Apply Kerlix as directed WOUND #11: - Lower Leg Wound Laterality: Right Cleanser: Wound Cleanser 3 x Per Week/30 Days Discharge Instructions: Wash your hands with soap and water. Remove old dressing, discard into plastic bag and place into trash. Cleanse the wound with Wound Cleanser prior to applying a clean dressing using gauze sponges, not tissues or cotton balls. Do not scrub or use excessive force. Pat dry using gauze sponges, not tissue or cotton balls. Primary Dressing: Hydrofera Blue Ready Transfer Foam, 4x5 (in/in) 3 x Per Week/30 Days Discharge Instructions: Apply Hydrofera Blue Ready to wound bed as directed Secondary Dressing: Zetuvit Plus Silicone Non-bordered 5x5 (in/in) 3 x Per Week/30 Days Discharge Instructions: OR ALTERNATIVE ABSORBANT PAD Secured With: Coban Cohesive Bandage 4x5 (yds) Stretched 3 x Per Week/30 Days Discharge Instructions: Apply Coban as directed. Secured With: [employer] or Non-Sterile 6-ply 4.5x4 (yd/yd) 3 x Per Week/30 Days Discharge Instructions: Apply Kerlix as directed WOUND #9: - Lower Leg Wound Laterality: Right, Posterior Cleanser: Wound Cleanser 3 x Per Week/30 Days Discharge Instructions: Wash your  hands with soap and water. Remove old dressing, discard into plastic bag and place into trash. Cleanse the wound with Wound Cleanser prior to applying a clean dressing using gauze sponges, not tissues or cotton balls. Do not scrub or use excessive force. Pat dry using gauze sponges, not tissue or cotton balls. Primary Dressing: Hydrofera Blue Ready Transfer Foam, 4x5 (in/in) 3 x Per Week/30 Days Discharge Instructions: Apply Hydrofera Blue Ready to wound bed as directed Secondary Dressing: Zetuvit Plus Silicone Non-bordered 5x5 (in/in) 3 x Per Week/30 Days Discharge Instructions: OR ALTERNATIVE ABSORBANT PAD Secured With: Coban Cohesive Bandage 4x5 (yds) Stretched 3 x Per Week/30 Days Discharge Instructions: Apply Coban as directed. Secured With: [employer] or Non-Sterile 6-ply 4.5x4 (yd/yd) 3 x Per Week/30 Days Discharge Instructions: Apply Kerlix as directed 1. Hydrofera Blue under Kerlix/Coban 2. Follow-up in 1 week Electronic Signature(s) Signed: 08/10/2021 4:03:58 PM By: Kalman Shan DO Entered By: Kalman Shan on 08/10/2021 16:02:50 [name] ([id number]) -------------------------------------------------------------------------------- SuperBill Details Patient Name: [name]. Date of Service: 08/10/2021 Medical Record Number: [id number] Patient Account Number: [id number] Date of Birth/Sex: 06-Feb-1951 (71 y.o. F) Treating RN: Levora Dredge Primary Care Provider: Rutherford Guys Other Clinician: Referring Provider: Rutherford Guys Treating Provider/Extender: Yaakov Guthrie in Treatment: 19 Diagnosis Coding ICD-10 Codes Code Description [phone] Non-pressure chronic ulcer of other part of right lower leg with fat layer exposed I87.331 Chronic venous hypertension (idiopathic) with ulcer and inflammation of right lower extremity I87.332 Chronic venous hypertension (idiopathic) with ulcer and inflammation of left lower extremity S91.302D Unspecified  open wound, left foot, subsequent encounter Facility Procedures CPT4 Code: [id number] Description: 99213 - WOUND CARE VISIT-LEV 3 EST PT Modifier: Quantity: 1 Physician Procedures CPT4 Code Description: 1937902 99213 - WC PHYS LEVEL 3 - EST PT Modifier: Quantity: 1 CPT4 Code Description: ICD-10 Diagnosis Description I09.735 Non-pressure chronic ulcer of other part of right lower leg with fat lay I87.331 Chronic venous hypertension (idiopathic) with ulcer and inflammation of I87.332 Chronic venous hypertension  (idiopathic) with ulcer and inflammation of S91.302D Unspecified open wound, left foot, subsequent encounter  Modifier: er exposed right lower extremit left lower extremity Quantity: y Engineer, maintenance) Signed: 08/10/2021 5:21:54 PM By: Levora Dredge Previous Signature: 08/10/2021 4:03:58 PM Version By: Kalman Shan DO Entered By: Levora Dredge on 08/10/2021 17:21:54

## 2021-08-17 ENCOUNTER — Other Ambulatory Visit: Payer: Self-pay

## 2021-08-17 ENCOUNTER — Encounter (HOSPITAL_BASED_OUTPATIENT_CLINIC_OR_DEPARTMENT_OTHER): Payer: Medicare Other | Admitting: Internal Medicine

## 2021-08-17 DIAGNOSIS — L97812 Non-pressure chronic ulcer of other part of right lower leg with fat layer exposed: Secondary | ICD-10-CM | POA: Diagnosis not present

## 2021-08-17 DIAGNOSIS — I87331 Chronic venous hypertension (idiopathic) with ulcer and inflammation of right lower extremity: Secondary | ICD-10-CM | POA: Diagnosis not present

## 2021-08-17 NOTE — Progress Notes (Signed)
MARGUITA, VENNING ([id number]) Visit Report for 08/17/2021 Chief Complaint Document Details Patient Name: [name], [name]. Date of Service: 08/17/2021 3:00 PM Medical Record Number: [id number] Patient Account Number: [id number] Date of Birth/Sex: 1950-10-12 (71 y.o. F) Treating RN: Levora Dredge Primary Care Provider: Rutherford Guys Other Clinician: Referring Provider: Rutherford Guys Treating Provider/Extender: Yaakov Guthrie in Treatment: 20 Information Obtained from: Patient Chief Complaint Bilateral lower extremity wounds Electronic Signature(s) Signed: 08/17/2021 3:58:09 PM By: Kalman Shan DO Entered By: Kalman Shan on 08/17/2021 15:52:13 [name] ([id number]) -------------------------------------------------------------------------------- Debridement Details Patient Name: [name]. Date of Service: 08/17/2021 3:00 PM Medical Record Number: [id number] Patient Account Number: [id number] Date of Birth/Sex: 29-Dec-1950 (71 y.o. F) Treating RN: Levora Dredge Primary Care Provider: Rutherford Guys Other Clinician: Referring Provider: Rutherford Guys Treating Provider/Extender: Yaakov Guthrie in Treatment: 20 Debridement Performed for Wound #11 Right Lower Leg Assessment: Performed By: Physician Kalman Shan, MD Debridement Type: Debridement Severity of Tissue Pre Debridement: Fat layer exposed Level of Consciousness (Pre- Awake and Alert procedure): Pre-procedure Verification/Time Out Yes - 15:46 Taken: Total Area Debrided (L x W): 1 (cm) x 0.5 (cm) = 0.5 (cm) Tissue and other material Viable, Non-Viable, Slough, Subcutaneous, Slough debrided: Level: Skin/Subcutaneous Tissue Debridement Description: Excisional Instrument: Curette Bleeding: Minimum Hemostasis Achieved: Pressure Response to Treatment: Procedure was tolerated well Level of Consciousness (Post- Awake and Alert procedure): Post Debridement Measurements of Total  Wound Length: (cm) 1 Width: (cm) 0.5 Depth: (cm) 0.1 Volume: (cm) 0.039 Character of Wound/Ulcer Post Debridement: Stable Severity of Tissue Post Debridement: Fat layer exposed Post Procedure Diagnosis Same as Pre-procedure Electronic Signature(s) Signed: 08/17/2021 3:58:09 PM By: Kalman Shan DO Signed: 08/17/2021 4:28:37 PM By: Levora Dredge Entered By: Levora Dredge on 08/17/2021 15:48:14 [name] ([id number]) -------------------------------------------------------------------------------- HPI Details Patient Name: [name]. Date of Service: 08/17/2021 3:00 PM Medical Record Number: [id number] Patient Account Number: [id number] Date of Birth/Sex: 08-May-1951 (70 y.o. F) Treating RN: Levora Dredge Primary Care Provider: Rutherford Guys Other Clinician: Referring Provider: Rutherford Guys Treating Provider/Extender: Yaakov Guthrie in Treatment: 20 History of Present Illness HPI Description: Admission 10/5 [name] is a 71 year old female with a past medical history of hypertension, venous insufficiency, chronic diastolic heart failure and insulin-dependent type 2 diabetes that presents To the clinic for bilateral lower extremity wounds. She states that her right lower extremity developed a blister 1 week ago and she has been keeping the area covered with a bandaid. She states that when the Band-Aid came off it tore her skin. She subsequently developed pain, increased warmth and erythema to the right lower extremity. She denies purulent drainage. She has also developed an open wound to the left lower extremity that recently developed. She also has a small open wound to the proximal portion of the left great toe. This has been present since August and was evaluated by podiatry. It has not healed and she has not followed up with podiatry for this issue. 10/12; patient presents for follow-up. She states she has 2 days left on her antibiotics. She is  tolerated this well. She reports improvement to her right lower extremity symptoms. She denies signs of infection. She has home health. 10/26; patient presents for follow-up. She has no issues or complaints today. The compression wraps slid down to her shin. These were placed on Monday by home health. She has a couple of new open wounds due to this. She denies signs of infection. 11/2; patient presents for follow-up. The compression wraps  had slid down to her shin again. The wraps taken off today do not appear to be appropriate material for 3 layer compression wrap. She currently denies signs of infection. 11/9; patient presents for follow-up. She was able to increase her diuretic over the past week. Again home health does not have appropriate materials for 3 layer compression. She currently denies signs of infection. 11/16; patient presents for follow-up. She has no issues or complaints today. She currently denies signs of infection. 11/30; patient presents for follow-up. The wrap continues to have a hard time staying in place. Overall she is doing well with no issues or complaints today. 12/7; patient presents for follow-up. She brought her juxta light compression today. She states however she cannot get these on daily. She has no issues or complaints today. 12/14; patient presents for follow-up. She states that the wrap rolled and created a new wound to her right lower extremity. Other than that she has no issues or complaints today. 12/21; patient presents for follow-up. The compression wraps continue to have a hard time staying in place. They again have rolled down her leg. 12/28; patient presents for follow-up. She has no issues or complaints today. 1/11; patient presents for follow-up. She missed her last clinic appointment due to transportation issues. She has no issues or complaints today. 1/18; patient presents for follow-up. She is been using her juxta light compression to the left lower  extremity without issues. Home health has been changing the wrap on the right leg. She denies signs of infection today. 1/25; patient presents for follow-up. She is continuing to use her juxta light compression to the left lower extremity without issues. Home health has been changing the wrap on the right leg and she denies any issues to this area. She denies signs of infection. 2/9; patient presents for follow-up. She has no issues or complaints today. 2/15; patient presents for follow-up. She has no issues or complaints. She is in good spirits today. 2/22; patient presents for follow-up. She has no issues or complaints today. Electronic Signature(s) Signed: 08/17/2021 3:58:09 PM By: Kalman Shan DO Entered By: Kalman Shan on 08/17/2021 15:52:30 [name] ([id number]) -------------------------------------------------------------------------------- Physical Exam Details Patient Name: [name]. Date of Service: 08/17/2021 3:00 PM Medical Record Number: [id number] Patient Account Number: [id number] Date of Birth/Sex: 01/25/1951 (71 y.o. F) Treating RN: Levora Dredge Primary Care Provider: Rutherford Guys Other Clinician: Referring Provider: Rutherford Guys Treating Provider/Extender: Yaakov Guthrie in Treatment: 20 Constitutional . Cardiovascular . Psychiatric . Notes Patient has 1 wound remaining to the distal lateral aspect of the right lower extremity. This is granulation tissue and slough. No signs of surrounding infection. Decent edema control. Electronic Signature(s) Signed: 08/17/2021 3:58:09 PM By: Kalman Shan DO Entered By: Kalman Shan on 08/17/2021 15:53:44 [name] ([id number]) -------------------------------------------------------------------------------- Physician Orders Details Patient Name: [name]. Date of Service: 08/17/2021 3:00 PM Medical Record Number: [id number] Patient Account Number: [id number] Date of  Birth/Sex: 1951/04/23 (71 y.o. F) Treating RN: Levora Dredge Primary Care Provider: Rutherford Guys Other Clinician: Referring Provider: Rutherford Guys Treating Provider/Extender: Yaakov Guthrie in Treatment: 20 Verbal / Phone Orders: No Diagnosis Coding ICD-10 Coding Code Description [id number] Non-pressure chronic ulcer of other part of right lower leg with fat layer exposed I87.331 Chronic venous hypertension (idiopathic) with ulcer and inflammation of right lower extremity I87.332 Chronic venous hypertension (idiopathic) with ulcer and inflammation of left lower extremity S91.302D Unspecified open wound, left foot, subsequent encounter Follow-up Appointments o Return Appointment  in 1 week. o Nurse Visit as needed [place]: - [facility]-assist learning and application of Juxtelite velcro wrap Left leg o [place] for wound care. May utilize formulary equivalent dressing for wound treatment orders unless otherwise specified. Home Health Nurse may visit PRN to address patientos wound care needs. - SEE additional order for OT eval and treat for deconditioning, unable to don socks and needs to wear Vecro compression therapy o Scheduled days for dressing changes to be completed; exception, patient has scheduled wound care visit that day. o **Please direct any NON-WOUND related issues/requests for orders to patient's Primary Care Physician. **If current dressing causes regression in wound condition, may D/C ordered dressing product/s and apply Normal Saline Moist Dressing daily until next [place] or Other MD appointment. **Notify [facility] of regression in wound condition at [place] Orders/Instructions: - please place hydrofera blue of any open areas noticed on RLE Bathing/ Shower/ Hygiene o May shower; gently cleanse wound with antibacterial soap, rinse and pat dry  prior to dressing wounds o May shower with wound dressing protected with water repellent cover or cast protector. o No tub bath. Anesthetic (Use 'Patient Medications' Section for Anesthetic Order Entry) o Lidocaine applied to wound bed Edema Control - Lymphedema / Segmental Compressive Device / Other o Optional: One layer of unna paste to top of compression wrap (to act as an anchor). - please use or compression wrap will not stay up-RIGHT LEG o Patient to wear own Velcro compression garment. Remove compression stockings every night before going to bed and put on every morning when getting up. - START JUXTELITE VELCRO WRAP TO LEFT LEG-assist with don and doff and education as well as assistive device of sock donner o Elevate leg(s) parallel to the floor when sitting. o DO YOUR BEST to sleep in the bed at night. DO NOT sleep in your recliner. Long hours of sitting in a recliner leads to swelling of the legs and/or potential wounds on your backside. o Other: - Kerlix and Coban RIGHT LEG ONLY-wrap base of toes to behind knee-same as other leg bilateral wrap lower compression Non-Wound Condition o Additional non-wound orders/instructions: - Zinc oxide to any excoriated areas near drainage on right lower leg Additional Orders / Instructions o Follow Nutritious Diet and Increase Protein Intake o Other: - OT EVAL AND TREAT FOR DECONDITIONING-UNABLE TO DON SOCKS/needs to be able to don velcro wraps Medications-Please add to medication list. o Other: - follow with PCP about Lasix and Potassium tabs so that labwork may be done so meds can be adjusted Wound Treatment Wound #11 - Lower Leg Wound Laterality: Right Cleanser: Wound Cleanser 3 x Per Week/30 Days DANIEL, JOHNDROW ([id number]) Discharge Instructions: Wash your hands with soap and water. Remove old dressing, discard into plastic bag and place into trash. Cleanse the wound with Wound Cleanser prior to applying a clean  dressing using gauze sponges, not tissues or cotton balls. Do not scrub or use excessive force. Pat dry using gauze sponges, not tissue or cotton balls. Topical: Gentamicin 3 x Per Week/30 Days Discharge Instructions: Apply as directed by provider. in office only Topical: Santyl Collagenase Ointment, 30 (gm), tube 3 x Per Week/30 Days Discharge Instructions: apply nickel thick to wound bed only. IN office only Primary Dressing: Hydrofera Blue Ready Transfer Foam, 4x5 (in/in) 3 x Per Week/30 Days Discharge Instructions: Apply Hydrofera Blue Ready to wound bed as directed  Secondary Dressing: ABD Pad 5x9 (in/in) 3 x Per Week/30 Days Discharge Instructions: Cover with ABD pad cut in half length wise to place on shin prior to placing unna boot to protect skin when removing Secondary Dressing: Zetuvit Plus Silicone Non-bordered 5x5 (in/in) 3 x Per Week/30 Days Discharge Instructions: OR ALTERNATIVE ABSORBANT PAD Compression Wrap: Unna Boot 4x10 (in/yd) 3 x Per Week/30 Days Discharge Instructions: Unna Paste, Kerlix and Coban from base of toes to three finger widths below bend in knee. Electronic Signature(s) Signed: 08/17/2021 4:21:22 PM By: Kalman Shan DO Signed: 08/17/2021 4:28:37 PM By: Levora Dredge Previous Signature: 08/17/2021 3:58:09 PM Version By: Kalman Shan DO Entered By: Levora Dredge on 08/17/2021 16:11:45 [name] ([id number]) -------------------------------------------------------------------------------- Problem List Details Patient Name: VONA, WHITERS. Date of Service: 08/17/2021 3:00 PM Medical Record Number: [id number] Patient Account Number: [id number] Date of Birth/Sex: 09-29-1950 (71 y.o. F) Treating RN: Levora Dredge Primary Care Provider: Rutherford Guys Other Clinician: Referring Provider: Rutherford Guys Treating Provider/Extender: Yaakov Guthrie in Treatment: 20 Active Problems ICD-10 Encounter Code Description Active Date  MDM Diagnosis L97.812 Non-pressure chronic ulcer of other part of right lower leg with fat layer 06/08/2021 No Yes exposed I87.331 Chronic venous hypertension (idiopathic) with ulcer and inflammation of 03/30/2021 No Yes right lower extremity I87.332 Chronic venous hypertension (idiopathic) with ulcer and inflammation of 03/30/2021 No Yes left lower extremity S91.302D Unspecified open wound, left foot, subsequent encounter 03/30/2021 No Yes Inactive Problems Resolved Problems Electronic Signature(s) Signed: 08/17/2021 3:58:09 PM By: Kalman Shan DO Entered By: Kalman Shan on 08/17/2021 15:52:08 [name] ([id number]) -------------------------------------------------------------------------------- Progress Note Details Patient Name: [name]. Date of Service: 08/17/2021 3:00 PM Medical Record Number: [id number] Patient Account Number: [id number] Date of Birth/Sex: 26-Apr-1951 (71 y.o. F) Treating RN: Levora Dredge Primary Care Provider: Rutherford Guys Other Clinician: Referring Provider: Rutherford Guys Treating Provider/Extender: Yaakov Guthrie in Treatment: 20 Subjective Chief Complaint Information obtained from Patient Bilateral lower extremity wounds History of Present Illness (HPI) Admission 10/5 Ms. Tyrica Afzal is a 70 year old female with a past medical history of hypertension, venous insufficiency, chronic diastolic heart failure and insulin-dependent type 2 diabetes that presents To the clinic for bilateral lower extremity wounds. She states that her right lower extremity developed a blister 1 week ago and she has been keeping the area covered with a bandaid. She states that when the Band-Aid came off it tore her skin. She subsequently developed pain, increased warmth and erythema to the right lower extremity. She denies purulent drainage. She has also developed an open wound to the left lower extremity that recently developed. She also has a small  open wound to the proximal portion of the left great toe. This has been present since August and was evaluated by podiatry. It has not healed and she has not followed up with podiatry for this issue. 10/12; patient presents for follow-up. She states she has 2 days left on her antibiotics. She is tolerated this well. She reports improvement to her right lower extremity symptoms. She denies signs of infection. She has home health. 10/26; patient presents for follow-up. She has no issues or complaints today. The compression wraps slid down to her shin. These were placed on Monday by home health. She has a couple of new open wounds due to this. She denies signs of infection. 11/2; patient presents for follow-up. The compression wraps had slid down to her shin again. The wraps taken off today do not appear to be appropriate material for  3 layer compression wrap. She currently denies signs of infection. 11/9; patient presents for follow-up. She was able to increase her diuretic over the past week. Again home health does not have appropriate materials for 3 layer compression. She currently denies signs of infection. 11/16; patient presents for follow-up. She has no issues or complaints today. She currently denies signs of infection. 11/30; patient presents for follow-up. The wrap continues to have a hard time staying in place. Overall she is doing well with no issues or complaints today. 12/7; patient presents for follow-up. She brought her juxta light compression today. She states however she cannot get these on daily. She has no issues or complaints today. 12/14; patient presents for follow-up. She states that the wrap rolled and created a new wound to her right lower extremity. Other than that she has no issues or complaints today. 12/21; patient presents for follow-up. The compression wraps continue to have a hard time staying in place. They again have rolled down her leg. 12/28; patient presents for  follow-up. She has no issues or complaints today. 1/11; patient presents for follow-up. She missed her last clinic appointment due to transportation issues. She has no issues or complaints today. 1/18; patient presents for follow-up. She is been using her juxta light compression to the left lower extremity without issues. Home health has been changing the wrap on the right leg. She denies signs of infection today. 1/25; patient presents for follow-up. She is continuing to use her juxta light compression to the left lower extremity without issues. Home health has been changing the wrap on the right leg and she denies any issues to this area. She denies signs of infection. 2/9; patient presents for follow-up. She has no issues or complaints today. 2/15; patient presents for follow-up. She has no issues or complaints. She is in good spirits today. 2/22; patient presents for follow-up. She has no issues or complaints today. Objective GILIANA, VANTIL ([id number]) Constitutional Vitals Time Taken: 3:24 PM, Height: 66 in, Weight: 332 lbs, BMI: 53.6, Temperature: 97.8 F, Pulse: 91 bpm, Respiratory Rate: 20 breaths/min, Blood Pressure: 165/84 mmHg. General Notes: Patient has 1 wound remaining to the distal lateral aspect of the right lower extremity. This is granulation tissue and slough. No signs of surrounding infection. Decent edema control. Integumentary (Hair, Skin) Wound #10 status is Open. Original cause of wound was Gradually Appeared. The date acquired was: 06/08/2021. The wound has been in treatment 10 weeks. The wound is located on the Right,Proximal,Anterior Lower Leg. The wound measures 0.1cm length x 0.1cm width x 0.1cm depth; 0.008cm^2 area and 0.001cm^3 volume. There is no tunneling or undermining noted. There is a medium amount of serosanguineous drainage noted. There is no granulation within the wound bed. There is no necrotic tissue within the wound bed. Wound #11 status is Open.  Original cause of wound was Gradually Appeared. The date acquired was: 04/18/2021. The wound has been in treatment 9 weeks. The wound is located on the Right Lower Leg. The wound measures 1cm length x 0.5cm width x 0.1cm depth; 0.393cm^2 area and 0.039cm^3 volume. There is Fat Layer (Subcutaneous Tissue) exposed. There is no tunneling or undermining noted. There is a large amount of serosanguineous drainage noted. There is large (67-100%) red granulation within the wound bed. There is a small (1-33%) amount of necrotic tissue within the wound bed including Adherent Slough. Wound #9 status is Open. Original cause of wound was Gradually Appeared. The date acquired was: 06/01/2021. The wound has  been in treatment 11 weeks. The wound is located on the Right,Posterior Lower Leg. The wound measures 0.1cm length x 0.1cm width x 0.1cm depth; 0.008cm^2 area and 0.001cm^3 volume. There is Fat Layer (Subcutaneous Tissue) exposed. There is no tunneling or undermining noted. There is a none present amount of drainage noted. There is no granulation within the wound bed. There is no necrotic tissue within the wound bed. General Notes: dry skin noted Assessment Active Problems ICD-10 Non-pressure chronic ulcer of other part of right lower leg with fat layer exposed Chronic venous hypertension (idiopathic) with ulcer and inflammation of right lower extremity Chronic venous hypertension (idiopathic) with ulcer and inflammation of left lower extremity Unspecified open wound, left foot, subsequent encounter Patient has done well with Hydrofera Blue and Kerlix/Coban. She has had Unna boots in the past and reports the wrap staying up more easily. At this time I recommended switching to an Haematologist along with [employer]. I debrided nonviable tissue and recommended adding Santyl For further debridement and gentamicin to address any bioburden as well (in office only). Procedures Wound #11 Pre-procedure diagnosis of  Wound #11 is a Venous Leg Ulcer located on the Right Lower Leg .Severity of Tissue Pre Debridement is: Fat layer exposed. There was a Excisional Skin/Subcutaneous Tissue Debridement with a total area of 0.5 sq cm performed by Kalman Shan, MD. With the following instrument(s): Curette to remove Viable and Non-Viable tissue/material. Material removed includes Subcutaneous Tissue and Slough and. No specimens were taken. A time out was conducted at 15:46, prior to the start of the procedure. A Minimum amount of bleeding was controlled with Pressure. The procedure was tolerated well. Post Debridement Measurements: 1cm length x 0.5cm width x 0.1cm depth; 0.039cm^3 volume. Character of Wound/Ulcer Post Debridement is stable. Severity of Tissue Post Debridement is: Fat layer exposed. Post procedure Diagnosis Wound #11: Same as Pre-Procedure Plan 1. In office sharp debridement 2. Santyl, gentamicin and Hydrofera Blue under [employer] RAHCEL, SHUTES ([id number]) Electronic Signature(s) Signed: 08/17/2021 3:58:09 PM By: Kalman Shan DO Entered By: Kalman Shan on 08/17/2021 15:56:50 [name] ([id number]) -------------------------------------------------------------------------------- SuperBill Details Patient Name: [name]. Date of Service: 08/17/2021 Medical Record Number: [id number] Patient Account Number: [id number] Date of Birth/Sex: 1951-05-19 (71 y.o. F) Treating RN: Levora Dredge Primary Care Provider: Rutherford Guys Other Clinician: Referring Provider: Rutherford Guys Treating Provider/Extender: Yaakov Guthrie in Treatment: 20 Diagnosis Coding ICD-10 Codes Code Description [phone] Non-pressure chronic ulcer of other part of right lower leg with fat layer exposed I87.331 Chronic venous hypertension (idiopathic) with ulcer and inflammation of right lower extremity I87.332 Chronic venous hypertension (idiopathic) with ulcer and inflammation of left lower  extremity S91.302D Unspecified open wound, left foot, subsequent encounter Facility Procedures CPT4 Code Description: [id number] 11042 - DEB SUBQ TISSUE 20 SQ CM/< Modifier: Quantity: 1 CPT4 Code Description: ICD-10 Diagnosis Description J82.505 Non-pressure chronic ulcer of other part of right lower leg with fat laye I87.331 Chronic venous hypertension (idiopathic) with ulcer and inflammation of r Modifier: r exposed ight lower extremi Quantity: ty Physician Procedures CPT4 Code Description: [id number] 11042 - WC PHYS SUBQ TISS 20 SQ CM Modifier: Quantity: 1 CPT4 Code Description: ICD-10 Diagnosis Description L93.790 Non-pressure chronic ulcer of other part of right lower leg with fat laye I87.331 Chronic venous hypertension (idiopathic) with ulcer and inflammation of r Modifier: r exposed ight lower extremit Quantity: y Engineer, maintenance) Signed: 08/17/2021 4:21:22 PM By: Kalman Shan DO Signed: 08/17/2021 4:28:37 PM By: Levora Dredge Previous Signature:  08/17/2021 3:58:09 PM Version By: Kalman Shan DO Entered By: Levora Dredge on 08/17/2021 16:15:47

## 2021-08-17 NOTE — Progress Notes (Signed)
[name], [name] ([id number]) Visit Report for 08/17/2021 Arrival Information Details Patient Name: [name], [name]. Date of Service: 08/17/2021 3:00 PM Medical Record Number: [id number] Patient Account Number: [id number] Date of Birth/Sex: Jan 26, 1951 (71 y.o. F) Treating RN: Levora Dredge Primary Care Allanna Bresee: Rutherford Guys Other Clinician: Referring Keshanna Riso: Rutherford Guys Treating Emmilyn Crooke/Extender: Yaakov Guthrie in Treatment: 20 Visit Information History Since Last Visit Added or deleted any medications: No Patient Arrived: [name] Any new allergies or adverse reactions: No Arrival Time: 15:19 Had a fall or experienced change in No Accompanied By: self activities of daily living that may affect Transfer Assistance: None risk of falls: Patient Requires Transmission-Based No Hospitalized since last visit: No Precautions: Has Dressing in Place as Prescribed: Yes Patient Has Alerts: Yes Has Compression in Place as Prescribed: Yes Patient Alerts: Patient on Blood Pain Present Now: No Thinner DIABETIC ELIQUIS Electronic Signature(s) Signed: 08/17/2021 4:28:37 PM By: Levora Dredge Entered By: Levora Dredge on 08/17/2021 15:20:25 [name] ([id number]) -------------------------------------------------------------------------------- Clinic Level of Care Assessment Details Patient Name: [name]. Date of Service: 08/17/2021 3:00 PM Medical Record Number: [id number] Patient Account Number: [id number] Date of Birth/Sex: 15-Sep-1950 (71 y.o. F) Treating RN: Levora Dredge Primary Care Burtis Imhoff: Rutherford Guys Other Clinician: Referring Wilder Kurowski: Rutherford Guys Treating Naina Sleeper/Extender: Yaakov Guthrie in Treatment: 20 Clinic Level of Care Assessment Items TOOL 1 Quantity Score []  - Use when EandM and Procedure is performed on INITIAL visit 0 ASSESSMENTS - Nursing Assessment / Reassessment []  - General Physical Exam (combine w/ comprehensive  assessment (listed just below) when performed on new 0 pt. evals) []  - 0 Comprehensive Assessment (HX, ROS, Risk Assessments, Wounds Hx, etc.) ASSESSMENTS - Wound and Skin Assessment / Reassessment []  - Dermatologic / Skin Assessment (not related to wound area) 0 ASSESSMENTS - Ostomy and/or Continence Assessment and Care []  - Incontinence Assessment and Management 0 []  - 0 Ostomy Care Assessment and Management (repouching, etc.) PROCESS - Coordination of Care []  - Simple Patient / Family Education for ongoing care 0 []  - 0 Complex (extensive) Patient / Family Education for ongoing care []  - 0 Staff obtains Programmer, systems, Records, Test Results / Process Orders []  - 0 Staff telephones HHA, Nursing Homes / Clarify orders / etc []  - 0 Routine Transfer to another Facility (non-emergent condition) []  - 0 Routine Hospital Admission (non-emergent condition) []  - 0 New Admissions / Biomedical engineer / Ordering NPWT, Apligraf, etc. []  - 0 Emergency Hospital Admission (emergent condition) PROCESS - Special Needs []  - Pediatric / Minor Patient Management 0 []  - 0 Isolation Patient Management []  - 0 Hearing / Language / Visual special needs []  - 0 Assessment of Community assistance (transportation, D/C planning, etc.) []  - 0 Additional assistance / Altered mentation []  - 0 Support Surface(s) Assessment (bed, cushion, seat, etc.) INTERVENTIONS - Miscellaneous []  - External ear exam 0 []  - 0 Patient Transfer (multiple staff / Civil Service fast streamer / Similar devices) []  - 0 Simple Staple / Suture removal (25 or less) []  - 0 Complex Staple / Suture removal (26 or more) []  - 0 Hypo/Hyperglycemic Management (do not check if billed separately) []  - 0 Ankle / Brachial Index (ABI) - do not check if billed separately Has the patient been seen at the hospital within the last three years: Yes Total Score: 0 Level Of Care: ____ [name] ([id number]) Electronic Signature(s) Signed:  08/17/2021 4:28:37 PM By: Levora Dredge Entered By: Levora Dredge on 08/17/2021 16:15:35 [name] ([id number]) -------------------------------------------------------------------------------- Encounter Discharge Information  Details Patient Name: [name], [name]. Date of Service: 08/17/2021 3:00 PM Medical Record Number: [id number] Patient Account Number: [id number] Date of Birth/Sex: 28-Apr-1951 (71 y.o. F) Treating RN: Levora Dredge Primary Care Vastie Douty: Rutherford Guys Other Clinician: Referring Nikash Mortensen: Rutherford Guys Treating Kelly Ranieri/Extender: Yaakov Guthrie in Treatment: 20 Encounter Discharge Information Items Post Procedure Vitals Discharge Condition: Stable Temperature (F): 97.8 Ambulatory Status: Walker Pulse (bpm): 91 Discharge Destination: Home Respiratory Rate (breaths/min): 20 Transportation: Other Blood Pressure (mmHg): 165/84 Accompanied By: self Schedule Follow-up Appointment: Yes Clinical Summary of Care: Electronic Signature(s) Signed: 08/17/2021 4:28:37 PM By: Levora Dredge Entered By: Levora Dredge on 08/17/2021 16:16:57 [name] ([id number]) -------------------------------------------------------------------------------- Lower Extremity Assessment Details Patient Name: [name]. Date of Service: 08/17/2021 3:00 PM Medical Record Number: [id number] Patient Account Number: [id number] Date of Birth/Sex: 02-Jun-1951 (71 y.o. F) Treating RN: Levora Dredge Primary Care Dyer Klug: Rutherford Guys Other Clinician: Referring Keandria Berrocal: Rutherford Guys Treating Randell Teare/Extender: Yaakov Guthrie in Treatment: 20 Edema Assessment Assessed: [Left: No] [Right: No] Edema: [Left: Ye] [Right: s] Calf Left: Right: Point of Measurement: 26 cm From Medial Instep 42.5 cm Ankle Left: Right: Point of Measurement: 10 cm From Medial Instep 22.2 cm Vascular Assessment Pulses: Dorsalis Pedis Palpable: [Right:Yes] Posterior  Tibial Palpable: [Right:Yes] Electronic Signature(s) Signed: 08/17/2021 4:28:37 PM By: Levora Dredge Entered By: Levora Dredge on 08/17/2021 15:37:37 [name] ([id number]) -------------------------------------------------------------------------------- Multi Wound Chart Details Patient Name: [name]. Date of Service: 08/17/2021 3:00 PM Medical Record Number: [id number] Patient Account Number: [id number] Date of Birth/Sex: Jan 15, 1951 (71 y.o. F) Treating RN: Levora Dredge Primary Care Winifred Balogh: Rutherford Guys Other Clinician: Referring Chrissi Crow: Rutherford Guys Treating Cheryllynn Sarff/Extender: Yaakov Guthrie in Treatment: 20 Vital Signs Height(in): 70 Pulse(bpm): 74 Weight(lbs): 332 Blood Pressure(mmHg): 165/84 Body Mass Index(BMI): 53.6 Temperature(F): 97.8 Respiratory Rate(breaths/min): 20 Photos: Wound Location: Right, Proximal, Anterior Lower Leg Right Lower Leg Right, Posterior Lower Leg Wounding Event: Gradually Appeared Gradually Appeared Gradually Appeared Primary Etiology: Venous Leg Ulcer Venous Leg Ulcer Venous Leg Ulcer Comorbid History: Cataracts, Arrhythmia, Cataracts, Arrhythmia, Cataracts, Arrhythmia, Hypertension, Type II Diabetes, Hypertension, Type II Diabetes, Hypertension, Type II Diabetes, Osteoarthritis, Neuropathy Osteoarthritis, Neuropathy Osteoarthritis, Neuropathy Date Acquired: 06/08/2021 04/18/2021 06/01/2021 Weeks of Treatment: 10 9 11  Wound Status: Open Open Open Wound Recurrence: No No No Clustered Wound: Yes Yes No Measurements L x W x D (cm) 0.1x0.1x0.1 1x0.5x0.1 0.1x0.1x0.1 Area (cm) : 0.008 0.393 0.008 Volume (cm) : 0.001 0.039 0.001 % Reduction in Area: 98.60% 98.70% 99.70% % Reduction in Volume: 98.30% 98.70% 99.60% Classification: Partial Thickness Full Thickness Without Exposed Full Thickness Without Exposed Support Structures Support Structures Exudate Amount: Medium Large None Present Exudate Type:  Serosanguineous Serosanguineous N/A Exudate Color: red, brown red, brown N/A Granulation Amount: None Present (0%) Large (67-100%) None Present (0%) Granulation Quality: N/A Red N/A Necrotic Amount: None Present (0%) Small (1-33%) None Present (0%) Exposed Structures: Fascia: No Fat Layer (Subcutaneous Tissue): Fat Layer (Subcutaneous Tissue): Fat Layer (Subcutaneous Tissue): Yes Yes No Fascia: No Fascia: No Tendon: No Tendon: No Tendon: No Muscle: No Muscle: No Muscle: No Joint: No Joint: No Joint: No Bone: No Bone: No Bone: No Epithelialization: Large (67-100%) None Large (67-100%) Assessment Notes: N/A N/A dry skin noted Treatment Notes Electronic Signature(s) Signed: 08/17/2021 4:28:37 PM By: Levora Dredge Entered By: Levora Dredge on 08/17/2021 15:45:44 [name] ([id number] [name] ([id number]) -------------------------------------------------------------------------------- [place] Details Patient Name: [name]. Date of Service: 08/17/2021 3:00 PM Medical Record Number: [id number] Patient Account  Number: [id number] Date of Birth/Sex: 11-10-1950 (71 y.o. F) Treating RN: Levora Dredge Primary Care Beecher Furio: Rutherford Guys Other Clinician: Referring Mehar Sagen: Rutherford Guys Treating Natassia Guthridge/Extender: Yaakov Guthrie in Treatment: 20 Active Inactive Wound/Skin Impairment Nursing Diagnoses: Impaired tissue integrity Goals: Patient/caregiver will verbalize understanding of skin care regimen Date Initiated: 03/30/2021 Date Inactivated: 04/27/2021 Target Resolution Date: 03/30/2021 Goal Status: Met Ulcer/skin breakdown will have a volume reduction of 30% by week 4 Date Initiated: 03/30/2021 Date Inactivated: 05/11/2021 Target Resolution Date: 04/30/2021 Goal Status: Met Ulcer/skin breakdown will have a volume reduction of 50% by week 8 Date Initiated: 03/30/2021 Date Inactivated: 05/25/2021 Target Resolution Date:  05/30/2021 Goal Status: Met Ulcer/skin breakdown will have a volume reduction of 80% by week 12 Date Initiated: 03/30/2021 Target Resolution Date: 06/30/2021 Goal Status: Active Ulcer/skin breakdown will heal within 14 weeks Date Initiated: 03/30/2021 Target Resolution Date: 07/31/2021 Goal Status: Active Interventions: Assess patient/caregiver ability to obtain necessary supplies Assess patient/caregiver ability to perform ulcer/skin care regimen upon admission and as needed Assess ulceration(s) every visit Treatment Activities: Patient referred to home care : 03/30/2021 Referred to DME Aithana Kushner for dressing supplies : 03/30/2021 Skin care regimen initiated : 03/30/2021 Notes: Electronic Signature(s) Signed: 08/17/2021 4:28:37 PM By: Levora Dredge Entered By: Levora Dredge on 08/17/2021 15:37:48 [name] ([id number]) -------------------------------------------------------------------------------- Pain Assessment Details Patient Name: [name]. Date of Service: 08/17/2021 3:00 PM Medical Record Number: [id number] Patient Account Number: [id number] Date of Birth/Sex: [birth year] (71 y.o. F) Treating RN: Levora Dredge Primary Care Ethie Curless: Rutherford Guys Other Clinician: Referring Sarina Robleto: Rutherford Guys Treating Briseis Aguilera/Extender: Yaakov Guthrie in Treatment: 20 Active Problems Location of Pain Severity and Description of Pain Patient Has Paino No Site Locations Rate the pain. Current Pain Level: 0 Pain Management and Medication Current Pain Management: Electronic Signature(s) Signed: 08/17/2021 4:28:37 PM By: Levora Dredge Entered By: Levora Dredge on 08/17/2021 15:34:49 [name] ([id number]) -------------------------------------------------------------------------------- Patient/Caregiver Education Details Patient Name: [name]. Date of Service: 08/17/2021 3:00 PM Medical Record Number: [id number] Patient Account Number: [id number] Date  of Birth/Gender: 1950-11-18 (71 y.o. F) Treating RN: Levora Dredge Primary Care Physician: Rutherford Guys Other Clinician: Referring Physician: Rutherford Guys Treating Physician/Extender: Yaakov Guthrie in Treatment: 20 Education Assessment Education Provided To: Patient Education Topics Provided Wound/Skin Impairment: Handouts: Caring for Your Ulcer Methods: Explain/Verbal Responses: State content correctly Electronic Signature(s) Signed: 08/17/2021 4:28:37 PM By: Levora Dredge Entered By: Levora Dredge on 08/17/2021 16:15:59 [name] ([id number]) -------------------------------------------------------------------------------- Wound Assessment Details Patient Name: [name]. Date of Service: 08/17/2021 3:00 PM Medical Record Number: [id number] Patient Account Number: [id number] Date of Birth/Sex: 20-Dec-1950 (71 y.o. F) Treating RN: Levora Dredge Primary Care Yena Tisby: Rutherford Guys Other Clinician: Referring Amelie Caracci: Rutherford Guys Treating Tynika Luddy/Extender: Yaakov Guthrie in Treatment: 20 Wound Status Wound Number: 10 Primary Venous Leg Ulcer Etiology: Wound Location: Right, Proximal, Anterior Lower Leg Wound Status: Healed - Epithelialized Wounding Event: Gradually Appeared Comorbid Cataracts, Arrhythmia, Hypertension, Type II Diabetes, Date Acquired: 06/08/2021 History: Osteoarthritis, Neuropathy Weeks Of Treatment: 10 Clustered Wound: Yes Photos Wound Measurements Length: (cm) 0 Width: (cm) 0 Depth: (cm) 0 Area: (cm) 0 Volume: (cm) 0 % Reduction in Area: 100% % Reduction in Volume: 100% Epithelialization: Large (67-100%) Tunneling: No Undermining: No Wound Description Classification: Partial Thickness Exudate Amount: Medium Exudate Type: Serosanguineous Exudate Color: red, brown Foul Odor After Cleansing: No Slough/Fibrino Yes Wound Bed Granulation Amount: None Present (0%) Exposed Structure Necrotic Amount: None  Present (0%) Fascia Exposed: No Fat Layer (Subcutaneous Tissue)  Exposed: No Tendon Exposed: No Muscle Exposed: No Joint Exposed: No Bone Exposed: No Treatment Notes Wound #10 (Lower Leg) Wound Laterality: Right, Anterior, Proximal Cleanser Peri-Wound Care Topical Primary Dressing [name], [name] ([id number]) Secondary Dressing Secured With Compression Wrap Compression Stockings Add-Ons Electronic Signature(s) Signed: 08/17/2021 4:28:37 PM By: Levora Dredge Entered By: Levora Dredge on 08/17/2021 15:54:23 [name] ([id number]) -------------------------------------------------------------------------------- Wound Assessment Details Patient Name: [name]. Date of Service: 08/17/2021 3:00 PM Medical Record Number: [id number] Patient Account Number: [id number] Date of Birth/Sex: 1950-09-04 (71 y.o. F) Treating RN: Levora Dredge Primary Care Lemario Chaikin: Rutherford Guys Other Clinician: Referring Georgeana Oertel: Rutherford Guys Treating Huntleigh Doolen/Extender: Yaakov Guthrie in Treatment: 20 Wound Status Wound Number: 11 Primary Venous Leg Ulcer Etiology: Wound Location: Right Lower Leg Wound Status: Open Wounding Event: Gradually Appeared Comorbid Cataracts, Arrhythmia, Hypertension, Type II Diabetes, Date Acquired: 04/18/2021 History: Osteoarthritis, Neuropathy Weeks Of Treatment: 9 Clustered Wound: Yes Photos Wound Measurements Length: (cm) 1 Width: (cm) 0.5 Depth: (cm) 0.1 Area: (cm) 0.393 Volume: (cm) 0.039 % Reduction in Area: 98.7% % Reduction in Volume: 98.7% Epithelialization: None Tunneling: No Undermining: No Wound Description Classification: Full Thickness Without Exposed Support Structures Exudate Amount: Large Exudate Type: Serosanguineous Exudate Color: red, brown Foul Odor After Cleansing: No Slough/Fibrino Yes Wound Bed Granulation Amount: Large (67-100%) Exposed Structure Granulation Quality: Red Fascia Exposed: No Necrotic  Amount: Small (1-33%) Fat Layer (Subcutaneous Tissue) Exposed: Yes Necrotic Quality: Adherent Slough Tendon Exposed: No Muscle Exposed: No Joint Exposed: No Bone Exposed: No Treatment Notes Wound #11 (Lower Leg) Wound Laterality: Right Cleanser Wound Cleanser Discharge Instruction: Wash your hands with soap and water. Remove old dressing, discard into plastic bag and place into trash. Cleanse the wound with Wound Cleanser prior to applying a clean dressing using gauze sponges, not tissues or cotton balls. Do not scrub or use excessive force. Pat dry using gauze sponges, not tissue or cotton balls. [name], [name] ([id number]) Peri-Wound Care Topical Gentamicin Discharge Instruction: Apply as directed by Tuvia Woodrick. in office only Santyl Collagenase Ointment, 30 (gm), tube Discharge Instruction: apply nickel thick to wound bed only. IN office only Primary Dressing Hydrofera Blue Ready Transfer Foam, 4x5 (in/in) Discharge Instruction: Apply Hydrofera Blue Ready to wound bed as directed Secondary Dressing ABD Pad 5x9 (in/in) Discharge Instruction: Cover with ABD pad cut in half length wise to place on shin prior to placing unna boot to protect skin when removing Zetuvit Plus Silicone Non-bordered 5x5 (in/in) Discharge Instruction: OR ALTERNATIVE ABSORBANT PAD Secured With Compression Wrap Unna Boot 4x10 (in/yd) Discharge Instruction: Alger Simons, Kerlix and Coban from base of toes to three finger widths below bend in knee. Compression Stockings Add-Ons Electronic Signature(s) Signed: 08/17/2021 4:28:37 PM By: Levora Dredge Entered By: Levora Dredge on 08/17/2021 15:36:20 [name] ([id number]) -------------------------------------------------------------------------------- Wound Assessment Details Patient Name: [name]. Date of Service: 08/17/2021 3:00 PM Medical Record Number: [id number] Patient Account Number: [id number] Date of Birth/Sex: 16-May-1951 (70 y.o.  F) Treating RN: Levora Dredge Primary Care Kaesen Rodriguez: Rutherford Guys Other Clinician: Referring Lonisha Bobby: Rutherford Guys Treating Amire Leazer/Extender: Yaakov Guthrie in Treatment: 20 Wound Status Wound Number: 9 Primary Venous Leg Ulcer Etiology: Wound Location: Right, Posterior Lower Leg Wound Status: Healed - Epithelialized Wounding Event: Gradually Appeared Comorbid Cataracts, Arrhythmia, Hypertension, Type II Diabetes, Date Acquired: 06/01/2021 History: Osteoarthritis, Neuropathy Weeks Of Treatment: 11 Clustered Wound: No Photos Wound Measurements Length: (cm) 0 Width: (cm) 0 Depth: (cm) 0 Area: (cm) 0 Volume: (cm) 0 % Reduction in Area: 100% %  Reduction in Volume: 100% Epithelialization: Large (67-100%) Tunneling: No Undermining: No Wound Description Classification: Full Thickness Without Exposed Support Structure Exudate Amount: None Present s Foul Odor After Cleansing: No Slough/Fibrino No Wound Bed Granulation Amount: None Present (0%) Exposed Structure Necrotic Amount: None Present (0%) Fascia Exposed: No Fat Layer (Subcutaneous Tissue) Exposed: Yes Tendon Exposed: No Muscle Exposed: No Joint Exposed: No Bone Exposed: No Assessment Notes dry skin noted Treatment Notes Wound #9 (Lower Leg) Wound Laterality: Right, Posterior Cleanser Peri-Wound Care Topical [name], [name] ([id number]) Primary Dressing Secondary Dressing Secured With Compression Wrap Compression Stockings Add-Ons Electronic Signature(s) Signed: 08/17/2021 4:28:37 PM By: Levora Dredge Entered By: Levora Dredge on 08/17/2021 15:54:09 [name] ([id number]) -------------------------------------------------------------------------------- Vitals Details Patient Name: [name]. Date of Service: 08/17/2021 3:00 PM Medical Record Number: [id number] Patient Account Number: [id number] Date of Birth/Sex: 12-22-1950 (71 y.o. F) Treating RN: Levora Dredge Primary Care  Archer Moist: Rutherford Guys Other Clinician: Referring Shizuo Biskup: Rutherford Guys Treating Oreoluwa Gilmer/Extender: Yaakov Guthrie in Treatment: 20 Vital Signs Time Taken: 15:24 Temperature (F): 97.8 Height (in): 66 Pulse (bpm): 91 Weight (lbs): 332 Respiratory Rate (breaths/min): 20 Body Mass Index (BMI): 53.6 Blood Pressure (mmHg): 165/84 Reference Range: 80 - 120 mg / dl Electronic Signature(s) Signed: 08/17/2021 4:28:37 PM By: Levora Dredge Entered By: Levora Dredge on 08/17/2021 15:25:00

## 2021-08-24 ENCOUNTER — Other Ambulatory Visit: Payer: Self-pay

## 2021-08-24 ENCOUNTER — Encounter: Payer: Medicare Other | Attending: Internal Medicine | Admitting: Internal Medicine

## 2021-08-24 DIAGNOSIS — A4151 Sepsis due to Escherichia coli [E. coli]: Secondary | ICD-10-CM | POA: Diagnosis not present

## 2021-08-24 DIAGNOSIS — S91302D Unspecified open wound, left foot, subsequent encounter: Secondary | ICD-10-CM | POA: Diagnosis not present

## 2021-08-24 DIAGNOSIS — I87331 Chronic venous hypertension (idiopathic) with ulcer and inflammation of right lower extremity: Secondary | ICD-10-CM | POA: Diagnosis not present

## 2021-08-24 DIAGNOSIS — I87332 Chronic venous hypertension (idiopathic) with ulcer and inflammation of left lower extremity: Secondary | ICD-10-CM | POA: Diagnosis not present

## 2021-08-24 DIAGNOSIS — A419 Sepsis, unspecified organism: Secondary | ICD-10-CM | POA: Diagnosis not present

## 2021-08-24 DIAGNOSIS — L97812 Non-pressure chronic ulcer of other part of right lower leg with fat layer exposed: Secondary | ICD-10-CM | POA: Diagnosis not present

## 2021-08-24 NOTE — Progress Notes (Signed)
[name] ([id number]) Visit Report for 08/24/2021 Chief Complaint Document Details Patient Name: [name], [name]. Date of Service: 08/24/2021 2:30 PM Medical Record Number: [id number] Patient Account Number: [id number] Date of Birth/Sex: [birth year] (71 y.o. F) Treating RN: Donnamarie Poag Primary Care Provider: Rutherford Guys Other Clinician: Referring Provider: Rutherford Guys Treating Provider/Extender: Yaakov Guthrie in Treatment: 21 Information Obtained from: Patient Chief Complaint Bilateral lower extremity wounds Electronic Signature(s) Signed: 08/24/2021 2:55:54 PM By: Kalman Shan DO Entered By: Kalman Shan on 08/24/2021 14:41:56 [name] ([id number]) -------------------------------------------------------------------------------- HPI Details Patient Name: [name]. Date of Service: 08/24/2021 2:30 PM Medical Record Number: [id number] Patient Account Number: [id number] Date of Birth/Sex: March 25, 1951 (71 y.o. F) Treating RN: Donnamarie Poag Primary Care Provider: Rutherford Guys Other Clinician: Referring Provider: Rutherford Guys Treating Provider/Extender: Yaakov Guthrie in Treatment: 21 History of Present Illness HPI Description: Admission 10/5 [name] is a 71 year old female with a past medical history of hypertension, venous insufficiency, chronic diastolic heart failure and insulin-dependent type 2 diabetes that presents To the clinic for bilateral lower extremity wounds. She states that her right lower extremity developed a blister 1 week ago and she has been keeping the area covered with a bandaid. She states that when the Band-Aid came off it tore her skin. She subsequently developed pain, increased warmth and erythema to the right lower extremity. She denies purulent drainage. She has also developed an open wound to the left lower extremity that recently developed. She also has a small open wound to the proximal portion of the left great  toe. This has been present since August and was evaluated by podiatry. It has not healed and she has not followed up with podiatry for this issue. 10/12; patient presents for follow-up. She states she has 2 days left on her antibiotics. She is tolerated this well. She reports improvement to her right lower extremity symptoms. She denies signs of infection. She has home health. 10/26; patient presents for follow-up. She has no issues or complaints today. The compression wraps slid down to her shin. These were placed on Monday by home health. She has a couple of new open wounds due to this. She denies signs of infection. 11/2; patient presents for follow-up. The compression wraps had slid down to her shin again. The wraps taken off today do not appear to be appropriate material for 3 layer compression wrap. She currently denies signs of infection. 11/9; patient presents for follow-up. She was able to increase her diuretic over the past week. Again home health does not have appropriate materials for 3 layer compression. She currently denies signs of infection. 11/16; patient presents for follow-up. She has no issues or complaints today. She currently denies signs of infection. 11/30; patient presents for follow-up. The wrap continues to have a hard time staying in place. Overall she is doing well with no issues or complaints today. 12/7; patient presents for follow-up. She brought her juxta light compression today. She states however she cannot get these on daily. She has no issues or complaints today. 12/14; patient presents for follow-up. She states that the wrap rolled and created a new wound to her right lower extremity. Other than that she has no issues or complaints today. 12/21; patient presents for follow-up. The compression wraps continue to have a hard time staying in place. They again have rolled down her leg. 12/28; patient presents for follow-up. She has no issues or complaints  today. 1/11; patient presents for follow-up. She missed  her last clinic appointment due to transportation issues. She has no issues or complaints today. 1/18; patient presents for follow-up. She is been using her juxta light compression to the left lower extremity without issues. Home health has been changing the wrap on the right leg. She denies signs of infection today. 1/25; patient presents for follow-up. She is continuing to use her juxta light compression to the left lower extremity without issues. Home health has been changing the wrap on the right leg and she denies any issues to this area. She denies signs of infection. 2/9; patient presents for follow-up. She has no issues or complaints today. 2/15; patient presents for follow-up. She has no issues or complaints. She is in good spirits today. 2/22; patient presents for follow-up. She has no issues or complaints today. 3/1; patient presents for follow-up. She tolerated the [employer] well. She denies signs of infection. Electronic Signature(s) Signed: 08/24/2021 2:55:54 PM By: Kalman Shan DO Entered By: Kalman Shan on 08/24/2021 14:42:14 [name] ([id number]) -------------------------------------------------------------------------------- Physical Exam Details Patient Name: [name]. Date of Service: 08/24/2021 2:30 PM Medical Record Number: [id number] Patient Account Number: [id number] Date of Birth/Sex: Aug 22, 1950 (71 y.o. F) Treating RN: Donnamarie Poag Primary Care Provider: Rutherford Guys Other Clinician: Referring Provider: Rutherford Guys Treating Provider/Extender: Yaakov Guthrie in Treatment: 21 Constitutional . Cardiovascular . Psychiatric . Notes Patient has 1 wound remaining to the distal lateral aspect of the right lower extremity. This is granulation tissue and scant nonviable tissue. No signs of surrounding infection. Decent edema control. Electronic Signature(s) Signed: 08/24/2021 2:55:54 PM  By: Kalman Shan DO Entered By: Kalman Shan on 08/24/2021 14:42:47 [name] ([id number]) -------------------------------------------------------------------------------- Physician Orders Details Patient Name: [name]. Date of Service: 08/24/2021 2:30 PM Medical Record Number: [id number] Patient Account Number: [id number] Date of Birth/Sex: [birth year] (71 y.o. F) Treating RN: Donnamarie Poag Primary Care Provider: Rutherford Guys Other Clinician: Referring Provider: Rutherford Guys Treating Provider/Extender: Yaakov Guthrie in Treatment: 33 Verbal / Phone Orders: No Diagnosis Coding Follow-up Appointments o Return Appointment in 1 week. o Nurse Visit as needed [place]: - [facility]-assist learning and application of Juxtelite velcro wrap Left leg o [facility] for wound care. May utilize formulary equivalent dressing for wound treatment orders unless otherwise specified. Home Health Nurse may visit PR[facility] to address patientos wound care needs. o Scheduled days for dressing changes to be completed; exception, patient has scheduled wound care visit that day. o **Please direct any NON-WOUND related issues/requests for orders to patient's Primary Care Physician. **If current dressing causes regression in wound condition, may D/C ordered dressing product/s and apply Normal Saline Moist Dressing daily until next [place] or Other MD appointment. **[place] of regression in wound condition at [place] Orders/Instructions: - Assist to remove and replace left leg JUXTELITE WRAP, wash leg and apply lotion to left leg DTI left great toe distal apply skin prep when changing socks and keep pressure off of that toe Bathing/ Shower/ Hygiene o May shower; gently cleanse wound with antibacterial soap, rinse and pat dry prior to dressing wounds o May shower  with wound dressing protected with water repellent cover or cast protector. o No tub bath. Anesthetic (Use 'Patient Medications' Section for Anesthetic Order Entry) o Lidocaine applied to wound bed Edema Control - Lymphedema / Segmental Compressive Device / Other o Optional: One layer of unna paste to top of  compression wrap (to act as an anchor). - please use or compression wrap will not stay up-RIGHT LEG o Patient to wear own Velcro compression garment. Remove compression stockings every night before going to bed and put on every morning when getting up. - START JUXTELITE VELCRO WRAP TO LEFT LEG-assist with don and doff and education as well as assistive device of sock donner o Elevate leg(s) parallel to the floor when sitting. o DO YOUR BEST to sleep in the bed at night. DO NOT sleep in your recliner. Long hours of sitting in a recliner leads to swelling of the legs and/or potential wounds on your backside. o Other: - Kerlix and Coban RIGHT LEG ONLY-wrap base of toes to behind knee-same as other leg bilateral wrap lower compression Non-Wound Condition o Additional non-wound orders/instructions: - Zinc oxide to any excoriated areas near drainage on right lower leg Additional Orders / Instructions o Follow Nutritious Diet and Increase Protein Intake Medications-Please add to medication list. o Other: - follow with PCP about Lasix and Potassium tabs so that labwork may be done so meds can be adjusted Wound Treatment Wound #11 - Lower Leg Wound Laterality: Right Cleanser: Wound Cleanser 3 x Per Week/30 Days Discharge Instructions: Wash your hands with soap and water. Remove old dressing, discard into plastic bag and place into trash. Cleanse the wound with Wound Cleanser prior to applying a clean dressing using gauze sponges, not tissues or cotton balls. Do not scrub or use excessive force. Pat dry using gauze sponges, not tissue or cotton balls. Peri-Wound Care:  Triamcinolone Acetonide Cream, 0.1%, 15 (g) tube 3 x Per Week/30 Days Discharge Instructions: Apply as directed. TO RED AREAS RLL Topical: Gentamicin 3 x Per Week/30 Days Discharge Instructions: IN OFFICE ONLY MIXED WITH SANTYL-Apply as directed by provider. in office only Topical: Santyl Collagenase Ointment, 30 (gm), tube 3 x Per Week/30 Days [name], [name]. ([id number]) Discharge Instructions: IN OFFICE ONLY MIXED WITH GENT-apply nickel thick to wound bed only. IN office only Primary Dressing: Hydrofera Blue Ready Transfer Foam, 4x5 (in/in) 3 x Per Week/30 Days Discharge Instructions: Apply Hydrofera Blue Ready to wound bed as directed Secondary Dressing: ABD Pad 5x9 (in/in) 3 x Per Week/30 Days Discharge Instructions: PAD SHIN UNDER UNNA WRAP AND OVER WOUND--Cover with ABD pad cut in half length wise to place on shin prior to placing unna boot to protect skin when removing Compression Wrap: Unna Boot 4x10 (in/yd) 3 x Per Week/30 Days Discharge Instructions: Unna Paste, Kerlix and Coban from base of toes to three finger widths below bend in knee. Electronic Signature(s) Signed: 08/24/2021 2:55:54 PM By: Kalman Shan DO Entered By: Kalman Shan on 08/24/2021 14:44:08 [name] ([id number]) -------------------------------------------------------------------------------- Problem List Details Patient Name: [name]. Date of Service: 08/24/2021 2:30 PM Medical Record Number: [id number] Patient Account Number: [id number] Date of Birth/Sex: Jul 07, 1950 (71 y.o. F) Treating RN: Donnamarie Poag Primary Care Provider: Rutherford Guys Other Clinician: Referring Provider: Rutherford Guys Treating Provider/Extender: Yaakov Guthrie in Treatment: 21 Active Problems ICD-10 Encounter Code Description Active Date MDM Diagnosis L97.812 Non-pressure chronic ulcer of other part of right lower leg with fat layer 06/08/2021 No Yes exposed I87.331 Chronic venous hypertension (idiopathic)  with ulcer and inflammation of 03/30/2021 No Yes right lower extremity I87.332 Chronic venous hypertension (idiopathic) with ulcer and inflammation of 03/30/2021 No Yes left lower extremity S91.302D Unspecified open wound, left foot, subsequent encounter 03/30/2021 No Yes Inactive Problems Resolved Problems Electronic Signature(s) Signed: 08/24/2021 2:55:54 PM By: Heber Bush,  Janett Billow DO Entered By: Kalman Shan on 08/24/2021 14:41:49 [name] ([id number]) -------------------------------------------------------------------------------- Progress Note Details Patient Name: [name]. Date of Service: 08/24/2021 2:30 PM Medical Record Number: [id number] Patient Account Number: [id number] Date of Birth/Sex: 1950/10/17 (71 y.o. F) Treating RN: Donnamarie Poag Primary Care Provider: Rutherford Guys Other Clinician: Referring Provider: Rutherford Guys Treating Provider/Extender: Yaakov Guthrie in Treatment: 21 Subjective Chief Complaint Information obtained from Patient Bilateral lower extremity wounds History of Present Illness (HPI) Admission 10/5 Ms. Catia Todorov is a 71 year old female with a past medical history of hypertension, venous insufficiency, chronic diastolic heart failure and insulin-dependent type 2 diabetes that presents To the clinic for bilateral lower extremity wounds. She states that her right lower extremity developed a blister 1 week ago and she has been keeping the area covered with a bandaid. She states that when the Band-Aid came off it tore her skin. She subsequently developed pain, increased warmth and erythema to the right lower extremity. She denies purulent drainage. She has also developed an open wound to the left lower extremity that recently developed. She also has a small open wound to the proximal portion of the left great toe. This has been present since August and was evaluated by podiatry. It has not healed and she has not followed up with podiatry  for this issue. 10/12; patient presents for follow-up. She states she has 2 days left on her antibiotics. She is tolerated this well. She reports improvement to her right lower extremity symptoms. She denies signs of infection. She has home health. 10/26; patient presents for follow-up. She has no issues or complaints today. The compression wraps slid down to her shin. These were placed on Monday by home health. She has a couple of new open wounds due to this. She denies signs of infection. 11/2; patient presents for follow-up. The compression wraps had slid down to her shin again. The wraps taken off today do not appear to be appropriate material for 3 layer compression wrap. She currently denies signs of infection. 11/9; patient presents for follow-up. She was able to increase her diuretic over the past week. Again home health does not have appropriate materials for 3 layer compression. She currently denies signs of infection. 11/16; patient presents for follow-up. She has no issues or complaints today. She currently denies signs of infection. 11/30; patient presents for follow-up. The wrap continues to have a hard time staying in place. Overall she is doing well with no issues or complaints today. 12/7; patient presents for follow-up. She brought her juxta light compression today. She states however she cannot get these on daily. She has no issues or complaints today. 12/14; patient presents for follow-up. She states that the wrap rolled and created a new wound to her right lower extremity. Other than that she has no issues or complaints today. 12/21; patient presents for follow-up. The compression wraps continue to have a hard time staying in place. They again have rolled down her leg. 12/28; patient presents for follow-up. She has no issues or complaints today. 1/11; patient presents for follow-up. She missed her last clinic appointment due to transportation issues. She has no issues or  complaints today. 1/18; patient presents for follow-up. She is been using her juxta light compression to the left lower extremity without issues. Home health has been changing the wrap on the right leg. She denies signs of infection today. 1/25; patient presents for follow-up. She is continuing to use her juxta light compression to the  left lower extremity without issues. Home health has been changing the wrap on the right leg and she denies any issues to this area. She denies signs of infection. 2/9; patient presents for follow-up. She has no issues or complaints today. 2/15; patient presents for follow-up. She has no issues or complaints. She is in good spirits today. 2/22; patient presents for follow-up. She has no issues or complaints today. 3/1; patient presents for follow-up. She tolerated the [employer] well. She denies signs of infection. [name], [name] ([id number]) Objective Constitutional Vitals Time Taken: 2:25 PM, Height: 66 in, Weight: 332 lbs, BMI: 53.6, Temperature: 98.1 F, Pulse: 98 bpm, Respiratory Rate: 20 breaths/min, Blood Pressure: 149/76 mmHg. General Notes: Patient has 1 wound remaining to the distal lateral aspect of the right lower extremity. This is granulation tissue and scant nonviable tissue. No signs of surrounding infection. Decent edema control. Integumentary (Hair, Skin) Wound #11 status is Open. Original cause of wound was Gradually Appeared. The date acquired was: 04/18/2021. The wound has been in treatment 10 weeks. The wound is located on the Right Lower Leg. The wound measures 0.5cm length x 0.5cm width x 0.1cm depth; 0.196cm^2 area and 0.02cm^3 volume. There is Fat Layer (Subcutaneous Tissue) exposed. There is no tunneling or undermining noted. There is a large amount of serosanguineous drainage noted. There is large (67-100%) red granulation within the wound bed. There is a small (1-33%) amount of necrotic tissue within the wound bed including Adherent  Slough. Assessment Active Problems ICD-10 Non-pressure chronic ulcer of other part of right lower leg with fat layer exposed Chronic venous hypertension (idiopathic) with ulcer and inflammation of right lower extremity Chronic venous hypertension (idiopathic) with ulcer and inflammation of left lower extremity Unspecified open wound, left foot, subsequent encounter Patient's wound has shown improvement in size and appearance since last clinic visit. I recommended continuing current therapy of Hydrofera Blue, gentamicin and Santyl under [employer]. Follow-up in 1 week. Plan 1. Hydrofera Blue, gentamicin and Santyl ointment under Unna boot 2. Follow-up in 1 week Electronic Signature(s) Signed: 08/24/2021 2:55:54 PM By: Kalman Shan DO Entered By: Kalman Shan on 08/24/2021 14:43:30 [name] ([id number]) -------------------------------------------------------------------------------- [place] Details Patient Name: [name]. Date of Service: 08/24/2021 Medical Record Number: [id number] Patient Account Number: [id number] Date of Birth/Sex: [birth year] (71 y.o. F) Treating RN: Donnamarie Poag Primary Care Provider: Rutherford Guys Other Clinician: Referring Provider: Rutherford Guys Treating Provider/Extender: Yaakov Guthrie in Treatment: 21 Diagnosis Coding ICD-10 Codes Code Description [phone] Non-pressure chronic ulcer of other part of right lower leg with fat layer exposed I87.331 Chronic venous hypertension (idiopathic) with ulcer and inflammation of right lower extremity I87.332 Chronic venous hypertension (idiopathic) with ulcer and inflammation of left lower extremity S91.302D Unspecified open wound, left foot, subsequent encounter Physician Procedures CPT4 Code Description: [id number] - WC PHYS LEVEL 3 - EST PT Modifier: Quantity: 1 CPT4 Code Description: ICD-10 Diagnosis Description I20.355 Non-pressure chronic ulcer of other part of right lower leg with  fat lay I87.331 Chronic venous hypertension (idiopathic) with ulcer and inflammation of I87.332 Chronic venous hypertension  (idiopathic) with ulcer and inflammation of S91.302D Unspecified open wound, left foot, subsequent encounter Modifier: er exposed right lower extremit left lower extremity Quantity: y Engineer, maintenance) Signed: 08/24/2021 3:05:53 PM By: Donnamarie Poag Previous Signature: 08/24/2021 2:55:54 PM Version By: Kalman Shan DO Entered By: Donnamarie Poag on 08/24/2021 14:57:22

## 2021-08-24 NOTE — Progress Notes (Signed)
Miranda, [name] ([id number]) Visit Report for 08/24/2021 Arrival Information Details Patient Name: Miranda, [name]. Date of Service: 08/24/2021 2:30 PM Medical Record Number: [id number] Patient Account Number: [id number] Date of Birth/Sex: Aug 09, 1950 (71 y.o. F) Treating RN: Donnamarie Poag Primary Care Ibrohim Simmers: Rutherford Guys Other Clinician: Referring Charli Liberatore: Rutherford Guys Treating Daric Koren/Extender: Yaakov Guthrie in Treatment: 21 Visit Information History Since Last Visit Added or deleted any medications: No Patient Arrived: [name] Had a fall or experienced change in No Arrival Time: 14:24 activities of daily living that may affect Accompanied By: self risk of falls: Transfer Assistance: Manual Hospitalized since last visit: No Patient Identification Verified: Yes Has Dressing in Place as Prescribed: Yes Secondary Verification Process Completed: Yes Has Compression in Place as Prescribed: Yes Patient Requires Transmission-Based No Pain Present Now: No Precautions: Patient Has Alerts: Yes Patient Alerts: Patient on Blood Thinner DIABETIC ELIQUIS Electronic Signature(s) Signed: 08/24/2021 3:05:53 PM By: Donnamarie Poag Entered By: Donnamarie Poag on 08/24/2021 14:25:51 [name] ([id number]) -------------------------------------------------------------------------------- Clinic Level of Care Assessment Details Patient Name: [name]. Date of Service: 08/24/2021 2:30 PM Medical Record Number: [id number] Patient Account Number: [id number] Date of Birth/Sex: 1951/02/24 (71 y.o. F) Treating RN: Donnamarie Poag Primary Care Raygen Dahm: Rutherford Guys Other Clinician: Referring Valaria Kohut: Rutherford Guys Treating Kort Stettler/Extender: Yaakov Guthrie in Treatment: 21 Clinic Level of Care Assessment Items TOOL 4 Quantity Score _0  - Use when only an EandM is performed on FOLLOW-UP visit 0 ASSESSMENTS - Nursing Assessment / Reassessment _1  - Reassessment of Co-morbidities  (includes updates in patient status) 0 _2  - 0 Reassessment of Adherence to Treatment Plan ASSESSMENTS - Wound and Skin Assessment / Reassessment X - Simple Wound Assessment / Reassessment - one wound 1 5 _3  - 0 Complex Wound Assessment / Reassessment - multiple wounds _4  - 0 Dermatologic / Skin Assessment (not related to wound area) ASSESSMENTS - Focused Assessment _5  - Circumferential Edema Measurements - multi extremities 0 _6  - 0 Nutritional Assessment / Counseling / Intervention _7  - 0 Lower Extremity Assessment (monofilament, tuning fork, pulses) _8  - 0 Peripheral Arterial Disease Assessment (using hand held doppler) ASSESSMENTS - Ostomy and/or Continence Assessment and Care _9  - Incontinence Assessment and Management 0 _10  - 0 Ostomy Care Assessment and Management (repouching, etc.) PROCESS - Coordination of Care X - Simple Patient / Family Education for ongoing care 1 15 _11  - 0 Complex (extensive) Patient / Family Education for ongoing care X- 1 10 Staff obtains Programmer, systems, Records, Test Results / Process Orders X- 1 10 Staff telephones HHA, Nursing Homes / Clarify orders / etc _12  - 0 Routine Transfer to another Facility (non-emergent condition) _13  - 0 Routine Hospital Admission (non-emergent condition) _14  - 0 New Admissions / Biomedical engineer / Ordering NPWT, Apligraf, etc. _15  - 0 Emergency Hospital Admission (emergent condition) X- 1 10 Simple Discharge Coordination _16  - 0 Complex (extensive) Discharge Coordination PROCESS - Special Needs _17  - Pediatric / Minor Patient Management 0 _18  - 0 Isolation Patient Management _19  - 0 Hearing / Language / Visual special needs _20  - 0 Assessment of Community assistance (transportation, D/C planning, etc.) _21  - 0 Additional assistance / Altered mentation _22  - 0 Support Surface(s) Assessment (bed, cushion, seat, etc.) INTERVENTIONS - Wound Cleansing / Measurement [name], [name]. ([id number]) X- 1 5 Simple Wound  Cleansing - one wound _23  - 0 Complex Wound Cleansing - multiple wounds X- 1 5 Wound Imaging (photographs - any number of wounds) _24  - 0 Wound Tracing (instead of photographs) X-  1 5 Simple Wound Measurement - one wound _0  - 0 Complex Wound Measurement - multiple wounds INTERVENTIONS - Wound Dressings X - Small Wound Dressing one or multiple wounds 1 10 _1  - 0 Medium Wound Dressing one or multiple wounds _2  - 0 Large Wound Dressing one or multiple wounds X- 1 5 Application of Medications - topical <UEAVWUJWJXBJYNWG>_9<\/FAOZHYQMVHQIONGE>_9  - 0 Application of Medications - injection INTERVENTIONS - Miscellaneous _4  - External ear exam 0 _5  - 0 Specimen Collection (cultures, biopsies, blood, body fluids, etc.) _6  - 0 Specimen(s) / Culture(s) sent or taken to Lab for analysis _7  - 0 Patient Transfer (multiple staff / Civil Service fast streamer / Similar devices) _8  - 0 Simple Staple / Suture removal (25 or less) _9  - 0 Complex Staple / Suture removal (26 or more) _10  - 0 Hypo / Hyperglycemic Management (close monitor of Blood Glucose) _11  - 0 Ankle / Brachial Index (ABI) - do not check if billed separately X- 1 5 Vital Signs Has the patient been seen at the hospital within the last three years: Yes Total Score: 85 Level Of Care: New/Established - Level 3 Electronic Signature(s) Signed: 08/24/2021 3:05:53 PM By: Donnamarie Poag Entered By: Donnamarie Poag on 08/24/2021 14:48:40 [name] ([id number]) -------------------------------------------------------------------------------- Encounter Discharge Information Details Patient Name: [name]. Date of Service: 08/24/2021 2:30 PM Medical Record Number: [id number] Patient Account Number: [id number] Date of Birth/Sex: [birth year] (71 y.o. F) Treating RN: Donnamarie Poag Primary Care Zorah Backes: Rutherford Guys Other Clinician: Referring Tylan Kinn: Rutherford Guys Treating Thersa Mohiuddin/Extender: Yaakov Guthrie in Treatment: 21 Encounter Discharge Information Items Discharge Condition:  Stable Ambulatory Status: Stretcher Discharge Destination: Home Health Telephoned: No Orders Sent: Yes Transportation: Other Accompanied By: self Schedule Follow-up Appointment: Yes Clinical Summary of Care: Electronic Signature(s) Signed: 08/24/2021 3:05:53 PM By: Donnamarie Poag Entered By: Donnamarie Poag on 08/24/2021 14:59:43 [name] ([id number]) -------------------------------------------------------------------------------- Lower Extremity Assessment Details Patient Name: [name]. Date of Service: 08/24/2021 2:30 PM Medical Record Number: [id number] Patient Account Number: [id number] Date of Birth/Sex: 08-Jun-1951 (71 y.o. F) Treating RN: Donnamarie Poag Primary Care Kolbi Altadonna: Rutherford Guys Other Clinician: Referring Jemarcus Dougal: Rutherford Guys Treating Peder Allums/Extender: Yaakov Guthrie in Treatment: 21 Edema Assessment Assessed: [Left: No] [Right: Yes] Edema: [Left: Ye] [Right: s] Calf Left: Right: Point of Measurement: 31 cm From Medial Instep 50 cm Ankle Left: Right: Point of Measurement: 10 cm From Medial Instep 23 cm Vascular Assessment Pulses: Dorsalis Pedis Palpable: [Right:Yes] Electronic Signature(s) Signed: 08/24/2021 3:05:53 PM By: Donnamarie Poag Entered By: Donnamarie Poag on 08/24/2021 14:34:13 [name] ([id number]) -------------------------------------------------------------------------------- Multi Wound Chart Details Patient Name: [name]. Date of Service: 08/24/2021 2:30 PM Medical Record Number: [id number] Patient Account Number: [id number] Date of Birth/Sex: 1950-11-19 (71 y.o. F) Treating RN: Donnamarie Poag Primary Care Elanna Bert: Rutherford Guys Other Clinician: Referring Ladene Allocca: Rutherford Guys Treating Sanya Kobrin/Extender: Yaakov Guthrie in Treatment: 21 Vital Signs Height(in): 66 Pulse(bpm): 98 Weight(lbs): 332 Blood Pressure(mmHg): 149/76 Body Mass Index(BMI): 53.6 Temperature(F): 98.1 Respiratory Rate(breaths/min):  20 Photos: [N/A:N/A] Wound Location: Right Lower Leg N/A N/A Wounding Event: Gradually Appeared N/A N/A Primary Etiology: Venous Leg Ulcer N/A N/A Comorbid History: Cataracts, Arrhythmia, N/A N/A Hypertension, Type II Diabetes, Osteoarthritis, Neuropathy Date Acquired: 04/18/2021 N/A N/A Weeks of Treatment: 10 N/A N/A Wound Status: Open N/A N/A Wound Recurrence: No N/A N/A Clustered Wound: Yes N/A N/A Measurements L x W x D (cm) 0.5x0.5x0.1 N/A N/A Area (cm) : 0.196 N/A N/A Volume (cm) : 0.02 N/A N/A % Reduction in Area: 99.30%  N/A N/A % Reduction in Volume: 99.30% N/A N/A Classification: Full Thickness Without Exposed N/A N/A Support Structures Exudate Amount: Large N/A N/A Exudate Type: Serosanguineous N/A N/A Exudate Color: red, brown N/A N/A Granulation Amount: Large (67-100%) N/A N/A Granulation Quality: Red N/A N/A Necrotic Amount: Small (1-33%) N/A N/A Exposed Structures: Fat Layer (Subcutaneous Tissue): N/A N/A Yes Fascia: No Tendon: No Muscle: No Joint: No Bone: No Epithelialization: None N/A N/A [name], [name] ([id number]) Treatment Notes Electronic Signature(s) Signed: 08/24/2021 3:05:53 PM By: Donnamarie Poag Entered By: Donnamarie Poag on 08/24/2021 14:36:56 [name] ([id number]) -------------------------------------------------------------------------------- [place] Details Patient Name: [name]. Date of Service: 08/24/2021 2:30 PM Medical Record Number: [id number] Patient Account Number: [id number] Date of Birth/Sex: [birth year] (71 y.o. F) Treating RN: Donnamarie Poag Primary Care Kynadie Yaun: Rutherford Guys Other Clinician: Referring Taneshia Lorence: Rutherford Guys Treating Zurich Carreno/Extender: Yaakov Guthrie in Treatment: 21 Active Inactive Wound/Skin Impairment Nursing Diagnoses: Impaired tissue integrity Goals: Patient/caregiver will verbalize understanding of skin care regimen Date Initiated: 03/30/2021 Date Inactivated:  04/27/2021 Target Resolution Date: 03/30/2021 Goal Status: Met Ulcer/skin breakdown will have a volume reduction of 30% by week 4 Date Initiated: 03/30/2021 Date Inactivated: 05/11/2021 Target Resolution Date: 04/30/2021 Goal Status: Met Ulcer/skin breakdown will have a volume reduction of 50% by week 8 Date Initiated: 03/30/2021 Date Inactivated: 05/25/2021 Target Resolution Date: 05/30/2021 Goal Status: Met Ulcer/skin breakdown will have a volume reduction of 80% by week 12 Date Initiated: 03/30/2021 Target Resolution Date: 06/30/2021 Goal Status: Active Ulcer/skin breakdown will heal within 14 weeks Date Initiated: 03/30/2021 Target Resolution Date: 07/31/2021 Goal Status: Active Interventions: Assess patient/caregiver ability to obtain necessary supplies Assess patient/caregiver ability to perform ulcer/skin care regimen upon admission and as needed Assess ulceration(s) every visit Treatment Activities: Patient referred to home care : 03/30/2021 Referred to DME Jayle Solarz for dressing supplies : 03/30/2021 Skin care regimen initiated : 03/30/2021 Notes: Electronic Signature(s) Signed: 08/24/2021 3:05:53 PM By: Donnamarie Poag Entered By: Donnamarie Poag on 08/24/2021 14:34:22 [name] ([id number]) -------------------------------------------------------------------------------- Non-Wound Condition Assessment Details Patient Name: [name]. Date of Service: 08/24/2021 2:30 PM Medical Record Number: [id number] Patient Account Number: [id number] Date of Birth/Sex: [birth year] (71 y.o. F) Treating RN: Donnamarie Poag Primary Care Roseana Rhine: Rutherford Guys Other Clinician: Referring Nakeem Murnane: Rutherford Guys Treating Pio Eatherly/Extender: Yaakov Guthrie in Treatment: 21 Non-Wound Condition: Condition: Suspected Deep Tissue Injury Location: Foot Side: Left Photos Electronic Signature(s) Signed: 08/24/2021 3:05:53 PM By: Donnamarie Poag Entered By: Donnamarie Poag on 08/24/2021 14:37:43 [name] ([id number]) -------------------------------------------------------------------------------- Pain Assessment Details Patient Name: [name]. Date of Service: 08/24/2021 2:30 PM Medical Record Number: [id number] Patient Account Number: [id number] Date of Birth/Sex: 18-Apr-1951 (71 y.o. F) Treating RN: Donnamarie Poag Primary Care Stephaney Steven: Rutherford Guys Other Clinician: Referring Shaniquia Brafford: Rutherford Guys Treating Cuma Polyakov/Extender: Yaakov Guthrie in Treatment: 21 Active Problems Location of Pain Severity and Description of Pain Patient Has Paino No Site Locations Rate the pain. Current Pain Level: 0 Pain Management and Medication Current Pain Management: Electronic Signature(s) Signed: 08/24/2021 3:05:53 PM By: Donnamarie Poag Entered By: Donnamarie Poag on 08/24/2021 14:28:43 [name] ([id number]) -------------------------------------------------------------------------------- Patient/Caregiver Education Details Patient Name: [name]. Date of Service: 08/24/2021 2:30 PM Medical Record Number: [id number] Patient Account Number: [id number] Date of Birth/Gender: 1950-07-08 (71 y.o. F) Treating RN: Donnamarie Poag Primary Care Physician: Rutherford Guys Other Clinician: Referring Physician: Rutherford Guys Treating Physician/Extender: Yaakov Guthrie in Treatment: 21 Education Assessment Education Provided To: Patient Education Topics Provided Basic Hygiene: Venous:  Wound/Skin Impairment: Electronic Signature(s) Signed: 08/24/2021 3:05:53 PM By: Donnamarie Poag Entered By: Donnamarie Poag on 08/24/2021 14:57:34 [name] ([id number]) -------------------------------------------------------------------------------- Wound Assessment Details Patient Name: [name]. Date of Service: 08/24/2021 2:30 PM Medical Record Number: [id number] Patient Account Number: [id number] Date of Birth/Sex: 1951/04/09 (71 y.o. F) Treating RN: Donnamarie Poag Primary Care Youcef Klas:  Rutherford Guys Other Clinician: Referring Arley Salamone: Rutherford Guys Treating Rya Rausch/Extender: Yaakov Guthrie in Treatment: 21 Wound Status Wound Number: 11 Primary Venous Leg Ulcer Etiology: Wound Location: Right Lower Leg Wound Status: Open Wounding Event: Gradually Appeared Comorbid Cataracts, Arrhythmia, Hypertension, Type II Diabetes, Date Acquired: 04/18/2021 History: Osteoarthritis, Neuropathy Weeks Of Treatment: 10 Clustered Wound: Yes Photos Wound Measurements Length: (cm) 0.5 Width: (cm) 0.5 Depth: (cm) 0.1 Area: (cm) 0.196 Volume: (cm) 0.02 % Reduction in Area: 99.3% % Reduction in Volume: 99.3% Epithelialization: None Tunneling: No Undermining: No Wound Description Classification: Full Thickness Without Exposed Support Structures Exudate Amount: Large Exudate Type: Serosanguineous Exudate Color: red, brown Foul Odor After Cleansing: No Slough/Fibrino Yes Wound Bed Granulation Amount: Large (67-100%) Exposed Structure Granulation Quality: Red Fascia Exposed: No Necrotic Amount: Small (1-33%) Fat Layer (Subcutaneous Tissue) Exposed: Yes Necrotic Quality: Adherent Slough Tendon Exposed: No Muscle Exposed: No Joint Exposed: No Bone Exposed: No Treatment Notes Wound #11 (Lower Leg) Wound Laterality: Right Cleanser Wound Cleanser Discharge Instruction: Wash your hands with soap and water. Remove old dressing, discard into plastic bag and place into trash. Cleanse the wound with Wound Cleanser prior to applying a clean dressing using gauze sponges, not tissues or cotton balls. Do not scrub or use excessive force. Pat dry using gauze sponges, not tissue or cotton balls. [name], [name] ([id number]) Peri-Wound Care Triamcinolone Acetonide Cream, 0.1%, 15 (g) tube Discharge Instruction: Apply as directed. TO RED AREAS RLL Topical Gentamicin Discharge Instruction: IN OFFICE ONLY MIXED WITH SANTYL-Apply as directed by Dilcia Rybarczyk. in office only Santyl  Collagenase Ointment, 30 (gm), tube Discharge Instruction: IN OFFICE ONLY MIXED WITH GENT-apply nickel thick to wound bed only. IN office only Primary Dressing Hydrofera Blue Ready Transfer Foam, 4x5 (in/in) Discharge Instruction: Apply Hydrofera Blue Ready to wound bed as directed Secondary Dressing ABD Pad 5x9 (in/in) Discharge Instruction: PAD SHIN UNDER UNNA WRAP AND OVER WOUND--Cover with ABD pad cut in half length wise to place on shin prior to placing unna boot to protect skin when removing Secured With Compression Wrap Unna Boot 4x10 (in/yd) Discharge Instruction: Alger Simons, Kerlix and Coban from base of toes to three finger widths below bend in knee. Compression Stockings Add-Ons Electronic Signature(s) Signed: 08/24/2021 3:05:53 PM By: Donnamarie Poag Entered By: Donnamarie Poag on 08/24/2021 14:32:10 [name] ([id number]) -------------------------------------------------------------------------------- [place] Details Patient Name: [name]. Date of Service: 08/24/2021 2:30 PM Medical Record Number: [id number] Patient Account Number: [id number] Date of Birth/Sex: Nov 19, 1950 (71 y.o. F) Treating RN: Donnamarie Poag Primary Care Dawaun Brancato: Rutherford Guys Other Clinician: Referring Tristyn Demarest: Rutherford Guys Treating Durwood Dittus/Extender: Yaakov Guthrie in Treatment: 21 Vital Signs Time Taken: 14:25 Temperature (F): 98.1 Height (in): 66 Pulse (bpm): 98 Weight (lbs): 332 Respiratory Rate (breaths/min): 20 Body Mass Index (BMI): 53.6 Blood Pressure (mmHg): 149/76 Reference Range: 80 - 120 mg / dl Electronic Signature(s) Signed: 08/24/2021 3:05:53 PM By: Donnamarie Poag Entered ByDonnamarie Poag on 08/24/2021 14:28:35

## 2021-08-25 ENCOUNTER — Emergency Department: Payer: Medicare Other

## 2021-08-25 ENCOUNTER — Inpatient Hospital Stay: Payer: Medicare Other

## 2021-08-25 ENCOUNTER — Inpatient Hospital Stay: Payer: Medicare Other | Admitting: Anesthesiology

## 2021-08-25 ENCOUNTER — Inpatient Hospital Stay
Admission: EM | Admit: 2021-08-25 | Discharge: 2021-09-06 | DRG: 853 | Disposition: A | Discharge status: Skilled Nursing Facility | Payer: Medicare Other | Attending: Internal Medicine | Admitting: Internal Medicine

## 2021-08-25 ENCOUNTER — Encounter
Admission: EM | Disposition: A | Discharge status: Skilled Nursing Facility | Payer: Self-pay | Source: Home / Self Care | Attending: Pulmonary Disease

## 2021-08-25 ENCOUNTER — Other Ambulatory Visit: Payer: Self-pay

## 2021-08-25 DIAGNOSIS — R34 Anuria and oliguria: Secondary | ICD-10-CM | POA: Diagnosis not present

## 2021-08-25 DIAGNOSIS — Z79899 Other long term (current) drug therapy: Secondary | ICD-10-CM

## 2021-08-25 DIAGNOSIS — N139 Obstructive and reflux uropathy, unspecified: Secondary | ICD-10-CM | POA: Diagnosis not present

## 2021-08-25 DIAGNOSIS — E8779 Other fluid overload: Secondary | ICD-10-CM | POA: Diagnosis not present

## 2021-08-25 DIAGNOSIS — Z6841 Body Mass Index (BMI) 40.0 and over, adult: Secondary | ICD-10-CM

## 2021-08-25 DIAGNOSIS — Z789 Other specified health status: Secondary | ICD-10-CM

## 2021-08-25 DIAGNOSIS — I5022 Chronic systolic (congestive) heart failure: Secondary | ICD-10-CM | POA: Diagnosis present

## 2021-08-25 DIAGNOSIS — R1310 Dysphagia, unspecified: Secondary | ICD-10-CM

## 2021-08-25 DIAGNOSIS — I442 Atrioventricular block, complete: Secondary | ICD-10-CM | POA: Diagnosis present

## 2021-08-25 DIAGNOSIS — R001 Bradycardia, unspecified: Secondary | ICD-10-CM | POA: Diagnosis not present

## 2021-08-25 DIAGNOSIS — E662 Morbid (severe) obesity with alveolar hypoventilation: Secondary | ICD-10-CM | POA: Diagnosis present

## 2021-08-25 DIAGNOSIS — N184 Chronic kidney disease, stage 4 (severe): Secondary | ICD-10-CM | POA: Diagnosis present

## 2021-08-25 DIAGNOSIS — I350 Nonrheumatic aortic (valve) stenosis: Secondary | ICD-10-CM

## 2021-08-25 DIAGNOSIS — J9601 Acute respiratory failure with hypoxia: Secondary | ICD-10-CM | POA: Diagnosis not present

## 2021-08-25 DIAGNOSIS — B9620 Unspecified Escherichia coli [E. coli] as the cause of diseases classified elsewhere: Secondary | ICD-10-CM | POA: Diagnosis not present

## 2021-08-25 DIAGNOSIS — I480 Paroxysmal atrial fibrillation: Secondary | ICD-10-CM | POA: Diagnosis not present

## 2021-08-25 DIAGNOSIS — N132 Hydronephrosis with renal and ureteral calculous obstruction: Secondary | ICD-10-CM | POA: Diagnosis not present

## 2021-08-25 DIAGNOSIS — E785 Hyperlipidemia, unspecified: Secondary | ICD-10-CM | POA: Diagnosis present

## 2021-08-25 DIAGNOSIS — N390 Urinary tract infection, site not specified: Secondary | ICD-10-CM

## 2021-08-25 DIAGNOSIS — A4151 Sepsis due to Escherichia coli [E. coli]: Secondary | ICD-10-CM | POA: Diagnosis present

## 2021-08-25 DIAGNOSIS — Z952 Presence of prosthetic heart valve: Secondary | ICD-10-CM

## 2021-08-25 DIAGNOSIS — E119 Type 2 diabetes mellitus without complications: Secondary | ICD-10-CM | POA: Diagnosis not present

## 2021-08-25 DIAGNOSIS — J189 Pneumonia, unspecified organism: Secondary | ICD-10-CM | POA: Diagnosis present

## 2021-08-25 DIAGNOSIS — Z794 Long term (current) use of insulin: Secondary | ICD-10-CM

## 2021-08-25 DIAGNOSIS — N12 Tubulo-interstitial nephritis, not specified as acute or chronic: Secondary | ICD-10-CM | POA: Diagnosis not present

## 2021-08-25 DIAGNOSIS — D6959 Other secondary thrombocytopenia: Secondary | ICD-10-CM | POA: Diagnosis present

## 2021-08-25 DIAGNOSIS — R1312 Dysphagia, oropharyngeal phase: Secondary | ICD-10-CM | POA: Diagnosis present

## 2021-08-25 DIAGNOSIS — Z01818 Encounter for other preprocedural examination: Secondary | ICD-10-CM

## 2021-08-25 DIAGNOSIS — R7881 Bacteremia: Secondary | ICD-10-CM | POA: Diagnosis not present

## 2021-08-25 DIAGNOSIS — Z7984 Long term (current) use of oral hypoglycemic drugs: Secondary | ICD-10-CM

## 2021-08-25 DIAGNOSIS — R6521 Severe sepsis with septic shock: Secondary | ICD-10-CM | POA: Diagnosis present

## 2021-08-25 DIAGNOSIS — A419 Sepsis, unspecified organism: Secondary | ICD-10-CM | POA: Diagnosis present

## 2021-08-25 DIAGNOSIS — N201 Calculus of ureter: Secondary | ICD-10-CM | POA: Diagnosis not present

## 2021-08-25 DIAGNOSIS — Z20822 Contact with and (suspected) exposure to covid-19: Secondary | ICD-10-CM | POA: Diagnosis present

## 2021-08-25 DIAGNOSIS — I130 Hypertensive heart and chronic kidney disease with heart failure and stage 1 through stage 4 chronic kidney disease, or unspecified chronic kidney disease: Secondary | ICD-10-CM | POA: Diagnosis present

## 2021-08-25 DIAGNOSIS — N1330 Unspecified hydronephrosis: Secondary | ICD-10-CM | POA: Diagnosis not present

## 2021-08-25 DIAGNOSIS — G9341 Metabolic encephalopathy: Secondary | ICD-10-CM | POA: Diagnosis present

## 2021-08-25 DIAGNOSIS — N2581 Secondary hyperparathyroidism of renal origin: Secondary | ICD-10-CM | POA: Diagnosis present

## 2021-08-25 DIAGNOSIS — Z8249 Family history of ischemic heart disease and other diseases of the circulatory system: Secondary | ICD-10-CM

## 2021-08-25 DIAGNOSIS — N1832 Chronic kidney disease, stage 3b: Secondary | ICD-10-CM | POA: Diagnosis present

## 2021-08-25 DIAGNOSIS — N136 Pyonephrosis: Secondary | ICD-10-CM | POA: Diagnosis present

## 2021-08-25 DIAGNOSIS — Z887 Allergy status to serum and vaccine status: Secondary | ICD-10-CM

## 2021-08-25 DIAGNOSIS — G4733 Obstructive sleep apnea (adult) (pediatric): Secondary | ICD-10-CM | POA: Diagnosis present

## 2021-08-25 DIAGNOSIS — E875 Hyperkalemia: Secondary | ICD-10-CM | POA: Diagnosis not present

## 2021-08-25 DIAGNOSIS — E1122 Type 2 diabetes mellitus with diabetic chronic kidney disease: Secondary | ICD-10-CM | POA: Diagnosis present

## 2021-08-25 DIAGNOSIS — I248 Other forms of acute ischemic heart disease: Secondary | ICD-10-CM | POA: Diagnosis present

## 2021-08-25 DIAGNOSIS — Z452 Encounter for adjustment and management of vascular access device: Secondary | ICD-10-CM

## 2021-08-25 DIAGNOSIS — N23 Unspecified renal colic: Secondary | ICD-10-CM

## 2021-08-25 DIAGNOSIS — Z888 Allergy status to other drugs, medicaments and biological substances status: Secondary | ICD-10-CM

## 2021-08-25 DIAGNOSIS — J9602 Acute respiratory failure with hypercapnia: Secondary | ICD-10-CM | POA: Diagnosis not present

## 2021-08-25 DIAGNOSIS — E870 Hyperosmolality and hypernatremia: Secondary | ICD-10-CM | POA: Diagnosis present

## 2021-08-25 DIAGNOSIS — I10 Essential (primary) hypertension: Secondary | ICD-10-CM | POA: Diagnosis present

## 2021-08-25 DIAGNOSIS — N179 Acute kidney failure, unspecified: Secondary | ICD-10-CM | POA: Diagnosis present

## 2021-08-25 DIAGNOSIS — N170 Acute kidney failure with tubular necrosis: Secondary | ICD-10-CM | POA: Diagnosis present

## 2021-08-25 DIAGNOSIS — Z950 Presence of cardiac pacemaker: Secondary | ICD-10-CM

## 2021-08-25 DIAGNOSIS — Z7901 Long term (current) use of anticoagulants: Secondary | ICD-10-CM

## 2021-08-25 DIAGNOSIS — E876 Hypokalemia: Secondary | ICD-10-CM | POA: Diagnosis present

## 2021-08-25 DIAGNOSIS — J9600 Acute respiratory failure, unspecified whether with hypoxia or hypercapnia: Secondary | ICD-10-CM

## 2021-08-25 DIAGNOSIS — Z4659 Encounter for fitting and adjustment of other gastrointestinal appliance and device: Secondary | ICD-10-CM

## 2021-08-25 DIAGNOSIS — R319 Hematuria, unspecified: Secondary | ICD-10-CM | POA: Diagnosis not present

## 2021-08-25 DIAGNOSIS — I2510 Atherosclerotic heart disease of native coronary artery without angina pectoris: Secondary | ICD-10-CM | POA: Diagnosis present

## 2021-08-25 DIAGNOSIS — I252 Old myocardial infarction: Secondary | ICD-10-CM

## 2021-08-25 HISTORY — PX: CYSTOSCOPY W/ URETERAL STENT PLACEMENT: SHX1429

## 2021-08-25 LAB — COMPREHENSIVE METABOLIC PANEL
ALT: 36 U/L (ref 0–44)
AST: 72 U/L — ABNORMAL HIGH (ref 15–41)
Albumin: 2.9 g/dL — ABNORMAL LOW (ref 3.5–5.0)
Alkaline Phosphatase: 45 U/L (ref 38–126)
Anion gap: 10 (ref 5–15)
BUN: 46 mg/dL — ABNORMAL HIGH (ref 8–23)
CO2: 22 mmol/L (ref 22–32)
Calcium: 8.5 mg/dL — ABNORMAL LOW (ref 8.9–10.3)
Chloride: 108 mmol/L (ref 98–111)
Creatinine, Ser: 2.31 mg/dL — ABNORMAL HIGH (ref 0.44–1.00)
GFR, Estimated: 22 mL/min — ABNORMAL LOW (ref >60)
Glucose, Bld: 182 mg/dL — ABNORMAL HIGH (ref 70–99)
Potassium: 3.4 mmol/L — ABNORMAL LOW (ref 3.5–5.1)
Sodium: 140 mmol/L (ref 135–145)
Total Bilirubin: 0.9 mg/dL (ref 0.3–1.2)
Total Protein: 5.7 g/dL — ABNORMAL LOW (ref 6.5–8.1)

## 2021-08-25 LAB — BASIC METABOLIC PANEL
Anion gap: 15 (ref 5–15)
BUN: 50 mg/dL — ABNORMAL HIGH (ref 8–23)
CO2: 19 mmol/L — ABNORMAL LOW (ref 22–32)
Calcium: 8.6 mg/dL — ABNORMAL LOW (ref 8.9–10.3)
Chloride: 109 mmol/L (ref 98–111)
Creatinine, Ser: 3.12 mg/dL — ABNORMAL HIGH (ref 0.44–1.00)
GFR, Estimated: 15 mL/min — ABNORMAL LOW (ref >60)
Glucose, Bld: 183 mg/dL — ABNORMAL HIGH (ref 70–99)
Potassium: 3.5 mmol/L (ref 3.5–5.1)
Sodium: 143 mmol/L (ref 135–145)

## 2021-08-25 LAB — BLOOD CULTURE ID PANEL (REFLEXED) - BCID2

## 2021-08-25 LAB — CBC
HCT: 37.5 % (ref 36.0–46.0)
HCT: 39.3 % (ref 36.0–46.0)
Hemoglobin: 11.8 g/dL — ABNORMAL LOW (ref 12.0–15.0)
Hemoglobin: 12.3 g/dL (ref 12.0–15.0)
MCH: 29.1 pg (ref 26.0–34.0)
MCH: 29.6 pg (ref 26.0–34.0)
MCHC: 31.5 g/dL (ref 30.0–36.0)
MCHC: 31.3 g/dL (ref 30.0–36.0)
MCV: 92.4 fL (ref 80.0–100.0)
MCV: 94.5 fL (ref 80.0–100.0)
Platelets: 120 10*3/uL — ABNORMAL LOW (ref 150–400)
Platelets: 128 10*3/uL — ABNORMAL LOW (ref 150–400)
RBC: 4.06 MIL/uL (ref 3.87–5.11)
RBC: 4.16 MIL/uL (ref 3.87–5.11)
RDW: 14.3 % (ref 11.5–15.5)
RDW: 14.9 % (ref 11.5–15.5)
WBC: 8.9 10*3/uL (ref 4.0–10.5)
WBC: 21.3 10*3/uL — ABNORMAL HIGH (ref 4.0–10.5)
nRBC: 0.2 % (ref 0.0–0.2)
nRBC: 0 % (ref 0.0–0.2)

## 2021-08-25 LAB — URINALYSIS, COMPLETE (UACMP) WITH MICROSCOPIC
Bilirubin Urine: NEGATIVE
Glucose, UA: NEGATIVE mg/dL
Ketones, ur: NEGATIVE mg/dL
Nitrite: NEGATIVE
Protein, ur: 100 mg/dL — AB
RBC / HPF: >50 RBC/hpf — ABNORMAL HIGH (ref 0–5)
Specific Gravity, Urine: 1.014 (ref 1.005–1.030)
WBC, UA: >50 WBC/hpf — ABNORMAL HIGH (ref 0–5)
pH: 5 (ref 5.0–8.0)

## 2021-08-25 LAB — BLOOD GAS, ARTERIAL
Acid-base deficit: 2.1 mmol/L — ABNORMAL HIGH (ref 0.0–2.0)
Bicarbonate: 21.1 mmol/L (ref 20.0–28.0)
FIO2: 21 %
O2 Saturation: 91.4 %
Patient temperature: 37
pCO2 arterial: 31 mmHg — ABNORMAL LOW (ref 32–48)
pH, Arterial: 7.44 (ref 7.35–7.45)
pO2, Arterial: 60 mmHg — ABNORMAL LOW (ref 83–108)

## 2021-08-25 LAB — PROTIME-INR
INR: 1.6 — ABNORMAL HIGH (ref 0.8–1.2)
Prothrombin Time: 18.5 seconds — ABNORMAL HIGH (ref 11.4–15.2)

## 2021-08-25 LAB — GLUCOSE, CAPILLARY
Glucose-Capillary: 166 mg/dL — ABNORMAL HIGH (ref 70–99)
Glucose-Capillary: 191 mg/dL — ABNORMAL HIGH (ref 70–99)
Glucose-Capillary: 201 mg/dL — ABNORMAL HIGH (ref 70–99)

## 2021-08-25 LAB — PROCALCITONIN: Procalcitonin: 124.35 ng/mL

## 2021-08-25 LAB — LACTIC ACID, PLASMA
Lactic Acid, Venous: 4.3 mmol/L (ref 0.5–1.9)
Lactic Acid, Venous: 2.8 mmol/L (ref 0.5–1.9)

## 2021-08-25 LAB — BLOOD GAS, VENOUS
Acid-base deficit: 8 mmol/L — ABNORMAL HIGH (ref 0.0–2.0)
Bicarbonate: 20.6 mmol/L (ref 20.0–28.0)
FIO2: 80 %
MECHVT: 460 mL
O2 Saturation: 74.2 %
PEEP: 5 cmH2O
Patient temperature: 37
RATE: 16 resp/min
pCO2, Ven: 54 mmHg (ref 44–60)
pH, Ven: 7.19 — CL (ref 7.25–7.43)
pO2, Ven: 48 mmHg — ABNORMAL HIGH (ref 32–45)

## 2021-08-25 LAB — CBG MONITORING, ED
Glucose-Capillary: 167 mg/dL — ABNORMAL HIGH (ref 70–99)
Glucose-Capillary: 173 mg/dL — ABNORMAL HIGH (ref 70–99)

## 2021-08-25 LAB — MRSA NEXT GEN BY PCR, NASAL: MRSA by PCR Next Gen: NOT DETECTED

## 2021-08-25 LAB — APTT: aPTT: 30 seconds (ref 24–36)

## 2021-08-25 LAB — RESP PANEL BY RT-PCR (FLU A&B, COVID) ARPGX2
Influenza A by PCR: NEGATIVE
Influenza B by PCR: NEGATIVE
SARS Coronavirus 2 by RT PCR: NEGATIVE

## 2021-08-25 LAB — TROPONIN I (HIGH SENSITIVITY)
Troponin I (High Sensitivity): 420 ng/L (ref <18)
Troponin I (High Sensitivity): 403 ng/L (ref <18)

## 2021-08-25 SURGERY — CYSTOSCOPY, WITH RETROGRADE PYELOGRAM AND URETERAL STENT INSERTION
Anesthesia: General

## 2021-08-25 MED ORDER — FENTANYL CITRATE (PF) 100 MCG/2ML IJ SOLN
INTRAMUSCULAR | Status: DC | PRN
  Administered 2021-08-25 (×2): 25 ug via INTRAVENOUS

## 2021-08-25 MED ORDER — FENTANYL BOLUS VIA INFUSION
25.0000 ug | INTRAVENOUS | Status: DC | PRN
  Administered 2021-08-25 – 2021-08-27 (×4): 50 ug via INTRAVENOUS
  Administered 2021-08-28: 100 ug via INTRAVENOUS
  Filled 2021-08-25: qty 100

## 2021-08-25 MED ORDER — LACTATED RINGERS IV SOLN: INTRAVENOUS | Status: DC

## 2021-08-25 MED ORDER — SUCCINYLCHOLINE CHLORIDE 200 MG/10ML IV SOSY
PREFILLED_SYRINGE | INTRAVENOUS | Status: DC | PRN
Start: 2021-08-25 — End: 2021-08-25
  Administered 2021-08-25: 120 mg via INTRAVENOUS

## 2021-08-25 MED ORDER — VANCOMYCIN HCL 2000 MG/400ML IV SOLN
2000.0000 mg | Freq: Once | INTRAVENOUS | Status: DC
  Filled 2021-08-25: qty 400

## 2021-08-25 MED ORDER — ACETAMINOPHEN 325 MG PO TABS
650.0000 mg | ORAL_TABLET | Freq: Four times a day (QID) | ORAL | Status: DC | PRN
  Administered 2021-08-26 – 2021-09-06 (×2): 650 mg via ORAL
  Filled 2021-08-25 (×2): qty 2

## 2021-08-25 MED ORDER — APIXABAN 5 MG PO TABS: 5.0000 mg | ORAL_TABLET | Freq: Two times a day (BID) | ORAL | Status: DC

## 2021-08-25 MED ORDER — CHLORHEXIDINE GLUCONATE 0.12% ORAL RINSE (MEDLINE KIT)
15.0000 mL | Freq: Two times a day (BID) | OROMUCOSAL | Status: DC
  Administered 2021-08-25 – 2021-09-06 (×23): 15 mL via OROMUCOSAL
  Filled 2021-08-25: qty 15

## 2021-08-25 MED ORDER — MIDAZOLAM HCL 2 MG/2ML IJ SOLN
INTRAMUSCULAR | Status: AC
  Filled 2021-08-25: qty 2

## 2021-08-25 MED ORDER — VANCOMYCIN VARIABLE DOSE PER UNSTABLE RENAL FUNCTION (PHARMACIST DOSING): Status: DC

## 2021-08-25 MED ORDER — METRONIDAZOLE 500 MG/100ML IV SOLN: 500.0000 mg | Freq: Once | INTRAVENOUS | Status: DC

## 2021-08-25 MED ORDER — ROCURONIUM BROMIDE 100 MG/10ML IV SOLN
INTRAVENOUS | Status: DC | PRN
  Administered 2021-08-25: 30 mg via INTRAVENOUS
  Administered 2021-08-25: 20 mg via INTRAVENOUS

## 2021-08-25 MED ORDER — POLYETHYLENE GLYCOL 3350 17 G PO PACK
17.0000 g | PACK | Freq: Every day | ORAL | Status: DC
  Administered 2021-08-26 – 2021-08-29 (×3): 17 g
  Filled 2021-08-25 (×3): qty 1

## 2021-08-25 MED ORDER — IPRATROPIUM-ALBUTEROL 0.5-2.5 (3) MG/3ML IN SOLN: 3.0000 mL | RESPIRATORY_TRACT | Status: DC | PRN

## 2021-08-25 MED ORDER — INSULIN GLARGINE-YFGN 100 UNIT/ML ~~LOC~~ SOLN
40.0000 [IU] | Freq: Every day | SUBCUTANEOUS | Status: DC
  Filled 2021-08-25: qty 0.4

## 2021-08-25 MED ORDER — MIDAZOLAM-SODIUM CHLORIDE 100-0.9 MG/100ML-% IV SOLN
0.0000 mg/h | INTRAVENOUS | Status: DC
  Administered 2021-08-25: 2 mg/h via INTRAVENOUS
  Administered 2021-08-27: 3 mg/h via INTRAVENOUS
  Filled 2021-08-25 (×2): qty 100

## 2021-08-25 MED ORDER — PROPOFOL 1000 MG/100ML IV EMUL: 5.0000 ug/kg/min | INTRAVENOUS | Status: DC

## 2021-08-25 MED ORDER — SODIUM CHLORIDE 0.9 % IV SOLN: 2.0000 g | Freq: Once | INTRAVENOUS | Status: DC

## 2021-08-25 MED ORDER — PROPOFOL 10 MG/ML IV BOLUS
INTRAVENOUS | Status: AC
  Filled 2021-08-25: qty 20

## 2021-08-25 MED ORDER — VANCOMYCIN HCL IN DEXTROSE 1-5 GM/200ML-% IV SOLN: 1000.0000 mg | Freq: Once | INTRAVENOUS | Status: DC

## 2021-08-25 MED ORDER — NOREPINEPHRINE 4 MG/250ML-% IV SOLN
0.0000 ug/min | INTRAVENOUS | Status: DC
  Administered 2021-08-25: 7 ug/min via INTRAVENOUS
  Administered 2021-08-25: 24 ug/min via INTRAVENOUS
  Administered 2021-08-26: 23 ug/min via INTRAVENOUS
  Administered 2021-08-26: 15 ug/min via INTRAVENOUS
  Filled 2021-08-25 (×3): qty 250

## 2021-08-25 MED ORDER — PHENYLEPHRINE 40 MCG/ML (10ML) SYRINGE FOR IV PUSH (FOR BLOOD PRESSURE SUPPORT)
PREFILLED_SYRINGE | INTRAVENOUS | Status: DC | PRN
  Administered 2021-08-25: 80 ug via INTRAVENOUS
  Administered 2021-08-25 (×7): 160 ug via INTRAVENOUS

## 2021-08-25 MED ORDER — LIDOCAINE HCL (CARDIAC) PF 100 MG/5ML IV SOSY
PREFILLED_SYRINGE | INTRAVENOUS | Status: DC | PRN
  Administered 2021-08-25: 100 mg via INTRAVENOUS

## 2021-08-25 MED ORDER — LACTATED RINGERS IV BOLUS
1000.0000 mL | Freq: Once | INTRAVENOUS | Status: AC
  Administered 2021-08-25: 1000 mL via INTRAVENOUS

## 2021-08-25 MED ORDER — SODIUM CHLORIDE 0.9 % IV SOLN
1.0000 g | Freq: Once | INTRAVENOUS | Status: AC
  Administered 2021-08-25: 1 g via INTRAVENOUS
  Filled 2021-08-25: qty 1

## 2021-08-25 MED ORDER — INSULIN ASPART 100 UNIT/ML IJ SOLN
0.0000 [IU] | INTRAMUSCULAR | Status: DC
  Administered 2021-08-26: 7 [IU] via SUBCUTANEOUS
  Administered 2021-08-26: 4 [IU] via SUBCUTANEOUS
  Administered 2021-08-26: 05:00:00 7 [IU] via SUBCUTANEOUS
  Administered 2021-08-26: 16:00:00 11 [IU] via SUBCUTANEOUS
  Administered 2021-08-26: 12:00:00 7 [IU] via SUBCUTANEOUS
  Administered 2021-08-26: 4 [IU] via SUBCUTANEOUS
  Administered 2021-08-26: 08:00:00 7 [IU] via SUBCUTANEOUS
  Administered 2021-08-27 (×2): 4 [IU] via SUBCUTANEOUS
  Administered 2021-08-27: 7 [IU] via SUBCUTANEOUS
  Administered 2021-08-27 (×2): 4 [IU] via SUBCUTANEOUS
  Administered 2021-08-28: 04:00:00 7 [IU] via SUBCUTANEOUS
  Administered 2021-08-28 – 2021-08-29 (×7): 4 [IU] via SUBCUTANEOUS
  Administered 2021-08-29: 7 [IU] via SUBCUTANEOUS
  Administered 2021-08-29 – 2021-08-30 (×5): 4 [IU] via SUBCUTANEOUS
  Administered 2021-08-30: 7 [IU] via SUBCUTANEOUS
  Administered 2021-08-30 (×2): 4 [IU] via SUBCUTANEOUS
  Administered 2021-08-30: 17:00:00 7 [IU] via SUBCUTANEOUS
  Administered 2021-08-31 (×3): 4 [IU] via SUBCUTANEOUS
  Administered 2021-08-31 (×2): 3 [IU] via SUBCUTANEOUS
  Administered 2021-08-31 – 2021-09-01 (×2): 4 [IU] via SUBCUTANEOUS
  Administered 2021-09-01: 20:00:00 3 [IU] via SUBCUTANEOUS
  Administered 2021-09-01: 08:00:00 4 [IU] via SUBCUTANEOUS
  Administered 2021-09-02: 21:00:00 20 [IU] via SUBCUTANEOUS
  Administered 2021-09-02: 3 [IU] via SUBCUTANEOUS
  Administered 2021-09-03 (×3): 4 [IU] via SUBCUTANEOUS
  Administered 2021-09-03 – 2021-09-04 (×2): 3 [IU] via SUBCUTANEOUS
  Administered 2021-09-04: 7 [IU] via SUBCUTANEOUS
  Administered 2021-09-04: 4 [IU] via SUBCUTANEOUS
  Administered 2021-09-04: 7 [IU] via SUBCUTANEOUS
  Administered 2021-09-04: 4 [IU] via SUBCUTANEOUS
  Administered 2021-09-04 – 2021-09-05 (×2): 3 [IU] via SUBCUTANEOUS
  Administered 2021-09-05 (×2): 4 [IU] via SUBCUTANEOUS
  Administered 2021-09-05 (×2): 3 [IU] via SUBCUTANEOUS
  Administered 2021-09-05: 7 [IU] via SUBCUTANEOUS
  Administered 2021-09-06 (×2): 4 [IU] via SUBCUTANEOUS
  Filled 2021-08-25 (×57): qty 1

## 2021-08-25 MED ORDER — EPHEDRINE SULFATE (PRESSORS) 50 MG/ML IJ SOLN
INTRAMUSCULAR | Status: DC | PRN
  Administered 2021-08-25: 10 mg via INTRAVENOUS

## 2021-08-25 MED ORDER — PANTOPRAZOLE SODIUM 40 MG IV SOLR
40.0000 mg | Freq: Every day | INTRAVENOUS | Status: DC
  Administered 2021-08-25 – 2021-09-03 (×10): 40 mg via INTRAVENOUS
  Filled 2021-08-25 (×10): qty 10

## 2021-08-25 MED ORDER — ACETAMINOPHEN 10 MG/ML IV SOLN
INTRAVENOUS | Status: AC
  Filled 2021-08-25: qty 100

## 2021-08-25 MED ORDER — MIDAZOLAM HCL 2 MG/2ML IJ SOLN
INTRAMUSCULAR | Status: DC | PRN
  Administered 2021-08-25: 2 mg via INTRAVENOUS

## 2021-08-25 MED ORDER — CHLORHEXIDINE GLUCONATE CLOTH 2 % EX PADS
6.0000 | MEDICATED_PAD | Freq: Every day | CUTANEOUS | Status: DC
  Administered 2021-08-25 – 2021-09-05 (×12): 6 via TOPICAL

## 2021-08-25 MED ORDER — PHENYLEPHRINE HCL-NACL 20-0.9 MG/250ML-% IV SOLN
INTRAVENOUS | Status: DC | PRN
  Administered 2021-08-25: 40 ug/min via INTRAVENOUS

## 2021-08-25 MED ORDER — ATORVASTATIN CALCIUM 20 MG PO TABS
40.0000 mg | ORAL_TABLET | Freq: Every day | ORAL | Status: DC
Start: 2021-08-25 — End: 2021-08-26

## 2021-08-25 MED ORDER — SODIUM CHLORIDE 0.9 % IV SOLN: 2.0000 g | Freq: Two times a day (BID) | INTRAVENOUS | Status: DC

## 2021-08-25 MED ORDER — LACTATED RINGERS IV BOLUS (SEPSIS)
500.0000 mL | Freq: Once | INTRAVENOUS | Status: AC
  Administered 2021-08-25: 500 mL via INTRAVENOUS

## 2021-08-25 MED ORDER — MIDAZOLAM HCL 2 MG/2ML IJ SOLN
1.0000 mg | INTRAMUSCULAR | Status: DC | PRN
  Administered 2021-08-25: 1 mg via INTRAVENOUS

## 2021-08-25 MED ORDER — SODIUM CHLORIDE 0.9 % IV SOLN: 2.0000 g | INTRAVENOUS | Status: DC

## 2021-08-25 MED ORDER — LACTATED RINGERS IV SOLN: INTRAVENOUS | Status: DC | PRN

## 2021-08-25 MED ORDER — DOCUSATE SODIUM 50 MG/5ML PO LIQD
100.0000 mg | Freq: Two times a day (BID) | ORAL | Status: DC
  Administered 2021-08-26 – 2021-08-29 (×6): 100 mg
  Filled 2021-08-25 (×7): qty 10

## 2021-08-25 MED ORDER — ORAL CARE MOUTH RINSE
15.0000 mL | OROMUCOSAL | Status: DC
  Administered 2021-08-25 – 2021-09-01 (×63): 15 mL via OROMUCOSAL

## 2021-08-25 MED ORDER — FENOFIBRATE 160 MG PO TABS
160.0000 mg | ORAL_TABLET | Freq: Every day | ORAL | Status: DC
Start: 2021-08-26 — End: 2021-08-26
  Filled 2021-08-25: qty 1

## 2021-08-25 MED ORDER — ACETAMINOPHEN 10 MG/ML IV SOLN
INTRAVENOUS | Status: DC | PRN
  Administered 2021-08-25: 1000 mg via INTRAVENOUS

## 2021-08-25 MED ORDER — MIDAZOLAM HCL 2 MG/2ML IJ SOLN
1.0000 mg | INTRAMUSCULAR | Status: DC | PRN
  Administered 2021-08-27 – 2021-08-29 (×5): 1 mg via INTRAVENOUS
  Filled 2021-08-25 (×6): qty 2

## 2021-08-25 MED ORDER — SODIUM CHLORIDE 0.9 % IV SOLN
2.0000 g | INTRAVENOUS | Status: DC
  Administered 2021-08-25 – 2021-08-29 (×5): 2 g via INTRAVENOUS
  Filled 2021-08-25: qty 20
  Filled 2021-08-25: qty 2
  Filled 2021-08-25: qty 20
  Filled 2021-08-25 (×3): qty 2

## 2021-08-25 MED ORDER — METRONIDAZOLE 500 MG/100ML IV SOLN: 500.0000 mg | Freq: Two times a day (BID) | INTRAVENOUS | Status: DC

## 2021-08-25 MED ORDER — FENTANYL CITRATE (PF) 100 MCG/2ML IJ SOLN
INTRAMUSCULAR | Status: AC
  Filled 2021-08-25: qty 2

## 2021-08-25 MED ORDER — FUROSEMIDE 10 MG/ML IJ SOLN: 20.0000 mg | Freq: Once | INTRAMUSCULAR | Status: DC

## 2021-08-25 MED ORDER — ONDANSETRON HCL 4 MG PO TABS: 4.0000 mg | ORAL_TABLET | Freq: Four times a day (QID) | ORAL | Status: DC | PRN

## 2021-08-25 MED ORDER — ONDANSETRON HCL 4 MG/2ML IJ SOLN: 4.0000 mg | Freq: Four times a day (QID) | INTRAMUSCULAR | Status: DC | PRN

## 2021-08-25 MED ORDER — FENTANYL 2500MCG IN NS 250ML (10MCG/ML) PREMIX INFUSION
25.0000 ug/h | INTRAVENOUS | Status: DC
  Administered 2021-08-25: 25 ug/h via INTRAVENOUS
  Administered 2021-08-26 (×2): 175 ug/h via INTRAVENOUS
  Administered 2021-08-27: 150 ug/h via INTRAVENOUS
  Administered 2021-08-28: 50 ug/h via INTRAVENOUS
  Filled 2021-08-25 (×5): qty 250

## 2021-08-25 MED ORDER — PROPOFOL 10 MG/ML IV BOLUS
INTRAVENOUS | Status: DC | PRN
  Administered 2021-08-25: 70 mg via INTRAVENOUS

## 2021-08-25 MED ORDER — VANCOMYCIN HCL 1500 MG/300ML IV SOLN
1500.0000 mg | Freq: Once | INTRAVENOUS | Status: DC
  Filled 2021-08-25: qty 300

## 2021-08-25 MED ORDER — ALBUMIN HUMAN 5 % IV SOLN
INTRAVENOUS | Status: AC
  Filled 2021-08-25: qty 250

## 2021-08-25 MED ORDER — ALBUMIN HUMAN 5 % IV SOLN: INTRAVENOUS | Status: DC | PRN

## 2021-08-25 MED ORDER — SODIUM CHLORIDE 0.9 % IV SOLN: 1.0000 g | Freq: Once | INTRAVENOUS | Status: DC

## 2021-08-25 MED ORDER — BASAGLAR KWIKPEN 100 UNIT/ML ~~LOC~~ SOPN: 70.0000 [IU] | PEN_INJECTOR | Freq: Every day | SUBCUTANEOUS | Status: DC

## 2021-08-25 SURGICAL SUPPLY — 24 items
BAG DRAIN CYSTO-URO LG1000N (MISCELLANEOUS) ×3 IMPLANT
BAG URINE DRAIN 2000ML AR STRL (UROLOGICAL SUPPLIES) ×2 IMPLANT
BRUSH SCRUB EZ 1% IODOPHOR (MISCELLANEOUS) IMPLANT
CATH FOL 2WAY LX 18X30 (CATHETERS) ×2 IMPLANT
CATH URETL OPEN 5X70 (CATHETERS) ×3 IMPLANT
GAUZE 4X4 16PLY ~~LOC~~+RFID DBL (SPONGE) ×6 IMPLANT
GLOVE SURG UNDER POLY LF SZ7.5 (GLOVE) ×3 IMPLANT
GOWN STRL REUS W/ TWL LRG LVL3 (GOWN DISPOSABLE) ×2 IMPLANT
GOWN STRL REUS W/ TWL XL LVL3 (GOWN DISPOSABLE) ×2 IMPLANT
GOWN STRL REUS W/TWL LRG LVL3 (GOWN DISPOSABLE) ×1
GOWN STRL REUS W/TWL XL LVL3 (GOWN DISPOSABLE) ×1
GUIDEWIRE STR DUAL SENSOR (WIRE) ×3 IMPLANT
IV NS IRRIG 3000ML ARTHROMATIC (IV SOLUTION) ×3 IMPLANT
KIT TURNOVER CYSTO (KITS) ×3 IMPLANT
MANIFOLD NEPTUNE II (INSTRUMENTS) ×3 IMPLANT
PACK CYSTO AR (MISCELLANEOUS) ×3 IMPLANT
SET CYSTO W/LG BORE CLAMP LF (SET/KITS/TRAYS/PACK) ×3 IMPLANT
STENT URET 6FRX24 CONTOUR (STENTS) IMPLANT
STENT URET 6FRX26 CONTOUR (STENTS) ×4 IMPLANT
SURGILUBE 2OZ TUBE FLIPTOP (MISCELLANEOUS) ×3 IMPLANT
SYR TOOMEY IRRIG 70ML (MISCELLANEOUS)
SYRINGE TOOMEY IRRIG 70ML (MISCELLANEOUS) IMPLANT
WATER STERILE IRR 1000ML POUR (IV SOLUTION) ×3 IMPLANT
WATER STERILE IRR 500ML POUR (IV SOLUTION) ×3 IMPLANT

## 2021-08-25 NOTE — Op Note (Addendum)
Date of procedure: 08/25/21 ? ?Preoperative diagnosis:  ?Right proximal ureteral stone ?Left UPJ stone ?Sepsis from urinary source ? ?Postoperative diagnosis:  ?Same ? ?Procedure: ?Cystoscopy ?Right ureteral stent placement ?Left ureteral stent placement ? ?Surgeon: Nickolas Madrid, MD ? ?Anesthesia: General ? ?Complications: None ? ?Intraoperative findings:  ?Normal cystoscopy, purulent urine in bladder ?Uncomplicated bilateral ureteral stent placement, purulent drainage from right kidney ? ?EBL: None ? ?Specimens: None ? ?Drains: Bilateral 6 French by 26 cm ureteral stents, 16 French Foley ? ?Indication: [name] is a 71 y.o. extremely comorbid patient with right proximal ureteral stone and hydronephrosis, left UPJ stone, and clinical picture consistent with sepsis from urinary source.  After reviewing the management options for treatment, they elected to proceed with the above surgical procedure(s). We have discussed the potential benefits and risks of the procedure, side effects of the proposed treatment, the likelihood of the patient achieving the goals of the procedure, and any potential problems that might occur during the procedure or recuperation. Informed consent has been obtained. ? ?Description of procedure: ? ?The patient was taken to the operating room and general anesthesia was induced. SCDs were placed for DVT prophylaxis. The patient was placed in the dorsal lithotomy position, prepped and draped in the usual sterile fashion, and preoperative antibiotics(cefepime in the ED) were administered. A preoperative time-out was performed.  ? ?A 21 French rigid cystoscope was used to intubate the urethra and thorough cystoscopy was performed.  The urine was purulent and was irrigated free, but the bladder was grossly normal, and the ureteral orifices orthotopic bilaterally. ? ?A sensor wire was advanced into the right ureteral orifice and advanced easily up to the projected location of the kidney,  purulent urine drained alongside the wire.  A 6 French by 26 cm ureteral stent was uneventfully advanced over the wire with a curl over the projected location of the renal pelvis, as well as under direct vision of the bladder. ? ?With the aid of an access catheter, I intubated the left ureteral orifice and a sensor wire was advanced up to the kidney under fluoroscopic vision.  A 6 French by 26 cm ureteral stent was uneventfully placed with a curl over the projected location of the kidney, as well as in the bladder. ? ?A 16 French Foley was placed to maximize drainage, and 10 mL placed in the balloon.  Urine was purulent. ? ?Disposition: Stable to PACU ? ?Plan: ?Remain intubated to ICU ?Agree with admission and broad-spectrum antibiotics, resuscitation ?Foley can be removed when clinically improved ?Will need follow-up bilateral ureteroscopy laser lithotripsy in 3 to 4 weeks ? ?Nickolas Madrid, MD ? ?

## 2021-08-25 NOTE — ED Notes (Signed)
This RN at bedside to reassess and pt was found to have dark colored liquid which appears to be dried blood lips, pt has had mask on during visit so this is unknown of when this presented. ED MD at bedside. Pt denies recently eating anything of this color or vomiting blood or coffee ground emesis.  ?

## 2021-08-25 NOTE — Progress Notes (Signed)
CODE SEPSIS - PHARMACY COMMUNICATION ? ?**Broad Spectrum Antibiotics should be administered within 1 hour of Sepsis diagnosis** ? ?Time Code Sepsis Called/Page Received: 8341 ? ?Antibiotics Ordered: cefepime, metronidazole, and vancomycin (orders placed at 1319) ? ?Time of 1st antibiotic administration: 1331 ? ?Additional action taken by pharmacy: spoke with Dr. Cinda Quest at 1200 regarding antibiotics (ordered then discontinued). He wants to hold off for now ? ?If necessary, Name of Provider/Nurse Contacted: Dr. Cinda Quest ? ? ?Sherilyn Banker ,PharmD ?Clinical Pharmacist  ?08/25/2021  11:49 AM ? ?

## 2021-08-25 NOTE — H&P (Addendum)
History and Physical    Patient: [name] ALP:[id number] DOB: 02-Sep-1950 DOA: 08/25/2021 DOS: the patient was seen and examined on 08/25/2021 PCP: Letta Median, MD  Patient coming from: Home  Chief Complaint:  Chief Complaint  Patient presents with   Altered Mental Status   Patient is unable to provide any history HPI: [name] is a 71 y.o. female with medical history significant for morbid obesity (BMI 57), obesity hypoventilation syndrome, diabetes mellitus, coronary artery disease, hypertension, obstructive sleep apnea who presents to the emergency room via EMS for evaluation of change in mental status. Patient is unable to provide any history and continues to moan out in pain asking for help. Home health had called 911 because patient was altered. Vital signs per EMS showed a Tmax of 98.1, tachycardia with heart rate of 124, respiratory rate 27, room air pulse oximetry of 86% (she was initially on 6 L of oxygen but has been weaned down to 2 L, blood pressure 90/70. In the ER she received 2.5 L of LR with improvement in her blood pressure. Labs show worsening of her renal function from baseline, she has a serum creatinine of 2.31 compared to 1.6 with a potassium of 3.4.  Lactic acid was 4.3, white count normal at 8.9. I am unable to do review of systems on this patient due to her mental status. She received a dose of IV vancomycin, cefepime and Flagyl.  Review of Systems: unable to review all systems due to the inability of the patient to answer questions. Past Medical History:  Diagnosis Date   Bacteremia    CAD (coronary artery disease)    Diabetes mellitus without complication (HCC)    Diastolic CHF ([place])    Hyperlipemia    Hypertension    Lymphedema    MI (myocardial infarction) ([place])    OSA (obstructive sleep apnea)    Severe aortic stenosis    Past Surgical History:  Procedure Laterality Date   CARDIAC SURGERY     none     PACEMAKER INSERTION      Social History:  reports that she has never smoked. She has never used smokeless tobacco. She reports that she does not drink alcohol and does not use drugs.  Allergies  Allergen Reactions   Tetanus Toxoid, Adsorbed Swelling   Tetanus Toxoid     Other reaction(s): Unknown   Tetanus Toxoids Swelling   Lisinopril Rash    Other reaction(s): Unknown   Niacin Rash    Other reaction(s): Unknown   Sitagliptin Rash    Other reaction(s): Unknown   Sulfamethoxazole-Trimethoprim Rash    Other reaction(s): Unknown    Family History  Problem Relation Age of Onset   Cancer Mother    Heart failure Father     Prior to Admission medications   Medication Sig Start Date End Date Taking? Authorizing Provider  acetaminophen (TYLENOL) 325 MG tablet Take 2 tablets (650 mg total) by mouth every 6 (six) hours as needed for mild pain (or Fever >/= 101). 11/05/17  Yes Wieting, Richard, MD  amLODipine (NORVASC) 5 MG tablet Take 5 mg by mouth daily. 08/09/21  Yes [provider]  apixaban (ELIQUIS) 5 MG TABS tablet Take 1 tablet by mouth 2 (two) times daily. 08/10/17  Yes [provider]  atorvastatin (LIPITOR) 40 MG tablet Take 1 tablet (40 mg total) by mouth daily at 6 PM. 05/09/16  Yes Epifanio Lesches, MD  cefpodoxime (VANTIN) 100 MG tablet Take 1 tablet (100  mg total) by mouth 2 (two) times daily. 11/29/20  Yes Billey Co, MD  Cholecalciferol 50 MCG (2000 UT) CAPS Take 1 capsule by mouth daily.   Yes [provider]  docusate sodium (COLACE) 100 MG capsule Take 1 capsule (100 mg total) by mouth daily. 11/05/17  Yes Wieting, Richard, MD  fenofibrate (TRICOR) 145 MG tablet Take 1 tablet by mouth daily. 04/17/16  Yes [provider]  furosemide (LASIX) 40 MG tablet Take 1 tablet (40 mg total) by mouth daily. 07/17/16  Yes Vaughan Basta, MD  insulin aspart (NOVOLOG FLEXPEN) 100 UNIT/ML FlexPen Inject 30 Units into the skin daily. 10/28/19  Yes [provider]  Insulin Glargine (BASAGLAR KWIKPEN) 100 UNIT/ML Inject 70 Units into the skin daily. 04/23/17  Yes [provider]  metFORMIN (GLUCOPHAGE) 1000 MG tablet Take 1 tablet (1,000 mg total) by mouth daily with breakfast. 05/22/17  Yes Max Sane, MD  metoprolol tartrate (LOPRESSOR) 25 MG tablet Take 1 tablet (25 mg total) by mouth 2 (two) times daily. 05/22/17  Yes Max Sane, MD  Potassium Chloride ER 20 MEQ TBCR Take 1 tablet by mouth daily. 02/09/20  Yes [provider]  amiodarone (PACERONE) 200 MG tablet Take 1 tablet (200 mg total) by mouth daily. Patient not taking: Reported on 08/25/2021 11/06/17   Loletha Grayer, MD  BD PEN NEEDLE NANO 2ND GEN 32G X 4 MM MISC 4 (four) times daily. use as directed 02/09/20   [provider]  tamsulosin (FLOMAX) 0.4 MG CAPS capsule Take 1 capsule (0.4 mg total) by mouth daily. Patient not taking: Reported on 08/25/2021 02/05/20   Billey Co, MD    Physical Exam: Vitals:   08/25/21 1300 08/25/21 1440 08/25/21 1500 08/25/21 1504  BP: 90/62 (!) 152/77 (!) 154/58 (!) 146/55  Pulse: (!) 116 70 (!) 58 (!) 59  Resp: (!) 27 (!) 25 (!) 25 (!) 25  Temp:      TempSrc:      SpO2: 96% 94% 92% 93%  Weight: (!) 154.7 kg     Height: 5\' 5"  (1.651 m)      Physical Exam Constitutional:      Appearance: She is obese.     Comments: Lethargic.  Oriented to person and place but not to time  HENT:     Head: Normocephalic and atraumatic.     Nose: Nose normal.     Comments: Nasal cannula in place    Mouth/Throat:     Mouth: Mucous membranes are moist.  Eyes:     Pupils: Pupils are equal, round, and reactive to light.  Cardiovascular:     Rate and Rhythm: Tachycardia present.     Pulses: Normal pulses.  Pulmonary:     Breath sounds: Normal breath sounds.     Comments: Tachypneic Abdominal:     General: Bowel sounds are normal.     Palpations: Abdomen is soft.     Comments: Central adiposity  Musculoskeletal:      Cervical back: Normal range of motion and neck supple.     Comments: Hypopigmented areas on both lower extremities  Skin:    General: Skin is warm and dry.  Neurological:     Comments: Lethargic.  Oriented only to person and place  Psychiatric:     Comments: Unable to assess     Data Reviewed: Relevant notes from primary care and specialist visits, past discharge summaries as available in EHR, including Care Everywhere. Prior diagnostic testing as pertinent  to current admission diagnoses Updated medications and problem lists for reconciliation ED course, including vitals, labs, imaging, treatment and response to treatment Triage notes, nursing and pharmacy notes and ED provider's notes Notable results as noted in HPI Labs reviewed.  Sodium 140, potassium 3.4, glucose 182, bicarb 22, BUN 46, creatinine 2.31 above a baseline of 1.6, calcium 8.5, PT 18.5, INR 1.6, white count 8.9, lactic acid 4.3 Chest x-ray reviewed by me possible right lower lobe infiltrate Twelve-lead EKG reviewed by me shows a ventricular paced rhythm There are no new results to review at this time.  Assessment and Plan: * Sepsis ([place]) As evidenced by low-grade temp with a Tmax of 99.2, hypotension that responded to IV fluid resuscitation, tachycardia and tachypnea. Patient noted to have a lactic acid level of 4.3 Most likely from a urinary source She received sepsis IV fluid bolus with improvement in her blood pressure and also received a dose of IV vancomycin, cefepime and Flagyl. We will place patient empirically on Rocephin 2 g IV daily Follow-up results of urine culture  Obstructive uropathy Patient noted to have obstructive uropathy with 2 6 mm stones in the proximal right ureter resulting in right-sided hydronephrosis. She also has left-sided hydronephrosis Urology consult has been placed and he plans to insert bilateral ureteral stents  Paroxysmal atrial fibrillation ([place]) Patient has a history of  paroxysmal A-fib Metoprolol is on hold due to hypotension Continue Eliquis as primary prophylaxis for an acute  AKI (acute kidney injury) ([place]) Most likely secondary to ATN from hypotension Hold furosemide, metoprolol and amlodipine Continue IV fluid resuscitation Repeat renal parameters in a.m.  Acute metabolic encephalopathy Most likely secondary to sepsis. Patient at baseline is usually awake, alert and oriented to person place and time She is only oriented to person and place at this time and is unable to provide any history. Expect improvement in patient's mental status following resolution of acute infection  Diabetes mellitus without complication ([place]) Patient with a history of diabetes mellitus We will continue long-acting insulin but will administer one half of her scheduled dose Blood sugar checks every 4 hours while n.p.o. Hold metformin  CAD (coronary artery disease) Stable Continue statins Hold metoprolol due to hypotension  Obstructive sleep apnea Patient most likely has underlying obstructive sleep apnea related to morbid obesity as well as possible obesity hypoventilation syndrome. Her pulse oximetry drops in the low 80s while asleep Continue oxygen supplementation and she will need to be assessed for home oxygen need prior to discharge  Hypertension Hold amlodipine, furosemide and metoprolol due to hypotension requiring IV fluid resuscitation  Morbid obesity ([place]) Patient has a BMI of 50.27 kg/m2 Complicates overall prognosis and care       Advance Care Planning:   Code Status: Full Code   Consults: None  Family Communication: Greater than 50% of time was spent discussing patient's condition and plan of care with her cousin and emergency contact [name] over the phone.  All questions and concerns have been addressed.  Severity of Illness: The appropriate patient status for this patient is INPATIENT. Inpatient status is judged to be reasonable  and necessary in order to provide the required intensity of service to ensure the patient's safety. The patient's presenting symptoms, physical exam findings, and initial radiographic and laboratory data in the context of their chronic comorbidities is felt to place them at high risk for further clinical deterioration. Furthermore, it is not anticipated that the patient will be medically stable for discharge from the  hospital within 2 midnights of admission.   * I certify that at the point of admission it is my clinical judgment that the patient will require inpatient hospital care spanning beyond 2 midnights from the point of admission due to high intensity of service, high risk for further deterioration and high frequency of surveillance required.*  Author: Collier Bullock, MD 08/25/2021 3:30 PM  For on call review [email or web address].

## 2021-08-25 NOTE — ED Notes (Signed)
Lab called to draw blood for pt. Primary RN tried to draw off IV and tried to start another line and get blood work without success.  ?

## 2021-08-25 NOTE — Transfer of Care (Signed)
Immediate Anesthesia Transfer of Care Note ? ?Patient: Trang Bouse Garlow ? ?Procedure(s) Performed: CYSTOSCOPY WITH RETROGRADE PYELOGRAM/URETERAL STENT PLACEMENT ? ?Patient Location: ICU ? ?Anesthesia Type:General ? ?Level of Consciousness: Patient remains intubated per anesthesia plan ? ?Airway & Oxygen Therapy: Patient placed on Ventilator (see vital sign flow sheet for setting) ? ?Post-op Assessment: Report given to RN and Post -op Vital signs reviewed and stable ? ?Post vital signs: Reviewed and stable ? ?Last Vitals:  ?Vitals Value Taken Time  ?BP 105/48   ?Temp    ?Pulse 61   ?Resp 15   ?SpO2 99   ? ? ?Last Pain:  ?Vitals:  ? 08/25/21 1648  ?TempSrc: Axillary  ?PainSc: 4   ?   ? ?  ? ?Complications: No notable events documented. ?

## 2021-08-25 NOTE — Assessment & Plan Note (Addendum)
Stable ?Continue statins ?Hold metoprolol due to hypotension ?

## 2021-08-25 NOTE — ED Provider Notes (Signed)
[facility] Provider Note    Event Date/Time   First MD Initiated Contact with Patient 08/25/21 1148     (approximate)   History   Altered Mental Status   HPI  [name] is a 71 y.o. female who was at home today and home health came to see her.  She told the home health nurse that she was not feeling well and apparently got sent here.  Patient does have Unna boots on both legs.  These were removed there is some red itchy areas but nothing that looks particularly cellulitic.  There is a small superficial abrasion like area above the knee as well but again not really particularly cellulitic.  She says she is coughing but not bringing anything up.  She does not know if she has a fever.  She felt hot last night.     Physical Exam   Triage Vital Signs: ED Triage Vitals [08/25/21 1120]  Enc Vitals Group     BP (!) 89/39     Pulse Rate (!) 59     Resp 20     Temp 99.2 F (37.3 C)     Temp Source Oral     SpO2 96 %     Weight      Height      Head Circumference      Peak Flow      Pain Score      Pain Loc      Pain Edu?      Excl. in [place]?     Most recent vital signs: Vitals:   08/25/21 1504 08/25/21 1617  BP: (!) 146/55 (!) 100/51  Pulse: (!) 59 60  Resp: (!) 25 (!) 27  Temp:    SpO2: 93% 95%     General: Awake, oriented although seems slightly off once in a while.  Complains of pain whenever we move her over try to touch her. CV:  Good peripheral perfusion.  Heart is difficult to auscultate I do not hear any murmurs Resp:  Normal effort. Lungs sound clear but very distant Abd:  No distention.  Does not seem to be tender.  Patient is complaining of pain but seems to be pain all over. Patient does have a small area in the right groin that appears to be a large pimple or small abscess that drained spontaneously.  It is not fluctuant any longer.  Is not particularly cellulitic.  It just look like it drained. Extremities: Unna boots removed  patient has some small superficial erosions with a little bit of surrounding erythema but does not look particularly cellulitic.  The calves are fairly large there is an area of an abrasion on the right medial calf but also does not look particularly cellulitic.     ED Results / Procedures / Treatments   Labs (all labs ordered are listed, but only abnormal results are displayed) Labs Reviewed  COMPREHENSIVE METABOLIC PANEL - Abnormal; Notable for the following components:      Result Value   Potassium 3.4 (*)    Glucose, Bld 182 (*)    BUN 46 (*)    Creatinine, Ser 2.31 (*)    Calcium 8.5 (*)    Total Protein 5.7 (*)    Albumin 2.9 (*)    AST 72 (*)    GFR, Estimated 22 (*)    All other components within normal limits  CBC - Abnormal; Notable for the following components:   Hemoglobin 11.8 (*)  Platelets 120 (*)    All other components within normal limits  PROTIME-INR - Abnormal; Notable for the following components:   Prothrombin Time 18.5 (*)    INR 1.6 (*)    All other components within normal limits  LACTIC ACID, PLASMA - Abnormal; Notable for the following components:   Lactic Acid, Venous 4.3 (*)    All other components within normal limits  URINALYSIS, COMPLETE (UACMP) WITH MICROSCOPIC - Abnormal; Notable for the following components:   Color, Urine AMBER (*)    APPearance TURBID (*)    Hgb urine dipstick LARGE (*)    Protein, ur 100 (*)    Leukocytes,Ua LARGE (*)    RBC / HPF >50 (*)    WBC, UA >50 (*)    Bacteria, UA MANY (*)    All other components within normal limits  BLOOD GAS, ARTERIAL - Abnormal; Notable for the following components:   pCO2 arterial 31 (*)    pO2, Arterial 60 (*)    Acid-base deficit 2.1 (*)    All other components within normal limits  CBG MONITORING, ED - Abnormal; Notable for the following components:   Glucose-Capillary 167 (*)    All other components within normal limits  RESP PANEL BY RT-PCR (FLU A&B, COVID) ARPGX2  CULTURE,  BLOOD (ROUTINE X 2)  CULTURE, BLOOD (ROUTINE X 2)  URINE CULTURE  APTT  LACTIC ACID, PLASMA  LACTIC ACID, PLASMA  LACTIC ACID, PLASMA  HIV ANTIBODY (ROUTINE TESTING W REFLEX)  HEMOGLOBIN A1C  CBG MONITORING, ED  TROPONIN I (HIGH SENSITIVITY)  TROPONIN I (HIGH SENSITIVITY)     EKG     RADIOLOGY Chest x-ray read by radiology film reviewed by me may show a right middle lobe infiltrate.   PROCEDURES:  Critical Care performed: Critical care time half an hour.  This involves seeing the patient in the hallway and then in the room helping her being moved to the stretcher and helping logroll her and evaluating her skin areas.  Additionally I spoke in detail with the hospitalist about her and we examined the patient together a third time.  I have reviewed her old lab work and some of her old records.  Procedures   MEDICATIONS ORDERED IN ED: Medications  acetaminophen (TYLENOL) tablet 650 mg (has no administration in time range)  atorvastatin (LIPITOR) tablet 40 mg (has no administration in time range)  fenofibrate tablet 160 mg (has no administration in time range)  apixaban (ELIQUIS) tablet 5 mg (has no administration in time range)  ondansetron (ZOFRAN) tablet 4 mg (has no administration in time range)    Or  ondansetron (ZOFRAN) injection 4 mg (has no administration in time range)  insulin aspart (novoLOG) injection 0-20 Units (has no administration in time range)  insulin glargine-yfgn (SEMGLEE) injection 40 Units (has no administration in time range)  cefTRIAXone (ROCEPHIN) 2 g in sodium chloride 0.9 % 100 mL IVPB (has no administration in time range)  lactated ringers bolus 500 mL (0 mLs Intravenous Stopped 08/25/21 1331)  lactated ringers bolus 1,000 mL (0 mLs Intravenous Stopped 08/25/21 1508)  lactated ringers bolus 1,000 mL (0 mLs Intravenous Stopped 08/25/21 1508)  ceFEPIme (MAXIPIME) 1 g in sodium chloride 0.9 % 100 mL IVPB (0 g Intravenous Stopped 08/25/21 1434)      IMPRESSION / MDM / ASSESSMENT AND PLAN / ED COURSE  I reviewed the triage vital signs and the nursing notes. Patient with a very wide pulse pressure on presentation seems to possibly be  septic.  Blood pressure was down and then goes up a little and drops again.  Lab work returned showing normal white count but an elevated lactic acid.  Her renal function is worse than it had been although its been declining.  Chest x-ray noted above.  Patient is not hypoxic although her O2 sats dropped when she sleeps.  Putting on 6 L nasal cannula seems to fix that problem.  Patient is now at 145 complaining of feeling cold.  She still oriented.  She is asking for blankets.  We give these to her and she complains only have to take them off to for example logroll her.  We do not have a urine yet.  Hospital assess her CT chest and I have added a CT abdomen pelvis as well to see if we can find a source for her apparent sepsis.  We will begin antibiotics and fluids actually have already begun antibiotics and fluids.  Troponin is not back although patient does not seem to have any chest pain or tightness or shortness of breath.The patient is on the cardiac monitor to evaluate for evidence of arrhythmia and/or significant heart rate changes.  None have been seen Patient will be admitted.  ----------------------------------------- 3:13 PM on 08/25/2021 ----------------------------------------- Patient has been admitted.  CT returned showing possible pulmonary edema or arts.  Also she has apparently an obstructed right kidney from a kidney stone.  This could be a source of infection as well.  Hospitalist is aware and has consulted urology already.  We will stop the fluids.  Patient also was noted now to have some black material on her lips which was not present before to the knowledge of myself or the nurse.  I moisten this with Q-tips and hemocculted it and it is Hemoccult positive.  Patient also has some black material  in her mouth on her tongue but she will not open her mouth enough for me to examine her mouth well.  I told the hospitalist about this. We have done a type and screen on this lady. FINAL CLINICAL IMPRESSION(S) / ED DIAGNOSES   Final diagnoses:  Sepsis, due to unspecified organism, unspecified whether acute organ dysfunction present [facility])  Urinary tract infection with hematuria, site unspecified  Community acquired pneumonia, unspecified laterality  Renal colic     Rx / DC Orders   ED Discharge Orders     None        Note:  This document was prepared using Dragon voice recognition software and may include unintentional dictation errors.   Nena Polio, MD 08/25/21 1622

## 2021-08-25 NOTE — Assessment & Plan Note (Addendum)
Most likely secondary to ATN from hypotension.  Initially on CRRT and then ultrafiltration.  Nephrology following.  Urine output increasing.  Creatinine today at at 1.89 with GFR of 28, very close to baseline.  Nephrology feels HD no longer needed and dialysis catheter to be removed today.

## 2021-08-25 NOTE — Plan of Care (Signed)
Care plan initiated.

## 2021-08-25 NOTE — Assessment & Plan Note (Addendum)
Patient met criteria for septic shock on admission given lactic acid level of 4.3, urinary tract infection source, cultures that grew out E. coli as well as on refractory hypotension requiring pressor support, tachycardia and tachypnea.  Sepsis now stabilized.  Patient weaned off of pressor support and completed 10 days of antibiotics on 3/11. ?

## 2021-08-25 NOTE — Sepsis Progress Note (Addendum)
Sent secure chat to bedside RN about second LA needing to be drawn ?

## 2021-08-25 NOTE — Progress Notes (Signed)
Pt c/o chest pain, 5/10, skin color dusky, Dr Erenest Rasher notified ?

## 2021-08-25 NOTE — Anesthesia Procedure Notes (Signed)
Procedure Name: Intubation ?Date/Time: 08/25/2021 5:56 PM ?Performed by: Lily Peer, Lajoyce Tamura, CRNA ?Pre-anesthesia Checklist: Patient identified, Emergency Drugs available, Suction available and Patient being monitored ?Patient Re-evaluated:Patient Re-evaluated prior to induction ?Oxygen Delivery Method: Circle system utilized ?Preoxygenation: Pre-oxygenation with 100% oxygen ?Induction Type: IV induction ?Ventilation: Mask ventilation without difficulty ?Laryngoscope Size: McGraph and 3 ?Grade View: Grade I ?Tube type: Oral ?Tube size: 7.0 mm ?Number of attempts: 1 ?Airway Equipment and Method: Stylet, Oral airway and Video-laryngoscopy ?Placement Confirmation: ETT inserted through vocal cords under direct vision, positive ETCO2 and breath sounds checked- equal and bilateral ?Secured at: 20 cm ?Tube secured with: Tape ?Dental Injury: Teeth and Oropharynx as per pre-operative assessment  ? ? ? ? ?

## 2021-08-25 NOTE — Assessment & Plan Note (Addendum)
Now that she is weaned off of pressor support, blood pressure starting to trend back upwards.  We will slowly restart her home medications.  Currently just on torsemide.

## 2021-08-25 NOTE — Anesthesia Postprocedure Evaluation (Signed)
Anesthesia Post Note ? ?Patient: [name] ? ?Procedure(s) Performed: CYSTOSCOPY WITH RETROGRADE PYELOGRAM/URETERAL STENT PLACEMENT ? ?Patient location during evaluation: ICU ?Anesthesia Type: General ?Level of consciousness: sedated ?Pain management: pain level controlled ?Vital Signs Assessment: vitals unstable ?Respiratory status: patient remains intubated per anesthesia plan and patient on ventilator - see flowsheet for VS ?Cardiovascular status: unstable ?Postop Assessment: no apparent nausea or vomiting ?Anesthetic complications: no ?Comments: Pt remains intubated for concerns of respiratory failure and is requiring pressors.  Transported to ICU on monitors.  Report given to NP [facility] in ICU. ? ? ?No notable events documented. ? ? ?Last Vitals:  ?Vitals:  ? 08/25/21 1838 08/25/21 1900  ?BP: (!) 105/48 114/73  ?Pulse: 61 60  ?Resp: 20 20  ?Temp:    ?SpO2: 99% 99%  ?  ?Last Pain:  ?Vitals:  ? 08/25/21 1835  ?TempSrc: Axillary  ?PainSc:   ? ? ?  ?  ?  ?  ?  ?  ? ?Darrin Nipper ? ? ? ? ?

## 2021-08-25 NOTE — Assessment & Plan Note (Addendum)
Meets criteria with BMI greater than 50 ?Complicates overall prognosis and care ?

## 2021-08-25 NOTE — Assessment & Plan Note (Addendum)
Patient most likely has underlying obstructive sleep apnea related to morbid obesity as well as possible obesity hypoventilation syndrome. ?Her pulse oximetry drops in the low 80s while asleep.  We will see about starting nightly CPAP. ?

## 2021-08-25 NOTE — ED Notes (Signed)
Rainbow with gray on ice sent to lab. ?

## 2021-08-25 NOTE — ED Notes (Signed)
Pt presents to ED via EMS with c/o of being sent by home health nurse due to [place]. Pt state she lives at home. Pt presents with foul smell of urine. Pt does present with leg wounds also that have been being treated which look to be healing at this time. Pt denies fevers or chills. Pt is morbidly obese. Pt denies N/V/D. Depends that pt presented with was heavily saturated with foul smelling urine.  ?

## 2021-08-25 NOTE — ED Notes (Signed)
Lactic acid 4.3, MD Malinda made aware.  ?

## 2021-08-25 NOTE — Sepsis Progress Note (Signed)
Pt to OR, will continue to monitor sepsis protocol ?

## 2021-08-25 NOTE — Consult Note (Addendum)
NAME:  [name], MRN:  [id number], DOB:  24-Jul-1950, LOS: 0 ADMISSION DATE:  08/25/2021, CONSULTATION DATE:  08/25/2021 REFERRING MD:  Dr. Francine Graven, CHIEF COMPLAINT:  Altered Mental Status   Brief Pt Description / Synopsis:  71 y.o. Female admitted with Severe Urosepsis with Septic Shock in the setting of Right proximal ureteral stone with Hydronephrosis & large left renal stone, Acute Kidney Injury, and Acute Metabolic Encephalopathy.  She underwent urgent cystoscopy with bilateral ureteral stent placement.  She returns to ICU post procedure and remains intubated.  History of Present Illness:  [name] is a 71 year old female with a past medical history significant for HFpEF, severe aortic stenosis status post TAVR, morbid obesity, obesity hypoventilation syndrome, OSA, diabetes mellitus, coronary artery disease, hypertension who presented to [facility] ED on 08/25/2021 due to complaints of altered mental status.  Patient is currently intubated and sedated following return from the OR and no family is available, therefore history is obtained from chart review.  ED Course: Initial vital signs: Temperature 99.2, respiratory rate 27, pulse , blood pressure  Significant Labs: Potassium 3.4, bicarbonate 22, glucose 182, BUN 46, creatinine 2.31, calcium 8.5, anion gap 10, albumin 2.9, AST 72, ALT 36, total bilirubin 5.7, alkaline phosphatase 45, WBC 8.9, hemoglobin 11.8, hematocrit 37.5, platelets 120, PT 18.5, INR 1.6, lactic acid 4.3 COVID-19 PCR is negative Urinalysis consistent with UTI Imaging: Chest x-ray>>IMPRESSION: Mild right basilar subsegmental atelectasis or infiltrate is noted. CT chest>>IMPRESSION: Bilateral perihilar airspace filling pattern left more than right most consistent with acute edema/ARDS. Pneumonia not excluded, but there is no dense consolidation, lobar distribution or collapse. No effusion. Aortic Atherosclerosis CT abdomen pelvis>>IMPRESSION: Hydronephrosis on the  right due to 2 adjacent 6 mm stones in the proximal right ureter, at about the level of the lower pole of the kidney. Possibility of infection associated with the obstruction does exist. 1 cm stone in the left renal pelvis without evidence of obstruction presently. Chololithiasis without CT evidence of cholecystitis or obstruction. Insignificant 13 mm adrenal adenoma, unchanged since previous exam. Medications administered: 2.5 L of LR boluses, IV cefepime, Flagyl, vancomycin   Hospitalist were asked to admit for further work-up and treatment of severe urosepsis due to right proximal ureter stone with hydronephrosis and large left renal stone.  Urology was consulted.  She was taken urgently to the OR for cystoscopy with bilateral ureteral stent placement.  Anesthesia does report she required vasopressors Intra-Op.  She returns to ICU post procedure and remains intubated.  PCCM consulted for management of mechanical ventilation and critical illness.  Pertinent  Medical History  HFpEF Severe aortic stenosis status post TAVR Coronary artery disease Hypertension Hyperlipidemia Obesity hypoventilation syndrome OSA Morbid obesity Diabetes mellitus  Micro Data:  08/25/2021: SARS-CoV-2 and influenza PCR>> negative 08/25/2021: Blood culture>> 08/25/2021: Urine>> 08/25/2021: Tracheal aspirate>> 08/25/2021: Strep pneumo urinary antigen>> 08/25/2021: Legionella urine antigen>>  Antimicrobials:  Cefepime x1 dose 3/2 Flagyl x1 dose 3/2 Vancomycin x1 dose 3/2 Ceftriaxone 3/2>>  Significant Hospital Events: Including procedures, antibiotic start and stop dates in addition to other pertinent events   3/2: Presented to ED, admitted by hospitalist.  Urology consulted, taken emergently to the OR for cystoscopy and bilateral ureteral stent placement.  Returns to ICU postop and remains intubated, PCCM consulted  Interim History / Subjective:  -Patient taken urgently to the OR by urology for bilateral ureteral  stent placement -Anesthesia noted Intra-Op she was hypotensive requiring vasopressors -Arrived to ICU and remains intubated, PCCM consulted -Critically ill  -Follow-up  lab work, ABG, chest x-ray pending  Objective   Blood pressure (!) 105/48, pulse 61, temperature (!) 100.7 F (38.2 C), temperature source Axillary, resp. rate 20, height _0  (1.651 m), weight (!) 154.7 kg, SpO2 99 %.    Vent Mode: PRVC FiO2 (%):  [100 %] 100 % Set Rate:  [16 bmp] 16 bmp Vt Set:  [460 mL] 460 mL PEEP:  [5 cmH20] 5 cmH20 Plateau Pressure:  [26 cmH20] 26 cmH20   Intake/Output Summary (Last 24 hours) at 08/25/2021 1849 Last data filed at 08/25/2021 6438 Gross per 24 hour  Intake 750 ml  Output --  Net 750 ml   Filed Weights   08/25/21 1300 08/25/21 1648  Weight: (!) 154.7 kg (!) 154.7 kg    Examination: General: Acute on chronically ill-appearing female, laying in bed, intubated and sedated, no acute distress HENT: Atraumatic, normocephalic, neck supple, difficult to assess JVD due to body habitus Lungs: Coarse breath sounds bilaterally, even, nonlabored, synchronous with the vent Cardiovascular: Regular rate and rhythm, S1-S2, no murmurs, rubs, gallops Abdomen: Morbidly obese, soft, nontender, nondistended, no guarding rebound tenderness, distant bowel sounds Extremities: No deformities, chronic venous trophic changes to bilateral lower extremities, no edema, no cyanosis or clubbing Neuro: Sedated (just arrived from the OR), pupils PERRLA GU: Foley catheter in place draining yellow urine  Resolved Hospital Problem list     Assessment & Plan:   Septic Shock Chronic HFpEF Elevated Troponin, suspect demand ischemia Paroxsymal Atrial Fibrillation PMHx: Severe aortic stenosis s/p TAVR, CAD, MI, Hypertension, Hyperlipidemia -Continuous cardiac monitoring -Maintain MAP >65 -IV fluids -Vasopressors as needed to maintain MAP goal -Trend lactic acid until normalized -Trend HS Troponin until  peaked -Echocardiogram pending -Continue home Eliquis for anticoagulation  Severe Urosepsis in the setting of Right proximal ureteral stone with Hydronephrosis, large left renal stone -Status post cystoscopy with bilateral ureteral stent placement on 08/25/2021 -Monitor fever curve -Trend WBC's & Procalcitonin -Follow cultures as above -Continue empiric Ceftriaxone pending cultures & sensitivities  Acute Hypoxic Respiratory Failure in the setting of severe sepsis, multiple metabolic derangements, & ? Pneumonia PMHx: OSA -Full vent support, implement lung protective strategies -Plateau pressures less than 30 cm H20 -Wean FiO2 & PEEP as tolerated to maintain O2 sats >92% -Follow intermittent Chest X-ray & ABG as needed -Spontaneous Breathing Trials when respiratory parameters met and mental status permits -Implement VAP Bundle -Prn Bronchodilators  Acute Kidney Injury Anion Gap Metabolic Acidosis in the setting of Lactic Acidosis and AKI -Monitor I&O's / urinary output -Follow BMP -Ensure adequate renal perfusion -Avoid nephrotoxic agents as able -Replace electrolytes as indicated -IV Fluids -Consider Nephrology consult  Mild Thrombocytopenia, likely in setting of severe sepsis -Monitor for S/Sx of bleeding -Trend CBC -Eliquis for VTE Prophylaxis/anticoagulaiton  -Transfuse for Hgb <7 -Transfuse Platelets for PLT count <50 with active bleeding  Acute Metabolic Encephalopathy Sedation needs in the setting of mechanical ventilation -Maintain a RASS goal of 0 to -1 -Fentanyl and Propofol as needed to maintain RASS goal -Avoid sedating medications as able -Daily wake up assessment -Treat sepsis and metabolic derangements as above  Diabetes Mellitus -CBG's q4h; Target range of 140 to 180 -SSI -Follow ICU Hypo/Hyperglycemia protocol      Best Practice (right click and "Reselect all SmartList Selections" daily)   Diet/type: NPO DVT prophylaxis: DOAC GI prophylaxis:  PPI Lines: N/A Foley:  Yes, and it is still needed Code Status:  full code Last date of multidisciplinary goals of care discussion [N/A]  Labs  CBC: Recent Labs  Lab 08/25/21 1128  WBC 8.9  HGB 11.8*  HCT 37.5  MCV 92.4  PLT 120*    Basic Metabolic Panel: Recent Labs  Lab 08/25/21 1128  NA 140  K 3.4*  CL 108  CO2 22  GLUCOSE 182*  BUN 46*  CREATININE 2.31*  CALCIUM 8.5*   GFR: Estimated Creatinine Clearance: 34.4 mL/min (A) (by C-G formula based on SCr of 2.31 mg/dL (H)). Recent Labs  Lab 08/25/21 1128 08/25/21 1229  WBC 8.9  --   LATICACIDVEN  --  4.3*    Liver Function Tests: Recent Labs  Lab 08/25/21 1128  AST 72*  ALT 36  ALKPHOS 45  BILITOT 0.9  PROT 5.7*  ALBUMIN 2.9*   No results for input(s): LIPASE, AMYLASE in the last 168 hours. No results for input(s): AMMONIA in the last 168 hours.  ABG    Component Value Date/Time   PHART 7.44 08/25/2021 1402   PCO2ART 31 (L) 08/25/2021 1402   PO2ART 60 (L) 08/25/2021 1402   HCO3 21.1 08/25/2021 1402   ACIDBASEDEF 2.1 (H) 08/25/2021 1402   O2SAT 91.4 08/25/2021 1402     Coagulation Profile: Recent Labs  Lab 08/25/21 1128  INR 1.6*    Cardiac Enzymes: No results for input(s): CKTOTAL, CKMB, CKMBINDEX, TROPONINI in the last 168 hours.  HbA1C: Hgb A1c MFr Bld  Date/Time Value Ref Range Status  07/12/2017 01:09 AM 6.4 (H) 4.8 - 5.6 % Final    Comment:    (NOTE) Pre diabetes:          5.7%-6.4% Diabetes:              >6.4% Glycemic control for   <7.0% adults with diabetes   05/04/2016 05:20 AM 6.3 (H) 4.8 - 5.6 % Final    Comment:    (NOTE)         Pre-diabetes: 5.7 - 6.4         Diabetes: >6.4         Glycemic control for adults with diabetes: <7.0     CBG: Recent Labs  Lab 08/25/21 1148 08/25/21 1605 08/25/21 1835  GLUCAP 167* 173* 166*    Review of Systems:   Unable to assess due to intubation and sedation   Past Medical History:  She,  has a past medical  history of Bacteremia, CAD (coronary artery disease), Diabetes mellitus without complication ([place]), Diastolic CHF ([place]), Hyperlipemia, Hypertension, Lymphedema, MI (myocardial infarction) ([place]), OSA (obstructive sleep apnea), and Severe aortic stenosis.   Surgical History:   Past Surgical History:  Procedure Laterality Date   CARDIAC SURGERY     none     PACEMAKER INSERTION       Social History:   reports that she has never smoked. She has never used smokeless tobacco. She reports that she does not drink alcohol and does not use drugs.   Family History:  Her family history includes Cancer in her mother; Heart failure in her father.   Allergies Allergies  Allergen Reactions   Tetanus Toxoid, Adsorbed Swelling   Tetanus Toxoid     Other reaction(s): Unknown   Tetanus Toxoids Swelling   Lisinopril Rash    Other reaction(s): Unknown   Niacin Rash    Other reaction(s): Unknown   Sitagliptin Rash    Other reaction(s): Unknown   Sulfamethoxazole-Trimethoprim Rash    Other reaction(s): Unknown     Home Medications  Prior to Admission medications   Medication Sig Start Date  End Date Taking? Authorizing Provider  acetaminophen (TYLENOL) 325 MG tablet Take 2 tablets (650 mg total) by mouth every 6 (six) hours as needed for mild pain (or Fever >/= 101). 11/05/17  Yes Wieting, Richard, MD  amLODipine (NORVASC) 5 MG tablet Take 5 mg by mouth daily. 08/09/21  Yes [provider]  apixaban (ELIQUIS) 5 MG TABS tablet Take 1 tablet by mouth 2 (two) times daily. 08/10/17  Yes [provider]  atorvastatin (LIPITOR) 40 MG tablet Take 1 tablet (40 mg total) by mouth daily at 6 PM. 05/09/16  Yes Epifanio Lesches, MD  cefpodoxime (VANTIN) 100 MG tablet Take 1 tablet (100 mg total) by mouth 2 (two) times daily. 11/29/20  Yes Billey Co, MD  Cholecalciferol 50 MCG (2000 UT) CAPS Take 1 capsule by mouth daily.   Yes [provider]  docusate sodium (COLACE) 100 MG  capsule Take 1 capsule (100 mg total) by mouth daily. 11/05/17  Yes Wieting, Richard, MD  fenofibrate (TRICOR) 145 MG tablet Take 1 tablet by mouth daily. 04/17/16  Yes [provider]  furosemide (LASIX) 40 MG tablet Take 1 tablet (40 mg total) by mouth daily. 07/17/16  Yes Vaughan Basta, MD  insulin aspart (NOVOLOG FLEXPEN) 100 UNIT/ML FlexPen Inject 30 Units into the skin daily. 10/28/19  Yes [provider]  Insulin Glargine (BASAGLAR KWIKPEN) 100 UNIT/ML Inject 70 Units into the skin daily. 04/23/17  Yes [provider]  metFORMIN (GLUCOPHAGE) 1000 MG tablet Take 1 tablet (1,000 mg total) by mouth daily with breakfast. 05/22/17  Yes Max Sane, MD  metoprolol tartrate (LOPRESSOR) 25 MG tablet Take 1 tablet (25 mg total) by mouth 2 (two) times daily. 05/22/17  Yes Max Sane, MD  Potassium Chloride ER 20 MEQ TBCR Take 1 tablet by mouth daily. 02/09/20  Yes [provider]  amiodarone (PACERONE) 200 MG tablet Take 1 tablet (200 mg total) by mouth daily. Patient not taking: Reported on 08/25/2021 11/06/17   Loletha Grayer, MD  BD PEN NEEDLE NANO 2ND GEN 32G X 4 MM MISC 4 (four) times daily. use as directed 02/09/20   [provider]  tamsulosin (FLOMAX) 0.4 MG CAPS capsule Take 1 capsule (0.4 mg total) by mouth daily. Patient not taking: Reported on 08/25/2021 02/05/20   Billey Co, MD     Critical care time: 60 minutes     Darel Hong, AGACNP-BC [facility] Prefer epic messenger for cross cover needs If after hours, please call E-link

## 2021-08-25 NOTE — Procedures (Addendum)
Central Venous Catheter Insertion Procedure Note ? ?[name]  ?[id number]  ?[birth year] ? ?Date:08/25/21  ?Time:9:31 PM  ? ?Provider Performing:Shacarra Choe A Nyelah Emmerich  ? ?Procedure: Insertion of Non-tunneled Central Venous Catheter(36556) with US guidance (85909)  ? ?Indication(s) ?Medication administration and Difficult access ? ?Consent ?Unable to obtain consent due to emergent nature of procedure. ? ?Anesthesia ?Topical only with 1% lidocaine  ? ?Timeout ?Verified patient identification, verified procedure, site/side was marked, verified correct patient position, special equipment/implants available, medications/allergies/relevant history reviewed, required imaging and test results available. ? ?Sterile Technique ?Maximal sterile technique including full sterile barrier drape, hand hygiene, sterile gown, sterile gloves, mask, hair covering, sterile ultrasound probe cover (if used). ? ?Procedure Description ?Area of catheter insertion was cleaned with chlorhexidine and draped in sterile fashion.  With real-time ultrasound guidance a central venous catheter was placed into the right internal jugular vein. Nonpulsatile blood flow and easy flushing noted in all ports.  The catheter was sutured in place and sterile dressing applied. ? ?Complications/Tolerance ?None; patient tolerated the procedure well. ?Chest X-ray is ordered to verify placement for internal jugular or subclavian cannulation.   Chest x-ray is not ordered for femoral cannulation. ? ?EBL ?Minimal ? ?Specimen(s) ?None ? ? ?[name], DNP, CCRN, FNP-C, AGACNP-BC ?Acute Care Nurse Practitioner  ?[facility] ?Pager: [phone] ?[name] [facility] ? ? ?

## 2021-08-25 NOTE — Progress Notes (Addendum)
Pharmacy Antibiotic Note ? ?[name] is a 71 y.o. female admitted on 08/25/2021.  Pharmacy has been consulted for vancomycin and cefepime dosing for sepsis for 7 days. ? ?Plan: ?Cefepime 2 g IV q12h ?Vancomycin 2500 mg IV x 1 loading dose. Will hold off on maintenance dose as pt noted to have AKI. Will reassess tomorrow (3/3) for dosing regimen ?Monitor renal function and adjust dose as clinically indicated ? ?**Antibiotics changed to ceftriaxone ? ?Height: 5\' 5"  (165.1 cm) ?Weight: (!) 154.7 kg (341 lb 0.8 oz) ?IBW/kg (Calculated) : 57 ? ?Temp (24hrs), Avg:99.2 ?F (37.3 ?C), Min:99.2 ?F (37.3 ?C), Max:99.2 ?F (37.3 ?C) ? ?Recent Labs  ?Lab 08/25/21 ?1128 08/25/21 ?1229  ?WBC 8.9  --   ?CREATININE 2.31*  --   ?LATICACIDVEN  --  4.3*  ?  ?Estimated Creatinine Clearance: 34.4 mL/min (A) (by C-G formula based on SCr of 2.31 mg/dL (H)).   ? ?Allergies  ?Allergen Reactions  ? Tetanus Toxoid, Adsorbed Swelling  ? Tetanus Toxoid   ?  Other reaction(s): Unknown  ? Tetanus Toxoids Swelling  ? Lisinopril Rash  ?  Other reaction(s): Unknown  ? Niacin Rash  ?  Other reaction(s): Unknown  ? Sitagliptin Rash  ?  Other reaction(s): Unknown  ? Sulfamethoxazole-Trimethoprim Rash  ?  Other reaction(s): Unknown  ? ? ?Antimicrobials this admission: ?3/2 cefepime >>  ?3/2 metronidazole x 1 ?3/2 vancomycin >>  ? ? ?Thank you for allowing pharmacy to be a part of this patient?s care. ? ?Forde Dandy Jaydalee Bardwell ?08/25/2021 2:21 PM ? ?

## 2021-08-25 NOTE — Consult Note (Signed)
?  ? ?Urology Consult  ? ?I have been asked to see the patient by Dr. Francine Graven, for evaluation and management of right proximal ureteral stone, large left renal stone, UTI, and presumed sepsis from urinary source. ? ?Chief Complaint: Confusion, right groin pain ? ?HPI:  ?[name] is a 71 y.o. year old extremely comorbid female with morbid obesity(BMI 52), diabetes, CAD, sleep apnea, hypertension who presents with altered mental status and right-sided groin pain for 24 hours.  The history is obtained entirely from her cousin [name] at bedside, as the patient has altered mental status.   Apparently she was in her normal state of health until today when she started to get more confused and complained of some right-sided groin pain, confusion worsened and she ultimately was brought to the ER via EMS. ? ?Work-up with CT abdomen and pelvis showed 6 mm right proximal ureteral stone with upstream hydronephrosis, as well as a larger 1 cm left renal stone just above the left UPJ, urinalysis contaminated but grossly concerning for infection, elevated lactate of 4.3, and AKI with creatinine of 2.3 from of prior value of 1.6. ? ?PMH: ?Past Medical History:  ?Diagnosis Date  ? Bacteremia   ? CAD (coronary artery disease)   ? Diabetes mellitus without complication ([place])   ? Diastolic CHF ([place])   ? Hyperlipemia   ? Hypertension   ? Lymphedema   ? MI (myocardial infarction) ([place])   ? OSA (obstructive sleep apnea)   ? Severe aortic stenosis   ? ? ?Surgical History: ?Past Surgical History:  ?Procedure Laterality Date  ? CARDIAC SURGERY    ? none    ? PACEMAKER INSERTION    ? ? ? ?Allergies:  ?Allergies  ?Allergen Reactions  ? Tetanus Toxoid, Adsorbed Swelling  ? Tetanus Toxoid   ?  Other reaction(s): Unknown  ? Tetanus Toxoids Swelling  ? Lisinopril Rash  ?  Other reaction(s): Unknown  ? Niacin Rash  ?  Other reaction(s): Unknown  ? Sitagliptin Rash  ?  Other reaction(s): Unknown  ? Sulfamethoxazole-Trimethoprim Rash  ?  Other  reaction(s): Unknown  ? ? ?Family History: ?Family History  ?Problem Relation Age of Onset  ? Cancer Mother   ? Heart failure Father   ? ? ?Social History:  reports that she has never smoked. She has never used smokeless tobacco. She reports that she does not drink alcohol and does not use drugs. ? ?ROS: ?Unable to be obtained secondary to patient condition ? ?Physical Exam: ?BP (!) 146/55   Pulse (!) 59   Temp 99.2 ?F (37.3 ?C) (Oral)   Resp (!) 25   Ht 5\' 5"  (1.651 m)   Wt (!) 154.7 kg   SpO2 93%   BMI 56.75 kg/m?   ? ?Constitutional: Confused, moaning ?Cardiovascular: Regular rate and rhythm ?Respiratory: Decreased breath sounds bilaterally ?GI: Abdomen is soft, nontender, nondistended, no abdominal masses ?GU: Right CVA tenderness ?ENT: Blackened lips ?Lymph: No cervical or inguinal lymphadenopathy. ?Skin: Lower extremity edema, erythematous ankles ? ? ?Laboratory Data: ?Reviewed in epic ?COVID test pending ? ?Pertinent Imaging: ?I have personally reviewed the CT showing two 6 mm right proximal ureteral stones with upstream hydronephrosis, larger 1 cm left renal stone just above the UPJ. ? ?Assessment & Plan:   ?Extremely comorbid 71 year old female with morbid obesity, diabetes, CAD, hypertension, sleep apnea who presents with at least 12 hours of worsening confusion and clinical picture consistent with sepsis from urinary source with right proximal ureteral stone and  large left renal stone just above the left UPJ.  We will plan for bilateral ureteral stents. ? ?We discussed the need for drainage in the setting of an infected and obstructed system.  A ureteral stent is a small plastic tube that is placed cystoscopically with one end in the kidney and the other end in the bladder that allows the infection from the kidney to drain, and relieves pain from the obstructing stone.  We discussed the risks at length including bleeding, infection, sepsis, death, ureteral injury, and stent related symptoms  including urgency/frequency/dysuria/flank pain/gross hematuria.  There is a low, but not 0, risk of inability to pass the ureteral stent alongside the stone from below which would require percutaneous nephrostomy tube by interventional radiology.  Finally, we discussed possible prolonged hospitalization and recovery, possible temporary Foley catheter placement, and 10 to 14-day course of antibiotics.  We also reviewed that with her extensive comorbidities she is at significantly higher risk for complications and prolonged hospitalization, and even death. ? ?We reviewed the need for a follow-up procedure for definitive management of their stone when the infection has been treated in 2 to 3 weeks with ureteroscopy/laser lithotripsy. ? ? ?Recommendations: ?Agree with admission for broad-spectrum antibiotics and resuscitation ?OR tonight for cystoscopy and bilateral ureteral stent placement ? ?Billey Co, MD ? ? ?[facility] ?[address], [address] ?[place], [place] [address] ?[phone] ? ? ?

## 2021-08-25 NOTE — Assessment & Plan Note (Addendum)
Secondary to septic shock and uremia.  Resolved.  Patient today is quite oriented and appropriate, able to interact. ?

## 2021-08-25 NOTE — Assessment & Plan Note (Addendum)
Patient noted to have obstructive uropathy with 2 6 mm stones in the proximal right ureter resulting in right-sided hydronephrosis as well as left-sided hydronephrosis.  Appreciate urology help with status post bilateral stent placement.  Plan is for follow-up cystoscopy to remove stents and laser lithotripsy in 2 to 3 weeks. ?

## 2021-08-25 NOTE — Sepsis Progress Note (Signed)
Elink monitoring sepsis protocol ?

## 2021-08-25 NOTE — Anesthesia Preprocedure Evaluation (Addendum)
Anesthesia Evaluation  ?Patient identified by MRN, date of birth, ID band ?Patient confused ? ? ? ?Reviewed: ?Allergy & Precautions, NPO status , Patient's Chart, lab work & pertinent test results ? ?History of Anesthesia Complications ?Negative for: history of anesthetic complications ? ?Airway ?Mallampati: IV ? ? ? ? ? ?Comment: Dried blood noted on lips; no apparent active bleeding Dental ? ?(+) Partial Upper, Partial Lower ?Pt says she is unable to remove lower partials:   ?Pulmonary ?sleep apnea ,  ?Obesity hypoventilation syndrome ?  ?Pulmonary exam normal ?breath sounds clear to auscultation ? ? ? ? ? ? Cardiovascular ?hypertension, + CAD (s/p MI) and +CHF  ?+ dysrhythmias (a fib on Eliquis) + pacemaker (complete heart block) + Valvular Problems/Murmurs (severe AS s/p TAVR 2019)  ?Rhythm:Irregular Rate:Normal ? ?ECG 08/25/21: Ventricular-paced rhythm ? ?Echo 06/06/21:  ???1. The left ventricle is normal in size with moderately increased wall thickness.  ???2. The left ventricular systolic function is normal, LVEF is visually estimated at 55-60%.  ???3. The apex is hypokinetic.  ???4. Right ventricle is not well visualized.  ???5. The mitral valve leaflets are moderately thickened with reduced leaflet mobility.  ???6. Mitral annular calcification is present (severe).  ???7. There is mild mitral valve regurgitation.  ???8. There is mild mitral stenosis.  ???9. Aortic valve replacement (29 mm Evolut Pro TAVR, implantation date: 08/07/17).  ???10. Aortic valve Doppler indices are consistent with normal prosthetic valve function.  ???11. There is moderate pulmonary hypertension.  ???12. Technically difficult study. ?  ?Neuro/Psych ?negative neurological ROS ?   ? GI/Hepatic ?negative GI ROS,   ?Endo/Other  ?diabetes, Type 2Class 3 obesity ? Renal/GU ?Renal disease (stage III CKD)Obstructive uropathy with sepsis  ? ?  ?Musculoskeletal ? ? Abdominal ?  ?Peds ? Hematology ?negative  hematology ROS ?(+)   ?Anesthesia Other Findings ?Cardiology note 08/04/21:  ?1. Paroxysmal atrial fibrillation: Patient is currently taking metoprolol for heart rate control and maintenance of normal sinus rhythm. Patient is anticoagulated on Eliquis 5 mg for stroke risk reduction. Patient is not currently having any symptoms of worsening palpitations or atrial fibrillation. Patient is currently well rate controlled at 75 bpm. ?-Continue metoprolol 25 mg twice daily ?-Continue Eliquis 5 mg twice daily for stroke risk reduction ?? ?2. Pacemaker interrogation: Patient has an implanted pacemaker secondary to complete heart block. most recent pacemaker interrogation that showed normal voltage of the battery and appropriate estimated longevity with normal lead function. No necessary reprogramming at this time. ?? ?3. Aortic valve prosthesis: Patient had TAVR in 2019 secondary to severe aortic stenosis. Patient has not had any complications after her procedure and is currently stable without cardiac murmur. Last echocardiogram on 05/2021 showed trivial aortic regurgitation, normal LV systolic function, normal RV systolic dysfunction. ?-Continue regular surveillance as symptoms indicate ?? ?4. Hypertension: Patient's blood pressure is stable in the office today at 124/72. Patient is currently taking a combination of Lasix, metoprolol for blood pressure management. ?-No medication changes at this time ?-Patient encouraged to eat a low-sodium diet in order to further help treat her hypertension ?? ?5. Hyperlipidemia: Patient has a history of hyperlipidemia currently being treated with atorvastatin 40 mg. Patient is not currently having any side effects of the medication. Last lipid panel on 05/13/2020 showed an LDL of 54. ?-No medication changes at this time. ?? ?6. Bilateral leg edema: Patient has a history of bilateral leg edema for which she takes Lasix alternating 40 and 60 mg daily. Patient's legs are not currently  swollen at this visit. Patient states that she has been stable on her current medications and has no side effects. ?-No medication changes at this time ? ?No orders of the defined types were placed in this encounter. ? ?Return in about 6 months (around 02/01/2022). ? ? Reproductive/Obstetrics ? ?  ? ? ? ? ? ? ? ? ? ? ? ? ? ?  ?  ? ? ? ? ? ? ?Anesthesia Physical ?Anesthesia Plan ? ?ASA: 4 and emergent ? ?Anesthesia Plan: General  ? ?Post-op Pain Management:   ? ?Induction: Intravenous ? ?PONV Risk Score and Plan: 3 and Ondansetron, Dexamethasone and Treatment may vary due to age or medical condition ? ?Airway Management Planned: Oral ETT ? ?Additional Equipment: Arterial line ? ?Intra-op Plan:  ? ?Post-operative Plan: Extubation in OR and Possible Post-op intubation/ventilation ? ?Informed Consent: I have reviewed the patients History and Physical, chart, labs and discussed the procedure including the risks, benefits and alternatives for the proposed anesthesia with the patient or authorized representative who has indicated his/her understanding and acceptance.  ? ? ? ?Dental advisory given and Consent reviewed with POA ? ?Plan Discussed with: CRNA ? ?Anesthesia Plan Comments: (Patient's cousin, Rayetta Humphrey, consented over the phone for risks of anesthesia including but not limited to:  ?- adverse reactions to medications ?- damage to eyes, teeth, lips or other oral mucosa ?- nerve damage due to positioning  ?- sore throat or hoarseness ?- damage to heart, brain, nerves, lungs, other parts of body or loss of life ? ?Informed patient's cousin about role of CRNA in peri- and intra-operative care, she voiced understanding.  Also discussed possible postoperative intubation and ventilation considering patient's severity of illness.)  ? ? ? ? ?Anesthesia Quick Evaluation ? ?

## 2021-08-25 NOTE — Assessment & Plan Note (Addendum)
History of paroxysmal atrial fibrillation.  Currently in atrial fibrillation.  Heart rate currently well controlled.  On heparin infusion and when she is more awake, will restart Eliquis if renal function improves.  Her home amiodarone is also on hold although although reportedly, she has not been taking this medication. ?

## 2021-08-25 NOTE — Assessment & Plan Note (Addendum)
A1c of 7.6.  Currently on Lantus and sliding scale.  CBGs in the 120s to 170s.

## 2021-08-25 NOTE — ED Triage Notes (Signed)
Pt comes into the ED via EMS from home with c/o AMS, foul smelling urine odor, tylenol taken at home around 1030, pt has chronic wound on her legs. Home health called 911 due to pt AMS today ? ?Temp 98.1 ?AJ518 ?CO27 ?86%RA, 94% 6L [place] ?90/70 ?53ml NS infusing on arrival ?IV #20g LHand ?[id number] ?

## 2021-08-25 NOTE — ED Notes (Signed)
Purewick placed to obtain UA, pt refusing In and out cath, MD aware ?

## 2021-08-25 NOTE — Progress Notes (Signed)
Pt arrived via stretcher from ED, pt Oriented to self and place. ?Temperature upon arrival 103, sepsis protocol in progress. ?

## 2021-08-25 NOTE — Progress Notes (Signed)
PHARMACY - PHYSICIAN COMMUNICATION ?CRITICAL VALUE ALERT - BLOOD CULTURE IDENTIFICATION (BCID) ? ?[name] is an 71 y.o. female who presented to [facility] on 08/25/2021 with a chief complaint of altered mental status.  ? ?Assessment: Patient presenting with sepsis with suspected urinary source with right proximal ureteral stone and large left renal stone. Blood cultures growing e. coli in 2 of 2 bottles, no resistance detected.  ? ?Name of physician (or Provider) Contacted: [name] ? ?Current antibiotics: ceftriaxone 2 g IV q24h  ? ?Changes to prescribed antibiotics recommended:  ?Patient is on recommended antibiotics - No changes needed ? ?Results for orders placed or performed during the hospital encounter of 08/25/21  ?Blood Culture ID Panel (Reflexed) (Collected: 08/25/2021 12:29 PM)  ?Result Value Ref Range  ? Enterococcus faecalis NOT DETECTED NOT DETECTED  ? Enterococcus Faecium NOT DETECTED NOT DETECTED  ? Listeria monocytogenes NOT DETECTED NOT DETECTED  ? Staphylococcus species NOT DETECTED NOT DETECTED  ? Staphylococcus aureus (BCID) NOT DETECTED NOT DETECTED  ? Staphylococcus epidermidis NOT DETECTED NOT DETECTED  ? Staphylococcus lugdunensis NOT DETECTED NOT DETECTED  ? Streptococcus species NOT DETECTED NOT DETECTED  ? Streptococcus agalactiae NOT DETECTED NOT DETECTED  ? Streptococcus pneumoniae NOT DETECTED NOT DETECTED  ? Streptococcus pyogenes NOT DETECTED NOT DETECTED  ? A.calcoaceticus-baumannii NOT DETECTED NOT DETECTED  ? Bacteroides fragilis NOT DETECTED NOT DETECTED  ? Enterobacterales DETECTED (A) NOT DETECTED  ? Enterobacter cloacae complex NOT DETECTED NOT DETECTED  ? Escherichia coli DETECTED (A) NOT DETECTED  ? Klebsiella aerogenes NOT DETECTED NOT DETECTED  ? Klebsiella oxytoca NOT DETECTED NOT DETECTED  ? Klebsiella pneumoniae NOT DETECTED NOT DETECTED  ? Proteus species NOT DETECTED NOT DETECTED  ? Salmonella species NOT DETECTED NOT DETECTED  ? Serratia marcescens NOT  DETECTED NOT DETECTED  ? Haemophilus influenzae NOT DETECTED NOT DETECTED  ? Neisseria meningitidis NOT DETECTED NOT DETECTED  ? Pseudomonas aeruginosa NOT DETECTED NOT DETECTED  ? Stenotrophomonas maltophilia NOT DETECTED NOT DETECTED  ? Candida albicans NOT DETECTED NOT DETECTED  ? Candida auris NOT DETECTED NOT DETECTED  ? Candida glabrata NOT DETECTED NOT DETECTED  ? Candida krusei NOT DETECTED NOT DETECTED  ? Candida parapsilosis NOT DETECTED NOT DETECTED  ? Candida tropicalis NOT DETECTED NOT DETECTED  ? Cryptococcus neoformans/gattii NOT DETECTED NOT DETECTED  ? CTX-M ESBL NOT DETECTED NOT DETECTED  ? Carbapenem resistance IMP NOT DETECTED NOT DETECTED  ? Carbapenem resistance KPC NOT DETECTED NOT DETECTED  ? Carbapenem resistance NDM NOT DETECTED NOT DETECTED  ? Carbapenem resist OXA 48 LIKE NOT DETECTED NOT DETECTED  ? Carbapenem resistance VIM NOT DETECTED NOT DETECTED  ? ? ?Darnelle Bos, PharmD ?08/25/2021  10:07 PM ? ?

## 2021-08-26 ENCOUNTER — Inpatient Hospital Stay: Payer: Medicare Other

## 2021-08-26 ENCOUNTER — Inpatient Hospital Stay
Admit: 2021-08-26 | Discharge: 2021-08-26 | Disposition: A | Discharge status: Home / Self Care | Payer: Medicare Other | Attending: Internal Medicine | Admitting: Internal Medicine

## 2021-08-26 ENCOUNTER — Encounter: Payer: Self-pay | Admitting: Urology

## 2021-08-26 DIAGNOSIS — R319 Hematuria, unspecified: Secondary | ICD-10-CM | POA: Diagnosis not present

## 2021-08-26 DIAGNOSIS — N170 Acute kidney failure with tubular necrosis: Secondary | ICD-10-CM

## 2021-08-26 DIAGNOSIS — A419 Sepsis, unspecified organism: Secondary | ICD-10-CM | POA: Diagnosis not present

## 2021-08-26 DIAGNOSIS — N201 Calculus of ureter: Secondary | ICD-10-CM | POA: Diagnosis not present

## 2021-08-26 DIAGNOSIS — N390 Urinary tract infection, site not specified: Secondary | ICD-10-CM

## 2021-08-26 DIAGNOSIS — A4151 Sepsis due to Escherichia coli [E. coli]: Secondary | ICD-10-CM | POA: Diagnosis not present

## 2021-08-26 DIAGNOSIS — N1330 Unspecified hydronephrosis: Secondary | ICD-10-CM

## 2021-08-26 DIAGNOSIS — R6521 Severe sepsis with septic shock: Secondary | ICD-10-CM

## 2021-08-26 LAB — CBC
HCT: 41.2 % (ref 36.0–46.0)
Hemoglobin: 12.5 g/dL (ref 12.0–15.0)
MCH: 29.3 pg (ref 26.0–34.0)
MCHC: 30.3 g/dL (ref 30.0–36.0)
MCV: 96.5 fL (ref 80.0–100.0)
Platelets: 140 10*3/uL — ABNORMAL LOW (ref 150–400)
RBC: 4.27 MIL/uL (ref 3.87–5.11)
RDW: 15.3 % (ref 11.5–15.5)
WBC: 27.7 10*3/uL — ABNORMAL HIGH (ref 4.0–10.5)
nRBC: 0.1 % (ref 0.0–0.2)

## 2021-08-26 LAB — APTT: aPTT: 66 seconds — ABNORMAL HIGH (ref 24–36)

## 2021-08-26 LAB — TROPONIN I (HIGH SENSITIVITY): Troponin I (High Sensitivity): 379 ng/L (ref <18)

## 2021-08-26 LAB — RENAL FUNCTION PANEL
Albumin: 2.9 g/dL — ABNORMAL LOW (ref 3.5–5.0)
Anion gap: 12 (ref 5–15)
BUN: 57 mg/dL — ABNORMAL HIGH (ref 8–23)
CO2: 21 mmol/L — ABNORMAL LOW (ref 22–32)
Calcium: 7.9 mg/dL — ABNORMAL LOW (ref 8.9–10.3)
Chloride: 105 mmol/L (ref 98–111)
Creatinine, Ser: 3.8 mg/dL — ABNORMAL HIGH (ref 0.44–1.00)
GFR, Estimated: 12 mL/min — ABNORMAL LOW (ref >60)
Glucose, Bld: 200 mg/dL — ABNORMAL HIGH (ref 70–99)
Phosphorus: 4.6 mg/dL (ref 2.5–4.6)
Potassium: 4.6 mmol/L (ref 3.5–5.1)
Sodium: 138 mmol/L (ref 135–145)

## 2021-08-26 LAB — GLUCOSE, CAPILLARY
Glucose-Capillary: 153 mg/dL — ABNORMAL HIGH (ref 70–99)
Glucose-Capillary: 186 mg/dL — ABNORMAL HIGH (ref 70–99)
Glucose-Capillary: 227 mg/dL — ABNORMAL HIGH (ref 70–99)
Glucose-Capillary: 237 mg/dL — ABNORMAL HIGH (ref 70–99)
Glucose-Capillary: 249 mg/dL — ABNORMAL HIGH (ref 70–99)
Glucose-Capillary: 264 mg/dL — ABNORMAL HIGH (ref 70–99)

## 2021-08-26 LAB — PROTIME-INR
INR: 1.9 — ABNORMAL HIGH (ref 0.8–1.2)
Prothrombin Time: 22 seconds — ABNORMAL HIGH (ref 11.4–15.2)

## 2021-08-26 LAB — BASIC METABOLIC PANEL
Anion gap: 10 (ref 5–15)
Anion gap: 15 (ref 5–15)
BUN: 57 mg/dL — ABNORMAL HIGH (ref 8–23)
BUN: 61 mg/dL — ABNORMAL HIGH (ref 8–23)
CO2: 21 mmol/L — ABNORMAL LOW (ref 22–32)
CO2: 20 mmol/L — ABNORMAL LOW (ref 22–32)
Calcium: 8.1 mg/dL — ABNORMAL LOW (ref 8.9–10.3)
Calcium: 8 mg/dL — ABNORMAL LOW (ref 8.9–10.3)
Chloride: 109 mmol/L (ref 98–111)
Chloride: 105 mmol/L (ref 98–111)
Creatinine, Ser: 3.99 mg/dL — ABNORMAL HIGH (ref 0.44–1.00)
Creatinine, Ser: 4.47 mg/dL — ABNORMAL HIGH (ref 0.44–1.00)
GFR, Estimated: 12 mL/min — ABNORMAL LOW (ref >60)
GFR, Estimated: 10 mL/min — ABNORMAL LOW (ref >60)
Glucose, Bld: 264 mg/dL — ABNORMAL HIGH (ref 70–99)
Glucose, Bld: 252 mg/dL — ABNORMAL HIGH (ref 70–99)
Potassium: 5.1 mmol/L (ref 3.5–5.1)
Potassium: 6 mmol/L — ABNORMAL HIGH (ref 3.5–5.1)
Sodium: 140 mmol/L (ref 135–145)
Sodium: 140 mmol/L (ref 135–145)

## 2021-08-26 LAB — BLOOD GAS, ARTERIAL
Acid-base deficit: 9.9 mmol/L — ABNORMAL HIGH (ref 0.0–2.0)
Bicarbonate: 17.2 mmol/L — ABNORMAL LOW (ref 20.0–28.0)
FIO2: 40 %
MECHVT: 460 mL
O2 Saturation: 98.8 %
PEEP: 5 cmH2O
Patient temperature: 37
RATE: 22 resp/min
pCO2 arterial: 41 mmHg (ref 32–48)
pH, Arterial: 7.23 — ABNORMAL LOW (ref 7.35–7.45)
pO2, Arterial: 119 mmHg — ABNORMAL HIGH (ref 83–108)

## 2021-08-26 LAB — HEPARIN LEVEL (UNFRACTIONATED): Heparin Unfractionated: >1.1 IU/mL — ABNORMAL HIGH (ref 0.30–0.70)

## 2021-08-26 LAB — LACTIC ACID, PLASMA
Lactic Acid, Venous: 3.2 mmol/L (ref 0.5–1.9)
Lactic Acid, Venous: 3.2 mmol/L (ref 0.5–1.9)
Lactic Acid, Venous: 3.9 mmol/L (ref 0.5–1.9)

## 2021-08-26 LAB — HIV ANTIBODY (ROUTINE TESTING W REFLEX): HIV Screen 4th Generation wRfx: NONREACTIVE

## 2021-08-26 LAB — PROCALCITONIN: Procalcitonin: >150 ng/mL

## 2021-08-26 LAB — CORTISOL-AM, BLOOD: Cortisol - AM: 72.2 ug/dL — ABNORMAL HIGH (ref 6.7–22.6)

## 2021-08-26 LAB — TRIGLYCERIDES: Triglycerides: 169 mg/dL — ABNORMAL HIGH (ref <150)

## 2021-08-26 LAB — STREP PNEUMONIAE URINARY ANTIGEN: Strep Pneumo Urinary Antigen: NEGATIVE

## 2021-08-26 LAB — BRAIN NATRIURETIC PEPTIDE: B Natriuretic Peptide: 2175 pg/mL — ABNORMAL HIGH (ref 0.0–100.0)

## 2021-08-26 MED ORDER — PRISMASOL BGK 0/2.5 32-2.5 MEQ/L EC SOLN
Status: DC
  Filled 2021-08-26 (×4): qty 5000

## 2021-08-26 MED ORDER — SODIUM CHLORIDE 0.9 % FOR CRRT
INTRAVENOUS_CENTRAL | Status: DC | PRN
  Filled 2021-08-26: qty 1000

## 2021-08-26 MED ORDER — INSULIN ASPART 100 UNIT/ML IV SOLN
5.0000 [IU] | Freq: Once | INTRAVENOUS | Status: AC
  Administered 2021-08-26: 5 [IU] via INTRAVENOUS
  Filled 2021-08-26 (×2): qty 0.05

## 2021-08-26 MED ORDER — DEXTROSE 50 % IV SOLN
25.0000 g | Freq: Once | INTRAVENOUS | Status: AC
  Administered 2021-08-26: 25 g via INTRAVENOUS
  Filled 2021-08-26: qty 50

## 2021-08-26 MED ORDER — HEPARIN SODIUM (PORCINE) 1000 UNIT/ML DIALYSIS
1000.0000 [IU] | INTRAMUSCULAR | Status: DC | PRN
  Administered 2021-08-29: 2800 [IU] via INTRAVENOUS_CENTRAL
  Filled 2021-08-26 (×3): qty 6
  Filled 2021-08-26: qty 3

## 2021-08-26 MED ORDER — PRISMASOL BGK 0/2.5 32-2.5 MEQ/L EC SOLN
Status: DC
  Filled 2021-08-26 (×19): qty 5000

## 2021-08-26 MED ORDER — PERFLUTREN LIPID MICROSPHERE
1.0000 mL | INTRAVENOUS | Status: AC | PRN
  Administered 2021-08-26: 2 mL via INTRAVENOUS
  Filled 2021-08-26: qty 10

## 2021-08-26 MED ORDER — NOREPINEPHRINE 16 MG/250ML-% IV SOLN
0.0000 ug/min | INTRAVENOUS | Status: DC
  Administered 2021-08-26: 30 ug/min via INTRAVENOUS
  Administered 2021-08-26: 25 ug/min via INTRAVENOUS
  Administered 2021-08-27: 16 ug/min via INTRAVENOUS
  Filled 2021-08-26 (×4): qty 250

## 2021-08-26 MED ORDER — HYDROCORTISONE SOD SUC (PF) 100 MG IJ SOLR
100.0000 mg | Freq: Two times a day (BID) | INTRAMUSCULAR | Status: AC
  Administered 2021-08-26 – 2021-08-28 (×6): 100 mg via INTRAVENOUS
  Filled 2021-08-26 (×6): qty 2

## 2021-08-26 MED ORDER — SODIUM CHLORIDE 0.9% FLUSH
10.0000 mL | INTRAVENOUS | Status: DC | PRN
  Administered 2021-09-02: 09:00:00 10 mL

## 2021-08-26 MED ORDER — DEXMEDETOMIDINE HCL IN NACL 400 MCG/100ML IV SOLN
INTRAVENOUS | Status: AC
  Filled 2021-08-26: qty 100

## 2021-08-26 MED ORDER — INSULIN ASPART 100 UNIT/ML IJ SOLN
4.0000 [IU] | INTRAMUSCULAR | Status: DC
  Filled 2021-08-26: qty 1

## 2021-08-26 MED ORDER — SODIUM CHLORIDE 0.9% FLUSH
10.0000 mL | Freq: Two times a day (BID) | INTRAVENOUS | Status: DC
  Administered 2021-08-26 – 2021-08-30 (×6): 10 mL
  Administered 2021-08-31: 20 mL
  Administered 2021-08-31 – 2021-09-01 (×2): 10 mL
  Administered 2021-09-01: 22:00:00 30 mL
  Administered 2021-09-02 – 2021-09-04 (×5): 10 mL
  Administered 2021-09-04: 40 mL
  Administered 2021-09-05 – 2021-09-06 (×3): 10 mL

## 2021-08-26 MED ORDER — HEPARIN (PORCINE) 25000 UT/250ML-% IV SOLN
1800.0000 [IU]/h | INTRAVENOUS | Status: AC
  Administered 2021-08-26: 08:00:00 1400 [IU]/h via INTRAVENOUS
  Administered 2021-08-27: 09:00:00 1200 [IU]/h via INTRAVENOUS
  Administered 2021-08-28: 03:00:00 1700 [IU]/h via INTRAVENOUS
  Administered 2021-08-28: 15:00:00 1900 [IU]/h via INTRAVENOUS
  Administered 2021-08-29: 17:00:00 1800 [IU]/h via INTRAVENOUS
  Administered 2021-08-29: 04:00:00 1900 [IU]/h via INTRAVENOUS
  Administered 2021-08-30 – 2021-09-03 (×7): 1800 [IU]/h via INTRAVENOUS
  Filled 2021-08-26 (×14): qty 250

## 2021-08-26 MED ORDER — HEPARIN BOLUS VIA INFUSION
5000.0000 [IU] | Freq: Once | INTRAVENOUS | Status: AC
  Administered 2021-08-26: 5000 [IU] via INTRAVENOUS
  Filled 2021-08-26: qty 5000

## 2021-08-26 MED ORDER — VASOPRESSIN 20 UNITS/100 ML INFUSION FOR SHOCK
0.0000 [IU]/min | INTRAVENOUS | Status: DC
  Administered 2021-08-26 – 2021-08-27 (×4): 0.03 [IU]/min via INTRAVENOUS
  Filled 2021-08-26 (×6): qty 100

## 2021-08-26 NOTE — Progress Notes (Signed)
Late entry ?Patient was transferred to the ICU post bilateral ureteral stent placement.  She was intubated prior to procedure and was started on pressors for septic shock. ?Consulted intensivist, Dr Mortimer Fries for ICU transfer. ?

## 2021-08-26 NOTE — Consult Note (Signed)
ANTICOAGULATION CONSULT NOTE - Initial Consult ? ?Pharmacy Consult for Heparin gtt (PTA Eliquis) ?Indication: atrial fibrillation ? ?Allergies  ?Allergen Reactions  ? Tetanus Toxoid, Adsorbed Swelling  ? Tetanus Toxoid   ?  Other reaction(s): Unknown  ? Tetanus Toxoids Swelling  ? Lisinopril Rash  ?  Other reaction(s): Unknown  ? Niacin Rash  ?  Other reaction(s): Unknown  ? Sitagliptin Rash  ?  Other reaction(s): Unknown  ? Sulfamethoxazole-Trimethoprim Rash  ?  Other reaction(s): Unknown  ? ? ?Patient Measurements: ?Height: 5\' 5"  (165.1 cm) ?Weight: (!) 155.4 kg (342 lb 9.5 oz) ?IBW/kg (Calculated) : 57 ?Heparin Dosing Weight: 96.5kg ? ?Vital Signs: ?Temp: 100.2 ?F (37.9 ?C) (03/03 0400) ?Temp Source: Axillary (03/03 0400) ?BP: 105/65 (03/03 1610) ?Pulse Rate: 59 (03/03 0620) ? ?Labs: ?Recent Labs  ?  08/25/21 ?1128 08/25/21 ?1229 08/25/21 ?1939 08/25/21 ?2111 08/26/21 ?0005 08/26/21 ?0330  ?HGB 11.8*  --   --  12.3  --  12.5  ?HCT 37.5  --   --  39.3  --  41.2  ?PLT 120*  --   --  128*  --  140*  ?APTT  --  30  --   --   --   --   ?LABPROT 18.5*  --   --   --   --  22.0*  ?INR 1.6*  --   --   --   --  1.9*  ?CREATININE 2.31*  --  3.12*  --   --  3.99*  ?TROPONINIHS  --   --  420* 403* 379*  --   ? ? ?Estimated Creatinine Clearance: 20 mL/min (A) (by C-G formula based on SCr of 3.99 mg/dL (H)). ? ? ?Medical History: ?Past Medical History:  ?Diagnosis Date  ? Bacteremia   ? CAD (coronary artery disease)   ? Diabetes mellitus without complication ([place])   ? Diastolic CHF ([place])   ? Hyperlipemia   ? Hypertension   ? Lymphedema   ? MI (myocardial infarction) ([place])   ? OSA (obstructive sleep apnea)   ? Severe aortic stenosis   ? ? ?Medications: No AC/APT pertinent drug allergies ?Heparin Dosing Weight: 96.5kg ?PTA: Apixaban 5mg  BID (last dose on 3/02 AM PTA >> admitted on 3/02 - 1344) ?Inpatient: Apixaban (stopped w/o receiving any doses) >> Heparin gtt. ? ?Assessment: ?71yo F w/ h/o  HFpEF, severe AS (s/p TAVR), morbid  obesity (BMI 57), obesity hypoventilation syndrome, OSA, DM, CAD, & HTN who presented to [facility] ED on 08/25/2021 due to complaints of altered mental 2/2 severe urosepsis and septic shock ISO Rt ureteral stone, AKI, ac metabolic enceph. Pharmacy consulted for conversion of DOAC to Heparin gtt. ? ?Date Time aPTT/HL Rate/Comment ?     ? ?Baseline Labs: ?aPTT - 30s ?INR - 1.6>1.9 (likely eliquis and underlying AKI at present.) ?Hgb - 11.8>12.5 ?Plts - 120>140 ?Trop 960>454 ? ? ?Goal of Therapy:  ?Heparin level 0.3-0.7 units/ml ?aPTT 66-102 seconds ?Monitor platelets by anticoagulation protocol: Yes ?  ?Plan:  ?Historically therapeutic ~1400 un/hr on earlier admissions w/ similar dosing weight. ?Give 5000 units bolus x1; then start heparin infusion at 1400 units/hr ?Check aPTT/Anti-Xa level in 8hours and daily once consecutively therapeutic.  ?Titrate by aPTT's until lab correlation is noted, then titrate by anti-xa alone. ?Continue to monitor H&H and platelets daily while on heparin gtt. ? ?Lorna Dibble, PharmD, BCCP ?Clinical Pharmacist ?08/26/2021 7:48 AM ? ? ? ?

## 2021-08-26 NOTE — Progress Notes (Addendum)
CRRT started with no complications. Pressure applied on puncture site as still oozing, will continue to monitor. Interdry applied to skin folds on abdomen, under the breasts. Consent for CRRT, Line insertion obtained over the phone obtain with Dr Juleen China. ?

## 2021-08-26 NOTE — Progress Notes (Signed)
? ?  Subjective ?Intubated and sedated in ICU, on multiple pressors, remains critically ill ? ?Physical Exam: ?BP 105/65   Pulse (!) 59   Temp 100.2 ?F (37.9 ?C) (Axillary)   Resp 15   Ht 5\' 5"  (1.651 m)   Wt (!) 155.4 kg   SpO2 98%   BMI 57.01 kg/m?   ? ?Constitutional: Intubated and sedated ?Drains: Foley with minimal dark amber urine ? ?Laboratory Data: ?Reviewed in epic ? ?Assessment & Plan:   ?[name] is a 71 y.o. year old extremely comorbid female with morbid obesity(BMI 38), diabetes, CAD, sleep apnea, hypertension who presented 08/25/2021 with altered mental status and right-sided groin pain, with clinical picture consistent with sepsis from urinary source and CT showing right proximal ureteral stone with hydronephrosis as well as a left UPJ stone without hydronephrosis. POD#1 cystoscopy and bilateral ureteral stent placement.  Remains critically ill in ICU. ? ?Agree with continuing antibiotics and resuscitation, appreciate ICU/hospitalist care ?Would maintain Foley until clinically improving ?If recovers, will ultimately need outpatient bilateral ureteroscopy/laser lithotripsy in 1-2 months ? ? ?Billey Co, MD ? ? ? ?

## 2021-08-26 NOTE — Procedures (Signed)
Central Venous Catheter Insertion Procedure Note ? ?[name]  ?[id number]  ?20-Sep-1950 ? ?Date:08/26/21  ?Time:3:10 PM  ? ?Provider Performing:Ayrianna Mcginniss Sharene Butters  ? ?Procedure: Insertion of Non-tunneled Central Venous Catheter(36556) with US guidance (58850)  ? ?Indication(s) ?Hemodialysis ? ?Consent ?Unable to obtain consent due to inability to find a medical decision maker for patient.  All reasonable efforts were made.  Another independent medical provider, Dr. Juleen China , confirmed the benefits of this procedure outweigh the risks. ? ?Anesthesia ?Topical only with 1% lidocaine  ? ?Timeout ?Verified patient identification, verified procedure, site/side was marked, verified correct patient position, special equipment/implants available, medications/allergies/relevant history reviewed, required imaging and test results available. ? ?Sterile Technique ?Maximal sterile technique including full sterile barrier drape, hand hygiene, sterile gown, sterile gloves, mask, hair covering, sterile ultrasound probe cover (if used). ? ?Procedure Description ?Area of catheter insertion was cleaned with chlorhexidine and draped in sterile fashion.  With real-time ultrasound guidance a HD catheter was placed into the left internal jugular vein. Nonpulsatile blood flow and easy flushing noted in all ports.  The catheter was sutured in place and sterile dressing applied. ? ?Complications/Tolerance ?None; patient tolerated the procedure well. ?Chest X-ray is ordered to verify placement for internal jugular or subclavian cannulation.   Chest x-ray is not ordered for femoral cannulation. ? ?EBL ?Minimal ? ?Specimen(s) ?None ? ?Bennie Pierini, MD 08/26/21 3:11 PM   ?

## 2021-08-26 NOTE — Progress Notes (Signed)
Heparin infusion stopped 1330 for arterial and Trialysis line placement.  ?

## 2021-08-26 NOTE — Progress Notes (Signed)
Spoke to the provider on call regarding patients decreased blood pressure while provider rounded on patient. [name] NP stated patient's MAP goal is to maintain a MAP of 60 or greater. Provider gave verbal orders to titrate Levophed down to maintain a MAP of 60. Also spoke to provider regarding patient's decreased UOP. Patient has had a very scant amount of UOP. Bladder scan obtained and showed 14 ml's of urine to be present in patient's bladder. Order received to obtain a CVP. CVP was 19 and reported to provider.  ?

## 2021-08-26 NOTE — Procedures (Signed)
Arterial Catheter Insertion Procedure Note ? ?Vicy Medico Beedy  ?[id number]  ?1951/04/13 ? ?Date:08/26/21  ?Time:3:09 PM  ? ? ?Provider Performing: Bennie Pierini  ? ? ?Procedure: Insertion of Arterial Line [phone]) with US guidance ([id number])  ? ?Indication(s) ?Blood pressure monitoring and/or need for frequent ABGs ? ?Consent ?Risks of the procedure as well as the alternatives and risks of each were explained to the patient and/or caregiver.  Consent for the procedure was obtained and is signed in the bedside chart ? ?Anesthesia ?None ? ? ?Time Out ?Verified patient identification, verified procedure, site/side was marked, verified correct patient position, special equipment/implants available, medications/allergies/relevant history reviewed, required imaging and test results available. ? ? ?Sterile Technique ?Maximal sterile technique including full sterile barrier drape, hand hygiene, sterile gown, sterile gloves, mask, hair covering, sterile ultrasound probe cover (if used). ? ? ?Procedure Description ?Area of catheter insertion was cleaned with chlorhexidine and draped in sterile fashion. With real-time ultrasound guidance an arterial catheter was placed into the left radial artery.  Appropriate arterial tracings confirmed on monitor.   ? ? ?Complications/Tolerance ?Unable to access artery. Right radial artery previously reviewed and deemed unlikely for success. Procedure aborted. ? ? ?EBL ?Minimal ? ? ?Specimen(s) ?None ? ?Bennie Pierini, MD 08/26/21 3:10 PM   ?

## 2021-08-26 NOTE — Consult Note (Addendum)
ANTICOAGULATION CONSULT NOTE - Initial Consult ? ?Pharmacy Consult for Heparin gtt (PTA Eliquis) ?Indication: atrial fibrillation ? ?Allergies  ?Allergen Reactions  ? Tetanus Toxoid, Adsorbed Swelling  ? Tetanus Toxoid   ?  Other reaction(s): Unknown  ? Tetanus Toxoids Swelling  ? Lisinopril Rash  ?  Other reaction(s): Unknown  ? Niacin Rash  ?  Other reaction(s): Unknown  ? Sitagliptin Rash  ?  Other reaction(s): Unknown  ? Sulfamethoxazole-Trimethoprim Rash  ?  Other reaction(s): Unknown  ? ? ?Patient Measurements: ?Height: 5\' 5"  (165.1 cm) ?Weight: (!) 155.4 kg (342 lb 9.5 oz) ?IBW/kg (Calculated) : 57 ?Heparin Dosing Weight: 96.5kg ? ?Vital Signs: ?Temp: 102.4 ?F (39.1 ?C) (03/03 1500) ?Temp Source: Oral (03/03 1500) ?BP: 133/70 (03/03 1500) ?Pulse Rate: 63 (03/03 1500) ? ?Labs: ?Recent Labs  ?  08/25/21 ?1128 08/25/21 ?1229 08/25/21 ?1939 08/25/21 ?2111 08/26/21 ?0005 08/26/21 ?0330 08/26/21 ?1310 08/26/21 ?1530  ?HGB 11.8*  --   --  12.3  --  12.5  --   --   ?HCT 37.5  --   --  39.3  --  41.2  --   --   ?PLT 120*  --   --  128*  --  140*  --   --   ?APTT  --  30  --   --   --   --   --  66*  ?LABPROT 18.5*  --   --   --   --  22.0*  --   --   ?INR 1.6*  --   --   --   --  1.9*  --   --   ?HEPARINUNFRC  --   --   --   --   --   --   --  >1.10*  ?CREATININE 2.31*  --  3.12*  --   --  3.99* 4.47*  --   ?TROPONINIHS  --   --  420* 403* 379*  --   --   --   ? ? ? ?Estimated Creatinine Clearance: 17.8 mL/min (A) (by C-G formula based on SCr of 4.47 mg/dL (H)). ? ? ?Medical History: ?Past Medical History:  ?Diagnosis Date  ? Bacteremia   ? CAD (coronary artery disease)   ? Diabetes mellitus without complication ([place])   ? Diastolic CHF ([place])   ? Hyperlipemia   ? Hypertension   ? Lymphedema   ? MI (myocardial infarction) ([place])   ? OSA (obstructive sleep apnea)   ? Severe aortic stenosis   ? ? ?Medications: No AC/APT pertinent drug allergies ?Heparin Dosing Weight: 96.5kg ?PTA: Apixaban 5mg  BID (last dose on 3/02 AM PTA  >> admitted on 3/02 - 1344) ?Inpatient: Apixaban (stopped w/o receiving any doses) >> Heparin gtt. ? ?Assessment: ?71yo F w/ h/o  HFpEF, severe AS (s/p TAVR), morbid obesity (BMI 57), obesity hypoventilation syndrome, OSA, DM, CAD, & HTN who presented to [facility] ED on 08/25/2021 due to complaints of altered mental 2/2 severe urosepsis and septic shock ISO Rt ureteral stone, AKI, ac metabolic enceph. Pharmacy consulted for conversion of DOAC to Heparin gtt. ?    ? ?Baseline Labs: ?aPTT - 30s ?INR - 1.6>1.9 (likely eliquis and underlying AKI at present.) ?Hgb - 11.8>12.5 ?Plts - 120>140 ?Trop 397>673 ? ?Date Time aPTT/HL Rate/Comment ?3/3 1530 Aptt 66  Therapeutic x 1 (heparin infusion off since 1330 for procedure) Okay to resume per Dr Jonnie Finner ? ?Goal of Therapy:  ?Heparin level 0.3-0.7 units/ml ?aPTT 66-102 seconds ?Monitor platelets  by anticoagulation protocol: Yes ?  ?Plan:  ?aPTT at therapeutic level and no correlating with HL yet. Infusion on HOLD since 1330. CRRT initiated. Okay to resume heparin infusion per Dr Jonnie Finner. ?Resume heparin infusion at 1400 units/hr. No bolus at this time. ?Check aPTT in 8hours and daily once consecutively therapeutic.  ?Titrate by aPTT's until lab correlation is noted, then titrate by anti-xa alone. ?Continue to monitor H&H and platelets daily while on heparin gtt. ? ?Iridian Reader Rodriguez-Guzman PharmD, BCPS ?08/26/2021 4:19 PM ? ? ? ? ?

## 2021-08-26 NOTE — TOC Initial Note (Signed)
Transition of Care (TOC) - Initial/Assessment Note  ? ? ?Patient Details  ?Name: [name] ?MRN: [id number] ?Date of Birth: [birth year] ? ?Transition of Care (TOC) CM/SW Contact:    ?Shelbie Hutching, RN ?Phone Number: ?08/26/2021, 11:43 AM ? ?Clinical Narrative:                 ?Patient admitted to the hospital with sepsis from UTI, ureteral stone- SP ureteral stent placement.  Patient is currently in the ICU intubated and sedated, requiring vaso pressors to maintain BP.   ?RNCM called emergency contact person, Cleda Clarks, introduced self and explained role.  [name] is patient's first cousin and closest living relative.  Patient has never been married or had children and all her brothers and sisters are deceased.   ?Patient has never driven, she lives alone in apartment, she has PCS aide that comes out 5 times per week for 2 hours at a time to help with cooking, cleaning and transportation.  PCS company is [employer].  Patient has home health through [facility] for wound care to both legs 2 times per week.  Malachy Mood with [facility] notified of admission to hospital. ? ?[name] helps get patient to appointments, PCP is Dr. Rebeca Alert at [facility].  Patient gets prescriptions from [facility] [address].   ?Patient has a walker and rollator at home that she uses.  She has been to SNF in the past [facility] and [facility].   ? ?TOC will follow.  ? ?Expected Discharge Plan:  (TBD) ?Barriers to Discharge: Continued Medical Work up ? ? ?Patient Goals and CMS Choice ?Patient states their goals for this hospitalization and ongoing recovery are:: Patient unable to state, intubated and sedated ?  ?  ? ?Expected Discharge Plan and Services ?Expected Discharge Plan:  (TBD) ?  ?Discharge Planning Services: CM Consult ?  ?Living arrangements for the past 2 months: Apartment ?                ?DME Arranged: N/A ?DME Agency: NA ?  ?  ?  ?HH Arranged: RN ?[place] Agency: [employer] ?Date HH Agency  Contacted: 08/26/21 ?Time Marshall: [id number] ?  ? ?Prior Living Arrangements/Services ?Living arrangements for the past 2 months: Apartment ?Lives with:: Self ?Patient language and need for interpreter reviewed:: Yes ?Do you feel safe going back to the place where you live?: Yes      ?Need for Family Participation in Patient Care: Yes (Comment) ?Care giver support system in place?: Yes (comment) (cousin and PCS services) ?Current home services: DME, Homehealth aide, Home RN (walker, rollator- PCS 5 x per week, HHRN) ?Criminal Activity/Legal Involvement Pertinent to Current Situation/Hospitalization: No - Comment as needed ? ?Activities of Daily Living ?Home Assistive Devices/Equipment: None ?ADL Screening (condition at time of admission) ?Patient's cognitive ability adequate to safely complete daily activities?: No ?Is the patient deaf or have difficulty hearing?: No ?Does the patient have difficulty seeing, even when wearing glasses/contacts?: No ?Does the patient have difficulty concentrating, remembering, or making decisions?: Yes ?Patient able to express need for assistance with ADLs?: No ?Does the patient have difficulty dressing or bathing?: Yes ?Independently performs ADLs?: No ?Communication: Dependent ?Is this a change from baseline?: Change from baseline, expected to last <3 days ?Dressing (OT): Dependent ?Is this a change from baseline?: Change from baseline, expected to last <3days ?Grooming: Dependent ?Is this a change from baseline?: Change from baseline, expected to last <3 days ?Feeding: Dependent ?Is  this a change from baseline?: Change from baseline, expected to last <3 days ?Bathing: Dependent ?Is this a change from baseline?: Change from baseline, expected to last <3 days ?Toileting: Dependent ?Is this a change from baseline?: Change from baseline, expected to last <3 days ?In/Out Bed: Dependent ?Is this a change from baseline?: Change from baseline, expected to last <3 days ?Walks in  Home: Dependent ?Is this a change from baseline?: Change from baseline, expected to last <3 days ?Does the patient have difficulty walking or climbing stairs?: Yes ?Weakness of Legs: Both ?Weakness of Arms/Hands: Both ? ?Permission Sought/Granted ?Permission sought to share information with : Case Manager, Family Supports, Other (comment) ?Permission granted to share information with : Yes, Verbal Permission Granted ? Share Information with NAME: Cleda Clarks- 1st cousin ? Permission granted to share info w AGENCY: [place] [employer] ? Permission granted to share info w Relationship: cousin ? Permission granted to share info w Contact Information: [phone] ? ?Emotional Assessment ?Appearance:: Appears stated age ?Attitude/Demeanor/Rapport: Intubated (Following Commands or Not Following Commands) ?Affect (typically observed): Unable to Assess ?  ?Alcohol / Substance Use: Not Applicable ?Psych Involvement: No (comment) ? ?Admission diagnosis:  Renal colic [T61] ?Sepsis ([place]) [A41.9] ?Urinary tract infection with hematuria, site unspecified [N39.0, R31.9] ?Community acquired pneumonia, unspecified laterality [J18.9] ?Sepsis, due to unspecified organism, unspecified whether acute organ dysfunction present ([place]) [A41.9] ?Patient Active Problem List  ? Diagnosis Date Noted  ? Obstructive uropathy 08/25/2021  ? CAD (coronary artery disease)   ? Diabetes mellitus without complication ([place])   ? Acute metabolic encephalopathy   ? AKI (acute kidney injury) ([place])   ? Paroxysmal atrial fibrillation ([facility])   ? Right ureteral stone   ? Sepsis ([place]) 11/02/2017  ? Wide-complex tachycardia 07/11/2017  ? Aortic stenosis 05/21/2017  ? Syncope, near 05/19/2017  ? Chronic diastolic heart failure ([place]) 07/27/2016  ? Hypertension 07/27/2016  ? Obstructive sleep apnea 07/27/2016  ? Cellulitis 07/27/2016  ? Pressure injury of skin 05/02/2016  ? Lactic acidosis   ? Severe aortic stenosis   ? OSA on CPAP   ? Morbid obesity ([place])    ? Community acquired pneumonia   ? ?PCP:  Letta Median, MD ?Pharmacy:   ?[facility] ([facility]), [place] [facility] [place] ?[address] [place] [address] ?Phone: [phone] Fax: [phone] ? ? ? ? ?Social Determinants of Health (SDOH) Interventions ?  ? ?Readmission Risk Interventions ?No flowsheet data found. ? ? ?

## 2021-08-26 NOTE — Progress Notes (Addendum)
Inpatient Diabetes Program Recommendations ? ?AACE/ADA: New Consensus Statement on Inpatient Glycemic Control (2015) ? ?Target Ranges:  Prepandial:   less than 140 mg/dL ?     Peak postprandial:   less than 180 mg/dL (1-2 hours) ?     Critically ill patients:  140 - 180 mg/dL  ? ? Latest Reference Range & Units 08/25/21 11:48 08/25/21 16:05 08/25/21 18:35 08/25/21 19:39 08/25/21 23:22 08/26/21 03:56 08/26/21 07:45  ?Glucose-Capillary 70 - 99 mg/dL 167 (H) 173 (H) 166 (H) 191 (H) 201 (H) ? ?7 units Novolog ? 249 (H) ? ?7 units Novolog ? 237 (H) ? ?7 units Novolog ?  ?(H): Data is abnormally high ? ? ?Admit with:  ?Severe Urosepsis with Septic Shock in the setting of Right proximal ureteral stone with Hydronephrosis & large left renal stone, Acute Kidney Injury, and Acute Metabolic Encephalopathy ? ?History: DM ? ?Home DM Meds: Novolog 30 units daily ?       Basaglar 70 units daily ?       Metformin 1000 mg daily ? ?Current Orders: Novolog Resistant Correction Scale/ SSI (0-20 units) Q4 hours ?    Novolog 4 units Q4 hours ? ? ? ?Just started Solucortef 100 mg BID this AM ? ? ?MD- Note patient getting Novolog 4 units Q4 hours (in addition to Novolog SSI Q4 hours) ? ?Do not see that pt is getting tube feedings-- ? ?Please consider: ? ?1. Stop Novolog 4 units Q4 hours for now--can resume once/if tube feedings are started ? ?2. Start Semglee 18 units BID (50% home dose of basal insulin--Takes Basaglar 70 units daily at home) ? ? ? ?--Will follow patient during hospitalization-- ? ?Wyn Quaker RN, MSN, CDE ?Diabetes Coordinator ?Inpatient Glycemic Control Team ?Team Pager: [phone] (8a-5p) ? ? ?

## 2021-08-26 NOTE — Progress Notes (Signed)
Initial Nutrition Assessment ? ?DOCUMENTATION CODES:  ? ?Morbid obesity ? ?INTERVENTION:  ? ?Once appropriate for tube feeds, recommend: ? ?Vital HP @60ml /hr- Initiate at 38ml/hr and increase by 27ml/hr q 8 hours until goal rate is reached.  ? ?Pro-Source 64ml BID via tube, provides 40kcal and 11g of protein per serving  ? ?Free water flushes 21ml q4 hours to maintain tube patency  ? ?Regimen provides 1520kcal/day, 148g/day protein and 1349ml/day of free water  ? ?NUTRITION DIAGNOSIS:  ? ?Inadequate oral intake related to inability to eat (pt sedated and ventilated) as evidenced by NPO status. ? ?GOAL:  ? ?Provide needs based on ASPEN/SCCM guidelines ? ?MONITOR:  ? ?Vent status, Labs, Weight trends, Skin, I & O's ? ?REASON FOR ASSESSMENT:  ? ?Ventilator ?  ? ?ASSESSMENT:  ? ?71 y/o female with h/o HTN, DM, CAD, HLD, CHF, Afib and s/p TAVR who is admitted with obstructing ureteral stone, AKI and sepsis. Pt s/p cystoscopy and bilateral ureteral stent placement 3/2. ? ?Pt sedated and ventilated. OGT in place. Will hold on tube feeds today pr MD as pt with increasing pressor requirements. Per chart, pt appears weight stable at baseline.  ? ?Medications reviewed and include: colace, solu-cortef, insulin, protonix, miralax, ceftriaxone, heparin, levophed, vasopressin  ? ?Labs reviewed: K 5.1 wnl, BUN 57(H), creat 3.99(H) ?Wbc- 27.7(H) ?Cbgs- 227, 237, 249 x 24 hrs ? ?Patient is currently intubated on ventilator support ?MV: 9.6 L/min ?Temp (24hrs), Avg:99.7 ?F (37.6 ?C), Min:98.2 ?F (36.8 ?C), Max:103 ?F (39.4 ?C) ? ?Propofol: none  ? ?MAP- >34mmHg  ? ?UOP- 10ml  ? ?NUTRITION - FOCUSED PHYSICAL EXAM: ? ?Flowsheet Row Most Recent Value  ?Orbital Region No depletion  ?Upper Arm Region No depletion  ?Thoracic and Lumbar Region No depletion  ?Buccal Region No depletion  ?Temple Region No depletion  ?Clavicle Bone Region No depletion  ?Clavicle and Acromion Bone Region No depletion  ?Scapular Bone Region No depletion  ?Dorsal  Hand No depletion  ?Patellar Region No depletion  ?Anterior Thigh Region No depletion  ?Posterior Calf Region No depletion  ?Edema (RD Assessment) Mild  ?Hair Reviewed  ?Eyes Reviewed  ?Mouth Reviewed  ?Skin Reviewed  ?Nails Reviewed  ? ?Diet Order:   ?Diet Order   ? ?       ?  Diet NPO time specified  Diet effective now       ?  ? ?  ?  ? ?  ? ?EDUCATION NEEDS:  ? ?No education needs have been identified at this time ? ?Skin:  Skin Assessment: Reviewed RN Assessment (MASD, incision perineum) ? ?Last BM:  pta ? ?Height:  ? ?Ht Readings from Last 1 Encounters:  ?08/26/21 5\' 5"  (1.651 m)  ? ? ?Weight:  ? ?Wt Readings from Last 1 Encounters:  ?08/25/21 (!) 155.4 kg  ? ? ?Ideal Body Weight:  56.8 kg ? ?BMI:  Body mass index is 57.01 kg/m?. ? ?Estimated Nutritional Needs:  ? ?Kcal:  1250-1420kcal/day ? ?Protein:  >142g/day ? ?Fluid:  1.4-1.7L/day ? ?Koleen Distance MS, RD, LDN ?Please refer to [facility] for RD and/or RD on-call/weekend/after hours pager ? ?

## 2021-08-26 NOTE — Consult Note (Signed)
[facility]  CONSULT NOTE    Date: 08/26/2021                  Patient Name:  [name]  MRN: [id number]  DOB: 1950-07-09  Age / Sex: 71 y.o., female         PCP: Letta Median, MD                 Service Requesting Consult: Dr. Jonnie Finner                 Reason for Consult: Acute Kidney Injury            History of Present Illness: Ms. Jelisha Weed Mckiver admitted on 3/1 for altered mental status. Patient was found to have hypotension and was started on IV fluids. Found to be septic. She was found to have obstruction with two 42m stones on right and left with 1cm stone with hydronephrosis.   On 3/2, patient underwent cystoscopy with bilateral ureteral stents placed by Dr. SDiamantina Providence Patient was found to have purulent urine in bladder.   Taken to ICU. Placed on broad spectrum antibiotics. Placed on vasopressors.   Nephrology consulted for anuric urine output.   Medications: Outpatient medications: Medications Prior to Admission  Medication Sig Dispense Refill Last Dose   acetaminophen (TYLENOL) 325 MG tablet Take 2 tablets (650 mg total) by mouth every 6 (six) hours as needed for mild pain (or Fever >/= 101).   08/25/2021   amLODipine (NORVASC) 5 MG tablet Take 5 mg by mouth daily.   08/25/2021   apixaban (ELIQUIS) 5 MG TABS tablet Take 1 tablet by mouth 2 (two) times daily.   08/25/2021   atorvastatin (LIPITOR) 40 MG tablet Take 1 tablet (40 mg total) by mouth daily at 6 PM. 30 tablet 0 08/24/2021   cefpodoxime (VANTIN) 100 MG tablet Take 1 tablet (100 mg total) by mouth 2 (two) times daily. 14 tablet 0 08/25/2021   Cholecalciferol 50 MCG (2000 UT) CAPS Take 1 capsule by mouth daily.   08/25/2021   docusate sodium (COLACE) 100 MG capsule Take 1 capsule (100 mg total) by mouth daily.   Past Week   fenofibrate (TRICOR) 145 MG tablet Take 1 tablet by mouth daily.   08/25/2021   furosemide (LASIX) 40 MG tablet Take 1 tablet (40 mg total) by mouth daily. 30 tablet 0  08/25/2021   insulin aspart (NOVOLOG FLEXPEN) 100 UNIT/ML FlexPen Inject 30 Units into the skin daily.   08/25/2021   Insulin Glargine (BASAGLAR KWIKPEN) 100 UNIT/ML Inject 70 Units into the skin daily.   08/25/2021   metFORMIN (GLUCOPHAGE) 1000 MG tablet Take 1 tablet (1,000 mg total) by mouth daily with breakfast.   08/25/2021   metoprolol tartrate (LOPRESSOR) 25 MG tablet Take 1 tablet (25 mg total) by mouth 2 (two) times daily. 60 tablet 0 08/25/2021   Potassium Chloride ER 20 MEQ TBCR Take 1 tablet by mouth daily.   08/25/2021   amiodarone (PACERONE) 200 MG tablet Take 1 tablet (200 mg total) by mouth daily. (Patient not taking: Reported on 08/25/2021)   Not Taking   BD PEN NEEDLE NANO 2ND GEN 32G X 4 MM MISC 4 (four) times daily. use as directed      tamsulosin (FLOMAX) 0.4 MG CAPS capsule Take 1 capsule (0.4 mg total) by mouth daily. (Patient not taking: Reported on 08/25/2021) 14 capsule 0 Not Taking    Current medications: Current Facility-Administered Medications  Medication  Dose Route Frequency Provider Last Rate Last Admin   acetaminophen (TYLENOL) tablet 650 mg  650 mg Oral Q6H PRN Agbata, Tochukwu, MD       cefTRIAXone (ROCEPHIN) 2 g in sodium chloride 0.9 % 100 mL IVPB  2 g Intravenous Q24H Rauer, Forde Dandy, RPH   Stopped at 08/25/21 2326   chlorhexidine gluconate (MEDLINE KIT) (PERIDEX) 0.12 % solution 15 mL  15 mL Mouth Rinse BID Agbata, Tochukwu, MD   15 mL at 08/26/21 0743   Chlorhexidine Gluconate Cloth 2 % PADS 6 each  6 each Topical Q0600 Agbata, Tochukwu, MD   6 each at 08/25/21 1834   dexmedetomidine (PRECEDEX) 400 MCG/100ML (4 mcg/mL) infusion            docusate (COLACE) 50 MG/5ML liquid 100 mg  100 mg Per Tube BID Darel Hong D, NP   100 mg at 08/26/21 1138   fentaNYL (SUBLIMAZE) bolus via infusion 25-100 mcg  25-100 mcg Intravenous Q15 min PRN Bradly Bienenstock, NP   50 mcg at 08/25/21 2217   fentaNYL 2567mg in NS 2531m(1052mml) infusion-PREMIX  25-200 mcg/hr Intravenous  Continuous KeeDarel Hong NP 17.5 mL/hr at 08/26/21 1300 175 mcg/hr at 08/26/21 1300   heparin ADULT infusion 100 units/mL (25000 units/250m9m1,400 Units/hr Intravenous Continuous BeerLorna DibbleH 14 mL/hr at 08/26/21 1300 1,400 Units/hr at 08/26/21 1300   hydrocortisone sodium succinate (SOLU-CORTEF) 100 MG injection 100 mg  100 mg Intravenous Q12H ScheBennie Pierini   100 mg at 08/26/21 0741   insulin aspart (novoLOG) injection 0-20 Units  0-20 Units Subcutaneous Q4H Agbata, Tochukwu, MD   7 Units at 08/26/21 1137   insulin aspart (novoLOG) injection 4 Units  4 Units Subcutaneous Q4H ScheBennie Pierini       ipratropium-albuterol (DUONEB) 0.5-2.5 (3) MG/3ML nebulizer solution 3 mL  3 mL Nebulization Q4H PRN KeenDarel HongNP       MEDLINE mouth rinse  15 mL Mouth Rinse 10 times per day Agbata, Tochukwu, MD   15 mL at 08/26/21 1137   midazolam (VERSED) 100 mg/100 mL (1 mg/mL) premix infusion  0-5 mg/hr Intravenous Continuous OumaLang Snow 3 mL/hr at 08/26/21 1300 3 mg/hr at 08/26/21 1300   midazolam (VERSED) injection 1 mg  1 mg Intravenous Q15 min PRN KeenDarel HongNP   1 mg at 08/25/21 2000   midazolam (VERSED) injection 1 mg  1 mg Intravenous Q2H PRN KeenDarel HongNP       norepinephrine (LEVOPHED) 16 mg in 250mL100mmix infusion  0-40 mcg/min Intravenous Titrated ScherBennie Pierini25.3 mL/hr at 08/26/21 1300 27 mcg/min at 08/26/21 1300   ondansetron (ZOFRAN) tablet 4 mg  4 mg Oral Q6H PRN Agbata, Tochukwu, MD       Or   ondansetron (ZOFRAN) injection 4 mg  4 mg Intravenous Q6H PRN Agbata, Tochukwu, MD       pantoprazole (PROTONIX) injection 40 mg  40 mg Intravenous Daily KeeneDarel HongP   40 mg at 08/26/21 0932   polyethylene glycol (MIRALAX / GLYCOLAX) packet 17 g  17 g Per Tube Daily KeeneDarel HongP   17 g at 08/26/21 1139   propofol (DIPRIVAN) 1000 MG/100ML infusion  5-80 mcg/kg/min Intravenous Titrated KeeneBradly Bienenstock   Held at 08/25/21 2108   vasopressin (PITRESSIN) 20 Units in sodium chloride 0.9 % 100 mL infusion-*FOR SHOCK*  0-0.03 Units/min Intravenous Continuous Schertz,  Michele Mcalpine, MD 9 mL/hr at 08/26/21 1300 0.03 Units/min at 08/26/21 1300      Allergies: Allergies  Allergen Reactions   Tetanus Toxoid, Adsorbed Swelling   Tetanus Toxoid     Other reaction(s): Unknown   Tetanus Toxoids Swelling   Lisinopril Rash    Other reaction(s): Unknown   Niacin Rash    Other reaction(s): Unknown   Sitagliptin Rash    Other reaction(s): Unknown   Sulfamethoxazole-Trimethoprim Rash    Other reaction(s): Unknown      Past Medical History: Past Medical History:  Diagnosis Date   Bacteremia    CAD (coronary artery disease)    Diabetes mellitus without complication (HCC)    Diastolic CHF ([place])    Hyperlipemia    Hypertension    Lymphedema    MI (myocardial infarction) (HCC)    OSA (obstructive sleep apnea)    Severe aortic stenosis      Past Surgical History: Past Surgical History:  Procedure Laterality Date   CARDIAC SURGERY     none     PACEMAKER INSERTION       Family History: Family History  Problem Relation Age of Onset   Cancer Mother    Heart failure Father      Social History: Social History   Socioeconomic History   Marital status: Single    Spouse name: Not on file   Number of children: Not on file   Years of education: Not on file   Highest education level: Not on file  Occupational History   Not on file  Tobacco Use   Smoking status: Never   Smokeless tobacco: Never  Vaping Use   Vaping Use: Never used  Substance and Sexual Activity   Alcohol use: No   Drug use: No   Sexual activity: Not on file  Other Topics Concern   Not on file  Social History Narrative   Not on file   Social Determinants of Health   Financial Resource Strain: Not on file  Food Insecurity: Not on file  Transportation Needs: Not on file  Physical Activity: Not on file  Stress:  Not on file  Social Connections: Not on file  Intimate Partner Violence: Not on file     Review of Systems: Review of Systems  Unable to perform ROS: Intubated   Vital Signs: Blood pressure (!) 107/59, pulse (!) 59, temperature 98.2 F (36.8 C), temperature source Oral, resp. rate (!) 23, height 5' 5"  (1.651 m), weight (!) 155.4 kg, SpO2 95 %.  Weight trends: Filed Weights   08/25/21 1300 08/25/21 1648 08/25/21 1835  Weight: (!) 154.7 kg (!) 154.7 kg (!) 155.4 kg    Physical Exam: General: Critically ill, morbidly obese  Head: ETT  Eyes: Anicteric, PERRL  Neck: obese  Lungs:  PRVC 40%  Heart: paced  Abdomen:  Soft, nontender, obese  Extremities:  ++ peripheral edema.  Neurologic: Intubated, sedated  Skin: No lesions  Access: none     Lab results: Basic Metabolic Panel: Recent Labs  Lab 08/25/21 1128 08/25/21 1939 08/26/21 0330  NA 140 143 140  K 3.4* 3.5 5.1  CL 108 109 109  CO2 22 19* 21*  GLUCOSE 182* 183* 264*  BUN 46* 50* 57*  CREATININE 2.31* 3.12* 3.99*  CALCIUM 8.5* 8.6* 8.1*    Liver Function Tests: Recent Labs  Lab 08/25/21 1128  AST 72*  ALT 36  ALKPHOS 45  BILITOT 0.9  PROT 5.7*  ALBUMIN 2.9*   No  results for input(s): LIPASE, AMYLASE in the last 168 hours. No results for input(s): AMMONIA in the last 168 hours.  CBC: Recent Labs  Lab 08/25/21 1128 08/25/21 2111 08/26/21 0330  WBC 8.9 21.3* 27.7*  HGB 11.8* 12.3 12.5  HCT 37.5 39.3 41.2  MCV 92.4 94.5 96.5  PLT 120* 128* 140*    Cardiac Enzymes: No results for input(s): CKTOTAL, CKMB, CKMBINDEX, TROPONINI in the last 168 hours.  BNP: Invalid input(s): POCBNP  CBG: Recent Labs  Lab 08/25/21 1939 08/25/21 2322 08/26/21 0356 08/26/21 0745 08/26/21 1132  GLUCAP 191* 201* 249* 237* 227*    Microbiology: Results for orders placed or performed during the hospital encounter of 08/25/21  Blood Culture (routine x 2)     Status: None (Preliminary result)   Collection  Time: 08/25/21 12:29 PM   Specimen: BLOOD RIGHT HAND  Result Value Ref Range Status   Specimen Description BLOOD RIGHT HAND  Final   Special Requests   Final    BOTTLES DRAWN AEROBIC AND ANAEROBIC Blood Culture results may not be optimal due to an inadequate volume of blood received in culture bottles   Culture  Setup Time   Final    Organism ID to follow GRAM NEGATIVE RODS IN BOTH AEROBIC AND ANAEROBIC BOTTLES CRITICAL RESULT CALLED TO, READ BACK BY AND VERIFIED WITH: MORGAN HICKS AT 2207 ON 08/25/21 BY SS Performed at [facility], [address] [place], [place] [address]    Culture GRAM NEGATIVE RODS  Final   Report Status PENDING  Incomplete  Blood Culture ID Panel (Reflexed)     Status: Abnormal   Collection Time: 08/25/21 12:29 PM  Result Value Ref Range Status   Enterococcus faecalis NOT DETECTED NOT DETECTED Final   Enterococcus Faecium NOT DETECTED NOT DETECTED Final   Listeria monocytogenes NOT DETECTED NOT DETECTED Final   Staphylococcus species NOT DETECTED NOT DETECTED Final   Staphylococcus aureus (BCID) NOT DETECTED NOT DETECTED Final   Staphylococcus epidermidis NOT DETECTED NOT DETECTED Final   Staphylococcus lugdunensis NOT DETECTED NOT DETECTED Final   Streptococcus species NOT DETECTED NOT DETECTED Final   Streptococcus agalactiae NOT DETECTED NOT DETECTED Final   Streptococcus pneumoniae NOT DETECTED NOT DETECTED Final   Streptococcus pyogenes NOT DETECTED NOT DETECTED Final   A.calcoaceticus-baumannii NOT DETECTED NOT DETECTED Final   Bacteroides fragilis NOT DETECTED NOT DETECTED Final   Enterobacterales DETECTED (A) NOT DETECTED Final    Comment: Enterobacterales represent a large order of gram negative bacteria, not a single organism. CRITICAL RESULT CALLED TO, READ BACK BY AND VERIFIED WITH: MORGAN HICKS AT 2207 ON 08/25/21 BY SS    Enterobacter cloacae complex NOT DETECTED NOT DETECTED Final   Escherichia coli DETECTED (A) NOT DETECTED Final     Comment: CRITICAL RESULT CALLED TO, READ BACK BY AND VERIFIED WITH: MORGAN HICKS AT 2207 ON 08/25/21 BY SS    Klebsiella aerogenes NOT DETECTED NOT DETECTED Final   Klebsiella oxytoca NOT DETECTED NOT DETECTED Final   Klebsiella pneumoniae NOT DETECTED NOT DETECTED Final   Proteus species NOT DETECTED NOT DETECTED Final   Salmonella species NOT DETECTED NOT DETECTED Final   Serratia marcescens NOT DETECTED NOT DETECTED Final   Haemophilus influenzae NOT DETECTED NOT DETECTED Final   Neisseria meningitidis NOT DETECTED NOT DETECTED Final   Pseudomonas aeruginosa NOT DETECTED NOT DETECTED Final   Stenotrophomonas maltophilia NOT DETECTED NOT DETECTED Final   Candida albicans NOT DETECTED NOT DETECTED Final   Candida auris NOT DETECTED  NOT DETECTED Final   Candida glabrata NOT DETECTED NOT DETECTED Final   Candida krusei NOT DETECTED NOT DETECTED Final   Candida parapsilosis NOT DETECTED NOT DETECTED Final   Candida tropicalis NOT DETECTED NOT DETECTED Final   Cryptococcus neoformans/gattii NOT DETECTED NOT DETECTED Final   CTX-M ESBL NOT DETECTED NOT DETECTED Final   Carbapenem resistance IMP NOT DETECTED NOT DETECTED Final   Carbapenem resistance KPC NOT DETECTED NOT DETECTED Final   Carbapenem resistance NDM NOT DETECTED NOT DETECTED Final   Carbapenem resist OXA 48 LIKE NOT DETECTED NOT DETECTED Final   Carbapenem resistance VIM NOT DETECTED NOT DETECTED Final    Comment: Performed at [facility], [address] [place], [place] [address]  Resp Panel by RT-PCR (Flu A&B, Covid) Nasopharyngeal Swab     Status: None   Collection Time: 08/25/21  3:05 PM   Specimen: Nasopharyngeal Swab; Nasopharyngeal(NP) swabs in vial transport medium  Result Value Ref Range Status   SARS Coronavirus 2 by RT PCR NEGATIVE NEGATIVE Final    Comment: (NOTE) SARS-CoV-2 target nucleic acids are NOT DETECTED.  The SARS-CoV-2 RNA is generally detectable in upper respiratory specimens during the  acute phase of infection. The lowest concentration of SARS-CoV-2 viral copies this assay can detect is 138 copies/mL. A negative result does not preclude SARS-Cov-2 infection and should not be used as the sole basis for treatment or other patient management decisions. A negative result may occur with  improper specimen collection/handling, submission of specimen other than nasopharyngeal swab, presence of viral mutation(s) within the areas targeted by this assay, and inadequate number of viral copies(<138 copies/mL). A negative result must be combined with clinical observations, patient history, and epidemiological information. The expected result is Negative.  Fact Sheet for Patients:  EntrepreneurPulse.com.au  Fact Sheet for Healthcare Providers:  IncredibleEmployment.be  This test is no t yet approved or cleared by the [place] FDA and  has been authorized for detection and/or diagnosis of SARS-CoV-2 by FDA under an Emergency Use Authorization (EUA). This EUA will remain  in effect (meaning this test can be used) for the duration of the COVID-19 declaration under Section 564(b)(1) of the Act, 21 U.S.C.section 360bbb-3(b)(1), unless the authorization is terminated  or revoked sooner.       Influenza A by PCR NEGATIVE NEGATIVE Final   Influenza B by PCR NEGATIVE NEGATIVE Final    Comment: (NOTE) The Xpert Xpress SARS-CoV-2/FLU/RSV plus assay is intended as an aid in the diagnosis of influenza from Nasopharyngeal swab specimens and should not be used as a sole basis for treatment. Nasal washings and aspirates are unacceptable for Xpert Xpress SARS-CoV-2/FLU/RSV testing.  Fact Sheet for Patients: EntrepreneurPulse.com.au  Fact Sheet for Healthcare Providers: IncredibleEmployment.be  This test is not yet approved or cleared by the [place] FDA and has been authorized for detection and/or  diagnosis of SARS-CoV-2 by FDA under an Emergency Use Authorization (EUA). This EUA will remain in effect (meaning this test can be used) for the duration of the COVID-19 declaration under Section 564(b)(1) of the Act, 21 U.S.C. section 360bbb-3(b)(1), unless the authorization is terminated or revoked.  Performed at [facility], [address] [place], [place] [address]   MRSA Next Gen by PCR, Nasal     Status: None   Collection Time: 08/25/21  6:44 PM   Specimen: Nasal Mucosa; Nasal Swab  Result Value Ref Range Status   MRSA by PCR Next Gen NOT DETECTED NOT DETECTED Final  Comment: (NOTE) The GeneXpert MRSA Assay (FDA approved for NASAL specimens only), is one component of a comprehensive MRSA colonization surveillance program. It is not intended to diagnose MRSA infection nor to guide or monitor treatment for MRSA infections. Test performance is not FDA approved in patients less than 51 years old. Performed at [facility], [address] [place], [place] [address]   Blood Culture (routine x 2)     Status: None (Preliminary result)   Collection Time: 08/25/21  7:38 PM   Specimen: BLOOD  Result Value Ref Range Status   Specimen Description BLOOD RIGHT HAND  Final   Special Requests   Final    BOTTLES DRAWN AEROBIC AND ANAEROBIC Blood Culture adequate volume   Culture   Final    NO GROWTH < 12 HOURS Performed at [facility], [address], [place], [place] [address]    Report Status PENDING  Incomplete    Coagulation Studies: Recent Labs    08/25/21 1128 08/26/21 0330  LABPROT 18.5* 22.0*  INR 1.6* 1.9*    Urinalysis: Recent Labs    08/25/21 1347  COLORURINE AMBER*  LABSPEC 1.014  PHURINE 5.0  GLUCOSEU NEGATIVE  HGBUR LARGE*  BILIRUBINUR NEGATIVE  KETONESUR NEGATIVE  PROTEINUR 100*  NITRITE NEGATIVE  LEUKOCYTESUR LARGE*      Imaging: CT ABDOMEN PELVIS WO CONTRAST  Result Date: 08/25/2021 CLINICAL DATA:  Sepsis  presentation.  Foul-smelling urine. EXAM: CT ABDOMEN AND PELVIS WITHOUT CONTRAST TECHNIQUE: Multidetector CT imaging of the abdomen and pelvis was performed following the standard protocol without IV contrast. RADIATION DOSE REDUCTION: This exam was performed according to the departmental dose-optimization program which includes automated exposure control, adjustment of the mA and/or kV according to patient size and/or use of iterative reconstruction technique. COMPARISON:  CT 01/31/2020 FINDINGS: Lower chest: See results of chest CT.  Edema/ARDS versus pneumonia. Hepatobiliary: Liver parenchyma is normal. Calcified gallstone as seen previously. No CT evidence of cholecystitis or obstruction. Pancreas: Fatty replacement of the pancreas. No acute pancreatic pathology. 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 Maximal dimension 13 mm. 3 mm nonobstructing stone in the lower pole the right kidney. Two adjacent stones within the proximal right ureter, each approximately 6 mm in size, with mild right hydronephrosis. Several small nonobstructing stones in the left kidney. 1 cm stone in the left renal pelvis without obstruction presently. This would have potential for bowl bowel obstruction. Bladder appears unremarkable. Stomach/Bowel: Stomach and small intestine are normal. No colon pathology seen. Vascular/Lymphatic: Aortic atherosclerosis. No aneurysm. IVC is normal. No adenopathy. Reproductive: No pelvic mass. Other: No free fluid or air. Musculoskeletal: Chronic lumbar degenerative changes. IMPRESSION: Hydronephrosis on the right due to 2 adjacent 6 mm stones in the proximal right ureter, at about the level of the lower pole of the kidney. Possibility of infection associated with the obstruction does exist. 1 cm stone in the left renal pelvis without evidence of obstruction presently. Chololithiasis without CT evidence of cholecystitis or obstruction.  Insignificant 13 mm adrenal adenoma, unchanged since previous exam. Electronically Signed   By: Nelson Chimes M.D.   On: 08/25/2021 14:32   DG Abd 1 View  Result Date: 08/26/2021 CLINICAL DATA:  NG tube placement. EXAM: ABDOMEN - 1 VIEW COMPARISON:  CT of the abdomen and pelvis 08/25/2021 FINDINGS: Side port of the NG tube is in the stomach. Bowel gas pattern is unremarkable. Bilateral ureteral stents are now in place. IMPRESSION: NG tube in  the stomach. Electronically Signed   By: San Morelle M.D.   On: 08/26/2021 10:13   CT Chest Wo Contrast  Result Date: 08/25/2021 CLINICAL DATA:  Sepsis. Fell smelling urine. Chronic lower extremity wounds. Altered mental status. EXAM: CT CHEST WITHOUT CONTRAST TECHNIQUE: Multidetector CT imaging of the chest was performed following the standard protocol without IV contrast. RADIATION DOSE REDUCTION: This exam was performed according to the departmental dose-optimization program which includes automated exposure control, adjustment of the mA and/or kV according to patient size and/or use of iterative reconstruction technique. COMPARISON:  Chest radiography same day. Chest CT 07/11/2017. CT abdomen 01/31/2020. FINDINGS: Cardiovascular: The heart is enlarged. Previous aortic stent placement. Pacemaker in place. Mediastinum/Nodes: No mass or lymphadenopathy. Lungs/Pleura: Bilateral perihilar airspace filling pattern most consistent with edema/ARDS. Pneumonia is possible. Findings worse on the left than the right. No lobar consolidation or collapse. No effusion. Upper Abdomen: See results of abdominal CT Musculoskeletal: Negative IMPRESSION: Bilateral perihilar airspace filling pattern left more than right most consistent with acute edema/ARDS. Pneumonia not excluded, but there is no dense consolidation, lobar distribution or collapse. No effusion. Aortic Atherosclerosis (ICD10-I70.0). Electronically Signed   By: Nelson Chimes M.D.   On: 08/25/2021 14:27   DG Chest  Port 1 View  Result Date: 08/25/2021 CLINICAL DATA:  Central venous catheter placement EXAM: PORTABLE CHEST 1 VIEW COMPARISON:  08/25/2021 FINDINGS: Endotracheal tube seen 4 cm above the carina. Right internal jugular central venous catheter tip noted within the right atrium. Lung volumes are small, but are symmetric and are stable since prior examination. Superimposed perihilar pulmonary infiltrate persists in keeping with probable mild perihilar pulmonary edema. No pneumothorax or pleural effusion. Transcatheter aortic valve replacement has been performed. Mild cardiomegaly is stable. Left subclavian dual lead pacemaker is unchanged. IMPRESSION: Right internal jugular central venous catheter tip within the right atrium. No pneumothorax. Stable pulmonary insufflation. Stable mild perihilar pulmonary edema, possibly cardiogenic in nature Electronically Signed   By: Fidela [place]isbury M.D.   On: 08/25/2021 21:39   DG Chest Port 1 View  Result Date: 08/25/2021 CLINICAL DATA:  Intubation.  Ventilator support. EXAM: PORTABLE CHEST 1 VIEW COMPARISON:  Earlier same day FINDINGS: Endotracheal tube tip 4 cm above the carina. Cardiomegaly. Previous aortic stent. Mild interstitial and alveolar edema. No pulmonary collapse. IMPRESSION: Endotracheal tube tip 4 cm above the carina. Mild edema again noted. Electronically Signed   By: Nelson Chimes M.D.   On: 08/25/2021 19:31   DG Chest Port 1 View  Result Date: 08/25/2021 CLINICAL DATA:  Sepsis. EXAM: PORTABLE CHEST 1 VIEW COMPARISON:  July 11, 2017. FINDINGS: Stable cardiomegaly. Status post transcatheter aortic valve repair. Interval placement of left-sided pacemaker with leads in grossly good position. Left lung is clear. Mild right basilar subsegmental atelectasis or infiltrate is noted. Bony thorax is unremarkable. IMPRESSION: Mild right basilar subsegmental atelectasis or infiltrate is noted. Electronically Signed   By: Marijo Conception M.D.   On: 08/25/2021 12:49    DG OR UROLOGY CYSTO IMAGE ([facility] ONLY)  Result Date: 08/25/2021 There is no interpretation for this exam.  This order is for images obtained during a surgical procedure.  Please See "Surgeries" Tab for more information regarding the procedure.     Assessment & Plan: Ms. Markeisha Mancias is a 71 y.o. white female with morbid obesity, obstructive sleep apnea, diabetes mellitus type II, congestive heart failure, hypertension, hyperlipidemia, lymphedema who was admitted to [facility] on 07/04/1476 for Renal colic [G95] Sepsis ([place]) [A41.9] Urinary  tract infection with hematuria, site unspecified [N39.0, R31.9] Community acquired pneumonia, unspecified laterality [J18.9] Sepsis, due to unspecified organism, unspecified whether acute organ dysfunction present ([place]) [A41.9]  Acute kidney injury on chronic kidney disease stage IIIB: baseline creatinine of 1.35, GFR of 42 on 05/17/21. Anuric urine output. Secondary to ATN from obstructive uropathy and ongoing systemic shock with hypotension and sepsis. Chronic kidney disease secondary to diabetes and hypertension.  - patient will need renal replacement therapy. Discussed case with patient's cousin, [name]  - CVVHDF orders placed.  - ICU to place hemodialysis catheter.   Sepsis with urinary tract infection, hypotension and systemic shock: E. Coli in blood cultures. Requiring stress dose steroids, vasopressors: norepinephrine and vasopressors.  - status post bilateral ureteral stents  - continue ceftriaxone.   Overall prognosis remains critical. Thank you for allowing me to participate patient's care.      LOS: 1 Kenyatta Keidel 3/3/20231:33 PM

## 2021-08-26 NOTE — Progress Notes (Signed)
*  PRELIMINARY RESULTS* ?Echocardiogram ?2D Echocardiogram has been performed. ? ?[name] ?08/26/2021, 1:48 PM ?

## 2021-08-27 DIAGNOSIS — A419 Sepsis, unspecified organism: Secondary | ICD-10-CM | POA: Diagnosis not present

## 2021-08-27 DIAGNOSIS — R7881 Bacteremia: Secondary | ICD-10-CM

## 2021-08-27 DIAGNOSIS — B9620 Unspecified Escherichia coli [E. coli] as the cause of diseases classified elsewhere: Secondary | ICD-10-CM

## 2021-08-27 DIAGNOSIS — A4151 Sepsis due to Escherichia coli [E. coli]: Secondary | ICD-10-CM | POA: Diagnosis not present

## 2021-08-27 DIAGNOSIS — N139 Obstructive and reflux uropathy, unspecified: Secondary | ICD-10-CM | POA: Diagnosis not present

## 2021-08-27 DIAGNOSIS — N179 Acute kidney failure, unspecified: Secondary | ICD-10-CM | POA: Diagnosis not present

## 2021-08-27 DIAGNOSIS — N12 Tubulo-interstitial nephritis, not specified as acute or chronic: Secondary | ICD-10-CM

## 2021-08-27 LAB — CBC
HCT: 36.7 % (ref 36.0–46.0)
Hemoglobin: 11.5 g/dL — ABNORMAL LOW (ref 12.0–15.0)
MCH: 29.4 pg (ref 26.0–34.0)
MCHC: 31.3 g/dL (ref 30.0–36.0)
MCV: 93.9 fL (ref 80.0–100.0)
Platelets: 101 10*3/uL — ABNORMAL LOW (ref 150–400)
RBC: 3.91 MIL/uL (ref 3.87–5.11)
RDW: 15.3 % (ref 11.5–15.5)
WBC: 20.1 10*3/uL — ABNORMAL HIGH (ref 4.0–10.5)
nRBC: 0 % (ref 0.0–0.2)

## 2021-08-27 LAB — RENAL FUNCTION PANEL
Albumin: 2.7 g/dL — ABNORMAL LOW (ref 3.5–5.0)
Albumin: 2.5 g/dL — ABNORMAL LOW (ref 3.5–5.0)
Anion gap: 9 (ref 5–15)
Anion gap: 8 (ref 5–15)
BUN: 41 mg/dL — ABNORMAL HIGH (ref 8–23)
BUN: 28 mg/dL — ABNORMAL HIGH (ref 8–23)
CO2: 24 mmol/L (ref 22–32)
CO2: 24 mmol/L (ref 22–32)
Calcium: 8.3 mg/dL — ABNORMAL LOW (ref 8.9–10.3)
Calcium: 8.6 mg/dL — ABNORMAL LOW (ref 8.9–10.3)
Chloride: 104 mmol/L (ref 98–111)
Chloride: 103 mmol/L (ref 98–111)
Creatinine, Ser: 2.66 mg/dL — ABNORMAL HIGH (ref 0.44–1.00)
Creatinine, Ser: 1.69 mg/dL — ABNORMAL HIGH (ref 0.44–1.00)
GFR, Estimated: 19 mL/min — ABNORMAL LOW (ref >60)
GFR, Estimated: 32 mL/min — ABNORMAL LOW (ref >60)
Glucose, Bld: 165 mg/dL — ABNORMAL HIGH (ref 70–99)
Glucose, Bld: 170 mg/dL — ABNORMAL HIGH (ref 70–99)
Phosphorus: 3.6 mg/dL (ref 2.5–4.6)
Phosphorus: 2.6 mg/dL (ref 2.5–4.6)
Potassium: 3.9 mmol/L (ref 3.5–5.1)
Potassium: 3.6 mmol/L (ref 3.5–5.1)
Sodium: 137 mmol/L (ref 135–145)
Sodium: 135 mmol/L (ref 135–145)

## 2021-08-27 LAB — GLUCOSE, CAPILLARY
Glucose-Capillary: 158 mg/dL — ABNORMAL HIGH (ref 70–99)
Glucose-Capillary: 165 mg/dL — ABNORMAL HIGH (ref 70–99)
Glucose-Capillary: 166 mg/dL — ABNORMAL HIGH (ref 70–99)
Glucose-Capillary: 172 mg/dL — ABNORMAL HIGH (ref 70–99)
Glucose-Capillary: 202 mg/dL — ABNORMAL HIGH (ref 70–99)
Glucose-Capillary: 95 mg/dL (ref 70–99)

## 2021-08-27 LAB — LACTIC ACID, PLASMA
Lactic Acid, Venous: 2.8 mmol/L (ref 0.5–1.9)
Lactic Acid, Venous: 2.2 mmol/L (ref 0.5–1.9)

## 2021-08-27 LAB — HEMOGLOBIN A1C
Hgb A1c MFr Bld: 7.6 % — ABNORMAL HIGH (ref 4.8–5.6)
Mean Plasma Glucose: 171 mg/dL

## 2021-08-27 LAB — ECHOCARDIOGRAM COMPLETE
AR max vel: 1.69 cm2
AV Area VTI: 1.45 cm2
AV Area mean vel: 1.47 cm2
AV Mean grad: 5 mmHg
AV Peak grad: 8.3 mmHg
Ao pk vel: 1.44 m/s
Area-P 1/2: 1.94 cm2
Height: 65 in
MV VTI: 0.97 cm2
S' Lateral: 3.52 cm
Weight: 5481.52 oz

## 2021-08-27 LAB — PROTIME-INR
INR: 1.8 — ABNORMAL HIGH (ref 0.8–1.2)
Prothrombin Time: 20.4 seconds — ABNORMAL HIGH (ref 11.4–15.2)

## 2021-08-27 LAB — BLOOD GAS, VENOUS
Acid-base deficit: 2.8 mmol/L — ABNORMAL HIGH (ref 0.0–2.0)
Bicarbonate: 24.1 mmol/L (ref 20.0–28.0)
FIO2: 35 %
MECHVT: 460 mL
O2 Saturation: 67.2 %
PEEP: 5 cmH2O
Patient temperature: 37
RATE: 22 resp/min
pCO2, Ven: 49 mmHg (ref 44–60)
pH, Ven: 7.3 (ref 7.25–7.43)
pO2, Ven: 40 mmHg (ref 32–45)

## 2021-08-27 LAB — MAGNESIUM: Magnesium: 1.7 mg/dL (ref 1.7–2.4)

## 2021-08-27 LAB — HEPARIN LEVEL (UNFRACTIONATED): Heparin Unfractionated: >1.1 IU/mL — ABNORMAL HIGH (ref 0.30–0.70)

## 2021-08-27 LAB — APTT
aPTT: 117 seconds — ABNORMAL HIGH (ref 24–36)
aPTT: 73 seconds — ABNORMAL HIGH (ref 24–36)
aPTT: 64 seconds — ABNORMAL HIGH (ref 24–36)

## 2021-08-27 LAB — PROCALCITONIN: Procalcitonin: 79.09 ng/mL

## 2021-08-27 MED ORDER — INSULIN GLARGINE-YFGN 100 UNIT/ML ~~LOC~~ SOLN
15.0000 [IU] | Freq: Every day | SUBCUTANEOUS | Status: DC
  Administered 2021-08-27 – 2021-08-29 (×3): 15 [IU] via SUBCUTANEOUS
  Filled 2021-08-27 (×3): qty 0.15

## 2021-08-27 MED ORDER — FREE WATER
20.0000 mL | Status: DC
  Administered 2021-08-27 – 2021-08-29 (×9): 20 mL

## 2021-08-27 MED ORDER — HEPARIN BOLUS VIA INFUSION
1500.0000 [IU] | Freq: Once | INTRAVENOUS | Status: AC
  Administered 2021-08-27: 1500 [IU] via INTRAVENOUS
  Filled 2021-08-27: qty 1500

## 2021-08-27 MED ORDER — PROSOURCE TF PO LIQD
45.0000 mL | Freq: Two times a day (BID) | ORAL | Status: DC
  Administered 2021-08-27 – 2021-08-29 (×4): 45 mL
  Filled 2021-08-27: qty 45

## 2021-08-27 MED ORDER — VITAL HIGH PROTEIN PO LIQD
1000.0000 mL | ORAL | Status: DC
  Administered 2021-08-27 – 2021-08-28 (×2): 1000 mL

## 2021-08-27 MED ORDER — SODIUM CHLORIDE 0.9 % IV SOLN
INTRAVENOUS | Status: DC | PRN
Start: 2021-08-27 — End: 2021-09-06

## 2021-08-27 MED ORDER — INSULIN GLARGINE-YFGN 100 UNIT/ML ~~LOC~~ SOLN: 10.0000 [IU] | Freq: Every day | SUBCUTANEOUS | Status: DC

## 2021-08-27 NOTE — Progress Notes (Signed)
Per Dr. Theador Hawthorne okay to use 2K 3.5 Ca bath for CRRT in place of orders. 2K 2.5 Ca bath unavailable.  ?

## 2021-08-27 NOTE — Plan of Care (Signed)
Patient remains intubated, sedated, on pressors, on CRRT. ? ? ?Problem: Education: ?Goal: Knowledge of General Education information will improve ?Description: Including pain rating scale, medication(s)/side effects and non-pharmacologic comfort measures ?Outcome: Progressing ?  ?Problem: Health Behavior/Discharge Planning: ?Goal: Ability to manage health-related needs will improve ?Outcome: Progressing ?  ?Problem: Clinical Measurements: ?Goal: Ability to maintain clinical measurements within normal limits will improve ?Outcome: Progressing ?Goal: Will remain free from infection ?Outcome: Progressing ?Goal: Diagnostic test results will improve ?Outcome: Progressing ?Goal: Respiratory complications will improve ?Outcome: Progressing ?Goal: Cardiovascular complication will be avoided ?Outcome: Progressing ?  ?Problem: Activity: ?Goal: Risk for activity intolerance will decrease ?Outcome: Progressing ?  ?Problem: Nutrition: ?Goal: Adequate nutrition will be maintained ?Outcome: Progressing ?  ?Problem: Coping: ?Goal: Level of anxiety will decrease ?Outcome: Progressing ?  ?Problem: Elimination: ?Goal: Will not experience complications related to bowel motility ?Outcome: Progressing ?Goal: Will not experience complications related to urinary retention ?Outcome: Progressing ?  ?Problem: Pain Managment: ?Goal: General experience of comfort will improve ?Outcome: Progressing ?  ?Problem: Safety: ?Goal: Ability to remain free from injury will improve ?Outcome: Progressing ?  ?Problem: Skin Integrity: ?Goal: Risk for impaired skin integrity will decrease ?Outcome: Progressing ?  ?Problem: Safety: ?Goal: Non-violent Restraint(s) ?Outcome: Progressing ?  ?

## 2021-08-27 NOTE — Progress Notes (Signed)
Nutrition Follow-up ? ?Dietitian Consult received for TF initiation and management. MD ordered trickle TF only via adult TF protocol, Vital High Protein at 20 ml/hr. ? ?Full initial assessment completed by RD yesterday with TF recommendations as follows: ?Vital High Protein at 60 ml/hr ?Pro-Source TF 45 mL BID ?This goal regimen provides 1520 kcals, 148 g of protein and 1384 mL of free water ? ?Kerman Passey MS, RDN, LDN, CNSC ?Registered Dietitian III ?Clinical Nutrition ?RD Pager and On-Call Pager Number Located in [place]  ? ?

## 2021-08-27 NOTE — Progress Notes (Signed)
NAME:  [name], MRN:  [id number], DOB:  Aug 13, 1950, LOS: 2 ADMISSION DATE:  08/25/2021, CONSULTATION DATE:  08/25/2021 REFERRING MD:  Dr. Francine Graven, CHIEF COMPLAINT:  Altered Mental Status   Brief Pt Description / Synopsis:  71 y.o. Female admitted with Severe Urosepsis with Septic Shock in the setting of Right proximal ureteral stone with Hydronephrosis & large left renal stone, Acute Kidney Injury, and Acute Metabolic Encephalopathy.  She underwent urgent cystoscopy with bilateral ureteral stent placement.  She returns to ICU post procedure and remains intubated.  History of Present Illness:  [name] is a 71 y.o. female with a past medical history significant for HFpEF, severe aortic stenosis status post TAVR, morbid obesity, obesity hypoventilation syndrome, OSA, diabetes mellitus, coronary artery disease, hypertension who presented to [facility] ED on 08/25/2021 due to complaints of altered mental status.  Patient is currently intubated and sedated following return from the OR and no family is available, therefore history is obtained from chart review.  ED Course: Initial vital signs: Temperature 99.2, respiratory rate 27, pulse , blood pressure  Significant Labs: Potassium 3.4, bicarbonate 22, glucose 182, BUN 46, creatinine 2.31, calcium 8.5, anion gap 10, albumin 2.9, AST 72, ALT 36, total bilirubin 5.7, alkaline phosphatase 45, WBC 8.9, hemoglobin 11.8, hematocrit 37.5, platelets 120, PT 18.5, INR 1.6, lactic acid 4.3 COVID-19 PCR is negative Urinalysis consistent with UTI Imaging: Chest x-ray>>IMPRESSION: Mild right basilar subsegmental atelectasis or infiltrate is noted. CT chest>>IMPRESSION: Bilateral perihilar airspace filling pattern left more than right most consistent with acute edema/ARDS. Pneumonia not excluded, but there is no dense consolidation, lobar distribution or collapse. No effusion. Aortic Atherosclerosis CT abdomen pelvis>>IMPRESSION: Hydronephrosis on the right  due to 2 adjacent 6 mm stones in the proximal right ureter, at about the level of the lower pole of the kidney. Possibility of infection associated with the obstruction does exist. 1 cm stone in the left renal pelvis without evidence of obstruction presently. Chololithiasis without CT evidence of cholecystitis or obstruction. Insignificant 13 mm adrenal adenoma, unchanged since previous exam. Medications administered: 2.5 L of LR boluses, IV cefepime, Flagyl, vancomycin   Hospitalist were asked to admit for further work-up and treatment of severe urosepsis due to right proximal ureter stone with hydronephrosis and large left renal stone.  Urology was consulted.  She was taken urgently to the OR for cystoscopy with bilateral ureteral stent placement.  Anesthesia does report she required vasopressors Intra-Op.  She returns to ICU post procedure and remains intubated.  PCCM consulted for management of mechanical ventilation and critical illness.  Pertinent  Medical History  HFpEF Severe aortic stenosis status post TAVR Coronary artery disease Hypertension Hyperlipidemia Obesity hypoventilation syndrome OSA Morbid obesity Diabetes mellitus  Micro Data:  08/25/2021: SARS-CoV-2 and influenza PCR>> negative 08/25/2021: Blood culture>>+E Coli 08/25/2021: Urine>>+E coli 08/25/2021: Tracheal aspirate>> 08/25/2021: Strep pneumo urinary antigen>>negative 08/25/2021: Legionella urine antigen>>  Antimicrobials:  Cefepime x1 dose 3/2 Flagyl x1 dose 3/2 Vancomycin x1 dose 3/2 Ceftriaxone 3/2>>  Significant Hospital Events: Including procedures, antibiotic start and stop dates in addition to other pertinent events   3/2: Presented to ED, admitted by hospitalist.  Urology consulted, taken emergently to the OR for cystoscopy and bilateral ureteral stent placement.  Returns to ICU postop and remains intubated, PCCM consulted 3/3: worsening shock, added vasopressin; started CRRT  Interim History /  Subjective:  Remains on Levophed and vasopressin with pressor requirement decreasing. Started CRRT yesterday; no complications. Blood and urine cultures positive for E coli. Leukocytosis  and fever curve downtrending.  Objective   Blood pressure 106/68, pulse 60, temperature (!) 96.3 F (35.7 C), temperature source Esophageal, resp. rate (!) 21, height _0  (1.651 m), weight (!) 156.9 kg, SpO2 100 %.    Vent Mode: PRVC FiO2 (%):  [28 %-50 %] 28 % Set Rate:  [22 bmp] 22 bmp Vt Set:  [460 mL] 460 mL PEEP:  [5 cmH20] 5 cmH20   Intake/Output Summary (Last 24 hours) at 08/27/2021 1432 Last data filed at 08/27/2021 1400 Gross per 24 hour  Intake 1814.17 ml  Output 1549 ml  Net 265.17 ml   Filed Weights   08/25/21 1648 08/25/21 1835 08/27/21 0428  Weight: (!) 154.7 kg (!) 155.4 kg (!) 156.9 kg    Examination: General: ill-appearing female, intubated and sedated Lungs: decreased at the bases bilaterally, otherwise clear Cardiovascular: Regular rate and rhythm, S1-S2, no murmurs, rubs, gallops Abdomen: Morbidly obese, soft, nontender, nondistended, no guarding rebound tenderness, distant bowel sounds Extremities: No deformities, chronic venous trophic changes to BLE  Resolved Hospital Problem list     Assessment & Plan:   Septic Shock 2/2 E coli pyelonephritis with bacteremia Bilateral ureteral stones with severe left hydronephrosis s/p b/l ureterals stent placement (08/25/21) -Wean pressors as able; cont Levo, vasopressin -MAP goal >65 -Stress dose steroids -Continue ceftriaxone 2 g daily for planned 7 day course -Awaiting culture sensitivities  Chronic HFpEF Demand ischemia Paroxsymal Atrial Fibrillation PMHx: Severe aortic stenosis s/p TAVR, CAD, MI, Hypertension, Hyperlipidemia -Continuous cardiac monitoring -Maintain MAP >65 as above -Vasopressors as needed to maintain MAP goal -EF down to 45-50% from 50-55% prior in setting of acute infection -Optimize sepsis and ensure  GDMT with Cardiology follow up -Heparin gtt for Afib  Acute Hypoxic Respiratory Failure in the setting of septic shock, multiple metabolic derangements PMHx: OSA -Full vent support, implement lung protective strategies -Plateau pressures less than 30 cm H20 -Wean FiO2 & PEEP as tolerated to maintain O2 sats >92% -Follow intermittent Chest X-ray & ABG as needed -Spontaneous Breathing Trials when respiratory parameters met and mental status permits -Implement VAP Bundle -Prn Bronchodilators  Acute Kidney Injury Anion Gap Metabolic Acidosis in the setting of Lactic Acidosis and AKI -Nephrology consulted -Continue CRRT; goal UF 0 -Avoid nephrotoxins -Renally dose medications -Strict I/O; monitor UOP  Mild Thrombocytopenia, likely in setting of septic shock -Monitor for S/Sx of bleeding -Trend CBC -Heparin as above -Transfuse for Hgb <7  Acute Metabolic Encephalopathy Sedation needs in the setting of mechanical ventilation -Maintain a RASS goal of 0 to -1 -Lighten sedation today -Daily wake up assessment -Treat sepsis and metabolic derangements as above  Diabetes Mellitus -CBG's q4h; Target range of 140 to 180 -SSI -Follow ICU Hypo/Hyperglycemia protocol   Best Practice (right click and "Reselect all SmartList Selections" daily)   Diet/type: NPO; start trickle feeds DVT prophylaxis: systemic heparin GI prophylaxis: PPI Lines: Central line, Dialysis Catheter, and yes and it is still needed Foley:  Yes, and it is still needed Code Status:  full code Last date of multidisciplinary goals of care discussion [08/26/21]  Labs   CBC: Recent Labs  Lab 08/25/21 1128 08/25/21 2111 08/26/21 0330 08/27/21 0415  WBC 8.9 21.3* 27.7* 20.1*  HGB 11.8* 12.3 12.5 11.5*  HCT 37.5 39.3 41.2 36.7  MCV 92.4 94.5 96.5 93.9  PLT 120* 128* 140* 101*    Basic Metabolic Panel: Recent Labs  Lab 08/25/21 1939 08/26/21 0330 08/26/21 1310 08/26/21 1600 08/27/21 0415  NA 143 140 140  138 137  K 3.5 5.1 6.0* 4.6 3.9  CL 109 109 105 105 104  CO2 19* 21* 20* 21* 24  GLUCOSE 183* 264* 252* 200* 165*  BUN 50* 57* 61* 57* 41*  CREATININE 3.12* 3.99* 4.47* 3.80* 2.66*  CALCIUM 8.6* 8.1* 8.0* 7.9* 8.3*  MG  --   --   --   --  1.7  PHOS  --   --   --  4.6 3.6   GFR: Estimated Creatinine Clearance: 30.1 mL/min (A) (by C-G formula based on SCr of 2.66 mg/dL (H)). Recent Labs  Lab 08/25/21 1128 08/25/21 1229 08/25/21 1939 08/25/21 2111 08/26/21 0005 08/26/21 0330 08/26/21 1310 08/27/21 0415 08/27/21 0800  PROCALCITON  --   --  124.35  --   --  >150.00  --  79.09  --   WBC 8.9  --   --  21.3*  --  27.7*  --  20.1*  --   LATICACIDVEN  --    < >  --  2.8* 3.2* 3.2* 3.9*  --  2.8*   < > = values in this interval not displayed.    Liver Function Tests: Recent Labs  Lab 08/25/21 1128 08/26/21 1600 08/27/21 0415  AST 72*  --   --   ALT 36  --   --   ALKPHOS 45  --   --   BILITOT 0.9  --   --   PROT 5.7*  --   --   ALBUMIN 2.9* 2.9* 2.7*   No results for input(s): LIPASE, AMYLASE in the last 168 hours. No results for input(s): AMMONIA in the last 168 hours.  ABG    Component Value Date/Time   PHART 7.26 (L) 08/26/2021 1525   PCO2ART 37 08/26/2021 1525   PO2ART 97 08/26/2021 1525   HCO3 24.1 08/27/2021 0415   ACIDBASEDEF 2.8 (H) 08/27/2021 0415   O2SAT 67.2 08/27/2021 0415     Coagulation Profile: Recent Labs  Lab 08/25/21 1128 08/26/21 0330 08/27/21 0415  INR 1.6* 1.9* 1.8*    Cardiac Enzymes: No results for input(s): CKTOTAL, CKMB, CKMBINDEX, TROPONINI in the last 168 hours.  HbA1C: Hgb A1c MFr Bld  Date/Time Value Ref Range Status  08/25/2021 09:11 PM 7.6 (H) 4.8 - 5.6 % Final    Comment:    (NOTE)         Prediabetes: 5.7 - 6.4         Diabetes: >6.4         Glycemic control for adults with diabetes: <7.0   07/12/2017 01:09 AM 6.4 (H) 4.8 - 5.6 % Final    Comment:    (NOTE) Pre diabetes:          5.7%-6.4% Diabetes:               >6.4% Glycemic control for   <7.0% adults with diabetes     CBG: Recent Labs  Lab 08/26/21 1949 08/26/21 2348 08/27/21 0350 08/27/21 0736 08/27/21 1124  GLUCAP 153* 186* 166* 158* 165*    Review of Systems:   Unable to assess due to intubation and sedation   Past Medical History:  She,  has a past medical history of Bacteremia, CAD (coronary artery disease), Diabetes mellitus without complication ([place]), Diastolic CHF ([place]), Hyperlipemia, Hypertension, Lymphedema, MI (myocardial infarction) ([place]), OSA (obstructive sleep apnea), and Severe aortic stenosis.   Surgical History:   Past Surgical History:  Procedure Laterality Date   CARDIAC SURGERY     CYSTOSCOPY  W/ URETERAL STENT PLACEMENT  08/25/2021   Procedure: CYSTOSCOPY WITH RETROGRADE PYELOGRAM/URETERAL STENT PLACEMENT;  Surgeon: Billey Co, MD;  Location: [facility] ORS;  Service: Urology;;   none     PACEMAKER INSERTION       Social History:   reports that she has never smoked. She has never used smokeless tobacco. She reports that she does not drink alcohol and does not use drugs.   Family History:  Her family history includes Cancer in her mother; Heart failure in her father.   Allergies Allergies  Allergen Reactions   Tetanus Toxoid, Adsorbed Swelling   Tetanus Toxoid     Other reaction(s): Unknown   Tetanus Toxoids Swelling   Lisinopril Rash    Other reaction(s): Unknown   Niacin Rash    Other reaction(s): Unknown   Sitagliptin Rash    Other reaction(s): Unknown   Sulfamethoxazole-Trimethoprim Rash    Other reaction(s): Unknown     Home Medications  Prior to Admission medications   Medication Sig Start Date End Date Taking? Authorizing Provider  acetaminophen (TYLENOL) 325 MG tablet Take 2 tablets (650 mg total) by mouth every 6 (six) hours as needed for mild pain (or Fever >/= 101). 11/05/17  Yes Wieting, Richard, MD  amLODipine (NORVASC) 5 MG tablet Take 5 mg by mouth daily. 08/09/21  Yes [provider]  apixaban (ELIQUIS) 5 MG TABS tablet Take 1 tablet by mouth 2 (two) times daily. 08/10/17  Yes [provider]  atorvastatin (LIPITOR) 40 MG tablet Take 1 tablet (40 mg total) by mouth daily at 6 PM. 05/09/16  Yes Epifanio Lesches, MD  cefpodoxime (VANTIN) 100 MG tablet Take 1 tablet (100 mg total) by mouth 2 (two) times daily. 11/29/20  Yes Billey Co, MD  Cholecalciferol 50 MCG (2000 UT) CAPS Take 1 capsule by mouth daily.   Yes [provider]  docusate sodium (COLACE) 100 MG capsule Take 1 capsule (100 mg total) by mouth daily. 11/05/17  Yes Wieting, Richard, MD  fenofibrate (TRICOR) 145 MG tablet Take 1 tablet by mouth daily. 04/17/16  Yes [provider]  furosemide (LASIX) 40 MG tablet Take 1 tablet (40 mg total) by mouth daily. 07/17/16  Yes Vaughan Basta, MD  insulin aspart (NOVOLOG FLEXPEN) 100 UNIT/ML FlexPen Inject 30 Units into the skin daily. 10/28/19  Yes [provider]  Insulin Glargine (BASAGLAR KWIKPEN) 100 UNIT/ML Inject 70 Units into the skin daily. 04/23/17  Yes [provider]  metFORMIN (GLUCOPHAGE) 1000 MG tablet Take 1 tablet (1,000 mg total) by mouth daily with breakfast. 05/22/17  Yes Max Sane, MD  metoprolol tartrate (LOPRESSOR) 25 MG tablet Take 1 tablet (25 mg total) by mouth 2 (two) times daily. 05/22/17  Yes Max Sane, MD  Potassium Chloride ER 20 MEQ TBCR Take 1 tablet by mouth daily. 02/09/20  Yes [provider]  amiodarone (PACERONE) 200 MG tablet Take 1 tablet (200 mg total) by mouth daily. Patient not taking: Reported on 08/25/2021 11/06/17   Loletha Grayer, MD  BD PEN NEEDLE NANO 2ND GEN 32G X 4 MM MISC 4 (four) times daily. use as directed 02/09/20   [provider]  tamsulosin (FLOMAX) 0.4 MG CAPS capsule Take 1 capsule (0.4 mg total) by mouth daily. Patient not taking: Reported on 08/25/2021 02/05/20   Billey Co, MD     Critical care time: 66 minutes      Bennie Pierini, MD 08/27/21 2:32 PM

## 2021-08-27 NOTE — Progress Notes (Signed)
Pt intubated and sedated, RASS -2, responds to voice. Unable to follow commands at this time. Non-purposeful movement in all extremities. Requiring less levophed than this morning. Foley intact-oliguric. Trialysis and Central line intact. OG and ETT cm marks unchanged. Tolerating trickle tube feeds so far. Arterial line attempt site in L groin still slightly oozing small dime sized amount. Pressure dressing changed. Will continue to monitor.  ?

## 2021-08-27 NOTE — Consult Note (Signed)
ANTICOAGULATION CONSULT NOTE - Initial Consult ? ?Pharmacy Consult for Heparin gtt (PTA Eliquis) ?Indication: atrial fibrillation ? ?Allergies  ?Allergen Reactions  ? Tetanus Toxoid, Adsorbed Swelling  ? Tetanus Toxoid   ?  Other reaction(s): Unknown  ? Tetanus Toxoids Swelling  ? Lisinopril Rash  ?  Other reaction(s): Unknown  ? Niacin Rash  ?  Other reaction(s): Unknown  ? Sitagliptin Rash  ?  Other reaction(s): Unknown  ? Sulfamethoxazole-Trimethoprim Rash  ?  Other reaction(s): Unknown  ? ? ?Patient Measurements: ?Height: 5\' 5"  (165.1 cm) ?Weight: (!) 156.9 kg (345 lb 14.4 oz) ?IBW/kg (Calculated) : 57 ?Heparin Dosing Weight: 96.5kg ? ?Vital Signs: ?Temp: 96.3 ?F (35.7 ?C) (03/04 1200) ?Temp Source: Esophageal (03/04 1200) ?BP: 118/78 (03/04 1246) ?Pulse Rate: 60 (03/04 1246) ? ?Labs: ?Recent Labs  ?  08/25/21 ?1128 08/25/21 ?1229 08/25/21 ?1939 08/25/21 ?2111 08/26/21 ?0005 08/26/21 ?0330 08/26/21 ?1310 08/26/21 ?1530 08/26/21 ?1600 08/27/21 ?7782 08/27/21 ?1200  ?HGB 11.8*  --   --  12.3  --  12.5  --   --   --  11.5*  --   ?HCT 37.5  --   --  39.3  --  41.2  --   --   --  36.7  --   ?PLT 120*  --   --  128*  --  140*  --   --   --  101*  --   ?APTT  --    < >  --   --   --   --   --  66*  --  117* 73*  ?LABPROT 18.5*  --   --   --   --  22.0*  --   --   --  20.4*  --   ?INR 1.6*  --   --   --   --  1.9*  --   --   --  1.8*  --   ?HEPARINUNFRC  --   --   --   --   --   --   --  >1.10*  --  >1.10*  --   ?CREATININE 2.31*  --  3.12*  --   --  3.99* 4.47*  --  3.80* 2.66*  --   ?TROPONINIHS  --   --  420* 403* 379*  --   --   --   --   --   --   ? < > = values in this interval not displayed.  ? ? ? ?Estimated Creatinine Clearance: 30.1 mL/min (A) (by C-G formula based on SCr of 2.66 mg/dL (H)). ? ? ?Medical History: ?Past Medical History:  ?Diagnosis Date  ? Bacteremia   ? CAD (coronary artery disease)   ? Diabetes mellitus without complication ([place])   ? Diastolic CHF ([place])   ? Hyperlipemia   ? Hypertension   ?  Lymphedema   ? MI (myocardial infarction) ([place])   ? OSA (obstructive sleep apnea)   ? Severe aortic stenosis   ? ? ?Medications: No AC/APT pertinent drug allergies ?Heparin Dosing Weight: 96.5kg ?PTA: Apixaban 5mg  BID (last dose on 3/02 AM PTA >> admitted on 3/02 - 1344) ?Inpatient: Apixaban (stopped w/o receiving any doses) >> Heparin gtt. ? ?Assessment: ?71yo F w/ h/o  HFpEF, severe AS (s/p TAVR), morbid obesity (BMI 57), obesity hypoventilation syndrome, OSA, DM, CAD, & HTN who [facility] due to complaints of altered mental 2/2 severe urosepsis and septic shock ISO Rt ureteral stone,  AKI, ac metabolic enceph. Pharmacy consulted for conversion of DOAC to Heparin gtt. ?    ? ?Baseline Labs: ?aPTT - 30s ?INR - 1.6>1.9 (likely eliquis and underlying AKI at present.) ?Hgb - 11.8>12.5 ?Plts - 120>140 ?Trop 377>939 ? ?Date Time aPTT/HL Rate/Comment ?3/3 1530 66  Therapeutic x 1 (heparin infusion off since 1330 for procedure) Okay to resume per Dr Jonnie Finner ?3/4  0415:  117/> 1.10 SUPRAtherapeutic  ?3/4 1200 73  Therapeutic x1 ? ?Goal of Therapy:  ?Heparin level 0.3-0.7 units/ml ?aPTT 66-102 seconds ?Monitor platelets by anticoagulation protocol: Yes ?  ?Plan:  ?Will continue heparin drip at 1200 units/hr.  ?Will recheck confirmatory aPTT level in 6 hours ?Will continue to use aPTT to guide dosing until HL and aPTT are therapeutic.  ? ?Pearla Dubonnet, PharmD ?Clinical Pharmacist ?08/27/2021 ?1:04 PM ? ? ? ? ? ?

## 2021-08-27 NOTE — Progress Notes (Signed)
RN interrogated pt pacemaker at bedside with [employer] representative Joannies guidance over the phone. Per Graciella Belton pt pacemaker is working fine.  ?

## 2021-08-27 NOTE — Progress Notes (Signed)
RN called [employer] to send a representative for pacemaker interrogation.  ?

## 2021-08-27 NOTE — Progress Notes (Signed)
Pt pacemaker spikes irregular. Per CCMD might not be sensing correctly. MD made aware. ?

## 2021-08-27 NOTE — Consult Note (Addendum)
ANTICOAGULATION CONSULT NOTE - Initial Consult ? ?Pharmacy Consult for Heparin gtt (PTA Eliquis) ?Indication: atrial fibrillation ? ?Allergies  ?Allergen Reactions  ? Tetanus Toxoid, Adsorbed Swelling  ? Tetanus Toxoid   ?  Other reaction(s): Unknown  ? Tetanus Toxoids Swelling  ? Lisinopril Rash  ?  Other reaction(s): Unknown  ? Niacin Rash  ?  Other reaction(s): Unknown  ? Sitagliptin Rash  ?  Other reaction(s): Unknown  ? Sulfamethoxazole-Trimethoprim Rash  ?  Other reaction(s): Unknown  ? ? ?Patient Measurements: ?Height: 5\' 5"  (165.1 cm) ?Weight: (!) 156.9 kg (345 lb 14.4 oz) ?IBW/kg (Calculated) : 57 ?Heparin Dosing Weight: 96.5kg ? ?Vital Signs: ?Temp: 97.5 ?F (36.4 ?C) (03/04 0500) ?Temp Source: Esophageal (03/03 1900) ?BP: 107/65 (03/04 0500) ?Pulse Rate: 60 (03/04 0500) ? ?Labs: ?Recent Labs  ?  08/25/21 ?1128 08/25/21 ?1229 08/25/21 ?1939 08/25/21 ?2111 08/26/21 ?0005 08/26/21 ?0330 08/26/21 ?1310 08/26/21 ?1530 08/26/21 ?1600 08/27/21 ?2703  ?HGB 11.8*  --   --  12.3  --  12.5  --   --   --  11.5*  ?HCT 37.5  --   --  39.3  --  41.2  --   --   --  36.7  ?PLT 120*  --   --  128*  --  140*  --   --   --  101*  ?APTT  --  30  --   --   --   --   --  66*  --  117*  ?LABPROT 18.5*  --   --   --   --  22.0*  --   --   --  20.4*  ?INR 1.6*  --   --   --   --  1.9*  --   --   --  1.8*  ?HEPARINUNFRC  --   --   --   --   --   --   --  >1.10*  --   --   ?CREATININE 2.31*  --  3.12*  --   --  3.99* 4.47*  --  3.80* 2.66*  ?TROPONINIHS  --   --  420* 403* 379*  --   --   --   --   --   ? ? ? ?Estimated Creatinine Clearance: 30.1 mL/min (A) (by C-G formula based on SCr of 2.66 mg/dL (H)). ? ? ?Medical History: ?Past Medical History:  ?Diagnosis Date  ? Bacteremia   ? CAD (coronary artery disease)   ? Diabetes mellitus without complication ([place])   ? Diastolic CHF ([place])   ? Hyperlipemia   ? Hypertension   ? Lymphedema   ? MI (myocardial infarction) ([place])   ? OSA (obstructive sleep apnea)   ? Severe aortic stenosis    ? ? ?Medications: No AC/APT pertinent drug allergies ?Heparin Dosing Weight: 96.5kg ?PTA: Apixaban 5mg  BID (last dose on 3/02 AM PTA >> admitted on 3/02 - 1344) ?Inpatient: Apixaban (stopped w/o receiving any doses) >> Heparin gtt. ? ?Assessment: ?71yo F w/ h/o  HFpEF, severe AS (s/p TAVR), morbid obesity (BMI 57), obesity hypoventilation syndrome, OSA, DM, CAD, & HTN who presented to [facility] ED on 08/25/2021 due to complaints of altered mental 2/2 severe urosepsis and septic shock ISO Rt ureteral stone, AKI, ac metabolic enceph. Pharmacy consulted for conversion of DOAC to Heparin gtt. ?    ? ?Baseline Labs: ?aPTT - 30s ?INR - 1.6>1.9 (likely eliquis and underlying AKI at present.) ?Hgb - 11.8>12.5 ?Plts - 120>140 ?  Trop 093>112 ? ?Date Time aPTT/HL Rate/Comment ?3/3 1530 Aptt 66  Therapeutic x 1 (heparin infusion off since 1330 for procedure) Okay to resume per Dr Jonnie Finner ? ?Goal of Therapy:  ?Heparin level 0.3-0.7 units/ml ?aPTT 66-102 seconds ?Monitor platelets by anticoagulation protocol: Yes ?  ?Plan:  ?3/4 @ 0415:  aPTT = 117, HL = > 1.10 , SUPRAtherapeutic  ?Will decrease heparin drip to 1200 units/hr.  ?Will recheck aPTT 6 hrs after rate change.  ?Will continue to use aPTT to guide dosing until HL and aPTT are therapeutic.  ? ?Hazelgrace Bonham D ?08/27/2021 5:45 AM ? ? ? ? ?

## 2021-08-27 NOTE — Progress Notes (Signed)
[name]  MRN: [id number]  DOB/AGE: [birth year] 71 y.o.  Primary Care Physician:Bender, Durene Cal, MD  Admit date: 08/25/2021  Chief Complaint:  Chief Complaint  Patient presents with   Altered Mental Status    S-Pt presented on  08/25/2021 with  Chief Complaint  Patient presents with   Altered Mental Status  . Patient is intubated unable to offer any complaints  Medications    chlorhexidine gluconate (MEDLINE KIT)  15 mL Mouth Rinse BID   Chlorhexidine Gluconate Cloth  6 each Topical Q0600   docusate  100 mg Per Tube BID   hydrocortisone sod succinate (SOLU-CORTEF) inj  100 mg Intravenous Q12H   insulin aspart  0-20 Units Subcutaneous Q4H   insulin aspart  4 Units Subcutaneous Q4H   mouth rinse  15 mL Mouth Rinse 10 times per day   pantoprazole (PROTONIX) IV  40 mg Intravenous Daily   polyethylene glycol  17 g Per Tube Daily   sodium chloride flush  10-40 mL Intracatheter Q12H         ROS: Unable to get any data   Physical Exam: Vital signs in last 24 hours: Temp:  [96.6 F (35.9 C)-102.6 F (39.2 C)] 97.7 F (36.5 C) (03/04 0400) Pulse Rate:  [59-63] 60 (03/04 0400) Resp:  [12-24] 12 (03/04 0400) BP: (71-147)/(43-71) 89/63 (03/04 0400) SpO2:  [94 %-100 %] 100 % (03/04 0400) FiO2 (%):  [30 %-50 %] 35 % (03/03 2019) Weight change:  Last BM Date :  (PTA)  Intake/Output from previous day: 03/03 0701 - 03/04 0700 In: 1748.3 [I.V.:1498.3; NG/GT:150; IV Piggyback:100] Out: 681 [Urine:10; Emesis/NG output:60] Total I/O In: 668.1 [I.V.:568.1; IV Piggyback:100] Out: 558 [Urine:10; Other:548]   Physical Exam:  General- pt is  Critically ill-appearing  HEENT head is atraumatic normocephalic, ET tube in situ  Resp-bilateral breath sounds present, rhonchi present  CVS- S1S2 regular in rate and rhythm  GIT- BS+, soft, Morbidly obese  EXT- No LE Edema,  No Cyanosis, chronic venous stais chnages  Access-temporary catheter in situ  Lab  Results:  CBC  Recent Labs    08/25/21 2111 08/26/21 0330  WBC 21.3* 27.7*  HGB 12.3 12.5  HCT 39.3 41.2  PLT 128* 140*    BMET  Recent Labs    08/26/21 1310 08/26/21 1600  NA 140 138  K 6.0* 4.6  CL 105 105  CO2 20* 21*  GLUCOSE 252* 200*  BUN 61* 57*  CREATININE 4.47* 3.80*  CALCIUM 8.0* 7.9*      Most recent Creatinine trend  Lab Results  Component Value Date   CREATININE 3.80 (H) 08/26/2021   CREATININE 4.47 (H) 08/26/2021   CREATININE 3.99 (H) 08/26/2021      MICRO   Recent Results (from the past 240 hour(s))  Blood Culture (routine x 2)     Status: None (Preliminary result)   Collection Time: 08/25/21 12:29 PM   Specimen: BLOOD RIGHT HAND  Result Value Ref Range Status   Specimen Description BLOOD RIGHT HAND  Final   Special Requests   Final    BOTTLES DRAWN AEROBIC AND ANAEROBIC Blood Culture results may not be optimal due to an inadequate volume of blood received in culture bottles   Culture  Setup Time   Final    Organism ID to follow [place] CRITICAL RESULT CALLED TO, READ BACK BY AND VERIFIED WITH: MORGAN HICKS AT 2207 ON 08/25/21 BY SS Performed at [facility]  Lab, [place], [place] [address]    Culture GRAM NEGATIVE RODS  Final   Report Status PENDING  Incomplete  Blood Culture ID Panel (Reflexed)     Status: Abnormal   Collection Time: 08/25/21 12:29 PM  Result Value Ref Range Status   Enterococcus faecalis NOT DETECTED NOT DETECTED Final   Enterococcus Faecium NOT DETECTED NOT DETECTED Final   Listeria monocytogenes NOT DETECTED NOT DETECTED Final   Staphylococcus species NOT DETECTED NOT DETECTED Final   Staphylococcus aureus (BCID) NOT DETECTED NOT DETECTED Final   Staphylococcus epidermidis NOT DETECTED NOT DETECTED Final   Staphylococcus lugdunensis NOT DETECTED NOT DETECTED Final   Streptococcus species NOT DETECTED NOT DETECTED Final   Streptococcus  agalactiae NOT DETECTED NOT DETECTED Final   Streptococcus pneumoniae NOT DETECTED NOT DETECTED Final   Streptococcus pyogenes NOT DETECTED NOT DETECTED Final   A.calcoaceticus-baumannii NOT DETECTED NOT DETECTED Final   Bacteroides fragilis NOT DETECTED NOT DETECTED Final   Enterobacterales DETECTED (A) NOT DETECTED Final    Comment: Enterobacterales represent a large order of gram negative bacteria, not a single organism. CRITICAL RESULT CALLED TO, READ BACK BY AND VERIFIED WITH: MORGAN HICKS AT 2207 ON 08/25/21 BY SS    Enterobacter cloacae complex NOT DETECTED NOT DETECTED Final   Escherichia coli DETECTED (A) NOT DETECTED Final    Comment: CRITICAL RESULT CALLED TO, READ BACK BY AND VERIFIED WITH: MORGAN HICKS AT 2207 ON 08/25/21 BY SS    Klebsiella aerogenes NOT DETECTED NOT DETECTED Final   Klebsiella oxytoca NOT DETECTED NOT DETECTED Final   Klebsiella pneumoniae NOT DETECTED NOT DETECTED Final   Proteus species NOT DETECTED NOT DETECTED Final   Salmonella species NOT DETECTED NOT DETECTED Final   Serratia marcescens NOT DETECTED NOT DETECTED Final   Haemophilus influenzae NOT DETECTED NOT DETECTED Final   Neisseria meningitidis NOT DETECTED NOT DETECTED Final   Pseudomonas aeruginosa NOT DETECTED NOT DETECTED Final   Stenotrophomonas maltophilia NOT DETECTED NOT DETECTED Final   Candida albicans NOT DETECTED NOT DETECTED Final   Candida auris NOT DETECTED NOT DETECTED Final   Candida glabrata NOT DETECTED NOT DETECTED Final   Candida krusei NOT DETECTED NOT DETECTED Final   Candida parapsilosis NOT DETECTED NOT DETECTED Final   Candida tropicalis NOT DETECTED NOT DETECTED Final   Cryptococcus neoformans/gattii NOT DETECTED NOT DETECTED Final   CTX-M ESBL NOT DETECTED NOT DETECTED Final   Carbapenem resistance IMP NOT DETECTED NOT DETECTED Final   Carbapenem resistance KPC NOT DETECTED NOT DETECTED Final   Carbapenem resistance NDM NOT DETECTED NOT DETECTED Final   Carbapenem  resist OXA 48 LIKE NOT DETECTED NOT DETECTED Final   Carbapenem resistance VIM NOT DETECTED NOT DETECTED Final    Comment: Performed at [facility], [address] [place], [place] [address]  Resp Panel by RT-PCR (Flu A&B, Covid) Nasopharyngeal Swab     Status: None   Collection Time: 08/25/21  3:05 PM   Specimen: Nasopharyngeal Swab; Nasopharyngeal(NP) swabs in vial transport medium  Result Value Ref Range Status   SARS Coronavirus 2 by RT PCR NEGATIVE NEGATIVE Final    Comment: (NOTE) SARS-CoV-2 target nucleic acids are NOT DETECTED.  The SARS-CoV-2 RNA is generally detectable in upper respiratory specimens during the acute phase of infection. The lowest concentration of SARS-CoV-2 viral copies this assay can detect is 138 copies/mL. A negative result does not preclude SARS-Cov-2 infection and should not be used as the sole basis for treatment or other  patient management decisions. A negative result may occur with  improper specimen collection/handling, submission of specimen other than nasopharyngeal swab, presence of viral mutation(s) within the areas targeted by this assay, and inadequate number of viral copies(<138 copies/mL). A negative result must be combined with clinical observations, patient history, and epidemiological information. The expected result is Negative.  Fact Sheet for Patients:  EntrepreneurPulse.com.au  Fact Sheet for Healthcare Providers:  IncredibleEmployment.be  This test is no t yet approved or cleared by the [place] FDA and  has been authorized for detection and/or diagnosis of SARS-CoV-2 by FDA under an Emergency Use Authorization (EUA). This EUA will remain  in effect (meaning this test can be used) for the duration of the COVID-19 declaration under Section 564(b)(1) of the Act, 21 U.S.C.section 360bbb-3(b)(1), unless the authorization is terminated  or revoked sooner.       Influenza A by PCR  NEGATIVE NEGATIVE Final   Influenza B by PCR NEGATIVE NEGATIVE Final    Comment: (NOTE) The Xpert Xpress SARS-CoV-2/FLU/RSV plus assay is intended as an aid in the diagnosis of influenza from Nasopharyngeal swab specimens and should not be used as a sole basis for treatment. Nasal washings and aspirates are unacceptable for Xpert Xpress SARS-CoV-2/FLU/RSV testing.  Fact Sheet for Patients: EntrepreneurPulse.com.au  Fact Sheet for Healthcare Providers: IncredibleEmployment.be  This test is not yet approved or cleared by the [place] FDA and has been authorized for detection and/or diagnosis of SARS-CoV-2 by FDA under an Emergency Use Authorization (EUA). This EUA will remain in effect (meaning this test can be used) for the duration of the COVID-19 declaration under Section 564(b)(1) of the Act, 21 U.S.C. section 360bbb-3(b)(1), unless the authorization is terminated or revoked.  Performed at [facility], [address] [place], [place] [address]   MRSA Next Gen by PCR, Nasal     Status: None   Collection Time: 08/25/21  6:44 PM   Specimen: Nasal Mucosa; Nasal Swab  Result Value Ref Range Status   MRSA by PCR Next Gen NOT DETECTED NOT DETECTED Final    Comment: (NOTE) The GeneXpert MRSA Assay (FDA approved for NASAL specimens only), is one component of a comprehensive MRSA colonization surveillance program. It is not intended to diagnose MRSA infection nor to guide or monitor treatment for MRSA infections. Test performance is not FDA approved in patients less than 51 years old. Performed at [facility], [address] [place], [place] [address]   Blood Culture (routine x 2)     Status: None (Preliminary result)   Collection Time: 08/25/21  7:38 PM   Specimen: BLOOD  Result Value Ref Range Status   Specimen Description BLOOD RIGHT HAND  Final   Special Requests   Final    BOTTLES DRAWN AEROBIC AND ANAEROBIC  Blood Culture adequate volume   Culture   Final    NO GROWTH < 12 HOURS Performed at [facility], [address] [place], [place] [address]    Report Status PENDING  Incomplete  Culture, Respiratory w Gram Stain     Status: None (Preliminary result)   Collection Time: 08/26/21  8:21 AM   Specimen: Tracheal Aspirate; Respiratory  Result Value Ref Range Status   Specimen Description   Final    TRACHEAL ASPIRATE Performed at [facility], [address] [place], [place] [address]    Special Requests   Final    NONE Performed at [facility], [address] [place], [place] [address]  Gram Stain   Final    RARE SQUAMOUS EPITHELIAL CELLS PRESENT RARE WBC PRESENT, PREDOMINANTLY MONONUCLEAR NO ORGANISMS SEEN Performed at [facility], [address], [place], [place] [address]    Culture PENDING  Incomplete   Report Status PENDING  Incomplete         Impression:  [name] is a 71 y.o. white female with morbid obesity, obstructive sleep apnea, diabetes mellitus type II, congestive heart failure, hypertension, hyperlipidemia, lymphedema who was admitted to [facility] on 08/29/6292 for Renal colic [T65] Sepsis ([place]) [A41.9] Urinary tract infection with hematuria, site unspecified [N39.0, R31.9] Community acquired pneumonia, unspecified laterality [J18.9] Sepsis, due to unspecified organism, unspecified whether acute organ dysfunction present [facility]) [A41.9]   1)Renal    Patient has acute kidney injury Patient has AKI secondary to ATN Patient has oliguric ATN secondary to septic shock Patient has AKI on CKD 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 coli Patient is on antibiotics Patient is on vasopressors  3)Thrombocytopenia Patient platelet counts are low This is most likely secondary to sepsis CBC Latest Ref Rng & Units 08/26/2021 08/25/2021 08/25/2021  WBC 4.0 - 10.5 K/uL 27.7(H) 21.3(H) 8.9  Hemoglobin 12.0 - 15.0 g/dL 12.5 12.3 11.8(L)  Hematocrit 36.0 - 46.0 % 41.2 39.3 37.5  Platelets 150 - 400 K/uL 140(L) 128(L) 120(L)         4) Secondary hyperparathyroidism -CKD Mineral-Bone Disorder    Lab Results  Component Value Date   CALCIUM 7.9 (L) 08/26/2021   PHOS 4.6 08/26/2021     Phosphorus at goal.   5)Hydronephrosis Patient is s/p stent placement Urology is following   6) Electrolytes   BMP Latest Ref Rng & Units 08/26/2021 08/26/2021 08/26/2021  Glucose 70 - 99 mg/dL 200(H) 252(H) 264(H)  BUN 8 - 23 mg/dL 57(H) 61(H) 57(H)  Creatinine 0.44 - 1.00 mg/dL 3.80(H) 4.47(H) 3.99(H)  Sodium 135 - 145 mmol/L 138 140 140  Potassium 3.5 - 5.1 mmol/L 4.6 6.0(H) 5.1  Chloride 98 - 111 mmol/L 105 105 109  CO2 22 - 32 mmol/L 21(L) 20(L) 21(L)  Calcium 8.9 - 10.3 mg/dL 7.9(L) 8.0(L) 8.1(L)     Sodium Normonatremic   Potassium Hyperkalemia Now better    7)Acute metabolic acidosis Patient has acute metabolic acidosis secondary to sepsis Patient lactic acid was high Patient pH is now better after starting patient on CRRT   Plan:  Patient with severe acute kidney injury, oliguria, acute metabolic acidosis, hyperkalemia, thrombocytopenia, admitted with septic shock on vasopressors and broad-spectrum antibiotics Patient is critically ill We will continue patient on CRRT We will change patient's dialysis bath responding to her electrolytes  I spent more than 50 minutes in critical care time      Herrick Hartog s Tiphanie Vo 08/27/2021, 4:24 AM

## 2021-08-27 NOTE — Consult Note (Signed)
ANTICOAGULATION CONSULT NOTE - Initial Consult ? ?Pharmacy Consult for Heparin gtt (PTA Eliquis) ?Indication: atrial fibrillation ? ?Allergies  ?Allergen Reactions  ? Tetanus Toxoid, Adsorbed Swelling  ? Tetanus Toxoid   ?  Other reaction(s): Unknown  ? Tetanus Toxoids Swelling  ? Lisinopril Rash  ?  Other reaction(s): Unknown  ? Niacin Rash  ?  Other reaction(s): Unknown  ? Sitagliptin Rash  ?  Other reaction(s): Unknown  ? Sulfamethoxazole-Trimethoprim Rash  ?  Other reaction(s): Unknown  ? ? ?Patient Measurements: ?Height: 5\' 5"  (165.1 cm) ?Weight: (!) 156.9 kg (345 lb 14.4 oz) ?IBW/kg (Calculated) : 57 ?Heparin Dosing Weight: 96.5kg ? ?Vital Signs: ?Temp: 97.9 ?F (36.6 ?C) (03/04 2000) ?Temp Source: Axillary (03/04 2000) ?BP: 119/63 (03/04 2000) ?Pulse Rate: 59 (03/04 2000) ? ?Labs: ?Recent Labs  ?  08/25/21 ?1128 08/25/21 ?1229 08/25/21 ?1939 08/25/21 ?2111 08/26/21 ?0005 08/26/21 ?0330 08/26/21 ?1310 08/26/21 ?1530 08/26/21 ?1600 08/27/21 ?3244 08/27/21 ?1200 08/27/21 ?1600 08/27/21 ?1800  ?HGB 11.8*  --   --  12.3  --  12.5  --   --   --  11.5*  --   --   --   ?HCT 37.5  --   --  39.3  --  41.2  --   --   --  36.7  --   --   --   ?PLT 120*  --   --  128*  --  140*  --   --   --  101*  --   --   --   ?APTT  --    < >  --   --   --   --   --  66*  --  117* 73*  --  64*  ?LABPROT 18.5*  --   --   --   --  22.0*  --   --   --  20.4*  --   --   --   ?INR 1.6*  --   --   --   --  1.9*  --   --   --  1.8*  --   --   --   ?HEPARINUNFRC  --   --   --   --   --   --   --  >1.10*  --  >1.10*  --   --   --   ?CREATININE 2.31*  --  3.12*  --   --  3.99*   < >  --  3.80* 2.66*  --  1.69*  --   ?TROPONINIHS  --   --  420* 403* 379*  --   --   --   --   --   --   --   --   ? < > = values in this interval not displayed.  ? ? ? ?Estimated Creatinine Clearance: 47.4 mL/min (A) (by C-G formula based on SCr of 1.69 mg/dL (H)). ? ? ?Medical History: ?Past Medical History:  ?Diagnosis Date  ? Bacteremia   ? CAD (coronary artery  disease)   ? Diabetes mellitus without complication ([place])   ? Diastolic CHF ([place])   ? Hyperlipemia   ? Hypertension   ? Lymphedema   ? MI (myocardial infarction) ([place])   ? OSA (obstructive sleep apnea)   ? Severe aortic stenosis   ? ? ?Medications: No AC/APT pertinent drug allergies ?Heparin Dosing Weight: 96.5kg ?PTA: Apixaban 5mg  BID (last dose on 3/02 AM PTA >> admitted on 3/02 - 1344) ?  Inpatient: Apixaban (stopped w/o receiving any doses) >> Heparin gtt. ? ?Assessment: ?71yo F w/ h/o  HFpEF, severe AS (s/p TAVR), morbid obesity (BMI 57), obesity hypoventilation syndrome, OSA, DM, CAD, & HTN who presented to [facility] ED on [facility]/25/2021 due to complaints of altered mental 2/2 severe urosepsis and septic shock ISO Rt ureteral stone, AKI, ac metabolic enceph. Pharmacy consulted for conversion of DOAC to Heparin gtt. ?    ? ?Baseline Labs: ?aPTT - 30s ?INR - 1.6>1.9 (likely eliquis and underlying AKI at present.) ?Hgb - 11.8>12.5 ?Plts - 120>140 ?Trop 897>915 ? ?Date Time aPTT/HL Rate/Comment ?3/3 1530 66  Therapeutic x 1 (heparin infusion off since 1330 for procedure) Okay to resume per Dr Jonnie Finner ?3/4  0415:  117/> 1.10 SUPRAtherapeutic  ?3/4 1200 73  Therapeutic x1 ?3/4 1800 64  Subtherapeutic ? ?Goal of Therapy:  ?Heparin level 0.3-0.7 units/ml ?aPTT 66-102 seconds ?Monitor platelets by anticoagulation protocol: Yes ?  ?Plan:  ? Heparin is borderline subtherapeutic, so will bolus with 1500 units x1 and increase heparin drip to 1300 units/hr. ?Will recheck confirmatory aPTT level in 6 hours ?Will continue to use aPTT to guide dosing until HL and aPTT are therapeutic.  ? ?Darrick Penna, PharmD ?Clinical Pharmacist ?08/27/2021 ?8:08 PM ? ? ? ? ? ?

## 2021-08-28 DIAGNOSIS — A4151 Sepsis due to Escherichia coli [E. coli]: Secondary | ICD-10-CM | POA: Diagnosis not present

## 2021-08-28 DIAGNOSIS — G9341 Metabolic encephalopathy: Secondary | ICD-10-CM | POA: Diagnosis not present

## 2021-08-28 DIAGNOSIS — E119 Type 2 diabetes mellitus without complications: Secondary | ICD-10-CM | POA: Diagnosis not present

## 2021-08-28 DIAGNOSIS — N179 Acute kidney failure, unspecified: Secondary | ICD-10-CM | POA: Diagnosis not present

## 2021-08-28 LAB — BLOOD GAS, ARTERIAL
Acid-base deficit: 9.7 mmol/L — ABNORMAL HIGH (ref 0.0–2.0)
Bicarbonate: 16.6 mmol/L — ABNORMAL LOW (ref 20.0–28.0)
FIO2: 35 %
MECHVT: 460 mL
O2 Saturation: 99 %
PEEP: 5 cmH2O
Patient temperature: 37
RATE: 22 resp/min
pCO2 arterial: 37 mmHg (ref 32–48)
pH, Arterial: 7.26 — ABNORMAL LOW (ref 7.35–7.45)
pO2, Arterial: 97 mmHg (ref 83–108)

## 2021-08-28 LAB — CULTURE, BLOOD (ROUTINE X 2)

## 2021-08-28 LAB — GLUCOSE, CAPILLARY
Glucose-Capillary: 211 mg/dL — ABNORMAL HIGH (ref 70–99)
Glucose-Capillary: 154 mg/dL — ABNORMAL HIGH (ref 70–99)
Glucose-Capillary: 189 mg/dL — ABNORMAL HIGH (ref 70–99)
Glucose-Capillary: 173 mg/dL — ABNORMAL HIGH (ref 70–99)
Glucose-Capillary: 162 mg/dL — ABNORMAL HIGH (ref 70–99)
Glucose-Capillary: 185 mg/dL — ABNORMAL HIGH (ref 70–99)

## 2021-08-28 LAB — RENAL FUNCTION PANEL
Albumin: 2.5 g/dL — ABNORMAL LOW (ref 3.5–5.0)
Albumin: 2.4 g/dL — ABNORMAL LOW (ref 3.5–5.0)
Anion gap: 8 (ref 5–15)
Anion gap: 9 (ref 5–15)
BUN: 26 mg/dL — ABNORMAL HIGH (ref 8–23)
BUN: 26 mg/dL — ABNORMAL HIGH (ref 8–23)
CO2: 25 mmol/L (ref 22–32)
CO2: 25 mmol/L (ref 22–32)
Calcium: 9.6 mg/dL (ref 8.9–10.3)
Calcium: 8.4 mg/dL — ABNORMAL LOW (ref 8.9–10.3)
Chloride: 101 mmol/L (ref 98–111)
Chloride: 103 mmol/L (ref 98–111)
Creatinine, Ser: 1.37 mg/dL — ABNORMAL HIGH (ref 0.44–1.00)
Creatinine, Ser: 1.2 mg/dL — ABNORMAL HIGH (ref 0.44–1.00)
GFR, Estimated: 42 mL/min — ABNORMAL LOW (ref >60)
GFR, Estimated: 49 mL/min — ABNORMAL LOW (ref >60)
Glucose, Bld: 196 mg/dL — ABNORMAL HIGH (ref 70–99)
Glucose, Bld: 177 mg/dL — ABNORMAL HIGH (ref 70–99)
Phosphorus: 2.4 mg/dL — ABNORMAL LOW (ref 2.5–4.6)
Phosphorus: 1.7 mg/dL — ABNORMAL LOW (ref 2.5–4.6)
Potassium: 3.2 mmol/L — ABNORMAL LOW (ref 3.5–5.1)
Potassium: 3.6 mmol/L (ref 3.5–5.1)
Sodium: 134 mmol/L — ABNORMAL LOW (ref 135–145)
Sodium: 137 mmol/L (ref 135–145)

## 2021-08-28 LAB — APTT
aPTT: 54 seconds — ABNORMAL HIGH (ref 24–36)
aPTT: 61 seconds — ABNORMAL HIGH (ref 24–36)
aPTT: 77 seconds — ABNORMAL HIGH (ref 24–36)
aPTT: 79 seconds — ABNORMAL HIGH (ref 24–36)

## 2021-08-28 LAB — CBC
HCT: 35.7 % — ABNORMAL LOW (ref 36.0–46.0)
Hemoglobin: 11 g/dL — ABNORMAL LOW (ref 12.0–15.0)
MCH: 28.8 pg (ref 26.0–34.0)
MCHC: 30.8 g/dL (ref 30.0–36.0)
MCV: 93.5 fL (ref 80.0–100.0)
Platelets: 94 10*3/uL — ABNORMAL LOW (ref 150–400)
RBC: 3.82 MIL/uL — ABNORMAL LOW (ref 3.87–5.11)
RDW: 14.9 % (ref 11.5–15.5)
WBC: 16.1 10*3/uL — ABNORMAL HIGH (ref 4.0–10.5)
nRBC: 0.5 % — ABNORMAL HIGH (ref 0.0–0.2)

## 2021-08-28 LAB — MAGNESIUM: Magnesium: 1.9 mg/dL (ref 1.7–2.4)

## 2021-08-28 LAB — URINE CULTURE: Culture: >=100000 — AB

## 2021-08-28 LAB — HEPARIN LEVEL (UNFRACTIONATED): Heparin Unfractionated: 0.77 IU/mL — ABNORMAL HIGH (ref 0.30–0.70)

## 2021-08-28 MED ORDER — K PHOS MONO-SOD PHOS DI & MONO 155-852-130 MG PO TABS
500.0000 mg | ORAL_TABLET | Freq: Once | ORAL | Status: AC
  Administered 2021-08-28: 500 mg via ORAL
  Filled 2021-08-28: qty 2

## 2021-08-28 MED ORDER — PRISMASOL BGK 4/2.5 32-4-2.5 MEQ/L REPLACEMENT SOLN
Status: DC
  Filled 2021-08-28: qty 5000

## 2021-08-28 MED ORDER — HEPARIN BOLUS VIA INFUSION
3000.0000 [IU] | Freq: Once | INTRAVENOUS | Status: AC
  Administered 2021-08-28: 3000 [IU] via INTRAVENOUS
  Filled 2021-08-28: qty 3000

## 2021-08-28 MED ORDER — DEXMEDETOMIDINE HCL IN NACL 400 MCG/100ML IV SOLN
0.1000 ug/kg/h | INTRAVENOUS | Status: DC
  Administered 2021-08-28: 0.4 ug/kg/h via INTRAVENOUS
  Filled 2021-08-28: qty 100

## 2021-08-28 MED ORDER — PRISMASOL BGK 4/2.5 32-4-2.5 MEQ/L EC SOLN: Status: DC

## 2021-08-28 MED ORDER — HEPARIN BOLUS VIA INFUSION
1500.0000 [IU] | Freq: Once | INTRAVENOUS | Status: AC
  Administered 2021-08-28: 1500 [IU] via INTRAVENOUS
  Filled 2021-08-28: qty 1500

## 2021-08-28 NOTE — Progress Notes (Signed)
Sedation remains off. Pt not interactive, barely opens eyes. Non-purposeful movement in all extremities. Pupils equal, round, brisk. MD aware. Will continue to monitor. Restraints removed. Trying safety mitts and close observation. Tolerating CRRT. Tube feeds paused while waiting for pt to awaken. ?

## 2021-08-28 NOTE — Progress Notes (Signed)
[name]  MRN: [id number]  DOB/AGE: [birth year] 71 y.o.  Primary Care Physician:Bender, Earl Lagos, MD  Admit date: 08/25/2021  Chief Complaint:  Chief Complaint  Patient presents with   Altered Mental Status    S-Pt presented on  08/25/2021 with  Chief Complaint  Patient presents with   Altered Mental Status  . Patient is intubated unable to offer any complaints  Medications    chlorhexidine gluconate (MEDLINE KIT)  15 mL Mouth Rinse BID   Chlorhexidine Gluconate Cloth  6 each Topical Q0600   docusate  100 mg Per Tube BID   feeding supplement (PROSource TF)  45 mL Per Tube BID   feeding supplement (VITAL HIGH PROTEIN)  1,000 mL Per Tube Q24H   free water  20 mL Per Tube Q4H   hydrocortisone sod succinate (SOLU-CORTEF) inj  100 mg Intravenous Q12H   insulin aspart  0-20 Units Subcutaneous Q4H   insulin aspart  4 Units Subcutaneous Q4H   insulin glargine-yfgn  15 Units Subcutaneous Daily   mouth rinse  15 mL Mouth Rinse 10 times per day   pantoprazole (PROTONIX) IV  40 mg Intravenous Daily   polyethylene glycol  17 g Per Tube Daily   sodium chloride flush  10-40 mL Intracatheter Q12H         ROS: Unable to get any data   Physical Exam: Vital signs in last 24 hours: Temp:  [96.3 F (35.7 C)-97.9 F (36.6 C)] 97.9 F (36.6 C) (03/05 0730) Pulse Rate:  [58-70] 59 (03/05 0730) Resp:  [0-23] 22 (03/05 0730) BP: (69-148)/(36-89) 102/65 (03/05 0730) SpO2:  [96 %-100 %] 97 % (03/05 0733) FiO2 (%):  [28 %] 28 % (03/05 0733) Weight:  [155.5 kg] 155.5 kg (03/05 0456) Weight change: -1.4 kg Last BM Date :  (pta)  Intake/Output from previous day: 03/04 0701 - 03/05 0700 In: 1985.6 [I.V.:1114.3; NG/GT:761.3; IV Piggyback:100] Out: 2008 [Urine:149] No intake/output data recorded.   Physical Exam:  General- pt is  Critically ill-appearing  HEENT head is atraumatic normocephalic, ET tube in situ  Resp-bilateral breath sounds present, rhonchi  present  CVS- S1S2 regular in rate and rhythm  GIT- BS+, soft, Morbidly obese  EXT- No LE Edema,  No Cyanosis, chronic venous stais chnages  Access-temporary catheter in situ left IJ  Lab Results:  CBC  Recent Labs    08/27/21 0415 08/28/21 0453  WBC 20.1* 16.1*  HGB 11.5* 11.0*  HCT 36.7 35.7*  PLT 101* 94*    BMET  Recent Labs    08/27/21 1600 08/28/21 0453  NA 135 134*  K 3.6 3.2*  CL 103 101  CO2 24 25  GLUCOSE 170* 196*  BUN 28* 26*  CREATININE 1.69* 1.37*  CALCIUM 8.6* 9.6      Most recent Creatinine trend  Lab Results  Component Value Date   CREATININE 1.37 (H) 08/28/2021   CREATININE 1.69 (H) 08/27/2021   CREATININE 2.66 (H) 08/27/2021      MICRO   Recent Results (from the past 240 hour(s))  Blood Culture (routine x 2)     Status: Abnormal   Collection Time: 08/25/21 12:29 PM   Specimen: BLOOD RIGHT HAND  Result Value Ref Range Status   Specimen Description   Final    BLOOD RIGHT HAND Performed at [facility], [address] [place], [place] [address]    Special Requests   Final    BOTTLES DRAWN AEROBIC AND ANAEROBIC Blood Culture results may not  be optimal due to an inadequate volume of blood received in culture bottles Performed at [facility], [address] [place], [place] [address]    Culture  Setup Time   Final    GRAM NEGATIVE RODS IN BOTH AEROBIC AND ANAEROBIC BOTTLES CRITICAL RESULT CALLED TO, READ BACK BY AND VERIFIED WITH: MORGAN HICKS AT 2207 ON 08/25/21 BY SS Performed at [facility], [address], [place], [place] [address]    Culture ESCHERICHIA COLI (A)  Final   Report Status 08/28/2021 FINAL  Final   Organism ID, Bacteria ESCHERICHIA COLI  Final      Susceptibility   Escherichia coli - MIC*    AMPICILLIN 8 SENSITIVE Sensitive     CEFAZOLIN <=4 SENSITIVE Sensitive     CEFEPIME <=0.12 SENSITIVE Sensitive     CEFTAZIDIME <=1 SENSITIVE Sensitive     CEFTRIAXONE <=0.25 SENSITIVE  Sensitive     CIPROFLOXACIN <=0.25 SENSITIVE Sensitive     GENTAMICIN <=1 SENSITIVE Sensitive     IMIPENEM <=0.25 SENSITIVE Sensitive     TRIMETH/SULFA <=20 SENSITIVE Sensitive     AMPICILLIN/SULBACTAM <=2 SENSITIVE Sensitive     PIP/TAZO <=4 SENSITIVE Sensitive     * ESCHERICHIA COLI  Blood Culture ID Panel (Reflexed)     Status: Abnormal   Collection Time: 08/25/21 12:29 PM  Result Value Ref Range Status   Enterococcus faecalis NOT DETECTED NOT DETECTED Final   Enterococcus Faecium NOT DETECTED NOT DETECTED Final   Listeria monocytogenes NOT DETECTED NOT DETECTED Final   Staphylococcus species NOT DETECTED NOT DETECTED Final   Staphylococcus aureus (BCID) NOT DETECTED NOT DETECTED Final   Staphylococcus epidermidis NOT DETECTED NOT DETECTED Final   Staphylococcus lugdunensis NOT DETECTED NOT DETECTED Final   Streptococcus species NOT DETECTED NOT DETECTED Final   Streptococcus agalactiae NOT DETECTED NOT DETECTED Final   Streptococcus pneumoniae NOT DETECTED NOT DETECTED Final   Streptococcus pyogenes NOT DETECTED NOT DETECTED Final   A.calcoaceticus-baumannii NOT DETECTED NOT DETECTED Final   Bacteroides fragilis NOT DETECTED NOT DETECTED Final   Enterobacterales DETECTED (A) NOT DETECTED Final    Comment: Enterobacterales represent a large order of gram negative bacteria, not a single organism. CRITICAL RESULT CALLED TO, READ BACK BY AND VERIFIED WITH: MORGAN HICKS AT 2207 ON 08/25/21 BY SS    Enterobacter cloacae complex NOT DETECTED NOT DETECTED Final   Escherichia coli DETECTED (A) NOT DETECTED Final    Comment: CRITICAL RESULT CALLED TO, READ BACK BY AND VERIFIED WITH: MORGAN HICKS AT 2207 ON 08/25/21 BY SS    Klebsiella aerogenes NOT DETECTED NOT DETECTED Final   Klebsiella oxytoca NOT DETECTED NOT DETECTED Final   Klebsiella pneumoniae NOT DETECTED NOT DETECTED Final   Proteus species NOT DETECTED NOT DETECTED Final   Salmonella species NOT DETECTED NOT DETECTED Final    Serratia marcescens NOT DETECTED NOT DETECTED Final   Haemophilus influenzae NOT DETECTED NOT DETECTED Final   Neisseria meningitidis NOT DETECTED NOT DETECTED Final   Pseudomonas aeruginosa NOT DETECTED NOT DETECTED Final   Stenotrophomonas maltophilia NOT DETECTED NOT DETECTED Final   Candida albicans NOT DETECTED NOT DETECTED Final   Candida auris NOT DETECTED NOT DETECTED Final   Candida glabrata NOT DETECTED NOT DETECTED Final   Candida krusei NOT DETECTED NOT DETECTED Final   Candida parapsilosis NOT DETECTED NOT DETECTED Final   Candida tropicalis NOT DETECTED NOT DETECTED Final   Cryptococcus neoformans/gattii NOT DETECTED NOT DETECTED Final   CTX-M ESBL NOT DETECTED NOT  DETECTED Final   Carbapenem resistance IMP NOT DETECTED NOT DETECTED Final   Carbapenem resistance KPC NOT DETECTED NOT DETECTED Final   Carbapenem resistance NDM NOT DETECTED NOT DETECTED Final   Carbapenem resist OXA 48 LIKE NOT DETECTED NOT DETECTED Final   Carbapenem resistance VIM NOT DETECTED NOT DETECTED Final    Comment: Performed at [facility], [address] [place], [place] [address]  Urine Culture     Status: Abnormal (Preliminary result)   Collection Time: 08/25/21  1:47 PM   Specimen: In/Out Cath Urine  Result Value Ref Range Status   Specimen Description   Final    IN/OUT CATH URINE Performed at [facility], [address], [place], [place] [address]    Special Requests   Final    NONE Performed at [facility], [address], [place], [place] [address]    Culture >=100,000 COLONIES/mL ESCHERICHIA COLI (A)  Final   Report Status PENDING  Incomplete  Resp Panel by RT-PCR (Flu A&B, Covid) Nasopharyngeal Swab     Status: None   Collection Time: 08/25/21  3:05 PM   Specimen: Nasopharyngeal Swab; Nasopharyngeal(NP) swabs in vial transport medium  Result Value Ref Range Status   SARS Coronavirus 2 by RT PCR NEGATIVE NEGATIVE Final    Comment: (NOTE) SARS-CoV-2  target nucleic acids are NOT DETECTED.  The SARS-CoV-2 RNA is generally detectable in upper respiratory specimens during the acute phase of infection. The lowest concentration of SARS-CoV-2 viral copies this assay can detect is 138 copies/mL. A negative result does not preclude SARS-Cov-2 infection and should not be used as the sole basis for treatment or other patient management decisions. A negative result may occur with  improper specimen collection/handling, submission of specimen other than nasopharyngeal swab, presence of viral mutation(s) within the areas targeted by this assay, and inadequate number of viral copies(<138 copies/mL). A negative result must be combined with clinical observations, patient history, and epidemiological information. The expected result is Negative.  Fact Sheet for Patients:  BloggerCourse.com  Fact Sheet for Healthcare Providers:  SeriousBroker.it  This test is no t yet approved or cleared by the [place] FDA and  has been authorized for detection and/or diagnosis of SARS-CoV-2 by FDA under an Emergency Use Authorization (EUA). This EUA will remain  in effect (meaning this test can be used) for the duration of the COVID-19 declaration under Section 564(b)(1) of the Act, 21 U.S.C.section 360bbb-3(b)(1), unless the authorization is terminated  or revoked sooner.       Influenza A by PCR NEGATIVE NEGATIVE Final   Influenza B by PCR NEGATIVE NEGATIVE Final    Comment: (NOTE) The Xpert Xpress SARS-CoV-2/FLU/RSV plus assay is intended as an aid in the diagnosis of influenza from Nasopharyngeal swab specimens and should not be used as a sole basis for treatment. Nasal washings and aspirates are unacceptable for Xpert Xpress SARS-CoV-2/FLU/RSV testing.  Fact Sheet for Patients: BloggerCourse.com  Fact Sheet for Healthcare  Providers: SeriousBroker.it  This test is not yet approved or cleared by the [place] FDA and has been authorized for detection and/or diagnosis of SARS-CoV-2 by FDA under an Emergency Use Authorization (EUA). This EUA will remain in effect (meaning this test can be used) for the duration of the COVID-19 declaration under Section 564(b)(1) of the Act, 21 U.S.C. section 360bbb-3(b)(1), unless the authorization is terminated or revoked.  Performed at [facility], [address] [place], [place] [address]   MRSA Next Gen by PCR,  Nasal     Status: None   Collection Time: 08/25/21  6:44 PM   Specimen: Nasal Mucosa; Nasal Swab  Result Value Ref Range Status   MRSA by PCR Next Gen NOT DETECTED NOT DETECTED Final    Comment: (NOTE) The GeneXpert MRSA Assay (FDA approved for NASAL specimens only), is one component of a comprehensive MRSA colonization surveillance program. It is not intended to diagnose MRSA infection nor to guide or monitor treatment for MRSA infections. Test performance is not FDA approved in patients less than 53 years old. Performed at [facility], [address] [place], [place] [address]   Blood Culture (routine x 2)     Status: None (Preliminary result)   Collection Time: 08/25/21  7:38 PM   Specimen: BLOOD  Result Value Ref Range Status   Specimen Description BLOOD RIGHT HAND  Final   Special Requests   Final    BOTTLES DRAWN AEROBIC AND ANAEROBIC Blood Culture adequate volume   Culture   Final    NO GROWTH 3 DAYS Performed at [facility], [address] [place], [place] [address]    Report Status PENDING  Incomplete  Culture, Respiratory w Gram Stain     Status: None (Preliminary result)   Collection Time: 08/26/21  8:21 AM   Specimen: Tracheal Aspirate; Respiratory  Result Value Ref Range Status   Specimen Description   Final    TRACHEAL ASPIRATE Performed at [facility], [address] [place], [place] [address]    Special Requests   Final    NONE Performed at [facility], [address] [place], [place] [address]    Gram Stain   Final    RARE SQUAMOUS EPITHELIAL CELLS PRESENT RARE WBC PRESENT, PREDOMINANTLY MONONUCLEAR NO ORGANISMS SEEN    Culture   Final    NO GROWTH < 12 HOURS Performed at [facility], [address] [place], [place] [address]    Report Status PENDING  Incomplete         Impression:  Ms. Azelle Balbach is a 71 y.o. white female with morbid obesity, obstructive sleep apnea, diabetes mellitus type II, congestive heart failure, hypertension, hyperlipidemia, lymphedema who was admitted to [facility] on 08/25/2021 for Renal colic [N23] Sepsis (HCC) [A41.9] Urinary tract infection with hematuria, site unspecified [N39.0, R31.9] Community acquired pneumonia, unspecified laterality [J18.9] Sepsis, due to unspecified organism, unspecified whether acute organ dysfunction present [facility]) [A41.9]   1)Renal    Patient has acute kidney injury Patient has AKI secondary to ATN Patient has oliguric ATN secondary to septic shock Patient has AKI on CKD 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 coli Patient is on antibiotics Patient is on vasopressors  3)Thrombocytopenia Patient platelet counts are low This is most likely secondary to sepsis CBC Latest Ref Rng & Units 08/28/2021 08/27/2021 08/26/2021  WBC 4.0 - 10.5 K/uL 16.1(H) 20.1(H) 27.7(H)  Hemoglobin 12.0 - 15.0 g/dL 11.0(L) 11.5(L) 12.5  Hematocrit 36.0 - 46.0 % 35.7(L) 36.7 41.2  Platelets 150 - 400 K/uL 94(L) 101(L) 140(L)         4) Secondary  hyperparathyroidism -CKD Mineral-Bone Disorder    Lab  Results  Component Value Date   CALCIUM 9.6 08/28/2021   PHOS 2.4 (L) 08/28/2021     Phosphorus at goal.   5)Hydronephrosis Patient is s/p stent placement Urology is following   6) Electrolytes   BMP Latest Ref Rng & Units 08/28/2021 08/27/2021 08/27/2021  Glucose 70 - 99 mg/dL 191(Y) 782(N) 562(Z)  BUN 8 - 23 mg/dL 30(Q) 65(H) 84(O)  Creatinine 0.44 - 1.00 mg/dL 9.62(X) 5.28(U) 1.32(G)  Sodium 135 - 145 mmol/L 134(L) 135 137  Potassium 3.5 - 5.1 mmol/L 3.2(L) 3.6 3.9  Chloride 98 - 111 mmol/L 101 103 104  CO2 22 - 32 mmol/L 25 24 24   Calcium 8.9 - 10.3 mg/dL 9.6 4.0(N) 8.3(L)     Sodium Normonatremic   Potassium Patient earlier was hyperkalemic and was being dialyzed on a lower K bath patient is now hypokalemic we will change the bath for CRRT    7)Acute metabolic acidosis Patient has acute metabolic acidosis secondary to sepsis Patient lactic acid was high Patient pH is now better after starting patient on CRRT   8) acute respiratory failure Patient remains intubated Pulmonary team is following   Plan:  Patient with severe acute kidney injury, oliguria, acute metabolic acidosis,  Acute respiratory failure,hypokalemia, thrombocytopenia, admitted with septic shock on vasopressors and broad-spectrum antibiotics  Patient is critically ill We will continue patient on CRRT We will change patient's dialysis bath responding to her electrolytes  I spent more than 40 minutes in critical care time      Conleigh Heinlein s Mea Ozga 08/28/2021, 8:02 AM

## 2021-08-28 NOTE — Progress Notes (Signed)
NAME:  [name], MRN:  [id number], DOB:  1950/11/23, LOS: 3 ADMISSION DATE:  08/25/2021, CONSULTATION DATE:  08/25/2021 REFERRING MD:  Dr. Francine Graven, CHIEF COMPLAINT:  Altered Mental Status   Brief Pt Description / Synopsis:  71 y.o. Female admitted with Severe Urosepsis with Septic Shock in the setting of Right proximal ureteral stone with Hydronephrosis & large left renal stone, Acute Kidney Injury, and Acute Metabolic Encephalopathy.  She underwent urgent cystoscopy with bilateral ureteral stent placement.  She returns to ICU post procedure and remains intubated.  History of Present Illness:  [name] is a 71 y.o. female with a past medical history significant for HFpEF, severe aortic stenosis status post TAVR, morbid obesity, obesity hypoventilation syndrome, OSA, diabetes mellitus, coronary artery disease, hypertension who presented to [facility] ED on 08/25/2021 due to complaints of altered mental status.  Patient is currently intubated and sedated following return from the OR and no family is available, therefore history is obtained from chart review.  ED Course: Initial vital signs: Temperature 99.2, respiratory rate 27, pulse , blood pressure  Significant Labs: Potassium 3.4, bicarbonate 22, glucose 182, BUN 46, creatinine 2.31, calcium 8.5, anion gap 10, albumin 2.9, AST 72, ALT 36, total bilirubin 5.7, alkaline phosphatase 45, WBC 8.9, hemoglobin 11.8, hematocrit 37.5, platelets 120, PT 18.5, INR 1.6, lactic acid 4.3 COVID-19 PCR is negative Urinalysis consistent with UTI Imaging: Chest x-ray>>IMPRESSION: Mild right basilar subsegmental atelectasis or infiltrate is noted. CT chest>>IMPRESSION: Bilateral perihilar airspace filling pattern left more than right most consistent with acute edema/ARDS. Pneumonia not excluded, but there is no dense consolidation, lobar distribution or collapse. No effusion. Aortic Atherosclerosis CT abdomen pelvis>>IMPRESSION: Hydronephrosis on the right  due to 2 adjacent 6 mm stones in the proximal right ureter, at about the level of the lower pole of the kidney. Possibility of infection associated with the obstruction does exist. 1 cm stone in the left renal pelvis without evidence of obstruction presently. Chololithiasis without CT evidence of cholecystitis or obstruction. Insignificant 13 mm adrenal adenoma, unchanged since previous exam. Medications administered: 2.5 L of LR boluses, IV cefepime, Flagyl, vancomycin   Hospitalist were asked to admit for further work-up and treatment of severe urosepsis due to right proximal ureter stone with hydronephrosis and large left renal stone.  Urology was consulted.  She was taken urgently to the OR for cystoscopy with bilateral ureteral stent placement.  Anesthesia does report she required vasopressors Intra-Op.  She returns to ICU post procedure and remains intubated.  PCCM consulted for management of mechanical ventilation and critical illness.  Pertinent  Medical History  HFpEF Severe aortic stenosis status post TAVR Coronary artery disease Hypertension Hyperlipidemia Obesity hypoventilation syndrome OSA Morbid obesity Diabetes mellitus  Micro Data:  08/25/2021: SARS-CoV-2 and influenza PCR>> negative 08/25/2021: Blood culture>>+E Coli 08/25/2021: Urine>>+E coli 08/25/2021: Tracheal aspirate>> 08/25/2021: Strep pneumo urinary antigen>>negative 08/25/2021: Legionella urine antigen>>  Antimicrobials:  Cefepime x1 dose 3/2 Flagyl x1 dose 3/2 Vancomycin x1 dose 3/2 Ceftriaxone 3/2>>  Significant Hospital Events: Including procedures, antibiotic start and stop dates in addition to other pertinent events   3/2: Presented to ED, admitted by hospitalist.  Urology consulted, taken emergently to the OR for cystoscopy and bilateral ureteral stent placement.  Returns to ICU postop and remains intubated, PCCM consulted 3/3: worsening shock, added vasopressin; started CRRT 3/4: shock improving; on CRRT;  minimal vent settings  Interim History / Subjective:  Shock improving, now off Levophed. Remains on CRRT. On minimal vent settings. Not following commands  yet but still working down on sedation. E coli in blood and urine is pan-sensitive.  Objective   Blood pressure 114/75, pulse 60, temperature 97.9 F (36.6 C), temperature source Axillary, resp. rate (!) 22, height 5' 5"  (1.651 m), weight (!) 155.5 kg, SpO2 100 %. CVP:  [7 mmHg-31 mmHg] 14 mmHg  Vent Mode: PRVC FiO2 (%):  [28 %] 28 % Set Rate:  [22 bmp] 22 bmp Vt Set:  [460 mL] 460 mL PEEP:  [5 cmH20] 5 cmH20 Plateau Pressure:  [22 cmH20] 22 cmH20   Intake/Output Summary (Last 24 hours) at 08/28/2021 1655 Last data filed at 08/28/2021 0900 Gross per 24 hour  Intake 1991.58 ml  Output 2039 ml  Net -47.42 ml   Filed Weights   08/25/21 1835 08/27/21 0428 08/28/21 0456  Weight: (!) 155.4 kg (!) 156.9 kg (!) 155.5 kg    Examination: General: Caucasian female in NAD, intubated and sedated Lungs: decreased at the bases bilaterally, otherwise clear Cardiovascular: Regular rate and rhythm, S1-S2, no murmurs, rubs, gallops Abdomen: Morbidly obese, soft, nontender, nondistended, no guarding rebound tenderness, distant bowel sounds Extremities: No deformities, chronic venous trophic changes to BLE  Resolved Hospital Problem list     Assessment & Plan:   Septic Shock 2/2 E coli pyelonephritis with bacteremia Bilateral ureteral stones with severe left hydronephrosis s/p b/l ureterals stent placement (08/25/21) -Wean pressors as able; off vasopressin, cont Levo -MAP goal >65 -Stress dose steroids until off pressors -Continue ceftriaxone 2 g daily for planned 7 day course  Chronic HFpEF Demand ischemia Paroxsymal Atrial Fibrillation PMHx: Severe aortic stenosis s/p TAVR, CAD, MI, Hypertension, Hyperlipidemia -Continuous cardiac monitoring -Maintain MAP >65 as above -Vasopressors as needed to maintain MAP goal -EF down to 45-50% from  50-55% prior in setting of acute infection -Optimize sepsis and ensure GDMT with Cardiology follow up -Heparin gtt for Afib  Acute Hypoxic Respiratory Failure in the setting of septic shock, multiple metabolic derangements PMHx: OSA -Full vent support, implement lung protective strategies -Plateau pressures less than 30 cm H20 -Wean FiO2 & PEEP as tolerated to maintain O2 sats >92% -Follow intermittent Chest X-ray & ABG as needed -Spontaneous Breathing Trials when respiratory parameters met and mental status permits -Implement VAP Bundle -Prn Bronchodilators  Acute Kidney Injury Anion Gap Metabolic Acidosis in the setting of Lactic Acidosis and AKI -Nephrology consulted -Continue CRRT; goal UF 0 -Avoid nephrotoxins -Renally dose medications -Strict I/O; monitor UOP  Mild Thrombocytopenia, likely in setting of septic shock -Monitor for S/Sx of bleeding -Trend CBC -Heparin as above -Transfuse for Hgb <7  Acute Metabolic Encephalopathy Sedation needs in the setting of mechanical ventilation -Maintain a RASS goal of 0 to -1 -Daily wake up assessment, SBT today -Treat sepsis and metabolic derangements as above  Diabetes Mellitus -CBG's q4h; Target range of 140 to 180 -SSI -Follow ICU Hypo/Hyperglycemia protocol   Best Practice (right click and "Reselect all SmartList Selections" daily)   Diet/type: NPO; tube feeds DVT prophylaxis: systemic heparin GI prophylaxis: PPI Lines: Central line, Dialysis Catheter, and yes and it is still needed Foley:  Yes, and it is still needed Code Status:  full code Last date of multidisciplinary goals of care discussion [08/26/21]  Labs   CBC: Recent Labs  Lab 08/25/21 1128 08/25/21 2111 08/26/21 0330 08/27/21 0415 08/28/21 0453  WBC 8.9 21.3* 27.7* 20.1* 16.1*  HGB 11.8* 12.3 12.5 11.5* 11.0*  HCT 37.5 39.3 41.2 36.7 35.7*  MCV 92.4 94.5 96.5 93.9 93.5  PLT 120* 128*  140* 101* 94*    Basic Metabolic Panel: Recent Labs  Lab  08/26/21 1310 08/26/21 1600 08/27/21 0415 08/27/21 1600 08/28/21 0453  NA 140 138 137 135 134*  K 6.0* 4.6 3.9 3.6 3.2*  CL 105 105 104 103 101  CO2 20* 21* 24 24 25   GLUCOSE 252* 200* 165* 170* 196*  BUN 61* 57* 41* 28* 26*  CREATININE 4.47* 3.80* 2.66* 1.69* 1.37*  CALCIUM 8.0* 7.9* 8.3* 8.6* 9.6  MG  --   --  1.7  --  1.9  PHOS  --  4.6 3.6 2.6 2.4*   GFR: Estimated Creatinine Clearance: 58.1 mL/min (A) (by C-G formula based on SCr of 1.37 mg/dL (H)). Recent Labs  Lab 08/25/21 1939 08/25/21 2111 08/26/21 0005 08/26/21 0330 08/26/21 1310 08/27/21 0415 08/27/21 0800 08/27/21 1456 08/28/21 0453  PROCALCITON 124.35  --   --  >150.00  --  79.09  --   --   --   WBC  --  21.3*  --  27.7*  --  20.1*  --   --  16.1*  LATICACIDVEN  --  2.8*   < > 3.2* 3.9*  --  2.8* 2.2*  --    < > = values in this interval not displayed.    Liver Function Tests: Recent Labs  Lab 08/25/21 1128 08/26/21 1600 08/27/21 0415 08/27/21 1600 08/28/21 0453  AST 72*  --   --   --   --   ALT 36  --   --   --   --   ALKPHOS 45  --   --   --   --   BILITOT 0.9  --   --   --   --   PROT 5.7*  --   --   --   --   ALBUMIN 2.9* 2.9* 2.7* 2.5* 2.5*   No results for input(s): LIPASE, AMYLASE in the last 168 hours. No results for input(s): AMMONIA in the last 168 hours.  ABG    Component Value Date/Time   PHART 7.26 (L) 08/26/2021 1525   PCO2ART 37 08/26/2021 1525   PO2ART 97 08/26/2021 1525   HCO3 24.1 08/27/2021 0415   ACIDBASEDEF 2.8 (H) 08/27/2021 0415   O2SAT 67.2 08/27/2021 0415     Coagulation Profile: Recent Labs  Lab 08/25/21 1128 08/26/21 0330 08/27/21 0415  INR 1.6* 1.9* 1.8*    Cardiac Enzymes: No results for input(s): CKTOTAL, CKMB, CKMBINDEX, TROPONINI in the last 168 hours.  HbA1C: Hgb A1c MFr Bld  Date/Time Value Ref Range Status  08/25/2021 09:11 PM 7.6 (H) 4.8 - 5.6 % Final    Comment:    (NOTE)         Prediabetes: 5.7 - 6.4         Diabetes: >6.4          Glycemic control for adults with diabetes: <7.0   07/12/2017 01:09 AM 6.4 (H) 4.8 - 5.6 % Final    Comment:    (NOTE) Pre diabetes:          5.7%-6.4% Diabetes:              >6.4% Glycemic control for   <7.0% adults with diabetes     CBG: Recent Labs  Lab 08/27/21 1540 08/27/21 1933 08/27/21 2339 08/28/21 0332 08/28/21 0746  GLUCAP 172* 95 202* 211* 154*    Review of Systems:   Unable to assess due to intubation and sedation   Past Medical History:  She,  has a past medical history of Bacteremia, CAD (coronary artery disease), Diabetes mellitus without complication ([place]), Diastolic CHF ([place]), Hyperlipemia, Hypertension, Lymphedema, MI (myocardial infarction) ([place]), OSA (obstructive sleep apnea), and Severe aortic stenosis.   Surgical History:   Past Surgical History:  Procedure Laterality Date   CARDIAC SURGERY     CYSTOSCOPY W/ URETERAL STENT PLACEMENT  08/25/2021   Procedure: CYSTOSCOPY WITH RETROGRADE PYELOGRAM/URETERAL STENT PLACEMENT;  Surgeon: Billey Co, MD;  Location: [facility] ORS;  Service: Urology;;   none     PACEMAKER INSERTION       Social History:   reports that she has never smoked. She has never used smokeless tobacco. She reports that she does not drink alcohol and does not use drugs.   Family History:  Her family history includes Cancer in her mother; Heart failure in her father.   Allergies Allergies  Allergen Reactions   Tetanus Toxoid, Adsorbed Swelling   Tetanus Toxoid     Other reaction(s): Unknown   Tetanus Toxoids Swelling   Lisinopril Rash    Other reaction(s): Unknown   Niacin Rash    Other reaction(s): Unknown   Sitagliptin Rash    Other reaction(s): Unknown   Sulfamethoxazole-Trimethoprim Rash    Other reaction(s): Unknown     Home Medications  Prior to Admission medications   Medication Sig Start Date End Date Taking? Authorizing Provider  acetaminophen (TYLENOL) 325 MG tablet Take 2 tablets (650 mg total) by mouth every  6 (six) hours as needed for mild pain (or Fever >/= 101). 11/05/17  Yes Wieting, Richard, MD  amLODipine (NORVASC) 5 MG tablet Take 5 mg by mouth daily. 08/09/21  Yes [provider]  apixaban (ELIQUIS) 5 MG TABS tablet Take 1 tablet by mouth 2 (two) times daily. 08/10/17  Yes [provider]  atorvastatin (LIPITOR) 40 MG tablet Take 1 tablet (40 mg total) by mouth daily at 6 PM. 05/09/16  Yes Epifanio Lesches, MD  cefpodoxime (VANTIN) 100 MG tablet Take 1 tablet (100 mg total) by mouth 2 (two) times daily. 11/29/20  Yes Billey Co, MD  Cholecalciferol 50 MCG (2000 UT) CAPS Take 1 capsule by mouth daily.   Yes [provider]  docusate sodium (COLACE) 100 MG capsule Take 1 capsule (100 mg total) by mouth daily. 11/05/17  Yes Wieting, Richard, MD  fenofibrate (TRICOR) 145 MG tablet Take 1 tablet by mouth daily. 04/17/16  Yes [provider]  furosemide (LASIX) 40 MG tablet Take 1 tablet (40 mg total) by mouth daily. 07/17/16  Yes Vaughan Basta, MD  insulin aspart (NOVOLOG FLEXPEN) 100 UNIT/ML FlexPen Inject 30 Units into the skin daily. 10/28/19  Yes [provider]  Insulin Glargine (BASAGLAR KWIKPEN) 100 UNIT/ML Inject 70 Units into the skin daily. 04/23/17  Yes [provider]  metFORMIN (GLUCOPHAGE) 1000 MG tablet Take 1 tablet (1,000 mg total) by mouth daily with breakfast. 05/22/17  Yes Max Sane, MD  metoprolol tartrate (LOPRESSOR) 25 MG tablet Take 1 tablet (25 mg total) by mouth 2 (two) times daily. 05/22/17  Yes Max Sane, MD  Potassium Chloride ER 20 MEQ TBCR Take 1 tablet by mouth daily. 02/09/20  Yes [provider]  amiodarone (PACERONE) 200 MG tablet Take 1 tablet (200 mg total) by mouth daily. Patient not taking: Reported on 08/25/2021 11/06/17   Loletha Grayer, MD  BD PEN NEEDLE NANO 2ND GEN 32G X 4 MM MISC 4 (four) times daily. use as directed 02/09/20   [provider]  tamsulosin (FLOMAX) 0.4 MG  CAPS capsule Take 1 capsule (0.4 mg total) by mouth daily. Patient not taking: Reported on 08/25/2021 02/05/20   Billey Co, MD     Critical care time: 36 minutes     Bennie Pierini, MD 08/28/21 9:24 AM

## 2021-08-28 NOTE — Consult Note (Signed)
ANTICOAGULATION CONSULT NOTE - Initial Consult ? ?Pharmacy Consult for Heparin gtt (PTA Eliquis) ?Indication: atrial fibrillation ? ?Allergies  ?Allergen Reactions  ? Tetanus Toxoid, Adsorbed Swelling  ? Tetanus Toxoid   ?  Other reaction(s): Unknown  ? Tetanus Toxoids Swelling  ? Lisinopril Rash  ?  Other reaction(s): Unknown  ? Niacin Rash  ?  Other reaction(s): Unknown  ? Sitagliptin Rash  ?  Other reaction(s): Unknown  ? Sulfamethoxazole-Trimethoprim Rash  ?  Other reaction(s): Unknown  ? ? ?Patient Measurements: ?Height: 5\' 5"  (165.1 cm) ?Weight: (!) 155.5 kg (342 lb 13 oz) ?IBW/kg (Calculated) : 57 ?Heparin Dosing Weight: 96.5kg ? ?Vital Signs: ?Temp: 97.9 ?F (36.6 ?C) (03/05 0730) ?Temp Source: Axillary (03/05 0730) ?BP: 112/71 (03/05 0915) ?Pulse Rate: 59 (03/05 0915) ? ?Labs: ?Recent Labs  ?  08/25/21 ?1128 08/25/21 ?1229 08/25/21 ?1939 08/25/21 ?2111 08/26/21 ?0005 08/26/21 ?0330 08/26/21 ?1310 08/26/21 ?1530 08/26/21 ?1600 08/27/21 ?0086 08/27/21 ?1200 08/27/21 ?1600 08/27/21 ?1800 08/28/21 ?0123 08/28/21 ?0453 08/28/21 ?0911  ?HGB 11.8*  --   --  12.3  --  12.5  --   --   --  11.5*  --   --   --   --  11.0*  --   ?HCT 37.5  --   --  39.3  --  41.2  --   --   --  36.7  --   --   --   --  35.7*  --   ?PLT 120*  --   --  128*  --  140*  --   --   --  101*  --   --   --   --  94*  --   ?APTT  --    < >  --   --   --   --   --  66*  --  117*   < >  --  64* 54*  --  61*  ?LABPROT 18.5*  --   --   --   --  22.0*  --   --   --  20.4*  --   --   --   --   --   --   ?INR 1.6*  --   --   --   --  1.9*  --   --   --  1.8*  --   --   --   --   --   --   ?HEPARINUNFRC  --   --   --   --   --   --   --  >1.10*  --  >1.10*  --   --   --  0.77*  --   --   ?CREATININE 2.31*  --  3.12*  --   --  3.99*   < >  --    < > 2.66*  --  1.69*  --   --  1.37*  --   ?TROPONINIHS  --   --  420* 403* 379*  --   --   --   --   --   --   --   --   --   --   --   ? < > = values in this interval not displayed.  ? ? ? ?Estimated Creatinine  Clearance: 58.1 mL/min (A) (by C-G formula based on SCr of 1.37 mg/dL (H)). ? ? ?Medical History: ?Past Medical History:  ?Diagnosis Date  ? Bacteremia   ? CAD (  coronary artery disease)   ? Diabetes mellitus without complication ([place])   ? Diastolic CHF ([place])   ? Hyperlipemia   ? Hypertension   ? Lymphedema   ? MI (myocardial infarction) ([place])   ? OSA (obstructive sleep apnea)   ? Severe aortic stenosis   ? ? ?Medications: No AC/APT pertinent drug allergies ?Heparin Dosing Weight: 96.5kg ?PTA: Apixaban 5mg  BID (last dose on 3/02 AM PTA >> admitted on 3/02 - 1344) ?Inpatient: Apixaban (stopped w/o receiving any doses) >> Heparin gtt. ? ?Assessment: ?71yo F w/ h/o  HFpEF, severe AS (s/p TAVR), morbid obesity (BMI 57), obesity hypoventilation syndrome, OSA, DM, CAD, & HTN who presented to [facility] ED on 08/25/2021 due to complaints of altered mental 2/2 severe urosepsis and septic shock ISO Rt ureteral stone, AKI, ac metabolic enceph. Pharmacy consulted for conversion of DOAC to Heparin gtt. ?    ? ?Baseline Labs: ?aPTT - 30s ?INR - 1.6>1.9 (likely eliquis and underlying AKI at present.) ?Hgb - 11.8>12.5 ?Plts - 120>140 ?Trop 202>542 ? ?Date Time aPTT/HL Rate/Comment ?3/3 1530 66  Therapeutic x 1 (heparin infusion off since 1330 for procedure) Okay to resume per Dr Jonnie Finner ?3/4  0415:  117/> 1.10 SUPRAtherapeutic  ?3/4 1200 73  Therapeutic x1 ?3/4 1800 64  Subtherapeutic ?3/5       0123   54                    Subtherapeutic  ?3/5 0911 61  Subtherapeutic ? ?Goal of Therapy:  ?Heparin level 0.3-0.7 units/ml ?aPTT 66-102 seconds ?Monitor platelets by anticoagulation protocol: Yes ?  ?Plan:  ?3/5 @ 0911:   aPTT = 61, subtherapeutic  ?aPTT is subtherapeutic but HL remains elevated to due Eliquis PTA . ?Will order heparin 1500 units IV X 1 bolus and increase drip rate 1900 units/hr.  ?Will recheck aPTT 6 hrs after rate change.  ?Will recheck HL on 3/6 with AM labs.  ? ? Pearla Dubonnet, PharmD ?Clinical Pharmacist ?08/28/2021 ?9:56  AM ? ? ? ? ? ?

## 2021-08-28 NOTE — Consult Note (Signed)
ANTICOAGULATION CONSULT NOTE - Initial Consult ? ?Pharmacy Consult for Heparin gtt (PTA Eliquis) ?Indication: atrial fibrillation ? ?Allergies  ?Allergen Reactions  ? Tetanus Toxoid, Adsorbed Swelling  ? Tetanus Toxoid   ?  Other reaction(s): Unknown  ? Tetanus Toxoids Swelling  ? Lisinopril Rash  ?  Other reaction(s): Unknown  ? Niacin Rash  ?  Other reaction(s): Unknown  ? Sitagliptin Rash  ?  Other reaction(s): Unknown  ? Sulfamethoxazole-Trimethoprim Rash  ?  Other reaction(s): Unknown  ? ? ?Patient Measurements: ?Height: 5\' 5"  (165.1 cm) ?Weight: (!) 155.5 kg (342 lb 13 oz) ?IBW/kg (Calculated) : 57 ?Heparin Dosing Weight: 96.5kg ? ?Vital Signs: ?Temp: 97 ?F (36.1 ?C) (03/05 1756) ?Temp Source: Axillary (03/05 1630) ?BP: 93/53 (03/05 1800) ?Pulse Rate: 59 (03/05 1800) ? ?Labs: ?Recent Labs  ?  08/25/21 ?1939 08/25/21 ?2111 08/25/21 ?2111 08/26/21 ?0005 08/26/21 ?0330 08/26/21 ?1310 08/26/21 ?1530 08/26/21 ?1600 08/27/21 ?0865 08/27/21 ?1200 08/27/21 ?1600 08/27/21 ?1800 08/28/21 ?0123 08/28/21 ?0453 08/28/21 ?0911 08/28/21 ?1601  ?HGB  --  12.3   < >  --  12.5  --   --   --  11.5*  --   --   --   --  11.0*  --   --   ?HCT  --  39.3   < >  --  41.2  --   --   --  36.7  --   --   --   --  35.7*  --   --   ?PLT  --  128*   < >  --  140*  --   --   --  101*  --   --   --   --  94*  --   --   ?APTT  --   --   --   --   --    < > 66*  --  117*   < >  --    < > 54*  --  61* 77*  ?LABPROT  --   --   --   --  22.0*  --   --   --  20.4*  --   --   --   --   --   --   --   ?INR  --   --   --   --  1.9*  --   --   --  1.8*  --   --   --   --   --   --   --   ?HEPARINUNFRC  --   --   --   --   --   --  >1.10*  --  >1.10*  --   --   --  0.77*  --   --   --   ?CREATININE 3.12*  --   --   --  3.99*   < >  --    < > 2.66*  --  1.69*  --   --  1.37*  --  1.20*  ?TROPONINIHS 420* 403*  --  379*  --   --   --   --   --   --   --   --   --   --   --   --   ? < > = values in this interval not displayed.  ? ? ? ?Estimated Creatinine  Clearance: 66.4 mL/min (A) (by C-G formula based on SCr of 1.2 mg/dL (H)). ? ? ?Medical  History: ?Past Medical History:  ?Diagnosis Date  ? Bacteremia   ? CAD (coronary artery disease)   ? Diabetes mellitus without complication ([place])   ? Diastolic CHF ([place])   ? Hyperlipemia   ? Hypertension   ? Lymphedema   ? MI (myocardial infarction) ([place])   ? OSA (obstructive sleep apnea)   ? Severe aortic stenosis   ? ? ?Medications: No AC/APT pertinent drug allergies ?Heparin Dosing Weight: 96.5kg ?PTA: Apixaban 5mg  BID (last dose on 3/02 AM PTA >> admitted on 3/02 - 1344) ?Inpatient: Apixaban (stopped w/o receiving any doses) >> Heparin gtt. ? ?Assessment: ?71yo F w/ h/o  HFpEF, severe AS (s/p TAVR), morbid obesity (BMI 57), obesity hypoventilation syndrome, OSA, DM, CAD, & HTN who presented to [facility] ED on 08/25/2021 due to complaints of altered mental 2/2 severe urosepsis and septic shock ISO Rt ureteral stone, AKI, ac metabolic enceph. Pharmacy consulted for conversion of DOAC to Heparin gtt. ?    ? ?Baseline Labs: ?aPTT - 30s ?INR - 1.6>1.9 (likely eliquis and underlying AKI at present.) ?Hgb - 11.8>12.5 ?Plts - 120>140 ?Trop 353>299 ? ?Date Time aPTT/HL Rate/Comment ?3/3 1530 66  Therapeutic x 1 (heparin infusion off since 1330 for procedure) Okay to resume per Dr Jonnie Finner ?3/4  0415:  117/> 1.10 SUPRAtherapeutic  ?3/4 1200 73  Therapeutic x1 ?3/4 1800 64  Subtherapeutic ?3/5       0123   54                    Subtherapeutic  ?3/5 0911 61  Subtherapeutic ?3/5 1601 77  Therapeutic x1 ? ?Goal of Therapy:  ?Heparin level 0.3-0.7 units/ml ?aPTT 66-102 seconds ?Monitor platelets by anticoagulation protocol: Yes ?  ?Plan:  ?aPTT is therapetic. Continue heparin drip rate @ 1900 units/hr.  ?Will recheck aPTT 6 hrs  ?Will recheck HL on 3/6 with AM labs.  ? ? ?Darrick Penna, PharmD, MS PGPM ?Clinical Pharmacist ?08/28/2021 ?6:42 PM ? ? ? ? ? ?

## 2021-08-28 NOTE — Plan of Care (Addendum)
Patient remains intubated and on CRRT. Remains on fentanyl infusion as patient did not tolerate precedex due to hypotension. Bilateral wrists restraints in place per order. Now weaned off of vasopressors. ? ? ?Problem: Education: ?Goal: Knowledge of General Education information will improve ?Description: Including pain rating scale, medication(s)/side effects and non-pharmacologic comfort measures ?Outcome: Progressing ?  ?Problem: Health Behavior/Discharge Planning: ?Goal: Ability to manage health-related needs will improve ?Outcome: Progressing ?  ?Problem: Clinical Measurements: ?Goal: Ability to maintain clinical measurements within normal limits will improve ?Outcome: Progressing ?Goal: Will remain free from infection ?Outcome: Progressing ?Goal: Diagnostic test results will improve ?Outcome: Progressing ?Goal: Respiratory complications will improve ?Outcome: Progressing ?Goal: Cardiovascular complication will be avoided ?Outcome: Progressing ?  ?Problem: Activity: ?Goal: Risk for activity intolerance will decrease ?Outcome: Progressing ?  ?Problem: Nutrition: ?Goal: Adequate nutrition will be maintained ?Outcome: Progressing ?  ?Problem: Coping: ?Goal: Level of anxiety will decrease ?Outcome: Progressing ?  ?Problem: Elimination: ?Goal: Will not experience complications related to bowel motility ?Outcome: Progressing ?Goal: Will not experience complications related to urinary retention ?Outcome: Progressing ?  ?Problem: Pain Managment: ?Goal: General experience of comfort will improve ?Outcome: Progressing ?  ?Problem: Safety: ?Goal: Ability to remain free from injury will improve ?Outcome: Progressing ?  ?Problem: Skin Integrity: ?Goal: Risk for impaired skin integrity will decrease ?Outcome: Progressing ?  ?Problem: Safety: ?Goal: Non-violent Restraint(s) ?Outcome: Progressing ?  ? ? ?

## 2021-08-28 NOTE — Consult Note (Signed)
ANTICOAGULATION CONSULT NOTE - Initial Consult ? ?Pharmacy Consult for Heparin gtt (PTA Eliquis) ?Indication: atrial fibrillation ? ?Allergies  ?Allergen Reactions  ? Tetanus Toxoid, Adsorbed Swelling  ? Tetanus Toxoid   ?  Other reaction(s): Unknown  ? Tetanus Toxoids Swelling  ? Lisinopril Rash  ?  Other reaction(s): Unknown  ? Niacin Rash  ?  Other reaction(s): Unknown  ? Sitagliptin Rash  ?  Other reaction(s): Unknown  ? Sulfamethoxazole-Trimethoprim Rash  ?  Other reaction(s): Unknown  ? ? ?Patient Measurements: ?Height: 5\' 5"  (165.1 cm) ?Weight: (!) 156.9 kg (345 lb 14.4 oz) ?IBW/kg (Calculated) : 57 ?Heparin Dosing Weight: 96.5kg ? ?Vital Signs: ?Temp: 97.2 ?F (36.2 ?C) (03/05 0100) ?Temp Source: Axillary (03/05 0000) ?BP: 99/77 (03/05 0200) ?Pulse Rate: 58 (03/05 0200) ? ?Labs: ?Recent Labs  ?  08/25/21 ?1128 08/25/21 ?1229 08/25/21 ?1939 08/25/21 ?2111 08/26/21 ?0005 08/26/21 ?0330 08/26/21 ?1310 08/26/21 ?1530 08/26/21 ?1600 08/27/21 ?8119 08/27/21 ?1200 08/27/21 ?1600 08/27/21 ?1800 08/28/21 ?0123  ?HGB 11.8*  --   --  12.3  --  12.5  --   --   --  11.5*  --   --   --   --   ?HCT 37.5  --   --  39.3  --  41.2  --   --   --  36.7  --   --   --   --   ?PLT 120*  --   --  128*  --  140*  --   --   --  101*  --   --   --   --   ?APTT  --    < >  --   --   --   --   --  66*  --  117* 73*  --  64* 54*  ?LABPROT 18.5*  --   --   --   --  22.0*  --   --   --  20.4*  --   --   --   --   ?INR 1.6*  --   --   --   --  1.9*  --   --   --  1.8*  --   --   --   --   ?HEPARINUNFRC  --   --   --   --   --   --   --  >1.10*  --  >1.10*  --   --   --  0.77*  ?CREATININE 2.31*  --  3.12*  --   --  3.99*   < >  --  3.80* 2.66*  --  1.69*  --   --   ?TROPONINIHS  --   --  420* 403* 379*  --   --   --   --   --   --   --   --   --   ? < > = values in this interval not displayed.  ? ? ? ?Estimated Creatinine Clearance: 47.4 mL/min (A) (by C-G formula based on SCr of 1.69 mg/dL (H)). ? ? ?Medical History: ?Past Medical History:   ?Diagnosis Date  ? Bacteremia   ? CAD (coronary artery disease)   ? Diabetes mellitus without complication ([place])   ? Diastolic CHF ([place])   ? Hyperlipemia   ? Hypertension   ? Lymphedema   ? MI (myocardial infarction) ([place])   ? OSA (obstructive sleep apnea)   ? Severe aortic stenosis   ? ? ?Medications:  No AC/APT pertinent drug allergies ?Heparin Dosing Weight: 96.5kg ?PTA: Apixaban 5mg  BID (last dose on 3/02 AM PTA >> admitted on 3/02 - 1344) ?Inpatient: Apixaban (stopped w/o receiving any doses) >> Heparin gtt. ? ?Assessment: ?71yo F w/ h/o  HFpEF, severe AS (s/p TAVR), morbid obesity (BMI 57), obesity hypoventilation syndrome, OSA, DM, CAD, & HTN who presented to [facility] ED on 08/25/2021 due to complaints of altered mental 2/2 severe urosepsis and septic shock ISO Rt ureteral stone, AKI, ac metabolic enceph. Pharmacy consulted for conversion of DOAC to Heparin gtt. ?    ? ?Baseline Labs: ?aPTT - 30s ?INR - 1.6>1.9 (likely eliquis and underlying AKI at present.) ?Hgb - 11.8>12.5 ?Plts - 120>140 ?Trop 725>366 ? ?Date Time aPTT/HL Rate/Comment ?3/3 1530 66  Therapeutic x 1 (heparin infusion off since 1330 for procedure) Okay to resume per Dr Jonnie Finner ?3/4  0415:  117/> 1.10 SUPRAtherapeutic  ?3/4 1200 73  Therapeutic x1 ?3/4 1800 64  Subtherapeutic ?3/5       0123   54                    Subtherapeutic  ? ?Goal of Therapy:  ?Heparin level 0.3-0.7 units/ml ?aPTT 66-102 seconds ?Monitor platelets by anticoagulation protocol: Yes ?  ?Plan:  ?3/5 @ 0123:   aPTT = 54,  HL = 0.77 ?aPTT is subtherapeutic but HL remains elevated to due Eliquis PTA . ?Will order heparin 3000 units IV X 1 bolus and increase drip rate 1700 units/hr.  ?Will recheck aPTT 6 hrs after rate change.  ?Will recheck HL on 3/6 with AM labs.  ? ? Shanyce Daris D, PharmD ?Clinical Pharmacist ?08/28/2021 ?2:30 AM ? ? ? ? ? ?

## 2021-08-28 NOTE — Progress Notes (Signed)
Increased oral and ETT secretions this evening. Pt intermittently -2 RASS to +2 RASS. Mitts on pt and PRN versed administered for agitation. Still unable to follow commands. Pupils equal, round, brisk. Purposeful movement in both upper extremities and non- purposeful movement to both lower extremities. Only 20 cc dark amber to tea colored urine output this shift.    ?

## 2021-08-29 LAB — BLOOD GAS, ARTERIAL
Acid-Base Excess: 2.2 mmol/L — ABNORMAL HIGH (ref 0.0–2.0)
Acid-base deficit: 0.2 mmol/L (ref 0.0–2.0)
Bicarbonate: 24 mmol/L (ref 20.0–28.0)
Bicarbonate: 25.3 mmol/L (ref 20.0–28.0)
Delivery systems: POSITIVE
Expiratory PAP: 6 cmH2O
Expiratory PAP: 6 cmH2O
FIO2: 0.3 %
FIO2: 30 %
Inspiratory PAP: 16 cmH2O
Inspiratory PAP: 16 cmH2O
O2 Saturation: 97.7 %
O2 Saturation: 97.9 %
Patient temperature: 37
Patient temperature: 37
RATE: 12 resp/min
RATE: 12 resp/min
pCO2 arterial: 37 mmHg (ref 32–48)
pCO2 arterial: 34 mmHg (ref 32–48)
pH, Arterial: 7.42 (ref 7.35–7.45)
pH, Arterial: 7.48 — ABNORMAL HIGH (ref 7.35–7.45)
pO2, Arterial: 87 mmHg (ref 83–108)
pO2, Arterial: 81 mmHg — ABNORMAL LOW (ref 83–108)

## 2021-08-29 LAB — RENAL FUNCTION PANEL
Albumin: 2.5 g/dL — ABNORMAL LOW (ref 3.5–5.0)
Albumin: 2.6 g/dL — ABNORMAL LOW (ref 3.5–5.0)
Anion gap: 10 (ref 5–15)
Anion gap: 8 (ref 5–15)
BUN: 26 mg/dL — ABNORMAL HIGH (ref 8–23)
BUN: 29 mg/dL — ABNORMAL HIGH (ref 8–23)
CO2: 25 mmol/L (ref 22–32)
CO2: 25 mmol/L (ref 22–32)
Calcium: 8.3 mg/dL — ABNORMAL LOW (ref 8.9–10.3)
Calcium: 8.2 mg/dL — ABNORMAL LOW (ref 8.9–10.3)
Chloride: 101 mmol/L (ref 98–111)
Chloride: 103 mmol/L (ref 98–111)
Creatinine, Ser: 1.17 mg/dL — ABNORMAL HIGH (ref 0.44–1.00)
Creatinine, Ser: 1.21 mg/dL — ABNORMAL HIGH (ref 0.44–1.00)
GFR, Estimated: 50 mL/min — ABNORMAL LOW (ref >60)
GFR, Estimated: 48 mL/min — ABNORMAL LOW (ref >60)
Glucose, Bld: 224 mg/dL — ABNORMAL HIGH (ref 70–99)
Glucose, Bld: 203 mg/dL — ABNORMAL HIGH (ref 70–99)
Phosphorus: 1.7 mg/dL — ABNORMAL LOW (ref 2.5–4.6)
Phosphorus: 2.1 mg/dL — ABNORMAL LOW (ref 2.5–4.6)
Potassium: 3.9 mmol/L (ref 3.5–5.1)
Potassium: 3.9 mmol/L (ref 3.5–5.1)
Sodium: 136 mmol/L (ref 135–145)
Sodium: 136 mmol/L (ref 135–145)

## 2021-08-29 LAB — GLUCOSE, CAPILLARY
Glucose-Capillary: 194 mg/dL — ABNORMAL HIGH (ref 70–99)
Glucose-Capillary: 207 mg/dL — ABNORMAL HIGH (ref 70–99)
Glucose-Capillary: 192 mg/dL — ABNORMAL HIGH (ref 70–99)
Glucose-Capillary: 183 mg/dL — ABNORMAL HIGH (ref 70–99)
Glucose-Capillary: 199 mg/dL — ABNORMAL HIGH (ref 70–99)

## 2021-08-29 LAB — CBC
HCT: 35.1 % — ABNORMAL LOW (ref 36.0–46.0)
Hemoglobin: 11.1 g/dL — ABNORMAL LOW (ref 12.0–15.0)
MCH: 28.8 pg (ref 26.0–34.0)
MCHC: 31.6 g/dL (ref 30.0–36.0)
MCV: 91.2 fL (ref 80.0–100.0)
Platelets: 75 10*3/uL — ABNORMAL LOW (ref 150–400)
RBC: 3.85 MIL/uL — ABNORMAL LOW (ref 3.87–5.11)
RDW: 14.9 % (ref 11.5–15.5)
WBC: 10.6 10*3/uL — ABNORMAL HIGH (ref 4.0–10.5)
nRBC: 1.1 % — ABNORMAL HIGH (ref 0.0–0.2)

## 2021-08-29 LAB — CULTURE, RESPIRATORY W GRAM STAIN: Culture: NORMAL

## 2021-08-29 LAB — MAGNESIUM: Magnesium: 2.4 mg/dL (ref 1.7–2.4)

## 2021-08-29 LAB — APTT: aPTT: 71 seconds — ABNORMAL HIGH (ref 24–36)

## 2021-08-29 LAB — PHOSPHORUS: Phosphorus: 2.5 mg/dL (ref 2.5–4.6)

## 2021-08-29 LAB — HEPARIN LEVEL (UNFRACTIONATED): Heparin Unfractionated: 0.71 IU/mL — ABNORMAL HIGH (ref 0.30–0.70)

## 2021-08-29 MED ORDER — HEPARIN SODIUM (PORCINE) 1000 UNIT/ML IJ SOLN
1000.0000 [IU] | INTRAMUSCULAR | Status: DC | PRN
  Administered 2021-08-31: 2800 [IU]
  Filled 2021-08-29: qty 6
  Filled 2021-08-29: qty 3

## 2021-08-29 MED ORDER — POTASSIUM & SODIUM PHOSPHATES 280-160-250 MG PO PACK
500.0000 mg | PACK | Freq: Once | ORAL | Status: AC
  Administered 2021-08-29: 500 mg
  Filled 2021-08-29: qty 2

## 2021-08-29 NOTE — Progress Notes (Signed)
[facility], [place] 08/29/21  Subjective:   Hospital day # 4   cvs: paced rhythm, off pressors pulm:vent assisted; fio2 - 28% gi:  Renal: 03/05 0701 - 03/06 0700 In: 1494.7 [I.V.:843; NG/GT:541.7; IV Piggyback:100] Out: 1495 [Urine:60] Lab Results  Component Value Date   CREATININE 1.17 (H) 08/29/2021   CREATININE 1.20 (H) 08/28/2021   CREATININE 1.37 (H) 08/28/2021   Continued on CRRT   Objective:  Vital signs in last 24 hours:  Temp:  [96.4 F (35.8 C)-98.2 F (36.8 C)] 96.8 F (36 C) (03/06 0600) Pulse Rate:  [43-69] 60 (03/06 0700) Resp:  [14-28] 22 (03/06 0700) BP: (74-142)/(49-88) 109/50 (03/06 0700) SpO2:  [95 %-100 %] 100 % (03/06 0737) FiO2 (%):  [28 %] 28 % (03/06 0737) Weight:  [152.8 kg] 152.8 kg (03/06 0400)  Weight change: -2.7 kg Filed Weights   08/27/21 0428 08/28/21 0456 08/29/21 0400  Weight: (!) 156.9 kg (!) 155.5 kg (!) 152.8 kg    Intake/Output:    Intake/Output Summary (Last 24 hours) at 08/29/2021 0814 Last data filed at 08/29/2021 0800 Gross per 24 hour  Intake 1436.19 ml  Output 1465 ml  Net -28.81 ml     Physical Exam: General: Obese lady, critically ill, laying in the bed  HEENT ET tube in place  Pulm/lungs Ventilator assisted, coarse breath sounds  CVS/Heart Irregular, paced rhythm  Abdomen:  Soft  Extremities: Some dependent edema  Neurologic: Sedated  Skin: No acute rashes  Access: Left IJ temporary dialysis catheter   Foley in place with small amount of dark urine    Basic Metabolic Panel:  Recent Labs  Lab 08/27/21 0415 08/27/21 1600 08/28/21 0453 08/28/21 1601 08/29/21 0341  NA 137 135 134* 137 136  K 3.9 3.6 3.2* 3.6 3.9  CL 104 103 101 103 101  CO2 24 24 25 25 25   GLUCOSE 165* 170* 196* 177* 224*  BUN 41* 28* 26* 26* 26*  CREATININE 2.66* 1.69* 1.37* 1.20* 1.17*  CALCIUM 8.3* 8.6* 9.6 8.4* 8.3*  MG 1.7  --  1.9  --  2.4  PHOS 3.6 2.6 2.4* 1.7* 1.7*     CBC: Recent Labs   Lab 08/25/21 2111 08/26/21 0330 08/27/21 0415 08/28/21 0453 08/29/21 0341  WBC 21.3* 27.7* 20.1* 16.1* 10.6*  HGB 12.3 12.5 11.5* 11.0* 11.1*  HCT 39.3 41.2 36.7 35.7* 35.1*  MCV 94.5 96.5 93.9 93.5 91.2  PLT 128* 140* 101* 94* 75*     No results found for: HEPBSAG, HEPBSAB, HEPBIGM    Microbiology:  Recent Results (from the past 240 hour(s))  Blood Culture (routine x 2)     Status: Abnormal   Collection Time: 08/25/21 12:29 PM   Specimen: BLOOD RIGHT HAND  Result Value Ref Range Status   Specimen Description   Final    BLOOD RIGHT HAND Performed at [facility], [address] [place], [place] [address]    Special Requests   Final    BOTTLES DRAWN AEROBIC AND ANAEROBIC Blood Culture results may not be optimal due to an inadequate volume of blood received in culture bottles Performed at [facility], [address] [place], [place] [address]    Culture  Setup Time   Final    GRAM NEGATIVE RODS IN BOTH AEROBIC AND ANAEROBIC BOTTLES CRITICAL RESULT CALLED TO, READ BACK BY AND VERIFIED WITH: MORGAN HICKS AT 2207 ON 08/25/21 BY SS Performed at [facility], [address] [place],  [place] [address]    Culture ESCHERICHIA COLI (A)  Final   Report Status 08/28/2021 FINAL  Final   Organism ID, Bacteria ESCHERICHIA COLI  Final      Susceptibility   Escherichia coli - MIC*    AMPICILLIN 8 SENSITIVE Sensitive     CEFAZOLIN <=4 SENSITIVE Sensitive     CEFEPIME <=0.12 SENSITIVE Sensitive     CEFTAZIDIME <=1 SENSITIVE Sensitive     CEFTRIAXONE <=0.25 SENSITIVE Sensitive     CIPROFLOXACIN <=0.25 SENSITIVE Sensitive     GENTAMICIN <=1 SENSITIVE Sensitive     IMIPENEM <=0.25 SENSITIVE Sensitive     TRIMETH/SULFA <=20 SENSITIVE Sensitive     AMPICILLIN/SULBACTAM <=2 SENSITIVE Sensitive     PIP/TAZO <=4 SENSITIVE Sensitive     * ESCHERICHIA COLI  Blood Culture ID Panel (Reflexed)     Status: Abnormal   Collection Time: 08/25/21 12:29 PM  Result  Value Ref Range Status   Enterococcus faecalis NOT DETECTED NOT DETECTED Final   Enterococcus Faecium NOT DETECTED NOT DETECTED Final   Listeria monocytogenes NOT DETECTED NOT DETECTED Final   Staphylococcus species NOT DETECTED NOT DETECTED Final   Staphylococcus aureus (BCID) NOT DETECTED NOT DETECTED Final   Staphylococcus epidermidis NOT DETECTED NOT DETECTED Final   Staphylococcus lugdunensis NOT DETECTED NOT DETECTED Final   Streptococcus species NOT DETECTED NOT DETECTED Final   Streptococcus agalactiae NOT DETECTED NOT DETECTED Final   Streptococcus pneumoniae NOT DETECTED NOT DETECTED Final   Streptococcus pyogenes NOT DETECTED NOT DETECTED Final   A.calcoaceticus-baumannii NOT DETECTED NOT DETECTED Final   Bacteroides fragilis NOT DETECTED NOT DETECTED Final   Enterobacterales DETECTED (A) NOT DETECTED Final    Comment: Enterobacterales represent a large order of gram negative bacteria, not a single organism. CRITICAL RESULT CALLED TO, READ BACK BY AND VERIFIED WITH: MORGAN HICKS AT 2207 ON 08/25/21 BY SS    Enterobacter cloacae complex NOT DETECTED NOT DETECTED Final   Escherichia coli DETECTED (A) NOT DETECTED Final    Comment: CRITICAL RESULT CALLED TO, READ BACK BY AND VERIFIED WITH: MORGAN HICKS AT 2207 ON 08/25/21 BY SS    Klebsiella aerogenes NOT DETECTED NOT DETECTED Final   Klebsiella oxytoca NOT DETECTED NOT DETECTED Final   Klebsiella pneumoniae NOT DETECTED NOT DETECTED Final   Proteus species NOT DETECTED NOT DETECTED Final   Salmonella species NOT DETECTED NOT DETECTED Final   Serratia marcescens NOT DETECTED NOT DETECTED Final   Haemophilus influenzae NOT DETECTED NOT DETECTED Final   Neisseria meningitidis NOT DETECTED NOT DETECTED Final   Pseudomonas aeruginosa NOT DETECTED NOT DETECTED Final   Stenotrophomonas maltophilia NOT DETECTED NOT DETECTED Final   Candida albicans NOT DETECTED NOT DETECTED Final   Candida auris NOT DETECTED NOT DETECTED Final    Candida glabrata NOT DETECTED NOT DETECTED Final   Candida krusei NOT DETECTED NOT DETECTED Final   Candida parapsilosis NOT DETECTED NOT DETECTED Final   Candida tropicalis NOT DETECTED NOT DETECTED Final   Cryptococcus neoformans/gattii NOT DETECTED NOT DETECTED Final   CTX-M ESBL NOT DETECTED NOT DETECTED Final   Carbapenem resistance IMP NOT DETECTED NOT DETECTED Final   Carbapenem resistance KPC NOT DETECTED NOT DETECTED Final   Carbapenem resistance NDM NOT DETECTED NOT DETECTED Final   Carbapenem resist OXA 48 LIKE NOT DETECTED NOT DETECTED Final   Carbapenem resistance VIM NOT DETECTED NOT DETECTED Final    Comment: Performed at [facility], [address] [place], [place] [address]  Urine Culture  Status: Abnormal   Collection Time: 08/25/21  1:47 PM   Specimen: In/Out Cath Urine  Result Value Ref Range Status   Specimen Description   Final    IN/OUT CATH URINE Performed at [facility], [address] [place], [place] [address]    Special Requests   Final    NONE Performed at [facility], [address] [place], [place] [address]    Culture >=100,000 COLONIES/mL ESCHERICHIA COLI (A)  Final   Report Status 08/28/2021 FINAL  Final   Organism ID, Bacteria ESCHERICHIA COLI (A)  Final      Susceptibility   Escherichia coli - MIC*    AMPICILLIN 8 SENSITIVE Sensitive     CEFAZOLIN <=4 SENSITIVE Sensitive     CEFEPIME <=0.12 SENSITIVE Sensitive     CEFTRIAXONE <=0.25 SENSITIVE Sensitive     CIPROFLOXACIN <=0.25 SENSITIVE Sensitive     GENTAMICIN <=1 SENSITIVE Sensitive     IMIPENEM <=0.25 SENSITIVE Sensitive     NITROFURANTOIN <=16 SENSITIVE Sensitive     TRIMETH/SULFA <=20 SENSITIVE Sensitive     AMPICILLIN/SULBACTAM <=2 SENSITIVE Sensitive     PIP/TAZO <=4 SENSITIVE Sensitive     * >=100,000 COLONIES/mL ESCHERICHIA COLI  Resp Panel by RT-PCR (Flu A&B, Covid) Nasopharyngeal Swab     Status: None   Collection Time: 08/25/21  3:05 PM    Specimen: Nasopharyngeal Swab; Nasopharyngeal(NP) swabs in vial transport medium  Result Value Ref Range Status   SARS Coronavirus 2 by RT PCR NEGATIVE NEGATIVE Final    Comment: (NOTE) SARS-CoV-2 target nucleic acids are NOT DETECTED.  The SARS-CoV-2 RNA is generally detectable in upper respiratory specimens during the acute phase of infection. The lowest concentration of SARS-CoV-2 viral copies this assay can detect is 138 copies/mL. A negative result does not preclude SARS-Cov-2 infection and should not be used as the sole basis for treatment or other patient management decisions. A negative result may occur with  improper specimen collection/handling, submission of specimen other than nasopharyngeal swab, presence of viral mutation(s) within the areas targeted by this assay, and inadequate number of viral copies(<138 copies/mL). A negative result must be combined with clinical observations, patient history, and epidemiological information. The expected result is Negative.  Fact Sheet for Patients:  EntrepreneurPulse.com.au  Fact Sheet for Healthcare Providers:  IncredibleEmployment.be  This test is no t yet approved or cleared by the [place] FDA and  has been authorized for detection and/or diagnosis of SARS-CoV-2 by FDA under an Emergency Use Authorization (EUA). This EUA will remain  in effect (meaning this test can be used) for the duration of the COVID-19 declaration under Section 564(b)(1) of the Act, 21 U.S.C.section 360bbb-3(b)(1), unless the authorization is terminated  or revoked sooner.       Influenza A by PCR NEGATIVE NEGATIVE Final   Influenza B by PCR NEGATIVE NEGATIVE Final    Comment: (NOTE) The Xpert Xpress SARS-CoV-2/FLU/RSV plus assay is intended as an aid in the diagnosis of influenza from Nasopharyngeal swab specimens and should not be used as a sole basis for treatment. Nasal washings and aspirates are  unacceptable for Xpert Xpress SARS-CoV-2/FLU/RSV testing.  Fact Sheet for Patients: EntrepreneurPulse.com.au  Fact Sheet for Healthcare Providers: IncredibleEmployment.be  This test is not yet approved or cleared by the [place] FDA and has been authorized for detection and/or diagnosis of SARS-CoV-2 by FDA under an Emergency Use Authorization (EUA). This EUA will remain in effect (meaning this test can be used) for the duration of  the COVID-19 declaration under Section 564(b)(1) of the Act, 21 U.S.C. section 360bbb-3(b)(1), unless the authorization is terminated or revoked.  Performed at [facility], [address] [place], [place] [address]   MRSA Next Gen by PCR, Nasal     Status: None   Collection Time: 08/25/21  6:44 PM   Specimen: Nasal Mucosa; Nasal Swab  Result Value Ref Range Status   MRSA by PCR Next Gen NOT DETECTED NOT DETECTED Final    Comment: (NOTE) The GeneXpert MRSA Assay (FDA approved for NASAL specimens only), is one component of a comprehensive MRSA colonization surveillance program. It is not intended to diagnose MRSA infection nor to guide or monitor treatment for MRSA infections. Test performance is not FDA approved in patients less than 52 years old. Performed at [facility], [address] [place], [place] [address]   Blood Culture (routine x 2)     Status: None (Preliminary result)   Collection Time: 08/25/21  7:38 PM   Specimen: BLOOD  Result Value Ref Range Status   Specimen Description BLOOD RIGHT HAND  Final   Special Requests   Final    BOTTLES DRAWN AEROBIC AND ANAEROBIC Blood Culture adequate volume   Culture   Final    NO GROWTH 4 DAYS Performed at [facility], [address], [place], [place] [address]    Report Status PENDING  Incomplete  Culture, Respiratory w Gram Stain     Status: None (Preliminary result)   Collection Time: 08/26/21  8:21 AM   Specimen:  Tracheal Aspirate; Respiratory  Result Value Ref Range Status   Specimen Description   Final    TRACHEAL ASPIRATE Performed at [facility], [address] [place], [place] [address]    Special Requests   Final    NONE Performed at [facility], [address] [place], [place] [address]    Gram Stain   Final    RARE SQUAMOUS EPITHELIAL CELLS PRESENT RARE WBC PRESENT, PREDOMINANTLY MONONUCLEAR NO ORGANISMS SEEN    Culture   Final    RARE Normal respiratory flora-no Staph aureus or Pseudomonas seen Performed at [facility], [address] [place], [place] [address]    Report Status PENDING  Incomplete    Coagulation Studies: Recent Labs    08/27/21 0415  LABPROT 20.4*  INR 1.8*    Urinalysis: No results for input(s): COLORURINE, LABSPEC, PHURINE, GLUCOSEU, HGBUR, BILIRUBINUR, KETONESUR, PROTEINUR, UROBILINOGEN, NITRITE, LEUKOCYTESUR in the last 72 hours.  Invalid input(s): APPERANCEUR    Imaging: No results found.   Medications:     prismasol BGK 4/2.5 400 mL/hr at 08/29/21 0640    prismasol BGK 4/2.5 400 mL/hr at 08/29/21 9678   sodium chloride Stopped (08/29/21 0231)   cefTRIAXone (ROCEPHIN)  IV Stopped (08/28/21 2151)   dexmedetomidine (PRECEDEX) IV infusion Stopped (08/28/21 2246)   fentaNYL infusion INTRAVENOUS 100 mcg/hr (08/29/21 0700)   heparin 1,900 Units/hr (08/29/21 0700)   norepinephrine (LEVOPHED) Adult infusion Stopped (08/28/21 1209)   prismasol BGK 4/2.5 2,000 mL/hr at 08/29/21 0641    chlorhexidine gluconate (MEDLINE KIT)  15 mL Mouth Rinse BID   Chlorhexidine Gluconate Cloth  6 each Topical Q0600   docusate  100 mg Per Tube BID   feeding supplement (PROSource TF)  45 mL Per Tube BID   feeding supplement (VITAL HIGH PROTEIN)  1,000 mL Per Tube Q24H   free water  20 mL Per Tube Q4H   insulin aspart  0-20  Units Subcutaneous Q4H   insulin aspart  4 Units Subcutaneous Q4H   insulin glargine-yfgn  15 Units  Subcutaneous Daily   mouth rinse  15 mL Mouth Rinse 10 times per day   pantoprazole (PROTONIX) IV  40 mg Intravenous Daily   polyethylene glycol  17 g Per Tube Daily   sodium chloride flush  10-40 mL Intracatheter Q12H   sodium chloride, acetaminophen, fentaNYL, heparin, ipratropium-albuterol, midazolam, ondansetron **OR** ondansetron (ZOFRAN) IV, sodium chloride, sodium chloride flush  Assessment/ Plan:  71 y.o. female with  morbid obesity (BMI 57), DM, CAD, OSA, HTN   admitted on 07/04/3435 for Renal colic [D57] Sepsis ([place]) [A41.9] Urinary tract infection with hematuria, site unspecified [N39.0, R31.9] Community acquired pneumonia, unspecified laterality [J18.9] Sepsis, due to unspecified organism, unspecified whether acute organ dysfunction present ([place]) [A41.9]   # AKI on St 3b AKI likely secondary to sepsis Baseline creatinine of 1.3 from 2019.  Admit creatinine of 2.31 that peaked at 4.47  03/05 0701 - 03/06 0700 In: 1494.7 [I.V.:843; NG/GT:541.7; IV Piggyback:100] Out: 1495 [Urine:60]  CRRT output of 1500 cc 4K/2.5 calcium bath Blood flow 200 cc/min Prefilter 400 cc/hr, dialyzer 2000 cc/hr, post filter 400 cc/hr Patient is off of pressors.  Can discontinue CRRT after this nursing shift. We will reevaluate for intermittent hemodialysis tomorrow based on her labs and metabolic parameters.   # Sepsis with UTI, hypotension, shock # Nephrolithiasis with rt proximal stone with hydronephrosis and left UPJ stone without hydronephrosis. On 3/2, patient underwent cystoscopy with bilateral ureteral stents placed by Dr. Diamantina Providence. Patient was found to have purulent urine in bladder. Plan for outpatient b.l uretereroscopy/laser lithotripsy in 1-2 months   LOS: [place] 3/6/20238:14 [place], [place]  Note: This note was prepared with Dragon dictation. Any transcription errors are unintentional

## 2021-08-29 NOTE — Progress Notes (Addendum)
0800 Patient responsive and follows limited commands. Opens eyes when ask. CRRT running without issues. 1000 Patient does not like mouth care. Fentanyl  decreased to 50 mcg/hour. Patient changed to spontaneous per RT. 1125 Extubated to BiPAP at 40% 20/6 with a backup rate of 12. People did not try to talk. Fentanyl discontinued. 1200 Tolerating BiPAP without issues.  CRRT still running at previous settings. 1400 BiPAP removed for mouth care. Patient crying because her mouth is dry. Rigorous mouth care given. Patient cried more. Ask if she was in pain. Patient replied yes but would not elaborate where. NP D.Nelson consulted for pain management orders. 1600 Patient ask why she was moaning. She stated she did not know. Ask if she was in pain-answered NO!Marland Kitchen [place] came to visit. NT stated she looked soooo much better than when she was admitted. Bladder scanned for 37mls of urine in bladder. 1700 CRRT stopped per verbal order-Dr.Singh when filter was done. Unable to return blood per CRRT machine-chance of returning clots to patient. Patient resistant to turning and ROM all day.Urine output 50 ml's of brown urine with sediment noted. 1800 Patient ask again why she was moaning. Again patient did not know why. No pain medication given due to excessive sleepiness. NP Meda Coffee aware. Patient remains in AV paced rhythm with oxygen saturations at 99%. ?

## 2021-08-29 NOTE — Consult Note (Signed)
ANTICOAGULATION CONSULT NOTE - Initial Consult ? ?Pharmacy Consult for Heparin gtt (PTA Eliquis) ?Indication: atrial fibrillation ? ?Allergies  ?Allergen Reactions  ? Tetanus Toxoid, Adsorbed Swelling  ? Tetanus Toxoid   ?  Other reaction(s): Unknown  ? Tetanus Toxoids Swelling  ? Lisinopril Rash  ?  Other reaction(s): Unknown  ? Niacin Rash  ?  Other reaction(s): Unknown  ? Sitagliptin Rash  ?  Other reaction(s): Unknown  ? Sulfamethoxazole-Trimethoprim Rash  ?  Other reaction(s): Unknown  ? ? ?Patient Measurements: ?Height: 5\' 5"  (165.1 cm) ?Weight: (!) 152.8 kg (336 lb 13.8 oz) ?IBW/kg (Calculated) : 57 ?Heparin Dosing Weight: 96.5kg ? ?Vital Signs: ?Temp: 96.4 ?F (35.8 ?C) (03/06 0500) ?Temp Source: Axillary (03/06 0500) ?BP: 105/65 (03/06 0500) ?Pulse Rate: 60 (03/06 0500) ? ?Labs: ?Recent Labs  ?  08/27/21 ?6712 08/27/21 ?1200 08/28/21 ?0123 08/28/21 ?4580 08/28/21 ?0911 08/28/21 ?1601 08/28/21 ?2333 08/29/21 ?0341  ?HGB 11.5*  --   --  11.0*  --   --   --  11.1*  ?HCT 36.7  --   --  35.7*  --   --   --  35.1*  ?PLT 101*  --   --  94*  --   --   --  75*  ?APTT 117*   < > 54*  --    < > 77* 79* 71*  ?LABPROT 20.4*  --   --   --   --   --   --   --   ?INR 1.8*  --   --   --   --   --   --   --   ?HEPARINUNFRC >1.10*  --  0.77*  --   --   --   --  0.71*  ?CREATININE 2.66*   < >  --  1.37*  --  1.20*  --  1.17*  ? < > = values in this interval not displayed.  ? ? ? ?Estimated Creatinine Clearance: 67.3 mL/min (A) (by C-G formula based on SCr of 1.17 mg/dL (H)). ? ? ?Medical History: ?Past Medical History:  ?Diagnosis Date  ? Bacteremia   ? CAD (coronary artery disease)   ? Diabetes mellitus without complication ([place])   ? Diastolic CHF ([place])   ? Hyperlipemia   ? Hypertension   ? Lymphedema   ? MI (myocardial infarction) ([place])   ? OSA (obstructive sleep apnea)   ? Severe aortic stenosis   ? ? ?Medications: No AC/APT pertinent drug allergies ?Heparin Dosing Weight: 96.5kg ?PTA: Apixaban 5mg  BID (last dose on 3/02 AM  PTA >> admitted on 3/02 - 1344) ?Inpatient: Apixaban (stopped w/o receiving any doses) >> Heparin gtt. ? ?Assessment: ?71yo F w/ h/o  HFpEF, severe AS (s/p TAVR), morbid obesity (BMI 57), obesity hypoventilation syndrome, OSA, DM, CAD, & HTN who presented to [facility] ED on 08/25/2021 due to complaints of altered mental 2/2 severe urosepsis and septic shock ISO Rt ureteral stone, AKI, ac metabolic enceph. Pharmacy consulted for conversion of DOAC to Heparin gtt. ?    ? ?Baseline Labs: ?aPTT - 30s ?INR - 1.6>1.9 (likely eliquis and underlying AKI at present.) ?Hgb - 11.8>12.5 ?Plts - 120>140 ?Trop 998>338 ? ?Date Time aPTT/HL Rate/Comment ?3/3 1530 66  Therapeutic x 1 (heparin infusion off since 1330 for procedure) Okay to resume per Dr Jonnie Finner ?3/4  0415:  117/> 1.10 SUPRAtherapeutic  ?3/4 1200 73  Therapeutic x1 ?3/4 1800 64  Subtherapeutic ?3/5       0123  54                    Subtherapeutic  ?3/5 0911 61  Subtherapeutic ?3/5 1601 77  Therapeutic x1 ?3/5       2333   79                    Therapeutic X 2  ?3/6       0341   71                    Therapeutic X 3 ? ?Goal of Therapy:  ?Heparin level 0.3-0.7 units/ml ?aPTT 66-102 seconds ?Monitor platelets by anticoagulation protocol: Yes ?  ?Plan:  ?3/5: APTT @ 2333 = 79, therapeutic X 2  ?Will check aPTT and HL on 3/6 with AM labs.  ? ?3/6 @ 0341:  aPTT = 71, HL = 0.71 ?aPTT is therapeutic but HL still slightly elevated.  Will continue to use aPTT to dose heparin.  ?Will continue pt on current rate and recheck aPTT and HL on 3/7 with AM labs.  ? ?Brigham Cobbins D ?Clinical Pharmacist ?08/29/2021 ?5:05 AM ? ? ? ? ? ?

## 2021-08-29 NOTE — Progress Notes (Signed)
Nutrition Follow Up Note  ? ?DOCUMENTATION CODES:  ? ?Morbid obesity ? ?INTERVENTION:  ? ?RD will add supplements once pt's diet is advanced  ? ?Recommend rena-vit po daily with diet advancement  ? ?NUTRITION DIAGNOSIS:  ? ?Inadequate oral intake related to inability to eat (pt sedated and ventilated) as evidenced by NPO status. ? ?GOAL:  ? ?Patient will meet greater than or equal to 90% of their needs ? ?MONITOR:  ? ?Diet advancement, Labs, Weight trends, Skin, I & O's ? ?ASSESSMENT:  ? ?71 y/o female with h/o HTN, DM, CAD, HLD, CHF, Afib and s/p TAVR who is admitted with obstructing ureteral stone, AKI and sepsis. Pt s/p cystoscopy and bilateral ureteral stent placement 3/2. ? ?Pt extubated today to bipap; pt tolerating well. Pt remains on CRRT since 3/3; plan is to discontinue today and follow up with HD. RD will add supplements and vitamins once pt's diet is advanced. Per chart, pt is weight stable since admission. Pt +2.2L on his I & Os.  ? ?Medications reviewed and include: colace, insulin, protonix, miralax, ceftriaxone, heparin ? ?Labs reviewed: K 3.9 wnl, BUN 29(H), creat 1.21(H), P 2.1(L) ?Wbc- 10.6(H) ?Cbgs- 183, 192, 207, 194 x 24 hrs ? ?Diet Order:   ?Diet Order   ? ?       ?  Diet NPO time specified  Diet effective now       ?  ? ?  ?  ? ?  ? ?EDUCATION NEEDS:  ? ?No education needs have been identified at this time ? ?Skin:  Skin Assessment: Reviewed RN Assessment (MASD, incision perineum) ? ?Last BM:  pta ? ?Height:  ? ?Ht Readings from Last 1 Encounters:  ?08/26/21 5\' 5"  (1.651 m)  ? ? ?Weight:  ? ?Wt Readings from Last 1 Encounters:  ?08/29/21 (!) 154.3 kg  ? ? ?Ideal Body Weight:  56.8 kg ? ?BMI:  Body mass index is 56.61 kg/m?. ? ?Estimated Nutritional Needs:  ? ?Kcal:  2500-2800kcal/day ? ?Protein:  >125g/day ? ?Fluid:  1.4-1.7L/day ? ?Koleen Distance MS, RD, LDN ?Please refer to [facility]ION for RD and/or RD on-call/weekend/after hours pager ? ?

## 2021-08-29 NOTE — Plan of Care (Signed)
Patient now extubated to BiPAP. CRRT off. Off pressors and off sedation. Remains on heparin infusion. Exam (see flowsheet). ? ? ?Problem: Education: ?Goal: Knowledge of General Education information will improve ?Description: Including pain rating scale, medication(s)/side effects and non-pharmacologic comfort measures ?Outcome: Progressing ?  ?Problem: Health Behavior/Discharge Planning: ?Goal: Ability to manage health-related needs will improve ?Outcome: Progressing ?  ?Problem: Clinical Measurements: ?Goal: Ability to maintain clinical measurements within normal limits will improve ?Outcome: Progressing ?Goal: Will remain free from infection ?Outcome: Progressing ?Goal: Diagnostic test results will improve ?Outcome: Progressing ?Goal: Respiratory complications will improve ?Outcome: Progressing ?Goal: Cardiovascular complication will be avoided ?Outcome: Progressing ?  ?Problem: Activity: ?Goal: Risk for activity intolerance will decrease ?Outcome: Progressing ?  ?Problem: Coping: ?Goal: Level of anxiety will decrease ?Outcome: Progressing ?  ?Problem: Elimination: ?Goal: Will not experience complications related to bowel motility ?Outcome: Progressing ?Goal: Will not experience complications related to urinary retention ?Outcome: Progressing ?  ?Problem: Pain Managment: ?Goal: General experience of comfort will improve ?Outcome: Progressing ?  ?Problem: Safety: ?Goal: Ability to remain free from injury will improve ?Outcome: Progressing ?  ?Problem: Skin Integrity: ?Goal: Risk for impaired skin integrity will decrease ?Outcome: Progressing ?  ?

## 2021-08-29 NOTE — TOC Progression Note (Signed)
Transition of Care (TOC) - Progression Note  ? ? ?Patient Details  ?Name: [name] ?MRN: [id number] ?Date of Birth: 1950/12/10 ? ?Transition of Care (TOC) CM/SW Contact  ?Shelbie Hutching, RN ?Phone Number: ?08/29/2021, 11:32 AM ? ?Clinical Narrative:    ?RNCM received a call from Cleda Clarks, she is worried that if the patient requires dialysis at discharge that she will need to be placed in long term care.   ?RNCM explained that we can definitely do a bed search but patient still on the vent this morning and requiring CRRT.  Patient will likely need short term rehab and can then be transitioned to long term care if needed.   ?Plan for extubation today.   ? ? ?Expected Discharge Plan:  (TBD) ?Barriers to Discharge: Continued Medical Work up ? ?Expected Discharge Plan and Services ?Expected Discharge Plan:  (TBD) ?  ?Discharge Planning Services: CM Consult ?  ?Living arrangements for the past 2 months: Apartment ?                ?DME Arranged: N/A ?DME Agency: NA ?  ?  ?  ?HH Arranged: RN ?[place] Agency: [employer] ?Date HH Agency Contacted: 08/26/21 ?Time [place]: 5732 ?  ? ? ?Social Determinants of Health (SDOH) Interventions ?  ? ?Readmission Risk Interventions ?No flowsheet data found. ? ?

## 2021-08-29 NOTE — Consult Note (Addendum)
ANTICOAGULATION CONSULT NOTE ? ?Pharmacy Consult for Heparin gtt (PTA Eliquis) ?Indication: atrial fibrillation ? ?Patient Measurements: ?Height: 5\' 5"  (165.1 cm) ?Weight: (!) 152.8 kg (336 lb 13.8 oz) ?IBW/kg (Calculated) : 57 ?Heparin Dosing Weight: 96.5kg ? ?Vital Signs: ?Temp: 96.8 ?F (36 ?C) (03/06 0600) ?Temp Source: Axillary (03/06 0500) ?BP: 101/61 (03/06 0930) ?Pulse Rate: 60 (03/06 0930) ? ?Labs: ?Recent Labs  ?  08/27/21 ?7510 08/27/21 ?1200 08/28/21 ?0123 08/28/21 ?2585 08/28/21 ?0911 08/28/21 ?1601 08/28/21 ?2333 08/29/21 ?0341  ?HGB 11.5*  --   --  11.0*  --   --   --  11.1*  ?HCT 36.7  --   --  35.7*  --   --   --  35.1*  ?PLT 101*  --   --  94*  --   --   --  75*  ?APTT 117*   < > 54*  --    < > 77* 79* 71*  ?LABPROT 20.4*  --   --   --   --   --   --   --   ?INR 1.8*  --   --   --   --   --   --   --   ?HEPARINUNFRC >1.10*  --  0.77*  --   --   --   --  0.71*  ?CREATININE 2.66*   < >  --  1.37*  --  1.20*  --  1.17*  ? < > = values in this interval not displayed.  ? ? ? ?Estimated Creatinine Clearance: 67.3 mL/min (A) (by C-G formula based on SCr of 1.17 mg/dL (H)). ? ? ?Medical History: ?Past Medical History:  ?Diagnosis Date  ? Bacteremia   ? CAD (coronary artery disease)   ? Diabetes mellitus without complication ([place])   ? Diastolic CHF ([place])   ? Hyperlipemia   ? Hypertension   ? Lymphedema   ? MI (myocardial infarction) ([place])   ? OSA (obstructive sleep apnea)   ? Severe aortic stenosis   ? ? ?Medications: No AC/APT pertinent drug allergies ?Heparin Dosing Weight: 96.5kg ?PTA: Apixaban 5mg  BID (last dose on 3/02 AM PTA >> admitted on 3/02 - 1344) ?Inpatient: Apixaban (stopped w/o receiving any doses) >> Heparin gtt. ? ?Assessment: ?71yo F w/ h/o  HFpEF, severe AS (s/p TAVR), morbid obesity (BMI 57), obesity hypoventilation syndrome, OSA, DM, CAD, & HTN who presented to [facility] ED on 08/25/2021 due to complaints of altered mental 2/2 severe urosepsis and septic shock ISO Rt ureteral stone, AKI, ac  metabolic enceph. Pharmacy consulted for conversion of DOAC to Heparin gtt. Since admission her platelets have been trending down from a low baseline. A decision was made to continue heparin but lower the therapeutic goal range ?   ? ?Goal of Therapy:  ?Heparin level 0.3-0.5 units/ml ?aPTT 66-85 seconds ?Monitor platelets by anticoagulation protocol: Yes ?  ?Plan:  ?Given new heparin level goals, rate adjustment required to achieve lower end of range ?Reduce heparin infusion rate to 1800 units/hr ?Recheck heparin level & aPTT 8 hours after rate change ?Daily CBC while on IV heparin ? ?Dallie Piles ?Clinical Pharmacist ?08/29/2021 ?11:04 AM ? ? ? ? ? ?

## 2021-08-29 NOTE — Consult Note (Signed)
ANTICOAGULATION CONSULT NOTE - Initial Consult ? ?Pharmacy Consult for Heparin gtt (PTA Eliquis) ?Indication: atrial fibrillation ? ?Allergies  ?Allergen Reactions  ? Tetanus Toxoid, Adsorbed Swelling  ? Tetanus Toxoid   ?  Other reaction(s): Unknown  ? Tetanus Toxoids Swelling  ? Lisinopril Rash  ?  Other reaction(s): Unknown  ? Niacin Rash  ?  Other reaction(s): Unknown  ? Sitagliptin Rash  ?  Other reaction(s): Unknown  ? Sulfamethoxazole-Trimethoprim Rash  ?  Other reaction(s): Unknown  ? ? ?Patient Measurements: ?Height: 5\' 5"  (165.1 cm) ?Weight: (!) 155.5 kg (342 lb 13 oz) ?IBW/kg (Calculated) : 57 ?Heparin Dosing Weight: 96.5kg ? ?Vital Signs: ?Temp: 97.3 ?F (36.3 ?C) (03/06 0000) ?Temp Source: Axillary (03/06 0000) ?BP: 105/80 (03/06 0115) ?Pulse Rate: 43 (03/06 0115) ? ?Labs: ?Recent Labs  ?  08/26/21 ?0330 08/26/21 ?1310 08/26/21 ?1530 08/26/21 ?1600 08/27/21 ?5277 08/27/21 ?1200 08/27/21 ?1600 08/27/21 ?1800 08/28/21 ?0123 08/28/21 ?0453 08/28/21 ?0911 08/28/21 ?1601 08/28/21 ?2333  ?HGB 12.5  --   --   --  11.5*  --   --   --   --  11.0*  --   --   --   ?HCT 41.2  --   --   --  36.7  --   --   --   --  35.7*  --   --   --   ?PLT 140*  --   --   --  101*  --   --   --   --  94*  --   --   --   ?APTT  --    < > 66*  --  117*   < >  --    < > 54*  --  61* 77* 79*  ?LABPROT 22.0*  --   --   --  20.4*  --   --   --   --   --   --   --   --   ?INR 1.9*  --   --   --  1.8*  --   --   --   --   --   --   --   --   ?HEPARINUNFRC  --   --  >1.10*  --  >1.10*  --   --   --  0.77*  --   --   --   --   ?CREATININE 3.99*   < >  --    < > 2.66*  --  1.69*  --   --  1.37*  --  1.20*  --   ? < > = values in this interval not displayed.  ? ? ? ?Estimated Creatinine Clearance: 66.4 mL/min (A) (by C-G formula based on SCr of 1.2 mg/dL (H)). ? ? ?Medical History: ?Past Medical History:  ?Diagnosis Date  ? Bacteremia   ? CAD (coronary artery disease)   ? Diabetes mellitus without complication ([place])   ? Diastolic CHF ([place])   ?  Hyperlipemia   ? Hypertension   ? Lymphedema   ? MI (myocardial infarction) ([place])   ? OSA (obstructive sleep apnea)   ? Severe aortic stenosis   ? ? ?Medications: No AC/APT pertinent drug allergies ?Heparin Dosing Weight: 96.5kg ?PTA: Apixaban 5mg  BID (last dose on 3/02 AM PTA >> admitted on 3/02 - 1344) ?Inpatient: Apixaban (stopped w/o receiving any doses) >> Heparin gtt. ? ?Assessment: ?71yo F w/ h/o  HFpEF, severe AS (s/p TAVR), morbid obesity (BMI 57),  obesity hypoventilation syndrome, OSA, DM, CAD, & HTN who presented to [facility] ED on 08/25/2021 due to complaints of altered mental 2/2 severe urosepsis and septic shock ISO Rt ureteral stone, AKI, ac metabolic enceph. Pharmacy consulted for conversion of DOAC to Heparin gtt. ?    ? ?Baseline Labs: ?aPTT - 30s ?INR - 1.6>1.9 (likely eliquis and underlying AKI at present.) ?Hgb - 11.8>12.5 ?Plts - 120>140 ?Trop 016>553 ? ?Date Time aPTT/HL Rate/Comment ?3/3 1530 66  Therapeutic x 1 (heparin infusion off since 1330 for procedure) Okay to resume per Dr Jonnie Finner ?3/4  0415:  117/> 1.10 SUPRAtherapeutic  ?3/4 1200 73  Therapeutic x1 ?3/4 1800 64  Subtherapeutic ?3/5       0123   54                    Subtherapeutic  ?3/5 0911 61  Subtherapeutic ?3/5 1601 77  Therapeutic x1 ?3/5       2333   79                    Therapeutic X 2  ? ?Goal of Therapy:  ?Heparin level 0.3-0.7 units/ml ?aPTT 66-102 seconds ?Monitor platelets by anticoagulation protocol: Yes ?  ?Plan:  ?3/6: APTT @ 2333 = 79, therapeutic X 2  ?Will check aPTT and HL on 3/6 with AM labs.  ? ?Felisa Zechman D ?Clinical Pharmacist ?08/29/2021 ?1:28 AM ? ? ? ? ? ?

## 2021-08-29 NOTE — Progress Notes (Signed)
NAME:  [name], MRN:  [id number], DOB:  [birth year], LOS: 4 ADMISSION DATE:  08/25/2021, CONSULTATION DATE:  08/25/2021 REFERRING MD:  Dr. Francine Graven, CHIEF COMPLAINT:  Altered Mental Status   Brief Pt Description / Synopsis:  71 y.o. Female admitted with Severe Urosepsis with Septic Shock in the setting of Right proximal ureteral stone with Hydronephrosis & large left renal stone, Acute Kidney Injury, and Acute Metabolic Encephalopathy.  She underwent urgent cystoscopy with bilateral ureteral stent placement.  She returns to ICU post procedure and remains intubated.  History of Present Illness:  [name] is a 71 y.o. female with a past medical history significant for HFpEF, severe aortic stenosis status post TAVR, morbid obesity, obesity hypoventilation syndrome, OSA, diabetes mellitus, coronary artery disease, hypertension who presented to [facility] ED on 08/25/2021 due to complaints of altered mental status.  Patient is currently intubated and sedated following return from the OR and no family is available, therefore history is obtained from chart review.  ED Course: Initial vital signs: Temperature 99.2, respiratory rate 27, pulse , blood pressure  Significant Labs: Potassium 3.4, bicarbonate 22, glucose 182, BUN 46, creatinine 2.31, calcium 8.5, anion gap 10, albumin 2.9, AST 72, ALT 36, total bilirubin 5.7, alkaline phosphatase 45, WBC 8.9, hemoglobin 11.8, hematocrit 37.5, platelets 120, PT 18.5, INR 1.6, lactic acid 4.3 COVID-19 PCR is negative Urinalysis consistent with UTI Imaging: Chest x-ray>>IMPRESSION: Mild right basilar subsegmental atelectasis or infiltrate is noted. CT chest>>IMPRESSION: Bilateral perihilar airspace filling pattern left more than right most consistent with acute edema/ARDS. Pneumonia not excluded, but there is no dense consolidation, lobar distribution or collapse. No effusion. Aortic Atherosclerosis CT abdomen pelvis>>IMPRESSION: Hydronephrosis on the right  due to 2 adjacent 6 mm stones in the proximal right ureter, at about the level of the lower pole of the kidney. Possibility of infection associated with the obstruction does exist. 1 cm stone in the left renal pelvis without evidence of obstruction presently. Chololithiasis without CT evidence of cholecystitis or obstruction. Insignificant 13 mm adrenal adenoma, unchanged since previous exam. Medications administered: 2.5 L of LR boluses, IV cefepime, Flagyl, vancomycin   Hospitalist were asked to admit for further work-up and treatment of severe urosepsis due to right proximal ureter stone with hydronephrosis and large left renal stone.  Urology was consulted.  She was taken urgently to the OR for cystoscopy with bilateral ureteral stent placement.  Anesthesia does report she required vasopressors Intra-Op.  She returns to ICU post procedure and remains intubated.  PCCM consulted for management of mechanical ventilation and critical illness.  08/29/21- patient with improved renal failure on ventilator.  She passed SBT and was liberated from MV.  I called and updated family today   Pertinent  Medical History  HFpEF Severe aortic stenosis status post TAVR Coronary artery disease Hypertension Hyperlipidemia Obesity hypoventilation syndrome OSA Morbid obesity Diabetes mellitus  Micro Data:  08/25/2021: SARS-CoV-2 and influenza PCR>> negative 08/25/2021: Blood culture>>+E Coli 08/25/2021: Urine>>+E coli 08/25/2021: Tracheal aspirate>> 08/25/2021: Strep pneumo urinary antigen>>negative 08/25/2021: Legionella urine antigen>>  Antimicrobials:  Cefepime x1 dose 3/2 Flagyl x1 dose 3/2 Vancomycin x1 dose 3/2 Ceftriaxone 3/2>>  Significant Hospital Events: Including procedures, antibiotic start and stop dates in addition to other pertinent events   3/2: Presented to ED, admitted by hospitalist.  Urology consulted, taken emergently to the OR for cystoscopy and bilateral ureteral stent placement.   Returns to ICU postop and remains intubated, PCCM consulted 3/3: worsening shock, added vasopressin; started CRRT 3/4: shock improving;  on CRRT; minimal vent settings  Interim History / Subjective:  Shock improving, now off Levophed. Remains on CRRT. On minimal vent settings. Not following commands yet but still working down on sedation. E coli in blood and urine is pan-sensitive.  Objective   Blood pressure 101/61, pulse 60, temperature (!) 96.8 F (36 C), resp. rate (!) 22, height 5' 5"  (1.651 m), weight (!) 152.8 kg, SpO2 100 %. CVP:  [6 mmHg-30 mmHg] 21 mmHg  Vent Mode: PRVC FiO2 (%):  [28 %] 28 % Set Rate:  [22 bmp] 22 bmp Vt Set:  [460 mL] 460 mL PEEP:  [5 cmH20] 5 cmH20 Plateau Pressure:  [15 cmH20-21 cmH20] 21 cmH20   Intake/Output Summary (Last 24 hours) at 08/29/2021 1005 Last data filed at 08/29/2021 7654 Gross per 24 hour  Intake 1372.69 ml  Output 1400 ml  Net -27.31 ml    Filed Weights   08/27/21 0428 08/28/21 0456 08/29/21 0400  Weight: (!) 156.9 kg (!) 155.5 kg (!) 152.8 kg    Examination: General: Caucasian female in NAD, intubated and sedated Lungs: decreased at the bases bilaterally, otherwise clear Cardiovascular: Regular rate and rhythm, S1-S2, no murmurs, rubs, gallops Abdomen: Morbidly obese, soft, nontender, nondistended, no guarding rebound tenderness, distant bowel sounds Extremities: No deformities, chronic venous trophic changes to BLE  Resolved Hospital Problem list     Assessment & Plan:   Septic Shock 2/2 E coli pyelonephritis with bacteremia Bilateral ureteral stones with severe left hydronephrosis s/p b/l ureterals stent placement (08/25/21) -Wean pressors as able; off vasopressin, cont Levo -MAP goal >65 -Stress dose steroids until off pressors -Continue ceftriaxone 2 g daily for planned 7 day course  Chronic HFpEF Demand ischemia Paroxsymal Atrial Fibrillation PMHx: Severe aortic stenosis s/p TAVR, CAD, MI, Hypertension,  Hyperlipidemia -Continuous cardiac monitoring -Maintain MAP >65 as above -Vasopressors as needed to maintain MAP goal -EF down to 45-50% from 50-55% prior in setting of acute infection -Optimize sepsis and ensure GDMT with Cardiology follow up -Heparin gtt for Afib  Acute Hypoxic Respiratory Failure in the setting of septic shock, multiple metabolic derangements PMHx: OSA -Full vent support, implement lung protective strategies -Plateau pressures less than 30 cm H20 -Wean FiO2 & PEEP as tolerated to maintain O2 sats >92% -Follow intermittent Chest X-ray & ABG as needed -Spontaneous Breathing Trials when respiratory parameters met and mental status permits -Implement VAP Bundle -Prn Bronchodilators  Acute Kidney Injury Anion Gap Metabolic Acidosis in the setting of Lactic Acidosis and AKI -Nephrology consulted -Continue CRRT; goal UF 0 -Avoid nephrotoxins -Renally dose medications -Strict I/O; monitor UOP  Mild Thrombocytopenia, likely in setting of septic shock -Monitor for S/Sx of bleeding -Trend CBC -Heparin as above -Transfuse for Hgb <7  Acute Metabolic Encephalopathy Sedation needs in the setting of mechanical ventilation -Maintain a RASS goal of 0 to -1 -Daily wake up assessment, SBT today -Treat sepsis and metabolic derangements as above  Diabetes Mellitus -CBG's q4h; Target range of 140 to 180 -SSI -Follow ICU Hypo/Hyperglycemia protocol   Best Practice (right click and "Reselect all SmartList Selections" daily)   Diet/type: NPO; tube feeds DVT prophylaxis: systemic heparin GI prophylaxis: PPI Lines: Central line, Dialysis Catheter, and yes and it is still needed Foley:  Yes, and it is still needed Code Status:  full code Last date of multidisciplinary goals of care discussion [08/26/21]  Labs   CBC: Recent Labs  Lab 08/25/21 2111 08/26/21 0330 08/27/21 0415 08/28/21 0453 08/29/21 0341  WBC 21.3* 27.7* 20.1* 16.1*  10.6*  HGB 12.3 12.5 11.5* 11.0*  11.1*  HCT 39.3 41.2 36.7 35.7* 35.1*  MCV 94.5 96.5 93.9 93.5 91.2  PLT 128* 140* 101* 94* 75*     Basic Metabolic Panel: Recent Labs  Lab 08/27/21 0415 08/27/21 1600 08/28/21 0453 08/28/21 1601 08/29/21 0341  NA 137 135 134* 137 136  K 3.9 3.6 3.2* 3.6 3.9  CL 104 103 101 103 101  CO2 24 24 25 25 25   GLUCOSE 165* 170* 196* 177* 224*  BUN 41* 28* 26* 26* 26*  CREATININE 2.66* 1.69* 1.37* 1.20* 1.17*  CALCIUM 8.3* 8.6* 9.6 8.4* 8.3*  MG 1.7  --  1.9  --  2.4  PHOS 3.6 2.6 2.4* 1.7* 1.7*    GFR: Estimated Creatinine Clearance: 67.3 mL/min (A) (by C-G formula based on SCr of 1.17 mg/dL (H)). Recent Labs  Lab 08/25/21 1939 08/25/21 2111 08/26/21 0330 08/26/21 1310 08/27/21 0415 08/27/21 0800 08/27/21 1456 08/28/21 0453 08/29/21 0341  PROCALCITON 124.35  --  >150.00  --  79.09  --   --   --   --   WBC  --    < > 27.7*  --  20.1*  --   --  16.1* 10.6*  LATICACIDVEN  --    < > 3.2* 3.9*  --  2.8* 2.2*  --   --    < > = values in this interval not displayed.     Liver Function Tests: Recent Labs  Lab 08/25/21 1128 08/26/21 1600 08/27/21 0415 08/27/21 1600 08/28/21 0453 08/28/21 1601 08/29/21 0341  AST 72*  --   --   --   --   --   --   ALT 36  --   --   --   --   --   --   ALKPHOS 45  --   --   --   --   --   --   BILITOT 0.9  --   --   --   --   --   --   PROT 5.7*  --   --   --   --   --   --   ALBUMIN 2.9*   < > 2.7* 2.5* 2.5* 2.4* 2.5*   < > = values in this interval not displayed.    No results for input(s): LIPASE, AMYLASE in the last 168 hours. No results for input(s): AMMONIA in the last 168 hours.  ABG    Component Value Date/Time   PHART 7.26 (L) 08/26/2021 1525   PCO2ART 37 08/26/2021 1525   PO2ART 97 08/26/2021 1525   HCO3 24.1 08/27/2021 0415   ACIDBASEDEF 2.8 (H) 08/27/2021 0415   O2SAT 67.2 08/27/2021 0415      Coagulation Profile: Recent Labs  Lab 08/25/21 1128 08/26/21 0330 08/27/21 0415  INR 1.6* 1.9* 1.8*      Cardiac Enzymes: No results for input(s): CKTOTAL, CKMB, CKMBINDEX, TROPONINI in the last 168 hours.  HbA1C: Hgb A1c MFr Bld  Date/Time Value Ref Range Status  08/25/2021 09:11 PM 7.6 (H) 4.8 - 5.6 % Final    Comment:    (NOTE)         Prediabetes: 5.7 - 6.4         Diabetes: >6.4         Glycemic control for adults with diabetes: <7.0   07/12/2017 01:09 AM 6.4 (H) 4.8 - 5.6 % Final    Comment:    (NOTE) Pre diabetes:  5.7%-6.4% Diabetes:              >6.4% Glycemic control for   <7.0% adults with diabetes     CBG: Recent Labs  Lab 08/28/21 1557 08/28/21 1924 08/28/21 2329 08/29/21 0333 08/29/21 0737  GLUCAP 173* 162* 185* 194* 207*     Review of Systems:   Unable to assess due to intubation and sedation   Past Medical History:  She,  has a past medical history of Bacteremia, CAD (coronary artery disease), Diabetes mellitus without complication ([place]), Diastolic CHF ([place]), Hyperlipemia, Hypertension, Lymphedema, MI (myocardial infarction) ([place]), OSA (obstructive sleep apnea), and Severe aortic stenosis.   Surgical History:   Past Surgical History:  Procedure Laterality Date   CARDIAC SURGERY     CYSTOSCOPY W/ URETERAL STENT PLACEMENT  08/25/2021   Procedure: CYSTOSCOPY WITH RETROGRADE PYELOGRAM/URETERAL STENT PLACEMENT;  Surgeon: Billey Co, MD;  Location: [facility] ORS;  Service: Urology;;   none     PACEMAKER INSERTION       Social History:   reports that she has never smoked. She has never used smokeless tobacco. She reports that she does not drink alcohol and does not use drugs.   Family History:  Her family history includes Cancer in her mother; Heart failure in her father.   Allergies Allergies  Allergen Reactions   Tetanus Toxoid, Adsorbed Swelling   Tetanus Toxoid     Other reaction(s): Unknown   Tetanus Toxoids Swelling   Lisinopril Rash    Other reaction(s): Unknown   Niacin Rash    Other reaction(s): Unknown   Sitagliptin  Rash    Other reaction(s): Unknown   Sulfamethoxazole-Trimethoprim Rash    Other reaction(s): Unknown     Home Medications  Prior to Admission medications   Medication Sig Start Date End Date Taking? Authorizing Provider  acetaminophen (TYLENOL) 325 MG tablet Take 2 tablets (650 mg total) by mouth every 6 (six) hours as needed for mild pain (or Fever >/= 101). 11/05/17  Yes Wieting, Richard, MD  amLODipine (NORVASC) 5 MG tablet Take 5 mg by mouth daily. 08/09/21  Yes [provider]  apixaban (ELIQUIS) 5 MG TABS tablet Take 1 tablet by mouth 2 (two) times daily. 08/10/17  Yes [provider]  atorvastatin (LIPITOR) 40 MG tablet Take 1 tablet (40 mg total) by mouth daily at 6 PM. 05/09/16  Yes Epifanio Lesches, MD  cefpodoxime (VANTIN) 100 MG tablet Take 1 tablet (100 mg total) by mouth 2 (two) times daily. 11/29/20  Yes Billey Co, MD  Cholecalciferol 50 MCG (2000 UT) CAPS Take 1 capsule by mouth daily.   Yes [provider]  docusate sodium (COLACE) 100 MG capsule Take 1 capsule (100 mg total) by mouth daily. 11/05/17  Yes Wieting, Richard, MD  fenofibrate (TRICOR) 145 MG tablet Take 1 tablet by mouth daily. 04/17/16  Yes [provider]  furosemide (LASIX) 40 MG tablet Take 1 tablet (40 mg total) by mouth daily. 07/17/16  Yes Vaughan Basta, MD  insulin aspart (NOVOLOG FLEXPEN) 100 UNIT/ML FlexPen Inject 30 Units into the skin daily. 10/28/19  Yes [provider]  Insulin Glargine (BASAGLAR KWIKPEN) 100 UNIT/ML Inject 70 Units into the skin daily. 04/23/17  Yes [provider]  metFORMIN (GLUCOPHAGE) 1000 MG tablet Take 1 tablet (1,000 mg total) by mouth daily with breakfast. 05/22/17  Yes Max Sane, MD  metoprolol tartrate (LOPRESSOR) 25 MG tablet Take 1 tablet (25 mg total) by mouth 2 (two) times daily. 05/22/17  Yes Max Sane, MD  Potassium Chloride ER 20 MEQ TBCR Take 1 tablet by mouth daily. 02/09/20  Yes [provider]  amiodarone (PACERONE) 200 MG tablet Take 1 tablet (200 mg total) by mouth daily. Patient not taking: Reported on 08/25/2021 11/06/17   Loletha Grayer, MD  BD PEN NEEDLE NANO 2ND GEN 32G X 4 MM MISC 4 (four) times daily. use as directed 02/09/20   [provider]  tamsulosin (FLOMAX) 0.4 MG CAPS capsule Take 1 capsule (0.4 mg total) by mouth daily. Patient not taking: Reported on 08/25/2021 02/05/20   Billey Co, MD     Critical care time: 36 minutes     Ottie Glazier, MD 08/29/21 10:05 AM

## 2021-08-29 NOTE — Progress Notes (Signed)
Pt extubated to BiPAP per MD order. Pt tol well ?

## 2021-08-30 ENCOUNTER — Inpatient Hospital Stay: Payer: Medicare Other

## 2021-08-30 LAB — CBC
HCT: 34.3 % — ABNORMAL LOW (ref 36.0–46.0)
Hemoglobin: 10.8 g/dL — ABNORMAL LOW (ref 12.0–15.0)
MCH: 28.9 pg (ref 26.0–34.0)
MCHC: 31.5 g/dL (ref 30.0–36.0)
MCV: 91.7 fL (ref 80.0–100.0)
Platelets: 81 10*3/uL — ABNORMAL LOW (ref 150–400)
RBC: 3.74 MIL/uL — ABNORMAL LOW (ref 3.87–5.11)
RDW: 15.2 % (ref 11.5–15.5)
WBC: 11.7 10*3/uL — ABNORMAL HIGH (ref 4.0–10.5)
nRBC: 1.7 % — ABNORMAL HIGH (ref 0.0–0.2)

## 2021-08-30 LAB — HEPARIN LEVEL (UNFRACTIONATED)
Heparin Unfractionated: 0.43 IU/mL (ref 0.30–0.70)
Heparin Unfractionated: 0.44 IU/mL (ref 0.30–0.70)

## 2021-08-30 LAB — RENAL FUNCTION PANEL
Albumin: 2.4 g/dL — ABNORMAL LOW (ref 3.5–5.0)
Anion gap: 11 (ref 5–15)
BUN: 40 mg/dL — ABNORMAL HIGH (ref 8–23)
CO2: 24 mmol/L (ref 22–32)
Calcium: 8.3 mg/dL — ABNORMAL LOW (ref 8.9–10.3)
Chloride: 103 mmol/L (ref 98–111)
Creatinine, Ser: 1.86 mg/dL — ABNORMAL HIGH (ref 0.44–1.00)
GFR, Estimated: 29 mL/min — ABNORMAL LOW (ref >60)
Glucose, Bld: 218 mg/dL — ABNORMAL HIGH (ref 70–99)
Phosphorus: 1.8 mg/dL — ABNORMAL LOW (ref 2.5–4.6)
Potassium: 3.9 mmol/L (ref 3.5–5.1)
Sodium: 138 mmol/L (ref 135–145)

## 2021-08-30 LAB — APTT
aPTT: 59 seconds — ABNORMAL HIGH (ref 24–36)
aPTT: 61 seconds — ABNORMAL HIGH (ref 24–36)

## 2021-08-30 LAB — GLUCOSE, CAPILLARY
Glucose-Capillary: 152 mg/dL — ABNORMAL HIGH (ref 70–99)
Glucose-Capillary: 182 mg/dL — ABNORMAL HIGH (ref 70–99)
Glucose-Capillary: 185 mg/dL — ABNORMAL HIGH (ref 70–99)
Glucose-Capillary: 191 mg/dL — ABNORMAL HIGH (ref 70–99)
Glucose-Capillary: 199 mg/dL — ABNORMAL HIGH (ref 70–99)
Glucose-Capillary: 200 mg/dL — ABNORMAL HIGH (ref 70–99)
Glucose-Capillary: 211 mg/dL — ABNORMAL HIGH (ref 70–99)
Glucose-Capillary: 215 mg/dL — ABNORMAL HIGH (ref 70–99)

## 2021-08-30 LAB — LEGIONELLA PNEUMOPHILA SEROGP 1 UR AG: L. pneumophila Serogp 1 Ur Ag: NEGATIVE

## 2021-08-30 LAB — CULTURE, BLOOD (ROUTINE X 2)
Culture: NO GROWTH
Special Requests: ADEQUATE

## 2021-08-30 LAB — MAGNESIUM: Magnesium: 2.3 mg/dL (ref 1.7–2.4)

## 2021-08-30 MED ORDER — DEXTROSE 5 % IV SOLN
30.0000 mmol | Freq: Once | INTRAVENOUS | Status: AC
  Administered 2021-08-30: 30 mmol via INTRAVENOUS
  Filled 2021-08-30: qty 10

## 2021-08-30 MED ORDER — SODIUM CHLORIDE 0.9 % IV SOLN
2.0000 g | INTRAVENOUS | Status: AC
  Administered 2021-08-30 – 2021-09-03 (×5): 2 g via INTRAVENOUS
  Filled 2021-08-30 (×4): qty 2
  Filled 2021-08-30 (×2): qty 20

## 2021-08-30 MED ORDER — INSULIN GLARGINE-YFGN 100 UNIT/ML ~~LOC~~ SOLN
20.0000 [IU] | Freq: Every day | SUBCUTANEOUS | Status: DC
  Administered 2021-08-30 – 2021-09-06 (×8): 20 [IU] via SUBCUTANEOUS
  Filled 2021-08-30 (×8): qty 0.2

## 2021-08-30 NOTE — Plan of Care (Signed)
Patient remains on BiPAP. Remains on heparin infusion. ? ? ?Problem: Education: ?Goal: Knowledge of General Education information will improve ?Description: Including pain rating scale, medication(s)/side effects and non-pharmacologic comfort measures ?Outcome: Not Progressing ?  ?Problem: Health Behavior/Discharge Planning: ?Goal: Ability to manage health-related needs will improve ?Outcome: Not Progressing ?  ?Problem: Clinical Measurements: ?Goal: Ability to maintain clinical measurements within normal limits will improve ?Outcome: Not Progressing ?Goal: Will remain free from infection ?Outcome: Not Progressing ?Goal: Diagnostic test results will improve ?Outcome: Not Progressing ?Goal: Respiratory complications will improve ?Outcome: Not Progressing ?Goal: Cardiovascular complication will be avoided ?Outcome: Not Progressing ?  ?Problem: Activity: ?Goal: Risk for activity intolerance will decrease ?Outcome: Not Progressing ?  ?Problem: Coping: ?Goal: Level of anxiety will decrease ?Outcome: Not Progressing ?  ?Problem: Elimination: ?Goal: Will not experience complications related to bowel motility ?Outcome: Not Progressing ?Goal: Will not experience complications related to urinary retention ?Outcome: Not Progressing ?  ?Problem: Pain Managment: ?Goal: General experience of comfort will improve ?Outcome: Not Progressing ?  ?Problem: Safety: ?Goal: Ability to remain free from injury will improve ?Outcome: Not Progressing ?  ?Problem: Skin Integrity: ?Goal: Risk for impaired skin integrity will decrease ?Outcome: Not Progressing ?  ?

## 2021-08-30 NOTE — Progress Notes (Signed)
[place], [place] 08/30/21  Subjective:   Hospital day # 5   cvs: paced rhythm, off pressors pulm: Patient was extubated yesterday.  She is now on BiPAP.  She appears tachypneic. Somnolent this morning. Renal: 03/06 0701 - 03/07 0700 In: 591.1 [I.V.:491; IV Piggyback:100.1] Out: 521 [Urine:165] Lab Results  Component Value Date   CREATININE 1.86 (H) 08/30/2021   CREATININE 1.21 (H) 08/29/2021   CREATININE 1.17 (H) 08/29/2021   Foley in place very small amount of urine noted. Getting heparin drip  Objective:  Vital signs in last 24 hours:  Temp:  [97.5 F (36.4 C)-100 F (37.8 C)] 100 F (37.8 C) (03/07 0400) Pulse Rate:  [50-85] 60 (03/07 0700) Resp:  [20-39] 28 (03/07 0700) BP: (93-148)/(41-86) 126/58 (03/07 0700) SpO2:  [90 %-100 %] 100 % (03/07 0700) FiO2 (%):  [30 %] 30 % (03/07 0700) Weight:  [154.3 kg-155.5 kg] 155.5 kg (03/07 0423)  Weight change: 1.5 kg Filed Weights   08/29/21 0400 08/29/21 1000 08/30/21 0423  Weight: (!) 152.8 kg (!) 154.3 kg (!) 155.5 kg    Intake/Output:    Intake/Output Summary (Last 24 hours) at 08/30/2021 0839 Last data filed at 08/30/2021 0600 Gross per 24 hour  Intake 591.09 ml  Output 491 ml  Net 100.09 ml      Physical Exam: General: Obese lady, critically ill, laying in the bed  HEENT Positive pressure ventilation mask in place  Pulm/lungs Coarse crackles, BiPAP  CVS/Heart Irregular, paced rhythm  Abdomen:  Soft  Extremities: Some dependent edema  Neurologic: Very somnolent.  Did not wake up with verbal and tactile stimulation  Skin: No acute rashes  Access: Left IJ temporary dialysis catheter   Foley in place with small amount of dark urine    Basic Metabolic Panel:  Recent Labs  Lab 08/27/21 0415 08/27/21 1600 08/28/21 0453 08/28/21 1601 08/29/21 0341 08/29/21 1145 08/29/21 1544 08/30/21 0419  NA 137   < > 134* 137 136  --  136 138  K 3.9   < > 3.2* 3.6 3.9  --  3.9 3.9   CL 104   < > 101 103 101  --  103 103  CO2 24   < > _0 --  25 24  GLUCOSE 165*   < > 196* 177* 224*  --  203* 218*  BUN 41*   < > 26* 26* 26*  --  29* 40*  CREATININE 2.66*   < > 1.37* 1.20* 1.17*  --  1.21* 1.86*  CALCIUM 8.3*   < > 9.6 8.4* 8.3*  --  8.2* 8.3*  MG 1.7  --  1.9  --  2.4  --   --  2.3  PHOS 3.6   < > 2.4* 1.7* 1.7* 2.5 2.1* 1.8*   < > = values in this interval not displayed.      CBC: Recent Labs  Lab 08/26/21 0330 08/27/21 0415 08/28/21 0453 08/29/21 0341 08/30/21 0419  WBC 27.7* 20.1* 16.1* 10.6* 11.7*  HGB 12.5 11.5* 11.0* 11.1* 10.8*  HCT 41.2 36.7 35.7* 35.1* 34.3*  MCV 96.5 93.9 93.5 91.2 91.7  PLT 140* 101* 94* 75* 81*      No results found for: HEPBSAG, HEPBSAB, HEPBIGM    Microbiology:  Recent Results (from the past 240 hour(s))  Blood Culture (routine x 2)     Status: Abnormal   Collection Time: 08/25/21 12:29 PM   Specimen: BLOOD RIGHT HAND  Result Value Ref Range Status   Specimen Description   Final    BLOOD RIGHT HAND Performed at [facility], [address] [place], [place] [address]    Special Requests   Final    BOTTLES DRAWN AEROBIC AND ANAEROBIC Blood Culture results may not be optimal due to an inadequate volume of blood received in culture bottles Performed at [facility], [address] [place], [place] [address]    Culture  Setup Time   Final    GRAM NEGATIVE RODS IN BOTH AEROBIC AND ANAEROBIC BOTTLES CRITICAL RESULT CALLED TO, READ BACK BY AND VERIFIED WITH: MORGAN HICKS AT 2207 ON 08/25/21 BY SS Performed at [facility], [place] [address], [place], [place] [address]    Culture ESCHERICHIA COLI (A)  Final   Report Status 08/28/2021 FINAL  Final   Organism ID, Bacteria ESCHERICHIA COLI  Final      Susceptibility   Escherichia coli - MIC*    AMPICILLIN 8 SENSITIVE Sensitive     CEFAZOLIN <=4 SENSITIVE Sensitive     CEFEPIME <=0.12 SENSITIVE Sensitive     CEFTAZIDIME <=1 SENSITIVE  Sensitive     CEFTRIAXONE <=0.25 SENSITIVE Sensitive     CIPROFLOXACIN <=0.25 SENSITIVE Sensitive     GENTAMICIN <=1 SENSITIVE Sensitive     IMIPENEM <=0.25 SENSITIVE Sensitive     TRIMETH/SULFA <=20 SENSITIVE Sensitive     AMPICILLIN/SULBACTAM <=2 SENSITIVE Sensitive     PIP/TAZO <=4 SENSITIVE Sensitive     * ESCHERICHIA COLI  Blood Culture ID Panel (Reflexed)     Status: Abnormal   Collection Time: 08/25/21 12:29 PM  Result Value Ref Range Status   Enterococcus faecalis NOT DETECTED NOT DETECTED Final   Enterococcus Faecium NOT DETECTED NOT DETECTED Final   Listeria monocytogenes NOT DETECTED NOT DETECTED Final   Staphylococcus species NOT DETECTED NOT DETECTED Final   Staphylococcus aureus (BCID) NOT DETECTED NOT DETECTED Final   Staphylococcus epidermidis NOT DETECTED NOT DETECTED Final   Staphylococcus lugdunensis NOT DETECTED NOT DETECTED Final   Streptococcus species NOT DETECTED NOT DETECTED Final   Streptococcus agalactiae NOT DETECTED NOT DETECTED Final   Streptococcus pneumoniae NOT DETECTED NOT DETECTED Final   Streptococcus pyogenes NOT DETECTED NOT DETECTED Final   A.calcoaceticus-baumannii NOT DETECTED NOT DETECTED Final   Bacteroides fragilis NOT DETECTED NOT DETECTED Final   Enterobacterales DETECTED (A) NOT DETECTED Final    Comment: Enterobacterales represent a large order of gram negative bacteria, not a single organism. CRITICAL RESULT CALLED TO, READ BACK BY AND VERIFIED WITH: MORGAN HICKS AT 2207 ON 08/25/21 BY SS    Enterobacter cloacae complex NOT DETECTED NOT DETECTED Final   Escherichia coli DETECTED (A) NOT DETECTED Final    Comment: CRITICAL RESULT CALLED TO, READ BACK BY AND VERIFIED WITH: MORGAN HICKS AT 2207 ON 08/25/21 BY SS    Klebsiella aerogenes NOT DETECTED NOT DETECTED Final   Klebsiella oxytoca NOT DETECTED NOT DETECTED Final   Klebsiella pneumoniae NOT DETECTED NOT DETECTED Final   Proteus species NOT DETECTED NOT DETECTED Final   Salmonella  species NOT DETECTED NOT DETECTED Final   Serratia marcescens NOT DETECTED NOT DETECTED Final   Haemophilus influenzae NOT DETECTED NOT DETECTED Final   Neisseria meningitidis NOT DETECTED NOT DETECTED Final   Pseudomonas aeruginosa NOT DETECTED NOT DETECTED Final   Stenotrophomonas maltophilia NOT DETECTED NOT DETECTED Final   Candida albicans NOT DETECTED NOT DETECTED Final   Candida auris NOT DETECTED NOT DETECTED Final  Candida glabrata NOT DETECTED NOT DETECTED Final   Candida krusei NOT DETECTED NOT DETECTED Final   Candida parapsilosis NOT DETECTED NOT DETECTED Final   Candida tropicalis NOT DETECTED NOT DETECTED Final   Cryptococcus neoformans/gattii NOT DETECTED NOT DETECTED Final   CTX-M ESBL NOT DETECTED NOT DETECTED Final   Carbapenem resistance IMP NOT DETECTED NOT DETECTED Final   Carbapenem resistance KPC NOT DETECTED NOT DETECTED Final   Carbapenem resistance NDM NOT DETECTED NOT DETECTED Final   Carbapenem resist OXA 48 LIKE NOT DETECTED NOT DETECTED Final   Carbapenem resistance VIM NOT DETECTED NOT DETECTED Final    Comment: Performed at [facility], [address] [place], [place] [address]  Urine Culture     Status: Abnormal   Collection Time: 08/25/21  1:47 PM   Specimen: In/Out Cath Urine  Result Value Ref Range Status   Specimen Description   Final    IN/OUT CATH URINE Performed at [facility], [address] [place], [place] [address]    Special Requests   Final    NONE Performed at [facility], [address] [place], [place] [address]    Culture >=100,000 COLONIES/mL ESCHERICHIA COLI (A)  Final   Report Status 08/28/2021 FINAL  Final   Organism ID, Bacteria ESCHERICHIA COLI (A)  Final      Susceptibility   Escherichia coli - MIC*    AMPICILLIN 8 SENSITIVE Sensitive     CEFAZOLIN <=4 SENSITIVE Sensitive     CEFEPIME <=0.12 SENSITIVE Sensitive     CEFTRIAXONE <=0.25 SENSITIVE Sensitive     CIPROFLOXACIN <=0.25  SENSITIVE Sensitive     GENTAMICIN <=1 SENSITIVE Sensitive     IMIPENEM <=0.25 SENSITIVE Sensitive     NITROFURANTOIN <=16 SENSITIVE Sensitive     TRIMETH/SULFA <=20 SENSITIVE Sensitive     AMPICILLIN/SULBACTAM <=2 SENSITIVE Sensitive     PIP/TAZO <=4 SENSITIVE Sensitive     * >=100,000 COLONIES/mL ESCHERICHIA COLI  Resp Panel by RT-PCR (Flu A&B, Covid) Nasopharyngeal Swab     Status: None   Collection Time: 08/25/21  3:05 PM   Specimen: Nasopharyngeal Swab; Nasopharyngeal(NP) swabs in vial transport medium  Result Value Ref Range Status   SARS Coronavirus 2 by RT PCR NEGATIVE NEGATIVE Final    Comment: (NOTE) SARS-CoV-2 target nucleic acids are NOT DETECTED.  The SARS-CoV-2 RNA is generally detectable in upper respiratory specimens during the acute phase of infection. The lowest concentration of SARS-CoV-2 viral copies this assay can detect is 138 copies/mL. A negative result does not preclude SARS-Cov-2 infection and should not be used as the sole basis for treatment or other patient management decisions. A negative result may occur with  improper specimen collection/handling, submission of specimen other than nasopharyngeal swab, presence of viral mutation(s) within the areas targeted by this assay, and inadequate number of viral copies(<138 copies/mL). A negative result must be combined with clinical observations, patient history, and epidemiological information. The expected result is Negative.  Fact Sheet for Patients:  EntrepreneurPulse.com.au  Fact Sheet for Healthcare Providers:  IncredibleEmployment.be  This test is no t yet approved or cleared by the [place] FDA and  has been authorized for detection and/or diagnosis of SARS-CoV-2 by FDA under an Emergency Use Authorization (EUA). This EUA will remain  in effect (meaning this test can be used) for the duration of the COVID-19 declaration under Section 564(b)(1) of the Act,  21 U.S.C.section 360bbb-3(b)(1), unless the authorization is terminated  or revoked sooner.  Influenza A by PCR NEGATIVE NEGATIVE Final   Influenza B by PCR NEGATIVE NEGATIVE Final    Comment: (NOTE) The Xpert Xpress SARS-CoV-2/FLU/RSV plus assay is intended as an aid in the diagnosis of influenza from Nasopharyngeal swab specimens and should not be used as a sole basis for treatment. Nasal washings and aspirates are unacceptable for Xpert Xpress SARS-CoV-2/FLU/RSV testing.  Fact Sheet for Patients: EntrepreneurPulse.com.au  Fact Sheet for Healthcare Providers: IncredibleEmployment.be  This test is not yet approved or cleared by the [place] FDA and has been authorized for detection and/or diagnosis of SARS-CoV-2 by FDA under an Emergency Use Authorization (EUA). This EUA will remain in effect (meaning this test can be used) for the duration of the COVID-19 declaration under Section 564(b)(1) of the Act, 21 U.S.C. section 360bbb-3(b)(1), unless the authorization is terminated or revoked.  Performed at [facility], [address] [place], [place] [address]   MRSA Next Gen by PCR, Nasal     Status: None   Collection Time: 08/25/21  6:44 PM   Specimen: Nasal Mucosa; Nasal Swab  Result Value Ref Range Status   MRSA by PCR Next Gen NOT DETECTED NOT DETECTED Final    Comment: (NOTE) The GeneXpert MRSA Assay (FDA approved for NASAL specimens only), is one component of a comprehensive MRSA colonization surveillance program. It is not intended to diagnose MRSA infection nor to guide or monitor treatment for MRSA infections. Test performance is not FDA approved in patients less than 10 years old. Performed at [facility], [address] [place], [place] [address]   Blood Culture (routine x 2)     Status: None   Collection Time: 08/25/21  7:38 PM   Specimen: BLOOD  Result Value Ref Range Status   Specimen  Description BLOOD RIGHT HAND  Final   Special Requests   Final    BOTTLES DRAWN AEROBIC AND ANAEROBIC Blood Culture adequate volume   Culture   Final    NO GROWTH 5 DAYS Performed at [facility], [address], [place], [place] [address]    Report Status 08/30/2021 FINAL  Final  Culture, Respiratory w Gram Stain     Status: None   Collection Time: 08/26/21  8:21 AM   Specimen: Tracheal Aspirate; Respiratory  Result Value Ref Range Status   Specimen Description   Final    TRACHEAL ASPIRATE Performed at [facility], [address], [place], [place] [address]    Special Requests   Final    NONE Performed at [facility], [address] [place], [place] [address]    Gram Stain   Final    RARE SQUAMOUS EPITHELIAL CELLS PRESENT RARE WBC PRESENT, PREDOMINANTLY MONONUCLEAR NO ORGANISMS SEEN    Culture   Final    RARE Normal respiratory flora-no Staph aureus or Pseudomonas seen Performed at [facility], [address], [place], [place] [address]    Report Status 08/29/2021 FINAL  Final    Coagulation Studies: No results for input(s): LABPROT, INR in the last 72 hours.   Urinalysis: No results for input(s): COLORURINE, LABSPEC, PHURINE, GLUCOSEU, HGBUR, BILIRUBINUR, KETONESUR, PROTEINUR, UROBILINOGEN, NITRITE, LEUKOCYTESUR in the last 72 hours.  Invalid input(s): APPERANCEUR    Imaging: No results found.   Medications:    sodium chloride Stopped (08/29/21 2138)   cefTRIAXone (ROCEPHIN)  IV Stopped (08/29/21 2208)   dexmedetomidine (PRECEDEX) IV infusion Stopped (08/28/21 2246)   heparin 1,800 Units/hr (08/30/21 0810)   sodium phosphate  Dextrose 5% IVPB 30 mmol (08/30/21 0655)    chlorhexidine gluconate (MEDLINE KIT)  15 mL Mouth Rinse BID   Chlorhexidine Gluconate Cloth  6 each Topical Q0600   docusate  100 mg Per Tube BID   insulin aspart  0-20 Units Subcutaneous Q4H   insulin glargine-yfgn  20 Units Subcutaneous Daily    mouth rinse  15 mL Mouth Rinse 10 times per day   pantoprazole (PROTONIX) IV  40 mg Intravenous Daily   polyethylene glycol  17 g Per Tube Daily   sodium chloride flush  10-40 mL Intracatheter Q12H   sodium chloride, acetaminophen, heparin sodium (porcine), ipratropium-albuterol, midazolam, ondansetron **OR** ondansetron (ZOFRAN) IV, sodium chloride flush  Assessment/ Plan:  71 y.o. female with  morbid obesity (BMI 57), DM, CAD, OSA, HTN   admitted on 10/29/9792 for Renal colic [I01] Sepsis ([place]) [A41.9] Urinary tract infection with hematuria, site unspecified [N39.0, R31.9] Community acquired pneumonia, unspecified laterality [J18.9] Sepsis, due to unspecified organism, unspecified whether acute organ dysfunction present ([place]) [A41.9]   # AKI on St 3b AKI likely secondary to sepsis Baseline creatinine of 1.3 from 2019.  Admit creatinine of 2.31 that peaked at 4.47  03/06 0701 - 03/07 0700 In: 591.1 [I.V.:491; IV Piggyback:100.1] Out: 521 [Urine:165]    CRRT was discontinued yesterday on March 6. Electrolytes are acceptable.  No indication for dialysis today.  We will reevaluate daily.  #Acute respiratory failure Extubated on March 6 Currently requiring NIPPV Obtain chest x-ray  # Sepsis with UTI, hypotension, shock # Nephrolithiasis with rt proximal stone with hydronephrosis and left UPJ stone without hydronephrosis. On 3/2, patient underwent cystoscopy with bilateral ureteral stents placed by Dr. Diamantina Providence. Patient was found to have purulent urine in bladder. Plan for outpatient b.l uretereroscopy/laser lithotripsy in 1-2 months   LOS: [place] 3/7/20238:39 [place], [place]  Note: This note was prepared with Dragon dictation. Any transcription errors are unintentional

## 2021-08-30 NOTE — Consult Note (Signed)
ANTICOAGULATION CONSULT NOTE ? ?Pharmacy Consult for Heparin gtt (PTA Eliquis) ?Indication: atrial fibrillation ? ?Patient Measurements: ?Height: 5\' 5"  (165.1 cm) ?Weight: (!) 154.3 kg (340 lb 2.7 oz) ?IBW/kg (Calculated) : 57 ?Heparin Dosing Weight: 96.5kg ? ?Vital Signs: ?Temp: 98 ?F (36.7 ?C) (03/07 0000) ?Temp Source: Axillary (03/07 0000) ?BP: 117/49 (03/07 0000) ?Pulse Rate: 60 (03/07 0000) ? ?Labs: ?Recent Labs  ?  08/27/21 ?4098 08/27/21 ?1200 08/28/21 ?0123 08/28/21 ?1191 08/28/21 ?0911 08/28/21 ?1601 08/28/21 ?2333 08/29/21 ?0341 08/29/21 ?1544 08/30/21 ?0015  ?HGB 11.5*  --   --  11.0*  --   --   --  11.1*  --   --   ?HCT 36.7  --   --  35.7*  --   --   --  35.1*  --   --   ?PLT 101*  --   --  94*  --   --   --  75*  --   --   ?APTT 117*   < > 54*  --    < > 77* 79* 71*  --  59*  ?LABPROT 20.4*  --   --   --   --   --   --   --   --   --   ?INR 1.8*  --   --   --   --   --   --   --   --   --   ?HEPARINUNFRC >1.10*  --  0.77*  --   --   --   --  0.71*  --  0.43  ?CREATININE 2.66*   < >  --  1.37*  --  1.20*  --  1.17* 1.21*  --   ? < > = values in this interval not displayed.  ? ? ? ?Estimated Creatinine Clearance: 65.5 mL/min (A) (by C-G formula based on SCr of 1.21 mg/dL (H)). ? ? ?Medical History: ?Past Medical History:  ?Diagnosis Date  ? Bacteremia   ? CAD (coronary artery disease)   ? Diabetes mellitus without complication ([place])   ? Diastolic CHF ([place])   ? Hyperlipemia   ? Hypertension   ? Lymphedema   ? MI (myocardial infarction) ([place])   ? OSA (obstructive sleep apnea)   ? Severe aortic stenosis   ? ? ?Medications: No AC/APT pertinent drug allergies ?Heparin Dosing Weight: 96.5kg ?PTA: Apixaban 5mg  BID (last dose on 3/02 AM PTA >> admitted on 3/02 - 1344) ?Inpatient: Apixaban (stopped w/o receiving any doses) >> Heparin gtt. ? ?Assessment: ?71yo F w/ h/o  HFpEF, severe AS (s/p TAVR), morbid obesity (BMI 57), obesity hypoventilation syndrome, OSA, DM, CAD, & HTN who presented to [facility] ED on 08/25/2021  due to complaints of altered mental 2/2 severe urosepsis and septic shock ISO Rt ureteral stone, AKI, ac metabolic enceph. Pharmacy consulted for conversion of DOAC to Heparin gtt. Since admission her platelets have been trending down from a low baseline. A decision was made to continue heparin but lower the therapeutic goal range ?   ? ?Goal of Therapy:  ?Heparin level 0.3-0.5 units/ml ?aPTT 66-85 seconds ?Monitor platelets by anticoagulation protocol: Yes ? ?3/07 0015 aPTT 59, subtherapeutic, HL 0.43 therapeutic ?  ?Plan:  ?Due to decrease HL range, will continue heparin infusion rate at 1800 units/hr at this time. ?Will recheck heparin level & aPTT with AM labs ?Daily CBC while on IV heparin ? ?Renda Rolls, PharmD, MBA ?08/30/2021 ?1:11 AM ? ? ? ? ? ? ?

## 2021-08-30 NOTE — Consult Note (Signed)
PHARMACY CONSULT NOTE - FOLLOW UP ? ?Pharmacy Consult for Electrolyte Monitoring and Replacement  ? ?Recent Labs: ?Potassium (mmol/L)  ?Date Value  ?08/30/2021 3.9  ? ?Magnesium (mg/dL)  ?Date Value  ?08/30/2021 2.3  ? ?Calcium (mg/dL)  ?Date Value  ?08/30/2021 8.3 (L)  ? ?Albumin (g/dL)  ?Date Value  ?08/30/2021 2.4 (L)  ? ?Phosphorus (mg/dL)  ?Date Value  ?08/30/2021 1.8 (L)  ? ?Sodium (mmol/L)  ?Date Value  ?08/30/2021 138  ? ? ? ?Assessment: ?71 yo F w/ h/o HFpEF, Severe AS, CAD (AC-paced), HTN, BMI 57, DM, severe urosepsis with septic shock ISO Rt prox ureteral stone w/ hydronephrosis & large Lt renal stone c/b AKI, acute metabolic encephalopathy. S/p cytoscopy B/L ureteral stents on 3/02. Pharmacy consulted for the mgmt of electrolytes. ? ?Goal of Therapy:  ?Lytes WNL ? ?Plan:  ?Scr 1.17>1.86;  ?Current Lytes are stable WNL, but Scr risen despite maintained UOP. Will CTM and replace as needed. ?CTM daily BMP, Mg, PHos ? ?Lorna Dibble ,PharmD ?Clinical Pharmacist ?08/30/2021 1:38 PM ? ?

## 2021-08-30 NOTE — Progress Notes (Signed)
NAME:  [name], MRN:  [id number], DOB:  [birth year], LOS: 5 ADMISSION DATE:  08/25/2021, CONSULTATION DATE:  08/25/2021 REFERRING MD:  Dr. Francine Graven, CHIEF COMPLAINT:  Altered Mental Status   Brief Pt Description / Synopsis:  71 y.o. Female admitted with Severe Urosepsis with Septic Shock in the setting of Right proximal ureteral stone with Hydronephrosis & large left renal stone, Acute Kidney Injury, and Acute Metabolic Encephalopathy.  She underwent urgent cystoscopy with bilateral ureteral stent placement.  She returns to ICU post procedure and remains intubated.  History of Present Illness:  [name] is a 71 y.o. female with a past medical history significant for HFpEF, severe aortic stenosis status post TAVR, morbid obesity, obesity hypoventilation syndrome, OSA, diabetes mellitus, coronary artery disease, hypertension who presented to [facility] ED on 08/25/2021 due to complaints of altered mental status.  Patient is currently intubated and sedated following return from the OR and no family is available, therefore history is obtained from chart review.  ED Course: Initial vital signs: Temperature 99.2, respiratory rate 27, pulse , blood pressure  Significant Labs: Potassium 3.4, bicarbonate 22, glucose 182, BUN 46, creatinine 2.31, calcium 8.5, anion gap 10, albumin 2.9, AST 72, ALT 36, total bilirubin 5.7, alkaline phosphatase 45, WBC 8.9, hemoglobin 11.8, hematocrit 37.5, platelets 120, PT 18.5, INR 1.6, lactic acid 4.3 COVID-19 PCR is negative Urinalysis consistent with UTI Imaging: Chest x-ray>>IMPRESSION: Mild right basilar subsegmental atelectasis or infiltrate is noted. CT chest>>IMPRESSION: Bilateral perihilar airspace filling pattern left more than right most consistent with acute edema/ARDS. Pneumonia not excluded, but there is no dense consolidation, lobar distribution or collapse. No effusion. Aortic Atherosclerosis CT abdomen pelvis>>IMPRESSION: Hydronephrosis on the right  due to 2 adjacent 6 mm stones in the proximal right ureter, at about the level of the lower pole of the kidney. Possibility of infection associated with the obstruction does exist. 1 cm stone in the left renal pelvis without evidence of obstruction presently. Chololithiasis without CT evidence of cholecystitis or obstruction. Insignificant 13 mm adrenal adenoma, unchanged since previous exam. Medications administered: 2.5 L of LR boluses, IV cefepime, Flagyl, vancomycin   Hospitalist were asked to admit for further work-up and treatment of severe urosepsis due to right proximal ureter stone with hydronephrosis and large left renal stone.  Urology was consulted.  She was taken urgently to the OR for cystoscopy with bilateral ureteral stent placement.  Anesthesia does report she required vasopressors Intra-Op.  She returns to ICU post procedure and remains intubated.  PCCM consulted for management of mechanical ventilation and critical illness.  08/29/21- patient with improved renal failure on ventilator.  She passed SBT and was liberated from MV.  I called and updated family today  08/30/21- patient improved s/p treatment of E coli bacteremia and AKI.  Plan for HD tommorow.    Pertinent  Medical History  HFpEF Severe aortic stenosis status post TAVR Coronary artery disease Hypertension Hyperlipidemia Obesity hypoventilation syndrome OSA Morbid obesity Diabetes mellitus  Micro Data:  08/25/2021: SARS-CoV-2 and influenza PCR>> negative 08/25/2021: Blood culture>>+E Coli 08/25/2021: Urine>>+E coli 08/25/2021: Tracheal aspirate>> 08/25/2021: Strep pneumo urinary antigen>>negative 08/25/2021: Legionella urine antigen>>  Antimicrobials:  Cefepime x1 dose 3/2 Flagyl x1 dose 3/2 Vancomycin x1 dose 3/2 Ceftriaxone 3/2>>  Significant Hospital Events: Including procedures, antibiotic start and stop dates in addition to other pertinent events   3/2: Presented to ED, admitted by hospitalist.  Urology  consulted, taken emergently to the OR for cystoscopy and bilateral ureteral stent placement.  Returns  to ICU postop and remains intubated, PCCM consulted 3/3: worsening shock, added vasopressin; started CRRT 3/4: shock improving; on CRRT; minimal vent settings  Interim History / Subjective:  Shock improving, now off Levophed. Remains on CRRT. On minimal vent settings. Not following commands yet but still working down on sedation. E coli in blood and urine is pan-sensitive.  Objective   Blood pressure 136/63, pulse 60, temperature 98.4 F (36.9 C), temperature source Axillary, resp. rate (!) 32, height _0  (1.651 m), weight (!) 155.5 kg, SpO2 99 %. CVP:  [7 mmHg-20 mmHg] 10 mmHg  FiO2 (%):  [28 %-30 %] 28 %   Intake/Output Summary (Last 24 hours) at 08/30/2021 1125 Last data filed at 08/30/2021 1001 Gross per 24 hour  Intake 601.09 ml  Output 490 ml  Net 111.09 ml    Filed Weights   08/29/21 0400 08/29/21 1000 08/30/21 0423  Weight: (!) 152.8 kg (!) 154.3 kg (!) 155.5 kg    Examination: General: Caucasian female in NAD, intubated and sedated Lungs: decreased at the bases bilaterally, otherwise clear Cardiovascular: Regular rate and rhythm, S1-S2, no murmurs, rubs, gallops Abdomen: Morbidly obese, soft, nontender, nondistended, no guarding rebound tenderness, distant bowel sounds Extremities: No deformities, chronic venous trophic changes to BLE  Resolved Hospital Problem list     Assessment & Plan:   Septic Shock 2/2 E coli pyelonephritis with bacteremia Bilateral ureteral stones with severe left hydronephrosis s/p b/l ureterals stent placement (08/25/21) -Wean pressors as able; off vasopressin, cont Levo -MAP goal >65 -Stress dose steroids until off pressors -Continue ceftriaxone 2 g daily for planned 7 day course  Chronic HFpEF Demand ischemia Paroxsymal Atrial Fibrillation PMHx: Severe aortic stenosis s/p TAVR, CAD, MI, Hypertension, Hyperlipidemia -Continuous cardiac  monitoring -Maintain MAP >65 as above -Vasopressors as needed to maintain MAP goal -EF down to 45-50% from 50-55% prior in setting of acute infection -Optimize sepsis and ensure GDMT with Cardiology follow up -Heparin gtt for Afib  Acute Hypoxic Respiratory Failure in the setting of septic shock, multiple metabolic derangements PMHx: OSA -Full vent support, implement lung protective strategies -Plateau pressures less than 30 cm H20 -Wean FiO2 & PEEP as tolerated to maintain O2 sats >92% -Follow intermittent Chest X-ray & ABG as needed -Spontaneous Breathing Trials when respiratory parameters met and mental status permits -Implement VAP Bundle -Prn Bronchodilators  Acute Kidney Injury Anion Gap Metabolic Acidosis in the setting of Lactic Acidosis and AKI -Nephrology consulted -Continue CRRT; goal UF 0 -Avoid nephrotoxins -Renally dose medications -Strict I/O; monitor UOP  Mild Thrombocytopenia, likely in setting of septic shock -Monitor for S/Sx of bleeding -Trend CBC -Heparin as above -Transfuse for Hgb <7  Acute Metabolic Encephalopathy Sedation needs in the setting of mechanical ventilation -Maintain a RASS goal of 0 to -1 -Daily wake up assessment, SBT today -Treat sepsis and metabolic derangements as above  Diabetes Mellitus -CBG's q4h; Target range of 140 to 180 -SSI -Follow ICU Hypo/Hyperglycemia protocol   Best Practice (right click and "Reselect all SmartList Selections" daily)   Diet/type: NPO; tube feeds DVT prophylaxis: systemic heparin GI prophylaxis: PPI Lines: Central line, Dialysis Catheter, and yes and it is still needed Foley:  Yes, and it is still needed Code Status:  full code Last date of multidisciplinary goals of care discussion [08/26/21]  Labs   CBC: Recent Labs  Lab 08/26/21 0330 08/27/21 0415 08/28/21 0453 08/29/21 0341 08/30/21 0419  WBC 27.7* 20.1* 16.1* 10.6* 11.7*  HGB 12.5 11.5* 11.0* 11.1* 10.8*  HCT  41.2 36.7 35.7* 35.1*  34.3*  MCV 96.5 93.9 93.5 91.2 91.7  PLT 140* 101* 94* 75* 81*     Basic Metabolic Panel: Recent Labs  Lab 08/27/21 0415 08/27/21 1600 08/28/21 0453 08/28/21 1601 08/29/21 0341 08/29/21 1145 08/29/21 1544 08/30/21 0419  NA 137   < > 134* 137 136  --  136 138  K 3.9   < > 3.2* 3.6 3.9  --  3.9 3.9  CL 104   < > 101 103 101  --  103 103  CO2 24   < > _0 --  25 24  GLUCOSE 165*   < > 196* 177* 224*  --  203* 218*  BUN 41*   < > 26* 26* 26*  --  29* 40*  CREATININE 2.66*   < > 1.37* 1.20* 1.17*  --  1.21* 1.86*  CALCIUM 8.3*   < > 9.6 8.4* 8.3*  --  8.2* 8.3*  MG 1.7  --  1.9  --  2.4  --   --  2.3  PHOS 3.6   < > 2.4* 1.7* 1.7* 2.5 2.1* 1.8*   < > = values in this interval not displayed.    GFR: Estimated Creatinine Clearance: 42.8 mL/min (A) (by C-G formula based on SCr of 1.86 mg/dL (H)). Recent Labs  Lab 08/25/21 1939 08/25/21 2111 08/26/21 0330 08/26/21 1310 08/27/21 0415 08/27/21 0800 08/27/21 1456 08/28/21 0453 08/29/21 0341 08/30/21 0419  PROCALCITON 124.35  --  >150.00  --  79.09  --   --   --   --   --   WBC  --    < > 27.7*  --  20.1*  --   --  16.1* 10.6* 11.7*  LATICACIDVEN  --    < > 3.2* 3.9*  --  2.8* 2.2*  --   --   --    < > = values in this interval not displayed.     Liver Function Tests: Recent Labs  Lab 08/25/21 1128 08/26/21 1600 08/28/21 0453 08/28/21 1601 08/29/21 0341 08/29/21 1544 08/30/21 0419  AST 72*  --   --   --   --   --   --   ALT 36  --   --   --   --   --   --   ALKPHOS 45  --   --   --   --   --   --   BILITOT 0.9  --   --   --   --   --   --   PROT 5.7*  --   --   --   --   --   --   ALBUMIN 2.9*   < > 2.5* 2.4* 2.5* 2.6* 2.4*   < > = values in this interval not displayed.    No results for input(s): LIPASE, AMYLASE in the last 168 hours. No results for input(s): AMMONIA in the last 168 hours.  ABG    Component Value Date/Time   PHART 7.48 (H) 08/29/2021 2147   PCO2ART 34 08/29/2021 2147   PO2ART 81  (L) 08/29/2021 2147   HCO3 25.3 08/29/2021 2147   ACIDBASEDEF 0.2 08/29/2021 1627   O2SAT 97.9 08/29/2021 2147      Coagulation Profile: Recent Labs  Lab 08/25/21 1128 08/26/21 0330 08/27/21 0415  INR 1.6* 1.9* 1.8*     Cardiac Enzymes: No results for input(s): CKTOTAL, CKMB, CKMBINDEX, TROPONINI in the  last 168 hours.  HbA1C: Hgb A1c MFr Bld  Date/Time Value Ref Range Status  08/25/2021 09:11 PM 7.6 (H) 4.8 - 5.6 % Final    Comment:    (NOTE)         Prediabetes: 5.7 - 6.4         Diabetes: >6.4         Glycemic control for adults with diabetes: <7.0   07/12/2017 01:09 AM 6.4 (H) 4.8 - 5.6 % Final    Comment:    (NOTE) Pre diabetes:          5.7%-6.4% Diabetes:              >6.4% Glycemic control for   <7.0% adults with diabetes     CBG: Recent Labs  Lab 08/29/21 1549 08/29/21 1925 08/30/21 0010 08/30/21 0402 08/30/21 0723  GLUCAP 183* 199* 199* 200* 215*     Review of Systems:   Unable to assess due to intubation and sedation   Past Medical History:  She,  has a past medical history of Bacteremia, CAD (coronary artery disease), Diabetes mellitus without complication ([place]), Diastolic CHF ([place]), Hyperlipemia, Hypertension, Lymphedema, MI (myocardial infarction) ([place]), OSA (obstructive sleep apnea), and Severe aortic stenosis.   Surgical History:   Past Surgical History:  Procedure Laterality Date   CARDIAC SURGERY     CYSTOSCOPY W/ URETERAL STENT PLACEMENT  08/25/2021   Procedure: CYSTOSCOPY WITH RETROGRADE PYELOGRAM/URETERAL STENT PLACEMENT;  Surgeon: Billey Co, MD;  Location: [facility] ORS;  Service: Urology;;   none     PACEMAKER INSERTION       Social History:   reports that she has never smoked. She has never used smokeless tobacco. She reports that she does not drink alcohol and does not use drugs.   Family History:  Her family history includes Cancer in her mother; Heart failure in her father.   Allergies Allergies  Allergen  Reactions   Tetanus Toxoid, Adsorbed Swelling   Tetanus Toxoid     Other reaction(s): Unknown   Tetanus Toxoids Swelling   Lisinopril Rash    Other reaction(s): Unknown   Niacin Rash    Other reaction(s): Unknown   Sitagliptin Rash    Other reaction(s): Unknown   Sulfamethoxazole-Trimethoprim Rash    Other reaction(s): Unknown     Home Medications  Prior to Admission medications   Medication Sig Start Date End Date Taking? Authorizing Provider  acetaminophen (TYLENOL) 325 MG tablet Take 2 tablets (650 mg total) by mouth every 6 (six) hours as needed for mild pain (or Fever >/= 101). 11/05/17  Yes Wieting, Richard, MD  amLODipine (NORVASC) 5 MG tablet Take 5 mg by mouth daily. 08/09/21  Yes [provider]  apixaban (ELIQUIS) 5 MG TABS tablet Take 1 tablet by mouth 2 (two) times daily. 08/10/17  Yes [provider]  atorvastatin (LIPITOR) 40 MG tablet Take 1 tablet (40 mg total) by mouth daily at 6 PM. 05/09/16  Yes Epifanio Lesches, MD  cefpodoxime (VANTIN) 100 MG tablet Take 1 tablet (100 mg total) by mouth 2 (two) times daily. 11/29/20  Yes Billey Co, MD  Cholecalciferol 50 MCG (2000 UT) CAPS Take 1 capsule by mouth daily.   Yes [provider]  docusate sodium (COLACE) 100 MG capsule Take 1 capsule (100 mg total) by mouth daily. 11/05/17  Yes Wieting, Richard, MD  fenofibrate (TRICOR) 145 MG tablet Take 1 tablet by mouth daily. 04/17/16  Yes [provider]  furosemide (LASIX)  40 MG tablet Take 1 tablet (40 mg total) by mouth daily. 07/17/16  Yes Vaughan Basta, MD  insulin aspart (NOVOLOG FLEXPEN) 100 UNIT/ML FlexPen Inject 30 Units into the skin daily. 10/28/19  Yes [provider]  Insulin Glargine (BASAGLAR KWIKPEN) 100 UNIT/ML Inject 70 Units into the skin daily. 04/23/17  Yes [provider]  metFORMIN (GLUCOPHAGE) 1000 MG tablet Take 1 tablet (1,000 mg total) by mouth daily with breakfast. 05/22/17  Yes Max Sane, MD  metoprolol tartrate (LOPRESSOR) 25 MG tablet Take 1 tablet (25 mg total) by mouth 2 (two) times daily. 05/22/17  Yes Max Sane, MD  Potassium Chloride ER 20 MEQ TBCR Take 1 tablet by mouth daily. 02/09/20  Yes [provider]  amiodarone (PACERONE) 200 MG tablet Take 1 tablet (200 mg total) by mouth daily. Patient not taking: Reported on 08/25/2021 11/06/17   Loletha Grayer, MD  BD PEN NEEDLE NANO 2ND GEN 32G X 4 MM MISC 4 (four) times daily. use as directed 02/09/20   [provider]  tamsulosin (FLOMAX) 0.4 MG CAPS capsule Take 1 capsule (0.4 mg total) by mouth daily. Patient not taking: Reported on 08/25/2021 02/05/20   Billey Co, MD     Critical care provider statement:   Total critical care time: 33 minutes   Performed by: Lanney Gins MD   Critical care time was exclusive of separately billable procedures and treating other patients.   Critical care was necessary to treat or prevent imminent or life-threatening deterioration.   Critical care was time spent personally by me on the following activities: development of treatment plan with patient and/or surrogate as well as nursing, discussions with consultants, evaluation of patient's response to treatment, examination of patient, obtaining history from patient or surrogate, ordering and performing treatments and interventions, ordering and review of laboratory studies, ordering and review of radiographic studies, pulse oximetry and re-evaluation of patient's condition.    Ottie Glazier, M.D.  Pulmonary & Critical Care Medicine

## 2021-08-30 NOTE — Progress Notes (Addendum)
1000 Patient placed on 2 liters nasal canula. Tolerated well x 2 hours. Dr. Lanney Gins requested that patient be placed back on BiPAP. Remained on BiPAP the rest of the day. Patient only groaned during every 2 hour mouth care, She did not answer questions or try to communicate. After repeated stimulation she would follow very limited commands. She did not open her eyes all day and would squeeze them shut when pupils were checked. Patient did remove the pillows that padded her arms and legs repeatly when no one was in her room. Remains in AV paced all day. Urine output has increased significantly and urine color is now amber. ?

## 2021-08-30 NOTE — Consult Note (Signed)
ANTICOAGULATION CONSULT NOTE ? ?Pharmacy Consult for Heparin gtt (PTA Eliquis) ?Indication: atrial fibrillation ? ?Patient Measurements: ?Height: 5\' 5"  (165.1 cm) ?Weight: (!) 155.5 kg (342 lb 13 oz) ?IBW/kg (Calculated) : 57 ?Heparin Dosing Weight: 96.5kg ? ?Vital Signs: ?Temp: 100 ?F (37.8 ?C) (03/07 0400) ?Temp Source: Axillary (03/07 0400) ?BP: 131/79 (03/07 0500) ?Pulse Rate: 61 (03/07 0500) ? ?Labs: ?Recent Labs  ?  08/28/21 ?0453 08/28/21 ?0911 08/29/21 ?0341 08/29/21 ?1544 08/30/21 ?0015 08/30/21 ?0419  ?HGB 11.0*  --  11.1*  --   --  10.8*  ?HCT 35.7*  --  35.1*  --   --  34.3*  ?PLT 94*  --  75*  --   --  81*  ?APTT  --    < > 71*  --  59* 61*  ?HEPARINUNFRC  --   --  0.71*  --  0.43 0.44  ?CREATININE 1.37*   < > 1.17* 1.21*  --  1.86*  ? < > = values in this interval not displayed.  ? ? ? ?Estimated Creatinine Clearance: 42.8 mL/min (A) (by C-G formula based on SCr of 1.86 mg/dL (H)). ? ? ?Medical History: ?Past Medical History:  ?Diagnosis Date  ? Bacteremia   ? CAD (coronary artery disease)   ? Diabetes mellitus without complication ([place])   ? Diastolic CHF ([place])   ? Hyperlipemia   ? Hypertension   ? Lymphedema   ? MI (myocardial infarction) ([place])   ? OSA (obstructive sleep apnea)   ? Severe aortic stenosis   ? ? ?Medications: No AC/APT pertinent drug allergies ?Heparin Dosing Weight: 96.5kg ?PTA: Apixaban 5mg  BID (last dose on 3/02 AM PTA >> admitted on 3/02 - 1344) ?Inpatient: Apixaban (stopped w/o receiving any doses) >> Heparin gtt. ? ?Assessment: ?71yo F w/ h/o  HFpEF, severe AS (s/p TAVR), morbid obesity (BMI 57), obesity hypoventilation syndrome, OSA, DM, CAD, & HTN who presented to [facility] ED on 08/25/2021 due to complaints of altered mental 2/2 severe urosepsis and septic shock ISO Rt ureteral stone, AKI, ac metabolic enceph. Pharmacy consulted for conversion of DOAC to Heparin gtt. Since admission her platelets have been trending down from a low baseline. A decision was made to continue heparin but  lower the therapeutic goal range ?   ? ?Goal of Therapy:  ?Heparin level 0.3-0.5 units/ml ?aPTT 66-85 seconds ?Monitor platelets by anticoagulation protocol: Yes ? ?3/7 0015 aPTT 59, subtherapeutic, HL 0.43 therapeutic ?3/7 0419 HL 0.44 therapeutic, aPTT 61 ?  ?Plan:  ?Continue heparin infusion rate at 1800 units/hr. ?Recheck HL daily w/ AM labs while therapeutic  ?Daily CBC while on IV heparin ? ?Renda Rolls, PharmD, MBA ?08/30/2021 ?5:21 AM ? ? ? ? ? ? ?

## 2021-08-31 ENCOUNTER — Encounter: Payer: Medicare Other | Admitting: Internal Medicine

## 2021-08-31 ENCOUNTER — Inpatient Hospital Stay: Payer: Medicare Other

## 2021-08-31 LAB — RENAL FUNCTION PANEL
Albumin: 2.2 g/dL — ABNORMAL LOW (ref 3.5–5.0)
Anion gap: 9 (ref 5–15)
BUN: 54 mg/dL — ABNORMAL HIGH (ref 8–23)
CO2: 27 mmol/L (ref 22–32)
Calcium: 8.1 mg/dL — ABNORMAL LOW (ref 8.9–10.3)
Chloride: 103 mmol/L (ref 98–111)
Creatinine, Ser: 2.06 mg/dL — ABNORMAL HIGH (ref 0.44–1.00)
GFR, Estimated: 25 mL/min — ABNORMAL LOW (ref >60)
Glucose, Bld: 195 mg/dL — ABNORMAL HIGH (ref 70–99)
Phosphorus: 3.4 mg/dL (ref 2.5–4.6)
Potassium: 3.7 mmol/L (ref 3.5–5.1)
Sodium: 139 mmol/L (ref 135–145)

## 2021-08-31 LAB — GLUCOSE, CAPILLARY
Glucose-Capillary: 143 mg/dL — ABNORMAL HIGH (ref 70–99)
Glucose-Capillary: 148 mg/dL — ABNORMAL HIGH (ref 70–99)
Glucose-Capillary: 162 mg/dL — ABNORMAL HIGH (ref 70–99)
Glucose-Capillary: 164 mg/dL — ABNORMAL HIGH (ref 70–99)
Glucose-Capillary: 180 mg/dL — ABNORMAL HIGH (ref 70–99)
Glucose-Capillary: 184 mg/dL — ABNORMAL HIGH (ref 70–99)

## 2021-08-31 LAB — CBC
HCT: 35 % — ABNORMAL LOW (ref 36.0–46.0)
Hemoglobin: 11.1 g/dL — ABNORMAL LOW (ref 12.0–15.0)
MCH: 29 pg (ref 26.0–34.0)
MCHC: 31.7 g/dL (ref 30.0–36.0)
MCV: 91.4 fL (ref 80.0–100.0)
Platelets: 91 10*3/uL — ABNORMAL LOW (ref 150–400)
RBC: 3.83 MIL/uL — ABNORMAL LOW (ref 3.87–5.11)
RDW: 15.4 % (ref 11.5–15.5)
WBC: 16 10*3/uL — ABNORMAL HIGH (ref 4.0–10.5)
nRBC: 0.7 % — ABNORMAL HIGH (ref 0.0–0.2)

## 2021-08-31 LAB — HEPATITIS B SURFACE ANTIGEN: Hepatitis B Surface Ag: NONREACTIVE

## 2021-08-31 LAB — HEPARIN LEVEL (UNFRACTIONATED): Heparin Unfractionated: 0.41 IU/mL (ref 0.30–0.70)

## 2021-08-31 LAB — MAGNESIUM: Magnesium: 2.4 mg/dL (ref 1.7–2.4)

## 2021-08-31 LAB — HEPATITIS C ANTIBODY: HCV Ab: NONREACTIVE

## 2021-08-31 LAB — HEPATITIS B SURFACE ANTIBODY,QUALITATIVE: Hep B S Ab: NONREACTIVE

## 2021-08-31 MED ORDER — FUROSEMIDE 10 MG/ML IJ SOLN
80.0000 mg | Freq: Once | INTRAMUSCULAR | Status: AC
  Administered 2021-08-31: 80 mg via INTRAVENOUS
  Filled 2021-08-31: qty 8

## 2021-08-31 MED ORDER — ALBUMIN HUMAN 25 % IV SOLN
25.0000 g | Freq: Once | INTRAVENOUS | Status: AC
  Filled 2021-08-31: qty 100

## 2021-08-31 NOTE — TOC Progression Note (Signed)
Transition of Care (TOC) - Progression Note  ? ? ?Patient Details  ?Name: [name] ?MRN: [id number] ?Date of Birth: 04/04/1951 ? ?Transition of Care (TOC) CM/SW Contact  ?Shelbie Hutching, RN ?Phone Number: ?08/31/2021, 12:17 PM ? ?Clinical Narrative:    ?TOC continues to follow, patient is on Bipap and not currently following commands. No longer requiring CRRT will get HD today.  ? ? ?Expected Discharge Plan:  (TBD) ?Barriers to Discharge: Continued Medical Work up ? ?Expected Discharge Plan and Services ?Expected Discharge Plan:  (TBD) ?  ?Discharge Planning Services: CM Consult ?  ?Living arrangements for the past 2 months: Apartment ?                ?DME Arranged: N/A ?DME Agency: NA ?  ?  ?  ?HH Arranged: RN ?[place] Agency: [employer] ?Date HH Agency Contacted: 08/26/21 ?Time [place]: [phone] ?  ? ? ?Social Determinants of Health (SDOH) Interventions ?  ? ?Readmission Risk Interventions ?No flowsheet data found. ? ?

## 2021-08-31 NOTE — Progress Notes (Signed)
NAME:  [name], MRN:  [id number], DOB:  Aug 18, 1950, LOS: 6 ADMISSION DATE:  08/25/2021, CONSULTATION DATE:  08/25/2021 REFERRING MD:  Dr. Francine Graven, CHIEF COMPLAINT:  Altered Mental Status   Brief Pt Description / Synopsis:  71 y.o. Female admitted with Severe Urosepsis with Septic Shock in the setting of Right proximal ureteral stone with Hydronephrosis & large left renal stone, Acute Kidney Injury, and Acute Metabolic Encephalopathy.  She underwent urgent cystoscopy with bilateral ureteral stent placement.  She returns to ICU post procedure and remains intubated.  History of Present Illness:  [name] is a 71 y.o. female with a past medical history significant for HFpEF, severe aortic stenosis status post TAVR, morbid obesity, obesity hypoventilation syndrome, OSA, diabetes mellitus, coronary artery disease, hypertension who presented to [facility] ED on 08/25/2021 due to complaints of altered mental status.  Patient is currently intubated and sedated following return from the OR and no family is available, therefore history is obtained from chart review.  ED Course: Initial vital signs: Temperature 99.2, respiratory rate 27, pulse , blood pressure  Significant Labs: Potassium 3.4, bicarbonate 22, glucose 182, BUN 46, creatinine 2.31, calcium 8.5, anion gap 10, albumin 2.9, AST 72, ALT 36, total bilirubin 5.7, alkaline phosphatase 45, WBC 8.9, hemoglobin 11.8, hematocrit 37.5, platelets 120, PT 18.5, INR 1.6, lactic acid 4.3 COVID-19 PCR is negative Urinalysis consistent with UTI Imaging: Chest x-ray>>IMPRESSION: Mild right basilar subsegmental atelectasis or infiltrate is noted. CT chest>>IMPRESSION: Bilateral perihilar airspace filling pattern left more than right most consistent with acute edema/ARDS. Pneumonia not excluded, but there is no dense consolidation, lobar distribution or collapse. No effusion. Aortic Atherosclerosis CT abdomen pelvis>>IMPRESSION: Hydronephrosis on the right  due to 2 adjacent 6 mm stones in the proximal right ureter, at about the level of the lower pole of the kidney. Possibility of infection associated with the obstruction does exist. 1 cm stone in the left renal pelvis without evidence of obstruction presently. Chololithiasis without CT evidence of cholecystitis or obstruction. Insignificant 13 mm adrenal adenoma, unchanged since previous exam. Medications administered: 2.5 L of LR boluses, IV cefepime, Flagyl, vancomycin   Hospitalist were asked to admit for further work-up and treatment of severe urosepsis due to right proximal ureter stone with hydronephrosis and large left renal stone.  Urology was consulted.  She was taken urgently to the OR for cystoscopy with bilateral ureteral stent placement.  Anesthesia does report she required vasopressors Intra-Op.  She returns to ICU post procedure and remains intubated.  PCCM consulted for management of mechanical ventilation and critical illness.  08/29/21- patient with improved renal failure on ventilator.  She passed SBT and was liberated from MV.  I called and updated family today  08/30/21- patient improved s/p treatment of E coli bacteremia and AKI.  Plan for HD tommorow.   08/31/21- patient with sepsis from UTI and bacteremia Ecoli . Nephrology for possibe HD  .  Encephalopathy from septic and metabolic encephalopathy. CT head is done due to poor mentation but is normal and ABG istable.    Newer results are available. Click to view them now.      Component 6 d ago  Specimen Description IN/OUT CATH URINE  Performed at [facility], [address], [place], [place] [address]   Special Requests NONE  Performed at [facility], [address] [place], [place] [address]   Culture >=100,000 COLONIES/mL ESCHERICHIA COLI Abnormal    Report Status 08/28/2021 FINAL   Organism ID, Bacteria ESCHERICHIA  COLI Abnormal    Resulting Agency [facility]       Pertinent  Medical  History  HFpEF Severe aortic stenosis status post TAVR Coronary artery disease Hypertension Hyperlipidemia Obesity hypoventilation syndrome OSA Morbid obesity Diabetes mellitus  Micro Data:  08/25/2021: SARS-CoV-2 and influenza PCR>> negative 08/25/2021: Blood culture>>+E Coli 08/25/2021: Urine>>+E coli 08/25/2021: Tracheal aspirate>> 08/25/2021: Strep pneumo urinary antigen>>negative 08/25/2021: Legionella urine antigen>>  Antimicrobials:  Cefepime x1 dose 3/2 Flagyl x1 dose 3/2 Vancomycin x1 dose 3/2 Ceftriaxone 3/2>>  Significant Hospital Events: Including procedures, antibiotic start and stop dates in addition to other pertinent events   3/2: Presented to ED, admitted by hospitalist.  Urology consulted, taken emergently to the OR for cystoscopy and bilateral ureteral stent placement.  Returns to ICU postop and remains intubated, PCCM consulted 3/3: worsening shock, added vasopressin; started CRRT 3/4: shock improving; on CRRT; minimal vent settings  Interim History / Subjective:  Shock improving, now off Levophed. Remains on CRRT. On minimal vent settings. Not following commands yet but still working down on sedation. E coli in blood and urine is pan-sensitive.  Objective   Blood pressure (!) 135/59, pulse 60, temperature 97.8 F (36.6 C), temperature source Axillary, resp. rate (!) 29, height _0  (1.651 m), weight (!) 154.9 kg, SpO2 100 %. CVP:  [8 mmHg-16 mmHg] 14 mmHg  FiO2 (%):  [28 %] 28 %   Intake/Output Summary (Last 24 hours) at 08/31/2021 1106 Last data filed at 08/31/2021 0800 Gross per 24 hour  Intake 677.65 ml  Output 450 ml  Net 227.65 ml    Filed Weights   08/29/21 1000 08/30/21 0423 08/31/21 0500  Weight: (!) 154.3 kg (!) 155.5 kg (!) 154.9 kg    Examination: General: Caucasian female in NAD, intubated and sedated Lungs: decreased at the bases bilaterally, otherwise clear Cardiovascular: Regular rate and rhythm, S1-S2, no murmurs, rubs, gallops Abdomen:  Morbidly obese, soft, nontender, nondistended, no guarding rebound tenderness, distant bowel sounds Extremities: No deformities, chronic venous trophic changes to BLE  Resolved Hospital Problem list     Assessment & Plan:   Septic Shock 2/2 E coli pyelonephritis with bacteremia Bilateral ureteral stones with severe left hydronephrosis s/p b/l ureterals stent placement (08/25/21) -Wean pressors as able; off vasopressin, cont Levo -MAP goal >65 -Stress dose steroids until off pressors -Continue ceftriaxone 2 g daily for planned 7 day course  Chronic HFpEF Demand ischemia Paroxsymal Atrial Fibrillation PMHx: Severe aortic stenosis s/p TAVR, CAD, MI, Hypertension, Hyperlipidemia -Continuous cardiac monitoring -Maintain MAP >65 as above -Vasopressors as needed to maintain MAP goal -EF down to 45-50% from 50-55% prior in setting of acute infection -Optimize sepsis and ensure GDMT with Cardiology follow up -Heparin gtt for Afib  Acute Hypoxic Respiratory Failure in the setting of septic shock, multiple metabolic derangements PMHx: OSA -Full vent support, implement lung protective strategies -Plateau pressures less than 30 cm H20 -Wean FiO2 & PEEP as tolerated to maintain O2 sats >92% -Follow intermittent Chest X-ray & ABG as needed -Spontaneous Breathing Trials when respiratory parameters met and mental status permits -Implement VAP Bundle -Prn Bronchodilators  Acute Kidney Injury Anion Gap Metabolic Acidosis in the setting of Lactic Acidosis and AKI -Nephrology consulted -Continue CRRT; goal UF 0 -Avoid nephrotoxins -Renally dose medications -Strict I/O; monitor UOP  Mild Thrombocytopenia, likely in setting of septic shock -Monitor for S/Sx of bleeding -Trend CBC -Heparin as above -Transfuse for Hgb <7  Acute Metabolic Encephalopathy Sedation needs in the setting of mechanical ventilation -  Maintain a RASS goal of 0 to -1 -Daily wake up assessment, SBT today -Treat sepsis  and metabolic derangements as above  Diabetes Mellitus -CBG's q4h; Target range of 140 to 180 -SSI -Follow ICU Hypo/Hyperglycemia protocol   Best Practice (right click and "Reselect all SmartList Selections" daily)   Diet/type: NPO; tube feeds DVT prophylaxis: systemic heparin GI prophylaxis: PPI Lines: Central line, Dialysis Catheter, and yes and it is still needed Foley:  Yes, and it is still needed Code Status:  full code Last date of multidisciplinary goals of care discussion [08/26/21]  Labs   CBC: Recent Labs  Lab 08/27/21 0415 08/28/21 0453 08/29/21 0341 08/30/21 0419 08/31/21 0446  WBC 20.1* 16.1* 10.6* 11.7* 16.0*  HGB 11.5* 11.0* 11.1* 10.8* 11.1*  HCT 36.7 35.7* 35.1* 34.3* 35.0*  MCV 93.9 93.5 91.2 91.7 91.4  PLT 101* 94* 75* 81* 91*     Basic Metabolic Panel: Recent Labs  Lab 08/27/21 0415 08/27/21 1600 08/28/21 0453 08/28/21 1601 08/29/21 0341 08/29/21 1145 08/29/21 1544 08/30/21 0419 08/31/21 0446  NA 137   < > 134* 137 136  --  136 138 139  K 3.9   < > 3.2* 3.6 3.9  --  3.9 3.9 3.7  CL 104   < > 101 103 101  --  103 103 103  CO2 24   < > _0 --  _1 GLUCOSE 165*   < > 196* 177* 224*  --  203* 218* 195*  BUN 41*   < > 26* 26* 26*  --  29* 40* 54*  CREATININE 2.66*   < > 1.37* 1.20* 1.17*  --  1.21* 1.86* 2.06*  CALCIUM 8.3*   < > 9.6 8.4* 8.3*  --  8.2* 8.3* 8.1*  MG 1.7  --  1.9  --  2.4  --   --  2.3 2.4  PHOS 3.6   < > 2.4* 1.7* 1.7* 2.5 2.1* 1.8* 3.4   < > = values in this interval not displayed.    GFR: Estimated Creatinine Clearance: 38.6 mL/min (A) (by C-G formula based on SCr of 2.06 mg/dL (H)). Recent Labs  Lab 08/25/21 1939 08/25/21 2111 08/26/21 0330 08/26/21 1310 08/27/21 0415 08/27/21 0800 08/27/21 1456 08/28/21 0453 08/29/21 0341 08/30/21 0419 08/31/21 0446  PROCALCITON 124.35  --  >150.00  --  79.09  --   --   --   --   --   --   WBC  --    < > 27.7*  --  20.1*  --   --  16.1* 10.6* 11.7* 16.0*   LATICACIDVEN  --    < > 3.2* 3.9*  --  2.8* 2.2*  --   --   --   --    < > = values in this interval not displayed.     Liver Function Tests: Recent Labs  Lab 08/25/21 1128 08/26/21 1600 08/28/21 1601 08/29/21 0341 08/29/21 1544 08/30/21 0419 08/31/21 0446  AST 72*  --   --   --   --   --   --   ALT 36  --   --   --   --   --   --   ALKPHOS 45  --   --   --   --   --   --   BILITOT 0.9  --   --   --   --   --   --  PROT 5.7*  --   --   --   --   --   --   ALBUMIN 2.9*   < > 2.4* 2.5* 2.6* 2.4* 2.2*   < > = values in this interval not displayed.    No results for input(s): LIPASE, AMYLASE in the last 168 hours. No results for input(s): AMMONIA in the last 168 hours.  ABG    Component Value Date/Time   PHART 7.47 (H) 08/31/2021 0839   PCO2ART 33 08/31/2021 0839   PO2ART 116 (H) 08/31/2021 0839   HCO3 24.0 08/31/2021 0839   ACIDBASEDEF 0.2 08/29/2021 1627   O2SAT 99.1 08/31/2021 0839      Coagulation Profile: Recent Labs  Lab 08/25/21 1128 08/26/21 0330 08/27/21 0415  INR 1.6* 1.9* 1.8*     Cardiac Enzymes: No results for input(s): CKTOTAL, CKMB, CKMBINDEX, TROPONINI in the last 168 hours.  HbA1C: Hgb A1c MFr Bld  Date/Time Value Ref Range Status  08/25/2021 09:11 PM 7.6 (H) 4.8 - 5.6 % Final    Comment:    (NOTE)         Prediabetes: 5.7 - 6.4         Diabetes: >6.4         Glycemic control for adults with diabetes: <7.0   07/12/2017 01:09 AM 6.4 (H) 4.8 - 5.6 % Final    Comment:    (NOTE) Pre diabetes:          5.7%-6.4% Diabetes:              >6.4% Glycemic control for   <7.0% adults with diabetes     CBG: Recent Labs  Lab 08/30/21 1926 08/30/21 2330 08/30/21 2336 08/31/21 0441 08/31/21 0724  GLUCAP 152* 182* 191* 184* 164*     Review of Systems:   Unable to assess due to intubation and sedation   Past Medical History:  She,  has a past medical history of Bacteremia, CAD (coronary artery disease), Diabetes mellitus without  complication ([place]), Diastolic CHF ([place]), Hyperlipemia, Hypertension, Lymphedema, MI (myocardial infarction) ([place]), OSA (obstructive sleep apnea), and Severe aortic stenosis.   Surgical History:   Past Surgical History:  Procedure Laterality Date   CARDIAC SURGERY     CYSTOSCOPY W/ URETERAL STENT PLACEMENT  08/25/2021   Procedure: CYSTOSCOPY WITH RETROGRADE PYELOGRAM/URETERAL STENT PLACEMENT;  Surgeon: Billey Co, MD;  Location: [facility] ORS;  Service: Urology;;   none     PACEMAKER INSERTION       Social History:   reports that she has never smoked. She has never used smokeless tobacco. She reports that she does not drink alcohol and does not use drugs.   Family History:  Her family history includes Cancer in her mother; Heart failure in her father.   Allergies Allergies  Allergen Reactions   Tetanus Toxoid, Adsorbed Swelling   Tetanus Toxoid     Other reaction(s): Unknown   Tetanus Toxoids Swelling   Lisinopril Rash    Other reaction(s): Unknown   Niacin Rash    Other reaction(s): Unknown   Sitagliptin Rash    Other reaction(s): Unknown   Sulfamethoxazole-Trimethoprim Rash    Other reaction(s): Unknown     Home Medications  Prior to Admission medications   Medication Sig Start Date End Date Taking? Authorizing Provider  acetaminophen (TYLENOL) 325 MG tablet Take 2 tablets (650 mg total) by mouth every 6 (six) hours as needed for mild pain (or Fever >/= 101). 11/05/17  Yes Loletha Grayer, MD  amLODipine (NORVASC) 5 MG tablet Take 5 mg by mouth daily. 08/09/21  Yes [provider]  apixaban (ELIQUIS) 5 MG TABS tablet Take 1 tablet by mouth 2 (two) times daily. 08/10/17  Yes [provider]  atorvastatin (LIPITOR) 40 MG tablet Take 1 tablet (40 mg total) by mouth daily at 6 PM. 05/09/16  Yes Epifanio Lesches, MD  cefpodoxime (VANTIN) 100 MG tablet Take 1 tablet (100 mg total) by mouth 2 (two) times daily. 11/29/20  Yes Billey Co, MD  Cholecalciferol  50 MCG (2000 UT) CAPS Take 1 capsule by mouth daily.   Yes [provider]  docusate sodium (COLACE) 100 MG capsule Take 1 capsule (100 mg total) by mouth daily. 11/05/17  Yes Wieting, Richard, MD  fenofibrate (TRICOR) 145 MG tablet Take 1 tablet by mouth daily. 04/17/16  Yes [provider]  furosemide (LASIX) 40 MG tablet Take 1 tablet (40 mg total) by mouth daily. 07/17/16  Yes Vaughan Basta, MD  insulin aspart (NOVOLOG FLEXPEN) 100 UNIT/ML FlexPen Inject 30 Units into the skin daily. 10/28/19  Yes [provider]  Insulin Glargine (BASAGLAR KWIKPEN) 100 UNIT/ML Inject 70 Units into the skin daily. 04/23/17  Yes [provider]  metFORMIN (GLUCOPHAGE) 1000 MG tablet Take 1 tablet (1,000 mg total) by mouth daily with breakfast. 05/22/17  Yes Max Sane, MD  metoprolol tartrate (LOPRESSOR) 25 MG tablet Take 1 tablet (25 mg total) by mouth 2 (two) times daily. 05/22/17  Yes Max Sane, MD  Potassium Chloride ER 20 MEQ TBCR Take 1 tablet by mouth daily. 02/09/20  Yes [provider]  amiodarone (PACERONE) 200 MG tablet Take 1 tablet (200 mg total) by mouth daily. Patient not taking: Reported on 08/25/2021 11/06/17   Loletha Grayer, MD  BD PEN NEEDLE NANO 2ND GEN 32G X 4 MM MISC 4 (four) times daily. use as directed 02/09/20   [provider]  tamsulosin (FLOMAX) 0.4 MG CAPS capsule Take 1 capsule (0.4 mg total) by mouth daily. Patient not taking: Reported on 08/25/2021 02/05/20   Billey Co, MD     Critical care provider statement:   Total critical care time: 33 minutes   Performed by: Lanney Gins MD   Critical care time was exclusive of separately billable procedures and treating other patients.   Critical care was necessary to treat or prevent imminent or life-threatening deterioration.   Critical care was time spent personally by me on the following activities: development of treatment plan with patient and/or surrogate as well as  nursing, discussions with consultants, evaluation of patient's response to treatment, examination of patient, obtaining history from patient or surrogate, ordering and performing treatments and interventions, ordering and review of laboratory studies, ordering and review of radiographic studies, pulse oximetry and re-evaluation of patient's condition.    Ottie Glazier, M.D.  Pulmonary & Critical Care Medicine

## 2021-08-31 NOTE — Progress Notes (Signed)
[place], [place] 08/31/21  Subjective:   Hospital day # 6   cvs: paced rhythm, off pressors pulm: Patient was extubated yesterday.  She is now on BiPAP.  She appears tachypneic. Somnolent this morning. Renal: 03/07 0701 - 03/08 0700 In: 661.7 [I.V.:561.7; IV Piggyback:100] Out: 450 [Urine:450] Lab Results  Component Value Date   CREATININE 2.06 (H) 08/31/2021   CREATININE 1.86 (H) 08/30/2021   CREATININE 1.21 (H) 08/29/2021   Foley in place very small amount of urine noted. Getting heparin drip  Objective:  Vital signs in last 24 hours:  Temp:  [98 F (36.7 C)-98.6 F (37 C)] 98 F (36.7 C) (03/08 0400) Pulse Rate:  [40-70] 59 (03/08 0600) Resp:  [18-35] 29 (03/08 0000) BP: (130-148)/(57-97) 145/71 (03/08 0600) SpO2:  [98 %-100 %] 100 % (03/08 0600) FiO2 (%):  [28 %] 28 % (03/08 0400) Weight:  [154.9 kg] 154.9 kg (03/08 0500)  Weight change: 0.6 kg Filed Weights   08/29/21 1000 08/30/21 0423 08/31/21 0500  Weight: (!) 154.3 kg (!) 155.5 kg (!) 154.9 kg    Intake/Output:    Intake/Output Summary (Last 24 hours) at 08/31/2021 0801 Last data filed at 08/31/2021 0600 Gross per 24 hour  Intake 661.65 ml  Output 450 ml  Net 211.65 ml      Physical Exam: General: Obese lady, critically ill, laying in the bed  HEENT Positive pressure ventilation mask in place  Pulm/lungs Coarse crackles, BiPAP  CVS/Heart Irregular, paced rhythm  Abdomen:  Soft  Extremities: Some dependent edema  Neurologic: Very somnolent.  Did not wake up with verbal and tactile stimulation  Skin: No acute rashes  Access: Left IJ temporary dialysis catheter   Foley in place with small amount of dark urine    Basic Metabolic Panel:  Recent Labs  Lab 08/27/21 0415 08/27/21 1600 08/28/21 0453 08/28/21 1601 08/29/21 0341 08/29/21 1145 08/29/21 1544 08/30/21 0419 08/31/21 0446  NA 137   < > 134* 137 136  --  136 138 139  K 3.9   < > 3.2* 3.6 3.9  --   3.9 3.9 3.7  CL 104   < > 101 103 101  --  103 103 103  CO2 24   < > 25 25 25   --  25 24 27   GLUCOSE 165*   < > 196* 177* 224*  --  203* 218* 195*  BUN 41*   < > 26* 26* 26*  --  29* 40* 54*  CREATININE 2.66*   < > 1.37* 1.20* 1.17*  --  1.21* 1.86* 2.06*  CALCIUM 8.3*   < > 9.6 8.4* 8.3*  --  8.2* 8.3* 8.1*  MG 1.7  --  1.9  --  2.4  --   --  2.3 2.4  PHOS 3.6   < > 2.4* 1.7* 1.7* 2.5 2.1* 1.8* 3.4   < > = values in this interval not displayed.      CBC: Recent Labs  Lab 08/27/21 0415 08/28/21 0453 08/29/21 0341 08/30/21 0419 08/31/21 0446  WBC 20.1* 16.1* 10.6* 11.7* 16.0*  HGB 11.5* 11.0* 11.1* 10.8* 11.1*  HCT 36.7 35.7* 35.1* 34.3* 35.0*  MCV 93.9 93.5 91.2 91.7 91.4  PLT 101* 94* 75* 81* 91*      No results found for: HEPBSAG, HEPBSAB, HEPBIGM    Microbiology:  Recent Results (from the past 240 hour(s))  Blood Culture (routine x 2)     Status: Abnormal   Collection  Time: 08/25/21 12:29 PM   Specimen: BLOOD RIGHT HAND  Result Value Ref Range Status   Specimen Description   Final    BLOOD RIGHT HAND Performed at [facility], [address] [place], [place] [address]    Special Requests   Final    BOTTLES DRAWN AEROBIC AND ANAEROBIC Blood Culture results may not be optimal due to an inadequate volume of blood received in culture bottles Performed at [facility], [address], [place], [place] [address]    Culture  Setup Time   Final    GRAM NEGATIVE RODS IN BOTH AEROBIC AND ANAEROBIC BOTTLES CRITICAL RESULT CALLED TO, READ BACK BY AND VERIFIED WITH: MORGAN HICKS AT 2207 ON 08/25/21 BY SS Performed at [facility], [place] [address], [place], [place] [address]    Culture ESCHERICHIA COLI (A)  Final   Report Status 08/28/2021 FINAL  Final   Organism ID, Bacteria ESCHERICHIA COLI  Final      Susceptibility   Escherichia coli - MIC*    AMPICILLIN 8 SENSITIVE Sensitive     CEFAZOLIN <=4 SENSITIVE Sensitive     CEFEPIME <=0.12  SENSITIVE Sensitive     CEFTAZIDIME <=1 SENSITIVE Sensitive     CEFTRIAXONE <=0.25 SENSITIVE Sensitive     CIPROFLOXACIN <=0.25 SENSITIVE Sensitive     GENTAMICIN <=1 SENSITIVE Sensitive     IMIPENEM <=0.25 SENSITIVE Sensitive     TRIMETH/SULFA <=20 SENSITIVE Sensitive     AMPICILLIN/SULBACTAM <=2 SENSITIVE Sensitive     PIP/TAZO <=4 SENSITIVE Sensitive     * ESCHERICHIA COLI  Blood Culture ID Panel (Reflexed)     Status: Abnormal   Collection Time: 08/25/21 12:29 PM  Result Value Ref Range Status   Enterococcus faecalis NOT DETECTED NOT DETECTED Final   Enterococcus Faecium NOT DETECTED NOT DETECTED Final   Listeria monocytogenes NOT DETECTED NOT DETECTED Final   Staphylococcus species NOT DETECTED NOT DETECTED Final   Staphylococcus aureus (BCID) NOT DETECTED NOT DETECTED Final   Staphylococcus epidermidis NOT DETECTED NOT DETECTED Final   Staphylococcus lugdunensis NOT DETECTED NOT DETECTED Final   Streptococcus species NOT DETECTED NOT DETECTED Final   Streptococcus agalactiae NOT DETECTED NOT DETECTED Final   Streptococcus pneumoniae NOT DETECTED NOT DETECTED Final   Streptococcus pyogenes NOT DETECTED NOT DETECTED Final   A.calcoaceticus-baumannii NOT DETECTED NOT DETECTED Final   Bacteroides fragilis NOT DETECTED NOT DETECTED Final   Enterobacterales DETECTED (A) NOT DETECTED Final    Comment: Enterobacterales represent a large order of gram negative bacteria, not a single organism. CRITICAL RESULT CALLED TO, READ BACK BY AND VERIFIED WITH: MORGAN HICKS AT 2207 ON 08/25/21 BY SS    Enterobacter cloacae complex NOT DETECTED NOT DETECTED Final   Escherichia coli DETECTED (A) NOT DETECTED Final    Comment: CRITICAL RESULT CALLED TO, READ BACK BY AND VERIFIED WITH: MORGAN HICKS AT 2207 ON 08/25/21 BY SS    Klebsiella aerogenes NOT DETECTED NOT DETECTED Final   Klebsiella oxytoca NOT DETECTED NOT DETECTED Final   Klebsiella pneumoniae NOT DETECTED NOT DETECTED Final   Proteus  species NOT DETECTED NOT DETECTED Final   Salmonella species NOT DETECTED NOT DETECTED Final   Serratia marcescens NOT DETECTED NOT DETECTED Final   Haemophilus influenzae NOT DETECTED NOT DETECTED Final   Neisseria meningitidis NOT DETECTED NOT DETECTED Final   Pseudomonas aeruginosa NOT DETECTED NOT DETECTED Final   Stenotrophomonas maltophilia NOT DETECTED NOT DETECTED Final   Candida albicans NOT DETECTED NOT DETECTED  Final   Candida auris NOT DETECTED NOT DETECTED Final   Candida glabrata NOT DETECTED NOT DETECTED Final   Candida krusei NOT DETECTED NOT DETECTED Final   Candida parapsilosis NOT DETECTED NOT DETECTED Final   Candida tropicalis NOT DETECTED NOT DETECTED Final   Cryptococcus neoformans/gattii NOT DETECTED NOT DETECTED Final   CTX-M ESBL NOT DETECTED NOT DETECTED Final   Carbapenem resistance IMP NOT DETECTED NOT DETECTED Final   Carbapenem resistance KPC NOT DETECTED NOT DETECTED Final   Carbapenem resistance NDM NOT DETECTED NOT DETECTED Final   Carbapenem resist OXA 48 LIKE NOT DETECTED NOT DETECTED Final   Carbapenem resistance VIM NOT DETECTED NOT DETECTED Final    Comment: Performed at [facility], [address] [place], [place] [address]  Urine Culture     Status: Abnormal   Collection Time: 08/25/21  1:47 PM   Specimen: In/Out Cath Urine  Result Value Ref Range Status   Specimen Description   Final    IN/OUT CATH URINE Performed at [facility], [address] [place], [place] [address]    Special Requests   Final    NONE Performed at [facility], [address] [place], [place] [address]    Culture >=100,000 COLONIES/mL ESCHERICHIA COLI (A)  Final   Report Status 08/28/2021 FINAL  Final   Organism ID, Bacteria ESCHERICHIA COLI (A)  Final      Susceptibility   Escherichia coli - MIC*    AMPICILLIN 8 SENSITIVE Sensitive     CEFAZOLIN <=4 SENSITIVE Sensitive     CEFEPIME <=0.12 SENSITIVE Sensitive     CEFTRIAXONE  <=0.25 SENSITIVE Sensitive     CIPROFLOXACIN <=0.25 SENSITIVE Sensitive     GENTAMICIN <=1 SENSITIVE Sensitive     IMIPENEM <=0.25 SENSITIVE Sensitive     NITROFURANTOIN <=16 SENSITIVE Sensitive     TRIMETH/SULFA <=20 SENSITIVE Sensitive     AMPICILLIN/SULBACTAM <=2 SENSITIVE Sensitive     PIP/TAZO <=4 SENSITIVE Sensitive     * >=100,000 COLONIES/mL ESCHERICHIA COLI  Resp Panel by RT-PCR (Flu A&B, Covid) Nasopharyngeal Swab     Status: None   Collection Time: 08/25/21  3:05 PM   Specimen: Nasopharyngeal Swab; Nasopharyngeal(NP) swabs in vial transport medium  Result Value Ref Range Status   SARS Coronavirus 2 by RT PCR NEGATIVE NEGATIVE Final    Comment: (NOTE) SARS-CoV-2 target nucleic acids are NOT DETECTED.  The SARS-CoV-2 RNA is generally detectable in upper respiratory specimens during the acute phase of infection. The lowest concentration of SARS-CoV-2 viral copies this assay can detect is 138 copies/mL. A negative result does not preclude SARS-Cov-2 infection and should not be used as the sole basis for treatment or other patient management decisions. A negative result may occur with  improper specimen collection/handling, submission of specimen other than nasopharyngeal swab, presence of viral mutation(s) within the areas targeted by this assay, and inadequate number of viral copies(<138 copies/mL). A negative result must be combined with clinical observations, patient history, and epidemiological information. The expected result is Negative.  Fact Sheet for Patients:  EntrepreneurPulse.com.au  Fact Sheet for Healthcare Providers:  IncredibleEmployment.be  This test is no t yet approved or cleared by the [place] FDA and  has been authorized for detection and/or diagnosis of SARS-CoV-2 by FDA under an Emergency Use Authorization (EUA). This EUA will remain  in effect (meaning this test can be used) for the duration of  the COVID-19 declaration under Section 564(b)(1) of the Act, 21 U.S.C.section 360bbb-3(b)(1),  unless the authorization is terminated  or revoked sooner.       Influenza A by PCR NEGATIVE NEGATIVE Final   Influenza B by PCR NEGATIVE NEGATIVE Final    Comment: (NOTE) The Xpert Xpress SARS-CoV-2/FLU/RSV plus assay is intended as an aid in the diagnosis of influenza from Nasopharyngeal swab specimens and should not be used as a sole basis for treatment. Nasal washings and aspirates are unacceptable for Xpert Xpress SARS-CoV-2/FLU/RSV testing.  Fact Sheet for Patients: EntrepreneurPulse.com.au  Fact Sheet for Healthcare Providers: IncredibleEmployment.be  This test is not yet approved or cleared by the [place] FDA and has been authorized for detection and/or diagnosis of SARS-CoV-2 by FDA under an Emergency Use Authorization (EUA). This EUA will remain in effect (meaning this test can be used) for the duration of the COVID-19 declaration under Section 564(b)(1) of the Act, 21 U.S.C. section 360bbb-3(b)(1), unless the authorization is terminated or revoked.  Performed at [facility], [address] [place], [place] [address]   MRSA Next Gen by PCR, Nasal     Status: None   Collection Time: 08/25/21  6:44 PM   Specimen: Nasal Mucosa; Nasal Swab  Result Value Ref Range Status   MRSA by PCR Next Gen NOT DETECTED NOT DETECTED Final    Comment: (NOTE) The GeneXpert MRSA Assay (FDA approved for NASAL specimens only), is one component of a comprehensive MRSA colonization surveillance program. It is not intended to diagnose MRSA infection nor to guide or monitor treatment for MRSA infections. Test performance is not FDA approved in patients less than 69 years old. Performed at [facility], [address] [place], [place] [address]   Blood Culture (routine x 2)     Status: None   Collection Time: 08/25/21  7:38 PM    Specimen: BLOOD  Result Value Ref Range Status   Specimen Description BLOOD RIGHT HAND  Final   Special Requests   Final    BOTTLES DRAWN AEROBIC AND ANAEROBIC Blood Culture adequate volume   Culture   Final    NO GROWTH 5 DAYS Performed at [facility], [address] [place], [place] [address]    Report Status 08/30/2021 FINAL  Final  Culture, Respiratory w Gram Stain     Status: None   Collection Time: 08/26/21  8:21 AM   Specimen: Tracheal Aspirate; Respiratory  Result Value Ref Range Status   Specimen Description   Final    TRACHEAL ASPIRATE Performed at [facility], [address] [place], [place] [address]    Special Requests   Final    NONE Performed at [facility], [address] [place], [place] [address]    Gram Stain   Final    RARE SQUAMOUS EPITHELIAL CELLS PRESENT RARE WBC PRESENT, PREDOMINANTLY MONONUCLEAR NO ORGANISMS SEEN    Culture   Final    RARE Normal respiratory flora-no Staph aureus or Pseudomonas seen Performed at [facility], [address], [place], [place] [address]    Report Status 08/29/2021 FINAL  Final    Coagulation Studies: No results for input(s): LABPROT, INR in the last 72 hours.   Urinalysis: No results for input(s): COLORURINE, LABSPEC, PHURINE, GLUCOSEU, HGBUR, BILIRUBINUR, KETONESUR, PROTEINUR, UROBILINOGEN, NITRITE, LEUKOCYTESUR in the last 72 hours.  Invalid input(s): APPERANCEUR    Imaging: DG Chest Port 1 View  Result Date: 08/30/2021 CLINICAL DATA:  Acute respiratory failure EXAM: PORTABLE CHEST 1 VIEW COMPARISON:  08/26/2021 FINDINGS: Bilateral interstitial and alveolar  airspace opacities most severe at the right lung base concerning for multilobar pneumonia versus pulmonary edema. No pleural effusion or pneumothorax. Stable cardiomegaly. Prior TAVR. Dual lead cardiac pacemaker. Right jugular central venous catheter with the tip projecting over the cavoatrial junction. Left jugular  central venous catheter with the tip projecting over the SVC. No acute osseous abnormality IMPRESSION: 1. Bilateral interstitial and alveolar airspace opacities most severe at the right lung base concerning for multilobar pneumonia versus pulmonary edema. 2. Stable cardiomegaly. Electronically Signed   By: Kathreen Devoid M.D.   On: 08/30/2021 09:12     Medications:    sodium chloride 10 mL/hr at 08/31/21 0600   cefTRIAXone (ROCEPHIN)  IV Stopped (08/30/21 2158)   heparin 1,800 Units/hr (08/31/21 0600)    chlorhexidine gluconate (MEDLINE KIT)  15 mL Mouth Rinse BID   Chlorhexidine Gluconate Cloth  6 each Topical Q0600   docusate  100 mg Per Tube BID   insulin aspart  0-20 Units Subcutaneous Q4H   insulin glargine-yfgn  20 Units Subcutaneous Daily   mouth rinse  15 mL Mouth Rinse 10 times per day   pantoprazole (PROTONIX) IV  40 mg Intravenous Daily   polyethylene glycol  17 g Per Tube Daily   sodium chloride flush  10-40 mL Intracatheter Q12H   sodium chloride, acetaminophen, heparin sodium (porcine), ipratropium-albuterol, midazolam, ondansetron **OR** ondansetron (ZOFRAN) IV, sodium chloride flush  Assessment/ Plan:  71 y.o. female with  morbid obesity (BMI 57), DM, CAD, OSA, HTN, Kidney stones, aortic stenosis with h/o TAVR   admitted on 01/26/3382 for Renal colic [A91] Sepsis ([place]) [A41.9] Urinary tract infection with hematuria, site unspecified [N39.0, R31.9] Community acquired pneumonia, unspecified laterality [J18.9] Sepsis, due to unspecified organism, unspecified whether acute organ dysfunction present ([place]) [A41.9]   # AKI on St 3b AKI likely secondary to sepsis Baseline creatinine of 1.3 from 2019.  Admit creatinine of 2.31 that peaked at 4.47  03/07 0701 - 03/08 0700 In: 661.7 [I.V.:561.7; IV Piggyback:100] Out: 450 [Urine:450]    CRRT was discontinued on March 6. Electrolytes are acceptable.   However patient appears to have volume overload requiring BiPAP although  volume status is very difficult to determine.  She does have significant generalized edema. We will arrange for UF only treatment with IV albumin supplementation for oncotic support.  #Acute respiratory failure Extubated on March 6 Currently requiring NIPPV  # Sepsis with UTI, hypotension, shock E Coli Pyelonephritis and bacteremia # Nephrolithiasis with rt proximal stone with hydronephrosis and left UPJ stone without hydronephrosis. On 3/2, patient underwent cystoscopy with bilateral ureteral stents placed by Dr. Diamantina Providence. Patient was found to have purulent urine in bladder. Plan for outpatient b/l uretereroscopy/laser lithotripsy in 1-2 months    LOS: [place] 3/8/20238:01 [place], [place]  Note: This note was prepared with Dragon dictation. Any transcription errors are unintentional

## 2021-08-31 NOTE — Consult Note (Signed)
ANTICOAGULATION CONSULT NOTE ? ?Pharmacy Consult for Heparin gtt (PTA Eliquis) ?Indication: atrial fibrillation ? ?Patient Measurements: ?Height: 5\' 5"  (165.1 cm) ?Weight: (!) 154.9 kg (341 lb 7.9 oz) ?IBW/kg (Calculated) : 57 ?Heparin Dosing Weight: 96.5kg ? ?Vital Signs: ?Temp: 98 ?F (36.7 ?C) (03/08 0400) ?Temp Source: Axillary (03/08 0400) ?BP: 145/71 (03/08 0600) ?Pulse Rate: 59 (03/08 0600) ? ?Labs: ?Recent Labs  ?  08/29/21 ?0341 08/29/21 ?1544 08/30/21 ?0015 08/30/21 ?4742 08/31/21 ?0446  ?HGB 11.1*  --   --  10.8* 11.1*  ?HCT 35.1*  --   --  34.3* 35.0*  ?PLT 75*  --   --  81* 91*  ?APTT 71*  --  59* 61*  --   ?HEPARINUNFRC 0.71*  --  0.43 0.44 0.41  ?CREATININE 1.17* 1.21*  --  1.86* 2.06*  ? ? ? ?Estimated Creatinine Clearance: 38.6 mL/min (A) (by C-G formula based on SCr of 2.06 mg/dL (H)). ? ? ?Medical History: ?Past Medical History:  ?Diagnosis Date  ? Bacteremia   ? CAD (coronary artery disease)   ? Diabetes mellitus without complication ([place])   ? Diastolic CHF ([place])   ? Hyperlipemia   ? Hypertension   ? Lymphedema   ? MI (myocardial infarction) ([place])   ? OSA (obstructive sleep apnea)   ? Severe aortic stenosis   ? ? ?Medications: No AC/APT pertinent drug allergies ?Heparin Dosing Weight: 96.5kg ?PTA: Apixaban 5mg  BID (last dose on 3/02 AM PTA >> admitted on 3/02 - 1344) ?Inpatient: Apixaban (stopped w/o receiving any doses) >> Heparin gtt. ? ?Assessment: ?71yo F w/ h/o  HFpEF, severe AS (s/p TAVR), morbid obesity (BMI 57), obesity hypoventilation syndrome, OSA, DM, CAD, & HTN who presented to [facility] ED on 08/25/2021 due to complaints of altered mental 2/2 severe urosepsis and septic shock ISO Rt ureteral stone, AKI, ac metabolic enceph. Pharmacy consulted for conversion of DOAC to Heparin gtt. Since admission her platelets have been trending down from a low baseline. A decision was made to continue heparin but lower the therapeutic goal range ?   ? ?Goal of Therapy:  ?Heparin level 0.3-0.5 units/ml ?aPTT  66-85 seconds ?Monitor platelets by anticoagulation protocol: Yes ? ?3/7 0015 aPTT 59, subtherapeutic, HL 0.43 therapeutic ?3/7 0419 HL 0.44 therapeutic, aPTT 61 ?3/8 0446 HL 0.41, therapeutic x 3  ?  ?Plan:  ?Continue heparin infusion rate at 1800 units/hr. ?Recheck HL daily w/ AM labs while therapeutic  ?Daily CBC while on IV heparin ? ?Renda Rolls, PharmD, MBA ?08/31/2021 ?6:41 AM ? ? ? ? ? ? ?

## 2021-08-31 NOTE — Plan of Care (Signed)
Neuro: moaning/incomprehensible sounds to pain/voice, very weak attempts to follow commands (intermittent), total assist with movement in bed ?Resp: tolerating nasal cannula this afternoon, recommend for bipap overnight, having difficulty coughing/clearing mucous, continue to encourage strong coughing/oral suctioning/oral care ?CV: afebrile, vital signs stable, edema present throughout ?GIGU: foley in place-strained urine for possible stone passed-sent to lab for analysis, HD to be done today, no BM, NPO ?Skin: right open skin tear found on right upper, medial back-foam placed, blisters bilateral posterior, upper, medial thighs-foam placed, scattered bruising, see flowsheets  ?Social: Spoke with cousin this AM with Dr. Candiss Norse to obtain consent for hemodialysis, all questions and concerns addressed ? ?Problem: Education: ?Goal: Knowledge of General Education information will improve ?Description: Including pain rating scale, medication(s)/side effects and non-pharmacologic comfort measures ?Outcome: Not Progressing ?  ?Problem: Health Behavior/Discharge Planning: ?Goal: Ability to manage health-related needs will improve ?Outcome: Not Progressing ?  ?Problem: Clinical Measurements: ?Goal: Ability to maintain clinical measurements within normal limits will improve ?Outcome: Not Progressing ?Goal: Will remain free from infection ?Outcome: Not Progressing ?Goal: Diagnostic test results will improve ?Outcome: Not Progressing ?Goal: Respiratory complications will improve ?Outcome: Not Progressing ?Goal: Cardiovascular complication will be avoided ?Outcome: Not Progressing ?  ?Problem: Activity: ?Goal: Risk for activity intolerance will decrease ?Outcome: Not Progressing ?  ?Problem: Coping: ?Goal: Level of anxiety will decrease ?Outcome: Not Progressing ?  ?Problem: Elimination: ?Goal: Will not experience complications related to bowel motility ?Outcome: Not Progressing ?Goal: Will not experience complications related to  urinary retention ?Outcome: Not Progressing ?  ?Problem: Pain Managment: ?Goal: General experience of comfort will improve ?Outcome: Not Progressing ?  ?Problem: Safety: ?Goal: Ability to remain free from injury will improve ?Outcome: Not Progressing ?  ?Problem: Skin Integrity: ?Goal: Risk for impaired skin integrity will decrease ?Outcome: Not Progressing ?  ?

## 2021-08-31 NOTE — Progress Notes (Signed)
Hemodialysis notes. ? ?21:32 HD started.  ?21:36 machine alarming TMP low. Machine checked. Lines checked. No kinks. Machine keeps on alarming. Blood reached transducer. Blood returned to prevent clotting on lines.  ?DR Candiss Norse was notified with order to discontinue treatment and will try again tomorrow. HD then terminated completely/ total fluid removed 24ml. ? ? ?

## 2021-08-31 NOTE — Consult Note (Signed)
PHARMACY CONSULT NOTE - FOLLOW UP ? ?Pharmacy Consult for Electrolyte Monitoring and Replacement  ? ?Recent Labs: ?Potassium (mmol/L)  ?Date Value  ?08/31/2021 3.7  ? ?Magnesium (mg/dL)  ?Date Value  ?08/31/2021 2.4  ? ?Calcium (mg/dL)  ?Date Value  ?08/31/2021 8.1 (L)  ? ?Albumin (g/dL)  ?Date Value  ?08/31/2021 2.2 (L)  ? ?Phosphorus (mg/dL)  ?Date Value  ?08/31/2021 3.4  ? ?Sodium (mmol/L)  ?Date Value  ?08/31/2021 139  ? ? ? ?Assessment: ?71 yo F w/ h/o HFpEF, Severe AS, CAD (AC-paced), HTN, BMI 57, DM, severe urosepsis with septic shock ISO Rt prox ureteral stone w/ hydronephrosis & large Lt renal stone c/b AKI, acute metabolic encephalopathy. S/p cytoscopy B/L ureteral stents on 3/02. Pharmacy consulted for the mgmt of electrolytes. ? ?Goal of Therapy:  ?Lytes WNL ? ?Plan:  ?Scr 1.17>1.86>2.06;  ?Current Lytes are stable WNL, +2.8L. Will CTM and replace as needed. ?CTM daily BMP, Mg, PHos ? ?Lorna Dibble ,PharmD ?Clinical Pharmacist ?08/31/2021 9:44 AM ? ?

## 2021-09-01 LAB — BLOOD GAS, ARTERIAL
Acid-Base Excess: 0.9 mmol/L (ref 0.0–2.0)
Bicarbonate: 24 mmol/L (ref 20.0–28.0)
Delivery systems: POSITIVE
O2 Saturation: 99.1 %
Patient temperature: 37
pCO2 arterial: 33 mmHg (ref 32–48)
pH, Arterial: 7.47 — ABNORMAL HIGH (ref 7.35–7.45)
pO2, Arterial: 116 mmHg — ABNORMAL HIGH (ref 83–108)

## 2021-09-01 LAB — CBC
HCT: 34.6 % — ABNORMAL LOW (ref 36.0–46.0)
Hemoglobin: 10.7 g/dL — ABNORMAL LOW (ref 12.0–15.0)
MCH: 28.8 pg (ref 26.0–34.0)
MCHC: 30.9 g/dL (ref 30.0–36.0)
MCV: 93.3 fL (ref 80.0–100.0)
Platelets: 95 10*3/uL — ABNORMAL LOW (ref 150–400)
RBC: 3.71 MIL/uL — ABNORMAL LOW (ref 3.87–5.11)
RDW: 15.6 % — ABNORMAL HIGH (ref 11.5–15.5)
WBC: 14.8 10*3/uL — ABNORMAL HIGH (ref 4.0–10.5)
nRBC: 0.3 % — ABNORMAL HIGH (ref 0.0–0.2)

## 2021-09-01 LAB — RENAL FUNCTION PANEL
Albumin: 2.2 g/dL — ABNORMAL LOW (ref 3.5–5.0)
Anion gap: 10 (ref 5–15)
BUN: 62 mg/dL — ABNORMAL HIGH (ref 8–23)
CO2: 26 mmol/L (ref 22–32)
Calcium: 7.8 mg/dL — ABNORMAL LOW (ref 8.9–10.3)
Chloride: 106 mmol/L (ref 98–111)
Creatinine, Ser: 2.16 mg/dL — ABNORMAL HIGH (ref 0.44–1.00)
GFR, Estimated: 24 mL/min — ABNORMAL LOW (ref >60)
Glucose, Bld: 154 mg/dL — ABNORMAL HIGH (ref 70–99)
Phosphorus: 3.7 mg/dL (ref 2.5–4.6)
Potassium: 3.7 mmol/L (ref 3.5–5.1)
Sodium: 142 mmol/L (ref 135–145)

## 2021-09-01 LAB — GLUCOSE, CAPILLARY
Glucose-Capillary: 153 mg/dL — ABNORMAL HIGH (ref 70–99)
Glucose-Capillary: 135 mg/dL — ABNORMAL HIGH (ref 70–99)

## 2021-09-01 LAB — HEPATITIS B SURFACE ANTIBODY, QUANTITATIVE: Hep B S AB Quant (Post): <3.1 m[IU]/mL — ABNORMAL LOW (ref >9.9)

## 2021-09-01 LAB — MAGNESIUM: Magnesium: 2.3 mg/dL (ref 1.7–2.4)

## 2021-09-01 LAB — HEPARIN LEVEL (UNFRACTIONATED): Heparin Unfractionated: 0.38 IU/mL (ref 0.30–0.70)

## 2021-09-01 MED ORDER — ALBUMIN HUMAN 25 % IV SOLN
INTRAVENOUS | Status: AC
  Administered 2021-09-01: 08:00:00 25 g via INTRAVENOUS
  Filled 2021-09-01: qty 100

## 2021-09-01 MED ORDER — ORAL CARE MOUTH RINSE
15.0000 mL | Freq: Two times a day (BID) | OROMUCOSAL | Status: DC
  Administered 2021-09-02 – 2021-09-05 (×7): 15 mL via OROMUCOSAL

## 2021-09-01 MED ORDER — HEPARIN SODIUM (PORCINE) 1000 UNIT/ML IJ SOLN
2800.0000 [IU] | INTRAMUSCULAR | Status: DC | PRN
  Filled 2021-09-01: qty 2.8

## 2021-09-01 MED ORDER — HEPARIN SODIUM (PORCINE) 1000 UNIT/ML IJ SOLN
INTRAMUSCULAR | Status: AC
  Administered 2021-09-01: 10:00:00 2800 [IU]
  Filled 2021-09-01: qty 10

## 2021-09-01 NOTE — Progress Notes (Signed)
[facility], [place] 09/01/21  Subjective:   Hospital day # 7   cvs: paced rhythm, off pressors pulm: She remains on BiPAP.  Somnolent this morning. Renal: 03/08 0701 - 03/09 0700 In: 781.7 [I.V.:681.7; IV Piggyback:100] Out: 2615 [Urine:3075] Lab Results  Component Value Date   CREATININE 2.16 (H) 09/01/2021   CREATININE 2.06 (H) 08/31/2021   CREATININE 1.86 (H) 08/30/2021   Foley in place very small amount of urine noted. Remains on heparin drip  Responded well to IV Lasix yesterday.  Urine output about 2600 cc UF only treatment this morning   HEMODIALYSIS FLOWSHEET:  Blood Flow Rate (mL/min): 250 mL/min Arterial Pressure (mmHg): -100 mmHg Venous Pressure (mmHg): 90 mmHg Transmembrane Pressure (mmHg): 10 mmHg Ultrafiltration Rate (mL/min): 660 mL/min Dialysate Flow Rate (mL/min): 0 ml/min Conductivity: Machine : 14 Conductivity: Machine : 14 Dialysis Fluid Bolus: Normal Saline Bolus Amount (mL): 250 mL   Objective:  Vital signs in last 24 hours:  Temp:  [97.8 F (36.6 C)-99.6 F (37.6 C)] 98.4 F (36.9 C) (03/09 0700) Pulse Rate:  [57-69] 59 (03/09 1000) Resp:  [16-31] 22 (03/09 1000) BP: (99-154)/(42-105) 107/61 (03/09 1000) SpO2:  [99 %-100 %] 100 % (03/09 1000) Weight:  [150 kg-151.3 kg] 151.3 kg (03/09 0408)  Weight change: -4.9 kg Filed Weights   08/31/21 2107 08/31/21 2223 09/01/21 0408  Weight: (!) 150 kg (!) 150 kg (!) 151.3 kg    Intake/Output:    Intake/Output Summary (Last 24 hours) at 09/01/2021 1019 Last data filed at 09/01/2021 0600 Gross per 24 hour  Intake 725.69 ml  Output 2465 ml  Net -1739.31 ml      Physical Exam: General: Obese lady, critically ill, laying in the bed  HEENT Positive pressure ventilation mask in place  Pulm/lungs Coarse crackles, BiPAP  CVS/Heart Irregular, paced rhythm  Abdomen:  Soft  Extremities: Some dependent edema  Neurologic: Very somnolent.     Skin: No acute rashes   Access: Left IJ temporary dialysis catheter   Foley in place with small amount of dark urine    Basic Metabolic Panel:  Recent Labs  Lab 08/28/21 0453 08/28/21 1601 08/29/21 0341 08/29/21 1145 08/29/21 1544 08/30/21 0419 08/31/21 0446 09/01/21 0444  NA 134*   < > 136  --  136 138 139 142  K 3.2*   < > 3.9  --  3.9 3.9 3.7 3.7  CL 101   < > 101  --  103 103 103 106  CO2 25   < > 25  --  _0 GLUCOSE 196*   < > 224*  --  203* 218* 195* 154*  BUN 26*   < > 26*  --  29* 40* 54* 62*  CREATININE 1.37*   < > 1.17*  --  1.21* 1.86* 2.06* 2.16*  CALCIUM 9.6   < > 8.3*  --  8.2* 8.3* 8.1* 7.8*  MG 1.9  --  2.4  --   --  2.3 2.4 2.3  PHOS 2.4*   < > 1.7* 2.5 2.1* 1.8* 3.4 3.7   < > = values in this interval not displayed.      CBC: Recent Labs  Lab 08/28/21 0453 08/29/21 0341 08/30/21 0419 08/31/21 0446 09/01/21 0444  WBC 16.1* 10.6* 11.7* 16.0* 14.8*  HGB 11.0* 11.1* 10.8* 11.1* 10.7*  HCT 35.7* 35.1* 34.3* 35.0* 34.6*  MCV 93.5 91.2 91.7 91.4 93.3  PLT 94* 75* 81* 91* 95*  Lab Results  Component Value Date   HEPBSAG NON REACTIVE 08/31/2021   HEPBSAB NON REACTIVE 08/31/2021      Microbiology:  Recent Results (from the past 240 hour(s))  Blood Culture (routine x 2)     Status: Abnormal   Collection Time: 08/25/21 12:29 PM   Specimen: BLOOD RIGHT HAND  Result Value Ref Range Status   Specimen Description   Final    BLOOD RIGHT HAND Performed at [facility], [address], [place], [place] [address]    Special Requests   Final    BOTTLES DRAWN AEROBIC AND ANAEROBIC Blood Culture results may not be optimal due to an inadequate volume of blood received in culture bottles Performed at [facility], [address] [place], [place] [address]    Culture  Setup Time   Final    GRAM NEGATIVE RODS IN BOTH AEROBIC AND ANAEROBIC BOTTLES CRITICAL RESULT CALLED TO, READ BACK BY AND VERIFIED WITH: MORGAN HICKS AT 2207 ON 08/25/21 BY  SS Performed at [facility], [place] [address] [place],  [address]    Culture ESCHERICHIA COLI (A)  Final   Report Status 08/28/2021 FINAL  Final   Organism ID, Bacteria ESCHERICHIA COLI  Final      Susceptibility   Escherichia coli - MIC*    AMPICILLIN 8 SENSITIVE Sensitive     CEFAZOLIN <=4 SENSITIVE Sensitive     CEFEPIME <=0.12 SENSITIVE Sensitive     CEFTAZIDIME <=1 SENSITIVE Sensitive     CEFTRIAXONE <=0.25 SENSITIVE Sensitive     CIPROFLOXACIN <=0.25 SENSITIVE Sensitive     GENTAMICIN <=1 SENSITIVE Sensitive     IMIPENEM <=0.25 SENSITIVE Sensitive     TRIMETH/SULFA <=20 SENSITIVE Sensitive     AMPICILLIN/SULBACTAM <=2 SENSITIVE Sensitive     PIP/TAZO <=4 SENSITIVE Sensitive     * ESCHERICHIA COLI  Blood Culture ID Panel (Reflexed)     Status: Abnormal   Collection Time: 08/25/21 12:29 PM  Result Value Ref Range Status   Enterococcus faecalis NOT DETECTED NOT DETECTED Final   Enterococcus Faecium NOT DETECTED NOT DETECTED Final   Listeria monocytogenes NOT DETECTED NOT DETECTED Final   Staphylococcus species NOT DETECTED NOT DETECTED Final   Staphylococcus aureus (BCID) NOT DETECTED NOT DETECTED Final   Staphylococcus epidermidis NOT DETECTED NOT DETECTED Final   Staphylococcus lugdunensis NOT DETECTED NOT DETECTED Final   Streptococcus species NOT DETECTED NOT DETECTED Final   Streptococcus agalactiae NOT DETECTED NOT DETECTED Final   Streptococcus pneumoniae NOT DETECTED NOT DETECTED Final   Streptococcus pyogenes NOT DETECTED NOT DETECTED Final   A.calcoaceticus-baumannii NOT DETECTED NOT DETECTED Final   Bacteroides fragilis NOT DETECTED NOT DETECTED Final   Enterobacterales DETECTED (A) NOT DETECTED Final    Comment: Enterobacterales represent a large order of gram negative bacteria, not a single organism. CRITICAL RESULT CALLED TO, READ BACK BY AND VERIFIED WITH: MORGAN HICKS AT 2207 ON 08/25/21 BY SS    Enterobacter cloacae complex NOT DETECTED NOT  DETECTED Final   Escherichia coli DETECTED (A) NOT DETECTED Final    Comment: CRITICAL RESULT CALLED TO, READ BACK BY AND VERIFIED WITH: MORGAN HICKS AT 2207 ON 08/25/21 BY SS    Klebsiella aerogenes NOT DETECTED NOT DETECTED Final   Klebsiella oxytoca NOT DETECTED NOT DETECTED Final   Klebsiella pneumoniae NOT DETECTED NOT DETECTED Final   Proteus species NOT DETECTED NOT DETECTED Final   Salmonella species NOT DETECTED NOT DETECTED Final   Serratia marcescens NOT DETECTED  NOT DETECTED Final   Haemophilus influenzae NOT DETECTED NOT DETECTED Final   Neisseria meningitidis NOT DETECTED NOT DETECTED Final   Pseudomonas aeruginosa NOT DETECTED NOT DETECTED Final   Stenotrophomonas maltophilia NOT DETECTED NOT DETECTED Final   Candida albicans NOT DETECTED NOT DETECTED Final   Candida auris NOT DETECTED NOT DETECTED Final   Candida glabrata NOT DETECTED NOT DETECTED Final   Candida krusei NOT DETECTED NOT DETECTED Final   Candida parapsilosis NOT DETECTED NOT DETECTED Final   Candida tropicalis NOT DETECTED NOT DETECTED Final   Cryptococcus neoformans/gattii NOT DETECTED NOT DETECTED Final   CTX-M ESBL NOT DETECTED NOT DETECTED Final   Carbapenem resistance IMP NOT DETECTED NOT DETECTED Final   Carbapenem resistance KPC NOT DETECTED NOT DETECTED Final   Carbapenem resistance NDM NOT DETECTED NOT DETECTED Final   Carbapenem resist OXA 48 LIKE NOT DETECTED NOT DETECTED Final   Carbapenem resistance VIM NOT DETECTED NOT DETECTED Final    Comment: Performed at [facility], [address] [place], [place] [address]  Urine Culture     Status: Abnormal   Collection Time: 08/25/21  1:47 PM   Specimen: In/Out Cath Urine  Result Value Ref Range Status   Specimen Description   Final    IN/OUT CATH URINE Performed at [facility], [address] [place], [place] [address]    Special Requests   Final    NONE Performed at [facility], [address]  [place], [place] [address]    Culture >=100,000 COLONIES/mL ESCHERICHIA COLI (A)  Final   Report Status 08/28/2021 FINAL  Final   Organism ID, Bacteria ESCHERICHIA COLI (A)  Final      Susceptibility   Escherichia coli - MIC*    AMPICILLIN 8 SENSITIVE Sensitive     CEFAZOLIN <=4 SENSITIVE Sensitive     CEFEPIME <=0.12 SENSITIVE Sensitive     CEFTRIAXONE <=0.25 SENSITIVE Sensitive     CIPROFLOXACIN <=0.25 SENSITIVE Sensitive     GENTAMICIN <=1 SENSITIVE Sensitive     IMIPENEM <=0.25 SENSITIVE Sensitive     NITROFURANTOIN <=16 SENSITIVE Sensitive     TRIMETH/SULFA <=20 SENSITIVE Sensitive     AMPICILLIN/SULBACTAM <=2 SENSITIVE Sensitive     PIP/TAZO <=4 SENSITIVE Sensitive     * >=100,000 COLONIES/mL ESCHERICHIA COLI  Resp Panel by RT-PCR (Flu A&B, Covid) Nasopharyngeal Swab     Status: None   Collection Time: 08/25/21  3:05 PM   Specimen: Nasopharyngeal Swab; Nasopharyngeal(NP) swabs in vial transport medium  Result Value Ref Range Status   SARS Coronavirus 2 by RT PCR NEGATIVE NEGATIVE Final    Comment: (NOTE) SARS-CoV-2 target nucleic acids are NOT DETECTED.  The SARS-CoV-2 RNA is generally detectable in upper respiratory specimens during the acute phase of infection. The lowest concentration of SARS-CoV-2 viral copies this assay can detect is 138 copies/mL. A negative result does not preclude SARS-Cov-2 infection and should not be used as the sole basis for treatment or other patient management decisions. A negative result may occur with  improper specimen collection/handling, submission of specimen other than nasopharyngeal swab, presence of viral mutation(s) within the areas targeted by this assay, and inadequate number of viral copies(<138 copies/mL). A negative result must be combined with clinical observations, patient history, and epidemiological information. The expected result is Negative.  Fact Sheet for Patients:  EntrepreneurPulse.com.au  Fact Sheet  for Healthcare Providers:  IncredibleEmployment.be  This test is no t yet approved or cleared by the [place] and  has been authorized for detection and/or diagnosis of SARS-CoV-2 by FDA under an Emergency Use Authorization (EUA). This EUA will remain  in effect (meaning this test can be used) for the duration of the COVID-19 declaration under Section 564(b)(1) of the Act, 21 U.S.C.section 360bbb-3(b)(1), unless the authorization is terminated  or revoked sooner.       Influenza A by PCR NEGATIVE NEGATIVE Final   Influenza B by PCR NEGATIVE NEGATIVE Final    Comment: (NOTE) The Xpert Xpress SARS-CoV-2/FLU/RSV plus assay is intended as an aid in the diagnosis of influenza from Nasopharyngeal swab specimens and should not be used as a sole basis for treatment. Nasal washings and aspirates are unacceptable for Xpert Xpress SARS-CoV-2/FLU/RSV testing.  Fact Sheet for Patients: EntrepreneurPulse.com.au  Fact Sheet for Healthcare Providers: IncredibleEmployment.be  This test is not yet approved or cleared by the [place] FDA and has been authorized for detection and/or diagnosis of SARS-CoV-2 by FDA under an Emergency Use Authorization (EUA). This EUA will remain in effect (meaning this test can be used) for the duration of the COVID-19 declaration under Section 564(b)(1) of the Act, 21 U.S.C. section 360bbb-3(b)(1), unless the authorization is terminated or revoked.  Performed at [facility], [address] [place], [place] [address]   MRSA Next Gen by PCR, Nasal     Status: None   Collection Time: 08/25/21  6:44 PM   Specimen: Nasal Mucosa; Nasal Swab  Result Value Ref Range Status   MRSA by PCR Next Gen NOT DETECTED NOT DETECTED Final    Comment: (NOTE) The GeneXpert MRSA Assay (FDA approved for NASAL specimens only), is one component of a comprehensive MRSA colonization surveillance program. It  is not intended to diagnose MRSA infection nor to guide or monitor treatment for MRSA infections. Test performance is not FDA approved in patients less than 64 years old. Performed at [facility], [address] [place], [place] [address]   Blood Culture (routine x 2)     Status: None   Collection Time: 08/25/21  7:38 PM   Specimen: BLOOD  Result Value Ref Range Status   Specimen Description BLOOD RIGHT HAND  Final   Special Requests   Final    BOTTLES DRAWN AEROBIC AND ANAEROBIC Blood Culture adequate volume   Culture   Final    NO GROWTH 5 DAYS Performed at [facility], [address], [place], [place] [address]    Report Status 08/30/2021 FINAL  Final  Culture, Respiratory w Gram Stain     Status: None   Collection Time: 08/26/21  8:21 AM   Specimen: Tracheal Aspirate; Respiratory  Result Value Ref Range Status   Specimen Description   Final    TRACHEAL ASPIRATE Performed at [facility], [address] [place], [place] [address]    Special Requests   Final    NONE Performed at [facility], [address] [place], [place] [address]    Gram Stain   Final    RARE SQUAMOUS EPITHELIAL CELLS PRESENT RARE WBC PRESENT, PREDOMINANTLY MONONUCLEAR NO ORGANISMS SEEN    Culture   Final    RARE Normal respiratory flora-no Staph aureus or Pseudomonas seen Performed at [facility], [address], [place] [place], [place] [address]    Report Status 08/29/2021 FINAL  Final    Coagulation Studies: No results for input(s): LABPROT, INR in the last 72 hours.   Urinalysis: No results for input(s): COLORURINE, LABSPEC, [place], [place], [place], [place],  KETONESUR, PROTEINUR, UROBILINOGEN, NITRITE, LEUKOCYTESUR in the last 72 hours.  Invalid input(s): APPERANCEUR    Imaging: CT HEAD WO CONTRAST (5MM)  Result Date: 08/31/2021 CLINICAL DATA:  Mental status change, unknown cause EXAM: CT HEAD WITHOUT CONTRAST TECHNIQUE: Contiguous axial  images were obtained from the base of the skull through the vertex without intravenous contrast. RADIATION DOSE REDUCTION: This exam was performed according to the departmental dose-optimization program which includes automated exposure control, adjustment of the mA and/or kV according to patient size and/or use of iterative reconstruction technique. COMPARISON:  None. FINDINGS: Brain: No evidence of acute infarction, hemorrhage, hydrocephalus, extra-axial collection or mass lesion/mass effect. Scattered low-density changes within the periventricular and subcortical white matter compatible with chronic microvascular ischemic change. Mild-moderate diffuse cerebral volume loss. Vascular: No hyperdense vessel or unexpected calcification. Skull: Normal. Negative for fracture or focal lesion. Sinuses/Orbits: No acute finding. Other: None. IMPRESSION: 1. No acute intracranial abnormality. 2. Chronic microvascular ischemic change and cerebral volume loss. Electronically Signed   By: Davina Poke D.O.   On: 08/31/2021 14:11     Medications:    sodium chloride 10 mL/hr at 09/01/21 0600   cefTRIAXone (ROCEPHIN)  IV Stopped (08/31/21 2244)   heparin 1,800 Units/hr (09/01/21 0600)    chlorhexidine gluconate (MEDLINE KIT)  15 mL Mouth Rinse BID   Chlorhexidine Gluconate Cloth  6 each Topical Q0600   docusate  100 mg Per Tube BID   heparin sodium (porcine)       insulin aspart  0-20 Units Subcutaneous Q4H   insulin glargine-yfgn  20 Units Subcutaneous Daily   mouth rinse  15 mL Mouth Rinse 10 times per day   pantoprazole (PROTONIX) IV  40 mg Intravenous Daily   polyethylene glycol  17 g Per Tube Daily   sodium chloride flush  10-40 mL Intracatheter Q12H   sodium chloride, acetaminophen, heparin sodium (porcine), heparin sodium (porcine), ipratropium-albuterol, midazolam, ondansetron **OR** ondansetron (ZOFRAN) IV, sodium chloride flush  Assessment/ Plan:  71 y.o. 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  Admit creatinine of 2.31 that peaked at 4.47  03/08 0701 - 03/09 0700 In: 781.7 [I.V.:681.7; IV Piggyback:100] Out: 2615 [Urine:3075]    CRRT was discontinued on March 6. Electrolytes are acceptable.   Good response to IV Lasix yesterday. Ultrafiltration only treatment today for goal of 1 to 1.5 L removal Depending on response, may try furosemide again tomorrow  #Acute respiratory failure Extubated on March 6 Currently requiring NIPPV  # Sepsis with UTI, hypotension, shock E Coli Pyelonephritis and bacteremia # Nephrolithiasis with rt proximal stone with hydronephrosis and left UPJ stone without hydronephrosis. On 3/2, patient underwent cystoscopy with bilateral ureteral stents placed by Dr. Diamantina Providence. Patient was found to have purulent urine in bladder. Plan for outpatient b/l uretereroscopy/laser lithotripsy in 1-2 months Small stone retrieved by nursing yesterday.  Sent for analysis.      LOS: [place] 3/9/202310:19 AM  [place], [place]  Note: This note was prepared with Dragon dictation. Any transcription errors are unintentional

## 2021-09-01 NOTE — Progress Notes (Signed)
Nutrition Follow Up Note  ? ?DOCUMENTATION CODES:  ? ?Morbid obesity ? ?INTERVENTION:  ? ?RD will add supplements once pt's diet is advanced  ? ?Recommend NGT placement and nutrition support if unable to advance pt's diet in the next 24 hrs.  ? ?If NGT placed, recommend: ? ?Osmolite 1.5@70ml /hr- Initiate at 76m/hr and increase by 110mhr q 8 hours until goal rate is reached.  ? ?Pro-Source 4511mID via tube, provides 40kcal and 11g of protein per serving  ? ?Free water flushes 58m27m hours to maintain tube patency  ? ?Regimen provides 2600kcal/day, 127g/day protein and 1460ml76m of free water  ? ?Pt at high refeed risk; recommend monitor potassium, magnesium and phosphorus labs daily until stable ? ?NUTRITION DIAGNOSIS:  ? ?Inadequate oral intake related to inability to eat (pt sedated and ventilated) as evidenced by NPO status. ? ?GOAL:  ? ?Patient will meet greater than or equal to 90% of their needs ?-not met  ? ?MONITOR:  ? ?Diet advancement, Labs, Weight trends, Skin, I & O's ? ?ASSESSMENT:  ? ?70 y/89female with h/o HTN, DM, CAD, HLD, CHF, Afib and s/p TAVR who is admitted with obstructing ureteral stone, AKI and sepsis. Pt s/p cystoscopy and bilateral ureteral stent placement 3/2. ? ?Pt has remained NPO since extubation r/t bipap. Pt is off bipap today but is not alert enough for SLP evaluation. Pt has had an improvement in mental status since yesterday. Will give patient one more day to see if she can be evaluated by SLP; if unable to advance pt's diet by tomorrow, recommend NGT placement and feeds. RD will monitor for diet advancement vs the need for nutrition support. Pt is at high refeed risk. No BM noted this admission; recommend bowel regimen with diet advancement. Per chart, pt is down 11lbs since admission; pt -608ml 76mer I & Os.  ? ?Medications reviewed and include: colace, insulin, protonix, miralax, ceftriaxone, heparin ? ?Labs reviewed: K 3.7 wnl, BUN 62(H), creat 2.16(H), P 3.7 wnl, Mg 2.3  wnl ?Wbc- 14.8(H), Hgb 10.7(L), Hct 34.6(L) ?Cbgs- 153, 143, 148, 162, 180 x 24 hrs ? ?Diet Order:   ?Diet Order   ? ?       ?  Diet NPO time specified  Diet effective now       ?  ? ?  ?  ? ?  ? ?EDUCATION NEEDS:  ? ?No education needs have been identified at this time ? ?Skin:  Skin Assessment: Reviewed RN Assessment (MASD, incision perineum) ? ?Last BM:  pta ? ?Height:  ? ?Ht Readings from Last 1 Encounters:  ?08/26/21 5' 5"  (1.651 m)  ? ? ?Weight:  ? ?Wt Readings from Last 1 Encounters:  ?09/01/21 (!) 149.8 kg  ? ? ?Ideal Body Weight:  56.8 kg ? ?BMI:  Body mass index is 54.96 kg/m?. ? ?Estimated Nutritional Needs:  ? ?Kcal:  2500-2800kcal/day ? ?Protein:  >125g/day ? ?Fluid:  1.4-1.7L/day ? ?Anthoney Sheppard Koleen DistanceD, LDN ?Please refer to AMION for RD and/or RD on-call/weekend/after hours pager ? ?

## 2021-09-01 NOTE — Progress Notes (Signed)
NAME:  [name], MRN:  [id number], DOB:  March 29, 1951, LOS: 7 ADMISSION DATE:  08/25/2021, CONSULTATION DATE:  08/25/2021 REFERRING MD:  Dr. Francine Graven, CHIEF COMPLAINT:  Altered Mental Status   Brief Pt Description / Synopsis:  71 y.o. Female admitted with Severe Urosepsis with Septic Shock in the setting of Right proximal ureteral stone with Hydronephrosis & large left renal stone, Acute Kidney Injury, and Acute Metabolic Encephalopathy.  She underwent urgent cystoscopy with bilateral ureteral stent placement.  She returns to ICU post procedure and remains intubated.  History of Present Illness:  [name] is a 71 y.o. female with a past medical history significant for HFpEF, severe aortic stenosis status post TAVR, morbid obesity, obesity hypoventilation syndrome, OSA, diabetes mellitus, coronary artery disease, hypertension who presented to [facility] ED on 08/25/2021 due to complaints of altered mental status.  Patient is currently intubated and sedated following return from the OR and no family is available, therefore history is obtained from chart review.  ED Course: Initial vital signs: Temperature 99.2, respiratory rate 27, pulse , blood pressure  Significant Labs: Potassium 3.4, bicarbonate 22, glucose 182, BUN 46, creatinine 2.31, calcium 8.5, anion gap 10, albumin 2.9, AST 72, ALT 36, total bilirubin 5.7, alkaline phosphatase 45, WBC 8.9, hemoglobin 11.8, hematocrit 37.5, platelets 120, PT 18.5, INR 1.6, lactic acid 4.3 COVID-19 PCR is negative Urinalysis consistent with UTI Imaging: Chest x-ray>>IMPRESSION: Mild right basilar subsegmental atelectasis or infiltrate is noted. CT chest>>IMPRESSION: Bilateral perihilar airspace filling pattern left more than right most consistent with acute edema/ARDS. Pneumonia not excluded, but there is no dense consolidation, lobar distribution or collapse. No effusion. Aortic Atherosclerosis CT abdomen pelvis>>IMPRESSION: Hydronephrosis on the right  due to 2 adjacent 6 mm stones in the proximal right ureter, at about the level of the lower pole of the kidney. Possibility of infection associated with the obstruction does exist. 1 cm stone in the left renal pelvis without evidence of obstruction presently. Chololithiasis without CT evidence of cholecystitis or obstruction. Insignificant 13 mm adrenal adenoma, unchanged since previous exam. Medications administered: 2.5 L of LR boluses, IV cefepime, Flagyl, vancomycin   Hospitalist were asked to admit for further work-up and treatment of severe urosepsis due to right proximal ureter stone with hydronephrosis and large left renal stone.  Urology was consulted.  She was taken urgently to the OR for cystoscopy with bilateral ureteral stent placement.  Anesthesia does report she required vasopressors Intra-Op.  She returns to ICU post procedure and remains intubated.  PCCM consulted for management of mechanical ventilation and critical illness.  08/29/21- patient with improved renal failure on ventilator.  She passed SBT and was liberated from MV.  I called and updated family today  08/30/21- patient improved s/p treatment of E coli bacteremia and AKI.  Plan for HD tommorow.   08/31/21- patient with sepsis from UTI and bacteremia Ecoli . Nephrology for possibe HD  .  Encephalopathy from septic and metabolic encephalopathy. CT head is done due to poor mentation but is normal and ABG istable.    Newer results are available. Click to view them now.      Component 6 d ago  Specimen Description IN/OUT CATH URINE  Performed at [facility], [address], [place], [place] [address]   Special Requests NONE  Performed at [facility], [address] [place], [place] [address]   Culture >=100,000 COLONIES/mL ESCHERICHIA COLI Abnormal    Report Status 08/28/2021 FINAL   Organism ID, Bacteria ESCHERICHIA  COLI Abnormal    Resulting Agency [facility]      09/01/21- patient seems to  have improved sensorium today able to wiggle toes.  She is making urine and its less purulent more amber. Overall improved clinically.  No vasopressor support.      Pertinent  Medical History  HFpEF Severe aortic stenosis status post TAVR Coronary artery disease Hypertension Hyperlipidemia Obesity hypoventilation syndrome OSA Morbid obesity Diabetes mellitus  Micro Data:  08/25/2021: SARS-CoV-2 and influenza PCR>> negative 08/25/2021: Blood culture>>+E Coli 08/25/2021: Urine>>+E coli 08/25/2021: Tracheal aspirate>> 08/25/2021: Strep pneumo urinary antigen>>negative 08/25/2021: Legionella urine antigen>>  Antimicrobials:  Cefepime x1 dose 3/2 Flagyl x1 dose 3/2 Vancomycin x1 dose 3/2 Ceftriaxone 3/2>>  Significant Hospital Events: Including procedures, antibiotic start and stop dates in addition to other pertinent events   3/2: Presented to ED, admitted by hospitalist.  Urology consulted, taken emergently to the OR for cystoscopy and bilateral ureteral stent placement.  Returns to ICU postop and remains intubated, PCCM consulted 3/3: worsening shock, added vasopressin; started CRRT 3/4: shock improving; on CRRT; minimal vent settings  Interim History / Subjective:  Shock improving, now off Levophed. Remains on CRRT. On minimal vent settings. Not following commands yet but still working down on sedation. E coli in blood and urine is pan-sensitive.  Objective   Blood pressure (!) 124/58, pulse (!) 59, temperature 98 F (36.7 C), temperature source Axillary, resp. rate (!) 22, height $RemoveBe'5\' 5"'rnrKDQOID$  (1.651 m), weight (!) 149.8 kg, SpO2 100 %.        Intake/Output Summary (Last 24 hours) at 09/01/2021 1110 Last data filed at 09/01/2021 1016 Gross per 24 hour  Intake 755.69 ml  Output 3965 ml  Net -3209.31 ml    Filed Weights   08/31/21 2223 09/01/21 0408 09/01/21 1019  Weight: (!) 150 kg (!) 151.3 kg (!) 149.8 kg    Examination: General: Caucasian female in NAD, intubated and  sedated Lungs: decreased at the bases bilaterally, otherwise clear Cardiovascular: Regular rate and rhythm, S1-S2, no murmurs, rubs, gallops Abdomen: Morbidly obese, soft, nontender, nondistended, no guarding rebound tenderness, distant bowel sounds Extremities: No deformities, chronic venous trophic changes to BLE  Resolved Hospital Problem list     Assessment & Plan:   Septic Shock 2/2 E coli pyelonephritis with bacteremia Bilateral ureteral stones with severe left hydronephrosis s/p b/l ureterals stent placement (08/25/21) -Wean pressors as able; off vasopressin, cont Levo -MAP goal >65 -Stress dose steroids until off pressors -Continue ceftriaxone 2 g daily for planned 7 day course  Chronic HFpEF Demand ischemia Paroxsymal Atrial Fibrillation PMHx: Severe aortic stenosis s/p TAVR, CAD, MI, Hypertension, Hyperlipidemia -Continuous cardiac monitoring -Maintain MAP >65 as above -Vasopressors as needed to maintain MAP goal -EF down to 45-50% from 50-55% prior in setting of acute infection -Optimize sepsis and ensure GDMT with Cardiology follow up -Heparin gtt for Afib  Acute Hypoxic Respiratory Failure in the setting of septic shock, multiple metabolic derangements PMHx: OSA -Full vent support, implement lung protective strategies -Plateau pressures less than 30 cm H20 -Wean FiO2 & PEEP as tolerated to maintain O2 sats >92% -Follow intermittent Chest X-ray & ABG as needed -Spontaneous Breathing Trials when respiratory parameters met and mental status permits -Implement VAP Bundle -Prn Bronchodilators  Acute Kidney Injury Anion Gap Metabolic Acidosis in the setting of Lactic Acidosis and AKI -Nephrology consulted -Continue CRRT; goal UF 0 -Avoid nephrotoxins -Renally dose medications -Strict I/O; monitor UOP  Mild Thrombocytopenia, likely in setting of septic shock -Monitor  for S/Sx of bleeding -Trend CBC -Heparin as above -Transfuse for Hgb <7  Acute Metabolic  Encephalopathy Sedation needs in the setting of mechanical ventilation -Maintain a RASS goal of 0 to -1 -Daily wake up assessment, SBT today -Treat sepsis and metabolic derangements as above  Diabetes Mellitus -CBG's q4h; Target range of 140 to 180 -SSI -Follow ICU Hypo/Hyperglycemia protocol   Best Practice (right click and "Reselect all SmartList Selections" daily)   Diet/type: NPO; tube feeds DVT prophylaxis: systemic heparin GI prophylaxis: PPI Lines: Central line, Dialysis Catheter, and yes and it is still needed Foley:  Yes, and it is still needed Code Status:  full code Last date of multidisciplinary goals of care discussion [08/26/21]  Labs   CBC: Recent Labs  Lab 08/28/21 0453 08/29/21 0341 08/30/21 0419 08/31/21 0446 09/01/21 0444  WBC 16.1* 10.6* 11.7* 16.0* 14.8*  HGB 11.0* 11.1* 10.8* 11.1* 10.7*  HCT 35.7* 35.1* 34.3* 35.0* 34.6*  MCV 93.5 91.2 91.7 91.4 93.3  PLT 94* 75* 81* 91* 95*     Basic Metabolic Panel: Recent Labs  Lab 08/28/21 0453 08/28/21 1601 08/29/21 0341 08/29/21 1145 08/29/21 1544 08/30/21 0419 08/31/21 0446 09/01/21 0444  NA 134*   < > 136  --  136 138 139 142  K 3.2*   < > 3.9  --  3.9 3.9 3.7 3.7  CL 101   < > 101  --  103 103 103 106  CO2 25   < > 25  --  _0 GLUCOSE 196*   < > 224*  --  203* 218* 195* 154*  BUN 26*   < > 26*  --  29* 40* 54* 62*  CREATININE 1.37*   < > 1.17*  --  1.21* 1.86* 2.06* 2.16*  CALCIUM 9.6   < > 8.3*  --  8.2* 8.3* 8.1* 7.8*  MG 1.9  --  2.4  --   --  2.3 2.4 2.3  PHOS 2.4*   < > 1.7* 2.5 2.1* 1.8* 3.4 3.7   < > = values in this interval not displayed.    GFR: Estimated Creatinine Clearance: 36 mL/min (A) (by C-G formula based on SCr of 2.16 mg/dL (H)). Recent Labs  Lab 08/25/21 1939 08/25/21 2111 08/26/21 0330 08/26/21 1310 08/27/21 0415 08/27/21 0800 08/27/21 1456 08/28/21 0453 08/29/21 0341 08/30/21 0419 08/31/21 0446 09/01/21 0444  PROCALCITON 124.35  --  >150.00  --   79.09  --   --   --   --   --   --   --   WBC  --    < > 27.7*  --  20.1*  --   --    < > 10.6* 11.7* 16.0* 14.8*  LATICACIDVEN  --    < > 3.2* 3.9*  --  2.8* 2.2*  --   --   --   --   --    < > = values in this interval not displayed.     Liver Function Tests: Recent Labs  Lab 08/25/21 1128 08/26/21 1600 08/29/21 0341 08/29/21 1544 08/30/21 0419 08/31/21 0446 09/01/21 0444  AST 72*  --   --   --   --   --   --   ALT 36  --   --   --   --   --   --   ALKPHOS 45  --   --   --   --   --   --  BILITOT 0.9  --   --   --   --   --   --   PROT 5.7*  --   --   --   --   --   --   ALBUMIN 2.9*   < > 2.5* 2.6* 2.4* 2.2* 2.2*   < > = values in this interval not displayed.    No results for input(s): LIPASE, AMYLASE in the last 168 hours. No results for input(s): AMMONIA in the last 168 hours.  ABG    Component Value Date/Time   PHART 7.47 (H) 08/31/2021 0839   PCO2ART 33 08/31/2021 0839   PO2ART 116 (H) 08/31/2021 0839   HCO3 24.0 08/31/2021 0839   ACIDBASEDEF 0.2 08/29/2021 1627   O2SAT 99.1 08/31/2021 0839      Coagulation Profile: Recent Labs  Lab 08/25/21 1128 08/26/21 0330 08/27/21 0415  INR 1.6* 1.9* 1.8*     Cardiac Enzymes: No results for input(s): CKTOTAL, CKMB, CKMBINDEX, TROPONINI in the last 168 hours.  HbA1C: Hgb A1c MFr Bld  Date/Time Value Ref Range Status  08/25/2021 09:11 PM 7.6 (H) 4.8 - 5.6 % Final    Comment:    (NOTE)         Prediabetes: 5.7 - 6.4         Diabetes: >6.4         Glycemic control for adults with diabetes: <7.0   07/12/2017 01:09 AM 6.4 (H) 4.8 - 5.6 % Final    Comment:    (NOTE) Pre diabetes:          5.7%-6.4% Diabetes:              >6.4% Glycemic control for   <7.0% adults with diabetes     CBG: Recent Labs  Lab 08/31/21 1120 08/31/21 1623 08/31/21 1947 08/31/21 2335 09/01/21 0354  GLUCAP 180* 162* 148* 143* 153*     Review of Systems:   Unable to assess due to intubation and sedation   Past  Medical History:  She,  has a past medical history of Bacteremia, CAD (coronary artery disease), Diabetes mellitus without complication ([place]), Diastolic CHF ([place]), Hyperlipemia, Hypertension, Lymphedema, MI (myocardial infarction) ([place]), OSA (obstructive sleep apnea), and Severe aortic stenosis.   Surgical History:   Past Surgical History:  Procedure Laterality Date   CARDIAC SURGERY     CYSTOSCOPY W/ URETERAL STENT PLACEMENT  08/25/2021   Procedure: CYSTOSCOPY WITH RETROGRADE PYELOGRAM/URETERAL STENT PLACEMENT;  Surgeon: Billey Co, MD;  Location: [facility] ORS;  Service: Urology;;   none     PACEMAKER INSERTION       Social History:   reports that she has never smoked. She has never used smokeless tobacco. She reports that she does not drink alcohol and does not use drugs.   Family History:  Her family history includes Cancer in her mother; Heart failure in her father.   Allergies Allergies  Allergen Reactions   Tetanus Toxoid, Adsorbed Swelling   Tetanus Toxoid     Other reaction(s): Unknown   Tetanus Toxoids Swelling   Lisinopril Rash    Other reaction(s): Unknown   Niacin Rash    Other reaction(s): Unknown   Sitagliptin Rash    Other reaction(s): Unknown   Sulfamethoxazole-Trimethoprim Rash    Other reaction(s): Unknown     Home Medications  Prior to Admission medications   Medication Sig Start Date End Date Taking? Authorizing Provider  acetaminophen (TYLENOL) 325 MG tablet Take 2 tablets (650 mg total)  by mouth every 6 (six) hours as needed for mild pain (or Fever >/= 101). 11/05/17  Yes Wieting, Richard, MD  amLODipine (NORVASC) 5 MG tablet Take 5 mg by mouth daily. 08/09/21  Yes [provider]  apixaban (ELIQUIS) 5 MG TABS tablet Take 1 tablet by mouth 2 (two) times daily. 08/10/17  Yes [provider]  atorvastatin (LIPITOR) 40 MG tablet Take 1 tablet (40 mg total) by mouth daily at 6 PM. 05/09/16  Yes Epifanio Lesches, MD  cefpodoxime (VANTIN)  100 MG tablet Take 1 tablet (100 mg total) by mouth 2 (two) times daily. 11/29/20  Yes Billey Co, MD  Cholecalciferol 50 MCG (2000 UT) CAPS Take 1 capsule by mouth daily.   Yes [provider]  docusate sodium (COLACE) 100 MG capsule Take 1 capsule (100 mg total) by mouth daily. 11/05/17  Yes Wieting, Richard, MD  fenofibrate (TRICOR) 145 MG tablet Take 1 tablet by mouth daily. 04/17/16  Yes [provider]  furosemide (LASIX) 40 MG tablet Take 1 tablet (40 mg total) by mouth daily. 07/17/16  Yes Vaughan Basta, MD  insulin aspart (NOVOLOG FLEXPEN) 100 UNIT/ML FlexPen Inject 30 Units into the skin daily. 10/28/19  Yes [provider]  Insulin Glargine (BASAGLAR KWIKPEN) 100 UNIT/ML Inject 70 Units into the skin daily. 04/23/17  Yes [provider]  metFORMIN (GLUCOPHAGE) 1000 MG tablet Take 1 tablet (1,000 mg total) by mouth daily with breakfast. 05/22/17  Yes Max Sane, MD  metoprolol tartrate (LOPRESSOR) 25 MG tablet Take 1 tablet (25 mg total) by mouth 2 (two) times daily. 05/22/17  Yes Max Sane, MD  Potassium Chloride ER 20 MEQ TBCR Take 1 tablet by mouth daily. 02/09/20  Yes [provider]  amiodarone (PACERONE) 200 MG tablet Take 1 tablet (200 mg total) by mouth daily. Patient not taking: Reported on 08/25/2021 11/06/17   Loletha Grayer, MD  BD PEN NEEDLE NANO 2ND GEN 32G X 4 MM MISC 4 (four) times daily. use as directed 02/09/20   [provider]  tamsulosin (FLOMAX) 0.4 MG CAPS capsule Take 1 capsule (0.4 mg total) by mouth daily. Patient not taking: Reported on 08/25/2021 02/05/20   Billey Co, MD     Critical care provider statement:   Total critical care time: 33 minutes   Performed by: Lanney Gins MD   Critical care time was exclusive of separately billable procedures and treating other patients.   Critical care was necessary to treat or prevent imminent or life-threatening deterioration.   Critical care was  time spent personally by me on the following activities: development of treatment plan with patient and/or surrogate as well as nursing, discussions with consultants, evaluation of patient's response to treatment, examination of patient, obtaining history from patient or surrogate, ordering and performing treatments and interventions, ordering and review of laboratory studies, ordering and review of radiographic studies, pulse oximetry and re-evaluation of patient's condition.    Ottie Glazier, M.D.  Pulmonary & Critical Care Medicine

## 2021-09-01 NOTE — Progress Notes (Signed)
Hemodialysis notes ? ?07:45 HD started. Accessed used: CVC ?10:16 HD treatment completed. Tolerated well. No HD complications noted. Patient has no complaints. Total treatment time 2 hours and 30 minutes. Total fluid net removed 1.5L.  ?

## 2021-09-01 NOTE — Consult Note (Signed)
ANTICOAGULATION CONSULT NOTE ? ?Pharmacy Consult for Heparin gtt (PTA Eliquis) ?Indication: atrial fibrillation ? ?Patient Measurements: ?Height: 5\' 5"  (165.1 cm) ?Weight: (!) 151.3 kg (333 lb 8.9 oz) ?IBW/kg (Calculated) : 57 ?Heparin Dosing Weight: 96.5kg ? ?Vital Signs: ?Temp: 98.9 ?F (37.2 ?C) (03/09 0400) ?Temp Source: Axillary (03/09 0400) ?BP: 126/58 (03/09 0600) ?Pulse Rate: 58 (03/09 0600) ? ?Labs: ?Recent Labs  ?  08/30/21 ?0015 08/30/21 ?0015 08/30/21 ?9371 08/31/21 ?6967 09/01/21 ?0444  ?HGB  --    < > 10.8* 11.1* 10.7*  ?HCT  --   --  34.3* 35.0* 34.6*  ?PLT  --   --  81* 91* 95*  ?APTT 59*  --  61*  --   --   ?HEPARINUNFRC 0.43  --  0.44 0.41 0.38  ?CREATININE  --   --  1.86* 2.06* 2.16*  ? < > = values in this interval not displayed.  ? ? ? ?Estimated Creatinine Clearance: 36.2 mL/min (A) (by C-G formula based on SCr of 2.16 mg/dL (H)). ? ? ?Medical History: ?Past Medical History:  ?Diagnosis Date  ? Bacteremia   ? CAD (coronary artery disease)   ? Diabetes mellitus without complication ([place])   ? Diastolic CHF ([place])   ? Hyperlipemia   ? Hypertension   ? Lymphedema   ? MI (myocardial infarction) ([place])   ? OSA (obstructive sleep apnea)   ? Severe aortic stenosis   ? ? ?Medications: No AC/APT pertinent drug allergies ?Heparin Dosing Weight: 96.5kg ?PTA: Apixaban 5mg  BID (last dose on 3/02 AM PTA >> admitted on 3/02 - 1344) ?Inpatient: Apixaban (stopped w/o receiving any doses) >> Heparin gtt. ? ?Assessment: ?71yo F w/ h/o  HFpEF, severe AS (s/p TAVR), morbid obesity (BMI 57), obesity hypoventilation syndrome, OSA, DM, CAD, & HTN who presented to [facility] ED on 08/25/2021 due to complaints of altered mental 2/2 severe urosepsis and septic shock ISO Rt ureteral stone, AKI, ac metabolic enceph. Pharmacy consulted for conversion of DOAC to Heparin gtt. Since admission her platelets have been trending down from a low baseline. A decision was made to continue heparin but lower the therapeutic goal range ?   ? ?Goal  of Therapy:  ?Heparin level 0.3-0.5 units/ml ?aPTT 66-85 seconds ?Monitor platelets by anticoagulation protocol: Yes ? ?3/7 0015 aPTT 59, subtherapeutic, HL 0.43 therapeutic ?3/7 0419 HL 0.44 therapeutic, aPTT 61 ?3/8 0446 HL 0.41, therapeutic x 3  ?3/9 0444 HL 0.38, therapeutic x 4 ?  ?Plan:  ?Continue heparin infusion rate at 1800 units/hr. ?Recheck HL daily w/ AM labs while therapeutic  ?Daily CBC while on IV heparin ? ?Renda Rolls, PharmD, MBA ?09/01/2021 ?6:12 AM ? ? ? ? ? ? ?

## 2021-09-01 NOTE — Consult Note (Signed)
PHARMACY CONSULT NOTE - FOLLOW UP ? ?Pharmacy Consult for Electrolyte Monitoring and Replacement  ? ?Recent Labs: ?Potassium (mmol/L)  ?Date Value  ?09/01/2021 3.7  ? ?Magnesium (mg/dL)  ?Date Value  ?09/01/2021 2.3  ? ?Calcium (mg/dL)  ?Date Value  ?09/01/2021 7.8 (L)  ? ?Albumin (g/dL)  ?Date Value  ?09/01/2021 2.2 (L)  ? ?Phosphorus (mg/dL)  ?Date Value  ?09/01/2021 3.7  ? ?Sodium (mmol/L)  ?Date Value  ?09/01/2021 142  ? ? ? ?Assessment: ?71 yo F w/ h/o HFpEF, Severe AS, CAD (AC-paced), HTN, BMI 57, DM, severe urosepsis with septic shock ISO Rt prox ureteral stone w/ hydronephrosis & large Lt renal stone c/b AKI, acute metabolic encephalopathy. S/p cytoscopy B/L ureteral stents on 3/02. Pharmacy consulted for the mgmt of electrolytes. ? ?Goal of Therapy:  ?Lytes WNL ? ?Plan:  ?Scr 1.17>1.86>2.06>2.16;  ?Current Lytes are stable WNL, +1.1L; UOP 0.48ml/k/h. Will CTM and replace as needed. ?CTM daily BMP, Mg, Phos ? ?Lorna Dibble, PharmD, BCCP ?Clinical Pharmacist ?09/01/2021 10:14 AM ? ? ?

## 2021-09-01 NOTE — TOC Progression Note (Signed)
Transition of Care (TOC) - Progression Note  ? ? ?Patient Details  ?Name: [name] ?MRN: [id number] ?Date of Birth: Jun 06, 1951 ? ?Transition of Care (TOC) CM/SW Contact  ?Shelbie Hutching, RN ?Phone Number: ?09/01/2021, 12:00 PM ? ?Clinical Narrative:    ? ?Patient is tolerating [place] at 2L today.  Bedside RN reports patient is starting to answer questions and follow commands.  ? ?Expected Discharge Plan:  (TBD) ?Barriers to Discharge: Continued Medical Work up ? ?Expected Discharge Plan and Services ?Expected Discharge Plan:  (TBD) ?  ?Discharge Planning Services: CM Consult ?  ?Living arrangements for the past 2 months: Apartment ?                ?DME Arranged: N/A ?DME Agency: NA ?  ?  ?  ?HH Arranged: RN ?[place] Agency: [employer] ?Date HH Agency Contacted: 08/26/21 ?Time [place]: [id number] ?  ? ? ?Social Determinants of Health (SDOH) Interventions ?  ? ?Readmission Risk Interventions ?No flowsheet data found. ? ?

## 2021-09-01 NOTE — Progress Notes (Addendum)
0800 BiPAP removed and switched to 2 L nasal cannula. Patient moaning but denies pain. Knows her name. Disoriented to place time and situation. Dialysis nurse in for treatment. 1030 Dialysis ended. 1.5 liters of fluid removed. Patient more alert but not very talkative. 46 Very talkative. States her name, where she is, knows her care giver, and her phone number. Request water. States she is very tired. Explained about speech testing her for swallowing. She seemed to understand.[place] Signs) in to visit. Explained NPO status to patient and family. Patient still very groggy and sleepy. PT,OT and swallow evaluation ordered for tomorrow. Patient remains sleepy and AV paced. ?

## 2021-09-02 LAB — CBC
HCT: 33.8 % — ABNORMAL LOW (ref 36.0–46.0)
Hemoglobin: 10.4 g/dL — ABNORMAL LOW (ref 12.0–15.0)
MCH: 29 pg (ref 26.0–34.0)
MCHC: 30.8 g/dL (ref 30.0–36.0)
MCV: 94.2 fL (ref 80.0–100.0)
Platelets: 104 10*3/uL — ABNORMAL LOW (ref 150–400)
RBC: 3.59 MIL/uL — ABNORMAL LOW (ref 3.87–5.11)
RDW: 15.9 % — ABNORMAL HIGH (ref 11.5–15.5)
WBC: 13 10*3/uL — ABNORMAL HIGH (ref 4.0–10.5)
nRBC: 0.3 % — ABNORMAL HIGH (ref 0.0–0.2)

## 2021-09-02 LAB — GLUCOSE, CAPILLARY
Glucose-Capillary: 100 mg/dL — ABNORMAL HIGH (ref 70–99)
Glucose-Capillary: 107 mg/dL — ABNORMAL HIGH (ref 70–99)
Glucose-Capillary: 109 mg/dL — ABNORMAL HIGH (ref 70–99)
Glucose-Capillary: 118 mg/dL — ABNORMAL HIGH (ref 70–99)
Glucose-Capillary: 118 mg/dL — ABNORMAL HIGH (ref 70–99)
Glucose-Capillary: 129 mg/dL — ABNORMAL HIGH (ref 70–99)
Glucose-Capillary: 139 mg/dL — ABNORMAL HIGH (ref 70–99)
Glucose-Capillary: 373 mg/dL — ABNORMAL HIGH (ref 70–99)
Glucose-Capillary: 91 mg/dL (ref 70–99)
Glucose-Capillary: 96 mg/dL (ref 70–99)

## 2021-09-02 LAB — RENAL FUNCTION PANEL
Albumin: 2.4 g/dL — ABNORMAL LOW (ref 3.5–5.0)
Anion gap: 7 (ref 5–15)
BUN: 60 mg/dL — ABNORMAL HIGH (ref 8–23)
CO2: 29 mmol/L (ref 22–32)
Calcium: 7.9 mg/dL — ABNORMAL LOW (ref 8.9–10.3)
Chloride: 109 mmol/L (ref 98–111)
Creatinine, Ser: 1.98 mg/dL — ABNORMAL HIGH (ref 0.44–1.00)
GFR, Estimated: 27 mL/min — ABNORMAL LOW (ref >60)
Glucose, Bld: 118 mg/dL — ABNORMAL HIGH (ref 70–99)
Phosphorus: 3.7 mg/dL (ref 2.5–4.6)
Potassium: 3.8 mmol/L (ref 3.5–5.1)
Sodium: 145 mmol/L (ref 135–145)

## 2021-09-02 LAB — MAGNESIUM: Magnesium: 2.4 mg/dL (ref 1.7–2.4)

## 2021-09-02 LAB — HEPARIN LEVEL (UNFRACTIONATED): Heparin Unfractionated: 0.45 IU/mL (ref 0.30–0.70)

## 2021-09-02 MED ORDER — TORSEMIDE 20 MG PO TABS
40.0000 mg | ORAL_TABLET | Freq: Every day | ORAL | Status: DC
  Administered 2021-09-02 – 2021-09-06 (×5): 40 mg via ORAL
  Filled 2021-09-02 (×5): qty 2

## 2021-09-02 MED ORDER — NEPRO/CARBSTEADY PO LIQD
237.0000 mL | Freq: Three times a day (TID) | ORAL | Status: DC
Start: 2021-09-02 — End: 2021-09-06
  Administered 2021-09-03 – 2021-09-06 (×4): 237 mL via ORAL

## 2021-09-02 MED ORDER — ADULT MULTIVITAMIN W/MINERALS CH
1.0000 | ORAL_TABLET | Freq: Every day | ORAL | Status: DC
  Administered 2021-09-03 – 2021-09-06 (×4): 1 via ORAL
  Filled 2021-09-02 (×4): qty 1

## 2021-09-02 NOTE — Progress Notes (Signed)
[place], [place] 09/02/21  Subjective:   Hospital day # 8   cvs: paced rhythm, off pressors pulm:  now on [place] O2 Working with PT Renal: 03/09 0701 - 03/10 0700 In: 820.1 [I.V.:720.1; IV Piggyback:100] Out: 2650 [Urine:950] Lab Results  Component Value Date   CREATININE 1.98 (H) 09/02/2021   CREATININE 2.16 (H) 09/01/2021   CREATININE 2.06 (H) 08/31/2021   Foley in place very small amount of urine noted. Remains on heparin drip  Net fluid removed 1.5 L yesterday with UF only treatment  Objective:  Vital signs in last 24 hours:  Temp:  [98.1 F (36.7 C)-98.7 F (37.1 C)] 98.4 F (36.9 C) (03/10 0415) Pulse Rate:  [55-69] 62 (03/10 1000) Resp:  [15-26] 26 (03/10 1000) BP: (101-153)/(43-77) 126/66 (03/10 1000) SpO2:  [94 %-100 %] 99 % (03/10 1000) Weight:  [144.5 kg] 144.5 kg (03/10 0500)  Weight change: -0.2 kg Filed Weights   09/01/21 0408 09/01/21 1019 09/02/21 0500  Weight: (!) 151.3 kg (!) 149.8 kg (!) 144.5 kg    Intake/Output:    Intake/Output Summary (Last 24 hours) at 09/02/2021 1121 Last data filed at 09/02/2021 1000 Gross per 24 hour  Intake 854.14 ml  Output 950 ml  Net -95.86 ml      Physical Exam: General: Obese lady, No acute distress  HEENT [place] O2  Pulm/lungs Coarse crackles b/l  CVS/Heart Irregular, paced rhythm  Abdomen:  Soft  Extremities: Some dependent edema  Neurologic: Alert   Access: Left IJ temporary dialysis catheter   Foley in place with small amount of dark urine    Basic Metabolic Panel:  Recent Labs  Lab 08/29/21 0341 08/29/21 1145 08/29/21 1544 08/30/21 0419 08/31/21 0446 09/01/21 0444 09/02/21 0455  NA 136  --  136 138 139 142 145  K 3.9  --  3.9 3.9 3.7 3.7 3.8  CL 101  --  103 103 103 106 109  CO2 25  --  25 24 27 26 29   GLUCOSE 224*  --  203* 218* 195* 154* 118*  BUN 26*  --  29* 40* 54* 62* 60*  CREATININE 1.17*  --  1.21* 1.86* 2.06* 2.16* 1.98*  CALCIUM 8.3*  --  8.2*  8.3* 8.1* 7.8* 7.9*  MG 2.4  --   --  2.3 2.4 2.3 2.4  PHOS 1.7*   < > 2.1* 1.8* 3.4 3.7 3.7   < > = values in this interval not displayed.      CBC: Recent Labs  Lab 08/29/21 0341 08/30/21 0419 08/31/21 0446 09/01/21 0444 09/02/21 0455  WBC 10.6* 11.7* 16.0* 14.8* 13.0*  HGB 11.1* 10.8* 11.1* 10.7* 10.4*  HCT 35.1* 34.3* 35.0* 34.6* 33.8*  MCV 91.2 91.7 91.4 93.3 94.2  PLT 75* 81* 91* 95* 104*       Lab Results  Component Value Date   HEPBSAG NON REACTIVE 08/31/2021   HEPBSAB NON REACTIVE 08/31/2021      Microbiology:  Recent Results (from the past 240 hour(s))  Blood Culture (routine x 2)     Status: Abnormal   Collection Time: 08/25/21 12:29 PM   Specimen: BLOOD RIGHT HAND  Result Value Ref Range Status   Specimen Description   Final    BLOOD RIGHT HAND Performed at [facility], [address] [place] [address] [place], [place] [address]    Special Requests   Final    BOTTLES DRAWN AEROBIC AND ANAEROBIC Blood Culture results may not be optimal due to  an inadequate volume of blood received in culture bottles Performed at [facility], [address] [place], [place] [address]    Culture  Setup Time   Final    GRAM NEGATIVE RODS IN BOTH AEROBIC AND ANAEROBIC BOTTLES CRITICAL RESULT CALLED TO, READ BACK BY AND VERIFIED WITH: MORGAN HICKS AT 2207 ON 08/25/21 BY SS Performed at [facility], [place] [address] [place], [place] [address]    Culture ESCHERICHIA COLI (A)  Final   Report Status 08/28/2021 FINAL  Final   Organism ID, Bacteria ESCHERICHIA COLI  Final      Susceptibility   Escherichia coli - MIC*    AMPICILLIN 8 SENSITIVE Sensitive     CEFAZOLIN <=4 SENSITIVE Sensitive     CEFEPIME <=0.12 SENSITIVE Sensitive     CEFTAZIDIME <=1 SENSITIVE Sensitive     CEFTRIAXONE <=0.25 SENSITIVE Sensitive     CIPROFLOXACIN <=0.25 SENSITIVE Sensitive     GENTAMICIN <=1 SENSITIVE Sensitive     IMIPENEM <=0.25 SENSITIVE Sensitive     TRIMETH/SULFA <=20  SENSITIVE Sensitive     AMPICILLIN/SULBACTAM <=2 SENSITIVE Sensitive     PIP/TAZO <=4 SENSITIVE Sensitive     * ESCHERICHIA COLI  Blood Culture ID Panel (Reflexed)     Status: Abnormal   Collection Time: 08/25/21 12:29 PM  Result Value Ref Range Status   Enterococcus faecalis NOT DETECTED NOT DETECTED Final   Enterococcus Faecium NOT DETECTED NOT DETECTED Final   Listeria monocytogenes NOT DETECTED NOT DETECTED Final   Staphylococcus species NOT DETECTED NOT DETECTED Final   Staphylococcus aureus (BCID) NOT DETECTED NOT DETECTED Final   Staphylococcus epidermidis NOT DETECTED NOT DETECTED Final   Staphylococcus lugdunensis NOT DETECTED NOT DETECTED Final   Streptococcus species NOT DETECTED NOT DETECTED Final   Streptococcus agalactiae NOT DETECTED NOT DETECTED Final   Streptococcus pneumoniae NOT DETECTED NOT DETECTED Final   Streptococcus pyogenes NOT DETECTED NOT DETECTED Final   A.calcoaceticus-baumannii NOT DETECTED NOT DETECTED Final   Bacteroides fragilis NOT DETECTED NOT DETECTED Final   Enterobacterales DETECTED (A) NOT DETECTED Final    Comment: Enterobacterales represent a large order of gram negative bacteria, not a single organism. CRITICAL RESULT CALLED TO, READ BACK BY AND VERIFIED WITH: MORGAN HICKS AT 2207 ON 08/25/21 BY SS    Enterobacter cloacae complex NOT DETECTED NOT DETECTED Final   Escherichia coli DETECTED (A) NOT DETECTED Final    Comment: CRITICAL RESULT CALLED TO, READ BACK BY AND VERIFIED WITH: MORGAN HICKS AT 2207 ON 08/25/21 BY SS    Klebsiella aerogenes NOT DETECTED NOT DETECTED Final   Klebsiella oxytoca NOT DETECTED NOT DETECTED Final   Klebsiella pneumoniae NOT DETECTED NOT DETECTED Final   Proteus species NOT DETECTED NOT DETECTED Final   Salmonella species NOT DETECTED NOT DETECTED Final   Serratia marcescens NOT DETECTED NOT DETECTED Final   Haemophilus influenzae NOT DETECTED NOT DETECTED Final   Neisseria meningitidis NOT DETECTED NOT DETECTED  Final   Pseudomonas aeruginosa NOT DETECTED NOT DETECTED Final   Stenotrophomonas maltophilia NOT DETECTED NOT DETECTED Final   Candida albicans NOT DETECTED NOT DETECTED Final   Candida auris NOT DETECTED NOT DETECTED Final   Candida glabrata NOT DETECTED NOT DETECTED Final   Candida krusei NOT DETECTED NOT DETECTED Final   Candida parapsilosis NOT DETECTED NOT DETECTED Final   Candida tropicalis NOT DETECTED NOT DETECTED Final   Cryptococcus neoformans/gattii NOT DETECTED NOT DETECTED Final   CTX-M ESBL NOT DETECTED NOT DETECTED Final  Carbapenem resistance IMP NOT DETECTED NOT DETECTED Final   Carbapenem resistance KPC NOT DETECTED NOT DETECTED Final   Carbapenem resistance NDM NOT DETECTED NOT DETECTED Final   Carbapenem resist OXA 48 LIKE NOT DETECTED NOT DETECTED Final   Carbapenem resistance VIM NOT DETECTED NOT DETECTED Final    Comment: Performed at [facility], [address] [place], [place] [address]  Urine Culture     Status: Abnormal   Collection Time: 08/25/21  1:47 PM   Specimen: In/Out Cath Urine  Result Value Ref Range Status   Specimen Description   Final    IN/OUT CATH URINE Performed at [facility], [address] [place], [place] [address]    Special Requests   Final    NONE Performed at [facility], [address] [place], [place] [address]    Culture >=100,000 COLONIES/mL ESCHERICHIA COLI (A)  Final   Report Status 08/28/2021 FINAL  Final   Organism ID, Bacteria ESCHERICHIA COLI (A)  Final      Susceptibility   Escherichia coli - MIC*    AMPICILLIN 8 SENSITIVE Sensitive     CEFAZOLIN <=4 SENSITIVE Sensitive     CEFEPIME <=0.12 SENSITIVE Sensitive     CEFTRIAXONE <=0.25 SENSITIVE Sensitive     CIPROFLOXACIN <=0.25 SENSITIVE Sensitive     GENTAMICIN <=1 SENSITIVE Sensitive     IMIPENEM <=0.25 SENSITIVE Sensitive     NITROFURANTOIN <=16 SENSITIVE Sensitive     TRIMETH/SULFA <=20 SENSITIVE Sensitive      AMPICILLIN/SULBACTAM <=2 SENSITIVE Sensitive     PIP/TAZO <=4 SENSITIVE Sensitive     * >=100,000 COLONIES/mL ESCHERICHIA COLI  Resp Panel by RT-PCR (Flu A&B, Covid) Nasopharyngeal Swab     Status: None   Collection Time: 08/25/21  3:05 PM   Specimen: Nasopharyngeal Swab; Nasopharyngeal(NP) swabs in vial transport medium  Result Value Ref Range Status   SARS Coronavirus 2 by RT PCR NEGATIVE NEGATIVE Final    Comment: (NOTE) SARS-CoV-2 target nucleic acids are NOT DETECTED.  The SARS-CoV-2 RNA is generally detectable in upper respiratory specimens during the acute phase of infection. The lowest concentration of SARS-CoV-2 viral copies this assay can detect is 138 copies/mL. A negative result does not preclude SARS-Cov-2 infection and should not be used as the sole basis for treatment or other patient management decisions. A negative result may occur with  improper specimen collection/handling, submission of specimen other than nasopharyngeal swab, presence of viral mutation(s) within the areas targeted by this assay, and inadequate number of viral copies(<138 copies/mL). A negative result must be combined with clinical observations, patient history, and epidemiological information. The expected result is Negative.  Fact Sheet for Patients:  EntrepreneurPulse.com.au  Fact Sheet for Healthcare Providers:  IncredibleEmployment.be  This test is no t yet approved or cleared by the [place] FDA and  has been authorized for detection and/or diagnosis of SARS-CoV-2 by FDA under an Emergency Use Authorization (EUA). This EUA will remain  in effect (meaning this test can be used) for the duration of the COVID-19 declaration under Section 564(b)(1) of the Act, 21 U.S.C.section 360bbb-3(b)(1), unless the authorization is terminated  or revoked sooner.       Influenza A by PCR NEGATIVE NEGATIVE Final   Influenza B by PCR NEGATIVE NEGATIVE Final     Comment: (NOTE) The Xpert Xpress SARS-CoV-2/FLU/RSV plus assay is intended as an aid in the diagnosis of influenza from Nasopharyngeal swab specimens and should not be used as a sole basis for  treatment. Nasal washings and aspirates are unacceptable for Xpert Xpress SARS-CoV-2/FLU/RSV testing.  Fact Sheet for Patients: EntrepreneurPulse.com.au  Fact Sheet for Healthcare Providers: IncredibleEmployment.be  This test is not yet approved or cleared by the [place] FDA and has been authorized for detection and/or diagnosis of SARS-CoV-2 by FDA under an Emergency Use Authorization (EUA). This EUA will remain in effect (meaning this test can be used) for the duration of the COVID-19 declaration under Section 564(b)(1) of the Act, 21 U.S.C. section 360bbb-3(b)(1), unless the authorization is terminated or revoked.  Performed at [facility], [address] [place], [place] [address]   MRSA Next Gen by PCR, Nasal     Status: None   Collection Time: 08/25/21  6:44 PM   Specimen: Nasal Mucosa; Nasal Swab  Result Value Ref Range Status   MRSA by PCR Next Gen NOT DETECTED NOT DETECTED Final    Comment: (NOTE) The GeneXpert MRSA Assay (FDA approved for NASAL specimens only), is one component of a comprehensive MRSA colonization surveillance program. It is not intended to diagnose MRSA infection nor to guide or monitor treatment for MRSA infections. Test performance is not FDA approved in patients less than 73 years old. Performed at [facility], [address] [place], [place] [address]   Blood Culture (routine x 2)     Status: None   Collection Time: 08/25/21  7:38 PM   Specimen: BLOOD  Result Value Ref Range Status   Specimen Description BLOOD RIGHT HAND  Final   Special Requests   Final    BOTTLES DRAWN AEROBIC AND ANAEROBIC Blood Culture adequate volume   Culture   Final    NO GROWTH 5 DAYS Performed at [facility], [address] [place], [place] [address]    Report Status 08/30/2021 FINAL  Final  Culture, Respiratory w Gram Stain     Status: None   Collection Time: 08/26/21  8:21 AM   Specimen: Tracheal Aspirate; Respiratory  Result Value Ref Range Status   Specimen Description   Final    TRACHEAL ASPIRATE Performed at [facility], [address], [place], [place] [address]    Special Requests   Final    NONE Performed at [facility], [address] [place], [place] [address]    Gram Stain   Final    RARE SQUAMOUS EPITHELIAL CELLS PRESENT RARE WBC PRESENT, PREDOMINANTLY MONONUCLEAR NO ORGANISMS SEEN    Culture   Final    RARE Normal respiratory flora-no Staph aureus or Pseudomonas seen Performed at [facility], [address] [place], [place] [address]    Report Status 08/29/2021 FINAL  Final    Coagulation Studies: No results for input(s): LABPROT, INR in the last 72 hours.   Urinalysis: No results for input(s): COLORURINE, LABSPEC, PHURINE, GLUCOSEU, HGBUR, BILIRUBINUR, KETONESUR, PROTEINUR, UROBILINOGEN, NITRITE, LEUKOCYTESUR in the last 72 hours.  Invalid input(s): APPERANCEUR    Imaging: CT HEAD WO CONTRAST (5MM)  Result Date: 08/31/2021 CLINICAL DATA:  Mental status change, unknown cause EXAM: CT HEAD WITHOUT CONTRAST TECHNIQUE: Contiguous axial images were obtained from the base of the skull through the vertex without intravenous contrast. RADIATION DOSE REDUCTION: This exam was performed according to the departmental dose-optimization program which includes automated exposure control, adjustment of the mA and/or kV according to patient size and/or use of iterative reconstruction technique. COMPARISON:  None. FINDINGS: Brain: No evidence of acute infarction, hemorrhage, hydrocephalus, extra-axial collection or mass lesion/mass effect. Scattered low-density  changes within the periventricular and subcortical white matter compatible with  chronic microvascular ischemic change. Mild-moderate diffuse cerebral volume loss. Vascular: No hyperdense vessel or unexpected calcification. Skull: Normal. Negative for fracture or focal lesion. Sinuses/Orbits: No acute finding. Other: None. IMPRESSION: 1. No acute intracranial abnormality. 2. Chronic microvascular ischemic change and cerebral volume loss. Electronically Signed   By: Davina Poke D.O.   On: 08/31/2021 14:11     Medications:    sodium chloride 10 mL/hr at 09/01/21 1900   cefTRIAXone (ROCEPHIN)  IV Stopped (09/01/21 2241)   heparin 1,800 Units/hr (09/02/21 1000)    chlorhexidine gluconate (MEDLINE KIT)  15 mL Mouth Rinse BID   Chlorhexidine Gluconate Cloth  6 each Topical Q0600   docusate  100 mg Per Tube BID   insulin aspart  0-20 Units Subcutaneous Q4H   insulin glargine-yfgn  20 Units Subcutaneous Daily   mouth rinse  15 mL Mouth Rinse q12n4p   pantoprazole (PROTONIX) IV  40 mg Intravenous Daily   polyethylene glycol  17 g Per Tube Daily   sodium chloride flush  10-40 mL Intracatheter Q12H   sodium chloride, acetaminophen, heparin sodium (porcine), heparin sodium (porcine), ipratropium-albuterol, midazolam, ondansetron **OR** ondansetron (ZOFRAN) IV, sodium chloride flush  Assessment/ Plan:  71 y.o. 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  Admit creatinine of 2.31 that peaked at 4.47  03/09 0701 - 03/10 0700 In: 820.1 [I.V.:720.1; IV Piggyback:100] Out: 2650 [Urine:950]    CRRT was  discontinued on March 6. UF only treatment on 3/9 with iv albumin. Net UF 1.5 L Start Oral diuretic and monitor Torsemide 40 mg daily    #Acute respiratory failure Extubated on March 6 required NIPPV Now on [place] o2  # Sepsis with UTI, hypotension, shock E Coli Pyelonephritis and bacteremia # Nephrolithiasis with rt proximal stone with hydronephrosis and left UPJ stone without hydronephrosis. On 3/2, patient underwent cystoscopy with bilateral ureteral stents placed by Dr. Diamantina Providence. Patient was found to have purulent urine in bladder. Plan for outpatient b/l uretereroscopy/laser lithotripsy in 1-2 months Small stone retrieved by nursing.  Sent for analysis.      LOS: [place] 3/10/202311:21 [place], [place]  Note: This note was prepared with Dragon dictation. Any transcription errors are unintentional

## 2021-09-02 NOTE — Progress Notes (Signed)
Nutrition Follow-up ? ?DOCUMENTATION CODES:  ? ?Morbid obesity ? ?INTERVENTION:  ? ?-Nepro Shake po TID, each supplement provides 425 kcal and 19 grams protein  ?-MVI with minerals daily ? ?NUTRITION DIAGNOSIS:  ? ?Inadequate oral intake related to inability to eat (pt sedated and ventilated) as evidenced by NPO status. ? ?Ongoing ? ?GOAL:  ? ?Patient will meet greater than or equal to 90% of their needs ? ?Progressing; advanced to PO diet on 09/02/21 ? ?MONITOR:  ? ?Diet advancement, Labs, Weight trends, Skin, I & O's ? ?REASON FOR ASSESSMENT:  ? ?Ventilator ?  ? ?ASSESSMENT:  ? ?71 y/o female with h/o HTN, DM, CAD, HLD, CHF, Afib and s/p TAVR who is admitted with obstructing ureteral stone, AKI and sepsis. Pt s/p cystoscopy and bilateral ureteral stent placement 3/2. ? ?3/6- extubated, CRRT d/c ?3/10- s/p BSE- advanced to dysphagia 1 diet with nectar thick liquids ? ?Reviewed I/O's: -1.8 L x 24 hours and -768 ml since admission ? ?UOP: 950 ml x 24 hours  ? ?Case discussed with RN, MD, and during ICU rounds. Per RN, pt's mental status has improved since yesterday. Plan for SLP eval today; noted pt diet has been advanced to dysphagia 1 diet with nectar thick liquids.  ? ?Medications reviewed and include colace, miralax, and demadex.  ? ?Labs reviewed: CBGS: 96-129 (inpatient orders for glycemic control are 0-20 units insulin aspart every 4 hours and 20 units insulin glargine-yfgn daily).   ? ?Diet Order:   ?Diet Order   ? ?       ?  DIET - DYS 1 Room service appropriate? Yes; Fluid consistency: Nectar Thick  Diet effective now       ?  ? ?  ?  ? ?  ? ? ?EDUCATION NEEDS:  ? ?No education needs have been identified at this time ? ?Skin:  Skin Assessment: Skin Integrity Issues: ?Skin Integrity Issues:: Other (Comment) ?Other: closed perineum, MASD lt groin, skin tear to rt upper back ? ?Last BM:  Unknown ? ?Height:  ? ?Ht Readings from Last 1 Encounters:  ?08/26/21 5\' 5"  (1.651 m)  ? ? ?Weight:  ? ?Wt Readings from Last  1 Encounters:  ?09/02/21 (!) 144.5 kg  ? ? ?Ideal Body Weight:  56.8 kg ? ?BMI:  Body mass index is 53.01 kg/m?. ? ?Estimated Nutritional Needs:  ? ?Kcal:  2500-2800kcal/day ? ?Protein:  >125g/day ? ?Fluid:  1.4-1.7L/day ? ? ? ?Loistine Chance, RD, LDN, CDCES ?Registered Dietitian II ?Certified Diabetes Care and Education Specialist ?Please refer to [facility] for RD and/or RD on-call/weekend/after hours pager  ?

## 2021-09-02 NOTE — Evaluation (Signed)
Occupational Therapy Evaluation Patient Details Name: [name] MRN: [id number] DOB: 09-13-1950 Today's Date: 09/02/2021   History of Present Illness 71 y.o. female with medical history significant for morbid obesity (BMI 38), obesity hypoventilation syndrome, diabetes mellitus, coronary artery disease, hypertension, obstructive sleep apnea who presents to the emergency room via EMS for evaluation of change in mental status secondary to sepsis and UTI.   Clinical Impression   Patient presenting with decreased Ind in self care,balance, functional mobility/transfers, endurance, and safety awareness. Patient reports living at home alone with person care attendant. Unsure of how much assistance aide provides or how often she comes because pt is unable to verbalize. Pt seen for skilled co-eval with PT and pt needing +2 assistance for bed mobility. She was able to maintain static sitting balance with close supervision - min guard for ~ 10 minutes. Attempts made to stand but pt unable to clear buttocks from bed.  Patient will benefit from acute OT to increase overall independence in the areas of ADLs, functional mobility, and safety awareness in order to safely discharge to next venue of care.      Recommendations for follow up therapy are one component of a multi-disciplinary discharge planning process, led by the attending physician.  Recommendations may be updated based on patient status, additional functional criteria and insurance authorization.   Follow Up Recommendations  Skilled nursing-short term rehab (<3 hours/day)    Assistance Recommended at Discharge Frequent or constant Supervision/Assistance  Patient can return home with the following Two people to help with walking and/or transfers;Two people to help with bathing/dressing/bathroom;Assistance with cooking/housework;Help with stairs or ramp for entrance;Assist for transportation;Direct supervision/assist for medications  management;Direct supervision/assist for financial management    Functional Status Assessment  Patient has had a recent decline in their functional status and demonstrates the ability to make significant improvements in function in a reasonable and predictable amount of time.  Equipment Recommendations  Other (comment) (defer to next venue of care)       Precautions / Restrictions Precautions Precautions: Fall Restrictions Weight Bearing Restrictions: No      Mobility Bed Mobility Overal bed mobility: Needs Assistance Bed Mobility: Supine to Sit, Sit to Supine     Supine to sit: Max assist, +2 for physical assistance, +2 for safety/equipment Sit to supine: Max assist, +2 for physical assistance, +2 for safety/equipment   General bed mobility comments: Pt giving good effort but heavy assist needed for task secondary to weakness    Transfers                   General transfer comment: Pt unable to clear buttocks from bed with total A of 2 and use of bari RW      Balance Overall balance assessment: Needs assistance Sitting-balance support: Feet unsupported, Single extremity supported Sitting balance-Leahy Scale: Fair                                     ADL either performed or assessed with clinical judgement   ADL Overall ADL's : Needs assistance/impaired     Grooming: Wash/dry hands;Wash/dry face;Supervision/safety;Set up                                 General ADL Comments: Pt fatigues very quickly with tasks. She was unable to clear buttocks with +2 assist from  EOB. Pt likely needs mod A for UB self care and total A for LB self care.     Vision Baseline Vision/History: 1 Wears glasses Patient Visual Report: No change from baseline              Pertinent Vitals/Pain Pain Assessment Pain Assessment: Faces Faces Pain Scale: Hurts little more Pain Location: B LEs Pain Descriptors / Indicators: Discomfort, Grimacing Pain  Intervention(s): Monitored during session, Repositioned     Hand Dominance Right   Extremity/Trunk Assessment Upper Extremity Assessment Upper Extremity Assessment: Generalized weakness   Lower Extremity Assessment Lower Extremity Assessment: Defer to PT evaluation       Communication Communication Communication: No difficulties   Cognition Arousal/Alertness: Awake/alert Behavior During Therapy: Flat affect Overall Cognitive Status: No family/caregiver present to determine baseline cognitive functioning                                 General Comments: Pt is oriented to self, month, and location. Pt needing increased time and cuing for initiation and sequencing. She was more lethargic at beginning of session but became more alert once seated on EOB.                Home Living Family/patient expects to be discharged to:: Private residence Living Arrangements: Alone Available Help at Discharge: Personal care attendant;Available PRN/intermittently Type of Home: Apartment Home Access: Stairs to enter Entrance Stairs-Number of Steps: 2 Entrance Stairs-Rails: None Home Layout: One level     Bathroom Shower/Tub: Teacher, early years/pre: Standard     Home Equipment: Rollator (4 wheels)          Prior Functioning/Environment               Mobility Comments: Pt reports ambulation with rollator and use of seat to rest as needed. She uses facility transport chair for appointment's requiring more than 100' of ambulation. ADLs Comments: Pt reports PCA assists with IADLs and ADLs. Unsure of how often and for how long.        OT Problem List: Decreased strength;Cardiopulmonary status limiting activity;Pain;Decreased activity tolerance;Decreased safety awareness;Impaired balance (sitting and/or standing);Decreased knowledge of use of DME or AE;Decreased knowledge of precautions      OT Treatment/Interventions: Self-care/ADL  training;Therapeutic exercise;Therapeutic activities;DME and/or AE instruction;Manual therapy;Balance training;Patient/family education;Energy conservation    OT Goals(Current goals can be found in the care plan section) Acute Rehab OT Goals Patient Stated Goal: "to get water, to walk, and breathe better" OT Goal Formulation: With patient Time For Goal Achievement: 09/16/21 Potential to Achieve Goals: Good ADL Goals Pt Will Perform Grooming: sitting;with set-up;with supervision Pt Will Perform Lower Body Dressing: with mod assist;sit to/from stand Pt Will Transfer to Toilet: with mod assist;bedside commode Pt Will Perform Toileting - Clothing Manipulation and hygiene: with mod assist;sit to/from stand  OT Frequency: Min 2X/week    Co-evaluation PT/OT/SLP Co-Evaluation/Treatment: Yes Reason for Co-Treatment: Complexity of the patient's impairments (multi-system involvement);For patient/therapist safety;To address functional/ADL transfers PT goals addressed during session: Mobility/safety with mobility;Balance OT goals addressed during session: ADL's and self-care      AM-PAC OT "6 Clicks" Daily Activity     Outcome Measure Help from another person eating meals?: None Help from another person taking care of personal grooming?: A Little Help from another person toileting, which includes using toliet, bedpan, or urinal?: Total Help from another person bathing (including washing, rinsing, drying)?: A Lot Help  from another person to put on and taking off regular upper body clothing?: A Little Help from another person to put on and taking off regular lower body clothing?: Total 6 Click Score: 14   End of Session Equipment Utilized During Treatment: Rolling walker (2 wheels);Oxygen Nurse Communication: Mobility status;Other (comment) (oxygen)  Activity Tolerance: Patient tolerated treatment well;Patient limited by fatigue Patient left: in bed;with bed alarm set;with call bell/phone within  reach  OT Visit Diagnosis: Unsteadiness on feet (R26.81);Muscle weakness (generalized) (M62.81)                Time: 2890-2284 OT Time Calculation (min): 37 min Charges:  OT General Charges $OT Visit: 1 Visit OT Evaluation $OT Eval Moderate Complexity: 1 Mod OT Treatments $Self Care/Home Management : 8-22 mins  Darleen Crocker, MS, OTR/L , CBIS ascom [phone]  09/02/21, 11:19 AM

## 2021-09-02 NOTE — Evaluation (Signed)
Clinical/Bedside Swallow Evaluation ?Patient Details  ?Name: [name] ?MRN: [id number] ?Date of Birth: Jul 30, 1950 ? ?Today's Date: 09/02/2021 ?Time: SLP Start Time (ACUTE ONLY): 1324 SLP Stop Time (ACUTE ONLY): 4010 ?SLP Time Calculation (min) (ACUTE ONLY): 63 min ? ?Past Medical History:  ?Past Medical History:  ?Diagnosis Date  ? Bacteremia   ? CAD (coronary artery disease)   ? Diabetes mellitus without complication ([place])   ? Diastolic CHF ([place])   ? Hyperlipemia   ? Hypertension   ? Lymphedema   ? MI (myocardial infarction) ([place])   ? OSA (obstructive sleep apnea)   ? Severe aortic stenosis   ? ?Past Surgical History:  ?Past Surgical History:  ?Procedure Laterality Date  ? CARDIAC SURGERY    ? CYSTOSCOPY W/ URETERAL STENT PLACEMENT  08/25/2021  ? Procedure: CYSTOSCOPY WITH RETROGRADE PYELOGRAM/URETERAL STENT PLACEMENT;  Surgeon: Billey Co, MD;  Location: [facility] ORS;  Service: Urology;;  ? none    ? PACEMAKER INSERTION    ? ?HPI:  ?Per admitting H&P "[name] is a 71 y.o. female with medical history significant for morbid obesity (BMI 57), obesity hypoventilation syndrome, diabetes mellitus, coronary artery disease, hypertension, obstructive sleep apnea who presents to the emergency room via EMS for evaluation of change in mental status.  Patient is unable to provide any history and continues to moan out in pain asking for help.  Home health had called 911 because patient was altered.  Vital signs per EMS showed a Tmax of 98.1, tachycardia with heart rate of 124, respiratory rate 27, room air pulse oximetry of 86% (she was initially on 6 L of oxygen but has been weaned down to 2 L, blood pressure 90/70.  In the ER she received 2.5 L of LR with improvement in her blood pressure.  Labs show worsening of her renal function from baseline, she has a serum creatinine of 2.31 compared to 1.6 with a potassium of 3.4.  Lactic acid was 4.3, white count normal at 8.9.  I am unable to do review of systems on  this patient due to her mental status.  She received a dose of IV vancomycin, cefepime and Flagyl."                                                                                                  Pt underwent surgery and was not able to be extubated until for a few days.  ?  ?Assessment / Plan / Recommendation  ?Clinical Impression ? Pt presents with mild to moderate dysphagia at bedside. Pt was awake and able to follow directions but continued to close her eyes throughout assessement. Overall oral motor weakness noted. Pt. has lower natural teeth but no upper teeth. She reports her upper dentures are probably at home. Given ice chips, noted throat clear for first few attempts but tolerated later attempts well. Cough and throat clear also noted inconsistently with thin water given in small amounts by straw. Pt tolerated nectar thick liquids well. Mild oral transit delay with applesauce boluses. Rec trial of Dys 1 with nectar htick liquids. Meds crused  in applesauce. Pt may have ice chips in moderation. ST to follow up with toleration of diet and reassess with thin liquids. Pt must be seated fully upright. Will need assist with positioning secondary to weakness and obesity. Pt will also need to be fed until strength improves enough to feed herself safely. Pt instructed on recs and agrees to make sure she is seated upright and fed slowly. ?SLP Visit Diagnosis: Dysphagia, oropharyngeal phase (R13.12) ?   ?Aspiration Risk ? Moderate aspiration risk  ?  ?Diet Recommendation Dysphagia 1 (Puree);Nectar-thick liquid  ? ?Liquid Administration via: Cup ?Medication Administration: Crushed with puree ?Supervision: Staff to assist with self feeding;Full supervision/cueing for compensatory strategies ?Compensations: Minimize environmental distractions;Slow rate;Small sips/bites ?Postural Changes: Seated upright at 90 degrees;Remain upright for at least 30 minutes after po intake  ?  ?Other  Recommendations Oral Care  Recommendations: Oral care BID;Oral care prior to ice chip/H20   ? ?Recommendations for follow up therapy are one component of a multi-disciplinary discharge planning process, led by the attending physician.  Recommendations may be updated based on patient status, additional functional criteria and insurance authorization. ? ?Follow up Recommendations Skilled nursing-short term rehab (<3 hours/day)  ? ? ?  ?Assistance Recommended at Discharge Frequent or constant Supervision/Assistance  ?Functional Status Assessment Patient has had a recent decline in their functional status and demonstrates the ability to make significant improvements in function in a reasonable and predictable amount of time.  ?Frequency and Duration min 3x week  ?  ?  ?   ? ?Prognosis Prognosis for Safe Diet Advancement: Good  ? ?  ? ?Swallow Study   ?General Date of Onset: 08/26/21 ?HPI: Per admitting H&P "[name] is a 71 y.o. female with medical history significant for morbid obesity (BMI 57), obesity hypoventilation syndrome, diabetes mellitus, coronary artery disease, hypertension, obstructive sleep apnea who presents to the emergency room via EMS for evaluation of change in mental status.  Patient is unable to provide any history and continues to moan out in pain asking for help.  Home health had called 911 because patient was altered.  Vital signs per EMS showed a Tmax of 98.1, tachycardia with heart rate of 124, respiratory rate 27, room air pulse oximetry of 86% (she was initially on 6 L of oxygen but has been weaned down to 2 L, blood pressure 90/70.  In the ER she received 2.5 L of LR with improvement in her blood pressure.  Labs show worsening of her renal function from baseline, she has a serum creatinine of 2.31 compared to 1.6 with a potassium of 3.4.  Lactic acid was 4.3, white count normal at 8.9.  I am unable to do review of systems on this patient due to her mental status.  She received a dose of IV vancomycin, cefepime  and Flagyl."                                                                                                  Pt underwent surgery and was not able to be extubated until for a few days. ?Type of Study:  Bedside Swallow Evaluation ?Previous Swallow Assessment: N/A ?Diet Prior to this Study: NPO ?Respiratory Status: Nasal cannula ?History of Recent Intubation: Yes ?Length of Intubations (days): 2 days ?Date extubated: 08/29/21 ?Behavior/Cognition: Cooperative;Pleasant mood ?Oral Cavity Assessment: Within Functional Limits ?Oral Cavity - Dentition: Dentures, not available;Other (Comment) (Has lower teeth) ?Self-Feeding Abilities: Total assist ?Patient Positioning: Upright in bed ?Baseline Vocal Quality: Low vocal intensity ?Volitional Cough: Strong  ?  ?Oral/Motor/Sensory Function Overall Oral Motor/Sensory Function: Within functional limits   ?Ice Chips Ice chips:  (Occasional throat clear)   ?Thin Liquid Thin Liquid: Impaired ?Presentation: Spoon;Straw ?Pharyngeal  Phase Impairments: Cough - Delayed;Throat Clearing - Delayed  ?  ?Nectar Thick Nectar Thick Liquid: Within functional limits ?Presentation: Cup;Spoon   ?Honey Thick Honey Thick Liquid: Not tested   ?Puree Puree: Impaired ?Presentation: Spoon ?Oral Phase Functional Implications: Prolonged oral transit   ?Solid ? ? ?  Solid: Not tested  ? ?  ? ?Lucila Maine ?09/02/2021,1:43 PM ? ? ? ?

## 2021-09-02 NOTE — Consult Note (Signed)
PHARMACY CONSULT NOTE - FOLLOW UP ? ?Pharmacy Consult for Electrolyte Monitoring and Replacement  ? ?Recent Labs: ?Potassium (mmol/L)  ?Date Value  ?09/02/2021 3.8  ? ?Magnesium (mg/dL)  ?Date Value  ?09/02/2021 2.4  ? ?Calcium (mg/dL)  ?Date Value  ?09/02/2021 7.9 (L)  ? ?Albumin (g/dL)  ?Date Value  ?09/02/2021 2.4 (L)  ? ?Phosphorus (mg/dL)  ?Date Value  ?09/02/2021 3.7  ? ?Sodium (mmol/L)  ?Date Value  ?09/02/2021 145  ? ? ? ?Assessment: ?71 yo F w/ h/o HFpEF, Severe AS, CAD (AC-paced), HTN, BMI 57, DM, severe urosepsis with septic shock ISO Rt prox ureteral stone w/ hydronephrosis & large Lt renal stone c/b AKI, acute metabolic encephalopathy. S/p cytoscopy B/L ureteral stents on 3/02. Pharmacy consulted for the mgmt of electrolytes. ? ?Goal of Therapy:  ?Lytes WNL ? ?Plan:  ?Scr 1.17>1.86>2.06>2.16>1/98;  ?Current Lytes are stable WNL, -763mL; UOP 0.8>0.46ml/k/h. Will CTM and replace as needed. ?CTM daily BMP, Mg, Phos ? ?Lorna Dibble, PharmD, BCCP ?Clinical Pharmacist ?09/02/2021 11:12 AM ? ? ?

## 2021-09-02 NOTE — Consult Note (Signed)
ANTICOAGULATION CONSULT NOTE ? ?Pharmacy Consult for Heparin gtt (PTA Eliquis) ?Indication: atrial fibrillation ? ?Patient Measurements: ?Height: 5\' 5"  (165.1 cm) ?Weight: (!) 149.8 kg (330 lb 4 oz) ?IBW/kg (Calculated) : 57 ?Heparin Dosing Weight: 96.5kg ? ?Vital Signs: ?Temp: 98.4 ?F (36.9 ?C) (03/10 0415) ?Temp Source: Oral (03/10 0415) ?BP: 108/54 (03/10 0415) ?Pulse Rate: 61 (03/10 0415) ? ?Labs: ?Recent Labs  ?  08/31/21 ?0446 09/01/21 ?0444 09/02/21 ?0455  ?HGB 11.1* 10.7* 10.4*  ?HCT 35.0* 34.6* 33.8*  ?PLT 91* 95* 104*  ?HEPARINUNFRC 0.41 0.38 0.45  ?CREATININE 2.06* 2.16* 1.98*  ? ? ? ?Estimated Creatinine Clearance: 39.3 mL/min (A) (by C-G formula based on SCr of 1.98 mg/dL (H)). ? ? ?Medical History: ?Past Medical History:  ?Diagnosis Date  ? Bacteremia   ? CAD (coronary artery disease)   ? Diabetes mellitus without complication ([place])   ? Diastolic CHF ([place])   ? Hyperlipemia   ? Hypertension   ? Lymphedema   ? MI (myocardial infarction) ([place])   ? OSA (obstructive sleep apnea)   ? Severe aortic stenosis   ? ? ?Medications: No AC/APT pertinent drug allergies ?Heparin Dosing Weight: 96.5kg ?PTA: Apixaban 5mg  BID (last dose on 3/02 AM PTA >> admitted on 3/02 - 1344) ?Inpatient: Apixaban (stopped w/o receiving any doses) >> Heparin gtt. ? ?Assessment: ?71yo F w/ h/o  HFpEF, severe AS (s/p TAVR), morbid obesity (BMI 57), obesity hypoventilation syndrome, OSA, DM, CAD, & HTN who presented to [facility] ED on 08/25/2021 due to complaints of altered mental 2/2 severe urosepsis and septic shock ISO Rt ureteral stone, AKI, ac metabolic enceph. Pharmacy consulted for conversion of DOAC to Heparin gtt. Since admission her platelets have been trending down from a low baseline. A decision was made to continue heparin but lower the therapeutic goal range ?   ? ?Goal of Therapy:  ?Heparin level 0.3-0.5 units/ml ?aPTT 66-85 seconds ?Monitor platelets by anticoagulation protocol: Yes ? ?3/7 0015 aPTT 59, subtherapeutic, HL 0.43  therapeutic ?3/7 0419 HL 0.44 therapeutic, aPTT 61 ?3/8 0446 HL 0.41, therapeutic x 3  ?3/9 0444 HL 0.38, therapeutic x 4 ?3/10 0455 HL 0.45, therapeutic x 5 ?  ?Plan:  ?Continue heparin infusion rate at 1800 units/hr. ?Recheck HL daily w/ AM labs while therapeutic  ?Daily CBC while on IV heparin ? ?Renda Rolls, PharmD, MBA ?09/02/2021 ?6:06 AM ? ? ? ? ? ? ?

## 2021-09-02 NOTE — Plan of Care (Signed)
Pt more awake today, talked on phone. Asking for water and ice. Swallow eval done and pt diet advanced to dysphagia. Tolerated a small amount of dysphagia consistency lunch. VSS.  Foley intact, output 450 out so far this shift. O2 sat 98% on 2 LNC. Per PT, sats at 90% on RA with session; O2 reapplied. Heparin infusion continues at 1800 units per hour. V-Paced rhythm on monitor.  ? ?Problem: Education: ?Goal: Knowledge of General Education information will improve ?Description: Including pain rating scale, medication(s)/side effects and non-pharmacologic comfort measures ?Outcome: Progressing ?  ?Problem: Health Behavior/Discharge Planning: ?Goal: Ability to manage health-related needs will improve ?Outcome: Progressing ?  ?Problem: Clinical Measurements: ?Goal: Ability to maintain clinical measurements within normal limits will improve ?Outcome: Progressing ?Goal: Will remain free from infection ?Outcome: Progressing ?Goal: Diagnostic test results will improve ?Outcome: Progressing ?Goal: Respiratory complications will improve ?Outcome: Progressing ?Goal: Cardiovascular complication will be avoided ?Outcome: Progressing ?  ?Problem: Activity: ?Goal: Risk for activity intolerance will decrease ?Outcome: Progressing ?  ?Problem: Coping: ?Goal: Level of anxiety will decrease ?Outcome: Progressing ?  ?Problem: Elimination: ?Goal: Will not experience complications related to bowel motility ?Outcome: Progressing ?Goal: Will not experience complications related to urinary retention ?Outcome: Progressing ?  ?Problem: Pain Managment: ?Goal: General experience of comfort will improve ?Outcome: Progressing ?  ?Problem: Safety: ?Goal: Ability to remain free from injury will improve ?Outcome: Progressing ?  ?Problem: Skin Integrity: ?Goal: Risk for impaired skin integrity will decrease ?Outcome: Progressing ?  ?

## 2021-09-02 NOTE — Progress Notes (Signed)
NAME:  [name], MRN:  [id number], DOB:  May 21, 1951, LOS: 67 ADMISSION DATE:  08/25/2021, CONSULTATION DATE:  08/25/2021 REFERRING MD:  Dr. Francine Graven, CHIEF COMPLAINT:  Altered Mental Status   Brief Pt Description / Synopsis:  71 y.o. Female admitted with Severe Urosepsis with Septic Shock in the setting of Right proximal ureteral stone with Hydronephrosis & large left renal stone, Acute Kidney Injury, and Acute Metabolic Encephalopathy.  She underwent urgent cystoscopy with bilateral ureteral stent placement.  She returns to ICU post procedure and remains intubated.  History of Present Illness:  [name] is a 71 y.o. female with a past medical history significant for HFpEF, severe aortic stenosis status post TAVR, morbid obesity, obesity hypoventilation syndrome, OSA, diabetes mellitus, coronary artery disease, hypertension who presented to [facility] ED on 08/25/2021 due to complaints of altered mental status.  Patient is currently intubated and sedated following return from the OR and no family is available, therefore history is obtained from chart review.  ED Course: Initial vital signs: Temperature 99.2, respiratory rate 27, pulse , blood pressure  Significant Labs: Potassium 3.4, bicarbonate 22, glucose 182, BUN 46, creatinine 2.31, calcium 8.5, anion gap 10, albumin 2.9, AST 72, ALT 36, total bilirubin 5.7, alkaline phosphatase 45, WBC 8.9, hemoglobin 11.8, hematocrit 37.5, platelets 120, PT 18.5, INR 1.6, lactic acid 4.3 COVID-19 PCR is negative Urinalysis consistent with UTI Imaging: Chest x-ray>>IMPRESSION: Mild right basilar subsegmental atelectasis or infiltrate is noted. CT chest>>IMPRESSION: Bilateral perihilar airspace filling pattern left more than right most consistent with acute edema/ARDS. Pneumonia not excluded, but there is no dense consolidation, lobar distribution or collapse. No effusion. Aortic Atherosclerosis CT abdomen pelvis>>IMPRESSION: Hydronephrosis on the right  due to 2 adjacent 6 mm stones in the proximal right ureter, at about the level of the lower pole of the kidney. Possibility of infection associated with the obstruction does exist. 1 cm stone in the left renal pelvis without evidence of obstruction presently. Chololithiasis without CT evidence of cholecystitis or obstruction. Insignificant 13 mm adrenal adenoma, unchanged since previous exam. Medications administered: 2.5 L of LR boluses, IV cefepime, Flagyl, vancomycin   Hospitalist were asked to admit for further work-up and treatment of severe urosepsis due to right proximal ureter stone with hydronephrosis and large left renal stone.  Urology was consulted.  She was taken urgently to the OR for cystoscopy with bilateral ureteral stent placement.  Anesthesia does report she required vasopressors Intra-Op.  She returns to ICU post procedure and remains intubated.  PCCM consulted for management of mechanical ventilation and critical illness.  08/29/21- patient with improved renal failure on ventilator.  She passed SBT and was liberated from MV.  I called and updated family today  08/30/21- patient improved s/p treatment of E coli bacteremia and AKI.  Plan for HD tommorow.   08/31/21- patient with sepsis from UTI and bacteremia Ecoli . Nephrology for possibe HD  .  Encephalopathy from septic and metabolic encephalopathy. CT head is done due to poor mentation but is normal and ABG istable.    Newer results are available. Click to view them now.      Component 6 d ago  Specimen Description IN/OUT CATH URINE  Performed at [facility], [address], [place], [place] [address]   Special Requests NONE  Performed at [facility], [address] [place], [place] [address]   Culture >=100,000 COLONIES/mL ESCHERICHIA COLI Abnormal    Report Status 08/28/2021 FINAL   Organism ID, Bacteria ESCHERICHIA  COLI Abnormal    Resulting Agency [facility]      09/01/21- patient seems to  have improved sensorium today able to wiggle toes.  She is making urine and its less purulent more amber. Overall improved clinically.  No vasopressor support.     09/02/21- patient clinically improved may optimize for transfer from ICU to step down unit.  Off vasopressors.    Pertinent  Medical History  HFpEF Severe aortic stenosis status post TAVR Coronary artery disease Hypertension Hyperlipidemia Obesity hypoventilation syndrome OSA Morbid obesity Diabetes mellitus  Micro Data:  08/25/2021: SARS-CoV-2 and influenza PCR>> negative 08/25/2021: Blood culture>>+E Coli 08/25/2021: Urine>>+E coli 08/25/2021: Tracheal aspirate>> 08/25/2021: Strep pneumo urinary antigen>>negative 08/25/2021: Legionella urine antigen>>  Antimicrobials:  Cefepime x1 dose 3/2 Flagyl x1 dose 3/2 Vancomycin x1 dose 3/2 Ceftriaxone 3/2>>  Significant Hospital Events: Including procedures, antibiotic start and stop dates in addition to other pertinent events   3/2: Presented to ED, admitted by hospitalist.  Urology consulted, taken emergently to the OR for cystoscopy and bilateral ureteral stent placement.  Returns to ICU postop and remains intubated, PCCM consulted 3/3: worsening shock, added vasopressin; started CRRT 3/4: shock improving; on CRRT; minimal vent settings  Interim History / Subjective:  Shock improving, now off Levophed. Remains on CRRT. On minimal vent settings. Not following commands yet but still working down on sedation. E coli in blood and urine is pan-sensitive.  Objective   Blood pressure 126/66, pulse 62, temperature 98.4 F (36.9 C), temperature source Oral, resp. rate (!) 26, height _0  (1.651 m), weight (!) 144.5 kg, SpO2 99 %.        Intake/Output Summary (Last 24 hours) at 09/02/2021 1104 Last data filed at 09/02/2021 1000 Gross per 24 hour  Intake 854.14 ml  Output 950 ml  Net -95.86 ml    Filed Weights   09/01/21 0408 09/01/21 1019 09/02/21 0500  Weight: (!) 151.3 kg (!)  149.8 kg (!) 144.5 kg    Examination: General: Caucasian female in NAD, intubated and sedated Lungs: decreased at the bases bilaterally, otherwise clear Cardiovascular: Regular rate and rhythm, S1-S2, no murmurs, rubs, gallops Abdomen: Morbidly obese, soft, nontender, nondistended, no guarding rebound tenderness, distant bowel sounds Extremities: No deformities, chronic venous trophic changes to BLE  Resolved Hospital Problem list     Assessment & Plan:   Septic Shock 2/2 E coli pyelonephritis with bacteremia Bilateral ureteral stones with severe left hydronephrosis s/p b/l ureterals stent placement (08/25/21) -Wean pressors as able; off vasopressin, cont Levo -MAP goal >65 -Stress dose steroids until off pressors -Continue ceftriaxone 2 g daily for planned 7 day course  Chronic HFpEF Demand ischemia Paroxsymal Atrial Fibrillation PMHx: Severe aortic stenosis s/p TAVR, CAD, MI, Hypertension, Hyperlipidemia -Continuous cardiac monitoring -Maintain MAP >65 as above -Vasopressors as needed to maintain MAP goal -EF down to 45-50% from 50-55% prior in setting of acute infection -Optimize sepsis and ensure GDMT with Cardiology follow up -Heparin gtt for Afib  Acute Hypoxic Respiratory Failure in the setting of septic shock, multiple metabolic derangements PMHx: OSA -Full vent support, implement lung protective strategies -Plateau pressures less than 30 cm H20 -Wean FiO2 & PEEP as tolerated to maintain O2 sats >92% -Follow intermittent Chest X-ray & ABG as needed -Spontaneous Breathing Trials when respiratory parameters met and mental status permits -Implement VAP Bundle -Prn Bronchodilators  Acute Kidney Injury Anion Gap Metabolic Acidosis in the setting of Lactic Acidosis and AKI -Nephrology consulted -Continue CRRT; goal UF 0 -Avoid nephrotoxins -  Renally dose medications -Strict I/O; monitor UOP  Mild Thrombocytopenia, likely in setting of septic shock -Monitor for S/Sx  of bleeding -Trend CBC -Heparin as above -Transfuse for Hgb <7  Acute Metabolic Encephalopathy Sedation needs in the setting of mechanical ventilation -Maintain a RASS goal of 0 to -1 -Daily wake up assessment, SBT today -Treat sepsis and metabolic derangements as above  Diabetes Mellitus -CBG's q4h; Target range of 140 to 180 -SSI -Follow ICU Hypo/Hyperglycemia protocol   Best Practice (right click and "Reselect all SmartList Selections" daily)   Diet/type: NPO; tube feeds DVT prophylaxis: systemic heparin GI prophylaxis: PPI Lines: Central line, Dialysis Catheter, and yes and it is still needed Foley:  Yes, and it is still needed Code Status:  full code Last date of multidisciplinary goals of care discussion [08/26/21]  Labs   CBC: Recent Labs  Lab 08/29/21 0341 08/30/21 0419 08/31/21 0446 09/01/21 0444 09/02/21 0455  WBC 10.6* 11.7* 16.0* 14.8* 13.0*  HGB 11.1* 10.8* 11.1* 10.7* 10.4*  HCT 35.1* 34.3* 35.0* 34.6* 33.8*  MCV 91.2 91.7 91.4 93.3 94.2  PLT 75* 81* 91* 95* 104*     Basic Metabolic Panel: Recent Labs  Lab 08/29/21 0341 08/29/21 1145 08/29/21 1544 08/30/21 0419 08/31/21 0446 09/01/21 0444 09/02/21 0455  NA 136  --  136 138 139 142 145  K 3.9  --  3.9 3.9 3.7 3.7 3.8  CL 101  --  103 103 103 106 109  CO2 25  --  _0 GLUCOSE 224*  --  203* 218* 195* 154* 118*  BUN 26*  --  29* 40* 54* 62* 60*  CREATININE 1.17*  --  1.21* 1.86* 2.06* 2.16* 1.98*  CALCIUM 8.3*  --  8.2* 8.3* 8.1* 7.8* 7.9*  MG 2.4  --   --  2.3 2.4 2.3 2.4  PHOS 1.7*   < > 2.1* 1.8* 3.4 3.7 3.7   < > = values in this interval not displayed.    GFR: Estimated Creatinine Clearance: 38.4 mL/min (A) (by C-G formula based on SCr of 1.98 mg/dL (H)). Recent Labs  Lab 08/26/21 1310 08/27/21 0415 08/27/21 0800 08/27/21 1456 08/28/21 0453 08/30/21 0419 08/31/21 0446 09/01/21 0444 09/02/21 0455  PROCALCITON  --  79.09  --   --   --   --   --   --   --   WBC  --   20.1*  --   --    < > 11.7* 16.0* 14.8* 13.0*  LATICACIDVEN 3.9*  --  2.8* 2.2*  --   --   --   --   --    < > = values in this interval not displayed.     Liver Function Tests: Recent Labs  Lab 08/29/21 1544 08/30/21 0419 08/31/21 0446 09/01/21 0444 09/02/21 0455  ALBUMIN 2.6* 2.4* 2.2* 2.2* 2.4*    No results for input(s): LIPASE, AMYLASE in the last 168 hours. No results for input(s): AMMONIA in the last 168 hours.  ABG    Component Value Date/Time   PHART 7.47 (H) 08/31/2021 0839   PCO2ART 33 08/31/2021 0839   PO2ART 116 (H) 08/31/2021 0839   HCO3 24.0 08/31/2021 0839   ACIDBASEDEF 0.2 08/29/2021 1627   O2SAT 99.1 08/31/2021 0839      Coagulation Profile: Recent Labs  Lab 08/27/21 0415  INR 1.8*     Cardiac Enzymes: No results for input(s): CKTOTAL, CKMB, CKMBINDEX, TROPONINI in the last 168 hours.  HbA1C: Hgb A1c MFr Bld  Date/Time Value Ref Range Status  08/25/2021 09:11 PM 7.6 (H) 4.8 - 5.6 % Final    Comment:    (NOTE)         Prediabetes: 5.7 - 6.4         Diabetes: >6.4         Glycemic control for adults with diabetes: <7.0   07/12/2017 01:09 AM 6.4 (H) 4.8 - 5.6 % Final    Comment:    (NOTE) Pre diabetes:          5.7%-6.4% Diabetes:              >6.4% Glycemic control for   <7.0% adults with diabetes     CBG: Recent Labs  Lab 09/01/21 1614 09/01/21 1938 09/02/21 0017 09/02/21 0402 09/02/21 0736  GLUCAP 135* 139* 100* 109* 129*     Review of Systems:   Unable to assess due to intubation and sedation   Past Medical History:  She,  has a past medical history of Bacteremia, CAD (coronary artery disease), Diabetes mellitus without complication ([place]), Diastolic CHF ([place]), Hyperlipemia, Hypertension, Lymphedema, MI (myocardial infarction) ([place]), OSA (obstructive sleep apnea), and Severe aortic stenosis.   Surgical History:   Past Surgical History:  Procedure Laterality Date   CARDIAC SURGERY     CYSTOSCOPY W/ URETERAL STENT  PLACEMENT  08/25/2021   Procedure: CYSTOSCOPY WITH RETROGRADE PYELOGRAM/URETERAL STENT PLACEMENT;  Surgeon: Billey Co, MD;  Location: [facility] ORS;  Service: Urology;;   none     PACEMAKER INSERTION       Social History:   reports that she has never smoked. She has never used smokeless tobacco. She reports that she does not drink alcohol and does not use drugs.   Family History:  Her family history includes Cancer in her mother; Heart failure in her father.   Allergies Allergies  Allergen Reactions   Tetanus Toxoid, Adsorbed Swelling   Tetanus Toxoid     Other reaction(s): Unknown   Tetanus Toxoids Swelling   Lisinopril Rash    Other reaction(s): Unknown   Niacin Rash    Other reaction(s): Unknown   Sitagliptin Rash    Other reaction(s): Unknown   Sulfamethoxazole-Trimethoprim Rash    Other reaction(s): Unknown     Home Medications  Prior to Admission medications   Medication Sig Start Date End Date Taking? Authorizing Provider  acetaminophen (TYLENOL) 325 MG tablet Take 2 tablets (650 mg total) by mouth every 6 (six) hours as needed for mild pain (or Fever >/= 101). 11/05/17  Yes Wieting, Richard, MD  amLODipine (NORVASC) 5 MG tablet Take 5 mg by mouth daily. 08/09/21  Yes [provider]  apixaban (ELIQUIS) 5 MG TABS tablet Take 1 tablet by mouth 2 (two) times daily. 08/10/17  Yes [provider]  atorvastatin (LIPITOR) 40 MG tablet Take 1 tablet (40 mg total) by mouth daily at 6 PM. 05/09/16  Yes Epifanio Lesches, MD  cefpodoxime (VANTIN) 100 MG tablet Take 1 tablet (100 mg total) by mouth 2 (two) times daily. 11/29/20  Yes Billey Co, MD  Cholecalciferol 50 MCG (2000 UT) CAPS Take 1 capsule by mouth daily.   Yes [provider]  docusate sodium (COLACE) 100 MG capsule Take 1 capsule (100 mg total) by mouth daily. 11/05/17  Yes Wieting, Richard, MD  fenofibrate (TRICOR) 145 MG tablet Take 1 tablet by mouth daily. 04/17/16  Yes [provider]  furosemide (LASIX) 40 MG tablet  Take 1 tablet (40 mg total) by mouth daily. 07/17/16  Yes Vaughan Basta, MD  insulin aspart (NOVOLOG FLEXPEN) 100 UNIT/ML FlexPen Inject 30 Units into the skin daily. 10/28/19  Yes [provider]  Insulin Glargine (BASAGLAR KWIKPEN) 100 UNIT/ML Inject 70 Units into the skin daily. 04/23/17  Yes [provider]  metFORMIN (GLUCOPHAGE) 1000 MG tablet Take 1 tablet (1,000 mg total) by mouth daily with breakfast. 05/22/17  Yes Max Sane, MD  metoprolol tartrate (LOPRESSOR) 25 MG tablet Take 1 tablet (25 mg total) by mouth 2 (two) times daily. 05/22/17  Yes Max Sane, MD  Potassium Chloride ER 20 MEQ TBCR Take 1 tablet by mouth daily. 02/09/20  Yes [provider]  amiodarone (PACERONE) 200 MG tablet Take 1 tablet (200 mg total) by mouth daily. Patient not taking: Reported on 08/25/2021 11/06/17   Loletha Grayer, MD  BD PEN NEEDLE NANO 2ND GEN 32G X 4 MM MISC 4 (four) times daily. use as directed 02/09/20   [provider]  tamsulosin (FLOMAX) 0.4 MG CAPS capsule Take 1 capsule (0.4 mg total) by mouth daily. Patient not taking: Reported on 08/25/2021 02/05/20   Billey Co, MD     Critical care provider statement:   Total critical care time: 33 minutes   Performed by: Lanney Gins MD   Critical care time was exclusive of separately billable procedures and treating other patients.   Critical care was necessary to treat or prevent imminent or life-threatening deterioration.   Critical care was time spent personally by me on the following activities: development of treatment plan with patient and/or surrogate as well as nursing, discussions with consultants, evaluation of patient's response to treatment, examination of patient, obtaining history from patient or surrogate, ordering and performing treatments and interventions, ordering and review of laboratory studies, ordering and review of radiographic studies,  pulse oximetry and re-evaluation of patient's condition.    Ottie Glazier, M.D.  Pulmonary & Critical Care Medicine

## 2021-09-02 NOTE — Evaluation (Signed)
Physical Therapy Evaluation ?Patient Details ?Name: [name] ?MRN: [id number] ?DOB: 1951/05/05 ?Today's Date: 09/02/2021 ? ?History of Present Illness ? Pt is a 71 y.o. female with medical history significant for severe aortic stenosis s/p TAVR, CAD, MI, hypertension, morbid obesity, obesity hypoventilation syndrome, diabetes mellitus, coronary artery disease, and obstructive sleep apnea who presents to the emergency room via EMS for evaluation of change in mental status. MD assesment includes UTI, septic shock due to E coli pyelonephritis with bacteremia, acute hypoxic respiratory failure, AKI, acute metabolic encephalopathy, and mild thrombocytopenia. ?  ?Clinical Impression ? Pt was lethargic during the session with some difficulty providing details regarding history and PLOF but level of alertness improved somewhat throughout the session. Pt required extensive +2 physical assist with all functional tasks and despite heavy assist was unable to clear the surface of the bed during multiple transfer attempts. Pt found on 2LO2/min with SpO2 100%. Nursing ok with trial on room air with SpO2 mostly in the mid 90s but dropped to a low of 91% when returned to supine at the end of the session, pt returned to 2LO2/min.  Pt will benefit from PT services in a SNF setting upon discharge to safely address deficits listed in patient problem list for decreased caregiver assistance and eventual return to PLOF. ?    ?   ? ?Recommendations for follow up therapy are one component of a multi-disciplinary discharge planning process, led by the attending physician.  Recommendations may be updated based on patient status, additional functional criteria and insurance authorization. ? ?Follow Up Recommendations Skilled nursing-short term rehab (<3 hours/day) ? ?  ?Assistance Recommended at Discharge Frequent or constant Supervision/Assistance  ?Patient can return home with the following ? Two people to help with walking and/or  transfers;Two people to help with bathing/dressing/bathroom;Assistance with cooking/housework;Direct supervision/assist for medications management;Assist for transportation;Help with stairs or ramp for entrance ? ?  ?Equipment Recommendations Other (comment) (TBD at next venue of care)  ?Recommendations for Other Services ?    ?  ?Functional Status Assessment Patient has had a recent decline in their functional status and demonstrates the ability to make significant improvements in function in a reasonable and predictable amount of time.  ? ?  ?Precautions / Restrictions Precautions ?Precautions: Fall ?Restrictions ?Weight Bearing Restrictions: No ?Other Position/Activity Restrictions: HOB > 30 deg  ? ?  ? ?Mobility ? Bed Mobility ?Overal bed mobility: Needs Assistance ?Bed Mobility: Supine to Sit, Sit to Supine, Rolling ?Rolling: Max assist, +2 for physical assistance ?  ?Supine to sit: Max assist, +2 for physical assistance ?Sit to supine: Max assist, +2 for physical assistance ?  ?General bed mobility comments: +2 Max A for BLE and trunk control with heavy cuing for sequencing ?  ? ?Transfers ?  ?  ?  ?  ?  ?  ?  ?  ?  ?General transfer comment: Pt unable to clear buttocks from bed even with +2 Max A and use of BRW ?  ? ?Ambulation/Gait ?  ?  ?  ?  ?  ?  ?  ?  ? ?Stairs ?  ?  ?  ?  ?  ? ?Wheelchair Mobility ?  ? ?Modified Rankin (Stroke Patients Only) ?  ? ?  ? ?Balance Overall balance assessment: Needs assistance ?Sitting-balance support: Feet unsupported, Single extremity supported ?Sitting balance-Leahy Scale: Fair ?  ?  ?  ?  ?  ?  ?  ?  ?  ?  ?  ?  ?  ?  ?  ?  ?   ? ? ? ?  Pertinent Vitals/Pain Pain Assessment ?Pain Assessment: No/denies pain  ? ? ?Home Living Family/patient expects to be discharged to:: Private residence ?Living Arrangements: Alone ?Available Help at Discharge: Personal care attendant;Available PRN/intermittently ?Type of Home: Apartment ?Home Access: Stairs to enter ?Entrance Stairs-Rails:  None ?Entrance Stairs-Number of Steps: 2 ?  ?Home Layout: One level ?Home Equipment: Rollator (4 wheels) ?Additional Comments: Some difficulty providing history secondary to lethargy and general confusion  ?  ?Prior Function Prior Level of Function : Needs assist ?  ?  ?  ?  ?  ?  ?Mobility Comments: Pt reports ambulation with rollator and use of seat to rest as needed. She uses facility transport chair for appointment's requiring more than 100' of ambulation. ?ADLs Comments: Pt reports PCA assists with IADLs and ADLs. Unsure of how often and for how long. ?  ? ? ?Hand Dominance  ? Dominant Hand: Right ? ?  ?Extremity/Trunk Assessment  ? Upper Extremity Assessment ?Upper Extremity Assessment: Generalized weakness ?  ? ?Lower Extremity Assessment ?Lower Extremity Assessment: Generalized weakness ?  ? ?   ?Communication  ? Communication: No difficulties  ?Cognition Arousal/Alertness: Lethargic ?Behavior During Therapy: Flat affect ?Overall Cognitive Status: No family/caregiver present to determine baseline cognitive functioning ?  ?  ?  ?  ?  ?  ?  ?  ?  ?  ?  ?  ?  ?  ?  ?  ?General Comments: Pt is oriented to self, month, and location. Pt needing increased time and cuing for initiation and sequencing. She was more lethargic at beginning of session but became more alert once seated on EOB. Some difficulty providing history and PLOF ?  ?  ? ?  ?General Comments   ? ?  ?Exercises Other Exercises ?Other Exercises: Rolling left/right for core therex and mobility training ?Other Exercises: Static sitting at EOB for improved activity tolerance ?Other Exercises: Multiple sit to/from stand transfer attempts from elevated EOB with max multi-modal cues for proper sequencing  ? ?Assessment/Plan  ?  ?PT Assessment Patient needs continued PT services  ?PT Problem List Decreased strength;Decreased activity tolerance;Decreased balance;Decreased mobility;Decreased knowledge of use of DME ? ?   ?  ?PT Treatment Interventions DME  instruction;Gait training;Stair training;Functional mobility training;Therapeutic activities;Therapeutic exercise;Balance training;Patient/family education   ? ?PT Goals (Current goals can be found in the Care Plan section)  ?Acute Rehab PT Goals ?PT Goal Formulation: Patient unable to participate in goal setting ?Time For Goal Achievement: 09/15/21 ?Potential to Achieve Goals: Good ? ?  ?Frequency Min 2X/week ?  ? ? ?Co-evaluation PT/OT/SLP Co-Evaluation/Treatment: Yes ?Reason for Co-Treatment: Complexity of the patient's impairments (multi-system involvement);For patient/therapist safety;To address functional/ADL transfers ?PT goals addressed during session: Mobility/safety with mobility;Balance ?OT goals addressed during session: ADL's and self-care ?  ? ? ?  ?AM-PAC PT "6 Clicks" Mobility  ?Outcome Measure Help needed turning from your back to your side while in a flat bed without using bedrails?: Total ?Help needed moving from lying on your back to sitting on the side of a flat bed without using bedrails?: Total ?Help needed moving to and from a bed to a chair (including a wheelchair)?: Total ?Help needed standing up from a chair using your arms (e.g., wheelchair or bedside chair)?: Total ?Help needed to walk in hospital room?: Total ?Help needed climbing 3-5 steps with a railing? : Total ?6 Click Score: 6 ? ?  ?End of Session Equipment Utilized During Treatment: Gait belt ?Activity Tolerance: Patient tolerated treatment well ?Patient left:  in bed;with call bell/phone within reach;with bed alarm set ?Nurse Communication: Mobility status ?PT Visit Diagnosis: Difficulty in walking, not elsewhere classified (R26.2);Muscle weakness (generalized) (M62.81) ?  ? ?Time: 7011-0034 ?PT Time Calculation (min) (ACUTE ONLY): 38 min ? ? ?Charges:   PT Evaluation ?$PT Eval Moderate Complexity: 1 Mod ?PT Treatments ?$Therapeutic Activity: 8-22 mins ?  ?   ?D. Royetta Asal PT, DPT ?09/02/21, 1:30 PM ? ? ? ?

## 2021-09-03 DIAGNOSIS — I5022 Chronic systolic (congestive) heart failure: Secondary | ICD-10-CM

## 2021-09-03 DIAGNOSIS — N1832 Chronic kidney disease, stage 3b: Secondary | ICD-10-CM

## 2021-09-03 DIAGNOSIS — E870 Hyperosmolality and hypernatremia: Secondary | ICD-10-CM

## 2021-09-03 LAB — HEPARIN LEVEL (UNFRACTIONATED): Heparin Unfractionated: 0.48 IU/mL (ref 0.30–0.70)

## 2021-09-03 LAB — RENAL FUNCTION PANEL
Albumin: 2.5 g/dL — ABNORMAL LOW (ref 3.5–5.0)
Anion gap: 9 (ref 5–15)
BUN: 61 mg/dL — ABNORMAL HIGH (ref 8–23)
CO2: 28 mmol/L (ref 22–32)
Calcium: 8.1 mg/dL — ABNORMAL LOW (ref 8.9–10.3)
Chloride: 110 mmol/L (ref 98–111)
Creatinine, Ser: 1.9 mg/dL — ABNORMAL HIGH (ref 0.44–1.00)
GFR, Estimated: 28 mL/min — ABNORMAL LOW (ref >60)
Glucose, Bld: 111 mg/dL — ABNORMAL HIGH (ref 70–99)
Phosphorus: 3.9 mg/dL (ref 2.5–4.6)
Potassium: 3.6 mmol/L (ref 3.5–5.1)
Sodium: 147 mmol/L — ABNORMAL HIGH (ref 135–145)

## 2021-09-03 LAB — CBC
HCT: 34.7 % — ABNORMAL LOW (ref 36.0–46.0)
Hemoglobin: 10.5 g/dL — ABNORMAL LOW (ref 12.0–15.0)
MCH: 29.4 pg (ref 26.0–34.0)
MCHC: 30.3 g/dL (ref 30.0–36.0)
MCV: 97.2 fL (ref 80.0–100.0)
Platelets: 117 10*3/uL — ABNORMAL LOW (ref 150–400)
RBC: 3.57 MIL/uL — ABNORMAL LOW (ref 3.87–5.11)
RDW: 15.7 % — ABNORMAL HIGH (ref 11.5–15.5)
WBC: 13.3 10*3/uL — ABNORMAL HIGH (ref 4.0–10.5)
nRBC: 0 % (ref 0.0–0.2)

## 2021-09-03 LAB — MAGNESIUM: Magnesium: 2.1 mg/dL (ref 1.7–2.4)

## 2021-09-03 LAB — GLUCOSE, CAPILLARY
Glucose-Capillary: 96 mg/dL (ref 70–99)
Glucose-Capillary: 124 mg/dL — ABNORMAL HIGH (ref 70–99)
Glucose-Capillary: 179 mg/dL — ABNORMAL HIGH (ref 70–99)
Glucose-Capillary: 181 mg/dL — ABNORMAL HIGH (ref 70–99)
Glucose-Capillary: 195 mg/dL — ABNORMAL HIGH (ref 70–99)

## 2021-09-03 MED ORDER — PANTOPRAZOLE SODIUM 40 MG PO TBEC
40.0000 mg | DELAYED_RELEASE_TABLET | Freq: Every day | ORAL | Status: DC
Start: 2021-09-04 — End: 2021-09-06
  Administered 2021-09-04 – 2021-09-06 (×3): 40 mg via ORAL
  Filled 2021-09-03 (×3): qty 1

## 2021-09-03 MED ORDER — APIXABAN 5 MG PO TABS
5.0000 mg | ORAL_TABLET | Freq: Two times a day (BID) | ORAL | Status: DC
  Administered 2021-09-03 – 2021-09-06 (×6): 5 mg via ORAL
  Filled 2021-09-03 (×6): qty 1

## 2021-09-03 MED ORDER — DOCUSATE SODIUM 100 MG PO CAPS
100.0000 mg | ORAL_CAPSULE | Freq: Two times a day (BID) | ORAL | Status: DC
  Administered 2021-09-03 – 2021-09-05 (×5): 100 mg via ORAL
  Filled 2021-09-03 (×5): qty 1

## 2021-09-03 MED ORDER — POTASSIUM CHLORIDE 20 MEQ PO PACK
20.0000 meq | PACK | Freq: Once | ORAL | Status: AC
  Administered 2021-09-03: 20 meq
  Filled 2021-09-03: qty 1

## 2021-09-03 NOTE — Progress Notes (Signed)
[place], [place] 09/03/21  Subjective:   Hospital day # 9  Renal function appears to be slowly improving.  Creatinine down to 1.9 with good urine output of 1.7 L over the preceding 24 hours. Resting comfortably in bed at the moment.  03/10 0701 - 03/11 0700 In: 661 [P.O.:120; I.V.:441; IV Piggyback:100] Out: 1780 [Urine:1780] Lab Results  Component Value Date   CREATININE 1.90 (H) 09/03/2021   CREATININE 1.98 (H) 09/02/2021   CREATININE 2.16 (H) 09/01/2021    Objective:  Vital signs in last 24 hours:  Temp:  [97.9 F (36.6 C)-98.6 F (37 C)] 98.2 F (36.8 C) (03/11 0747) Pulse Rate:  [26-66] 59 (03/11 1100) Resp:  [16-30] 23 (03/11 1100) BP: (105-145)/(44-92) 124/44 (03/11 1100) SpO2:  [95 %-100 %] 100 % (03/11 1100) FiO2 (%):  [28 %] 28 % (03/10 2344) Weight:  [145.8 kg] 145.8 kg (03/11 0500)  Weight change: -4 kg Filed Weights   09/01/21 1019 09/02/21 0500 09/03/21 0500  Weight: (!) 149.8 kg (!) 144.5 kg (!) 145.8 kg    Intake/Output:    Intake/Output Summary (Last 24 hours) at 09/03/2021 1142 Last data filed at 09/03/2021 1100 Gross per 24 hour  Intake 741.02 ml  Output 1700 ml  Net -958.98 ml      Physical Exam: General No acute distress  HEENT [place] O2  Pulm/lungs Bilateral rales, normal effort  CVS/Heart Irregular, paced rhythm  Abdomen:  Soft  Extremities: 1+ lower extremity edema  Neurologic: Arousable  Access: Left IJ temporary dialysis catheter   Foley in place with small amount of dark urine    Basic Metabolic Panel:  Recent Labs  Lab 08/30/21 0419 08/31/21 0446 09/01/21 0444 09/02/21 0455 09/03/21 0435  NA 138 139 142 145 147*  K 3.9 3.7 3.7 3.8 3.6  CL 103 103 106 109 110  CO2 _0 GLUCOSE 218* 195* 154* 118* 111*  BUN 40* 54* 62* 60* 61*  CREATININE 1.86* 2.06* 2.16* 1.98* 1.90*  CALCIUM 8.3* 8.1* 7.8* 7.9* 8.1*  MG 2.3 2.4 2.3 2.4 2.1  PHOS 1.8* 3.4 3.7 3.7 3.9      CBC: Recent  Labs  Lab 08/30/21 0419 08/31/21 0446 09/01/21 0444 09/02/21 0455 09/03/21 0435  WBC 11.7* 16.0* 14.8* 13.0* 13.3*  HGB 10.8* 11.1* 10.7* 10.4* 10.5*  HCT 34.3* 35.0* 34.6* 33.8* 34.7*  MCV 91.7 91.4 93.3 94.2 97.2  PLT 81* 91* 95* 104* 117*       Lab Results  Component Value Date   HEPBSAG NON REACTIVE 08/31/2021   HEPBSAB NON REACTIVE 08/31/2021      Microbiology:  Recent Results (from the past 240 hour(s))  Blood Culture (routine x 2)     Status: Abnormal   Collection Time: 08/25/21 12:29 PM   Specimen: BLOOD RIGHT HAND  Result Value Ref Range Status   Specimen Description   Final    BLOOD RIGHT HAND Performed at [facility], [address] [place], [place] [address]    Special Requests   Final    BOTTLES DRAWN AEROBIC AND ANAEROBIC Blood Culture results may not be optimal due to an inadequate volume of blood received in culture bottles Performed at [facility], [address] [place], [place] [address]    Culture  Setup Time   Final    GRAM NEGATIVE RODS IN BOTH AEROBIC AND ANAEROBIC BOTTLES CRITICAL RESULT CALLED TO, READ BACK BY AND VERIFIED WITH: MORGAN HICKS AT 2207  ON 08/25/21 BY SS Performed at [facility], [place] [address], [place], [place] [address]    Culture ESCHERICHIA COLI (A)  Final   Report Status 08/28/2021 FINAL  Final   Organism ID, Bacteria ESCHERICHIA COLI  Final      Susceptibility   Escherichia coli - MIC*    AMPICILLIN 8 SENSITIVE Sensitive     CEFAZOLIN <=4 SENSITIVE Sensitive     CEFEPIME <=0.12 SENSITIVE Sensitive     CEFTAZIDIME <=1 SENSITIVE Sensitive     CEFTRIAXONE <=0.25 SENSITIVE Sensitive     CIPROFLOXACIN <=0.25 SENSITIVE Sensitive     GENTAMICIN <=1 SENSITIVE Sensitive     IMIPENEM <=0.25 SENSITIVE Sensitive     TRIMETH/SULFA <=20 SENSITIVE Sensitive     AMPICILLIN/SULBACTAM <=2 SENSITIVE Sensitive     PIP/TAZO <=4 SENSITIVE Sensitive     * ESCHERICHIA COLI  Blood Culture ID Panel  (Reflexed)     Status: Abnormal   Collection Time: 08/25/21 12:29 PM  Result Value Ref Range Status   Enterococcus faecalis NOT DETECTED NOT DETECTED Final   Enterococcus Faecium NOT DETECTED NOT DETECTED Final   Listeria monocytogenes NOT DETECTED NOT DETECTED Final   Staphylococcus species NOT DETECTED NOT DETECTED Final   Staphylococcus aureus (BCID) NOT DETECTED NOT DETECTED Final   Staphylococcus epidermidis NOT DETECTED NOT DETECTED Final   Staphylococcus lugdunensis NOT DETECTED NOT DETECTED Final   Streptococcus species NOT DETECTED NOT DETECTED Final   Streptococcus agalactiae NOT DETECTED NOT DETECTED Final   Streptococcus pneumoniae NOT DETECTED NOT DETECTED Final   Streptococcus pyogenes NOT DETECTED NOT DETECTED Final   A.calcoaceticus-baumannii NOT DETECTED NOT DETECTED Final   Bacteroides fragilis NOT DETECTED NOT DETECTED Final   Enterobacterales DETECTED (A) NOT DETECTED Final    Comment: Enterobacterales represent a large order of gram negative bacteria, not a single organism. CRITICAL RESULT CALLED TO, READ BACK BY AND VERIFIED WITH: MORGAN HICKS AT 2207 ON 08/25/21 BY SS    Enterobacter cloacae complex NOT DETECTED NOT DETECTED Final   Escherichia coli DETECTED (A) NOT DETECTED Final    Comment: CRITICAL RESULT CALLED TO, READ BACK BY AND VERIFIED WITH: MORGAN HICKS AT 2207 ON 08/25/21 BY SS    Klebsiella aerogenes NOT DETECTED NOT DETECTED Final   Klebsiella oxytoca NOT DETECTED NOT DETECTED Final   Klebsiella pneumoniae NOT DETECTED NOT DETECTED Final   Proteus species NOT DETECTED NOT DETECTED Final   Salmonella species NOT DETECTED NOT DETECTED Final   Serratia marcescens NOT DETECTED NOT DETECTED Final   Haemophilus influenzae NOT DETECTED NOT DETECTED Final   Neisseria meningitidis NOT DETECTED NOT DETECTED Final   Pseudomonas aeruginosa NOT DETECTED NOT DETECTED Final   Stenotrophomonas maltophilia NOT DETECTED NOT DETECTED Final   Candida albicans NOT  DETECTED NOT DETECTED Final   Candida auris NOT DETECTED NOT DETECTED Final   Candida glabrata NOT DETECTED NOT DETECTED Final   Candida krusei NOT DETECTED NOT DETECTED Final   Candida parapsilosis NOT DETECTED NOT DETECTED Final   Candida tropicalis NOT DETECTED NOT DETECTED Final   Cryptococcus neoformans/gattii NOT DETECTED NOT DETECTED Final   CTX-M ESBL NOT DETECTED NOT DETECTED Final   Carbapenem resistance IMP NOT DETECTED NOT DETECTED Final   Carbapenem resistance KPC NOT DETECTED NOT DETECTED Final   Carbapenem resistance NDM NOT DETECTED NOT DETECTED Final   Carbapenem resist OXA 48 LIKE NOT DETECTED NOT DETECTED Final   Carbapenem resistance VIM NOT DETECTED NOT DETECTED Final    Comment: Performed at [employer]  [facility], [name] [place], [place] [address]  Urine Culture     Status: Abnormal   Collection Time: 08/25/21  1:47 PM   Specimen: In/Out Cath Urine  Result Value Ref Range Status   Specimen Description   Final    IN/OUT CATH URINE Performed at [facility], [address] [place], [place] [address]    Special Requests   Final    NONE Performed at [facility], [address] [place], [place] [address]    Culture >=100,000 COLONIES/mL ESCHERICHIA COLI (A)  Final   Report Status 08/28/2021 FINAL  Final   Organism ID, Bacteria ESCHERICHIA COLI (A)  Final      Susceptibility   Escherichia coli - MIC*    AMPICILLIN 8 SENSITIVE Sensitive     CEFAZOLIN <=4 SENSITIVE Sensitive     CEFEPIME <=0.12 SENSITIVE Sensitive     CEFTRIAXONE <=0.25 SENSITIVE Sensitive     CIPROFLOXACIN <=0.25 SENSITIVE Sensitive     GENTAMICIN <=1 SENSITIVE Sensitive     IMIPENEM <=0.25 SENSITIVE Sensitive     NITROFURANTOIN <=16 SENSITIVE Sensitive     TRIMETH/SULFA <=20 SENSITIVE Sensitive     AMPICILLIN/SULBACTAM <=2 SENSITIVE Sensitive     PIP/TAZO <=4 SENSITIVE Sensitive     * >=100,000 COLONIES/mL ESCHERICHIA COLI  Resp Panel by RT-PCR (Flu A&B,  Covid) Nasopharyngeal Swab     Status: None   Collection Time: 08/25/21  3:05 PM   Specimen: Nasopharyngeal Swab; Nasopharyngeal(NP) swabs in vial transport medium  Result Value Ref Range Status   SARS Coronavirus 2 by RT PCR NEGATIVE NEGATIVE Final    Comment: (NOTE) SARS-CoV-2 target nucleic acids are NOT DETECTED.  The SARS-CoV-2 RNA is generally detectable in upper respiratory specimens during the acute phase of infection. The lowest concentration of SARS-CoV-2 viral copies this assay can detect is 138 copies/mL. A negative result does not preclude SARS-Cov-2 infection and should not be used as the sole basis for treatment or other patient management decisions. A negative result may occur with  improper specimen collection/handling, submission of specimen other than nasopharyngeal swab, presence of viral mutation(s) within the areas targeted by this assay, and inadequate number of viral copies(<138 copies/mL). A negative result must be combined with clinical observations, patient history, and epidemiological information. The expected result is Negative.  Fact Sheet for Patients:  EntrepreneurPulse.com.au  Fact Sheet for Healthcare Providers:  IncredibleEmployment.be  This test is no t yet approved or cleared by the [place] FDA and  has been authorized for detection and/or diagnosis of SARS-CoV-2 by FDA under an Emergency Use Authorization (EUA). This EUA will remain  in effect (meaning this test can be used) for the duration of the COVID-19 declaration under Section 564(b)(1) of the Act, 21 U.S.C.section 360bbb-3(b)(1), unless the authorization is terminated  or revoked sooner.       Influenza A by PCR NEGATIVE NEGATIVE Final   Influenza B by PCR NEGATIVE NEGATIVE Final    Comment: (NOTE) The Xpert Xpress SARS-CoV-2/FLU/RSV plus assay is intended as an aid in the diagnosis of influenza from Nasopharyngeal swab specimens and should  not be used as a sole basis for treatment. Nasal washings and aspirates are unacceptable for Xpert Xpress SARS-CoV-2/FLU/RSV testing.  Fact Sheet for Patients: EntrepreneurPulse.com.au  Fact Sheet for Healthcare Providers: IncredibleEmployment.be  This test is not yet approved or cleared by the [place] FDA and has been authorized for detection and/or diagnosis of SARS-CoV-2 by FDA under an Emergency Use Authorization (EUA).  This EUA will remain in effect (meaning this test can be used) for the duration of the COVID-19 declaration under Section 564(b)(1) of the Act, 21 U.S.C. section 360bbb-3(b)(1), unless the authorization is terminated or revoked.  Performed at [facility], [address] [place], [place] [address]   MRSA Next Gen by PCR, Nasal     Status: None   Collection Time: 08/25/21  6:44 PM   Specimen: Nasal Mucosa; Nasal Swab  Result Value Ref Range Status   MRSA by PCR Next Gen NOT DETECTED NOT DETECTED Final    Comment: (NOTE) The GeneXpert MRSA Assay (FDA approved for NASAL specimens only), is one component of a comprehensive MRSA colonization surveillance program. It is not intended to diagnose MRSA infection nor to guide or monitor treatment for MRSA infections. Test performance is not FDA approved in patients less than 19 years old. Performed at [facility], [address] [place], [place] [address]   Blood Culture (routine x 2)     Status: None   Collection Time: 08/25/21  7:38 PM   Specimen: BLOOD  Result Value Ref Range Status   Specimen Description BLOOD RIGHT HAND  Final   Special Requests   Final    BOTTLES DRAWN AEROBIC AND ANAEROBIC Blood Culture adequate volume   Culture   Final    NO GROWTH 5 DAYS Performed at [facility], [address] [place], [place] [address]    Report Status 08/30/2021 FINAL  Final  Culture, Respiratory w Gram Stain     Status: None   Collection  Time: 08/26/21  8:21 AM   Specimen: Tracheal Aspirate; Respiratory  Result Value Ref Range Status   Specimen Description   Final    TRACHEAL ASPIRATE Performed at [facility], [address], [place], [place] [address]    Special Requests   Final    NONE Performed at [facility], [address] [place], [place] [address]    Gram Stain   Final    RARE SQUAMOUS EPITHELIAL CELLS PRESENT RARE WBC PRESENT, PREDOMINANTLY MONONUCLEAR NO ORGANISMS SEEN    Culture   Final    RARE Normal respiratory flora-no Staph aureus or Pseudomonas seen Performed at [facility], [address], [place], [place] [address]    Report Status 08/29/2021 FINAL  Final    Coagulation Studies: No results for input(s): LABPROT, INR in the last 72 hours.   Urinalysis: No results for input(s): COLORURINE, LABSPEC, PHURINE, GLUCOSEU, HGBUR, BILIRUBINUR, KETONESUR, PROTEINUR, UROBILINOGEN, NITRITE, LEUKOCYTESUR in the last 72 hours.  Invalid input(s): APPERANCEUR    Imaging: No results found.   Medications:    sodium chloride 10 mL/hr at 09/01/21 1900   cefTRIAXone (ROCEPHIN)  IV Stopped (09/02/21 2234)   heparin 1,800 Units/hr (09/03/21 1100)    chlorhexidine gluconate (MEDLINE KIT)  15 mL Mouth Rinse BID   Chlorhexidine Gluconate Cloth  6 each Topical Q0600   docusate  100 mg Per Tube BID   feeding supplement (NEPRO CARB STEADY)  237 mL Oral TID BM   insulin aspart  0-20 Units Subcutaneous Q4H   insulin glargine-yfgn  20 Units Subcutaneous Daily   mouth rinse  15 mL Mouth Rinse q12n4p   multivitamin with minerals  1 tablet Oral Daily   pantoprazole (PROTONIX) IV  40 mg Intravenous Daily   polyethylene glycol  17 g Per Tube Daily   sodium chloride flush  10-40 mL Intracatheter Q12H   torsemide  40 mg  Oral Daily   sodium chloride, acetaminophen, heparin sodium (porcine), heparin sodium (porcine), ipratropium-albuterol, midazolam, ondansetron **OR** ondansetron  (ZOFRAN) IV, sodium chloride flush  Assessment/ Plan:  71 y.o. 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  Admit creatinine of 2.31 that peaked at 4.47.  CRRT discontinued 08/29/2021.  UF only treatment on 09/01/2021 net UF 1.5 L.  03/10 0701 - 03/11 0700 In: 557 [P.O.:120; I.V.:441; IV Piggyback:100] Out: 3220 [Urine:1780]    -Renal function appears to be slowly improving.  Creatinine down to 1.9.  Urine output 1.7 L.  No immediate need for additional dialysis at the moment.    #Acute respiratory failure Extubated on March 6 Continue nasal cannula at this time.  # Sepsis with UTI, hypotension, shock E Coli Pyelonephritis and bacteremia  # Nephrolithiasis with rt proximal stone with hydronephrosis and left UPJ stone without hydronephrosis. On 3/2, patient underwent cystoscopy with bilateral ureteral stents placed by Dr. Diamantina Providence. Patient was found to have purulent urine in bladder. Plan for outpatient b/l uretereroscopy/laser lithotripsy in 1-2 months Small stone retrieved by nursing.  Sent for analysis.      LOS: 9 [name] 3/11/202311:42 AM  [place], [place]  Note: This note was prepared with Dragon dictation. Any transcription errors are unintentional

## 2021-09-03 NOTE — Consult Note (Signed)
ANTICOAGULATION CONSULT NOTE ? ?Pharmacy Consult for Apixaban ?Indication: atrial fibrillation ? ?Patient Measurements: ?Height: 5\' 5"  (165.1 cm) ?Weight: (!) 145.8 kg (321 lb 6.9 oz) ?IBW/kg (Calculated) : 57 ? ?Labs: ?Recent Labs  ?  09/01/21 ?0444 09/02/21 ?0455 09/03/21 ?7340  ?HGB 10.7* 10.4* 10.5*  ?HCT 34.6* 33.8* 34.7*  ?PLT 95* 104* 117*  ?HEPARINUNFRC 0.38 0.45 0.48  ?CREATININE 2.16* 1.98* 1.90*  ? ? ? ?Estimated Creatinine Clearance: 40.2 mL/min (A) (by C-G formula based on SCr of 1.9 mg/dL (H)). ? ? ?Medical History: ?Past Medical History:  ?Diagnosis Date  ? Bacteremia   ? CAD (coronary artery disease)   ? Diabetes mellitus without complication ([place])   ? Diastolic CHF ([place])   ? Hyperlipemia   ? Hypertension   ? Lymphedema   ? MI (myocardial infarction) ([place])   ? OSA (obstructive sleep apnea)   ? Severe aortic stenosis   ? ? ?Medications: No AC/APT pertinent drug allergies ?PTA: Apixaban 5mg  BID (last dose on 3/02 AM PTA >> admitted on 3/02 - 1344) ?Inpatient: Apixaban (stopped w/o receiving any doses) >> Heparin gtt ? ?Assessment: ?71yo F w/ h/o  HFpEF, severe AS (s/p TAVR), morbid obesity (BMI 39), obesity hypoventilation syndrome, OSA, DM, CAD, & HTN who presented to [facility] ED on 08/25/2021 due to complaints of altered mental 2/2 severe urosepsis and septic shock ISO Rt ureteral stone, AKI, ac metabolic enceph. Pharmacy consulted for conversion of heparin gtt back to home apixaban.  ?   ?Plan:  ?--Continue IV heparin until 1959 tonight ?--Re-start home apixaban 5 mg BID tonight at 2000 ?--CBC at least every 3 days per protocol ? ?Benita Gutter  ?09/03/2021 ?1:51 PM ? ? ? ? ? ? ?

## 2021-09-03 NOTE — Assessment & Plan Note (Signed)
Status post TAVR 

## 2021-09-03 NOTE — Assessment & Plan Note (Signed)
Secondary to septic shock.  Initially requiring ventilator support, now resolved and off of vent.  Currently on 2L nasal cannula. ?

## 2021-09-03 NOTE — Hospital Course (Addendum)
Patient is a 71 year old female with past medical history of morbid obesity, obstructive sleep apnea, diabetes mellitus, severe aortic stenosis status post TAVR and systolic heart failure with an ejection fraction of 45-50% admitted on 3/2 for UTI with septic shock in the setting of bilateral ureteral stones causing obstruction.  Patient was taken for urgent cystoscopy with bilateral stent placement and came back to the ICU postprocedure intubated.  Urology plans to do outpatient bilateral ureteroscopy with laser lithotripsy in 1 to 2 months.  Septic shock and urinary obstruction caused the patient's stage IIIb chronic kidney disease to worsen.  Nephrology consulted and patient started on CRRT which was discontinued on 3/6.  Ultrafiltration only treatment on 3/9 with IV albumin given.  Patient able to be weaned off of ventilator and pressor support.  Blood and urine cultures positive for pansensitive E. coli.  By 3/10, patient's mentation slightly improving and she was transferred to the hospitalist service. Patient's renal function continues to improve and nephrology able to stop hemodialysis.

## 2021-09-03 NOTE — Progress Notes (Signed)
Report called to [facility], RN on 1C. Patient will be moved to room 130.  ?

## 2021-09-03 NOTE — Consult Note (Signed)
ANTICOAGULATION CONSULT NOTE ? ?Pharmacy Consult for Heparin gtt (PTA Eliquis) ?Indication: atrial fibrillation ? ?Patient Measurements: ?Height: 5\' 5"  (366.2 cm) ?Weight: (!) 144.5 kg (318 lb 9 oz) ?IBW/kg (Calculated) : 57 ?Heparin Dosing Weight: 96.5kg ? ?Vital Signs: ?Temp: 98.6 ?F (37 ?C) (03/11 0400) ?Temp Source: Axillary (03/11 0400) ?BP: 131/60 (03/11 0400) ?Pulse Rate: 60 (03/11 0400) ? ?Labs: ?Recent Labs  ?  09/01/21 ?0444 09/02/21 ?0455 09/03/21 ?9476  ?HGB 10.7* 10.4* 10.5*  ?HCT 34.6* 33.8* 34.7*  ?PLT 95* 104* 117*  ?HEPARINUNFRC 0.38 0.45 0.48  ?CREATININE 2.16* 1.98* 1.90*  ? ? ? ?Estimated Creatinine Clearance: 40 mL/min (A) (by C-G formula based on SCr of 1.9 mg/dL (H)). ? ? ?Medical History: ?Past Medical History:  ?Diagnosis Date  ? Bacteremia   ? CAD (coronary artery disease)   ? Diabetes mellitus without complication ([place])   ? Diastolic CHF ([place])   ? Hyperlipemia   ? Hypertension   ? Lymphedema   ? MI (myocardial infarction) ([place])   ? OSA (obstructive sleep apnea)   ? Severe aortic stenosis   ? ? ?Medications: No AC/APT pertinent drug allergies ?Heparin Dosing Weight: 96.5kg ?PTA: Apixaban 5mg  BID (last dose on 3/02 AM PTA >> admitted on 3/02 - 1344) ?Inpatient: Apixaban (stopped w/o receiving any doses) >> Heparin gtt. ? ?Assessment: ?71yo F w/ h/o  HFpEF, severe AS (s/p TAVR), morbid obesity (BMI 57), obesity hypoventilation syndrome, OSA, DM, CAD, & HTN who presented to [facility] ED on 08/25/2021 due to complaints of altered mental 2/2 severe urosepsis and septic shock ISO Rt ureteral stone, AKI, ac metabolic enceph. Pharmacy consulted for conversion of DOAC to Heparin gtt. Since admission her platelets have been trending down from a low baseline. A decision was made to continue heparin but lower the therapeutic goal range ?   ? ?Goal of Therapy:  ?Heparin level 0.3-0.5 units/ml ?aPTT 66-85 seconds ?Monitor platelets by anticoagulation protocol: Yes ? ?3/7 0015 aPTT 59, subtherapeutic, HL 0.43  therapeutic ?3/7 0419 HL 0.44 therapeutic, aPTT 61 ?3/8 0446 HL 0.41, therapeutic x 3  ?3/9 0444 HL 0.38, therapeutic x 4 ?3/10 0455 HL 0.45, therapeutic x 5 ?3/11 0435 HL 0.48, therapeutic x 6 ?  ?Plan:  ?Continue heparin infusion rate at 1800 units/hr. ?Recheck HL daily w/ AM labs while therapeutic  ?Daily CBC while on IV heparin ? ?[name], PharmD, MBA ?09/03/2021 ?5:16 AM ? ? ? ? ? ? ?

## 2021-09-03 NOTE — Progress Notes (Signed)
NAME:  [name], MRN:  [id number], DOB:  [birth year], LOS: 9 ADMISSION DATE:  08/25/2021, CONSULTATION DATE:  08/25/2021 REFERRING MD:  Dr. Francine Graven, CHIEF COMPLAINT:  Altered Mental Status   Brief Pt Description / Synopsis:  71 y.o. Female admitted with Severe Urosepsis with Septic Shock in the setting of Right proximal ureteral stone with Hydronephrosis & large left renal stone, Acute Kidney Injury, and Acute Metabolic Encephalopathy.  She underwent urgent cystoscopy with bilateral ureteral stent placement.  She returns to ICU post procedure and remains intubated.  History of Present Illness:  [name] is a 71 y.o. female with a past medical history significant for HFpEF, severe aortic stenosis status post TAVR, morbid obesity, obesity hypoventilation syndrome, OSA, diabetes mellitus, coronary artery disease, hypertension who presented to [facility] ED on 08/25/2021 due to complaints of altered mental status.  Patient is currently intubated and sedated following return from the OR and no family is available, therefore history is obtained from chart review. 08/29/21- patient with improved renal failure on ventilator.  She passed SBT and was liberated from MV.  I called and updated family today  08/30/21- patient improved s/p treatment of E coli bacteremia and AKI.  Plan for HD tommorow.   08/31/21- patient with sepsis from UTI and bacteremia Ecoli . Nephrology for possibe HD  .  Encephalopathy from septic and metabolic encephalopathy. CT head is done due to poor mentation but is normal and ABG istable.    Newer results are available. Click to view them now.      Component 6 d ago  Specimen Description IN/OUT CATH URINE  Performed at [facility], [address] [place], [place] [address]   Special Requests NONE  Performed at [facility], [address] [place], [place] [address]   Culture >=100,000 COLONIES/mL ESCHERICHIA COLI Abnormal    Report Status 08/28/2021 FINAL    Organism ID, Bacteria ESCHERICHIA COLI Abnormal    Resulting Agency [place] [facility]      09/01/21- patient seems to have improved sensorium today able to wiggle toes.  She is making urine and its less purulent more amber. Overall improved clinically.  No vasopressor support.     09/02/21- patient clinically improved may optimize for transfer from ICU to step down unit.  Off vasopressors.   09/03/21-Patient is improved , she is transitioned to [facility] and PCCM will sign off. Changed to eliquis from heparin gtt.    Pertinent  Medical History  HFpEF Severe aortic stenosis status post TAVR Coronary artery disease Hypertension Hyperlipidemia Obesity hypoventilation syndrome OSA Morbid obesity Diabetes mellitus  Micro Data:  08/25/2021: SARS-CoV-2 and influenza PCR>> negative 08/25/2021: Blood culture>>+E Coli 08/25/2021: Urine>>+E coli 08/25/2021: Tracheal aspirate>> 08/25/2021: Strep pneumo urinary antigen>>negative 08/25/2021: Legionella urine antigen>>  Antimicrobials:  Cefepime x1 dose 3/2 Flagyl x1 dose 3/2 Vancomycin x1 dose 3/2 Ceftriaxone 3/2>>  Significant Hospital Events: Including procedures, antibiotic start and stop dates in addition to other pertinent events   3/2: Presented to ED, admitted by hospitalist.  Urology consulted, taken emergently to the OR for cystoscopy and bilateral ureteral stent placement.  Returns to ICU postop and remains intubated, PCCM consulted 3/3: worsening shock, added vasopressin; started CRRT 3/4: shock improving; on CRRT; minimal vent settings  Interim History / Subjective:  Shock improving, now off Levophed. Remains on CRRT. On minimal vent settings. Not following commands yet but still working down on sedation. E coli in blood and urine is pan-sensitive.  Objective   Blood  pressure (!) 135/59, pulse (!) 59, temperature 98.2 F (36.8 C), temperature source Oral, resp. rate (!) 21, height 5' 5" (1.651 m), weight (!) 145.8 kg, SpO2 98 %.    FiO2 (%):   [28 %] 28 %   Intake/Output Summary (Last 24 hours) at 09/03/2021 0906 Last data filed at 09/03/2021 0800 Gross per 24 hour  Intake 632.97 ml  Output 1700 ml  Net -1067.03 ml    Filed Weights   09/01/21 1019 09/02/21 0500 09/03/21 0500  Weight: (!) 149.8 kg (!) 144.5 kg (!) 145.8 kg      Resolved Hospital Problem list     Assessment & Plan:   Septic Shock 2/2 E coli pyelonephritis with bacteremia Bilateral ureteral stones with severe left hydronephrosis s/p b/l ureterals stent placement (08/25/21) -Wean pressors as able; off vasopressin, cont Levo -MAP goal >65 -Stress dose steroids until off pressors -Continue ceftriaxone 2 g daily for planned 7 day course  Chronic HFpEF Demand ischemia Paroxsymal Atrial Fibrillation PMHx: Severe aortic stenosis s/p TAVR, CAD, MI, Hypertension, Hyperlipidemia -Continuous cardiac monitoring -Maintain MAP >65 as above -Vasopressors as needed to maintain MAP goal -EF down to 45-50% from 50-55% prior in setting of acute infection -Optimize sepsis and ensure GDMT with Cardiology follow up -Heparin gtt for Afib  Acute Hypoxic Respiratory Failure in the setting of septic shock, multiple metabolic derangements PMHx: OSA -Full vent support, implement lung protective strategies -Plateau pressures less than 30 cm H20 -Wean FiO2 & PEEP as tolerated to maintain O2 sats >92% -Follow intermittent Chest X-ray & ABG as needed -Spontaneous Breathing Trials when respiratory parameters met and mental status permits -Implement VAP Bundle -Prn Bronchodilators  Acute Kidney Injury Anion Gap Metabolic Acidosis in the setting of Lactic Acidosis and AKI -Nephrology consulted -Continue CRRT; goal UF 0 -Avoid nephrotoxins -Renally dose medications -Strict I/O; monitor UOP  Mild Thrombocytopenia, likely in setting of septic shock -Monitor for S/Sx of bleeding -Trend CBC -Heparin as above -Transfuse for Hgb <7  Acute Metabolic Encephalopathy Sedation  needs in the setting of mechanical ventilation -Maintain a RASS goal of 0 to -1 -Daily wake up assessment, SBT today -Treat sepsis and metabolic derangements as above  Diabetes Mellitus -CBG's q4h; Target range of 140 to 180 -SSI -Follow ICU Hypo/Hyperglycemia protocol   Best Practice (right click and "Reselect all SmartList Selections" daily)   Diet/type: NPO; tube feeds DVT prophylaxis: systemic heparin GI prophylaxis: PPI Lines: Central line, Dialysis Catheter, and yes and it is still needed Foley:  Yes, and it is still needed Code Status:  full code Last date of multidisciplinary goals of care discussion [08/26/21]  Labs   CBC: Recent Labs  Lab 08/30/21 0419 08/31/21 0446 09/01/21 0444 09/02/21 0455 09/03/21 0435  WBC 11.7* 16.0* 14.8* 13.0* 13.3*  HGB 10.8* 11.1* 10.7* 10.4* 10.5*  HCT 34.3* 35.0* 34.6* 33.8* 34.7*  MCV 91.7 91.4 93.3 94.2 97.2  PLT 81* 91* 95* 104* 117*     Basic Metabolic Panel: Recent Labs  Lab 08/30/21 0419 08/31/21 0446 09/01/21 0444 09/02/21 0455 09/03/21 0435  NA 138 139 142 145 147*  K 3.9 3.7 3.7 3.8 3.6  CL 103 103 106 109 110  CO2 _0 GLUCOSE 218* 195* 154* 118* 111*  BUN 40* 54* 62* 60* 61*  CREATININE 1.86* 2.06* 2.16* 1.98* 1.90*  CALCIUM 8.3* 8.1* 7.8* 7.9* 8.1*  MG 2.3 2.4 2.3 2.4 2.1  PHOS 1.8* 3.4 3.7 3.7 3.9    GFR: Estimated  Creatinine Clearance: 40.2 mL/min (A) (by C-G formula based on SCr of 1.9 mg/dL (H)). Recent Labs  Lab 08/27/21 1456 08/28/21 0453 08/31/21 0446 09/01/21 0444 09/02/21 0455 09/03/21 0435  WBC  --    < > 16.0* 14.8* 13.0* 13.3*  LATICACIDVEN 2.2*  --   --   --   --   --    < > = values in this interval not displayed.     Liver Function Tests: Recent Labs  Lab 08/30/21 0419 08/31/21 0446 09/01/21 0444 09/02/21 0455 09/03/21 0435  ALBUMIN 2.4* 2.2* 2.2* 2.4* 2.5*    No results for input(s): LIPASE, AMYLASE in the last 168 hours. No results for input(s): AMMONIA  in the last 168 hours.  ABG    Component Value Date/Time   PHART 7.47 (H) 08/31/2021 0839   PCO2ART 33 08/31/2021 0839   PO2ART 116 (H) 08/31/2021 0839   HCO3 24.0 08/31/2021 0839   ACIDBASEDEF 0.2 08/29/2021 1627   O2SAT 99.1 08/31/2021 0839      Coagulation Profile: No results for input(s): INR, PROTIME in the last 168 hours.   Cardiac Enzymes: No results for input(s): CKTOTAL, CKMB, CKMBINDEX, TROPONINI in the last 168 hours.  HbA1C: Hgb A1c MFr Bld  Date/Time Value Ref Range Status  08/25/2021 09:11 PM 7.6 (H) 4.8 - 5.6 % Final    Comment:    (NOTE)         Prediabetes: 5.7 - 6.4         Diabetes: >6.4         Glycemic control for adults with diabetes: <7.0   07/12/2017 01:09 AM 6.4 (H) 4.8 - 5.6 % Final    Comment:    (NOTE) Pre diabetes:          5.7%-6.4% Diabetes:              >6.4% Glycemic control for   <7.0% adults with diabetes     CBG: Recent Labs  Lab 09/02/21 1504 09/02/21 1959 09/02/21 2336 09/03/21 0328 09/03/21 0738  GLUCAP 118* 373* 91 96 124*     Review of Systems:   Unable to assess due to intubation and sedation   Past Medical History:  She,  has a past medical history of Bacteremia, CAD (coronary artery disease), Diabetes mellitus without complication ([place]), Diastolic CHF ([place]), Hyperlipemia, Hypertension, Lymphedema, MI (myocardial infarction) ([place]), OSA (obstructive sleep apnea), and Severe aortic stenosis.   Surgical History:   Past Surgical History:  Procedure Laterality Date   CARDIAC SURGERY     CYSTOSCOPY W/ URETERAL STENT PLACEMENT  08/25/2021   Procedure: CYSTOSCOPY WITH RETROGRADE PYELOGRAM/URETERAL STENT PLACEMENT;  Surgeon: Billey Co, MD;  Location: [facility] ORS;  Service: Urology;;   none     PACEMAKER INSERTION       Social History:   reports that she has never smoked. She has never used smokeless tobacco. She reports that she does not drink alcohol and does not use drugs.   Family History:  Her family  history includes Cancer in her mother; Heart failure in her father.   Allergies Allergies  Allergen Reactions   Tetanus Toxoid, Adsorbed Swelling   Tetanus Toxoid     Other reaction(s): Unknown   Tetanus Toxoids Swelling   Lisinopril Rash    Other reaction(s): Unknown   Niacin Rash    Other reaction(s): Unknown   Sitagliptin Rash    Other reaction(s): Unknown   Sulfamethoxazole-Trimethoprim Rash    Other reaction(s): Unknown  Home Medications  Prior to Admission medications   Medication Sig Start Date End Date Taking? Authorizing Provider  acetaminophen (TYLENOL) 325 MG tablet Take 2 tablets (650 mg total) by mouth every 6 (six) hours as needed for mild pain (or Fever >/= 101). 11/05/17  Yes Wieting, Richard, MD  amLODipine (NORVASC) 5 MG tablet Take 5 mg by mouth daily. 08/09/21  Yes [provider]  apixaban (ELIQUIS) 5 MG TABS tablet Take 1 tablet by mouth 2 (two) times daily. 08/10/17  Yes [provider]  atorvastatin (LIPITOR) 40 MG tablet Take 1 tablet (40 mg total) by mouth daily at 6 PM. 05/09/16  Yes Epifanio Lesches, MD  cefpodoxime (VANTIN) 100 MG tablet Take 1 tablet (100 mg total) by mouth 2 (two) times daily. 11/29/20  Yes Billey Co, MD  Cholecalciferol 50 MCG (2000 UT) CAPS Take 1 capsule by mouth daily.   Yes [provider]  docusate sodium (COLACE) 100 MG capsule Take 1 capsule (100 mg total) by mouth daily. 11/05/17  Yes Wieting, Richard, MD  fenofibrate (TRICOR) 145 MG tablet Take 1 tablet by mouth daily. 04/17/16  Yes [provider]  furosemide (LASIX) 40 MG tablet Take 1 tablet (40 mg total) by mouth daily. 07/17/16  Yes Vaughan Basta, MD  insulin aspart (NOVOLOG FLEXPEN) 100 UNIT/ML FlexPen Inject 30 Units into the skin daily. 10/28/19  Yes [provider]  Insulin Glargine (BASAGLAR KWIKPEN) 100 UNIT/ML Inject 70 Units into the skin daily. 04/23/17  Yes [provider]  metFORMIN  (GLUCOPHAGE) 1000 MG tablet Take 1 tablet (1,000 mg total) by mouth daily with breakfast. 05/22/17  Yes Max Sane, MD  metoprolol tartrate (LOPRESSOR) 25 MG tablet Take 1 tablet (25 mg total) by mouth 2 (two) times daily. 05/22/17  Yes Max Sane, MD  Potassium Chloride ER 20 MEQ TBCR Take 1 tablet by mouth daily. 02/09/20  Yes [provider]  amiodarone (PACERONE) 200 MG tablet Take 1 tablet (200 mg total) by mouth daily. Patient not taking: Reported on 08/25/2021 11/06/17   Loletha Grayer, MD  BD PEN NEEDLE NANO 2ND GEN 32G X 4 MM MISC 4 (four) times daily. use as directed 02/09/20   [provider]  tamsulosin (FLOMAX) 0.4 MG CAPS capsule Take 1 capsule (0.4 mg total) by mouth daily. Patient not taking: Reported on 08/25/2021 02/05/20   Billey Co, MD     Critical care provider statement:   Total critical care time: 33 minutes   Performed by: Lanney Gins MD   Critical care time was exclusive of separately billable procedures and treating other patients.   Critical care was necessary to treat or prevent imminent or life-threatening deterioration.   Critical care was time spent personally by me on the following activities: development of treatment plan with patient and/or surrogate as well as nursing, discussions with consultants, evaluation of patient's response to treatment, examination of patient, obtaining history from patient or surrogate, ordering and performing treatments and interventions, ordering and review of laboratory studies, ordering and review of radiographic studies, pulse oximetry and re-evaluation of patient's condition.    Ottie Glazier, M.D.  Pulmonary & Critical Care Medicine

## 2021-09-03 NOTE — Consult Note (Signed)
PHARMACY CONSULT NOTE - FOLLOW UP ? ?Pharmacy Consult for Electrolyte Monitoring and Replacement  ? ?Recent Labs: ?Potassium (mmol/L)  ?Date Value  ?09/03/2021 3.6  ? ?Magnesium (mg/dL)  ?Date Value  ?09/03/2021 2.1  ? ?Calcium (mg/dL)  ?Date Value  ?09/03/2021 8.1 (L)  ? ?Albumin (g/dL)  ?Date Value  ?09/03/2021 2.5 (L)  ? ?Phosphorus (mg/dL)  ?Date Value  ?09/03/2021 3.9  ? ?Sodium (mmol/L)  ?Date Value  ?09/03/2021 147 (H)  ? ? ? ?Assessment: ?71 yo F w/ h/o HFpEF, Severe AS, CAD (AC-paced), HTN, BMI 57, DM, severe urosepsis with septic shock ISO Rt prox ureteral stone w/ hydronephrosis & large Lt renal stone c/b AKI, acute metabolic encephalopathy. S/p cytoscopy B/L ureteral stents on 3/02. Pharmacy consulted for the mgmt of electrolytes. On torsemide 40 mg daily.  ? ?Goal of Therapy:  ?Lytes WNL ? ?Plan:  ?Kcl 20 mEq x 1  ?F/u with AM labs.  ? ?Eleonore Chiquito, PharmD, BCPS ?Clinical Pharmacist ?09/03/2021 9:42 AM ? ? ?

## 2021-09-03 NOTE — Assessment & Plan Note (Addendum)
Likely from poor p.o. intake from restricted diet and patient's poor mentation.  Should see some improvement now that she is much more alert and hopefully her diet can be upgraded.  Sodium at 145 today after receiving low-dose of half-normal saline.

## 2021-09-03 NOTE — Progress Notes (Addendum)
[facility] Progress Note ? ?Patient: [name]    IRW:[id number]  DOA: 08/25/2021    ?Date of Service: the patient was seen and examined on 09/03/2021 ? ?Brief hospital course: ?Patient is a 71 year old female with past medical history of morbid obesity, obstructive sleep apnea, diabetes mellitus, severe aortic stenosis status post TAVR and systolic heart failure with an ejection fraction of 45-50% admitted on 3/2 for UTI with septic shock in the setting of bilateral ureteral stones causing obstruction.  Patient was taken for urgent cystoscopy with bilateral stent placement and came back to the ICU postprocedure intubated. ? ?Septic shock and urinary obstruction caused the patient's stage IIIb chronic kidney disease to worsen.  Nephrology consulted and patient started on CRRT which was discontinued on 3/6.  Ultrafiltration only treatment on 3/9 with IV albumin given.  Patient able to be weaned off of ventilator and pressor support.  Blood and urine cultures positive for pansensitive E. coli.  By 3/10, patient's mentation slightly improving and she was transferred to the hospitalist service. ? ? ?Assessment and Plan: ?Assessment and Plan: ?* Septic shock due to Escherichia coli [facility]) ?Patient met criteria for septic shock on admission given lactic acid level of 4.3, urinary tract infection source, cultures that grew out E. coli as well as on refractory hypotension requiring pressor support, tachycardia and tachypnea.  Sepsis now stabilized.  Patient weaned off of pressor support.  In review of her antibiotics, after today, she will have received 10 days of treatment and can stop. ? ?Obstructive uropathy ?Patient noted to have obstructive uropathy with 2 6 mm stones in the proximal right ureter resulting in right-sided hydronephrosis as well as left-sided hydronephrosis.  Appreciate urology help with status post bilateral stent placement ? ?Acute renal failure superimposed on stage 3b chronic kidney disease  ([place]) ?Most likely secondary to ATN from hypotension.  Initially on CRRT and then ultrafiltration.  Nephrology following.  Urine output increasing.  Creatinine today at 1.9 with a GFR of 28 ? ?Hypernatremia ?Sodium today at 147.  Likely due to decreased mentation and poor p.o. intake.  We will also have to limit IV fluids given obstructive uropathy and poor urine output.  Continue to follow. ? ?Acute metabolic encephalopathy ?Secondary to septic shock and uremia.  Baseline is awake and alert and oriented.  Patient is starting to slowly regain mentation.  Currently on dysphagia diet. ? ?Chronic systolic CHF (congestive heart failure) ([place]) ?Ejection fraction 45 to 50%.  Check BNP in the morning. ? ?Paroxysmal atrial fibrillation ([place]) ?History of paroxysmal atrial fibrillation.  Currently in atrial fibrillation.  Heart rate currently well controlled.  On heparin infusion and when she is more awake, will restart Eliquis if renal function improves.  Her home amiodarone is also on hold although although reportedly, she has not been taking this medication. ? ?Diabetes mellitus without complication ([place]) ?A1c of 7.6.  Currently on Lantus and sliding scale.  CBGs in the 120s to 170s. ? ?Obstructive sleep apnea ?Patient most likely has underlying obstructive sleep apnea related to morbid obesity as well as possible obesity hypoventilation syndrome. ?Her pulse oximetry drops in the low 80s while asleep.  We will see about starting nightly CPAP. ? ?Hypertension ?Now that she is weaned off of pressor support, blood pressure starting to trend back upwards.  We will slowly restart her home medications.  Currently just on torsemide.  ? ?Acute respiratory failure with hypoxia and hypercapnia (HCC)-resolved as of 09/03/2021 ?Secondary to septic shock.  Initially requiring  ventilator support, now resolved and off of vent.  Currently on 2L nasal cannula. ? ?Morbid obesity ([place]) ?Meets criteria with BMI greater than 50 ?Complicates  overall prognosis and care ? ?Severe aortic stenosis ?Status post TAVR ? ?CAD (coronary artery disease) ?Stable ?Continue statins ?Hold metoprolol due to hypotension ? ? ? ? ? ? ?Body mass index is 53.49 kg/m?Marland Kitchen  ?Nutrition Problem: Inadequate oral intake ?Etiology: inability to eat (pt sedated and ventilated) ?Pressure Injury 05/02/16 Stage I -  Intact skin with non-blanchable redness of a localized area usually over a bony prominence. (Active)  ?05/02/16 1400  ?Location: Buttocks  ?Location Orientation: Medial  ?Staging: Stage I -  Intact skin with non-blanchable redness of a localized area usually over a bony prominence.  ?Wound Description (Comments):   ?Present on Admission: Yes  ?  ? ?Consultants: ?Critical care ?Nephrology ?Urology ? ?Procedures: ?Ventilator from 3/2-3/9 ? ?Antimicrobials: ?IV cefepime 3/2 x 1 dose ?IV Rocephin 3/2 - 3/11 ? ?Code Status: Full code ? ? ?Subjective: Patient still somewhat sleepy although she has been awake earlier and following commands ? ?Objective: ?Noted borderline bradycardia ?Vitals:  ? 09/03/21 1100 09/03/21 1200  ?BP: (!) 124/44 (!) 130/53  ?Pulse: (!) 59 60  ?Resp: (!) 23 (!) 21  ?Temp:  98 ?F (36.7 ?C)  ?SpO2: 100% 100%  ? ? ?Intake/Output Summary (Last 24 hours) at 09/03/2021 1401 ?Last data filed at 09/03/2021 1100 ?Gross per 24 hour  ?Intake 687 ml  ?Output 1350 ml  ?Net -663 ml  ? ?Filed Weights  ? 09/01/21 1019 09/02/21 0500 09/03/21 0500  ?Weight: (!) 149.8 kg (!) 144.5 kg (!) 145.8 kg  ? ?Body mass index is 53.49 kg/m?. ? ?Exam: ? ?General: Somnolent, no acute distress ?HEENT: Normocephalic, atraumatic, narrow airway ?Cardiovascular: irregular rhythm, borderline bradycardia ?Respiratory: Decreased breath sounds throughout secondary to body habitus ?Abdomen: Soft, obese, nontender, hypoactive bowel sounds ?Musculoskeletal: No clubbing or cyanosis, 1+ pitting edema ?Skin: No skin breaks, tears or lesions ?Psychiatry: Some delirium although slowly  improving ?Neurology: No overt focal deficits although currently limited due to patient's delirium ? ?Data Reviewed: ?Today's labs reviewed noting sodium of 147, creatinine of 1.9 with GFR of 28 (improved from previous day)  and white count of 13.3.  CBG of 179 ? ?Disposition:  ?Status is: Inpatient ?Remains inpatient appropriate because: Need to wake up more ?  ? ?Family Communication: Left message for family ?DVT Prophylaxis: ?apixaban (ELIQUIS) tablet 5 mg Start: 09/03/21 2000 ?apixaban (ELIQUIS) tablet 5 mg  ? ? ?Author: ?Annita Brod ,MD ?09/03/2021 2:01 PM ? ?To reach On-call, see care teams to locate the attending and reach out via [email or web address]. ?Between 7PM-7AM, please contact night-coverage ?If you still have difficulty reaching the attending provider, please page the [facility] (Director on Call) for [facility] on amion for assistance. ? ?

## 2021-09-03 NOTE — [place] FL2 (Signed)
?[place] MEDICAID FL2 LEVEL OF CARE SCREENING TOOL  ?  ? ?IDENTIFICATION  ?Patient Name: ?[name] Birthdate: 27-Feb-1951 Sex: female Admission Date (Current Location): ?08/25/2021  ?[place] and [place] Number: ?   ?  Facility and Address:  ?  ?     Provider Number: ?   ?Attending Physician Name and Address:  ?Annita Brod, MD ? Relative Name and Phone Number:  ?  ?   ?Current Level of Care: ?  Recommended Level of Care: ?  Prior Approval Number: ?  ? ?Date Approved/Denied: ?  PASRR Number: ?  ? ?Discharge Plan: ?  ?  ? ?Current Diagnoses: ?Patient Active Problem List  ? Diagnosis Date Noted  ? Obstructive uropathy 08/25/2021  ? CAD (coronary artery disease)   ? Diabetes mellitus without complication ([place])   ? Acute metabolic encephalopathy   ? AKI (acute kidney injury) ([place])   ? Paroxysmal atrial fibrillation (HCC)   ? Right ureteral stone   ? Sepsis ([place]) 11/02/2017  ? Wide-complex tachycardia 07/11/2017  ? Aortic stenosis 05/21/2017  ? Syncope, near 05/19/2017  ? Chronic diastolic heart failure ([place]) 07/27/2016  ? Hypertension 07/27/2016  ? Obstructive sleep apnea 07/27/2016  ? Cellulitis 07/27/2016  ? Pressure injury of skin 05/02/2016  ? Lactic acidosis   ? Severe aortic stenosis   ? OSA on CPAP   ? Morbid obesity ([place])   ? Community acquired pneumonia   ? ? ?Orientation RESPIRATION BLADDER Height & Weight   ?  ?  ?     Weight: (!) 321 lb 6.9 oz (145.8 kg) ?Height:  5' 5"  (165.1 cm)  ?BEHAVIORAL SYMPTOMS/MOOD NEUROLOGICAL BOWEL NUTRITION STATUS  ?         ?AMBULATORY STATUS COMMUNICATION OF NEEDS Skin   ?      ?  ?  ?  ?    ?     ?     ? ? ?Personal Care Assistance Level of Assistance  ?    ?  ?  ?   ? ?Functional Limitations Info  ?    ?  ?   ? ? ?SPECIAL CARE FACTORS FREQUENCY  ?    ?  ?  ?  ?  ?  ?  ?   ? ? ?Contractures    ? ? ?Additional Factors Info  ?    ?  ?  ?  ?  ?   ? ?Current Medications (09/03/2021):  This is the current hospital active medication list ?Current  Facility-Administered Medications  ?Medication Dose Route Frequency Provider Last Rate Last Admin  ? 0.9 %  sodium chloride infusion   Intravenous PRN Bennie Pierini, MD 10 mL/hr at 09/01/21 1900 Infusion Verify at 09/01/21 1900  ? acetaminophen (TYLENOL) tablet 650 mg  650 mg Oral Q6H PRN Agbata, Tochukwu, MD   650 mg at 08/26/21 1616  ? cefTRIAXone (ROCEPHIN) 2 g in sodium chloride 0.9 % 100 mL IVPB  2 g Intravenous Q24H Ottie Glazier, MD   Stopped at 09/02/21 2234  ? chlorhexidine gluconate (MEDLINE KIT) (PERIDEX) 0.12 % solution 15 mL  15 mL Mouth Rinse BID Agbata, Tochukwu, MD   15 mL at 09/03/21 0807  ? Chlorhexidine Gluconate Cloth 2 % PADS 6 each  6 each Topical Q0600 Agbata, Tochukwu, MD   6 each at 09/02/21 2200  ? docusate (COLACE) 50 MG/5ML liquid 100 mg  100 mg Per Tube BID Darel Hong D, NP   100 mg at  08/29/21 4270  ? feeding supplement (NEPRO CARB STEADY) liquid 237 mL  237 mL Oral TID BM Aleskerov, Fuad, MD      ? heparin ADULT infusion 100 units/mL (25000 units/260m)  1,800 Units/hr Intravenous Continuous GDallie Piles RPH 18 mL/hr at 09/03/21 0900 1,800 Units/hr at 09/03/21 0900  ? heparin sodium (porcine) injection 1,000-6,000 Units  1,000-6,000 Units Intracatheter PRN CBenita Gutter RPH   2,800 Units at 08/31/21 2211  ? heparin sodium (porcine) injection 2,800 Units  2,800 Units Intracatheter PRN BColon Flattery NP   2,800 Units at 09/01/21 1024  ? insulin aspart (novoLOG) injection 0-20 Units  0-20 Units Subcutaneous Q4H Agbata, Tochukwu, MD   3 Units at 09/03/21 0749  ? insulin glargine-yfgn (SEMGLEE) injection 20 Units  20 Units Subcutaneous Daily Tukov-Yual, Magdalene S, NP   20 Units at 09/02/21 1646  ? ipratropium-albuterol (DUONEB) 0.5-2.5 (3) MG/3ML nebulizer solution 3 mL  3 mL Nebulization Q4H PRN KDarel HongD, NP      ? MEDLINE mouth rinse  15 mL Mouth Rinse q12n4p AOttie Glazier MD   15 mL at 09/02/21 1200  ? midazolam (VERSED) injection 1 mg  1 mg  Intravenous Q2H PRN KDarel HongD, NP   1 mg at 08/29/21 0203  ? multivitamin with minerals tablet 1 tablet  1 tablet Oral Daily AOttie Glazier MD      ? ondansetron (ZOFRAN) tablet 4 mg  4 mg Oral Q6H PRN Agbata, Tochukwu, MD      ? Or  ? ondansetron (ZOFRAN) injection 4 mg  4 mg Intravenous Q6H PRN Agbata, Tochukwu, MD      ? pantoprazole (PROTONIX) injection 40 mg  40 mg Intravenous Daily KDarel HongD, NP   40 mg at 09/02/21 0900  ? polyethylene glycol (MIRALAX / GLYCOLAX) packet 17 g  17 g Per Tube Daily KDarel HongD, NP   17 g at 08/29/21 06237 ? potassium chloride (KLOR-CON) packet 20 mEq  20 mEq Per Tube Once POswald Hillock RPH      ? sodium chloride flush (NS) 0.9 % injection 10-40 mL  10-40 mL Intracatheter Q12H Rust-Chester, Britton L, NP   10 mL at 09/02/21 2203  ? sodium chloride flush (NS) 0.9 % injection 10-40 mL  10-40 mL Intracatheter PRN Rust-Chester, Britton L, NP   10 mL at 09/02/21 0848  ? torsemide (DEMADEX) tablet 40 mg  40 mg Oral Daily SMurlean Iba MD   40 mg at 09/02/21 1417  ? ? ? ?Discharge Medications: ?Please see discharge summary for a list of discharge medications. ? ?Relevant Imaging Results: ? ?Relevant Lab Results: ? ? ?Additional Information ?  ? ?[place] LCSWA ? ? ? ? ?

## 2021-09-03 NOTE — Assessment & Plan Note (Addendum)
Ejection fraction 45 to 50%.  BNP minimally elevated at 241 in the context of her stage IV chronic kidney disease so would consider her euvolemic or little dry at this point.

## 2021-09-03 NOTE — Progress Notes (Signed)
PHARMACIST - PHYSICIAN COMMUNICATION ? ?CONCERNING: IV to Oral Route Change Policy ? ?RECOMMENDATION: ?This patient is receiving pantoprazole by the intravenous route.  Based on criteria approved by the Pharmacy and Therapeutics Committee, the intravenous medication(s) is/are being converted to the equivalent oral dose form(s). ? ? ?DESCRIPTION: ?These criteria include: ?The patient is eating (either orally or via tube) and/or has been taking other orally administered medications for a least 24 hours ?The patient has no evidence of active gastrointestinal bleeding or impaired GI absorption (gastrectomy, short bowel, patient on TNA or NPO). ? ?If you have questions about this conversion, please contact the Pharmacy Department  ? ?Benita Gutter, RPH ?09/03/2021 1:56 PM  ?

## 2021-09-03 NOTE — TOC Progression Note (Signed)
Transition of Care (TOC) - Progression Note  ? ? ?Patient Details  ?Name: [name] ?MRN: [id number] ?Date of Birth: [birth year] ? ?Transition of Care (TOC) CM/SW Contact  ?[place], LCSWA ?Phone Number: ?09/03/2021, 10:40 AM ? ?Clinical Narrative:    ? ?CSW spoke to patient's cousin [name]  ?([phone]) to let her know the Physical therapist recommended SNF placement. [name] reported preferring [facility]. CSW started bed search. ? ?TOC will continue to follow. ? ?Expected Discharge Plan:  (TBD) ?Barriers to Discharge: Continued Medical Work up ? ?Expected Discharge Plan and Services ?Expected Discharge Plan:  (TBD) ?  ?Discharge Planning Services: CM Consult ?  ?Living arrangements for the past 2 months: Apartment ?                ?DME Arranged: N/A ?DME Agency: NA ?  ?  ?  ?HH Arranged: RN ?[place] Agency: [employer] ?Date HH Agency Contacted: 08/26/21 ?Time [place]: 6060 ?  ? ? ?Social Determinants of Health (SDOH) Interventions ?  ? ?Readmission Risk Interventions ?No flowsheet data found. ? ?

## 2021-09-03 NOTE — Progress Notes (Signed)
Patient transferred to room 130 on 1C. Patient alert with no distress noted. Central tele called and tele verified with this RN.  ?

## 2021-09-03 NOTE — Evaluation (Addendum)
Dysphagia TX ?Patient Details  ?Name: [name] ?MRN: [id number] ?Date of Birth: 10-14-1950 ? ?Today's Date: 09/03/2021 ?Time: SLP Start Time (ACUTE ONLY): 0915 SLP Stop Time (ACUTE ONLY): 6767 ?SLP Time Calculation (min) (ACUTE ONLY): 44 min ? ?Past Medical History:  ?Past Medical History:  ?Diagnosis Date  ? Bacteremia   ? CAD (coronary artery disease)   ? Diabetes mellitus without complication ([place])   ? Diastolic CHF ([place])   ? Hyperlipemia   ? Hypertension   ? Lymphedema   ? MI (myocardial infarction) ([place])   ? OSA (obstructive sleep apnea)   ? Severe aortic stenosis   ? ?Past Surgical History:  ?Past Surgical History:  ?Procedure Laterality Date  ? CARDIAC SURGERY    ? CYSTOSCOPY W/ URETERAL STENT PLACEMENT  08/25/2021  ? Procedure: CYSTOSCOPY WITH RETROGRADE PYELOGRAM/URETERAL STENT PLACEMENT;  Surgeon: Billey Co, MD;  Location: [facility] ORS;  Service: Urology;;  ? none    ? PACEMAKER INSERTION    ? ?HPI:  ?Per admitting H&P "[name] is a 71 y.o. female with medical history significant for morbid obesity (BMI 57), obesity hypoventilation syndrome, diabetes mellitus, coronary artery disease, hypertension, obstructive sleep apnea who presents to the emergency room via EMS for evaluation of change in mental status.  Patient is unable to provide any history and continues to moan out in pain asking for help.  Home health had called 911 because patient was altered.  Vital signs per EMS showed a Tmax of 98.1, tachycardia with heart rate of 124, respiratory rate 27, room air pulse oximetry of 86% (she was initially on 6 L of oxygen but has been weaned down to 2 L, blood pressure 90/70.  In the ER she received 2.5 L of LR with improvement in her blood pressure.  Labs show worsening of her renal function from baseline, she has a serum creatinine of 2.31 compared to 1.6 with a potassium of 3.4.  Lactic acid was 4.3, white count normal at 8.9.  I am unable to do review of systems on this patient due to her  mental status.  She received a dose of IV vancomycin, cefepime and Flagyl."                                                                                                  Pt underwent surgery and was not able to be extubated until for a few days.  ?  ?Assessment / Plan / Recommendation  ?Clinical Impression ?  Pt visited for ongoing dysphagia assessment during treatment session. Pt seen for breakfast meal. Hand over hand assist with feeding of pureed items. Pt tolerated pureed foods well but needed extended time to manipulate and propel for a swallow. Min to no oral residue after the swallow. Pt also tolerated nectar thick liquids well. Noted coughing x1 after taking multiple sips in a row. Needed cues to take single sips. Pt fatigued easily and needed periods of rest. Ate ~25 percent of meal. Ice chips given prior to meal. Rec continue with current Dys 1 diet for now. ST to follow up with  toleration of diet and reassess with thin. Pt has upper dentures in room. Will also further assess with soft solids once strength and endurance has improved. ? ?SLP Visit Diagnosis: Dysphagia, oropharyngeal phase (R13.12) ?   ?Aspiration Risk ?   moderate ?  ?Diet Recommendation   Dys 1 with nectar thick. Ice chips but not during meal ? ?Medication Administration: Crushed with puree ?Compensations: Minimize environmental distractions;Slow rate;Small sips/bites  ?  ?Other  Recommendations Oral Care Recommendations: Oral care BID;Oral care prior to ice chip/H20   ? ?Recommendations for follow up therapy are one component of a multi-disciplinary discharge planning process, led by the attending physician.  Recommendations may be updated based on patient status, additional functional criteria and insurance authorization. ? ?Follow up Recommendations Skilled nursing-short term rehab (<3 hours/day)  ? ? ?  ?Assistance Recommended at Discharge Frequent or constant Supervision/Assistance  ?Functional Status Assessment    ?Frequency and  Duration    ? 3X week ?  ?   ? ?Prognosis   Good ? ?  ? ?Swallow Study   ?General HPI: Per admitting H&P "[name] is a 71 y.o. female with medical history significant for morbid obesity (BMI 57), obesity hypoventilation syndrome, diabetes mellitus, coronary artery disease, hypertension, obstructive sleep apnea who presents to the emergency room via EMS for evaluation of change in mental status.  Patient is unable to provide any history and continues to moan out in pain asking for help.  Home health had called 911 because patient was altered.  Vital signs per EMS showed a Tmax of 98.1, tachycardia with heart rate of 124, respiratory rate 27, room air pulse oximetry of 86% (she was initially on 6 L of oxygen but has been weaned down to 2 L, blood pressure 90/70.  In the ER she received 2.5 L of LR with improvement in her blood pressure.  Labs show worsening of her renal function from baseline, she has a serum creatinine of 2.31 compared to 1.6 with a potassium of 3.4.  Lactic acid was 4.3, white count normal at 8.9.  I am unable to do review of systems on this patient due to her mental status.  She received a dose of IV vancomycin, cefepime and Flagyl."                                                                                                  Pt underwent surgery and was not able to be extubated until for a few days.  ?  ?Oral/Motor/Sensory Function     ?Ice Chips   WFL  ?Thin Liquid   DNT ?  ?Nectar Thick  Tolerating well  ?Honey Thick   DNT  ?Puree   Slow oral transit, tolerating well  ?Solid ? ? ?    DNT ? ?  ? ?Lucila Maine ?09/03/2021,10:00 AM ? ? ? ?

## 2021-09-04 LAB — CBC
HCT: 35.2 % — ABNORMAL LOW (ref 36.0–46.0)
Hemoglobin: 10.8 g/dL — ABNORMAL LOW (ref 12.0–15.0)
MCH: 28.7 pg (ref 26.0–34.0)
MCHC: 30.7 g/dL (ref 30.0–36.0)
MCV: 93.6 fL (ref 80.0–100.0)
Platelets: 122 10*3/uL — ABNORMAL LOW (ref 150–400)
RBC: 3.76 MIL/uL — ABNORMAL LOW (ref 3.87–5.11)
RDW: 15.8 % — ABNORMAL HIGH (ref 11.5–15.5)
WBC: 11 10*3/uL — ABNORMAL HIGH (ref 4.0–10.5)
nRBC: 0 % (ref 0.0–0.2)

## 2021-09-04 LAB — RENAL FUNCTION PANEL
Albumin: 2.6 g/dL — ABNORMAL LOW (ref 3.5–5.0)
Anion gap: 10 (ref 5–15)
BUN: 61 mg/dL — ABNORMAL HIGH (ref 8–23)
CO2: 27 mmol/L (ref 22–32)
Calcium: 8.3 mg/dL — ABNORMAL LOW (ref 8.9–10.3)
Chloride: 109 mmol/L (ref 98–111)
Creatinine, Ser: 1.89 mg/dL — ABNORMAL HIGH (ref 0.44–1.00)
GFR, Estimated: 28 mL/min — ABNORMAL LOW (ref >60)
Glucose, Bld: 167 mg/dL — ABNORMAL HIGH (ref 70–99)
Phosphorus: 3 mg/dL (ref 2.5–4.6)
Potassium: 3.5 mmol/L (ref 3.5–5.1)
Sodium: 146 mmol/L — ABNORMAL HIGH (ref 135–145)

## 2021-09-04 LAB — GLUCOSE, CAPILLARY
Glucose-Capillary: 121 mg/dL — ABNORMAL HIGH (ref 70–99)
Glucose-Capillary: 145 mg/dL — ABNORMAL HIGH (ref 70–99)
Glucose-Capillary: 150 mg/dL — ABNORMAL HIGH (ref 70–99)
Glucose-Capillary: 192 mg/dL — ABNORMAL HIGH (ref 70–99)
Glucose-Capillary: 198 mg/dL — ABNORMAL HIGH (ref 70–99)
Glucose-Capillary: 205 mg/dL — ABNORMAL HIGH (ref 70–99)
Glucose-Capillary: 231 mg/dL — ABNORMAL HIGH (ref 70–99)

## 2021-09-04 LAB — MAGNESIUM: Magnesium: 2.2 mg/dL (ref 1.7–2.4)

## 2021-09-04 LAB — BRAIN NATRIURETIC PEPTIDE: B Natriuretic Peptide: 241.6 pg/mL — ABNORMAL HIGH (ref 0.0–100.0)

## 2021-09-04 MED ORDER — POTASSIUM CHLORIDE 20 MEQ PO PACK
20.0000 meq | PACK | Freq: Once | ORAL | Status: AC
Start: 2021-09-04 — End: 2021-09-04
  Administered 2021-09-04: 20 meq via ORAL
  Filled 2021-09-04: qty 1

## 2021-09-04 MED ORDER — POLYETHYLENE GLYCOL 3350 17 G PO PACK
17.0000 g | PACK | Freq: Every day | ORAL | Status: DC
  Administered 2021-09-04 – 2021-09-05 (×2): 17 g via ORAL
  Filled 2021-09-04 (×2): qty 1

## 2021-09-04 NOTE — TOC Progression Note (Signed)
Transition of Care (TOC) - Progression Note  ? ? ?Patient Details  ?Name: [name] ?MRN: [id number] ?Date of Birth: [birth year] ? ?Transition of Care (TOC) CM/SW Contact  ?Destine Ambroise E Rithik Odea, LCSW ?Phone Number: ?09/04/2021, 9:06 AM ? ?Clinical Narrative:   Patient currently has no bed offers. Reached out to [name] in Admissions at [facility] and asked her to review referral since this is patient's first choice per notes.  ? ? ? ?Expected Discharge Plan:  (TBD) ?Barriers to Discharge: Continued Medical Work up ? ?Expected Discharge Plan and Services ?Expected Discharge Plan:  (TBD) ?  ?Discharge Planning Services: CM Consult ?  ?Living arrangements for the past 2 months: Apartment ?                ?DME Arranged: N/A ?DME Agency: NA ?  ?  ?  ?HH Arranged: RN ?[place] Agency: [employer] ?Date HH Agency Contacted: 08/26/21 ?Time [place]: 1660 ?  ? ? ?Social Determinants of Health (SDOH) Interventions ?  ? ?Readmission Risk Interventions ?No flowsheet data found. ? ?

## 2021-09-04 NOTE — Progress Notes (Signed)
[facility] Progress Note ? ?Patient: [name]    ZOX:[id number]  DOA: 08/25/2021    ?Date of Service: the patient was seen and examined on 09/04/2021 ? ?Brief hospital course: ?Patient is a 71 year old female with past medical history of morbid obesity, obstructive sleep apnea, diabetes mellitus, severe aortic stenosis status post TAVR and systolic heart failure with an ejection fraction of 45-50% admitted on 3/2 for UTI with septic shock in the setting of bilateral ureteral stones causing obstruction.  Patient was taken for urgent cystoscopy with bilateral stent placement and came back to the ICU postprocedure intubated.  Urology plans to do outpatient bilateral ureteroscopy with laser lithotripsy in 1 to 2 months. ? ?Septic shock and urinary obstruction caused the patient's stage IIIb chronic kidney disease to worsen.  Nephrology consulted and patient started on CRRT which was discontinued on 3/6.  Ultrafiltration only treatment on 3/9 with IV albumin given.  Patient able to be weaned off of ventilator and pressor support.  Blood and urine cultures positive for pansensitive E. coli.  By 3/10, patient's mentation slightly improving and she was transferred to the hospitalist service. ?Patient's renal function continues to improve and nephrology feels she no longer needs hemodialysis. ? ? ?Assessment and Plan: ?Assessment and Plan: ?* Septic shock due to Escherichia coli [facility]) ?Patient met criteria for septic shock on admission given lactic acid level of 4.3, urinary tract infection source, cultures that grew out E. coli as well as on refractory hypotension requiring pressor support, tachycardia and tachypnea.  Sepsis now stabilized.  Patient weaned off of pressor support and completed 10 days of antibiotics on 3/11. ? ?Obstructive uropathy ?Patient noted to have obstructive uropathy with 2 6 mm stones in the proximal right ureter resulting in right-sided hydronephrosis as well as left-sided  hydronephrosis.  Appreciate urology help with status post bilateral stent placement.  Plan is for follow-up cystoscopy to remove stents and laser lithotripsy in a few months ? ?Acute renal failure superimposed on stage 3b chronic kidney disease ([place]) ?Most likely secondary to ATN from hypotension.  Initially on CRRT and then ultrafiltration.  Nephrology following.  Urine output increasing.  Creatinine today at at 1.89 with GFR of 28, very close to baseline.  Nephrology feels HD no longer needed.  Plan to remove dialysis catheter tomorrow. ? ?Hypernatremia ?Sodium peaked on 3/11 at 147, down today to 146.  Likely due to decreased mentation and poor p.o. intake.  As her mentation improves and she has good urine output, should be able to take p.o.  May need to give some gentle IV fluids if her sodium goes back up.  Will continue to follow. ? ?Acute metabolic encephalopathy ?Secondary to septic shock and uremia.  Baseline is awake and alert and oriented.  Patient is starting to slowly regain mentation more and more.  Currently on dysphagia diet. ? ?Chronic systolic CHF (congestive heart failure) ([place]) ?Ejection fraction 45 to 50%.  BNP minimally elevated at 241 in the context of her stage IV chronic kidney disease so would consider her euvolemic or little dry at this point. ? ?Paroxysmal atrial fibrillation ([place]) ?History of paroxysmal atrial fibrillation.  Currently in atrial fibrillation.  Heart rate currently well controlled.  On heparin infusion and when she is more awake, will restart Eliquis if renal function improves.  Her home amiodarone is also on hold although although reportedly, she has not been taking this medication. ? ?Diabetes mellitus without complication ([place]) ?A1c of 7.6.  Currently on Lantus and sliding  scale.  CBGs in the 120s to 170s. ? ?Obstructive sleep apnea ?Patient most likely has underlying obstructive sleep apnea related to morbid obesity as well as possible obesity hypoventilation  syndrome. ?Her pulse oximetry drops in the low 80s while asleep.  We will see about starting nightly CPAP. ? ?Hypertension ?Now that she is weaned off of pressor support, blood pressure starting to trend back upwards.  We will slowly restart her home medications.  Currently just on torsemide.  ? ?Acute respiratory failure with hypoxia and hypercapnia (HCC)-resolved as of 09/03/2021 ?Secondary to septic shock.  Initially requiring ventilator support, now resolved and off of vent.  Currently on 2L nasal cannula. ? ?Morbid obesity ([employer]) ?Meets criteria with BMI greater than 50 ?Complicates overall prognosis and care ? ?Severe aortic stenosis ?Status post TAVR ? ?CAD (coronary artery disease) ?Stable ?Continue statins ?Hold metoprolol due to hypotension ? ? ? ? ? ? ?Body mass index is 52.76 kg/m?Marland Kitchen  ?Nutrition Problem: Inadequate oral intake ?Etiology: inability to eat (pt sedated and ventilated) ?Pressure Injury 05/02/16 Stage I -  Intact skin with non-blanchable redness of a localized area usually over a bony prominence. (Active)  ?05/02/16 1400  ?Location: Buttocks  ?Location Orientation: Medial  ?Staging: Stage I -  Intact skin with non-blanchable redness of a localized area usually over a bony prominence.  ?Wound Description (Comments):   ?Present on Admission: Yes  ?  ? ?Consultants: ?Critical care ?Nephrology ?Urology ? ?Procedures: ?Ventilator from 3/2-3/9 ? ?Antimicrobials: ?IV cefepime 3/2 x 1 dose ?IV Rocephin 3/2 - 3/11 ? ?Code Status: Full code ? ? ?Subjective: Still somewhat fatigued ?Objective: ?Noted borderline bradycardia ?Vitals:  ? 09/04/21 1130 09/04/21 1527  ?BP: 135/63 (!) 136/55  ?Pulse: 61 (!) 59  ?Resp: 20 20  ?Temp: 98.9 ?F (37.2 ?C) 97.7 ?F (36.5 ?C)  ?SpO2: 91% 92%  ? ? ?Intake/Output Summary (Last 24 hours) at 09/04/2021 1559 ?Last data filed at 09/04/2021 1358 ?Gross per 24 hour  ?Intake 569.85 ml  ?Output 2300 ml  ?Net -1730.15 ml  ? ? ?Filed Weights  ? 09/02/21 0500 09/03/21 0500 09/04/21  0450  ?Weight: (!) 144.5 kg (!) 145.8 kg (!) 143.8 kg  ? ?Body mass index is 52.76 kg/m?. ? ?Exam: ? ?General: Somnolent, no acute distress ?HEENT: Normocephalic, atraumatic, narrow airway ?Cardiovascular: irregular rhythm, borderline bradycardia ?Respiratory: Decreased breath sounds throughout secondary to body habitus ?Abdomen: Soft, obese, nontender, hypoactive bowel sounds ?Musculoskeletal: No clubbing or cyanosis, 1+ pitting edema ?Skin: No skin breaks, tears or lesions ?Psychiatry: Some delirium although slowly improving ?Neurology: No overt focal deficits although currently limited due to patient's delirium ? ?Data Reviewed: ?Noted improvement in sodium.  Minimal BNP at 241 ? ?Disposition:  ?Status is: Inpatient ?Remains inpatient appropriate because: Removal of dialysis catheter.  Working with PT.  Anticipate discharge in the next 1 to 2 days ?  ? ?Family Communication: Left message for family ?DVT Prophylaxis: ? ?apixaban (ELIQUIS) tablet 5 mg  ? ? ?Author: ?Annita Brod ,MD ?09/04/2021 3:59 PM ? ?To reach On-call, see care teams to locate the attending and reach out via [email or web address]. ?Between 7PM-7AM, please contact night-coverage ?If you still have difficulty reaching the attending provider, please page the [facility] (Director on Call) for [facility] on amion for assistance. ? ?

## 2021-09-04 NOTE — Consult Note (Signed)
PHARMACY CONSULT NOTE - FOLLOW UP ? ?Pharmacy Consult for Electrolyte Monitoring and Replacement  ? ?Recent Labs: ?Potassium (mmol/L)  ?Date Value  ?09/04/2021 3.5  ? ?Magnesium (mg/dL)  ?Date Value  ?09/04/2021 2.2  ? ?Calcium (mg/dL)  ?Date Value  ?09/04/2021 8.3 (L)  ? ?Albumin (g/dL)  ?Date Value  ?09/04/2021 2.6 (L)  ? ?Phosphorus (mg/dL)  ?Date Value  ?09/04/2021 3.0  ? ?Sodium (mmol/L)  ?Date Value  ?09/04/2021 146 (H)  ? ? ? ?Assessment: ?71 yo F w/ h/o HFpEF, Severe AS, CAD (AC-paced), HTN, BMI 57, DM, severe urosepsis with septic shock ISO Rt prox ureteral stone w/ hydronephrosis & large Lt renal stone c/b AKI, acute metabolic encephalopathy. S/p cytoscopy B/L ureteral stents on 3/02. Pharmacy consulted for the mgmt of electrolytes. On torsemide 40 mg daily.  ? ?Goal of Therapy:  ?Lytes WNL ? ?Plan:  ?Kcl 20 mEq x 1  ?F/u with AM labs.  ? ?Eleonore Chiquito, PharmD, BCPS ?Clinical Pharmacist ?09/04/2021 7:41 AM ? ? ?

## 2021-09-04 NOTE — Plan of Care (Signed)

## 2021-09-04 NOTE — Progress Notes (Signed)
[facility] °[place], [place] °09/04/21 ° °Subjective:  ° °Hospital day # 10 ° °Cr slightly down to 1.89 now.   °Good UOP of 2.7.  °Still on nectar thickened fluids.  ° °03/11 0701 - 03/12 0700 °In: 311.9 [P.O.:150; I.V.:161.9] °Out: 2725 [Urine:2725] °Lab Results  °Component Value Date  ° CREATININE 1.89 (H) 09/04/2021  ° CREATININE 1.90 (H) 09/03/2021  ° CREATININE 1.98 (H) 09/02/2021  ° ° °Objective:  °Vital signs in last 24 hours:  °Temp:  [97.8 °F (36.6 °C)-98.9 °F (37.2 °C)] 98.9 °F (37.2 °C) (03/12 1130) °Pulse Rate:  [57-72] 61 (03/12 1130) °Resp:  [14-24] 20 (03/12 1130) °BP: (95-146)/(49-95) 135/63 (03/12 1130) °SpO2:  [91 %-97 %] 91 % (03/12 1130) °Weight:  [143.8 kg] 143.8 kg (03/12 0450) ° °Weight change: -2 kg °Filed Weights  ° 09/02/21 0500 09/03/21 0500 09/04/21 0450  °Weight: (!) 144.5 kg (!) 145.8 kg (!) 143.8 kg  ° ° °Intake/Output: °  ° °Intake/Output Summary (Last 24 hours) at 09/04/2021 1439 °Last data filed at 09/04/2021 1358 °Gross per 24 hour  °Intake 629.85 ml  °Output 3300 ml  °Net -2670.15 ml  ° ° ° ° °Physical Exam: °General No acute distress  °HEENT [place] O2  °Pulm/lungs Bilateral rales, normal effort  °CVS/Heart Irregular, paced rhythm  °Abdomen:  Soft NTND  °Extremities: 1+ lower extremity edema  °Neurologic: Arousable  °Access: Left IJ temporary dialysis catheter  °GU: Foley in place with small amount of dark urine  ° ° °Basic Metabolic Panel: ° °Recent Labs  °Lab 08/31/21 °0446 09/01/21 °0444 09/02/21 °0455 09/03/21 °0435 09/04/21 °0413  °NA 139 142 145 147* 146*  °K 3.7 3.7 3.8 3.6 3.5  °CL 103 106 109 110 109  °CO2 27 26 29 28 27  °GLUCOSE 195* 154* 118* 111* 167*  °BUN 54* 62* 60* 61* 61*  °CREATININE 2.06* 2.16* 1.98* 1.90* 1.89*  °CALCIUM 8.1* 7.8* 7.9* 8.1* 8.3*  °MG 2.4 2.3 2.4 2.1 2.2  °PHOS 3.4 3.7 3.7 3.9 3.0  ° ° ° ° °CBC: °Recent Labs  °Lab 08/31/21 °0446 09/01/21 °0444 09/02/21 °0455 09/03/21 °0435 09/04/21 °0413  °WBC 16.0* 14.8* 13.0* 13.3* 11.0*  °HGB  11.1* 10.7* 10.4* 10.5* 10.8*  °HCT 35.0* 34.6* 33.8* 34.7* 35.2*  °MCV 91.4 93.3 94.2 97.2 93.6  °PLT 91* 95* 104* 117* 122*  ° ° ° °  °Lab Results  °Component Value Date  ° HEPBSAG NON REACTIVE 08/31/2021  ° HEPBSAB NON REACTIVE 08/31/2021  ° ° ° ° °Microbiology: ° °Recent Results (from the past 240 hour(s))  °Urine Culture     Status: Abnormal  ° Collection Time: 08/25/21  1:47 PM  ° Specimen: In/Out Cath Urine  °Result Value Ref Range Status  ° Specimen Description   Final  °  IN/OUT CATH URINE °Performed at [facility], [address] [place], [place] [address] °  ° Special Requests   Final  °  NONE °Performed at [facility], [address] [place], [place] [address] °  ° Culture >=100,000 COLONIES/mL ESCHERICHIA COLI (A)  Final  ° Report Status 08/28/2021 FINAL  Final  ° Organism ID, Bacteria ESCHERICHIA COLI (A)  Final  °    Susceptibility  ° Escherichia coli - MIC*  °  AMPICILLIN 8 SENSITIVE Sensitive   °  CEFAZOLIN <=4 SENSITIVE Sensitive   °  CEFEPIME <=0.12 SENSITIVE Sensitive   °  CEFTRIAXONE <=0.25 SENSITIVE Sensitive   °  CIPROFLOXACIN <=0.25 SENSITIVE Sensitive   °    GENTAMICIN <=1 SENSITIVE Sensitive   °  IMIPENEM <=0.25 SENSITIVE Sensitive   °  NITROFURANTOIN <=16 SENSITIVE Sensitive   °  TRIMETH/SULFA <=20 SENSITIVE Sensitive   °  AMPICILLIN/SULBACTAM <=2 SENSITIVE Sensitive   °  PIP/TAZO <=4 SENSITIVE Sensitive   °  * >=100,000 COLONIES/mL ESCHERICHIA COLI  °Resp Panel by RT-PCR (Flu A&B, Covid) Nasopharyngeal Swab     Status: None  ° Collection Time: 08/25/21  3:05 PM  ° Specimen: Nasopharyngeal Swab; Nasopharyngeal(NP) swabs in vial transport medium  °Result Value Ref Range Status  ° SARS Coronavirus 2 by RT PCR NEGATIVE NEGATIVE Final  °  Comment: (NOTE) °SARS-CoV-2 target nucleic acids are NOT DETECTED. ° °The SARS-CoV-2 RNA is generally detectable in upper respiratory °specimens during the acute phase of infection. The lowest °concentration of SARS-CoV-2 viral copies  this assay can detect is °138 copies/mL. A negative result does not preclude SARS-Cov-2 °infection and should not be used as the sole basis for treatment or °other patient management decisions. A negative result may occur with  °improper specimen collection/handling, submission of specimen other °than nasopharyngeal swab, presence of viral mutation(s) within the °areas targeted by this assay, and inadequate number of viral °copies(<138 copies/mL). A negative result must be combined with °clinical observations, patient history, and epidemiological °information. The expected result is Negative. ° °Fact Sheet for Patients:  °https://www.fda.gov/media/152166/download ° °Fact Sheet for Healthcare Providers:  °https://www.fda.gov/media/152162/download ° °This test is no t yet approved or cleared by the [place] FDA and  °has been authorized for detection and/or diagnosis of SARS-CoV-2 by °FDA under an Emergency Use Authorization (EUA). This EUA will remain  °in effect (meaning this test can be used) for the duration of the °COVID-19 declaration under Section 564(b)(1) of the Act, 21 °U.S.C.section 360bbb-3(b)(1), unless the authorization is terminated  °or revoked sooner.  ° ° °  ° Influenza A by PCR NEGATIVE NEGATIVE Final  ° Influenza B by PCR NEGATIVE NEGATIVE Final  °  Comment: (NOTE) °The Xpert Xpress SARS-CoV-2/FLU/RSV plus assay is intended as an aid °in the diagnosis of influenza from Nasopharyngeal swab specimens and °should not be used as a sole basis for treatment. Nasal washings and °aspirates are unacceptable for Xpert Xpress SARS-CoV-2/FLU/RSV °testing. ° °Fact Sheet for Patients: °https://www.fda.gov/media/152166/download ° °Fact Sheet for Healthcare Providers: °https://www.fda.gov/media/152162/download ° °This test is not yet approved or cleared by the [place] FDA and °has been authorized for detection and/or diagnosis of SARS-CoV-2 by °FDA under an Emergency Use Authorization (EUA). This EUA  will remain °in effect (meaning this test can be used) for the duration of the °COVID-19 declaration under Section 564(b)(1) of the Act, 21 U.S.C. °section 360bbb-3(b)(1), unless the authorization is terminated or °revoked. ° °Performed at [facility], [address] [place], °[place] [address] °  °MRSA Next Gen by PCR, Nasal     Status: None  ° Collection Time: 08/25/21  6:44 PM  ° Specimen: Nasal Mucosa; Nasal Swab  °Result Value Ref Range Status  ° MRSA by PCR Next Gen NOT DETECTED NOT DETECTED Final  °  Comment: (NOTE) °The GeneXpert MRSA Assay (FDA approved for NASAL specimens only), °is one component of a comprehensive MRSA colonization surveillance °program. It is not intended to diagnose MRSA infection nor to guide °or monitor treatment for MRSA infections. °Test performance is not FDA approved in patients less than 2 years °old. °Performed at [facility], [address] [place], °[place] [address] °  °Blood Culture (routine x 2)       Status: None   Collection Time: 08/25/21  7:38 PM   Specimen: BLOOD  Result Value Ref Range Status   Specimen Description BLOOD RIGHT HAND  Final   Special Requests   Final    BOTTLES DRAWN AEROBIC AND ANAEROBIC Blood Culture adequate volume   Culture   Final    NO GROWTH 5 DAYS Performed at [facility], [address], [place], [place] [address]    Report Status 08/30/2021 FINAL  Final  Culture, Respiratory w Gram Stain     Status: None   Collection Time: 08/26/21  8:21 AM   Specimen: Tracheal Aspirate; Respiratory  Result Value Ref Range Status   Specimen Description   Final    TRACHEAL ASPIRATE Performed at [facility], [address] [place], [place] [address]    Special Requests   Final    NONE Performed at [facility], [address] [place], [place] [address]    Gram Stain   Final    RARE SQUAMOUS EPITHELIAL CELLS PRESENT RARE WBC PRESENT, PREDOMINANTLY MONONUCLEAR NO ORGANISMS SEEN     Culture   Final    RARE Normal respiratory flora-no Staph aureus or Pseudomonas seen Performed at [facility], [address] [place], [place] [address]    Report Status 08/29/2021 FINAL  Final    Coagulation Studies: No results for input(s): LABPROT, INR in the last 72 hours.   Urinalysis: No results for input(s): COLORURINE, LABSPEC, PHURINE, GLUCOSEU, HGBUR, BILIRUBINUR, KETONESUR, PROTEINUR, UROBILINOGEN, NITRITE, LEUKOCYTESUR in the last 72 hours.  Invalid input(s): APPERANCEUR    Imaging: No results found.   Medications:    sodium chloride 10 mL/hr at 09/01/21 1900    apixaban  5 mg Oral BID   chlorhexidine gluconate (MEDLINE KIT)  15 mL Mouth Rinse BID   Chlorhexidine Gluconate Cloth  6 each Topical Q0600   docusate sodium  100 mg Oral BID   feeding supplement (NEPRO CARB STEADY)  237 mL Oral TID BM   insulin aspart  0-20 Units Subcutaneous Q4H   insulin glargine-yfgn  20 Units Subcutaneous Daily   mouth rinse  15 mL Mouth Rinse q12n4p   multivitamin with minerals  1 tablet Oral Daily   pantoprazole  40 mg Oral Daily   polyethylene glycol  17 g Oral Daily   sodium chloride flush  10-40 mL Intracatheter Q12H   torsemide  40 mg Oral Daily   sodium chloride, acetaminophen, heparin sodium (porcine), ipratropium-albuterol, ondansetron **OR** ondansetron (ZOFRAN) IV, sodium chloride flush  Assessment/ Plan:  71 y.o. 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  Admit creatinine of 2.31 that  peaked at 4.47.  CRRT discontinued 08/29/2021.  UF only treatment on 09/01/2021 net UF 1.5 L.  03/11 0701 - 03/12 0700 In: 311.9 [P.O.:150; I.V.:161.9] Out: 2725 [Urine:2725]    - No further need for HD, consider removing temporary dialysis catheter tomorrow.     #Acute respiratory failure Extubated on March 6 Breathing comfortably.   # Sepsis with UTI, hypotension, shock E Coli Pyelonephritis and bacteremia  #  Nephrolithiasis with rt proximal stone with hydronephrosis and left UPJ stone without hydronephrosis. On 3/2, patient underwent cystoscopy with bilateral ureteral stents placed by Dr. Diamantina Providence. Patient was found to have purulent urine in bladder. Plan for outpatient b/l uretereroscopy/laser lithotripsy in 1-2 months Small stone retrieved by nursing.  Sent for analysis.      LOS: 10 [name] 3/12/20232:39 PM  [place], [place]  Note: This note was prepared with Dragon dictation. Any transcription errors are unintentional

## 2021-09-04 NOTE — Progress Notes (Signed)
Attempted to place patient on night time bipap. Patient wore bipap for approximately 30 mins and pulled the mask off. Patient refuses and will not leave mask in place.  ?

## 2021-09-04 NOTE — Progress Notes (Signed)
Speech Language Pathology Treatment:    ?Patient Details ?Name: [name] ?MRN: [id number] ?DOB: Oct 20, 1950 ?Today's Date: 09/04/2021 ?Time: 1005-1025 ?SLP Time Calculation (min) (ACUTE ONLY): 20 min ? ?Assessment / Plan / Recommendation ?Clinical Impression ? Pt seen for diet tolerance and trials of upgraded thin liquids. Pt observed with puree, nectar-thick liquids (via cup), and thin liquids (cup sip). Oral phase was functional liquids. Mildly prolonged oral prep noted with pureed. Solid trial deferred at this time given oral difficulty with pureed textures and overall deconditioning. Pt continues to demonstrate s/sx concerning for highly suspected pharyngeal dysphagia with thin liquids including immediate throat clear with single sips and immediate/prolonged coughing with unprompted consecutive sips as well as increased RR following sips of thin liquids. Of note, pt needed cues throughout session for single sips of liquids.  ? ?Recommend continuation of Dysphagia 1 Diet with Nectar-thick Liquids and safe swallowing strategies/aspiration precautions as outlined below.  ? ?SLP to f/u per POC for trials of upgraded textures. Pt may benefit from instrumental evaluation of swallowing if s/sx persist.  ? ?Pt and RN made aware of results, recommendations, and SLP POC. Pt verbalized understanding/agreement.  ?  ?HPI HPI: Per admitting H&P "[name] is a 71 y.o. female with medical history significant for morbid obesity (BMI 57), obesity hypoventilation syndrome, diabetes mellitus, coronary artery disease, hypertension, obstructive sleep apnea who presents to the emergency room via EMS for evaluation of change in mental status.  Patient is unable to provide any history and continues to moan out in pain asking for help.  Home health had called 911 because patient was altered.  Vital signs per EMS showed a Tmax of 98.1, tachycardia with heart rate of 124, respiratory rate 27, room air pulse oximetry of 86%  (she was initially on 6 L of oxygen but has been weaned down to 2 L, blood pressure 90/70.  In the ER she received 2.5 L of LR with improvement in her blood pressure.  Labs show worsening of her renal function from baseline, she has a serum creatinine of 2.31 compared to 1.6 with a potassium of 3.4.  Lactic acid was 4.3, white count normal at 8.9.  I am unable to do review of systems on this patient due to her mental status.  She received a dose of IV vancomycin, cefepime and Flagyl."                                                                                                  Pt underwent surgery and was not able to be extubated until for a few days. ?  ?   ?SLP Plan ? Continue with current plan of care ? ?  ?  ?Recommendations for follow up therapy are one component of a multi-disciplinary discharge planning process, led by the attending physician.  Recommendations may be updated based on patient status, additional functional criteria and insurance authorization. ?  ? ?Recommendations  ?Diet recommendations: Dysphagia 1 (puree);Nectar-thick liquid ?Supervision: Staff to assist with self feeding ?Compensations: Minimize environmental distractions;Slow rate;Small sips/bites ?Postural Changes and/or Swallow Maneuvers: Seated upright 90 degrees  ?   ?    ?   ? ? ? ?  Oral Care Recommendations: Oral care BID;Oral care prior to ice chip/H20 ?Follow Up Recommendations: Skilled nursing-short term rehab (<3 hours/day) ?Assistance recommended at discharge: Frequent or constant Supervision/Assistance ?SLP Visit Diagnosis: Dysphagia, oropharyngeal phase (R13.12) ?Plan: Continue with current plan of care ? ? ? ? ?  ?  ? ?Cherrie Gauze, M.S., CCC-SLP ?Speech-Language Pathologist ?[facility] ?([phone] ([place])  ? ?Quintella Baton ? ?09/04/2021, 10:44 AM ?

## 2021-09-05 DIAGNOSIS — J9602 Acute respiratory failure with hypercapnia: Secondary | ICD-10-CM

## 2021-09-05 DIAGNOSIS — J9601 Acute respiratory failure with hypoxia: Secondary | ICD-10-CM

## 2021-09-05 LAB — RENAL FUNCTION PANEL
Albumin: 2.6 g/dL — ABNORMAL LOW (ref 3.5–5.0)
Anion gap: 11 (ref 5–15)
BUN: 57 mg/dL — ABNORMAL HIGH (ref 8–23)
CO2: 30 mmol/L (ref 22–32)
Calcium: 8.7 mg/dL — ABNORMAL LOW (ref 8.9–10.3)
Chloride: 107 mmol/L (ref 98–111)
Creatinine, Ser: 1.83 mg/dL — ABNORMAL HIGH (ref 0.44–1.00)
GFR, Estimated: 29 mL/min — ABNORMAL LOW (ref >60)
Glucose, Bld: 124 mg/dL — ABNORMAL HIGH (ref 70–99)
Phosphorus: 2.9 mg/dL (ref 2.5–4.6)
Potassium: 3.7 mmol/L (ref 3.5–5.1)
Sodium: 148 mmol/L — ABNORMAL HIGH (ref 135–145)

## 2021-09-05 LAB — GLUCOSE, CAPILLARY
Glucose-Capillary: 128 mg/dL — ABNORMAL HIGH (ref 70–99)
Glucose-Capillary: 100 mg/dL — ABNORMAL HIGH (ref 70–99)
Glucose-Capillary: 160 mg/dL — ABNORMAL HIGH (ref 70–99)
Glucose-Capillary: 145 mg/dL — ABNORMAL HIGH (ref 70–99)
Glucose-Capillary: 202 mg/dL — ABNORMAL HIGH (ref 70–99)
Glucose-Capillary: 164 mg/dL — ABNORMAL HIGH (ref 70–99)

## 2021-09-05 LAB — MAGNESIUM: Magnesium: 2 mg/dL (ref 1.7–2.4)

## 2021-09-05 MED ORDER — SODIUM CHLORIDE 0.45 % IV SOLN: INTRAVENOUS | Status: DC

## 2021-09-05 NOTE — Progress Notes (Signed)
Occupational Therapy Treatment ?Patient Details ?Name: [name] ?MRN: [id number] ?DOB: [birth year] ?Today's Date: 09/05/2021 ? ? ?History of present illness Pt is a 71 y.o. female with medical history significant for severe aortic stenosis s/p TAVR, CAD, MI, hypertension, morbid obesity, obesity hypoventilation syndrome, diabetes mellitus, coronary artery disease, and obstructive sleep apnea who presents to the emergency room via EMS for evaluation of change in mental status. MD assesment includes UTI, septic shock due to E coli pyelonephritis with bacteremia, acute hypoxic respiratory failure, AKI, acute metabolic encephalopathy, and mild thrombocytopenia. ?  ?OT comments ? Upon entering the room, pt supine in bed and agreeable to skilled co-treatment. Pt is motivated to participate. Pt needing max A to EOB. She is able to maintain static sitting balance with min guard. Multiple attempts made to stand without pt being able to clear buttocks with max A of 2. Pt did begin to ask to return to supine and displayed increased anxiety once seated on EOB. Pt returning to supine and repositioned with +2 assist. She had BM during session but was unaware. NT called to assist pt further as therapist exited the room. Pt continues to benefit from OT intervention. Recommendation for short term rehab to address functional deficits at discharge remain appropriate for pt.   ? ?Recommendations for follow up therapy are one component of a multi-disciplinary discharge planning process, led by the attending physician.  Recommendations may be updated based on patient status, additional functional criteria and insurance authorization. ?   ?Follow Up Recommendations ? Skilled nursing-short term rehab (<3 hours/day)  ?  ?Assistance Recommended at Discharge Frequent or constant Supervision/Assistance  ?Patient can return home with the following ? Two people to help with walking and/or transfers;Two people to help with  bathing/dressing/bathroom;Assistance with cooking/housework;Help with stairs or ramp for entrance;Assist for transportation;Direct supervision/assist for medications management;Direct supervision/assist for financial management ?  ?Equipment Recommendations ? Other (comment) (defer to next venue of care)  ?  ?   ?Precautions / Restrictions Precautions ?Precautions: Fall ?Restrictions ?Weight Bearing Restrictions: No ?Other Position/Activity Restrictions: HOB does not need to be at 30 deg as long as pt can tolerate being lower per Dr. [place] Pink 09/05/21  ? ? ?  ? ?Mobility Bed Mobility ?Overal bed mobility: Needs Assistance ?Bed Mobility: Supine to Sit, Sit to Supine, Rolling ?Rolling: Max assist, +2 for physical assistance ?  ?Supine to sit: +2 for physical assistance, Mod assist ?Sit to supine: +2 for physical assistance, Mod assist ?  ?General bed mobility comments: +2 Mod A for BLE and trunk control with verbal cuing for sequencing with pt putting forth much improved effort this session ?  ? ?Transfers ?  ?  ?  ?  ?  ?  ?  ?  ?  ?General transfer comment: Pt unable to clear buttocks from bed even with +2 Max A and use of BRW ?  ?  ?Balance Overall balance assessment: Needs assistance ?Sitting-balance support: Feet unsupported, Single extremity supported ?Sitting balance-Leahy Scale: Fair ?  ?  ?  ?  ?  ?  ?  ?  ?  ?  ?  ?  ?  ?  ?  ?  ?   ? ?ADL either performed or assessed with clinical judgement  ? ? ?Extremity/Trunk Assessment Upper Extremity Assessment ?Upper Extremity Assessment: Generalized weakness ?  ?Lower Extremity Assessment ?Lower Extremity Assessment: Generalized weakness ?  ?  ?  ? ?Vision Patient Visual Report: No change from baseline ?  ?  ?   ?   ? ?  Cognition Arousal/Alertness: Awake/alert ?Behavior During Therapy: [facility] for tasks assessed/performed ?Overall Cognitive Status: No family/caregiver present to determine baseline cognitive functioning ?  ?  ?  ?  ?  ?  ?  ?  ?  ?  ?  ?  ?  ?  ?  ?  ?  ?   ?  ? ?   ?   ? ?Frequency ? Min 2X/week  ? ? ? ? ?  ?Progress Toward Goals ? ?OT Goals(current goals can now be found in the care plan section) ? Progress towards OT goals: Progressing toward goals ? ?Acute Rehab OT Goals ?Patient Stated Goal: to get better and stronger ?OT Goal Formulation: With patient ?Time For Goal Achievement: 09/16/21 ?Potential to Achieve Goals: Good  ?Plan Discharge plan remains appropriate;Frequency remains appropriate   ? ?Co-evaluation ? ? ?   ?Reason for Co-Treatment: Complexity of the patient's impairments (multi-system involvement);For patient/therapist safety;To address functional/ADL transfers ?PT goals addressed during session: Mobility/safety with mobility;Strengthening/ROM ?OT goals addressed during session: ADL's and self-care;Strengthening/ROM ?  ? ?  ?AM-PAC OT "6 Clicks" Daily Activity     ?Outcome Measure ? ? Help from another person eating meals?: None ?Help from another person taking care of personal grooming?: A Little ?Help from another person toileting, which includes using toliet, bedpan, or urinal?: Total ?Help from another person bathing (including washing, rinsing, drying)?: A Lot ?Help from another person to put on and taking off regular upper body clothing?: A Little ?Help from another person to put on and taking off regular lower body clothing?: Total ?6 Click Score: 14 ? ?  ?End of Session Equipment Utilized During Treatment: Rolling walker (2 wheels) ? ?OT Visit Diagnosis: Unsteadiness on feet (R26.81);Muscle weakness (generalized) (M62.81) ?  ?Activity Tolerance Patient tolerated treatment well;Patient limited by fatigue ?  ?Patient Left in bed;with bed alarm set;with call bell/phone within reach ?  ?Nurse Communication Mobility status ?  ? ?   ? ?Time: 4536-4680 ?OT Time Calculation (min): 22 min ? ?Charges: OT General Charges ?$OT Visit: 1 Visit ?OT Treatments ?$Therapeutic Activity: 8-22 mins ? ?Darleen Crocker, [place], OTR/L , [place] ?ascom [phone]   ?09/05/21, 4:09 PM  ?

## 2021-09-05 NOTE — TOC Progression Note (Addendum)
Transition of Care (TOC) - Progression Note  ? ? ?Patient Details  ?Name: [name] ?MRN: [id number] ?Date of Birth: 1950-08-12 ? ?Transition of Care (TOC) CM/SW Contact  ?Yudit Modesitt A Nhi Butrum, LCSW ?Phone Number: ?09/05/2021, 2:17 PM ? ?Clinical Narrative:   CSW provided pt's Cousin with only bed offer [name]). [facility] is still pending and pt's Cousin asking that CSW reach out to [facility] to determine if they can or can not take. CSW has reached out to [name] at [facility] and she is reviewing. CSW told pt's Cousin anticipated dc date is tomorrow. Pt's [name] is requesting a call from PT.  ? ?PT notified and call pt's Cousin. ? ? ? ?Expected Discharge Plan:  (TBD) ?Barriers to Discharge: Continued Medical Work up ? ?Expected Discharge Plan and Services ?Expected Discharge Plan:  (TBD) ?  ?Discharge Planning Services: CM Consult ?  ?Living arrangements for the past 2 months: Apartment ?                ?DME Arranged: N/A ?DME Agency: NA ?  ?  ?  ?HH Arranged: RN ?[place] Agency: [employer] ?Date HH Agency Contacted: 08/26/21 ?Time [place]: [phone] ?  ? ? ?Social Determinants of Health (SDOH) Interventions ?  ? ?Readmission Risk Interventions ?No flowsheet data found. ? ?

## 2021-09-05 NOTE — Progress Notes (Signed)
?   09/05/21 1000  ?Clinical Encounter Type  ?Visited With Patient;Health care provider  ?Visit Type Initial  ? ? ?

## 2021-09-05 NOTE — Progress Notes (Signed)
Physical Therapy Treatment ?Patient Details ?Name: [name] ?MRN: [id number] ?DOB: Jun 19, 1951 ?Today's Date: 09/05/2021 ? ? ?History of Present Illness Pt is a 71 y.o. female with medical history significant for severe aortic stenosis s/p TAVR, CAD, MI, hypertension, morbid obesity, obesity hypoventilation syndrome, diabetes mellitus, coronary artery disease, and obstructive sleep apnea who presents to the emergency room via EMS for evaluation of change in mental status. MD assesment includes UTI, septic shock due to E coli pyelonephritis with bacteremia, acute hypoxic respiratory failure, AKI, acute metabolic encephalopathy, and mild thrombocytopenia. ? ?  ?PT Comments  ? ? Pt was pleasant and put forth fair effort during the session with min encouragement.  Pt required +2 physical assistance with bed mobility tasks but grossly less assistance compared to the prior session.  Multiple attempts made to come to standing with extensive +2 assist with pt unable to clear her bottom from the mattress.  Pt will benefit from PT services in a SNF setting upon discharge to safely address deficits listed in patient problem list for decreased caregiver assistance and eventual return to PLOF. ? ?   ?Recommendations for follow up therapy are one component of a multi-disciplinary discharge planning process, led by the attending physician.  Recommendations may be updated based on patient status, additional functional criteria and insurance authorization. ? ?Follow Up Recommendations ? Skilled nursing-short term rehab (<3 hours/day) ?  ?  ?Assistance Recommended at Discharge Frequent or constant Supervision/Assistance  ?Patient can return home with the following Two people to help with walking and/or transfers;Two people to help with bathing/dressing/bathroom;Assistance with cooking/housework;Direct supervision/assist for medications management;Assist for transportation;Help with stairs or ramp for entrance ?  ?Equipment  Recommendations ? Other (comment) (TBD at next venue of care)  ?  ?Recommendations for Other Services   ? ? ?  ?Precautions / Restrictions Precautions ?Precautions: Fall ?Restrictions ?Weight Bearing Restrictions: No ?Other Position/Activity Restrictions: HOB does not need to be at 30 deg as long as pt can tolerate being lower per Dr. [place] Pink 09/05/21  ?  ? ?Mobility ? Bed Mobility ?Overal bed mobility: Needs Assistance ?Bed Mobility: Supine to Sit, Sit to Supine, Rolling ?Rolling: Max assist, +2 for physical assistance ?  ?Supine to sit: +2 for physical assistance, Mod assist ?Sit to supine: +2 for physical assistance, Mod assist ?  ?General bed mobility comments: +2 Mod A for BLE and trunk control with verbal cuing for sequencing with pt putting forth much improved effort this session ?  ? ?Transfers ?  ?  ?  ?  ?  ?  ?  ?  ?  ?General transfer comment: Pt unable to clear buttocks from bed even with +2 Max A and use of BRW ?  ? ?Ambulation/Gait ?  ?  ?  ?  ?  ?  ?  ?  ? ? ?Stairs ?  ?  ?  ?  ?  ? ? ?Wheelchair Mobility ?  ? ?Modified Rankin (Stroke Patients Only) ?  ? ? ?  ?Balance Overall balance assessment: Needs assistance ?Sitting-balance support: Feet unsupported, Single extremity supported ?Sitting balance-Leahy Scale: Fair ?  ?  ?  ?  ?  ?  ?  ?  ?  ?  ?  ?  ?  ?  ?  ?  ?  ? ?  ?Cognition Arousal/Alertness: Awake/alert ?Behavior During Therapy: [facility] for tasks assessed/performed ?Overall Cognitive Status: No family/caregiver present to determine baseline cognitive functioning ?  ?  ?  ?  ?  ?  ?  ?  ?  ?  ?  ?  ?  ?  ?  ?  ?  ?  ?  ? ?  ?  Exercises Total Joint Exercises ?Ankle Circles/Pumps: AROM, Strengthening, Both, 10 reps ?Quad Sets: Strengthening, Both, 10 reps ?Other Exercises ?Other Exercises: Multiple sit to/from stand transfer attempts from elevated EOB with max multi-modal cues for proper sequencing ?Other Exercises: Static sitting at EOB for improved activity tolerance and core strength ? ?   ?General Comments   ?  ?  ? ?Pertinent Vitals/Pain Pain Assessment ?Pain Assessment: No/denies pain  ? ? ?Home Living   ?  ?  ?  ?  ?  ?  ?  ?  ?  ?   ?  ?Prior Function    ?  ?  ?   ? ?PT Goals (current goals can now be found in the care plan section) Progress towards PT goals: Progressing toward goals ? ?  ?Frequency ? ? ? Min 2X/week ? ? ? ?  ?PT Plan Current plan remains appropriate  ? ? ?Co-evaluation PT/OT/SLP Co-Evaluation/Treatment: Yes ?Reason for Co-Treatment: Complexity of the patient's impairments (multi-system involvement);For patient/therapist safety;To address functional/ADL transfers ?PT goals addressed during session: Mobility/safety with mobility;Strengthening/ROM ?OT goals addressed during session: ADL's and self-care;Strengthening/ROM ?  ? ?  ?AM-PAC PT "6 Clicks" Mobility   ?Outcome Measure ? Help needed turning from your back to your side while in a flat bed without using bedrails?: A Lot ?Help needed moving from lying on your back to sitting on the side of a flat bed without using bedrails?: Total ?Help needed moving to and from a bed to a chair (including a wheelchair)?: Total ?Help needed standing up from a chair using your arms (e.g., wheelchair or bedside chair)?: Total ?Help needed to walk in hospital room?: Total ?Help needed climbing 3-5 steps with a railing? : Total ?6 Click Score: 7 ? ?  ?End of Session Equipment Utilized During Treatment: Gait belt ?Activity Tolerance: Patient tolerated treatment well ?Patient left: in bed;with call bell/phone within reach;with bed alarm set ?Nurse Communication: Mobility status ?PT Visit Diagnosis: Difficulty in walking, not elsewhere classified (R26.2);Muscle weakness (generalized) (M62.81) ?  ? ? ?Time: 0488-8916 ?PT Time Calculation (min) (ACUTE ONLY): 24 min ? ?Charges:  $Therapeutic Activity: 8-22 mins          ?          ? ?D. Royetta Asal PT, DPT ?09/05/21, 3:09 PM ? ? ?

## 2021-09-05 NOTE — NC FL2 (Signed)
?[place] MEDICAID FL2 LEVEL OF CARE SCREENING TOOL  ?  ? ?IDENTIFICATION  ?Patient Name: ?[name] Birthdate: [birth year] Sex: female Admission Date (Current Location): ?08/25/2021  ?[place] and [place] Number: ? [place] ?  Facility and Address:  ?[facility], [address], [place], [place] [address] ?     Provider Number: ?[id number]  ?Attending Physician Name and Address:  ?Annita Brod, MD ? Relative Name and Phone Number:  ?[name] Capps(COUSIN) [phone] ?   ?Current Level of Care: ?Hospital Recommended Level of Care: ?[place] Prior Approval Number: ?  ? ?Date Approved/Denied: ?  PASRR Number: ?[id number] A ? ?Discharge Plan: ?SNF ?  ? ?Current Diagnoses: ?Patient Active Problem List  ? Diagnosis Date Noted  ? Chronic systolic CHF (congestive heart failure) ([place]) 09/03/2021  ? Hypernatremia 09/03/2021  ? Obstructive uropathy 08/25/2021  ? CAD (coronary artery disease)   ? Diabetes mellitus without complication ([place])   ? Acute metabolic encephalopathy   ? Acute renal failure superimposed on stage 3b chronic kidney disease ([place])   ? Paroxysmal atrial fibrillation (HCC)   ? Right ureteral stone   ? Septic shock due to Escherichia coli ([place]) 11/02/2017  ? Wide-complex tachycardia 07/11/2017  ? Aortic stenosis 05/21/2017  ? Syncope, near 05/19/2017  ? Chronic diastolic heart failure ([place]) 07/27/2016  ? Hypertension 07/27/2016  ? Obstructive sleep apnea 07/27/2016  ? Cellulitis 07/27/2016  ? Pressure injury of skin 05/02/2016  ? Lactic acidosis   ? Severe aortic stenosis   ? OSA on CPAP   ? Morbid obesity ([place])   ? Community acquired pneumonia   ? ? ?Orientation RESPIRATION BLADDER Height & Weight   ?  ?Self, Time, Situation, Place ? O2 Continent Weight: (!) 317 lb 0.3 oz (143.8 kg) ?Height:  _0  (165.1 cm)  ?BEHAVIORAL SYMPTOMS/MOOD NEUROLOGICAL BOWEL NUTRITION STATUS  ?    Continent Diet (Dysphagia 1 ([place]ure[place]);Nectar-thick liquid)  ?AMBULATORY STATUS  COMMUNICATION OF NEEDS Skin   ?Extensive Assist Verbally Normal ?  ?  ?  ?    ?     ?     ? ? ?Personal Care Assistance Level of Assistance  ?Total care   ?Feeding assistance: Limited assistance ?  ?Total Care Assistance: Maximum assistance  ? ?Functional Limitations Info  ?Sight Sight Info: Impaired ?  ?   ? ? ?SPECIAL CARE FACTORS FREQUENCY  ?PT (By licensed PT), OT (By licensed OT) (Hemodialysis)   ?  ?PT Frequency: 5 times per week ?OT Frequency: 5 times per week ?  ?  ?  ?   ? ? ?Contractures    ? ? ?Additional Factors Info  ?Allergies   ?Allergies Info: Tetanus Toxoid Adsorbed, Tetanus Toxoid, Tetanus Toxoids,  Lisinopril, Niacin, Sitagliptin, Sulfamethoxazole-trimethoprim ?  ?  ?  ?   ? ?Current Medications (09/05/2021):  This is the current hospital active medication list ?Current Facility-Administered Medications  ?Medication Dose Route Frequency Provider Last Rate Last Admin  ? 0.45 % sodium chloride infusion   Intravenous Continuous Annita Brod, MD 75 mL/hr at 09/05/21 1245 New Bag at 09/05/21 1245  ? 0.9 %  sodium chloride infusion   Intravenous PRN Annita Brod, MD 10 mL/hr at 09/01/21 1900 Infusion Verify at 09/01/21 1900  ? acetaminophen (TYLENOL) tablet 650 mg  650 mg Oral Q6H PRN Annita Brod, MD   650 mg at 08/26/21 1616  ? apixaban (ELIQUIS) tablet 5 mg  5 mg Oral BID Annita Brod, MD  5 mg at 09/05/21 1009  ? chlorhexidine gluconate (MEDLINE KIT) (PERIDEX) 0.12 % solution 15 mL  15 mL Mouth Rinse BID Annita Brod, MD   15 mL at 09/05/21 0906  ? Chlorhexidine Gluconate Cloth 2 % PADS 6 each  6 each Topical Q0600 Annita Brod, MD   6 each at 09/05/21 [phone]  ? docusate sodium (COLACE) capsule 100 mg  100 mg Oral BID Annita Brod, MD   100 mg at 09/05/21 1009  ? feeding supplement (NEPRO CARB STEADY) liquid 237 mL  237 mL Oral TID BM Annita Brod, MD   237 mL at 09/04/21 1405  ? heparin sodium (porcine) injection 2,800 Units  2,800 Units Intracatheter  PRN Annita Brod, MD   2,800 Units at 09/01/21 1024  ? insulin aspart (novoLOG) injection 0-20 Units  0-20 Units Subcutaneous Q4H Annita Brod, MD   4 Units at 09/05/21 1244  ? insulin glargine-yfgn (SEMGLEE) injection 20 Units  20 Units Subcutaneous Daily Annita Brod, MD   20 Units at 09/04/21 1648  ? ipratropium-albuterol (DUONEB) 0.5-2.5 (3) MG/3ML nebulizer solution 3 mL  3 mL Nebulization Q4H PRN Annita Brod, MD      ? MEDLINE mouth rinse  15 mL Mouth Rinse q12n4p Annita Brod, MD   15 mL at 09/05/21 1010  ? multivitamin with minerals tablet 1 tablet  1 tablet Oral Daily Annita Brod, MD   1 tablet at 09/05/21 1010  ? ondansetron (ZOFRAN) tablet 4 mg  4 mg Oral Q6H PRN Annita Brod, MD      ? Or  ? ondansetron (ZOFRAN) injection 4 mg  4 mg Intravenous Q6H PRN Annita Brod, MD      ? pantoprazole (PROTONIX) EC tablet 40 mg  40 mg Oral Daily Annita Brod, MD   40 mg at 09/05/21 1009  ? polyethylene glycol (MIRALAX / GLYCOLAX) packet 17 g  17 g Oral Daily Oswald Hillock, RPH   17 g at 09/05/21 1011  ? sodium chloride flush (NS) 0.9 % injection 10-40 mL  10-40 mL Intracatheter Q12H Annita Brod, MD   10 mL at 09/05/21 1011  ? sodium chloride flush (NS) 0.9 % injection 10-40 mL  10-40 mL Intracatheter PRN Annita Brod, MD   10 mL at 09/02/21 0848  ? torsemide (DEMADEX) tablet 40 mg  40 mg Oral Daily Annita Brod, MD   40 mg at 09/05/21 1009  ? ? ? ?Discharge Medications: ?Please see discharge summary for a list of discharge medications. ? ?Relevant Imaging Results: ? ?Relevant Lab Results: ? ? ?Additional Information ?SSN: [id number] ? ?[name] A Skylie Hiott, LCSW ? ? ? ? ?

## 2021-09-05 NOTE — Consult Note (Signed)
PHARMACY CONSULT NOTE - FOLLOW UP ? ?Pharmacy Consult for Electrolyte Monitoring and Replacement  ? ?Recent Labs: ?Potassium (mmol/L)  ?Date Value  ?09/05/2021 3.7  ? ?Magnesium (mg/dL)  ?Date Value  ?09/05/2021 2.0  ? ?Calcium (mg/dL)  ?Date Value  ?09/05/2021 8.7 (L)  ? ?Albumin (g/dL)  ?Date Value  ?09/05/2021 2.6 (L)  ? ?Phosphorus (mg/dL)  ?Date Value  ?09/05/2021 2.9  ? ?Sodium (mmol/L)  ?Date Value  ?09/05/2021 148 (H)  ? ? ? ?Assessment: ?71 yo F w/ h/o HFpEF, Severe AS, CAD (AC-paced), HTN, BMI 57, DM, severe urosepsis with septic shock ISO Rt prox ureteral stone w/ hydronephrosis & large Lt renal stone c/b AKI, acute metabolic encephalopathy. S/p cytoscopy B/L ureteral stents on 3/02. Pharmacy consulted for the mgmt of electrolytes. On torsemide 40 mg daily.  ? ?Goal of Therapy:  ?[place] WNL ? ?Plan:  ?Current electrolytes are stable WNL. Will CTM and replace as needed. ?F/u with AM labs.  ? ?Darnelle Bos, PharmD ?Clinical Pharmacist ?09/05/2021 7:42 AM ? ? ?

## 2021-09-05 NOTE — Progress Notes (Signed)
HD Catheter removed, pt tolerated well. There is a stitch that is too deep into pt's skin that could not be removed. MD Maryland Pink made aware. No new orders received.  ?

## 2021-09-05 NOTE — Progress Notes (Signed)
[facility] ?[place], [place] ?09/05/21 ? ?Subjective:  ? ?Hospital day # 11 ? ?Patient seen sitting in bed with personal aid at bedside ?Tolerating meals without nausea vomiting ?Denies shortness of breath, remains on room air ? ?Creatinine continues to improve with adequate urine output ?Lower extremity edema remains ? ?03/12 0701 - 03/13 0700 ?In: 600 [P.O.:600] ?Out: 1575 [UUEKC:0034] ?Lab Results  ?Component Value Date  ? CREATININE 1.83 (H) 09/05/2021  ? CREATININE 1.89 (H) 09/04/2021  ? CREATININE 1.90 (H) 09/03/2021  ? ? ?Objective:  ?Vital signs in last 24 hours:  ?Temp:  [97.7 ?F (36.5 ?C)-98.6 ?F (37 ?C)] 98.1 ?F (36.7 ?C) (03/13 1105) ?Pulse Rate:  [58-62] 60 (03/13 1105) ?Resp:  [18-22] 19 (03/13 1105) ?BP: (127-136)/(54-83) 130/57 (03/13 1105) ?SpO2:  [92 %-99 %] 95 % (03/13 1105) ? ?Weight change:  ?Filed Weights  ? 09/02/21 0500 09/03/21 0500 09/04/21 0450  ?Weight: (!) 144.5 kg (!) 145.8 kg (!) 143.8 kg  ? ? ?Intake/Output: ?  ? ?Intake/Output Summary (Last 24 hours) at 09/05/2021 1151 ?Last data filed at 09/05/2021 1011 ?Gross per 24 hour  ?Intake 720 ml  ?Output 1025 ml  ?Net -305 ml  ? ? ? ? ?Physical Exam: ?General No acute distress  ?HEENT [place] O2  ?Pulm/lungs Bilateral rales, normal effort  ?CVS/Heart Irregular, paced rhythm  ?Abdomen:  Soft NTND  ?Extremities: 1+ lower extremity edema  ?Neurologic: Alert, oriented, moving all 4 extremities  ?Access: Left IJ temporary dialysis catheter  ?GU: Foley in place   ? ? ?Basic Metabolic Panel: ? ?Recent Labs  ?Lab 09/01/21 ?0444 09/02/21 ?0455 09/03/21 ?9179 09/04/21 ?0413 09/05/21 ?0416  ?NA 142 145 147* 146* 148*  ?K 3.7 3.8 3.6 3.5 3.7  ?CL 106 109 110 109 107  ?CO2 26 29 28 27 30   ?GLUCOSE 154* 118* 111* 167* 124*  ?BUN 62* 60* 61* 61* 57*  ?CREATININE 2.16* 1.98* 1.90* 1.89* 1.83*  ?CALCIUM 7.8* 7.9* 8.1* 8.3* 8.7*  ?MG 2.3 2.4 2.1 2.2 2.0  ?PHOS 3.7 3.7 3.9 3.0 2.9  ? ? ? ? ?CBC: ?Recent Labs  ?Lab 08/31/21 ?0446 09/01/21 ?0444  09/02/21 ?0455 09/03/21 ?1505 09/04/21 ?0413  ?WBC 16.0* 14.8* 13.0* 13.3* 11.0*  ?HGB 11.1* 10.7* 10.4* 10.5* 10.8*  ?HCT 35.0* 34.6* 33.8* 34.7* 35.2*  ?MCV 91.4 93.3 94.2 97.2 93.6  ?PLT 91* 95* 104* 117* 122*  ? ? ? ?  ?Lab Results  ?Component Value Date  ? HEPBSAG NON REACTIVE 08/31/2021  ? HEPBSAB NON REACTIVE 08/31/2021  ? ? ? ? ?Microbiology: ? ?No results found for this or any previous visit (from the past 240 hour(s)). ? ? ?Coagulation Studies: ?No results for input(s): LABPROT, INR in the last 72 hours. ? ? ?Urinalysis: ?No results for input(s): COLORURINE, LABSPEC, [place], GLUCOSEU, HGBUR, BILIRUBINUR, KETONESUR, PROTEINUR, UROBILINOGEN, NITRITE, LEUKOCYTESUR in the last 72 hours. ? ?Invalid input(s): APPERANCEUR  ? ? ?Imaging: ?No results found. ? ? ?Medications:  ? ? sodium chloride    ? sodium chloride 10 mL/hr at 09/01/21 1900  ? ? apixaban  5 mg Oral BID  ? chlorhexidine gluconate (MEDLINE KIT)  15 mL Mouth Rinse BID  ? Chlorhexidine Gluconate Cloth  6 each Topical Q0600  ? docusate sodium  100 mg Oral BID  ? feeding supplement (NEPRO CARB STEADY)  237 mL Oral TID BM  ? insulin aspart  0-20 Units Subcutaneous Q4H  ? insulin glargine-yfgn  20 Units Subcutaneous Daily  ? mouth rinse  15 mL  Mouth Rinse q12n4p  ? multivitamin with minerals  1 tablet Oral Daily  ? pantoprazole  40 mg Oral Daily  ? polyethylene glycol  17 g Oral Daily  ? sodium chloride flush  10-40 mL Intracatheter Q12H  ? torsemide  40 mg Oral Daily  ? ?sodium chloride, acetaminophen, heparin sodium (porcine), ipratropium-albuterol, ondansetron **OR** ondansetron (ZOFRAN) IV, sodium chloride flush ? ?Assessment/ Plan:  ?71 y.o. female with  morbid obesity (BMI 57), DM, CAD, OSA, HTN, Kidney stones, aortic stenosis with h/o TAVR   admitted on 08/25/2021 for ?Renal colic [S82] ?Sepsis ([place]) [A41.9] ?Urinary tract infection with hematuria, site unspecified [N39.0, R31.9] ?Community acquired pneumonia, unspecified laterality [J18.9] ?Sepsis,  due to unspecified organism, unspecified whether acute organ dysfunction present ([place]) [A41.9] ? ? 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  Admit creatinine of 2.31 that peaked at 4.47.  CRRT discontinued 08/29/2021.  UF only treatment on 09/01/2021 net UF 1.5 L. ? ?03/12 0701 - 03/13 0700 ?In: 600 [P.O.:600] ?Out: 1575 [LMBEM:7544]  ?  ?-Renal function continues to recover.  Urine output 1.5 L recorded in past 24 hours. ?- We feel dialysis is no longer required.  We will place order to DC HD temp cath. ?- Patient will need to follow-up with our office at discharge for continued monitoring ?  ? ?#Acute respiratory failure ?Extubated on March 6 ?Remains on room air ? ?# Sepsis with UTI, hypotension, shock ?E Coli Pyelonephritis and bacteremia ?Completed antibiotic course ? ?# Nephrolithiasis with rt proximal stone with hydronephrosis and left UPJ stone without hydronephrosis. On 3/2, patient underwent cystoscopy with bilateral ureteral stents placed by Dr. Diamantina Providence. Patient was found to have purulent urine in bladder. Plan for outpatient b/l uretereroscopy/laser lithotripsy in 1-2 months ?Small stone retrieved by nursing.  Sent for analysis.   ? ? ? LOS: 11 ?[place] ?3/13/202311:51 AM ? ?[facility] ?[place], [place] ?[phone] ? ?

## 2021-09-05 NOTE — Progress Notes (Signed)
[facility] Progress Note ? ?Patient: [name]    NFA:[id number]  DOA: 08/25/2021    ?Date of Service: the patient was seen and examined on 09/05/2021 ? ?Brief hospital course: ?Patient is a 71 year old female with past medical history of morbid obesity, obstructive sleep apnea, diabetes mellitus, severe aortic stenosis status post TAVR and systolic heart failure with an ejection fraction of 45-50% admitted on 3/2 for UTI with septic shock in the setting of bilateral ureteral stones causing obstruction.  Patient was taken for urgent cystoscopy with bilateral stent placement and came back to the ICU postprocedure intubated.  Urology plans to do outpatient bilateral ureteroscopy with laser lithotripsy in 1 to 2 months. ? ?Septic shock and urinary obstruction caused the patient's stage IIIb chronic kidney disease to worsen.  Nephrology consulted and patient started on CRRT which was discontinued on 3/6.  Ultrafiltration only treatment on 3/9 with IV albumin given.  Patient able to be weaned off of ventilator and pressor support.  Blood and urine cultures positive for pansensitive E. coli.  By 3/10, patient's mentation slightly improving and she was transferred to the hospitalist service. ?Patient's renal function continues to improve and nephrology able to stop hemodialysis. ? ? ?Assessment and Plan: ?Assessment and Plan: ?* Septic shock due to Escherichia coli [facility]) ?Patient met criteria for septic shock on admission given lactic acid level of 4.3, urinary tract infection source, cultures that grew out E. coli as well as on refractory hypotension requiring pressor support, tachycardia and tachypnea.  Sepsis now stabilized.  Patient weaned off of pressor support and completed 10 days of antibiotics on 3/11. ? ?Obstructive uropathy ?Patient noted to have obstructive uropathy with 2 6 mm stones in the proximal right ureter resulting in right-sided hydronephrosis as well as left-sided hydronephrosis.   Appreciate urology help with status post bilateral stent placement.  Plan is for follow-up cystoscopy to remove stents and laser lithotripsy in 2 to 3 weeks. ? ?Acute renal failure superimposed on stage 3b chronic kidney disease ([place]) ?Most likely secondary to ATN from hypotension.  Initially on CRRT and then ultrafiltration.  Nephrology following.  Urine output increasing.  Creatinine today at at 1.89 with GFR of 28, very close to baseline.  Nephrology feels HD no longer needed and dialysis catheter to be removed today. ? ?Hypernatremia ?Likely from poor p.o. intake from restricted diet and patient's poor mentation.  Should see some improvement now that she is much more alert and hopefully her diet can be downgraded.  That said, sodium at 148 today so we will start gentle half-normal saline. ? ?Acute metabolic encephalopathy ?Secondary to septic shock and uremia.  Resolved.  Patient today is quite oriented and appropriate, able to interact. ? ?Chronic systolic CHF (congestive heart failure) ([place]) ?Ejection fraction 45 to 50%.  BNP minimally elevated at 241 in the context of her stage IV chronic kidney disease so would consider her euvolemic or little dry at this point. ? ?Paroxysmal atrial fibrillation ([place]) ?History of paroxysmal atrial fibrillation.  Currently in atrial fibrillation.  Heart rate currently well controlled.  On heparin infusion and when she is more awake, will restart Eliquis if renal function improves.  Her home amiodarone is also on hold although although reportedly, she has not been taking this medication. ? ?Diabetes mellitus without complication ([place]) ?A1c of 7.6.  Currently on Lantus and sliding scale.  CBGs in the 120s to 170s. ? ?Obstructive sleep apnea ?Patient most likely has underlying obstructive sleep apnea related to morbid obesity  as well as possible obesity hypoventilation syndrome. ?Her pulse oximetry drops in the low 80s while asleep.  We will see about starting nightly  CPAP. ? ?Hypertension ?Now that she is weaned off of pressor support, blood pressure starting to trend back upwards.  We will slowly restart her home medications.  Currently just on torsemide.  ? ?Acute respiratory failure with hypoxia and hypercapnia (HCC)-resolved as of 09/03/2021 ?Secondary to septic shock.  Initially requiring ventilator support, now resolved and off of vent.  Currently on 2L nasal cannula. ? ?Morbid obesity ([place]) ?Meets criteria with BMI greater than 50 ?Complicates overall prognosis and care ? ?Severe aortic stenosis ?Status post TAVR ? ?CAD (coronary artery disease) ?Stable ?Continue statins ?Hold metoprolol due to hypotension ? ? ? ? ? ? ?Body mass index is 52.76 kg/m?Marland Kitchen  ?Nutrition Problem: Inadequate oral intake ?Etiology: inability to eat (pt sedated and ventilated) ?Pressure Injury 05/02/16 Stage I -  Intact skin with non-blanchable redness of a localized area usually over a bony prominence. (Active)  ?05/02/16 1400  ?Location: Buttocks  ?Location Orientation: Medial  ?Staging: Stage I -  Intact skin with non-blanchable redness of a localized area usually over a bony prominence.  ?Wound Description (Comments):   ?Present on Admission: Yes  ?  ? ?Consultants: ?Critical care ?Nephrology ?Urology ? ?Procedures: ?Ventilator from 3/2-3/9 ? ?Antimicrobials: ?IV cefepime 3/2 x 1 dose ?IV Rocephin 3/2 - 3/11 ? ?Code Status: Full code ? ? ?Subjective: No complaints although she would like to be off of restricted thickened liquids ?Objective: ?Noted borderline bradycardia ?Vitals:  ? 09/05/21 1105 09/05/21 1209  ?BP: (!) 130/57 (!) 148/74  ?Pulse: 60 (!) 59  ?Resp: 19 (!) 22  ?Temp: 98.1 ?F (36.7 ?C) 98.3 ?F (36.8 ?C)  ?SpO2: 95% 93%  ? ? ?Intake/Output Summary (Last 24 hours) at 09/05/2021 1428 ?Last data filed at 09/05/2021 1421 ?Gross per 24 hour  ?Intake 600 ml  ?Output 1300 ml  ?Net -700 ml  ? ? ?Filed Weights  ? 09/02/21 0500 09/03/21 0500 09/04/21 0450  ?Weight: (!) 144.5 kg (!) 145.8 kg (!)  143.8 kg  ? ?Body mass index is 52.76 kg/m?. ? ?Exam: ? ?General: Oriented x2, alert, no acute distress ?HEENT: Normocephalic, atraumatic, narrow airway ?Cardiovascular: irregular rhythm, borderline bradycardia ?Respiratory: Decreased breath sounds throughout secondary to body habitus ?Abdomen: Soft, obese, nontender, hypoactive bowel sounds ?Musculoskeletal: No clubbing or cyanosis, 1+ pitting edema ?Skin: No skin breaks, tears or lesions ?Psychiatry: Some delirium although slowly improving ?Neurology: No overt focal deficits although currently limited due to patient's delirium ? ?Data Reviewed: ?Sodium at 148 ? ?Disposition:  ?Status is: Inpatient ?Remains inpatient appropriate because: Removal of dialysis catheter.  Working with PT.  Anticipate discharge in the next 1 to 2 days to skilled nursing ?  ? ?Family Communication: Updated cousin who is power of attorney by phone ? ?apixaban (ELIQUIS) tablet 5 mg  ? ? ?Author: ?Annita Brod ,MD ?09/05/2021 2:28 PM ? ?To reach On-call, see care teams to locate the attending and reach out via [email or web address]. ?Between 7PM-7AM, please contact night-coverage ?If you still have difficulty reaching the attending provider, please page the [facility] (Director on Call) for [facility] on amion for assistance. ? ?

## 2021-09-06 ENCOUNTER — Telehealth: Payer: Self-pay | Admitting: Urology

## 2021-09-06 DIAGNOSIS — R1310 Dysphagia, unspecified: Secondary | ICD-10-CM

## 2021-09-06 DIAGNOSIS — I480 Paroxysmal atrial fibrillation: Secondary | ICD-10-CM

## 2021-09-06 LAB — RENAL FUNCTION PANEL
Albumin: 2.8 g/dL — ABNORMAL LOW (ref 3.5–5.0)
Anion gap: 9 (ref 5–15)
BUN: 53 mg/dL — ABNORMAL HIGH (ref 8–23)
CO2: 30 mmol/L (ref 22–32)
Calcium: 8.5 mg/dL — ABNORMAL LOW (ref 8.9–10.3)
Chloride: 106 mmol/L (ref 98–111)
Creatinine, Ser: 1.68 mg/dL — ABNORMAL HIGH (ref 0.44–1.00)
GFR, Estimated: 33 mL/min — ABNORMAL LOW (ref >60)
Glucose, Bld: 103 mg/dL — ABNORMAL HIGH (ref 70–99)
Phosphorus: 3.3 mg/dL (ref 2.5–4.6)
Potassium: 3.6 mmol/L (ref 3.5–5.1)
Sodium: 145 mmol/L (ref 135–145)

## 2021-09-06 LAB — GLUCOSE, CAPILLARY
Glucose-Capillary: 99 mg/dL (ref 70–99)
Glucose-Capillary: 120 mg/dL — ABNORMAL HIGH (ref 70–99)
Glucose-Capillary: 182 mg/dL — ABNORMAL HIGH (ref 70–99)
Glucose-Capillary: 165 mg/dL — ABNORMAL HIGH (ref 70–99)

## 2021-09-06 LAB — MAGNESIUM: Magnesium: 1.9 mg/dL (ref 1.7–2.4)

## 2021-09-06 MED ORDER — DOCUSATE SODIUM 100 MG PO CAPS: 100.0000 mg | ORAL_CAPSULE | Freq: Every day | ORAL | 0 refills | Status: DC

## 2021-09-06 MED ORDER — NEPRO/CARBSTEADY PO LIQD: 237.0000 mL | Freq: Three times a day (TID) | ORAL | 0 refills | Status: DC

## 2021-09-06 MED ORDER — APIXABAN 5 MG PO TABS: 5.0000 mg | ORAL_TABLET | Freq: Two times a day (BID) | ORAL | 1 refills | Status: AC

## 2021-09-06 MED ORDER — ATORVASTATIN CALCIUM 40 MG PO TABS: 40.0000 mg | ORAL_TABLET | Freq: Every day | ORAL | 0 refills | Status: AC

## 2021-09-06 MED ORDER — CHOLECALCIFEROL 50 MCG (2000 UT) PO CAPS: 1.0000 | ORAL_CAPSULE | Freq: Every day | ORAL | 1 refills | Status: AC

## 2021-09-06 MED ORDER — TORSEMIDE 40 MG PO TABS: 20.0000 mg | ORAL_TABLET | Freq: Every day | ORAL | 1 refills | Status: DC

## 2021-09-06 MED ORDER — ADULT MULTIVITAMIN W/MINERALS CH: 1.0000 | ORAL_TABLET | Freq: Every day | ORAL | Status: DC

## 2021-09-06 MED ORDER — CHLORHEXIDINE GLUCONATE CLOTH 2 % EX PADS
6.0000 | MEDICATED_PAD | Freq: Every day | CUTANEOUS | Status: DC
  Administered 2021-09-06: 6 via TOPICAL

## 2021-09-06 MED ORDER — POLYETHYLENE GLYCOL 3350 17 G PO PACK: 17.0000 g | PACK | Freq: Every day | ORAL | 0 refills | Status: DC | PRN

## 2021-09-06 MED ORDER — FENOFIBRATE 145 MG PO TABS: 145.0000 mg | ORAL_TABLET | Freq: Every day | ORAL | 1 refills | Status: AC

## 2021-09-06 MED ORDER — INSULIN GLARGINE-YFGN 100 UNIT/ML ~~LOC~~ SOLN: 20.0000 [IU] | Freq: Every day | SUBCUTANEOUS | 11 refills | Status: DC

## 2021-09-06 MED ORDER — INSULIN DETEMIR 100 UNIT/ML FLEXPEN: 30.0000 [IU] | PEN_INJECTOR | Freq: Every day | SUBCUTANEOUS | 11 refills | Status: DC

## 2021-09-06 NOTE — TOC Progression Note (Signed)
Transition of Care (TOC) - Progression Note  ? ? ?Patient Details  ?Name: [name] ?MRN: [id number] ?Date of Birth: 09/21/1950 ? ?Transition of Care (TOC) CM/SW Contact  ?Anabelen Kaminsky A Antonya Leeder, LCSW ?Phone Number: ?09/06/2021, 10:10 AM ? ?Clinical Narrative:   CSW spoke with [facility] and they can make a bed offer. CSW spoke with pt's cousin and she is accepting the bed offer at [facility]. Pt's cousin is aware pt may dc today and is agreeable. [facility] is aware pt is potentially dc today as well. MD notified. Will need updated Covid test. ? ? ? ?Expected Discharge Plan:  (TBD) ?Barriers to Discharge: Continued Medical Work up ? ?Expected Discharge Plan and Services ?Expected Discharge Plan:  (TBD) ?  ?Discharge Planning Services: CM Consult ?  ?Living arrangements for the past 2 months: Apartment ?                ?DME Arranged: N/A ?DME Agency: NA ?  ?  ?  ?HH Arranged: RN ?[place] Agency: [employer] ?Date HH Agency Contacted: 08/26/21 ?Time [place]: 9539 ?  ? ? ?Social Determinants of Health (SDOH) Interventions ?  ? ?Readmission Risk Interventions ?No flowsheet data found. ? ?

## 2021-09-06 NOTE — Care Management Important Message (Signed)
Important Message ? ?Patient Details  ?Name: [name] ?MRN: [id number] ?Date of Birth: 05-28-1951 ? ? ?Medicare Important Message Given:  Yes ? ?I have reviewed the Important Message from Medicare with [name], cousin [phone])  that accepted the bed offer at [facility] on behalf of the patient. I asked if she would like a copy and she replied yes. I have sent a copy via secure e-mail to: [email or web address] as requested. I thanked her for her time. ? ? ?Juliann Pulse A Lulubelle Simcoe ?09/06/2021, 11:25 AM ?

## 2021-09-06 NOTE — TOC Transition Note (Signed)
Transition of Care (TOC) - CM/SW Discharge Note ? ? ?Patient Details  ?Name: [name] ?MRN: [id number] ?Date of Birth: 26-Feb-1951 ? ?Transition of Care (TOC) CM/SW Contact:  ?Haru Anspaugh A [place] Doster, LCSW ?Phone Number: ?09/06/2021, 2:30 PM ? ? ?Clinical Narrative:   Clinical Social Worker facilitated patient discharge including contacting patient family and facility to confirm patient discharge plans.  Clinical information faxed to facility and family agreeable with plan.  CSW arranged ambulance transport via [facility] to [facility] room 807.  RN to call [phone] for report prior to discharge. ? ? ? ? ? ? ?Final next level of care: [place] ?Barriers to Discharge: No Barriers Identified ? ? ?Patient Goals and CMS Choice ?Patient states their goals for this hospitalization and ongoing recovery are:: Patient unable to state, intubated and sedated ?  ?  ? ?Discharge Placement ?  ?           ?Patient chooses bed at:  ([facility]) ?Patient to be transferred to facility by: [facility] ?Name of family member notified: Cousin at bedside aware ?Patient and family notified of of transfer: 09/06/21 ? ?Discharge Plan and Services ?  ?Discharge Planning Services: CM Consult ?           ?DME Arranged: N/A ?DME Agency: NA ?  ?  ?  ?HH Arranged: RN ?[place] Agency: [employer] ?Date HH Agency Contacted: 08/26/21 ?Time [place]: 8115 ?  ? ?Social Determinants of Health (SDOH) Interventions ?  ? ? ?Readmission Risk Interventions ?No flowsheet data found. ? ? ? ? ?

## 2021-09-06 NOTE — Telephone Encounter (Signed)
[name] has been discharged from the hospital and she will need to be scheduled for bilateral ureteroscopy and laser lithotripsy and stent exchange with Dr. Diamantina Providence.  She was very ill at the time of the procedure, so she will need an office visit with Dr. Diamantina Providence to discuss this procedure and pre-op labs.  ?

## 2021-09-06 NOTE — Discharge Summary (Addendum)
Physician Discharge Summary   Patient: [name] MRN: [id number] DOB: Nov 07, 1950  Admit date:     08/25/2021  Discharge date: 09/06/21  Discharge Physician: Hollice Espy   PCP: Oswaldo Conroy, MD   Recommendations at discharge:   Medication change: NovoLog 30 units subcu daily discontinued.  This medication may be restarted although should be more applicable to be given 3 times a day before meals.  See below. Medication change: Lantus decreased from 70 units down to 30 units daily and using Levemir which is covered by patient's insurance.  As patient's diet is upgraded, and CBGs trend upward, physician can increase Levemir accordingly. Medication change: K. Dur, metoprolol and Lasix discontinued. New medication: Demadex 20 mg p.o. daily Patient being discharged to skilled nursing facility. Speech therapy will follow patient at skilled nursing to see if her diet can be upgraded.  Currently patient on dysphagia 1 diet with nectar thick liquids. New medication: MiraLAX 17 g p.o. daily as needed Patient will follow-up with Urology in 2-3 weeks to have repeat cystoscopy with stent placements & if needed, laser lithotripsy Wound care to follow patient for chronic leg wound at skilled nursing  Discharge Diagnoses: Principal Problem:   Septic shock due to Escherichia coli [facility]) Active Problems:   Acute renal failure superimposed on stage 3b chronic kidney disease (HCC)   Obstructive uropathy   Acute metabolic encephalopathy   Hypernatremia   Chronic systolic CHF (congestive heart failure) (HCC)   Obstructive sleep apnea   Diabetes mellitus without complication (HCC)   Paroxysmal atrial fibrillation (HCC)   Hypertension   Morbid obesity (HCC)   Severe aortic stenosis   CAD (coronary artery disease)   Dysphagia  Resolved Problems:   Acute respiratory failure with hypoxia and hypercapnia [facility])  Hospital Course: Patient is a 71 year old female with past medical history  of morbid obesity, obstructive sleep apnea, diabetes mellitus, severe aortic stenosis status post TAVR and systolic heart failure with an ejection fraction of 45-50% admitted on 3/2 for UTI with septic shock in the setting of bilateral ureteral stones causing obstruction.  Patient was taken for urgent cystoscopy with bilateral stent placement and came back to the ICU postprocedure intubated.  Urology plans to do outpatient bilateral ureteroscopy with laser lithotripsy in 1 to 2 months.  Septic shock and urinary obstruction caused the patient's stage IIIb chronic kidney disease to worsen.  Nephrology consulted and patient started on CRRT which was discontinued on 3/6.  Ultrafiltration only treatment on 3/9 with IV albumin given.  Patient able to be weaned off of ventilator and pressor support.  Blood and urine cultures positive for pansensitive E. coli.  By 3/10, patient's mentation slightly improving and she was transferred to the hospitalist service. Patient's renal function continues to improve and nephrology able to stop hemodialysis.  Seen by physical therapy who recommended skilled nursing.  Patient felt to be stable and had bed available on 3/14.  Assessment and Plan: * Septic shock due to Escherichia coli [facility]) Patient met criteria for septic shock on admission given lactic acid level of 4.3, urinary tract infection source, cultures that grew out E. coli as well as on refractory hypotension requiring pressor support, tachycardia and tachypnea.  Sepsis now stabilized.  Patient weaned off of pressor support and completed 10 days of antibiotics on 3/11.  Obstructive uropathy Patient noted to have obstructive uropathy with 2 6 mm stones in the proximal right ureter resulting in right-sided hydronephrosis as well as left-sided hydronephrosis.  Appreciate  urology help with status post bilateral stent placement.  Plan is for follow-up cystoscopy to remove stents and laser lithotripsy in 2 to 3  weeks.  Acute renal failure superimposed on stage 3b chronic kidney disease (HCC) Most likely secondary to ATN from hypotension.  Initially on CRRT and then ultrafiltration.  Nephrology following.  Urine output increasing.  Creatinine today at at 1.68 with GFR of 33, her baseline.  Hemodialysis no longer needed as per nephrology and dialysis catheter removed 3/13.  Hypernatremia Likely from poor p.o. intake from restricted diet and patient's poor mentation.  Should see some improvement now that she is much more alert and hopefully her diet can be upgraded.  Sodium at 145 today after receiving low-dose of half-normal saline.  Acute metabolic encephalopathy Secondary to septic shock and uremia.  Resolved.  Patient today is quite oriented and appropriate, able to interact.  Chronic systolic CHF (congestive heart failure) (HCC) Ejection fraction 45 to 50%.  Euvolemic.  Given systolic heart failure, discontinuing her metformin for risk of future lactic acidosis.  Paroxysmal atrial fibrillation (HCC) History of paroxysmal atrial fibrillation.  Currently in atrial fibrillation.  Heart rate currently well controlled.  On heparin infusion and when she is more awake, will restart Eliquis if renal function improves.  Her home amiodarone is also on hold although although reportedly, she has not been taking this medication.  Diabetes mellitus without complication (HCC) A1c of 7.6.  Currently on Lantus and sliding scale.  CBGs in the 120s to 170s.  Please note that at home, patient was on 70 units of Lantus daily plus NovoLog 30 units daily and in the hospital, her blood sugars have been controlled 20 units of Lantus daily plus sliding scale.  This is likely in part due to the fact that patient is on a restricted diet with nectar thick liquids.  Imagine that as she is followed by speech therapy at skilled nursing and her diet is upgraded, sugars should start to trend upward.  Will be important for physician at  skilled nursing facility to try to titrate up patient's Lantus accordingly.  NovoLog FlexPen really should be used 3 times a day before meals 30 units once a day.  Will discharge on Lantus 30 units subcu daily.  Obstructive sleep apnea Patient most likely has underlying obstructive sleep apnea related to morbid obesity as well as possible obesity hypoventilation syndrome. Her pulse oximetry drops in the low 80s while asleep.  We will see about starting nightly CPAP.  Hypertension Now that she is weaned off of pressor support, blood pressure remained stable.  On Demadex.  She was taking Lasix and Lopressor at home.  Blood pressure has been stable and would favor discontinuing her Lopressor altogether as her heart rate has been in the 50s to 60s without the beta-blocker.  Acute respiratory failure with hypoxia and hypercapnia (HCC)-resolved as of 09/03/2021 Secondary to septic shock.  Initially requiring ventilator support, now resolved and off of vent.  Currently on 2L nasal cannula.  Morbid obesity (HCC) Meets criteria with BMI greater than 50 Complicates overall prognosis and care  Severe aortic stenosis Status post TAVR  CAD (coronary artery disease) Stable Continue statins Hold metoprolol due to hypotension  Dysphagia With deconditioning, once patient off of ventilator, seen by speech therapy and placed on restricted diet.  Currently she is on a dysphagia 1 with nectar thick liquid diet.  Plan will be for speech therapy to follow patient at skilled nursing and reevaluate.  I imagine  that she gets stronger, her diet can be upgraded.         Consultants: Critical care, urology, nephrology Procedures performed: Ventilator from 3/2 - 3/9 Disposition: Skilled nursing facility Diet recommendation:  Discharge Diet Orders (From admission, onward)     Start     Ordered   09/06/21 0000  DIET - DYS 1       Question:  Fluid consistency:  Answer:  Nectar Thick   09/06/21 1251            Dysphagia 1 diet with nectar thick liquids  DISCHARGE MEDICATION: Allergies as of 09/06/2021       Reactions   Tetanus Toxoid, Adsorbed Swelling   Tetanus Toxoid    Other reaction(s): Unknown   Tetanus Toxoids Swelling   Lisinopril Rash   Other reaction(s): Unknown   Niacin Rash   Other reaction(s): Unknown   Sitagliptin Rash   Other reaction(s): Unknown   Sulfamethoxazole-trimethoprim Rash   Other reaction(s): Unknown        Medication List     STOP taking these medications    amLODipine 5 MG tablet Commonly known as: NORVASC   Basaglar KwikPen 100 UNIT/ML   cefpodoxime 100 MG tablet Commonly known as: VANTIN   furosemide 40 MG tablet Commonly known as: LASIX   metFORMIN 1000 MG tablet Commonly known as: GLUCOPHAGE   metoprolol tartrate 25 MG tablet Commonly known as: LOPRESSOR   NovoLOG FlexPen 100 UNIT/ML FlexPen Generic drug: insulin aspart   Potassium Chloride ER 20 MEQ Tbcr       TAKE these medications    acetaminophen 325 MG tablet Commonly known as: TYLENOL Take 2 tablets (650 mg total) by mouth every 6 (six) hours as needed for mild pain (or Fever >/= 101).   apixaban 5 MG Tabs tablet Commonly known as: ELIQUIS Take 1 tablet (5 mg total) by mouth 2 (two) times daily.   atorvastatin 40 MG tablet Commonly known as: LIPITOR Take 1 tablet (40 mg total) by mouth daily at 6 PM.   BD Pen Needle Nano 2nd Gen 32G X 4 MM Misc Generic drug: Insulin Pen Needle 4 (four) times daily. use as directed   Cholecalciferol 50 MCG (2000 UT) Caps Take 1 capsule (2,000 Units total) by mouth daily.   docusate sodium 100 MG capsule Commonly known as: COLACE Take 1 capsule (100 mg total) by mouth daily.   feeding supplement (NEPRO CARB STEADY) Liqd Take 237 mLs by mouth 3 (three) times daily between meals.   fenofibrate 145 MG tablet Commonly known as: TRICOR Take 1 tablet (145 mg total) by mouth daily.   insulin detemir 100 UNIT/ML  FlexPen Commonly known as: LEVEMIR Inject 30 Units into the skin daily.   multivitamin with minerals Tabs tablet Take 1 tablet by mouth daily. Start taking on: September 07, 2021   polyethylene glycol 17 g packet Commonly known as: MIRALAX / GLYCOLAX Take 17 g by mouth daily as needed for mild constipation.   Torsemide 40 MG Tabs Take 20 mg by mouth daily. Start taking on: September 07, 2021        Contact information for follow-up providers     Sondra Come, MD. Schedule an appointment as soon as possible for a visit in 2 week(s).   Specialty: Urology Contact information: [address] [place] [address] [phone]         Mady Haagensen, MD. Schedule an appointment as soon as possible for a visit in 3 week(s).  Specialty: Nephrology Contact information: [address] [place] [address] [phone]              Contact information for after-discharge care     Destination     [facility] Preferred SNF .   Service: Skilled Nursing Contact information: [address] [place] [address] [phone]                    Discharge Exam: Ceasar Mons Weights   09/02/21 0500 09/03/21 0500 09/04/21 0450  Weight: (!) 144.5 kg (!) 145.8 kg (!) 143.8 kg   General: Alert and oriented x3, no acute distress Cardiovascular: Regular rate and rhythm, S1-S2 Lungs: Decreased breath sounds throughout secondary to body habitus  Condition at discharge: good  The results of significant diagnostics from this hospitalization (including imaging, microbiology, ancillary and laboratory) are listed below for reference.   Imaging Studies: CT ABDOMEN PELVIS WO CONTRAST  Result Date: 08/25/2021 CLINICAL DATA:  Sepsis presentation.  Foul-smelling urine. EXAM: CT ABDOMEN AND PELVIS WITHOUT CONTRAST TECHNIQUE: Multidetector CT imaging of the abdomen and pelvis was performed following the standard protocol without IV contrast.  RADIATION DOSE REDUCTION: This exam was performed according to the departmental dose-optimization program which includes automated exposure control, adjustment of the mA and/or kV according to patient size and/or use of iterative reconstruction technique. COMPARISON:  CT 01/31/2020 FINDINGS: Lower chest: See results of chest CT.  Edema/ARDS versus pneumonia. Hepatobiliary: Liver parenchyma is normal. Calcified gallstone as seen previously. No CT evidence of cholecystitis or obstruction. Pancreas: Fatty replacement of the pancreas. No acute pancreatic pathology. 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 Maximal dimension 13 mm. 3 mm nonobstructing stone in the lower pole the right kidney. Two adjacent stones within the proximal right ureter, each approximately 6 mm in size, with mild right hydronephrosis. Several small nonobstructing stones in the left kidney. 1 cm stone in the left renal pelvis without obstruction presently. This would have potential for bowl bowel obstruction. Bladder appears unremarkable. Stomach/Bowel: Stomach and small intestine are normal. No colon pathology seen. Vascular/Lymphatic: Aortic atherosclerosis. No aneurysm. IVC is normal. No adenopathy. Reproductive: No pelvic mass. Other: No free fluid or air. Musculoskeletal: Chronic lumbar degenerative changes. IMPRESSION: Hydronephrosis on the right due to 2 adjacent 6 mm stones in the proximal right ureter, at about the level of the lower pole of the kidney. Possibility of infection associated with the obstruction does exist. 1 cm stone in the left renal pelvis without evidence of obstruction presently. Chololithiasis without CT evidence of cholecystitis or obstruction. Insignificant 13 mm adrenal adenoma, unchanged since previous exam. Electronically Signed   By: Paulina Fusi M.D.   On: 08/25/2021 14:32   DG Chest 1 View  Result Date: 08/26/2021 CLINICAL DATA:  Post placement of  central venous catheter EXAM: CHEST  1 VIEW COMPARISON:  Portable exam 1512 hours compared to 08/25/2021 FINDINGS: Tip of endotracheal tube projects 4.0 cm above carina. Nasogastric tube extends into stomach. RIGHT jugular line tip projects over cavoatrial junction. New LEFT jugular central venous catheter tip projects over SVC. LEFT subclavian sequential pacemaker leads project over RIGHT atrium and RIGHT ventricle. Upper normal size of cardiac silhouette post TAVR. Perihilar infiltrates question edema. Bibasilar atelectasis. No pneumothorax. IMPRESSION: No pneumothorax following LEFT jugular line placement. Perihilar infiltrates question pulmonary edema with bibasilar atelectasis. Electronically Signed   By: Ulyses Southward M.D.   On: 08/26/2021 15:30  DG Abd 1 View  Result Date: 08/26/2021 CLINICAL DATA:  NG tube placement. EXAM: ABDOMEN - 1 VIEW COMPARISON:  CT of the abdomen and pelvis 08/25/2021 FINDINGS: Side port of the NG tube is in the stomach. Bowel gas pattern is unremarkable. Bilateral ureteral stents are now in place. IMPRESSION: NG tube in the stomach. Electronically Signed   By: Marin Roberts M.D.   On: 08/26/2021 10:13   CT HEAD WO CONTRAST ( )  Result Date: 08/31/2021 CLINICAL DATA:  Mental status change, unknown cause EXAM: CT HEAD WITHOUT CONTRAST TECHNIQUE: Contiguous axial images were obtained from the base of the skull through the vertex without intravenous contrast. RADIATION DOSE REDUCTION: This exam was performed according to the departmental dose-optimization program which includes automated exposure control, adjustment of the mA and/or kV according to patient size and/or use of iterative reconstruction technique. COMPARISON:  None. FINDINGS: Brain: No evidence of acute infarction, hemorrhage, hydrocephalus, extra-axial collection or mass lesion/mass effect. Scattered low-density changes within the periventricular and subcortical white matter compatible with chronic  microvascular ischemic change. Mild-moderate diffuse cerebral volume loss. Vascular: No hyperdense vessel or unexpected calcification. Skull: Normal. Negative for fracture or focal lesion. Sinuses/Orbits: No acute finding. Other: None. IMPRESSION: 1. No acute intracranial abnormality. 2. Chronic microvascular ischemic change and cerebral volume loss. Electronically Signed   By: Duanne Guess D.O.   On: 08/31/2021 14:11   CT Chest Wo Contrast  Result Date: 08/25/2021 CLINICAL DATA:  Sepsis. Fell smelling urine. Chronic lower extremity wounds. Altered mental status. EXAM: CT CHEST WITHOUT CONTRAST TECHNIQUE: Multidetector CT imaging of the chest was performed following the standard protocol without IV contrast. RADIATION DOSE REDUCTION: This exam was performed according to the departmental dose-optimization program which includes automated exposure control, adjustment of the mA and/or kV according to patient size and/or use of iterative reconstruction technique. COMPARISON:  Chest radiography same day. Chest CT 07/11/2017. CT abdomen 01/31/2020. FINDINGS: Cardiovascular: The heart is enlarged. Previous aortic stent placement. Pacemaker in place. Mediastinum/Nodes: No mass or lymphadenopathy. Lungs/Pleura: Bilateral perihilar airspace filling pattern most consistent with edema/ARDS. Pneumonia is possible. Findings worse on the left than the right. No lobar consolidation or collapse. No effusion. Upper Abdomen: See results of abdominal CT Musculoskeletal: Negative IMPRESSION: Bilateral perihilar airspace filling pattern left more than right most consistent with acute edema/ARDS. Pneumonia not excluded, but there is no dense consolidation, lobar distribution or collapse. No effusion. Aortic Atherosclerosis (ICD10-I70.0). Electronically Signed   By: Paulina Fusi M.D.   On: 08/25/2021 14:27   DG Chest Port 1 View  Result Date: 08/30/2021 CLINICAL DATA:  Acute respiratory failure EXAM: PORTABLE CHEST 1 VIEW  COMPARISON:  08/26/2021 FINDINGS: Bilateral interstitial and alveolar airspace opacities most severe at the right lung base concerning for multilobar pneumonia versus pulmonary edema. No pleural effusion or pneumothorax. Stable cardiomegaly. Prior TAVR. Dual lead cardiac pacemaker. Right jugular central venous catheter with the tip projecting over the cavoatrial junction. Left jugular central venous catheter with the tip projecting over the SVC. No acute osseous abnormality IMPRESSION: 1. Bilateral interstitial and alveolar airspace opacities most severe at the right lung base concerning for multilobar pneumonia versus pulmonary edema. 2. Stable cardiomegaly. Electronically Signed   By: Elige Ko M.D.   On: 08/30/2021 09:12   DG Chest Port 1 View  Result Date: 08/25/2021 CLINICAL DATA:  Central venous catheter placement EXAM: PORTABLE CHEST 1 VIEW COMPARISON:  08/25/2021 FINDINGS: Endotracheal tube seen 4 cm above the carina. Right internal jugular central venous catheter tip  noted within the right atrium. Lung volumes are small, but are symmetric and are stable since prior examination. Superimposed perihilar pulmonary infiltrate persists in keeping with probable mild perihilar pulmonary edema. No pneumothorax or pleural effusion. Transcatheter aortic valve replacement has been performed. Mild cardiomegaly is stable. Left subclavian dual lead pacemaker is unchanged. IMPRESSION: Right internal jugular central venous catheter tip within the right atrium. No pneumothorax. Stable pulmonary insufflation. Stable mild perihilar pulmonary edema, possibly cardiogenic in nature Electronically Signed   By: Helyn Numbers M.D.   On: 08/25/2021 21:39   DG Chest Port 1 View  Result Date: 08/25/2021 CLINICAL DATA:  Intubation.  Ventilator support. EXAM: PORTABLE CHEST 1 VIEW COMPARISON:  Earlier same day FINDINGS: Endotracheal tube tip 4 cm above the carina. Cardiomegaly. Previous aortic stent. Mild interstitial and  alveolar edema. No pulmonary collapse. IMPRESSION: Endotracheal tube tip 4 cm above the carina. Mild edema again noted. Electronically Signed   By: Paulina Fusi M.D.   On: 08/25/2021 19:31   DG Chest Port 1 View  Result Date: 08/25/2021 CLINICAL DATA:  Sepsis. EXAM: PORTABLE CHEST 1 VIEW COMPARISON:  July 11, 2017. FINDINGS: Stable cardiomegaly. Status post transcatheter aortic valve repair. Interval placement of left-sided pacemaker with leads in grossly good position. Left lung is clear. Mild right basilar subsegmental atelectasis or infiltrate is noted. Bony thorax is unremarkable. IMPRESSION: Mild right basilar subsegmental atelectasis or infiltrate is noted. Electronically Signed   By: Lupita Raider M.D.   On: 08/25/2021 12:49   DG OR UROLOGY CYSTO IMAGE ([facility] ONLY)  Result Date: 08/25/2021 There is no interpretation for this exam.  This order is for images obtained during a surgical procedure.  Please See "Surgeries" Tab for more information regarding the procedure.   ECHOCARDIOGRAM COMPLETE  Result Date: 08/27/2021    ECHOCARDIOGRAM REPORT   Patient Name:   RIYAH TEIG Date of Exam: 08/26/2021 Medical Rec #:  [id number]        Height:       65.0 in Accession #:    [id number]       Weight:       342.6 lb Date of Birth:  January 26, 1951       BSA:          2.487 m Patient Age:    70 years         BP:           96/65 mmHg Patient Gender: F                HR:           62 bpm. Exam Location:  [facility] Procedure: 2D Echo, Color Doppler, Cardiac Doppler and Intracardiac            Opacification Agent Indications:     I50.31 congestive heart failure-Acute Diastolic  History:         Patient has prior history of Echocardiogram examinations. CAD,                  Pacemaker, Aortic Valve Disease; Risk Factors:Diabetes,                  Hypertension, Dyslipidemia and Sleep Apnea.  Sonographer:     Humphrey Rolls Referring Phys:  [id number] AGBATA Diagnosing Phys: Marcina Millard MD  Sonographer Comments:  Technically difficult study due to poor echo windows and echo performed with patient supine and on artificial respirator. Image acquisition challenging due to patient body habitus. IMPRESSIONS  1. Left ventricular ejection fraction, by estimation, is 45 to 50%. The left ventricle has mildly decreased function. The left ventricle has no regional wall motion abnormalities. Left ventricular diastolic parameters are indeterminate.  2. Right ventricular systolic function is normal. The right ventricular size is normal.  3. The mitral valve is normal in structure. Mild mitral valve regurgitation. No evidence of mitral stenosis.  4. The aortic valve is normal in structure. Aortic valve regurgitation is not visualized. No aortic stenosis is present.  5. The inferior vena cava is normal in size with greater than 50% respiratory variability, suggesting right atrial pressure of 3 mmHg. FINDINGS  Left Ventricle: Left ventricular ejection fraction, by estimation, is 45 to 50%. The left ventricle has mildly decreased function. The left ventricle has no regional wall motion abnormalities. Definity contrast agent was given IV to delineate the left ventricular endocardial borders. The left ventricular internal cavity size was normal in size. There is no left ventricular hypertrophy. Left ventricular diastolic parameters are indeterminate. Right Ventricle: The right ventricular size is normal. No increase in right ventricular wall thickness. Right ventricular systolic function is normal. Left Atrium: Left atrial size was normal in size. Right Atrium: Right atrial size was normal in size. Pericardium: There is no evidence of pericardial effusion. Mitral Valve: The mitral valve is normal in structure. Mild mitral valve regurgitation. No evidence of mitral valve stenosis. MV peak gradient, 6.8 mmHg. The mean mitral valve gradient is 2.0 mmHg. Tricuspid Valve: The tricuspid valve is normal in structure. Tricuspid valve regurgitation is  mild . No evidence of tricuspid stenosis. Aortic Valve: The aortic valve is normal in structure. Aortic valve regurgitation is not visualized. No aortic stenosis is present. Aortic valve mean gradient measures 5.0 mmHg. Aortic valve peak gradient measures 8.3 mmHg. Aortic valve area, by VTI measures 1.45 cm. There is a CoreValve-Evolut Pro prosthetic, stented (TAVR) valve present in the aortic position. Pulmonic Valve: The pulmonic valve was normal in structure. Pulmonic valve regurgitation is not visualized. No evidence of pulmonic stenosis. Aorta: The aortic root is normal in size and structure. Venous: The inferior vena cava is normal in size with greater than 50% respiratory variability, suggesting right atrial pressure of 3 mmHg. IAS/Shunts: No atrial level shunt detected by color flow Doppler.  LEFT VENTRICLE PLAX 2D LVIDd:         4.39 cm   Diastology LVIDs:         3.52 cm   LV e' lateral:   7.72 cm/s LV PW:         1.36 cm   LV E/e' lateral: 15.4 LV IVS:        1.21 cm LVOT diam:     1.80 cm LV SV:         38 LV SV Index:   15 LVOT Area:     2.54 cm  LEFT ATRIUM           Index LA diam:      4.60 cm 1.85 cm/m LA Vol (A4C): 71.7 ml 28.83 ml/m  AORTIC VALVE                     PULMONIC VALVE AV Area (Vmax):    1.69 cm      PV Vmax:       1.07 m/s AV Area (Vmean):   1.47 cm      PV Vmean:      65.900 cm/s AV Area (VTI):  1.45 cm      PV VTI:        0.140 m AV Vmax:           144.00 cm/s   PV Peak grad:  4.6 mmHg AV Vmean:          109.000 cm/s  PV Mean grad:  2.0 mmHg AV VTI:            0.259 m AV Peak Grad:      8.3 mmHg AV Mean Grad:      5.0 mmHg LVOT Vmax:         95.80 cm/s LVOT Vmean:        63.000 cm/s LVOT VTI:          0.148 m LVOT/AV VTI ratio: 0.57  AORTA Ao Root diam: 2.20 cm MITRAL VALVE                TRICUSPID VALVE MV Area (PHT): 1.94 cm     TR Peak grad:   44.9 mmHg MV Area VTI:   0.97 cm     TR Vmax:        335.00 cm/s MV Peak grad:  6.8 mmHg MV Mean grad:  2.0 mmHg     SHUNTS MV  Vmax:       1.30 m/s     Systemic VTI:  0.15 m MV Vmean:      58.0 cm/s    Systemic Diam: 1.80 cm MV Decel Time: 392 msec MV E velocity: 119.00 cm/s Marcina Millard MD Electronically signed by Marcina Millard MD Signature Date/Time: 08/27/2021/9:29:26 AM    Final     Microbiology: Results for orders placed or performed during the hospital encounter of 08/25/21  Blood Culture (routine x 2)     Status: Abnormal   Collection Time: 08/25/21 12:29 PM   Specimen: BLOOD RIGHT HAND  Result Value Ref Range Status   Specimen Description   Final    BLOOD RIGHT HAND Performed at [facility], [address] [place], [place] [address]    Special Requests   Final    BOTTLES DRAWN AEROBIC AND ANAEROBIC Blood Culture results may not be optimal due to an inadequate volume of blood received in culture bottles Performed at [facility], [address] [place], [place] [address]    Culture  Setup Time   Final    GRAM NEGATIVE RODS IN BOTH AEROBIC AND ANAEROBIC BOTTLES CRITICAL RESULT CALLED TO, READ BACK BY AND VERIFIED WITH: MORGAN HICKS AT 2207 ON 08/25/21 BY SS Performed at [facility], [address], [place], [place] [address]    Culture ESCHERICHIA COLI (A)  Final   Report Status 08/28/2021 FINAL  Final   Organism ID, Bacteria ESCHERICHIA COLI  Final      Susceptibility   Escherichia coli - MIC*    AMPICILLIN 8 SENSITIVE Sensitive     CEFAZOLIN <=4 SENSITIVE Sensitive     CEFEPIME <=0.12 SENSITIVE Sensitive     CEFTAZIDIME <=1 SENSITIVE Sensitive     CEFTRIAXONE <=0.25 SENSITIVE Sensitive     CIPROFLOXACIN <=0.25 SENSITIVE Sensitive     GENTAMICIN <=1 SENSITIVE Sensitive     IMIPENEM <=0.25 SENSITIVE Sensitive     TRIMETH/SULFA <=20 SENSITIVE Sensitive     AMPICILLIN/SULBACTAM <=2 SENSITIVE Sensitive     PIP/TAZO <=4 SENSITIVE Sensitive     * ESCHERICHIA COLI  Blood Culture ID Panel (Reflexed)     Status: Abnormal   Collection Time: 08/25/21  12:29 PM   Result Value Ref Range Status   Enterococcus faecalis NOT DETECTED NOT DETECTED Final   Enterococcus Faecium NOT DETECTED NOT DETECTED Final   Listeria monocytogenes NOT DETECTED NOT DETECTED Final   Staphylococcus species NOT DETECTED NOT DETECTED Final   Staphylococcus aureus (BCID) NOT DETECTED NOT DETECTED Final   Staphylococcus epidermidis NOT DETECTED NOT DETECTED Final   Staphylococcus lugdunensis NOT DETECTED NOT DETECTED Final   Streptococcus species NOT DETECTED NOT DETECTED Final   Streptococcus agalactiae NOT DETECTED NOT DETECTED Final   Streptococcus pneumoniae NOT DETECTED NOT DETECTED Final   Streptococcus pyogenes NOT DETECTED NOT DETECTED Final   A.calcoaceticus-baumannii NOT DETECTED NOT DETECTED Final   Bacteroides fragilis NOT DETECTED NOT DETECTED Final   Enterobacterales DETECTED (A) NOT DETECTED Final    Comment: Enterobacterales represent a large order of gram negative bacteria, not a single organism. CRITICAL RESULT CALLED TO, READ BACK BY AND VERIFIED WITH: MORGAN HICKS AT 2207 ON 08/25/21 BY SS    Enterobacter cloacae complex NOT DETECTED NOT DETECTED Final   Escherichia coli DETECTED (A) NOT DETECTED Final    Comment: CRITICAL RESULT CALLED TO, READ BACK BY AND VERIFIED WITH: MORGAN HICKS AT 2207 ON 08/25/21 BY SS    Klebsiella aerogenes NOT DETECTED NOT DETECTED Final   Klebsiella oxytoca NOT DETECTED NOT DETECTED Final   Klebsiella pneumoniae NOT DETECTED NOT DETECTED Final   Proteus species NOT DETECTED NOT DETECTED Final   Salmonella species NOT DETECTED NOT DETECTED Final   Serratia marcescens NOT DETECTED NOT DETECTED Final   Haemophilus influenzae NOT DETECTED NOT DETECTED Final   Neisseria meningitidis NOT DETECTED NOT DETECTED Final   Pseudomonas aeruginosa NOT DETECTED NOT DETECTED Final   Stenotrophomonas maltophilia NOT DETECTED NOT DETECTED Final   Candida albicans NOT DETECTED NOT DETECTED Final   Candida auris NOT DETECTED NOT DETECTED  Final   Candida glabrata NOT DETECTED NOT DETECTED Final   Candida krusei NOT DETECTED NOT DETECTED Final   Candida parapsilosis NOT DETECTED NOT DETECTED Final   Candida tropicalis NOT DETECTED NOT DETECTED Final   Cryptococcus neoformans/gattii NOT DETECTED NOT DETECTED Final   CTX-M ESBL NOT DETECTED NOT DETECTED Final   Carbapenem resistance IMP NOT DETECTED NOT DETECTED Final   Carbapenem resistance KPC NOT DETECTED NOT DETECTED Final   Carbapenem resistance NDM NOT DETECTED NOT DETECTED Final   Carbapenem resist OXA 48 LIKE NOT DETECTED NOT DETECTED Final   Carbapenem resistance VIM NOT DETECTED NOT DETECTED Final    Comment: Performed at [facility], [address], [place], [place] [address]  Urine Culture     Status: Abnormal   Collection Time: 08/25/21  1:47 PM   Specimen: In/Out Cath Urine  Result Value Ref Range Status   Specimen Description   Final    IN/OUT CATH URINE Performed at [facility], [address], [place], [place] [address]    Special Requests   Final    NONE Performed at [facility], [address] [place], [place] [address]    Culture >=100,000 COLONIES/mL ESCHERICHIA COLI (A)  Final   Report Status 08/28/2021 FINAL  Final   Organism ID, Bacteria ESCHERICHIA COLI (A)  Final      Susceptibility   Escherichia coli - MIC*    AMPICILLIN 8 SENSITIVE Sensitive     CEFAZOLIN <=4 SENSITIVE Sensitive     CEFEPIME <=0.12 SENSITIVE Sensitive     CEFTRIAXONE <=0.25 SENSITIVE Sensitive     CIPROFLOXACIN <=0.25 SENSITIVE  Sensitive     GENTAMICIN <=1 SENSITIVE Sensitive     IMIPENEM <=0.25 SENSITIVE Sensitive     NITROFURANTOIN <=16 SENSITIVE Sensitive     TRIMETH/SULFA <=20 SENSITIVE Sensitive     AMPICILLIN/SULBACTAM <=2 SENSITIVE Sensitive     PIP/TAZO <=4 SENSITIVE Sensitive     * >=100,000 COLONIES/mL ESCHERICHIA COLI  Resp Panel by RT-PCR (Flu A&B, Covid) Nasopharyngeal Swab     Status: None   Collection Time: 08/25/21   3:05 PM   Specimen: Nasopharyngeal Swab; Nasopharyngeal(NP) swabs in vial transport medium  Result Value Ref Range Status   SARS Coronavirus 2 by RT PCR NEGATIVE NEGATIVE Final    Comment: (NOTE) SARS-CoV-2 target nucleic acids are NOT DETECTED.  The SARS-CoV-2 RNA is generally detectable in upper respiratory specimens during the acute phase of infection. The lowest concentration of SARS-CoV-2 viral copies this assay can detect is 138 copies/mL. A negative result does not preclude SARS-Cov-2 infection and should not be used as the sole basis for treatment or other patient management decisions. A negative result may occur with  improper specimen collection/handling, submission of specimen other than nasopharyngeal swab, presence of viral mutation(s) within the areas targeted by this assay, and inadequate number of viral copies(<138 copies/mL). A negative result must be combined with clinical observations, patient history, and epidemiological information. The expected result is Negative.  Fact Sheet for Patients:  BloggerCourse.com  Fact Sheet for Healthcare Providers:  SeriousBroker.it  This test is no t yet approved or cleared by the [place] FDA and  has been authorized for detection and/or diagnosis of SARS-CoV-2 by FDA under an Emergency Use Authorization (EUA). This EUA will remain  in effect (meaning this test can be used) for the duration of the COVID-19 declaration under Section 564(b)(1) of the Act, 21 U.S.C.section 360bbb-3(b)(1), unless the authorization is terminated  or revoked sooner.       Influenza A by PCR NEGATIVE NEGATIVE Final   Influenza B by PCR NEGATIVE NEGATIVE Final    Comment: (NOTE) The Xpert Xpress SARS-CoV-2/FLU/RSV plus assay is intended as an aid in the diagnosis of influenza from Nasopharyngeal swab specimens and should not be used as a sole basis for treatment. Nasal washings and aspirates  are unacceptable for Xpert Xpress SARS-CoV-2/FLU/RSV testing.  Fact Sheet for Patients: BloggerCourse.com  Fact Sheet for Healthcare Providers: SeriousBroker.it  This test is not yet approved or cleared by the [place] FDA and has been authorized for detection and/or diagnosis of SARS-CoV-2 by FDA under an Emergency Use Authorization (EUA). This EUA will remain in effect (meaning this test can be used) for the duration of the COVID-19 declaration under Section 564(b)(1) of the Act, 21 U.S.C. section 360bbb-3(b)(1), unless the authorization is terminated or revoked.  Performed at [facility], [address] [place], [place] [address]   MRSA Next Gen by PCR, Nasal     Status: None   Collection Time: 08/25/21  6:44 PM   Specimen: Nasal Mucosa; Nasal Swab  Result Value Ref Range Status   MRSA by PCR Next Gen NOT DETECTED NOT DETECTED Final    Comment: (NOTE) The GeneXpert MRSA Assay (FDA approved for NASAL specimens only), is one component of a comprehensive MRSA colonization surveillance program. It is not intended to diagnose MRSA infection nor to guide or monitor treatment for MRSA infections. Test performance is not FDA approved in patients less than 3 years old. Performed at [facility], [address], [place], [place] [address]   Blood  Culture (routine x 2)     Status: None   Collection Time: 08/25/21  7:38 PM   Specimen: BLOOD  Result Value Ref Range Status   Specimen Description BLOOD RIGHT HAND  Final   Special Requests   Final    BOTTLES DRAWN AEROBIC AND ANAEROBIC Blood Culture adequate volume   Culture   Final    NO GROWTH 5 DAYS Performed at [facility], [address] [place], [place] [address]    Report Status 08/30/2021 FINAL  Final  Culture, Respiratory w Gram Stain     Status: None   Collection Time: 08/26/21  8:21 AM   Specimen: Tracheal Aspirate; Respiratory   Result Value Ref Range Status   Specimen Description   Final    TRACHEAL ASPIRATE Performed at [facility], [address], [place], [place] [address]    Special Requests   Final    NONE Performed at [facility], [address] [place], [place] [address]    Gram Stain   Final    RARE SQUAMOUS EPITHELIAL CELLS PRESENT RARE WBC PRESENT, PREDOMINANTLY MONONUCLEAR NO ORGANISMS SEEN    Culture   Final    RARE Normal respiratory flora-no Staph aureus or Pseudomonas seen Performed at [facility], [address], [place], [place] [address]    Report Status 08/29/2021 FINAL  Final    Labs: CBC: Recent Labs  Lab 08/31/21 0446 09/01/21 0444 09/02/21 0455 09/03/21 0435 09/04/21 0413  WBC 16.0* 14.8* 13.0* 13.3* 11.0*  HGB 11.1* 10.7* 10.4* 10.5* 10.8*  HCT 35.0* 34.6* 33.8* 34.7* 35.2*  MCV 91.4 93.3 94.2 97.2 93.6  PLT 91* 95* 104* 117* 122*   Basic Metabolic Panel: Recent Labs  Lab 09/02/21 0455 09/03/21 0435 09/04/21 0413 09/05/21 0416 09/06/21 0403  NA 145 147* 146* 148* 145  K 3.8 3.6 3.5 3.7 3.6  CL 109 110 109 107 106  CO2 29 28 27 30 30   GLUCOSE 118* 111* 167* 124* 103*  BUN 60* 61* 61* 57* 53*  CREATININE 1.98* 1.90* 1.89* 1.83* 1.68*  CALCIUM 7.9* 8.1* 8.3* 8.7* 8.5*  MG 2.4 2.1 2.2 2.0 1.9  PHOS 3.7 3.9 3.0 2.9 3.3   Liver Function Tests: Recent Labs  Lab 09/02/21 0455 09/03/21 0435 09/04/21 0413 09/05/21 0416 09/06/21 0403  ALBUMIN 2.4* 2.5* 2.6* 2.6* 2.8*   CBG: Recent Labs  Lab 09/05/21 1958 09/05/21 2244 09/06/21 0430 09/06/21 0725 09/06/21 1250  GLUCAP 202* 164* 99 120* 182*    Discharge time spent: less than 30 minutes.  Signed: Hollice Espy, MD [facility] 09/06/2021

## 2021-09-06 NOTE — Assessment & Plan Note (Signed)
With deconditioning, once patient off of ventilator, seen by speech therapy and placed on restricted diet.  Currently she is on a dysphagia 1 with nectar thick liquid diet.  Plan will be for speech therapy to follow patient at skilled nursing and reevaluate.  I imagine that she gets stronger, her diet can be upgraded. ?

## 2021-09-06 NOTE — Progress Notes (Signed)
Report called to Juliann Pulse, LPN at [employer] [employer]. Transport arranged. ?

## 2021-09-06 NOTE — Progress Notes (Signed)
[facility] ?[place], [place] ?09/06/21 ? ?Subjective:  ? ?Hospital day # 12 ? ?Patient resting quietly ?Denies pain and discomfort ?Appetite appropriate ?No lower extremity edema  ? ?03/13 0701 - 03/14 0700 ?In: 1758.3 [P.O.:480; I.V.:1278.3] ?Out: 3350 [Urine:3350] ?Lab Results  ?Component Value Date  ? CREATININE 1.68 (H) 09/06/2021  ? CREATININE 1.83 (H) 09/05/2021  ? CREATININE 1.89 (H) 09/04/2021  ? ? ?Objective:  ?Vital signs in last 24 hours:  ?Temp:  [97.9 ?F (36.6 ?C)-98.3 ?F (36.8 ?C)] 98.1 ?F (36.7 ?C) (03/14 0424) ?Pulse Rate:  [59-67] 62 (03/14 0424) ?Resp:  [17-22] 18 (03/14 0424) ?BP: (132-148)/(58-107) 138/64 (03/14 0424) ?SpO2:  [93 %-95 %] 94 % (03/14 0424) ? ?Weight change:  ?Filed Weights  ? 09/02/21 0500 09/03/21 0500 09/04/21 0450  ?Weight: (!) 144.5 kg (!) 145.8 kg (!) 143.8 kg  ? ? ?Intake/Output: ?  ? ?Intake/Output Summary (Last 24 hours) at 09/06/2021 1204 ?Last data filed at 09/06/2021 0600 ?Gross per 24 hour  ?Intake 1278.34 ml  ?Output 3350 ml  ?Net -2071.66 ml  ? ? ? ? ?Physical Exam: ?General No acute distress  ?HEENT Moist oral membrane  ?Pulm/lungs Bilateral rales, normal effort  ?CVS/Heart Irregular, paced rhythm  ?Abdomen:  Soft NTND  ?Extremities: 1+ lower extremity edema  ?Neurologic: Alert, oriented, moving all 4 extremities  ?Access: None  ?GU: Foley in place   ? ? ?Basic Metabolic Panel: ? ?Recent Labs  ?Lab 09/02/21 ?0455 09/03/21 ?6962 09/04/21 ?0413 09/05/21 ?9528 09/06/21 ?0403  ?NA 145 147* 146* 148* 145  ?K 3.8 3.6 3.5 3.7 3.6  ?CL 109 110 109 107 106  ?CO2 _0 ?GLUCOSE 118* 111* 167* 124* 103*  ?BUN 60* 61* 61* 57* 53*  ?CREATININE 1.98* 1.90* 1.89* 1.83* 1.68*  ?CALCIUM 7.9* 8.1* 8.3* 8.7* 8.5*  ?MG 2.4 2.1 2.2 2.0 1.9  ?PHOS 3.7 3.9 3.0 2.9 3.3  ? ? ? ? ?CBC: ?Recent Labs  ?Lab 08/31/21 ?0446 09/01/21 ?0444 09/02/21 ?0455 09/03/21 ?4132 09/04/21 ?0413  ?WBC 16.0* 14.8* 13.0* 13.3* 11.0*  ?HGB 11.1* 10.7* 10.4* 10.5* 10.8*  ?HCT 35.0*  34.6* 33.8* 34.7* 35.2*  ?MCV 91.4 93.3 94.2 97.2 93.6  ?PLT 91* 95* 104* 117* 122*  ? ? ? ?  ?Lab Results  ?Component Value Date  ? HEPBSAG NON REACTIVE 08/31/2021  ? HEPBSAB NON REACTIVE 08/31/2021  ? ? ? ? ?Microbiology: ? ?No results found for this or any previous visit (from the past 240 hour(s)). ? ? ?Coagulation Studies: ?No results for input(s): LABPROT, INR in the last 72 hours. ? ? ?Urinalysis: ?No results for input(s): COLORURINE, LABSPEC, [place], GLUCOSEU, HGBUR, BILIRUBINUR, KETONESUR, PROTEINUR, UROBILINOGEN, NITRITE, LEUKOCYTESUR in the last 72 hours. ? ?Invalid input(s): APPERANCEUR  ? ? ?Imaging: ?No results found. ? ? ?Medications:  ? ? ? ? apixaban  5 mg Oral BID  ? chlorhexidine gluconate (MEDLINE KIT)  15 mL Mouth Rinse BID  ? Chlorhexidine Gluconate Cloth  6 each Topical Daily  ? docusate sodium  100 mg Oral BID  ? feeding supplement (NEPRO CARB STEADY)  237 mL Oral TID BM  ? insulin aspart  0-20 Units Subcutaneous Q4H  ? insulin glargine-yfgn  20 Units Subcutaneous Daily  ? mouth rinse  15 mL Mouth Rinse q12n4p  ? multivitamin with minerals  1 tablet Oral Daily  ? pantoprazole  40 mg Oral Daily  ? polyethylene glycol  17 g Oral Daily  ? sodium chloride flush  10-40 mL  Intracatheter Q12H  ? torsemide  40 mg Oral Daily  ? ?acetaminophen, heparin sodium (porcine), ipratropium-albuterol, ondansetron **OR** ondansetron (ZOFRAN) IV, sodium chloride flush ? ?Assessment/ Plan:  ?71 y.o. female with  morbid obesity (BMI 57), DM, CAD, OSA, HTN, Kidney stones, aortic stenosis with h/o TAVR   admitted on 08/25/2021 for ?Renal colic [B84] ?Sepsis ([place]) [A41.9] ?Urinary tract infection with hematuria, site unspecified [N39.0, R31.9] ?Community acquired pneumonia, unspecified laterality [J18.9] ?Sepsis, due to unspecified organism, unspecified whether acute organ dysfunction present ([place]) [A41.9] ? ? 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  Admit creatinine of 2.31 that peaked at 4.47.  CRRT discontinued 08/29/2021.  UF only treatment on 09/01/2021 net UF 1.5 L. ? ?03/13 0701 - 03/14 0700 ?In: 1758.3 [P.O.:480; I.V.:1278.3] ?Out: 3350 [Urine:3350]  ?  ?Appreciate nursing removing HD temp. Renal function continues to recover. Adequate urine output recorded. Will continue to monitor patient and arrange follow up in our office at discharge.  ?  ? ?#Acute respiratory failure ?Extubated on March 6 ?Room air ? ?# Sepsis with UTI, hypotension, shock ?E Coli Pyelonephritis and bacteremia ?Completed antibiotic course ? ?# Nephrolithiasis with rt proximal stone with hydronephrosis and left UPJ stone without hydronephrosis. On 3/2, patient underwent cystoscopy with bilateral ureteral stents placed by Dr. Diamantina Providence. Patient was found to have purulent urine in bladder. Plan for outpatient b/l uretereroscopy/laser lithotripsy in 1-2 months ?Small stone retrieved by nursing.   ?Awaiting additional testing ? ? ? LOS: 12 ?[place] ?3/14/202312:04 PM ? ?[facility] ?[place], [place] ?[phone] ? ?

## 2021-09-07 ENCOUNTER — Other Ambulatory Visit: Payer: Self-pay | Admitting: Urology

## 2021-09-07 ENCOUNTER — Encounter: Payer: Medicare Other | Admitting: Internal Medicine

## 2021-09-07 DIAGNOSIS — N201 Calculus of ureter: Secondary | ICD-10-CM

## 2021-09-07 LAB — CALCULI, WITH PHOTOGRAPH (CLINICAL LAB): Weight Calculi: <1 mg

## 2021-09-07 NOTE — Progress Notes (Signed)
Surgical Physician Order Form [facility] ? ?* Scheduling expectation : 2 to 4 weeks ? ?*Length of Case: 1 hour ? ?*Clearance needed: no ? ?*Anticoagulation Instructions: May continue all anticoagulants ? ?*Aspirin Instructions: Ok to continue all ? ?*Post-op visit Date/Instructions: TBD ? ?*Diagnosis: Bilateral ureteral stones ? ?*Procedure: bilateral  Ureteroscopy w/laser lithotripsy & stent exchange (18563) ? ? ?Additional orders: N/A ? ?-Admit type: OUTpatient ? ?-Anesthesia: General ? ?-VTE Prophylaxis Standing Order SCD?s    ?   ?Other:  ? ?-Standing Lab Orders Per Anesthesia   ? ?Lab other: UA&Urine Culture ? ?-Standing Test orders EKG/Chest x-ray per Anesthesia      ? ?Test other:  ? ?- Medications:  Ancef 2gm IV ? ?-Other orders:  N/A ? ? ? ?  ? ?

## 2021-09-09 ENCOUNTER — Telehealth: Payer: Self-pay

## 2021-09-09 NOTE — Telephone Encounter (Signed)
I spoke with Thornell Mule of Mrs. [facility] (listed on DPR) as well as [facility] where patient resides. We have discussed possible surgery dates and Friday April 7th, 2023 was agreed upon by all parties. Patient given information about surgery date, what to expect pre-operatively and post operatively.  ?We discussed that a Pre-Admission Testing office will be calling to set up the pre-op visit that will take place prior to surgery, and that these appointments are typically done over the phone with a Pre-Admissions RN.  ?Informed patient that our office will communicate any additional care to be provided after surgery. Patients questions or concerns were discussed during our call. Advised to call our office should there be any additional information, questions or concerns that arise. Patient verbalized understanding.  ? ?

## 2021-09-09 NOTE — Progress Notes (Signed)
[place] [facility] Posting Form  ? ?Surgery Date/Time: Date: 09/30/2021 ? ?Surgeon: Dr. Nickolas Madrid, MD ? ?Surgery Location: Day Surgery ? ?Inpt ( No  )   Outpt (Yes)   Obs ( No  )  ? ?Diagnosis: N20.1 Bilateral Ureteral Stones ? ?-CPT: 79480 ? ?Surgery: Bilateral Ureteroscopy with laser lithotripsy and stent exchange ? ?Stop Anticoagulations: No, may continue ASA ? ?Cardiac/Medical/Pulmonary Clearance needed: no ? ?*Orders entered into EPIC  Date: 09/09/21  ? ?*Case booked in [place]  Date: 09/08/2021 ? ?*Notified pt of Surgery: Date: 09/08/2021 ? ?PRE-OP UA & CX: Yes, will obtain in clinic on 09/21/2021 ? ?*Placed into Prior Authorization Work Fabio Bering Date: 09/09/21 ? ? ?Assistant/laser/rep:No ? ? ? ? ? ? ? ? ? ? ? ? ? ? ? ?

## 2021-09-14 ENCOUNTER — Encounter: Payer: Medicare Other | Admitting: Internal Medicine

## 2021-09-21 ENCOUNTER — Ambulatory Visit (INDEPENDENT_AMBULATORY_CARE_PROVIDER_SITE_OTHER): Payer: Medicare Other | Admitting: Urology

## 2021-09-21 ENCOUNTER — Encounter: Payer: Self-pay | Admitting: Urology

## 2021-09-21 ENCOUNTER — Encounter: Payer: Medicare Other | Admitting: Internal Medicine

## 2021-09-21 ENCOUNTER — Other Ambulatory Visit: Payer: Self-pay

## 2021-09-21 VITALS — BP 119/84 | HR 108 | Ht 65.0 in

## 2021-09-21 DIAGNOSIS — N200 Calculus of kidney: Secondary | ICD-10-CM | POA: Diagnosis not present

## 2021-09-21 NOTE — Progress Notes (Signed)
? ?  09/21/2021 ?11:13 AM  ? ?[name] ?1950-08-14 ?[id number] ? ?Reason for visit: Follow up right ureteral stone, left renal stone, sepsis from urinary source ? ?HPI: ?Extremely comorbid 71 year old female with morbid obesity who presented to the ER with altered mental status and right-sided flank pain on 08/25/2021 and CT a right proximal ureteral stone with hydronephrosis, left renal stone just above the UPJ, and clinical picture was consistent with sepsis from urinary source.  She underwent emergent bilateral ureteral stent placement, and had a prolonged recovery with extended ICU stay, need for temporary dialysis, and prolonged intubation. ? ?We discussed the need for definitive management of her stones with bilateral ureteroscopy, laser lithotripsy, stent change. We specifically discussed the risks ureteroscopy including bleeding, infection/sepsis, stent related symptoms including flank pain/urgency/frequency/incontinence/dysuria, ureteral injury, inability to access stone, or need for staged or additional procedures. ? ?She remains quite frail, and I recommended waiting at least another 4 to 6 weeks prior to pursuing bilateral ureteroscopy to give her some time to get stronger and recover, especially after her prolonged intubation and hospitalization.  She is tolerating the stent well with no flank pain, her baseline urgency/frequency, and no significant dysuria. ? ?Schedule bilateral ureteroscopy, laser lithotripsy, stent placement in 4 to 6 weeks ?Will send urine culture 2 weeks prior to surgery(can be performed at [facility]) ? ?Billey Co, MD ? ?[place] ?[address] ?[place], [place] [address] ?[phone] ? ?

## 2021-09-22 ENCOUNTER — Inpatient Hospital Stay: Admission: RE | Admit: 2021-09-22 | Payer: Medicare Other | Source: Ambulatory Visit

## 2021-09-24 DIAGNOSIS — J9601 Acute respiratory failure with hypoxia: Secondary | ICD-10-CM

## 2021-09-24 HISTORY — DX: Acute respiratory failure with hypoxia: J96.01

## 2021-09-27 ENCOUNTER — Telehealth: Payer: Self-pay | Admitting: Urology

## 2021-09-27 NOTE — Telephone Encounter (Signed)
Spoke with [name]. Patient moved up to 4/28. ?

## 2021-09-27 NOTE — Telephone Encounter (Signed)
Called [name] [facility] of patient on [place]). States that patient has continued to have a UTI. Likely secondary to being bed bound and wearing a diaper with decreased fluid intake. [name] is concerned that she will continue with infections. She wanted to know if there was any possibility that we could move up her surgery date.  ?

## 2021-09-27 NOTE — Telephone Encounter (Signed)
Gregary Signs (calling) wants to move the surgery. Feels like Rishika has a UTI from stones.  Please call her at [phone] ?

## 2021-10-11 ENCOUNTER — Other Ambulatory Visit
Admission: RE | Admit: 2021-10-11 | Discharge: 2021-10-11 | Disposition: A | Discharge status: Home / Self Care | Payer: Medicare Other | Source: Ambulatory Visit | Attending: Urology | Admitting: Urology

## 2021-10-11 HISTORY — DX: Morbid (severe) obesity due to excess calories: E66.01

## 2021-10-11 HISTORY — DX: Personal history of urinary calculi: Z87.442

## 2021-10-11 HISTORY — DX: Acute kidney failure, unspecified: N17.9

## 2021-10-11 HISTORY — DX: Dysphagia, unspecified: R13.10

## 2021-10-11 HISTORY — DX: Sepsis due to Escherichia coli (e. coli): A41.51

## 2021-10-11 HISTORY — DX: Sepsis due to Escherichia coli (e. coli): R65.20

## 2021-10-11 HISTORY — DX: Unspecified multiple injuries, initial encounter: T07.XXXA

## 2021-10-11 HISTORY — DX: Chronic kidney disease, unspecified: N18.9

## 2021-10-11 HISTORY — DX: Sepsis due to Escherichia coli (e. coli): J96.01

## 2021-10-11 HISTORY — DX: Cardiac arrhythmia, unspecified: I49.9

## 2021-10-11 HISTORY — DX: Presence of cardiac pacemaker: Z95.0

## 2021-10-11 MED ORDER — FAMOTIDINE 20 MG PO TABS
20.0000 mg | ORAL_TABLET | Freq: Once | ORAL | Status: DC
  Filled 2021-10-11: qty 1

## 2021-10-11 NOTE — Pre-Procedure Instructions (Signed)
Pre admission appointment scheduled today, called placed to [facility] for Station 3 nurse with no response. This Probation officer has faxed over a copy of Instruction to station 3 nurse for patients procedure scheduled for 10/21/21. This Probation officer has obtained a copy pf recent MAR and medication and history was updated. This Probation officer placed a call with Mrs. [name] who voices that she is not patients HCPOA. I will continue to attempt to connect with nurse at [facility]. ?This Probation officer was able to contact [name], the care nurse at [facility] for the patient, she voices that the patient is alert and oriented and can answer for herself. Phone number given did not get me to patient but back to [employer]. [name] verified to this Probation officer that she did receive the instructions for the patient. I will continue to attempted to reach this patient. ?

## 2021-10-11 NOTE — Patient Instructions (Addendum)
Your procedure is scheduled on: 10/21/21 - Friday ?Report to the Registration Desk on the 1st floor of the [facility]. ?To find out your arrival time, please call [phone] between 1PM - 3PM on: 10/20/21 - Thursday ? ?REMEMBER: ?Instructions that are not followed completely may result in serious medical risk, up to and including death; or upon the discretion of your surgeon and anesthesiologist your surgery may need to be rescheduled. ? ?Do not eat food or drink any fluid after midnight the night before surgery.  ?No gum chewing, lozengers or hard candies. ? ? ?TAKE THESE MEDICATIONS THE MORNING OF SURGERY WITH A SIP OF WATER: NONE ? ?- insulin glargine-yfgn (SEMGLEE) 100 UNIT/ML injection - do not inject any insulin on the morning of surgery. ? ?One week prior to surgery: ?Stop Anti-inflammatories (NSAIDS) such as Advil, Aleve, Ibuprofen, Motrin, Naproxen, Naprosyn and Aspirin based products such as Excedrin, Goodys Powder, BC Powder. ? ?Stop beginning 10/14/21, ANY OVER THE COUNTER supplements until after surgery :Cholecalciferol, Multiple Vitamin . ? ?You may however, continue to take Tylenol if needed for pain up until the day of surgery. ? ?No Alcohol for 24 hours before or after surgery. ? ?No Smoking including e-cigarettes for 24 hours prior to surgery.  ?No chewable tobacco products for at least 6 hours prior to surgery.  ?No nicotine patches on the day of surgery. ? ?Do not use any "recreational" drugs for at least a week prior to your surgery.  ?Please be advised that the combination of cocaine and anesthesia may have negative outcomes, up to and including death. ?If you test positive for cocaine, your surgery will be cancelled. ? ?On the morning of surgery brush your teeth with toothpaste and water, you may rinse your mouth with mouthwash if you wish. ?Do not swallow any toothpaste or mouthwash. ? ?Do not wear jewelry, make-up, hairpins, clips or nail polish. ? ?Do not wear lotions, powders, or  perfumes.  ? ?Do not shave body from the neck down 48 hours prior to surgery just in case you cut yourself which could leave a site for infection.  ?Also, freshly shaved skin may become irritated if using the CHG soap. ? ?Contact lenses, hearing aids and dentures may not be worn into surgery. ? ?Do not bring valuables to the hospital. [facility] is not responsible for any missing/lost belongings or valuables.  ? ?Bring your C-PAP to the hospital with you in case you may have to spend the night.  ? ?Notify your doctor if there is any change in your medical condition (cold, fever, infection). ? ?Wear comfortable clothing (specific to your surgery type) to the hospital. ? ?After surgery, you can help prevent lung complications by doing breathing exercises.  ?Take deep breaths and cough every 1-2 hours. Your doctor may order a device called an Incentive Spirometer to help you take deep breaths. ?When coughing or sneezing, hold a pillow firmly against your incision with both hands. This is called ?splinting.? Doing this helps protect your incision. It also decreases belly discomfort. ? ?If you are being admitted to the hospital overnight, leave your suitcase in the car. ?After surgery it may be brought to your room. ? ?If you are being discharged the day of surgery, you will not be allowed to drive home. ?You will need a responsible adult (18 years or older) to drive you home and stay with you that night.  ? ?If you are taking public transportation, you will need to have a responsible adult (  18 years or older) with you. ?Please confirm with your physician that it is acceptable to use public transportation.  ? ?Please call the [facility] at [phone] if you have any questions about these instructions. ? ?Surgery Visitation Policy: ? ?Patients undergoing a surgery or procedure may have two family members or support persons with them as long as the person is not COVID-19 positive or experiencing its  symptoms.  ? ?Inpatient Visitation:   ? ?Visiting hours are 7 a.m. to 8 p.m. ?Up to four visitors are allowed at one time in a patient room, including children. The visitors may rotate out with other people during the day. One designated support person (adult) may remain overnight.  ?

## 2021-10-12 NOTE — Pre-Procedure Instructions (Signed)
Progress Notes ?- documented in this encounter ?[name], [place] - 08/04/2021 3:30 PM EST ?Formatting of this note is different from the original. ?Images from the original note were not included. ?Established Patient Visit  ? ?Chief Complaint: ?Chief Complaint  ?Patient presents with  ? 6 months follow up  ?Date of Service: 08/04/2021 ?Date of Birth: Aug 06, 1950 ?PCP: Melonie [place], MD  ?History of Present Illness: [name] is a 71 y.o.female patient with a past medical history of paroxysmal atrial fibrillation on Eliquis, severe aortic valve stenosis s/p TAVR 2019, complete heart block s/p dual-chamber pacer 2019, NSTEMI, hyperlipidemia, type 2 diabetes, stage IIIa chronic kidney disease, bilateral leg edema. Patient is here today for routine 63-month follow-up visit. She states that she has been doing generally well over the last 6 months with no new cardiovascular complaints or symptoms at this time currently denies any problems with chest pain, shortness of breath on exertion, weakness, fatigue, palpitations, syncope, presyncope. She has been having a little bit of increase in swelling in her legs, currently seeing wound care and has her legs wrapped. Wrap legs appear euvolemic at this time. She has had dose adjustment of her Lasix to alternating 40 mg and 60 mg every day. Patient states that this has helped her swelling a great deal. She follows with [facility] electrophysiology for management of her pacemaker with last interrogation on 07/14/2021 showing adequate battery life and normal function. She also recently had an echocardiogram on 06/06/2021 showing normal LV systolic function with EF 55-60%, thickened mitral valve leaflets with partial mobility, mitral annular calcification, normally functioning aortic valve replacement. Unchanged from previous echocardiogram in 2021. ? ?Past Medical and Surgical History  ?Past Medical History ?Past Medical History:  ?Diagnosis Date  ? Aortic valve stenosis, severe  ?  Diabetes (CMS-HCC)  ? Heart murmur, unspecified  ? HTN (hypertension)  ? Hyperlipidemia  ? ?Past Surgical History ?She has no past surgical history on file.  ? ?Medications and Allergies  ?Current Medications ? ?Current Outpatient Medications on File Prior to Visit  ?Medication Sig Dispense Refill  ? ACCU-CHEK FASTCLIX LANCET DRUM USE TO CHECK BLOOD SUGAR TWICE DAILY 4  ? atorvastatin (LIPITOR) 40 MG tablet Take 1 tablet by mouth once daily 90 tablet 3  ? cholecalciferol (VITAMIN D3) 2,000 unit capsule Take 2,000 Units by mouth once daily  ? CRANBERRY ORAL Take 15,000 mcg by mouth once daily  ? docusate (COLACE) 100 MG capsule Take 100 mg by mouth once daily  ? ELIQUIS 5 mg tablet TAKE 1 TABLET BY MOUTH EVERY 12 HOURS 180 tablet 1  ? fenofibrate nanocrystallized (TRICOR) 145 MG tablet Take 145 mg by mouth once daily  ? FUROsemide (LASIX) 40 MG tablet Take 1 tablet by mouth once daily (Patient taking differently: Alternate 40 mg with 60 mg every other day) 90 tablet 1  ? insulin GLARGINE (LANTUS SOLOSTAR, BASAGLAR KWIKPEN) pen injector (concentration 100 units/mL) Inject 70 Units subcutaneously nightly  ? metFORMIN (GLUCOPHAGE) 1000 MG tablet Take 1,000 mg by mouth once daily  ? NOVOLOG FLEXPEN U-100 INSULIN pen injector (concentration 100 units/mL) Inject 22 Units subcutaneously Daily after breakfast 6 units at breakfast and 8 units with lunch and dinner  ? metoprolol tartrate (LOPRESSOR) 25 MG tablet Take 1 tablet by mouth twice daily 180 tablet 3  ? potassium chloride (K-TAB) 20 mEq TbER ER tablet Take 20 mEq by mouth once daily Alternate 20 mg and 40 mg every othe rday  ? ?No current facility-administered medications on  file prior to visit.  ? ?Allergies: Ciprofloxacin, Bactrim [sulfamethoxazole-trimethoprim], Januvia [sitagliptin], Lisinopril, Niaspan extended-release [niacin], and Tetanus vaccines and toxoid ? ?Social and Family History  ?Social History ?reports that she has never smoked. She has never used  smokeless tobacco. She reports that she does not drink alcohol and does not use drugs. ? ?Family History ?Family History  ?Problem Relation Age of Onset  ? Cancer Mother  ? Heart failure Father  ? Myocardial Infarction (Heart attack) Brother  ? Diabetes Other  ? Gout Other  ? High blood pressure (Hypertension) Other  ? ?Review of Systems  ? ?Review of Systems  ?Positive for none ?Negative for weight gain weight loss, weakness, vision change, hearing loss, cough, congestion, PND, orthopnea, heartburn, nausea, diaphoresis, vomiting, diarrhea, bloody stool, melena, stomach pain, extremity pain, leg weakness, leg cramping, leg blood clots, headache, blackouts, nosebleed, trouble swallowing, mouth pain, urinary frequency, urination at night, muscle weakness, skin lesions, skin rashes, tingling ,ulcers, numbness, anxiety, and/or depression ?Physical Examination  ? ?Vitals:BP 124/72  Pulse 75  Ht 165.1 cm (5\' 5" )  Wt (!) 154.7 kg (341 lb)  SpO2 96%  BMI 56.75 kg/m?  ?Ht:165.1 cm (5\' 5" ) Wt:(!) 154.7 kg (341 lb) GUY:QIHK surface area is 2.66 meters squared. ?Body mass index is 56.75 kg/m?Marland Kitchen ?Appearance: well appearing in no acute distress ?HEENT: Pupils equally reactive to light and accomodation, no xanthalasma  ?Neck: Supple, no apparent thyromegaly, masses, or lymphadenopathy  ?Lungs: normal respiratory effort; no crackles, no rhonchi, no wheezes ?Heart: Regular rate and rhythm. Normal S1 S2 No gallops, murmur, no rub, PMI is normal size and placement. carotid upstroke normal without bruit. Jugular venous pressure is normal ?Abdomen: soft, nontender, not distended with normal bowel sounds. No apparent hepatosplenomegally. Abdominal aorta is normal size without bruit ?Extremities: 1+ edema, no ulcers, no clubbing, no cyanosis ?Peripheral Pulses: 2+ in upper extremities, 2+ femoral pulses bilaterally, 2+lower extremity  ?Musculoskeletal; Normal muscle tone without kyphosis ?Neurological: Oriented and Alert, Cranial  nerves intact ? ?Assessment  ? ?71 y.o. female with  ?Encounter Diagnoses  ?Name Primary?  ? Aortic valve stenosis, severe Yes  ? Complete heart block (CMS-HCC)  ? Paroxysmal A-fib (CMS-HCC)  ? Mixed hyperlipidemia  ? Type 2 diabetes mellitus with diabetic nephropathy, without long-term current use of insulin (CMS-HCC)  ? ?Plan  ? ?1. Paroxysmal atrial fibrillation: Patient is currently taking metoprolol for heart rate control and maintenance of normal sinus rhythm. Patient is anticoagulated on Eliquis 5 mg for stroke risk reduction. Patient is not currently having any symptoms of worsening palpitations or atrial fibrillation. Patient is currently well rate controlled at 75 bpm. ?-Continue metoprolol 25 mg twice daily ?-Continue Eliquis 5 mg twice daily for stroke risk reduction ?  ?2. Pacemaker interrogation: Patient has an implanted pacemaker secondary to complete heart block. most recent pacemaker interrogation that showed normal voltage of the battery and appropriate estimated longevity with normal lead function. No necessary reprogramming at this time. ?  ?3. Aortic valve prosthesis: Patient had TAVR in 2019 secondary to severe aortic stenosis. Patient has not had any complications after her procedure and is currently stable without cardiac murmur. Last echocardiogram on 05/2021 showed trivial aortic regurgitation, normal LV systolic function, normal RV systolic dysfunction. ?-Continue regular surveillance as symptoms indicate ?  ?4. Hypertension: Patient's blood pressure is stable in the office today at 124/72. Patient is currently taking a combination of Lasix, metoprolol for blood pressure management. ?-No medication changes at this time ?-Patient encouraged to eat a  low-sodium diet in order to further help treat her hypertension ?  ?5. Hyperlipidemia: Patient has a history of hyperlipidemia currently being treated with atorvastatin 40 mg. Patient is not currently having any side effects of the medication.  Last lipid panel on 05/13/2020 showed an LDL of 54. ?-No medication changes at this time. ?  ?6. Bilateral leg edema: Patient has a history of bilateral leg edema for which she takes Lasix alternating 40 and 60 m

## 2021-10-19 NOTE — Progress Notes (Signed)
?  Perioperative Services ?Pre-Admission/Anesthesia Testing ? ?  ? ?Date: 10/19/21 ? ?Name: [name] ?MRN:   [id number] ? ?Re: Surgical clearance ? ?Surgical clearance received from Dr. Derrick Ravel office (cardiology). Patient has been cleared for the planned CYSTOSCOPY/URETEROSCOPY/HOLMIUM LASER/STENT PLACEMENT procedure scheduled for 10/21/2021 with Dr. Nickolas Madrid, MD. cardiology notes that patient may proceed with an overall LOW risk stratification.  ? ?Copy of signed clearance form placed on patient's OR chart for review by the surgical/anesthetic team on the day of his procedure.  ? ?Patient has PPM in place.  Completed perioperative prescription for cardiac device management documentation completed by primary electrophysiology team and placed on patient's chart for review by the surgical/anesthetic team on the day of her procedure.  ? ?Honor Loh, MSN, APRN, FNP-C, CEN ?[facility]  ?Peri-operative Services Nurse Practitioner ?Phone: [phone] ?10/19/21 4:48 PM ? ?

## 2021-10-20 MED ORDER — CHLORHEXIDINE GLUCONATE 0.12 % MT SOLN
15.0000 mL | Freq: Once | OROMUCOSAL | Status: AC
  Administered 2021-10-21: 15 mL via OROMUCOSAL

## 2021-10-20 MED ORDER — ORAL CARE MOUTH RINSE: 15.0000 mL | Freq: Once | OROMUCOSAL | Status: AC

## 2021-10-20 MED ORDER — SODIUM CHLORIDE 0.9 % IV SOLN: INTRAVENOUS | Status: DC

## 2021-10-20 MED ORDER — FAMOTIDINE 20 MG PO TABS: 20.0000 mg | ORAL_TABLET | Freq: Once | ORAL | Status: AC

## 2021-10-20 MED ORDER — CEFAZOLIN SODIUM-DEXTROSE 2-4 GM/100ML-% IV SOLN
2.0000 g | INTRAVENOUS | Status: AC
  Administered 2021-10-21: 2 g via INTRAVENOUS

## 2021-10-21 ENCOUNTER — Encounter: Payer: Self-pay | Admitting: Urology

## 2021-10-21 ENCOUNTER — Encounter
Admission: RE | Disposition: A | Discharge status: Home / Self Care | Payer: Self-pay | Source: Home / Self Care | Attending: Urology

## 2021-10-21 ENCOUNTER — Ambulatory Visit
Admission: RE | Admit: 2021-10-21 | Discharge: 2021-10-21 | Disposition: A | Discharge status: Home / Self Care | Payer: Medicare Other | Attending: Urology | Admitting: Urology

## 2021-10-21 ENCOUNTER — Other Ambulatory Visit: Payer: Self-pay

## 2021-10-21 ENCOUNTER — Ambulatory Visit: Payer: Medicare Other | Admitting: Anesthesiology

## 2021-10-21 ENCOUNTER — Ambulatory Visit: Payer: Medicare Other

## 2021-10-21 DIAGNOSIS — E1122 Type 2 diabetes mellitus with diabetic chronic kidney disease: Secondary | ICD-10-CM | POA: Insufficient documentation

## 2021-10-21 DIAGNOSIS — Z6841 Body Mass Index (BMI) 40.0 and over, adult: Secondary | ICD-10-CM | POA: Diagnosis not present

## 2021-10-21 DIAGNOSIS — G4733 Obstructive sleep apnea (adult) (pediatric): Secondary | ICD-10-CM | POA: Diagnosis not present

## 2021-10-21 DIAGNOSIS — I2510 Atherosclerotic heart disease of native coronary artery without angina pectoris: Secondary | ICD-10-CM | POA: Diagnosis not present

## 2021-10-21 DIAGNOSIS — N189 Chronic kidney disease, unspecified: Secondary | ICD-10-CM | POA: Insufficient documentation

## 2021-10-21 DIAGNOSIS — N200 Calculus of kidney: Secondary | ICD-10-CM | POA: Diagnosis not present

## 2021-10-21 DIAGNOSIS — Z950 Presence of cardiac pacemaker: Secondary | ICD-10-CM | POA: Diagnosis not present

## 2021-10-21 DIAGNOSIS — I252 Old myocardial infarction: Secondary | ICD-10-CM | POA: Insufficient documentation

## 2021-10-21 DIAGNOSIS — I5032 Chronic diastolic (congestive) heart failure: Secondary | ICD-10-CM | POA: Insufficient documentation

## 2021-10-21 DIAGNOSIS — I130 Hypertensive heart and chronic kidney disease with heart failure and stage 1 through stage 4 chronic kidney disease, or unspecified chronic kidney disease: Secondary | ICD-10-CM | POA: Insufficient documentation

## 2021-10-21 DIAGNOSIS — N201 Calculus of ureter: Secondary | ICD-10-CM | POA: Insufficient documentation

## 2021-10-21 DIAGNOSIS — E785 Hyperlipidemia, unspecified: Secondary | ICD-10-CM | POA: Diagnosis not present

## 2021-10-21 HISTORY — PX: CYSTOSCOPY/URETEROSCOPY/HOLMIUM LASER/STENT PLACEMENT: SHX6546

## 2021-10-21 LAB — POCT I-STAT, CHEM 8
BUN: 58 mg/dL — ABNORMAL HIGH (ref 8–23)
Calcium, Ion: 1.13 mmol/L — ABNORMAL LOW (ref 1.15–1.40)
Chloride: 107 mmol/L (ref 98–111)
Creatinine, Ser: 1.7 mg/dL — ABNORMAL HIGH (ref 0.44–1.00)
Glucose, Bld: 189 mg/dL — ABNORMAL HIGH (ref 70–99)
HCT: 38 % (ref 36.0–46.0)
Hemoglobin: 12.9 g/dL (ref 12.0–15.0)
Potassium: 5.4 mmol/L — ABNORMAL HIGH (ref 3.5–5.1)
Sodium: 140 mmol/L (ref 135–145)
TCO2: 27 mmol/L (ref 22–32)

## 2021-10-21 LAB — GLUCOSE, CAPILLARY: Glucose-Capillary: 174 mg/dL — ABNORMAL HIGH (ref 70–99)

## 2021-10-21 SURGERY — CYSTOSCOPY/URETEROSCOPY/HOLMIUM LASER/STENT PLACEMENT
Anesthesia: General | Site: Ureter | Laterality: Bilateral

## 2021-10-21 MED ORDER — CIPROFLOXACIN IN D5W 400 MG/200ML IV SOLN
INTRAVENOUS | Status: AC
  Filled 2021-10-21: qty 200

## 2021-10-21 MED ORDER — SUCCINYLCHOLINE CHLORIDE 200 MG/10ML IV SOSY
PREFILLED_SYRINGE | INTRAVENOUS | Status: DC | PRN
  Administered 2021-10-21: 100 mg via INTRAVENOUS

## 2021-10-21 MED ORDER — PROPOFOL 10 MG/ML IV BOLUS
INTRAVENOUS | Status: DC | PRN
  Administered 2021-10-21: 100 mg via INTRAVENOUS

## 2021-10-21 MED ORDER — ONDANSETRON HCL 4 MG/2ML IJ SOLN: 4.0000 mg | Freq: Once | INTRAMUSCULAR | Status: DC | PRN

## 2021-10-21 MED ORDER — CIPROFLOXACIN IN D5W 400 MG/200ML IV SOLN
INTRAVENOUS | Status: DC | PRN
  Administered 2021-10-21: 400 mg via INTRAVENOUS

## 2021-10-21 MED ORDER — LIDOCAINE HCL (PF) 2 % IJ SOLN
INTRAMUSCULAR | Status: AC
  Filled 2021-10-21: qty 5

## 2021-10-21 MED ORDER — FAMOTIDINE 20 MG PO TABS
ORAL_TABLET | ORAL | Status: AC
  Administered 2021-10-21: 20 mg via ORAL
  Filled 2021-10-21: qty 1

## 2021-10-21 MED ORDER — FENTANYL CITRATE (PF) 100 MCG/2ML IJ SOLN
INTRAMUSCULAR | Status: AC
  Filled 2021-10-21: qty 2

## 2021-10-21 MED ORDER — PHENYLEPHRINE 80 MCG/ML (10ML) SYRINGE FOR IV PUSH (FOR BLOOD PRESSURE SUPPORT)
PREFILLED_SYRINGE | INTRAVENOUS | Status: AC
  Filled 2021-10-21: qty 10

## 2021-10-21 MED ORDER — ONDANSETRON HCL 4 MG/2ML IJ SOLN
INTRAMUSCULAR | Status: DC | PRN
  Administered 2021-10-21: 4 mg via INTRAVENOUS

## 2021-10-21 MED ORDER — ROCURONIUM BROMIDE 10 MG/ML (PF) SYRINGE
PREFILLED_SYRINGE | INTRAVENOUS | Status: AC
  Filled 2021-10-21: qty 10

## 2021-10-21 MED ORDER — ROCURONIUM BROMIDE 100 MG/10ML IV SOLN
INTRAVENOUS | Status: DC | PRN
  Administered 2021-10-21: 40 mg via INTRAVENOUS

## 2021-10-21 MED ORDER — CIPROFLOXACIN HCL 500 MG PO TABS: 500.0000 mg | ORAL_TABLET | Freq: Two times a day (BID) | ORAL | 0 refills | Status: AC

## 2021-10-21 MED ORDER — FENTANYL CITRATE (PF) 100 MCG/2ML IJ SOLN
INTRAMUSCULAR | Status: DC | PRN
  Administered 2021-10-21: 25 ug via INTRAVENOUS
  Administered 2021-10-21: 50 ug via INTRAVENOUS
  Administered 2021-10-21: 25 ug via INTRAVENOUS

## 2021-10-21 MED ORDER — ACETAMINOPHEN 10 MG/ML IV SOLN
INTRAVENOUS | Status: AC
  Filled 2021-10-21: qty 200

## 2021-10-21 MED ORDER — PROPOFOL 10 MG/ML IV BOLUS
INTRAVENOUS | Status: AC
  Filled 2021-10-21: qty 20

## 2021-10-21 MED ORDER — EPHEDRINE 5 MG/ML INJ
INTRAVENOUS | Status: AC
  Filled 2021-10-21: qty 5

## 2021-10-21 MED ORDER — LIDOCAINE HCL (CARDIAC) PF 100 MG/5ML IV SOSY
PREFILLED_SYRINGE | INTRAVENOUS | Status: DC | PRN
  Administered 2021-10-21: 50 mg via INTRAVENOUS

## 2021-10-21 MED ORDER — ACETAMINOPHEN 10 MG/ML IV SOLN
INTRAVENOUS | Status: DC | PRN
  Administered 2021-10-21: 1000 mg via INTRAVENOUS

## 2021-10-21 MED ORDER — FENTANYL CITRATE (PF) 100 MCG/2ML IJ SOLN: 25.0000 ug | INTRAMUSCULAR | Status: DC | PRN

## 2021-10-21 MED ORDER — SUGAMMADEX SODIUM 500 MG/5ML IV SOLN
INTRAVENOUS | Status: AC
  Filled 2021-10-21: qty 5

## 2021-10-21 MED ORDER — SUCCINYLCHOLINE CHLORIDE 200 MG/10ML IV SOSY
PREFILLED_SYRINGE | INTRAVENOUS | Status: AC
  Filled 2021-10-21: qty 10

## 2021-10-21 MED ORDER — SUGAMMADEX SODIUM 500 MG/5ML IV SOLN
INTRAVENOUS | Status: DC | PRN
  Administered 2021-10-21: 300 mg via INTRAVENOUS

## 2021-10-21 MED ORDER — CEFAZOLIN SODIUM-DEXTROSE 2-4 GM/100ML-% IV SOLN
INTRAVENOUS | Status: AC
  Filled 2021-10-21: qty 100

## 2021-10-21 MED ORDER — ONDANSETRON HCL 4 MG/2ML IJ SOLN
INTRAMUSCULAR | Status: AC
  Filled 2021-10-21: qty 2

## 2021-10-21 MED ORDER — EPHEDRINE SULFATE (PRESSORS) 50 MG/ML IJ SOLN
INTRAMUSCULAR | Status: DC | PRN
  Administered 2021-10-21: 5 mg via INTRAVENOUS
  Administered 2021-10-21: 10 mg via INTRAVENOUS

## 2021-10-21 MED ORDER — DEXAMETHASONE SODIUM PHOSPHATE 10 MG/ML IJ SOLN
INTRAMUSCULAR | Status: AC
  Filled 2021-10-21: qty 1

## 2021-10-21 MED ORDER — IOHEXOL 180 MG/ML  SOLN
INTRAMUSCULAR | Status: DC | PRN
  Administered 2021-10-21: 15 mL

## 2021-10-21 MED ORDER — KETOROLAC TROMETHAMINE 30 MG/ML IJ SOLN
INTRAMUSCULAR | Status: AC
  Filled 2021-10-21: qty 1

## 2021-10-21 MED ORDER — PHENYLEPHRINE HCL (PRESSORS) 10 MG/ML IV SOLN
INTRAVENOUS | Status: DC | PRN
  Administered 2021-10-21: 80 ug via INTRAVENOUS

## 2021-10-21 MED ORDER — SODIUM CHLORIDE 0.9 % IR SOLN
Status: DC | PRN
  Administered 2021-10-21: 1500 mL

## 2021-10-21 SURGICAL SUPPLY — 29 items
ADHESIVE MASTISOL STRL (MISCELLANEOUS) ×1 IMPLANT
BAG DRAIN CYSTO-URO LG1000N (MISCELLANEOUS) ×2 IMPLANT
BRUSH SCRUB EZ 1% IODOPHOR (MISCELLANEOUS) ×2 IMPLANT
CATH URET FLEX-TIP 2 LUMEN 10F (CATHETERS) IMPLANT
CATH URETL OPEN 5X70 (CATHETERS) IMPLANT
CNTNR SPEC 2.5X3XGRAD LEK (MISCELLANEOUS)
CONT SPEC 4OZ STER OR WHT (MISCELLANEOUS)
CONTAINER SPEC 2.5X3XGRAD LEK (MISCELLANEOUS) IMPLANT
DRAPE UTILITY 15X26 TOWEL STRL (DRAPES) ×2 IMPLANT
DRSG TEGADERM 2-3/8X2-3/4 SM (GAUZE/BANDAGES/DRESSINGS) ×1 IMPLANT
GLOVE SURG UNDER POLY LF SZ7.5 (GLOVE) ×2 IMPLANT
GOWN STRL REUS W/ TWL LRG LVL3 (GOWN DISPOSABLE) ×1 IMPLANT
GOWN STRL REUS W/ TWL XL LVL3 (GOWN DISPOSABLE) ×1 IMPLANT
GOWN STRL REUS W/TWL LRG LVL3 (GOWN DISPOSABLE) ×1
GOWN STRL REUS W/TWL XL LVL3 (GOWN DISPOSABLE) ×1
GUIDEWIRE STR DUAL SENSOR (WIRE) ×2 IMPLANT
INFUSOR MANOMETER BAG 3000ML (MISCELLANEOUS) ×1 IMPLANT
IV NS IRRIG 3000ML ARTHROMATIC (IV SOLUTION) ×2 IMPLANT
KIT TURNOVER CYSTO (KITS) ×2 IMPLANT
PACK CYSTO AR (MISCELLANEOUS) ×2 IMPLANT
SET CYSTO W/LG BORE CLAMP LF (SET/KITS/TRAYS/PACK) ×2 IMPLANT
SHEATH URETERAL 12FRX35CM (MISCELLANEOUS) IMPLANT
STENT URET 6FRX24 CONTOUR (STENTS) IMPLANT
STENT URET 6FRX26 CONTOUR (STENTS) ×2 IMPLANT
SURGILUBE 2OZ TUBE FLIPTOP (MISCELLANEOUS) ×2 IMPLANT
SYR 10ML LL (SYRINGE) ×2 IMPLANT
TRACTIP FLEXIVA PULSE ID 200 (Laser) ×1 IMPLANT
VALVE UROSEAL ADJ ENDO (VALVE) ×1 IMPLANT
WATER STERILE IRR 500ML POUR (IV SOLUTION) ×2 IMPLANT

## 2021-10-21 NOTE — Discharge Instructions (Addendum)

## 2021-10-21 NOTE — Anesthesia Preprocedure Evaluation (Addendum)
Anesthesia Evaluation  ?Patient identified by MRN, date of birth, ID band ?Patient awake ? ? ? ?Reviewed: ?Allergy & Precautions, NPO status , Patient's Chart, lab work & pertinent test results ? ?Airway ?Mallampati: III ? ?TM Distance: >3 FB ?Neck ROM: full ? ?Mouth opening: Limited Mouth Opening ? Dental ? ?(+) Edentulous Upper ?  ?Pulmonary ?neg pulmonary ROS, sleep apnea and Continuous Positive Airway Pressure Ventilation ,  ?  ?Pulmonary exam normal ?breath sounds clear to auscultation ? ? ? ? ? ? Cardiovascular ?Exercise Tolerance: Poor ?hypertension, Pt. on medications ?+ CAD, +CHF and + Orthopnea  ?negative cardio ROS ?Normal cardiovascular exam+ pacemaker  ?Rhythm:Regular  ? ?  ?Neuro/Psych ?negative neurological ROS ? negative psych ROS  ? GI/Hepatic ?negative GI ROS, Neg liver ROS,   ?Endo/Other  ?negative endocrine ROSdiabetes, Well ControlledMorbid obesity ? Renal/GU ?ARFRenal disease  ? ?  ?Musculoskeletal ?negative musculoskeletal ROS ?(+)  ? Abdominal ?(+) + obese,   ?Peds ?negative pediatric ROS ?(+)  Hematology ?negative hematology ROS ?(+)   ?Anesthesia Other Findings ?Past Medical History: ?No date: Acute kidney failure ([place]) ?09/2021: Acute respiratory failure with hypoxia ([place]) ?No date: Bacteremia ?No date: CAD (coronary artery disease) ?No date: Chronic kidney disease ?No date: Diabetes mellitus without complication ([place]) ?No date: Diastolic CHF ([place]) ?No date: Dysphagia ?No date: Dysrhythmia ?No date: History of kidney stones ?No date: Hyperlipemia ?No date: Hypertension ?No date: Lymphedema ?No date: MI (myocardial infarction) ([place]) ?No date: Morbid obesity ([place]) ?No date: OSA (obstructive sleep apnea) ?No date: Presence of permanent cardiac pacemaker ?No date: Sepsis due to Escherichia coli with acute hypoxic  ?respiratory failure ([place]) ?No date: Severe aortic stenosis ?No date: Wounds, multiple ? ?Past Surgical History: ?2019: CARDIAC SURGERY ?     Comment:  TAVR ?08/25/2021: CYSTOSCOPY W/ URETERAL STENT PLACEMENT ?    Comment:  Procedure: CYSTOSCOPY WITH RETROGRADE PYELOGRAM/URETERAL ?             STENT PLACEMENT;  Surgeon: Billey Co, MD;   ?             Location: [facility] ORS;  Service: Urology;; ?No date: none ?No date: PACEMAKER INSERTION ? ?BMI   ? Body Mass Index: 51.09 kg/m?  ?  ? ? Reproductive/Obstetrics ?negative OB ROS ? ?  ? ? ? ? ? ? ? ? ? ? ? ? ? ?  ?  ? ? ? ? ? ? ? ? ?Anesthesia Physical ?Anesthesia Plan ? ?ASA: 4 ? ?Anesthesia Plan: General  ? ?Post-op Pain Management:   ? ?Induction: Intravenous ? ?PONV Risk Score and Plan: Ondansetron, Dexamethasone, Midazolam and Treatment may vary due to age or medical condition ? ?Airway Management Planned: Oral ETT ? ?Additional Equipment:  ? ?Intra-op Plan:  ? ?Post-operative Plan: Extubation in OR ? ?Informed Consent: I have reviewed the patients History and Physical, chart, labs and discussed the procedure including the risks, benefits and alternatives for the proposed anesthesia with the patient or authorized representative who has indicated his/her understanding and acceptance.  ? ? ? ?Dental Advisory Given ? ?Plan Discussed with: CRNA and Surgeon ? ?Anesthesia Plan Comments:   ? ? ? ? ? ? ?Anesthesia Quick Evaluation ? ?

## 2021-10-21 NOTE — H&P (Signed)
? ?  10/21/21 ?1:15 PM  ? ?[name] ?July 12, 1950 ?[id number] ? ?CC: Nephrolithiasis ? ?HPI: ?Extremely comorbid 71 year old female with morbid obesity who presented to the ER with altered mental status and right-sided flank pain on 08/25/2021 and a CT showed a right proximal ureteral stone with hydronephrosis, left renal stone just above the UPJ, and clinical picture was consistent with sepsis from urinary source.  She underwent emergent bilateral ureteral stent placement, and had a prolonged recovery with extended ICU stay, need for temporary dialysis, and prolonged intubation. ? ?She has had extended stay in rehab, and at this point feels she has recovered enough for definitive management with ureteroscopy and laser lithotripsy, to prevent recurrent infections with bilateral stents in place. ?  ? ? ?PMH: ?Past Medical History:  ?Diagnosis Date  ? Acute kidney failure ([place])   ? Acute respiratory failure with hypoxia ([place]) 09/2021  ? Bacteremia   ? CAD (coronary artery disease)   ? Chronic kidney disease   ? Diabetes mellitus without complication ([place])   ? Diastolic CHF ([place])   ? Dysphagia   ? Dysrhythmia   ? History of kidney stones   ? Hyperlipemia   ? Hypertension   ? Lymphedema   ? MI (myocardial infarction) ([place])   ? Morbid obesity ([place])   ? OSA (obstructive sleep apnea)   ? Presence of permanent cardiac pacemaker   ? Sepsis due to Escherichia coli with acute hypoxic respiratory failure ([place])   ? Severe aortic stenosis   ? Wounds, multiple   ? ? ?Surgical History: ?Past Surgical History:  ?Procedure Laterality Date  ? CARDIAC SURGERY  2019  ? TAVR  ? CYSTOSCOPY W/ URETERAL STENT PLACEMENT  08/25/2021  ? Procedure: CYSTOSCOPY WITH RETROGRADE PYELOGRAM/URETERAL STENT PLACEMENT;  Surgeon: Billey Co, MD;  Location: [facility] ORS;  Service: Urology;;  ? none    ? PACEMAKER INSERTION    ? ? ? ?Family History: ?Family History  ?Problem Relation Age of Onset  ? Cancer Mother   ? Heart failure Father   ? ? ?Social  History:  reports that she has never smoked. She has never used smokeless tobacco. She reports that she does not drink alcohol and does not use drugs. ? ?Physical Exam: ? ?Constitutional:  Alert and oriented, No acute distress. ?Cardiovascular: Regular rate and rhythm ?Respiratory: Clear to auscultation bilaterally ?GI: Abdomen is soft, nontender, nondistended, no abdominal masses ? ? ?Laboratory Data: ?Urine culture 09/30/2021 with Enterococcus, treated with culture appropriate antibiotics ? ? ?Assessment & Plan:   ?Extremely comorbid 71 year old female who previously presented with a right proximal ureteral stone, sepsis from urinary source, as well as a left UPJ stone and underwent bilateral ureteral stent placement.  She has had a prolonged recovery.  She presents today for definitive management with bilateral ureteroscopy. ? ?We specifically discussed the risks ureteroscopy including bleeding, infection/sepsis, stent related symptoms including flank pain/urgency/frequency/incontinence/dysuria, ureteral injury, inability to access stone, or need for staged or additional procedures. ? ?Bilateral ureteroscopy, laser lithotripsy, stent placement today ? ?Nickolas Madrid, MD ?10/21/2021 ? ?[facility] ?[address] ?[place], [place] [address] ?[phone] ? ? ?

## 2021-10-21 NOTE — Transfer of Care (Signed)
Immediate Anesthesia Transfer of Care Note ? ?Patient: [name] ? ?Procedure(s) Performed: CYSTOSCOPY/URETEROSCOPY/HOLMIUM LASER/STENT PLACEMENT (Bilateral: Ureter) ? ?Patient Location: PACU ? ?Anesthesia Type:General ? ?Level of Consciousness: drowsy ? ?Airway & Oxygen Therapy: Patient Spontanous Breathing and Patient connected to face mask oxygen ? ?Post-op Assessment: Report given to RN ? ?Post vital signs: stable ? ?Last Vitals:  ?Vitals Value Taken Time  ?BP    ?Temp    ?Pulse 63 10/21/21 1506  ?Resp 28 10/21/21 1506  ?SpO2 99 % 10/21/21 1506  ?Vitals shown include unvalidated device data. ? ?Last Pain:  ?Vitals:  ? 10/21/21 1328  ?TempSrc: Temporal  ?PainSc: 0-No pain  ?   ? ?  ? ?Complications: No notable events documented. ?

## 2021-10-21 NOTE — Op Note (Signed)
Date of procedure: 10/21/21 ? ?Preoperative diagnosis:  ?Right proximal ureteral stones ?Left UPJ stone ? ?Postoperative diagnosis:  ?Left UPJ stone ? ?Procedure: ?Right diagnostic ureteroscopy ?Left ureteroscopy, laser lithotripsy ?Cystoscopy bilateral ureteral stent removal and stent replacement ?Bilateral retrograde pyelograms with intraoperative interpretation ? ?Surgeon: Nickolas Madrid, MD ? ?Anesthesia: General ? ?Complications: None ? ?Intraoperative findings:  ?Right ureteral stent had migrated into the bladder, both stents removed ?Right diagnostic ureteroscopy with no evidence of ureteral or renal stones, calcification seen on fluoroscopy near upper pole, but unable to identify any stones within the collecting system ?Left ureteroscopy and laser lithotripsy of left renal stones ?Bilateral ureteral stent placement with Dangler's ? ?EBL: Minimal ? ?Specimens: None ? ?Drains: Bilateral 6 French by 26cm ureteral stent ? ?Indication: [name] is a 71 y.o. patient who previously presented with sepsis from urinary source and bilateral UPJ stones and underwent emergent stent placement.  After reviewing the management options for treatment, they elected to proceed with the above surgical procedure(s). We have discussed the potential benefits and risks of the procedure, side effects of the proposed treatment, the likelihood of the patient achieving the goals of the procedure, and any potential problems that might occur during the procedure or recuperation. Informed consent has been obtained. ? ?Description of procedure: ? ?The patient was taken to the operating room and general anesthesia was induced. SCDs were placed for DVT prophylaxis. The patient was placed in the dorsal lithotomy position, prepped and draped in the usual sterile fashion, and preoperative antibiotics(Ancef and Cipro) were administered. A preoperative time-out was performed.  ? ?A 21 French rigid cystoscope was used to intubate the urethra  and thorough cystoscopy was performed.  The right ureteral stent had actually migrated into the bladder, and this was removed.  A sensor wire advanced easily up into the right kidney under fluoroscopic vision, and a single channel digital flexible ureteroscope advanced easily over the wire.  Thorough pyeloscopy revealed no evidence of any stones in the ureter or renal pelvis.  On fluoroscopy there was a calcification over the upper pole, but unable to identify any stones within the collecting system.  A retrograde pyelogram was performed from the proximal ureter and showed no filling defects or extravasation, and the calcification near the upper pole appeared to not be within the collecting system.  Careful pullback ureteroscopy showed no ureteral stones or abnormalities. ? ?The left ureteral stent was grasped and pulled to the meatus, and a sensor wire passed through the stent up to the left kidney under fluoroscopic vision.  A single channel digital flexible ureteroscope advanced easily over the wire up to the kidney and thorough pyeloscopy was performed.  This showed a large 1 cm stone in the renal pelvis, as well as a smaller stone in the lower pole.  A 200 ?m laser fiber on settings of 0.5 J and 80 Hz was used to methodically dust the stones to <37mm fragments.  Thorough pyeloscopy revealed no residual stone fragments.  A retrograde pyelogram was performed from the proximal ureter and showed no extravasation or filling defects.  Careful pullback ureteroscopy showed no residual stones or ureteral injury. ? ?The cystoscope was reinserted and a sensor wire passed back into the left kidney under fluoroscopic vision, and a 6 [place] by 26 cm ureteral stent was uneventfully placed with an excellent curl in the upper pole, as well as in the bladder. ? ?It appeared the contrast drained well from the right side, but with her comorbidities and  history of recurrent infections, I opted to place a right-sided stent as well.   A sensor wire advanced up to the kidney under fluoroscopic vision, and a 6 [place] by 26 cm ureteral stent was uneventfully placed with a curl in the upper pole as well as in the bladder.  The bladder was drained, and the Dangler's for both stents were secured to the suprapubic region with Mastisol and Tegaderm. ? ?Disposition: Stable to PACU ? ?Plan: ?Remove stents at facility on 10/26/2021 ?Cipro 500 mg twice daily x5 days with stents in place ?Follow-up in clinic in 6 months with [facility] prior ? ?Nickolas Madrid, MD ? ?

## 2021-10-21 NOTE — Anesthesia Procedure Notes (Addendum)
Procedure Name: Intubation ?Date/Time: 10/21/2021 2:11 PM ?Performed by: Debe Coder, CRNA ?Pre-anesthesia Checklist: Patient identified, Emergency Drugs available, Suction available and Patient being monitored ?Patient Re-evaluated:Patient Re-evaluated prior to induction ?Oxygen Delivery Method: Circle system utilized ?Preoxygenation: Pre-oxygenation with 100% oxygen ?Induction Type: IV induction ?Ventilation: Mask ventilation without difficulty ?Laryngoscope Size: McGraph and 3 ?Grade View: Grade I ?Tube type: Oral ?Tube size: 7.0 mm ?Number of attempts: 1 ?Airway Equipment and Method: Stylet and Oral airway ?Placement Confirmation: ETT inserted through vocal cords under direct vision, positive ETCO2 and breath sounds checked- equal and bilateral ?Secured at: 21 cm ?Tube secured with: Tape ?Dental Injury: Teeth and Oropharynx as per pre-operative assessment  ? ? ? ? ?

## 2021-10-23 ENCOUNTER — Encounter: Payer: Self-pay | Admitting: Urology

## 2021-10-25 ENCOUNTER — Other Ambulatory Visit: Payer: Self-pay

## 2021-10-25 DIAGNOSIS — N200 Calculus of kidney: Secondary | ICD-10-CM

## 2021-10-26 ENCOUNTER — Other Ambulatory Visit: Payer: Medicare Other

## 2021-10-26 NOTE — Anesthesia Postprocedure Evaluation (Signed)
Anesthesia Post Note ? ?Patient: Kierra Jezewski Merkey ? ?Procedure(s) Performed: CYSTOSCOPY/URETEROSCOPY/HOLMIUM LASER/STENT PLACEMENT (Bilateral: Ureter) ? ?Patient location during evaluation: PACU ?Anesthesia Type: General ?Level of consciousness: awake and awake and alert ?Pain management: pain level controlled ?Vital Signs Assessment: post-procedure vital signs reviewed and stable ?Respiratory status: spontaneous breathing ?Cardiovascular status: stable ?Anesthetic complications: no ? ? ?No notable events documented. ? ? ?Last Vitals:  ?Vitals:  ? 10/21/21 1635 10/21/21 1726  ?BP: (!) 157/71 (!) 148/48  ?Pulse: 66 60  ?Resp: 20 16  ?Temp: 36.4 ?C (!) 36.3 ?C  ?SpO2: 97%   ?  ?Last Pain:  ?Vitals:  ? 10/21/21 1635  ?TempSrc: Temporal  ?PainSc: 0-No pain  ? ? ?  ?  ?  ?  ?  ?  ? ?VAN STAVEREN,Denine Brotz ? ? ? ? ?

## 2022-02-14 ENCOUNTER — Encounter: Payer: Self-pay | Admitting: Emergency Medicine

## 2022-02-14 ENCOUNTER — Other Ambulatory Visit: Payer: Self-pay

## 2022-02-14 ENCOUNTER — Emergency Department
Admission: EM | Admit: 2022-02-14 | Discharge: 2022-02-14 | Disposition: A | Discharge status: Home / Self Care | Payer: Medicare Other | Attending: Emergency Medicine | Admitting: Emergency Medicine

## 2022-02-14 DIAGNOSIS — E1122 Type 2 diabetes mellitus with diabetic chronic kidney disease: Secondary | ICD-10-CM | POA: Diagnosis not present

## 2022-02-14 DIAGNOSIS — E1165 Type 2 diabetes mellitus with hyperglycemia: Secondary | ICD-10-CM | POA: Insufficient documentation

## 2022-02-14 DIAGNOSIS — I2510 Atherosclerotic heart disease of native coronary artery without angina pectoris: Secondary | ICD-10-CM | POA: Insufficient documentation

## 2022-02-14 DIAGNOSIS — I129 Hypertensive chronic kidney disease with stage 1 through stage 4 chronic kidney disease, or unspecified chronic kidney disease: Secondary | ICD-10-CM | POA: Diagnosis not present

## 2022-02-14 DIAGNOSIS — K0889 Other specified disorders of teeth and supporting structures: Secondary | ICD-10-CM | POA: Insufficient documentation

## 2022-02-14 DIAGNOSIS — N189 Chronic kidney disease, unspecified: Secondary | ICD-10-CM | POA: Diagnosis not present

## 2022-02-14 LAB — BASIC METABOLIC PANEL
Anion gap: 8 (ref 5–15)
BUN: 37 mg/dL — ABNORMAL HIGH (ref 8–23)
CO2: 26 mmol/L (ref 22–32)
Calcium: 8.8 mg/dL — ABNORMAL LOW (ref 8.9–10.3)
Chloride: 107 mmol/L (ref 98–111)
Creatinine, Ser: 1.65 mg/dL — ABNORMAL HIGH (ref 0.44–1.00)
GFR, Estimated: 33 mL/min — ABNORMAL LOW (ref >60)
Glucose, Bld: 188 mg/dL — ABNORMAL HIGH (ref 70–99)
Potassium: 3.6 mmol/L (ref 3.5–5.1)
Sodium: 141 mmol/L (ref 135–145)

## 2022-02-14 LAB — CBC WITH DIFFERENTIAL/PLATELET
Abs Immature Granulocytes: 0.02 10*3/uL (ref 0.00–0.07)
Basophils Absolute: 0 10*3/uL (ref 0.0–0.1)
Basophils Relative: 0 %
Eosinophils Absolute: 0.4 10*3/uL (ref 0.0–0.5)
Eosinophils Relative: 5 %
HCT: 42.8 % (ref 36.0–46.0)
Hemoglobin: 12.9 g/dL (ref 12.0–15.0)
Immature Granulocytes: 0 %
Lymphocytes Relative: 5 %
Lymphs Abs: 0.4 10*3/uL — ABNORMAL LOW (ref 0.7–4.0)
MCH: 28.2 pg (ref 26.0–34.0)
MCHC: 30.1 g/dL (ref 30.0–36.0)
MCV: 93.4 fL (ref 80.0–100.0)
Monocytes Absolute: 0.3 10*3/uL (ref 0.1–1.0)
Monocytes Relative: 5 %
Neutro Abs: 5.6 10*3/uL (ref 1.7–7.7)
Neutrophils Relative %: 85 %
Platelets: 224 10*3/uL (ref 150–400)
RBC: 4.58 MIL/uL (ref 3.87–5.11)
RDW: 14.6 % (ref 11.5–15.5)
WBC: 6.6 10*3/uL (ref 4.0–10.5)
nRBC: 0 % (ref 0.0–0.2)

## 2022-02-14 LAB — CBG MONITORING, ED: Glucose-Capillary: 208 mg/dL — ABNORMAL HIGH (ref 70–99)

## 2022-02-14 MED ORDER — AMOXICILLIN-POT CLAVULANATE 875-125 MG PO TABS: 1.0000 | ORAL_TABLET | Freq: Two times a day (BID) | ORAL | 0 refills | Status: AC

## 2022-02-14 MED ORDER — AMOXICILLIN-POT CLAVULANATE 875-125 MG PO TABS: 1.0000 | ORAL_TABLET | Freq: Two times a day (BID) | ORAL | 0 refills | Status: DC

## 2022-02-14 MED ORDER — AMOXICILLIN-POT CLAVULANATE 875-125 MG PO TABS
1.0000 | ORAL_TABLET | Freq: Once | ORAL | Status: AC
  Administered 2022-02-14: 1 via ORAL
  Filled 2022-02-14: qty 1

## 2022-02-14 NOTE — ED Provider Notes (Signed)
[facility] Provider Note    Event Date/Time   First MD Initiated Contact with Patient 02/14/22 1208     (approximate)   History   Abscess   HPI  [name] is a 71 y.o. female   Past medical history of CAD, CKD, diabetes, hypertension, hyperlipidemia, obesity, OSA chronic leg wounds presents with dental pain.  She comes from a nursing facility.  He states that the dental pain is on her upper left incisor and has been progressively worsening over the last several days.  No fever.  No facial pain, no neck stiffness, and no difficulty tolerating secretions or airway.  Note her facility once her legs evaluated for chronic wounds and weeping, as well as her shingles rash on her neck which has been improving.   History was obtained via patient and review of external medical charts.      Physical Exam   Triage Vital Signs: ED Triage Vitals  Enc Vitals Group     BP 02/14/22 1203 (!) 145/75     Pulse Rate 02/14/22 1203 84     Resp 02/14/22 1203 18     Temp 02/14/22 1205 97.7 F (36.5 C)     Temp Source 02/14/22 1205 Oral     SpO2 02/14/22 1202 95 %     Weight 02/14/22 1204 (!) 333 lb 8.9 oz (151.3 kg)     Height 02/14/22 1204 5\' 5"  (1.651 m)     Head Circumference --      [place] Flow --      Pain Score 02/14/22 1204 1     Pain Loc --      Pain Edu? --      Excl. in [place]? --     Most recent vital signs: Vitals:   02/14/22 1530 02/14/22 1600  BP: (!) 147/69 (!) 144/74  Pulse: (!) 59 (!) 54  Resp: 19 (!) 22  Temp:    SpO2: 91% 98%    General: Awake, no distress.  CV:  Good peripheral perfusion.  Resp:  Normal effort.  Clear to auscultation Abd:  No distention.  Nontender Other:    The rash to left side neck appears dried, healing. There are some chronic appearing lymphedema changes to bilateral lower extremities weeping but no signs of cellulitis, no pain.  Fractured tooth chronic no signs of infection, no dental abscess, no signs  of neck or submandibular infection or facial infection.    ED Results / Procedures / Treatments   Labs (all labs ordered are listed, but only abnormal results are displayed) Labs Reviewed  CBC WITH DIFFERENTIAL/PLATELET - Abnormal; Notable for the following components:      Result Value   Lymphs Abs 0.4 (*)    All other components within normal limits  BASIC METABOLIC PANEL - Abnormal; Notable for the following components:   Glucose, Bld 188 (*)    BUN 37 (*)    Creatinine, Ser 1.65 (*)    Calcium 8.8 (*)    GFR, Estimated 33 (*)    All other components within normal limits  CBG MONITORING, ED - Abnormal; Notable for the following components:   Glucose-Capillary 208 (*)    All other components within normal limits     I reviewed labs and they are notable for negative leukocytosis.    PROCEDURES:  Critical Care performed: No  Procedures   MEDICATIONS ORDERED IN ED: Medications  amoxicillin-clavulanate (AUGMENTIN) 875-125 MG per tablet 1 tablet (1 tablet Oral  Given 02/14/22 1403)     IMPRESSION / MDM / ASSESSMENT AND PLAN / ED COURSE  I reviewed the triage vital signs and the nursing notes.                              Differential diagnosis includes, but is not limited to, dental infection, facial infection, Ludwig's, dental caries, fractured tooth.  MDM: Patient with dental pain and no signs of infection on my exam.  Containing airway and secretions, no signs of intraoral abscess, no neck stiffness, no signs of Ludwick's.  There may be an element of early infection so I started her on Augmentin antibiotics and referral to dentistry.  Terms of her other chronic conditions that her nursing facility wanted evaluated, her neck rash shingles rash appears to be healing appropriately and there are no signs of infections to her bilateral legs which appear to be chronic lymphedema changes.  Dispo: After careful consideration of this patient's presentation, medical and social  risk factors, and evaluation in the emergency department I engaged in shared decision making with the patient and/or their representative to consider admission or observation and this patient was ultimately discharged because she is in stable condition with dental pain and plan for outpatient antibiotics and dentistry follow-up..   Patient's presentation is most consistent with acute presentation with potential threat to life or bodily function.       FINAL CLINICAL IMPRESSION(S) / ED DIAGNOSES   Final diagnoses:  Pain, dental     Rx / DC Orders   ED Discharge Orders          Ordered    amoxicillin-clavulanate (AUGMENTIN) 875-125 MG tablet  2 times daily        02/14/22 1432             Note:  This document was prepared using Dragon voice recognition software and may include unintentional dictation errors.    Lucillie Garfinkel, MD 02/14/22 [phone]

## 2022-02-14 NOTE — Discharge Instructions (Signed)
Take antibiotics prescribed. Take Tylenol 650 mg and ibuprofen 400 mg every 6 hours with food for pain. Call a dentist for an appointment next week. If you develop swelling to the face, difficulty swallowing, difficulty breathing, neck stiffness, high fever, or severe pain come back to the emergency department.  In terms of your legs, continue local wound care there does not appear to be an infection at this time. In terms of your shingles, they appear to be healing -follow-up with your doctor for routine checkup.  You experience any new or worsening symptoms call your doctor right away or return to the emergency department for recheck.

## 2022-02-14 NOTE — ED Notes (Signed)
[facility] called by [facility] to transport Pt back to facility.

## 2022-02-14 NOTE — ED Notes (Signed)
Attempted to call [facility] for a 3rd time w/o answer.

## 2022-02-14 NOTE — ED Notes (Signed)
Attempted to call reports to [facility] [place] w/o answer.  Will attempt again shortly.

## 2022-02-14 NOTE — ED Notes (Signed)
Repeat BMP sent to lab

## 2022-02-14 NOTE — ED Notes (Signed)
Report called given to facility and transport.

## 2022-02-14 NOTE — ED Notes (Signed)
Attempted to call report to [facility] w/o answer.  Tried to contact 2 different "halls."

## 2022-02-14 NOTE — ED Triage Notes (Signed)
Patient to ED via [facility] from [facility] for a dental abscess. Patient also has weeping in both legs and shingles on her chest that the facility would also like evaluated while in ED.

## 2022-02-14 NOTE — ED Notes (Signed)
Pt reports legs got into bad shape while at [facility].  Sts "they wouldn't listen."  Sts they have started looking better since being moved to [facility] in July.

## 2022-02-14 NOTE — ED Notes (Signed)
[facility] called for transport back to [place]

## 2022-02-25 ENCOUNTER — Other Ambulatory Visit: Payer: Self-pay

## 2022-02-25 ENCOUNTER — Emergency Department
Admission: EM | Admit: 2022-02-25 | Discharge: 2022-02-25 | Disposition: A | Discharge status: Home / Self Care | Payer: Medicare Other | Attending: Emergency Medicine | Admitting: Emergency Medicine

## 2022-02-25 ENCOUNTER — Emergency Department: Payer: Medicare Other

## 2022-02-25 DIAGNOSIS — I2510 Atherosclerotic heart disease of native coronary artery without angina pectoris: Secondary | ICD-10-CM | POA: Insufficient documentation

## 2022-02-25 DIAGNOSIS — N189 Chronic kidney disease, unspecified: Secondary | ICD-10-CM | POA: Insufficient documentation

## 2022-02-25 DIAGNOSIS — N390 Urinary tract infection, site not specified: Secondary | ICD-10-CM | POA: Diagnosis not present

## 2022-02-25 DIAGNOSIS — E119 Type 2 diabetes mellitus without complications: Secondary | ICD-10-CM | POA: Insufficient documentation

## 2022-02-25 DIAGNOSIS — B9689 Other specified bacterial agents as the cause of diseases classified elsewhere: Secondary | ICD-10-CM | POA: Diagnosis not present

## 2022-02-25 DIAGNOSIS — W19XXXA Unspecified fall, initial encounter: Secondary | ICD-10-CM | POA: Diagnosis not present

## 2022-02-25 DIAGNOSIS — M79604 Pain in right leg: Secondary | ICD-10-CM | POA: Diagnosis not present

## 2022-02-25 DIAGNOSIS — I129 Hypertensive chronic kidney disease with stage 1 through stage 4 chronic kidney disease, or unspecified chronic kidney disease: Secondary | ICD-10-CM | POA: Insufficient documentation

## 2022-02-25 DIAGNOSIS — R300 Dysuria: Secondary | ICD-10-CM | POA: Diagnosis present

## 2022-02-25 LAB — URINALYSIS, ROUTINE W REFLEX MICROSCOPIC
Bilirubin Urine: NEGATIVE
Glucose, UA: NEGATIVE mg/dL
Ketones, ur: NEGATIVE mg/dL
Nitrite: NEGATIVE
Protein, ur: 100 mg/dL — AB
Specific Gravity, Urine: 1.023 (ref 1.005–1.030)
WBC, UA: >50 WBC/hpf — ABNORMAL HIGH (ref 0–5)
pH: 5 (ref 5.0–8.0)

## 2022-02-25 LAB — CBG MONITORING, ED: Glucose-Capillary: 100 mg/dL — ABNORMAL HIGH (ref 70–99)

## 2022-02-25 MED ORDER — CEPHALEXIN 500 MG PO CAPS
500.0000 mg | ORAL_CAPSULE | Freq: Once | ORAL | Status: AC
  Administered 2022-02-25: 500 mg via ORAL
  Filled 2022-02-25: qty 1

## 2022-02-25 MED ORDER — CEPHALEXIN 500 MG PO CAPS: 500.0000 mg | ORAL_CAPSULE | Freq: Two times a day (BID) | ORAL | 0 refills | Status: DC

## 2022-02-25 MED ORDER — ACETAMINOPHEN 500 MG PO TABS
1000.0000 mg | ORAL_TABLET | Freq: Once | ORAL | Status: AC
  Administered 2022-02-25: 1000 mg via ORAL
  Filled 2022-02-25: qty 2

## 2022-02-25 MED ORDER — CEPHALEXIN 500 MG PO CAPS: 500.0000 mg | ORAL_CAPSULE | Freq: Two times a day (BID) | ORAL | 0 refills | Status: AC

## 2022-02-25 NOTE — ED Triage Notes (Signed)
Pt states she fell tonight getting up and injured right ankle and foot. Pt with chronic swelling to bilateral legs. Pt denies other injuries.

## 2022-02-25 NOTE — ED Triage Notes (Signed)
Pt to triage via [facility] with c/o BLE pain and edema x 4 days.  Hx of CHF

## 2022-02-25 NOTE — ED Triage Notes (Signed)
Pt from [place]

## 2022-02-25 NOTE — Discharge Instructions (Addendum)
[name] likely has a strain or sprain of her right ankle.  There are no signs of a fracture.  She should follow-up with physical therapy and may bear weight as tolerated.  She also has a urinary tract infection and an antibiotic has been prescribed.  She should return to the emergency department for new, worsening, or persistent severe leg pain, swelling, worsening of the wound, redness or rash, fever, abdominal or flank pain, vomiting or inability to take the antibiotic, or any other new or worsening symptoms that are concerning.

## 2022-02-25 NOTE — ED Provider Notes (Signed)
[facility] Provider Note    Event Date/Time   First MD Initiated Contact with Patient 02/25/22 [phone]     (approximate)   History   Leg Pain   HPI  [name] is a 71 y.o. female with a history of CAD, CKD, diabetes, hypertension, hyperlipidemia, obesity, OSA, and chronic leg wounds who presents with right leg pain after a fall.  The patient states that she got up to try to use the bathroom.  She normally uses a roller walker, but when she stood at the side of her bed to transition to the walker she felt weak and was unable to move further.  After several minutes standing up and unable to get help, she let herself straight down causing her right leg to fold underneath her.  She now has pain in the ankle and foot.  She denies other injuries.  She has no new weakness or numbness in the legs.  She also incidentally reports some dysuria recently.     Physical Exam   Triage Vital Signs: ED Triage Vitals  Enc Vitals Group     BP 02/25/22 0140 (!) 148/107     Pulse Rate 02/25/22 0140 70     Resp 02/25/22 0140 20     Temp 02/25/22 0140 97.7 F (36.5 C)     Temp Source 02/25/22 0529 Oral     SpO2 02/25/22 0140 94 %     Weight 02/25/22 0140 (!) 311 lb (141.1 kg)     Height 02/25/22 0140 5\' 5"  (1.651 m)     Head Circumference --      [facility] Flow --      Pain Score 02/25/22 0140 5     Pain Loc --      Pain Edu? --      Excl. in [place]? --     Most recent vital signs: Vitals:   02/25/22 0800 02/25/22 1004  BP: (!) 162/85 (!) 144/60  Pulse: 72 67  Resp: 19 20  Temp: 98.2 F (36.8 C) 98 F (36.7 C)  SpO2: 90% 96%     General: Awake, no distress.  CV:  Good peripheral perfusion.  Resp:  Normal effort.  Abd:  No distention.  Other:  Bilateral distal lower extremities with superficial wounds weeping clear fluid.  No erythema, induration, or abnormal warmth.  2+ DP pulses bilaterally.  Cap refill less than 2 seconds.  Full range of motion at right  knee, ankle, and foot.  Pain on range of motion of the ankle.  No focal bony tenderness.  No deformity.   ED Results / Procedures / Treatments   Labs (all labs ordered are listed, but only abnormal results are displayed) Labs Reviewed  URINALYSIS, ROUTINE W REFLEX MICROSCOPIC - Abnormal; Notable for the following components:      Result Value   Color, Urine YELLOW (*)    APPearance HAZY (*)    Hgb urine dipstick SMALL (*)    Protein, ur 100 (*)    Leukocytes,Ua LARGE (*)    WBC, UA >50 (*)    Bacteria, UA RARE (*)    All other components within normal limits  CBG MONITORING, ED - Abnormal; Notable for the following components:   Glucose-Capillary 100 (*)    All other components within normal limits     EKG     RADIOLOGY  XR R foot/ankle: I independently viewed and interpreted the images; there is no evidence of acute fracture  PROCEDURES:  Critical Care performed: No  Procedures   MEDICATIONS ORDERED IN ED: Medications  acetaminophen (TYLENOL) tablet 1,000 mg (1,000 mg Oral Given 02/25/22 0918)  cephALEXin (KEFLEX) capsule 500 mg (500 mg Oral Given 02/25/22 1002)     IMPRESSION / MDM / ASSESSMENT AND PLAN / ED COURSE  I reviewed the triage vital signs and the nursing notes.  71 year old female with PMH as noted above presents with right foot and ankle pain after she was unable to transition to her walker, fell down and the right leg folded under her.  She denies other injuries.  On exam there is no deformity and the right lower extremity is neuro/vascular intact.  The patient has chronic bilateral lower extremity wounds which appear clean with no evidence of infection.  Differential diagnosis includes, but is not limited to, sprain, strain, fracture.  Patient's presentation is most consistent with acute complicated illness / injury requiring diagnostic workup.  X-rays were obtained and show no acute fracture (quality somewhat limited by the dressing that the  patient had on the legs during the x-ray, however clinically she also does not have focal tenderness or other evidence of fracture).  There is no indication for repeat imaging.  The patient lives at a nursing facility.  She will be stable for discharge back to the facility and be able to get rehab there.  She also incidentally reports some dysuria so we will check a urinalysis.  There is no indication for lab work-up at this time.  ----------------------------------------- 9:55 AM on 02/25/2022 -----------------------------------------  Urinalysis shows findings consistent with UTI.  The patient's most recent urine culture from earlier this year showed E. coli that was pansensitive, so I have given a dose of Keflex here and prescribed the same for her to take over the next week.  The patient has no fever or evidence of sepsis.  There is no indication for inpatient admission.  I counseled the patient on the results and plan of care.  Return precautions given, and she expressed understanding.   FINAL CLINICAL IMPRESSION(S) / ED DIAGNOSES   Final diagnoses:  Right leg pain  Urinary tract infection without hematuria, site unspecified     Rx / DC Orders   ED Discharge Orders          Ordered    cephALEXin (KEFLEX) 500 MG capsule  2 times daily,   Status:  Discontinued        02/25/22 0953    cephALEXin (KEFLEX) 500 MG capsule  2 times daily        02/25/22 1046             Note:  This document was prepared using Dragon voice recognition software and may include unintentional dictation errors.    Arta Silence, MD 02/25/22 1059

## 2022-02-25 NOTE — ED Notes (Signed)
Report called to [facility] at [facility].

## 2022-03-23 ENCOUNTER — Emergency Department: Payer: Medicare Other

## 2022-03-23 ENCOUNTER — Emergency Department
Admission: EM | Admit: 2022-03-23 | Discharge: 2022-03-23 | Disposition: A | Discharge status: Skilled Nursing Facility | Payer: Medicare Other | Attending: Emergency Medicine | Admitting: Emergency Medicine

## 2022-03-23 ENCOUNTER — Other Ambulatory Visit: Payer: Self-pay

## 2022-03-23 DIAGNOSIS — R799 Abnormal finding of blood chemistry, unspecified: Secondary | ICD-10-CM | POA: Diagnosis not present

## 2022-03-23 DIAGNOSIS — I872 Venous insufficiency (chronic) (peripheral): Secondary | ICD-10-CM | POA: Insufficient documentation

## 2022-03-23 DIAGNOSIS — L97919 Non-pressure chronic ulcer of unspecified part of right lower leg with unspecified severity: Secondary | ICD-10-CM

## 2022-03-23 DIAGNOSIS — M79604 Pain in right leg: Secondary | ICD-10-CM | POA: Diagnosis present

## 2022-03-23 DIAGNOSIS — R0602 Shortness of breath: Secondary | ICD-10-CM | POA: Insufficient documentation

## 2022-03-23 LAB — COMPREHENSIVE METABOLIC PANEL
ALT: 16 U/L (ref 0–44)
AST: 38 U/L (ref 15–41)
Albumin: 3.4 g/dL — ABNORMAL LOW (ref 3.5–5.0)
Alkaline Phosphatase: 57 U/L (ref 38–126)
Anion gap: 12 (ref 5–15)
BUN: 30 mg/dL — ABNORMAL HIGH (ref 8–23)
CO2: 25 mmol/L (ref 22–32)
Calcium: 8.9 mg/dL (ref 8.9–10.3)
Chloride: 104 mmol/L (ref 98–111)
Creatinine, Ser: 1.61 mg/dL — ABNORMAL HIGH (ref 0.44–1.00)
GFR, Estimated: 34 mL/min — ABNORMAL LOW (ref >60)
Glucose, Bld: 88 mg/dL (ref 70–99)
Potassium: 4.2 mmol/L (ref 3.5–5.1)
Sodium: 141 mmol/L (ref 135–145)
Total Bilirubin: 1.2 mg/dL (ref 0.3–1.2)
Total Protein: 7.2 g/dL (ref 6.5–8.1)

## 2022-03-23 LAB — CBC WITH DIFFERENTIAL/PLATELET
Abs Immature Granulocytes: 0.03 10*3/uL (ref 0.00–0.07)
Basophils Absolute: 0 10*3/uL (ref 0.0–0.1)
Basophils Relative: 0 %
Eosinophils Absolute: 0.1 10*3/uL (ref 0.0–0.5)
Eosinophils Relative: 1 %
HCT: 39.2 % (ref 36.0–46.0)
Hemoglobin: 11.7 g/dL — ABNORMAL LOW (ref 12.0–15.0)
Immature Granulocytes: 0 %
Lymphocytes Relative: 6 %
Lymphs Abs: 0.5 10*3/uL — ABNORMAL LOW (ref 0.7–4.0)
MCH: 27.5 pg (ref 26.0–34.0)
MCHC: 29.8 g/dL — ABNORMAL LOW (ref 30.0–36.0)
MCV: 92.2 fL (ref 80.0–100.0)
Monocytes Absolute: 0.7 10*3/uL (ref 0.1–1.0)
Monocytes Relative: 9 %
Neutro Abs: 7.1 10*3/uL (ref 1.7–7.7)
Neutrophils Relative %: 84 %
Platelets: 188 10*3/uL (ref 150–400)
RBC: 4.25 MIL/uL (ref 3.87–5.11)
RDW: 14.9 % (ref 11.5–15.5)
WBC: 8.4 10*3/uL (ref 4.0–10.5)
nRBC: 0 % (ref 0.0–0.2)

## 2022-03-23 LAB — BRAIN NATRIURETIC PEPTIDE: B Natriuretic Peptide: 199.4 pg/mL — ABNORMAL HIGH (ref 0.0–100.0)

## 2022-03-23 LAB — CBG MONITORING, ED: Glucose-Capillary: 103 mg/dL — ABNORMAL HIGH (ref 70–99)

## 2022-03-23 MED ORDER — LIDOCAINE-EPINEPHRINE-TETRACAINE (LET) TOPICAL GEL
3.0000 mL | Freq: Once | TOPICAL | Status: AC
  Administered 2022-03-23: 3 mL via TOPICAL
  Filled 2022-03-23: qty 3

## 2022-03-23 MED ORDER — L.E.T. 4-0.05-0.5 % EX GEL: 1.0000 mL | CUTANEOUS | 0 refills | Status: DC | PRN

## 2022-03-23 NOTE — ED Notes (Signed)
ED Provider at bedside. 

## 2022-03-23 NOTE — ED Notes (Signed)
Pt with soiled brief of urine and BM. Pt cleaned and changed into new brief.

## 2022-03-23 NOTE — ED Provider Notes (Signed)
[facility] Provider Note   Event Date/Time   First MD Initiated Contact with Patient 03/23/22 1044     (approximate) History  Leg Pain (Pt has preexisting bilat leg pain )  HPI [name] is a 71 y.o. female with a past medical history of chronic bilateral lower extremity wounds who presents from her long-term care facility with complaints of worsening leg pain.  Patient states that she has a blister to the right upper lateral leg that is worse recently been unroofed and has caused significant pain.  Patient's facility also states that after giving patient her normally scheduled dose of antianxiety medication she was "not herself".  Patient is alert, oriented, and able to answer questions without difficulty. ROS: Patient currently denies any vision changes, tinnitus, difficulty speaking, facial droop, sore throat, chest pain, abdominal pain, nausea/vomiting/diarrhea, dysuria, or weakness/numbness/paresthesias in any extremity   Physical Exam  Triage Vital Signs: ED Triage Vitals  Enc Vitals Group     BP 03/23/22 1059 (!) 162/67     Pulse Rate 03/23/22 1059 67     Resp 03/23/22 1100 (!) 23     Temp 03/23/22 1059 98.3 F (36.8 C)     Temp Source 03/23/22 1059 Oral     SpO2 03/23/22 1057 96 %     Weight 03/23/22 1101 (!) 311 lb (141.1 kg)     Height 03/23/22 1101 5\' 5"  (1.651 m)     Head Circumference --      Peak Flow --      Pain Score 03/23/22 1100 8     Pain Loc --      Pain Edu? --      Excl. in [place]? --    Most recent vital signs: Vitals:   03/23/22 1200 03/23/22 1230  BP: (!) 147/72 (!) 143/72  Pulse: 62 (!) 59  Resp: (!) 24 19  Temp:    SpO2: 99% 99%   General: Awake, oriented x4. CV:  Good peripheral perfusion.  Resp:  Normal effort.  Abd:  No distention.  Other:  Morbidly obese elderly Caucasian female laying in bed in no acute distress.  Bilateral lower extremities wrapped in gauze with underlying areas of ulceration without  surrounding erythema or induration and without purulent drainage ED Results / Procedures / Treatments  Labs (all labs ordered are listed, but only abnormal results are displayed) Labs Reviewed  COMPREHENSIVE METABOLIC PANEL - Abnormal; Notable for the following components:      Result Value   BUN 30 (*)    Creatinine, Ser 1.61 (*)    Albumin 3.4 (*)    GFR, Estimated 34 (*)    All other components within normal limits  CBC WITH DIFFERENTIAL/PLATELET - Abnormal; Notable for the following components:   Hemoglobin 11.7 (*)    MCHC 29.8 (*)    Lymphs Abs 0.5 (*)    All other components within normal limits  BRAIN NATRIURETIC PEPTIDE - Abnormal; Notable for the following components:   B Natriuretic Peptide 199.4 (*)    All other components within normal limits  CBG MONITORING, ED - Abnormal; Notable for the following components:   Glucose-Capillary 103 (*)    All other components within normal limits   EKG ED ECG REPORT I, Naaman Plummer, the attending physician, personally viewed and interpreted this ECG. Date: 03/23/2022 EKG Time: 1155 Rate: 75 Rhythm: Tracheally paced rhythm rhythm QRS Axis: normal Intervals: Left bundle branch block. ST/T Wave abnormalities: normal Narrative Interpretation:  Ventricular paced rhythm with left bundle branch block.  No evidence of acute ischemia RADIOLOGY ED MD interpretation: One-view portable x-ray of the chest shows cardiac enlargement with vascular congestion and mild interstitial edema -Agree with radiology assessment Official radiology report(s): DG Chest Port 1 View  Result Date: 03/23/2022 CLINICAL DATA:  Shortness of breath. EXAM: PORTABLE CHEST 1 VIEW COMPARISON:  08/30/2021 FINDINGS: The heart is enlarged but appears stable. Stable tortuosity and calcification of the thoracic aorta. 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 Moderate central vascular congestion and mild interstitial edema. No pleural effusions or focal infiltrates. IMPRESSION:  Cardiac enlargement, vascular congestion and mild interstitial edema. Electronically Signed   By: Marijo Sanes M.D.   On: 03/23/2022 12:38   PROCEDURES: Critical Care performed: No .1-3 Lead EKG Interpretation  Performed by: Naaman Plummer, MD Authorized by: Naaman Plummer, MD     Interpretation: abnormal     ECG rate:  65   Rhythm: paced     Ectopy: none     Conduction: normal    MEDICATIONS ORDERED IN ED: Medications  lidocaine-EPINEPHrine-tetracaine (LET) topical gel (has no administration in time range)   IMPRESSION / MDM / ASSESSMENT AND PLAN / ED COURSE  I reviewed the triage vital signs and the nursing notes.                             The patient is on the cardiac monitor to evaluate for evidence of arrhythmia and/or significant heart rate changes. Patient's presentation is most consistent with acute presentation with potential threat to life or bodily function. Presentation most consistent with simple ulcer. Given History, Exam, and Workup I have low suspicion for cellulitis, necrotizing Fasciitis, Abscess, Osteomyelitis, DVT or other emergent problem as a cause for this presentation.  Rx: Pain control with topical lidocaine  Disposition: Discharge. No evidence of serious bacterial illness. Nontoxic appearing, VSS. Low risk for treatment failure based on history. Strict return precautions discussed with patient with full understanding. Advised patient to follow up promptly with primary care provider within next 48 hours.   FINAL CLINICAL IMPRESSION(S) / ED DIAGNOSES   Final diagnoses:  Venous ulcer of right leg ([place])   Rx / DC Orders   ED Discharge Orders          Ordered    lido-EPINEPHrine-Tetracaine (L.E.T.) 4-0.05-0.5 % GEL  Every 1 hour PRN        03/23/22 1308           Note:  This document was prepared using Dragon voice recognition software and may include unintentional dictation errors.   Naaman Plummer, MD 03/23/22 1351

## 2022-03-23 NOTE — ED Triage Notes (Signed)
Pt to ED via [facility] from [facility] facility. EMS was originally called this AM for low glucose (68) but facility was able to feed Pt and next glucose was 115. EMS redispatched to facility after Pt's daily dose of clonopin cause Pt to "not act right". Pt c/o of severe leg pain and facility was concerned about wounds on right lower leg.

## 2022-04-06 ENCOUNTER — Other Ambulatory Visit: Payer: Self-pay

## 2022-04-06 ENCOUNTER — Emergency Department: Payer: Medicare Other

## 2022-04-06 ENCOUNTER — Emergency Department
Admission: EM | Admit: 2022-04-06 | Discharge: 2022-04-06 | Disposition: A | Discharge status: Skilled Nursing Facility | Payer: Medicare Other | Attending: Emergency Medicine | Admitting: Emergency Medicine

## 2022-04-06 DIAGNOSIS — L03115 Cellulitis of right lower limb: Secondary | ICD-10-CM | POA: Insufficient documentation

## 2022-04-06 DIAGNOSIS — I110 Hypertensive heart disease with heart failure: Secondary | ICD-10-CM | POA: Insufficient documentation

## 2022-04-06 DIAGNOSIS — I509 Heart failure, unspecified: Secondary | ICD-10-CM | POA: Insufficient documentation

## 2022-04-06 DIAGNOSIS — E119 Type 2 diabetes mellitus without complications: Secondary | ICD-10-CM | POA: Diagnosis not present

## 2022-04-06 DIAGNOSIS — Z20822 Contact with and (suspected) exposure to covid-19: Secondary | ICD-10-CM | POA: Diagnosis not present

## 2022-04-06 DIAGNOSIS — R0602 Shortness of breath: Secondary | ICD-10-CM | POA: Diagnosis present

## 2022-04-06 LAB — COMPREHENSIVE METABOLIC PANEL
ALT: 18 U/L (ref 0–44)
AST: 36 U/L (ref 15–41)
Albumin: 3.2 g/dL — ABNORMAL LOW (ref 3.5–5.0)
Alkaline Phosphatase: 51 U/L (ref 38–126)
Anion gap: 9 (ref 5–15)
BUN: 32 mg/dL — ABNORMAL HIGH (ref 8–23)
CO2: 26 mmol/L (ref 22–32)
Calcium: 8.6 mg/dL — ABNORMAL LOW (ref 8.9–10.3)
Chloride: 107 mmol/L (ref 98–111)
Creatinine, Ser: UNDETERMINED mg/dL (ref 0.44–1.00)
Glucose, Bld: 133 mg/dL — ABNORMAL HIGH (ref 70–99)
Potassium: 5.1 mmol/L (ref 3.5–5.1)
Sodium: 142 mmol/L (ref 135–145)
Total Bilirubin: 1 mg/dL (ref 0.3–1.2)
Total Protein: 7.1 g/dL (ref 6.5–8.1)

## 2022-04-06 LAB — CBC WITH DIFFERENTIAL/PLATELET
Abs Immature Granulocytes: 0.02 10*3/uL (ref 0.00–0.07)
Basophils Absolute: 0.1 10*3/uL (ref 0.0–0.1)
Basophils Relative: 1 %
Eosinophils Absolute: 0.2 10*3/uL (ref 0.0–0.5)
Eosinophils Relative: 2 %
HCT: 44.5 % (ref 36.0–46.0)
Hemoglobin: 12.7 g/dL (ref 12.0–15.0)
Immature Granulocytes: 0 %
Lymphocytes Relative: 9 %
Lymphs Abs: 0.8 10*3/uL (ref 0.7–4.0)
MCH: 26.5 pg (ref 26.0–34.0)
MCHC: 28.5 g/dL — ABNORMAL LOW (ref 30.0–36.0)
MCV: 92.7 fL (ref 80.0–100.0)
Monocytes Absolute: 0.7 10*3/uL (ref 0.1–1.0)
Monocytes Relative: 8 %
Neutro Abs: 6.8 10*3/uL (ref 1.7–7.7)
Neutrophils Relative %: 80 %
Platelets: 196 10*3/uL (ref 150–400)
RBC: 4.8 MIL/uL (ref 3.87–5.11)
RDW: 14.9 % (ref 11.5–15.5)
WBC: 8.5 10*3/uL (ref 4.0–10.5)
nRBC: 0 % (ref 0.0–0.2)

## 2022-04-06 LAB — D-DIMER, QUANTITATIVE: D-Dimer, Quant: 0.68 ug/mL-FEU — ABNORMAL HIGH (ref 0.00–0.50)

## 2022-04-06 LAB — RESP PANEL BY RT-PCR (FLU A&B, COVID) ARPGX2
Influenza A by PCR: NEGATIVE
Influenza B by PCR: NEGATIVE
SARS Coronavirus 2 by RT PCR: NEGATIVE

## 2022-04-06 LAB — BRAIN NATRIURETIC PEPTIDE: B Natriuretic Peptide: 269 pg/mL — ABNORMAL HIGH (ref 0.0–100.0)

## 2022-04-06 LAB — TROPONIN I (HIGH SENSITIVITY): Troponin I (High Sensitivity): 42 ng/L — ABNORMAL HIGH (ref <18)

## 2022-04-06 MED ORDER — CEPHALEXIN 500 MG PO CAPS: 500.0000 mg | ORAL_CAPSULE | Freq: Three times a day (TID) | ORAL | 0 refills | Status: DC

## 2022-04-06 MED ORDER — FUROSEMIDE 10 MG/ML IJ SOLN
120.0000 mg | Freq: Once | INTRAVENOUS | Status: AC
  Administered 2022-04-06: 120 mg via INTRAVENOUS
  Filled 2022-04-06: qty 2

## 2022-04-06 MED ORDER — DOXYCYCLINE HYCLATE 100 MG PO CAPS: 100.0000 mg | ORAL_CAPSULE | Freq: Two times a day (BID) | ORAL | 0 refills | Status: AC

## 2022-04-06 NOTE — ED Notes (Signed)
Pt noted to be soaked in urine- pt taken to decon shower to be changed and cleaned- new brief, chux, and linens placed- pt denies any other needs at this time

## 2022-04-06 NOTE — ED Provider Notes (Signed)
[facility] Provider Note    Event Date/Time   First MD Initiated Contact with Patient 04/06/22 1248     (approximate)   History   Chief Complaint: Shortness of Breath   HPI  [name] is a 71 y.o. female with a history of CHF, hypertension, paroxysmal atrial fibrillation, diabetes who was sent to the ED from [place] due to shortness of breath.  She is on 2 L nasal cannula oxygen at all times.  EMS reports that she was 98% on room air.  No acute trauma or fever.  No chest pain.     Physical Exam   Triage Vital Signs: ED Triage Vitals  Enc Vitals Group     BP 04/06/22 1235 (!) 150/86     Pulse Rate 04/06/22 1235 60     Resp 04/06/22 1235 16     Temp 04/06/22 1235 97.6 F (36.4 C)     Temp Source 04/06/22 1235 Oral     SpO2 04/06/22 1235 98 %     Weight 04/06/22 1236 (!) 310 lb 13.6 oz (141 kg)     Height 04/06/22 1236 5\' 5"  (1.651 m)     Head Circumference --      Peak Flow --      Pain Score 04/06/22 1236 0     Pain Loc --      Pain Edu? --      Excl. in [place]? --     Most recent vital signs: Vitals:   04/06/22 1235  BP: (!) 150/86  Pulse: 60  Resp: 16  Temp: 97.6 F (36.4 C)  SpO2: 98%    General: Awake, no distress.  CV:  Good peripheral perfusion.  Regular rhythm and bigeminy Resp:  Normal effort.  Clear to auscultation bilaterally Abd:  No distention.  Soft nontender Other:  No lower extremity edema.  Small area of cellulitis around a chronic soft tissue wound on the right lower leg mid shin.  No drainage or crepitus.  No fluctuance.   ED Results / Procedures / Treatments   Labs (all labs ordered are listed, but only abnormal results are displayed) Labs Reviewed  COMPREHENSIVE METABOLIC PANEL - Abnormal; Notable for the following components:      Result Value   Glucose, Bld 133 (*)    BUN 32 (*)    Calcium 8.6 (*)    Albumin 3.2 (*)    All other components within normal limits  CBC WITH  DIFFERENTIAL/PLATELET - Abnormal; Notable for the following components:   MCHC 28.5 (*)    All other components within normal limits  BRAIN NATRIURETIC PEPTIDE - Abnormal; Notable for the following components:   B Natriuretic Peptide 269.0 (*)    All other components within normal limits  D-DIMER, QUANTITATIVE (NOT AT [facility]) - Abnormal; Notable for the following components:   D-Dimer, Quant 0.68 (*)    All other components within normal limits  TROPONIN I (HIGH SENSITIVITY) - Abnormal; Notable for the following components:   Troponin I (High Sensitivity) 42 (*)    All other components within normal limits  RESP PANEL BY RT-PCR (FLU A&B, COVID) ARPGX2  TROPONIN I (HIGH SENSITIVITY)     EKG Interpreted by me Paced rhythm, rate of 76, bigeminy.  Left axis, left bundle branch block.  No acute ischemic changes.   RADIOLOGY Chest x-ray interpreted by me, negative for pleural effusion or pulmonary edema.  No other acute findings.  Radiology report reviewed  noting some vascular congestion   PROCEDURES:  Procedures   MEDICATIONS ORDERED IN ED: Medications  furosemide (LASIX) 120 mg in dextrose 5 % 50 mL IVPB (0 mg Intravenous Stopped 04/06/22 1505)     IMPRESSION / MDM / ASSESSMENT AND PLAN / ED COURSE  I reviewed the triage vital signs and the nursing notes.                              Differential diagnosis includes, but is not limited to, pneumonia, pleural effusion, pulmonary edema, CHF exacerbation, cellulitis, AKI, less likely PE or NSTEMI  Patient's presentation is most consistent with acute presentation with potential threat to life or bodily function.  Patient presents with shortness of breath.  Oxygenation is normal, vital signs are unremarkable.  Labs are essentially all unremarkable with low BNP, D-dimer below and age-adjusted cut off.  High-sensitivity troponin of 42 unremarkable given her chronic disease especially with previous levels being much higher.  Her  chemistries, COVID, CBC are all unremarkable.  She was given IV Lasix for some additional diuresis.  We will start her on Keflex and doxycycline for her lower extremity cellulitis which appears uncomplicated.  Clinical Course as of 04/06/22 1645  Thu Apr 06, 2022  1644 DG Chest 1 View [PS]    Clinical Course User Index [PS] Carrie Mew, MD     FINAL CLINICAL IMPRESSION(S) / ED DIAGNOSES   Final diagnoses:  Cellulitis of right leg  Chronic congestive heart failure, unspecified heart failure type ([place])     Rx / DC Orders   ED Discharge Orders          Ordered    cephALEXin (KEFLEX) 500 MG capsule  3 times daily        04/06/22 1642    doxycycline (VIBRAMYCIN) 100 MG capsule  2 times daily        04/06/22 1642             Note:  This document was prepared using Dragon voice recognition software and may include unintentional dictation errors.   Carrie Mew, MD 04/06/22 [phone]

## 2022-04-06 NOTE — ED Notes (Signed)
Called [facility] for transport to the [facility] [place]

## 2022-04-06 NOTE — ED Triage Notes (Signed)
BIB  [facility] from [place] due to SOB. Pt recently prescribed at home oxygen, awaiting delivery to facility. Pt arrives with bilateral leg wounds. EDP at bedside. EMS reports VSS, 98% on RA but placed on PTA due to recent prescription for oxygen. CBG 176

## 2022-04-06 NOTE — ED Notes (Signed)
Pt taken to decon shower to clean her of urine- clean linens, brief, and chucks placed- pt given warm blanket- pt denies any other needs

## 2022-04-06 NOTE — ED Notes (Signed)
Annie Main, Charge RN dispoed the pt, pt is no longer in the area and [facility] is verified to have picked pt up.

## 2022-04-06 NOTE — ED Notes (Signed)
Pt placed on purewick 

## 2022-04-06 NOTE — ED Notes (Signed)
Called [facility] for update/next in line when a truck is available

## 2022-04-06 NOTE — ED Notes (Signed)
Pt taken for xray

## 2022-04-20 ENCOUNTER — Ambulatory Visit: Payer: Medicare Other | Admitting: Urology

## 2022-04-24 ENCOUNTER — Encounter (INDEPENDENT_AMBULATORY_CARE_PROVIDER_SITE_OTHER): Payer: Self-pay

## 2022-04-28 ENCOUNTER — Emergency Department: Payer: Medicare Other

## 2022-04-28 ENCOUNTER — Other Ambulatory Visit: Payer: Self-pay

## 2022-04-28 ENCOUNTER — Emergency Department
Admission: EM | Admit: 2022-04-28 | Discharge: 2022-04-28 | Disposition: A | Discharge status: Skilled Nursing Facility | Payer: Medicare Other | Source: Home / Self Care | Attending: Emergency Medicine | Admitting: Emergency Medicine

## 2022-04-28 ENCOUNTER — Encounter: Payer: Self-pay | Admitting: Emergency Medicine

## 2022-04-28 DIAGNOSIS — R778 Other specified abnormalities of plasma proteins: Secondary | ICD-10-CM | POA: Insufficient documentation

## 2022-04-28 DIAGNOSIS — Z794 Long term (current) use of insulin: Secondary | ICD-10-CM

## 2022-04-28 DIAGNOSIS — N189 Chronic kidney disease, unspecified: Secondary | ICD-10-CM | POA: Insufficient documentation

## 2022-04-28 DIAGNOSIS — Z20822 Contact with and (suspected) exposure to covid-19: Secondary | ICD-10-CM | POA: Insufficient documentation

## 2022-04-28 DIAGNOSIS — W19XXXA Unspecified fall, initial encounter: Secondary | ICD-10-CM

## 2022-04-28 DIAGNOSIS — R4182 Altered mental status, unspecified: Secondary | ICD-10-CM

## 2022-04-28 DIAGNOSIS — N390 Urinary tract infection, site not specified: Secondary | ICD-10-CM | POA: Diagnosis not present

## 2022-04-28 DIAGNOSIS — I130 Hypertensive heart and chronic kidney disease with heart failure and stage 1 through stage 4 chronic kidney disease, or unspecified chronic kidney disease: Secondary | ICD-10-CM | POA: Insufficient documentation

## 2022-04-28 DIAGNOSIS — E119 Type 2 diabetes mellitus without complications: Secondary | ICD-10-CM

## 2022-04-28 DIAGNOSIS — I509 Heart failure, unspecified: Secondary | ICD-10-CM | POA: Insufficient documentation

## 2022-04-28 DIAGNOSIS — E1122 Type 2 diabetes mellitus with diabetic chronic kidney disease: Secondary | ICD-10-CM | POA: Insufficient documentation

## 2022-04-28 DIAGNOSIS — Y92129 Unspecified place in nursing home as the place of occurrence of the external cause: Secondary | ICD-10-CM | POA: Insufficient documentation

## 2022-04-28 DIAGNOSIS — W06XXXA Fall from bed, initial encounter: Secondary | ICD-10-CM | POA: Insufficient documentation

## 2022-04-28 DIAGNOSIS — Z7901 Long term (current) use of anticoagulants: Secondary | ICD-10-CM | POA: Insufficient documentation

## 2022-04-28 LAB — BLOOD GAS, VENOUS
Acid-Base Excess: 5.6 mmol/L — ABNORMAL HIGH (ref 0.0–2.0)
Bicarbonate: 33.4 mmol/L — ABNORMAL HIGH (ref 20.0–28.0)
O2 Saturation: 49.9 %
Patient temperature: 37
pCO2, Ven: 62 mmHg — ABNORMAL HIGH (ref 44–60)
pH, Ven: 7.34 (ref 7.25–7.43)
pO2, Ven: 37 mmHg (ref 32–45)

## 2022-04-28 LAB — CBC WITH DIFFERENTIAL/PLATELET
Abs Immature Granulocytes: 0.03 10*3/uL (ref 0.00–0.07)
Basophils Absolute: 0 10*3/uL (ref 0.0–0.1)
Basophils Relative: 1 %
Eosinophils Absolute: 0.1 10*3/uL (ref 0.0–0.5)
Eosinophils Relative: 1 %
HCT: 39.3 % (ref 36.0–46.0)
Hemoglobin: 11.7 g/dL — ABNORMAL LOW (ref 12.0–15.0)
Immature Granulocytes: 0 %
Lymphocytes Relative: 6 %
Lymphs Abs: 0.5 10*3/uL — ABNORMAL LOW (ref 0.7–4.0)
MCH: 25.7 pg — ABNORMAL LOW (ref 26.0–34.0)
MCHC: 29.8 g/dL — ABNORMAL LOW (ref 30.0–36.0)
MCV: 86.2 fL (ref 80.0–100.0)
Monocytes Absolute: 0.6 10*3/uL (ref 0.1–1.0)
Monocytes Relative: 8 %
Neutro Abs: 6.4 10*3/uL (ref 1.7–7.7)
Neutrophils Relative %: 84 %
Platelets: 228 10*3/uL (ref 150–400)
RBC: 4.56 MIL/uL (ref 3.87–5.11)
RDW: 15.9 % — ABNORMAL HIGH (ref 11.5–15.5)
WBC: 7.6 10*3/uL (ref 4.0–10.5)
nRBC: 0 % (ref 0.0–0.2)

## 2022-04-28 LAB — URINALYSIS, ROUTINE W REFLEX MICROSCOPIC
Bilirubin Urine: NEGATIVE
Glucose, UA: NEGATIVE mg/dL
Hgb urine dipstick: NEGATIVE
Ketones, ur: NEGATIVE mg/dL
Nitrite: NEGATIVE
Protein, ur: 30 mg/dL — AB
Specific Gravity, Urine: 1.017 (ref 1.005–1.030)
pH: 5 (ref 5.0–8.0)

## 2022-04-28 LAB — COMPREHENSIVE METABOLIC PANEL
ALT: 25 U/L (ref 0–44)
AST: 39 U/L (ref 15–41)
Albumin: 3.2 g/dL — ABNORMAL LOW (ref 3.5–5.0)
Alkaline Phosphatase: 63 U/L (ref 38–126)
Anion gap: 8 (ref 5–15)
BUN: 39 mg/dL — ABNORMAL HIGH (ref 8–23)
CO2: 27 mmol/L (ref 22–32)
Calcium: 8.5 mg/dL — ABNORMAL LOW (ref 8.9–10.3)
Chloride: 104 mmol/L (ref 98–111)
Creatinine, Ser: 1.66 mg/dL — ABNORMAL HIGH (ref 0.44–1.00)
GFR, Estimated: 33 mL/min — ABNORMAL LOW (ref >60)
Glucose, Bld: 140 mg/dL — ABNORMAL HIGH (ref 70–99)
Potassium: 3.8 mmol/L (ref 3.5–5.1)
Sodium: 139 mmol/L (ref 135–145)
Total Bilirubin: 1 mg/dL (ref 0.3–1.2)
Total Protein: 7 g/dL (ref 6.5–8.1)

## 2022-04-28 LAB — TROPONIN I (HIGH SENSITIVITY)
Troponin I (High Sensitivity): 61 ng/L — ABNORMAL HIGH (ref <18)
Troponin I (High Sensitivity): 63 ng/L — ABNORMAL HIGH (ref <18)

## 2022-04-28 LAB — MAGNESIUM: Magnesium: 2.1 mg/dL (ref 1.7–2.4)

## 2022-04-28 LAB — RESP PANEL BY RT-PCR (FLU A&B, COVID) ARPGX2
Influenza A by PCR: NEGATIVE
Influenza B by PCR: NEGATIVE
SARS Coronavirus 2 by RT PCR: NEGATIVE

## 2022-04-28 NOTE — ED Notes (Signed)
Pt's R nare chronically clotted up per pt. L nare partially blocked as well. Pt goes from 88% RA to 93% when this RN requests that she takes lots of deep breaths but then pt goes back to shallow breathing and desats again so placed on 2L via [place].

## 2022-04-28 NOTE — ED Provider Notes (Signed)
Procedures     ----------------------------------------- 7:45 PM on 04/28/2022 ----------------------------------------- Lab panel is all unremarkable.  Troponin is slightly elevated but with a flat trend on repeat, consistent with chronic heart failure.  She has edema, oxygenation is normal.  We will have her increase her Lasix for the next few days for improved diuresis.  Imaging and x-rays are all okay, no significant acute findings that require hospitalization.  Patient also was found incidentally to have a nodule in her left parotid gland.  There is palpable swelling in this area, which is nontender and does not appear to be inflamed.  I advised her of the concern for malignancy and need to follow-up with ENT.     Carrie Mew, MD 04/28/22 [phone]

## 2022-04-28 NOTE — Discharge Instructions (Signed)
Your lab tests, CT scans, and xrays were all okay today.  Continue taking all of your medications as usual, except you should increase lasix to 20mg  two times a day for the next 3 days to remove more excess fluid from your body.

## 2022-04-28 NOTE — ED Notes (Signed)
Pt placed back on 3L via [place]. Provider [place] notified.

## 2022-04-28 NOTE — ED Notes (Signed)
To bedside to obtain resp swab and place IV for blood-work but pt away at CT.

## 2022-04-28 NOTE — ED Notes (Signed)
Pt alert, resting clamly on stretcher; stretcher locked low; rails up; call bell within reach.

## 2022-04-28 NOTE — ED Notes (Signed)
HOB adjusted. Pt assisted with food tray.

## 2022-04-28 NOTE — ED Notes (Signed)
Blue, lt grn, red and lav tubes sent to lab. VBG on ice. Called RT to receive.

## 2022-04-28 NOTE — ED Notes (Signed)
Imaging staff at bedside.  

## 2022-04-28 NOTE — ED Notes (Signed)
Another 50cc amber urine noted in canister.

## 2022-04-28 NOTE — ED Notes (Signed)
Provider Joni Fears added to secure chat inquiring about if pt allow to eat/drink since still no doc assigned to pt's chart.

## 2022-04-28 NOTE — ED Notes (Signed)
EDP [place] confirmed pt has sleep apnea and uses CPAP at night normally. No new orders.

## 2022-04-28 NOTE — ED Triage Notes (Signed)
Patient would not respond initially except to moan.  After a few minutes she is answering appropriately and is oriented.  Says she fell off the bed again.  Says she lives at the [facility].  Says she fell on her left side and says she hit her head.  She is on eloquis.  She has old bruises all over on extremities.  Has a scrape on right big toe. Has old wounds on both lower extremities with redness, flaking and weaping.

## 2022-04-28 NOTE — ED Provider Notes (Signed)
[facility] Provider Note    Event Date/Time   First MD Initiated Contact with Patient 04/28/22 1326     (approximate)   History   Chief Complaint Fall   HPI  [name] is a 71 y.o. female with past medical history of hypertension, diabetes, CAD, CHF, atrial fibrillation on Eliquis, and CKD who presents to the ED following fall.  Patient reports that she was trying to sit on the side of her bed when she slid off and fell to the ground.  She does not think she hit her head and denies losing consciousness.  She was found on the ground by staff at the [facility], who subsequently called EMS.  Patient complains of chronic pain in her right leg, otherwise denies any complaints.  History is limited as patient does appear quite somnolent, frequently falling asleep during my assessment.  She does state that she feels very tired, but is not able to provide additional information.     Physical Exam   Triage Vital Signs: ED Triage Vitals [04/28/22 1304]  Enc Vitals Group     BP 139/78     Pulse Rate (!) 105     Resp 18     Temp 97.6 F (36.4 C)     Temp Source Oral     SpO2 92 %     Weight      Height      Head Circumference      Peak Flow      Pain Score      Pain Loc      Pain Edu?      Excl. in [place]?     Most recent vital signs: Vitals:   04/28/22 1504 04/28/22 1507  BP:    Pulse:    Resp:    Temp:    SpO2: (!) 88% 98%    Constitutional: Somnolent but arousable to voice, oriented to person and place, but not time or situation. Eyes: Conjunctivae are normal. Head: Atraumatic. Nose: No congestion/rhinnorhea. Mouth/Throat: Mucous membranes are moist.  Neck: Midline cervical spine tenderness to palpation noted. Cardiovascular: Normal rate, irregularly irregular rhythm. Grossly normal heart sounds.  2+ radial pulses bilaterally, 1+ DP pulses bilaterally. Respiratory: Normal respiratory effort.  No retractions. Lungs  CTAB. Gastrointestinal: Soft and nontender. No distention. Musculoskeletal: No lower extremity tenderness nor edema.  Neurologic:  Normal speech and language. No gross focal neurologic deficits are appreciated.    ED Results / Procedures / Treatments   Labs (all labs ordered are listed, but only abnormal results are displayed) Labs Reviewed  CBC WITH DIFFERENTIAL/PLATELET - Abnormal; Notable for the following components:      Result Value   Hemoglobin 11.7 (*)    MCH 25.7 (*)    MCHC 29.8 (*)    RDW 15.9 (*)    Lymphs Abs 0.5 (*)    All other components within normal limits  RESP PANEL BY RT-PCR (FLU A&B, COVID) ARPGX2  COMPREHENSIVE METABOLIC PANEL  URINALYSIS, ROUTINE W REFLEX MICROSCOPIC  BLOOD GAS, VENOUS  MAGNESIUM  TROPONIN I (HIGH SENSITIVITY)  TROPONIN I (HIGH SENSITIVITY)     EKG  ED ECG REPORT I, Blake Divine, the attending physician, personally viewed and interpreted this ECG.   Date: 04/28/2022  EKG Time: 13:32  Rate: 60  Rhythm: atrial fibrillation  Axis: LAD  Intervals:left bundle branch block  ST&T Change: None  RADIOLOGY Chest x-ray reviewed and interpreted by me with bilateral interstitial infiltrates  concerning for edema, no focal infiltrate or effusion noted.  Pelvic x-ray reviewed and interpreted by me with no fracture or dislocation noted.  PROCEDURES:  Critical Care performed: No  Procedures   MEDICATIONS ORDERED IN ED: Medications - No data to display   IMPRESSION / MDM / [place] / ED COURSE  I reviewed the triage vital signs and the nursing notes.                              71 y.o. female with past medical history of hypertension, diabetes, atrial fibrillation on Eliquis, CAD, CHF, and CKD who presents to the ED for fall at her nursing facility while sitting on the side of the bed, now appears quite somnolent here in the ED.  Patient's presentation is most consistent with acute presentation with potential threat  to life or bodily function.  Differential diagnosis includes, but is not limited to, intracranial injury, cervical spine injury, hypoxic respiratory failure, hypercapnic respiratory failure, COPD, electrolyte abnormality, AKI, UTI, CHF, pneumonia, obesity hypoventilation syndrome, OSA.  Patient chronically ill-appearing but nontoxic, is quite somnolent but easily arousable to voice, slightly disoriented when aroused but with no focal neurologic deficits on exam.  Plan to check CT head and cervical spine given fall, no evidence of acute traumatic injury to her extremities although she does have multiple areas of bruising from prior falls.  She is not in any respiratory distress and maintaining oxygen saturations on room air, however given increased somnolence with history of OSA and significant obesity, will check VBG for hypercapnic respiratory failure.  Chest x-ray concerning for pulmonary edema and she may benefit from diuresis.  She has signs of chronic venous stasis to her bilateral lower extremities, no obvious signs of acute infection.  Patient turned over to my care provider pending additional lab and imaging results.      FINAL CLINICAL IMPRESSION(S) / ED DIAGNOSES   Final diagnoses:  Fall, initial encounter  Altered mental status, unspecified altered mental status type     Rx / DC Orders   ED Discharge Orders     None        Note:  This document was prepared using Dragon voice recognition software and may include unintentional dictation errors.   Blake Divine, MD 04/28/22 1535

## 2022-04-28 NOTE — ED Notes (Signed)
EDP [place] at bedside. Assisted provider to reposition pt. Pt malodorous. 2 NT to assist this RN soon to give pt peri care and bed bath; multiple bruises and abrasions noted on pt's body; mostly to extremities; small lac to bottom of pt's R great toe.

## 2022-04-28 NOTE — ED Notes (Signed)
Pt leaving with EMS; EMS staff given report.

## 2022-04-28 NOTE — ED Notes (Signed)
First Nurse Note: Pt to ED via [facility] from [facility]. Pt is on Eliquis. Pt c/o right leg pain, edema, and laceration on the right foot.   Pt VSS stable per EMS 273- CBG

## 2022-04-28 NOTE — ED Notes (Addendum)
150cc amber urine noted in canister from pure wick. Pt finished with dinner tray; pt 85% RA while breathing through her mouth; pt prompted to take deep breaths and SpO2 up to 97% RA. Pt repositioned again.

## 2022-04-28 NOTE — ED Notes (Signed)
This RN, [name] RN and Su Grand took last 30 minutes thoroughly cleaning pt up; peri care; pt had very large BM dried into vagina/upper thighs/sacrum as well as new soft BM noted; pt had 2 briefs layered on top of each other from facility; lots of skin issues noted at sacrum/rectum/vagina due to moisture and possibly pressure; moisture barrier cream applied; sacral foam dressing applied; lots of open weeping blisters noted along pt's legs; legs propped up on pillows; pt repositioned; I&O cath completed and urine sample sent to lab; purewick applied and pt educated; BP cuff applied to pt's L wrist since previously at R upper arm but now too close to IV; bed linens changed; stretcher locked low; rails up; call bell within reach; when pt tries to concentrate eyes on this RN they continue to shift side to side same as when pt first arrived to room; pt has her glasses on.

## 2022-04-28 NOTE — ED Notes (Signed)
[facility]arr[facility]'s clerk setting up EMS transport.

## 2022-04-28 NOTE — ED Notes (Signed)
Messaged provider Quentin Cornwall via secure chat as no MD currently assigned to chart and pt in room 17 inquiring whether pt allowed to drink and eat if she can pass swallow screen. No reply.

## 2022-04-28 NOTE — ED Notes (Signed)
Pt trialed on RA; pt 91-98% RA while eating; pt's sat constantly fluctuating.

## 2022-04-28 NOTE — ED Provider Triage Note (Signed)
Emergency Medicine Provider Triage Evaluation Note  [name] , a 71 y.o. female  was evaluated in triage.  Pt here via EMS with complains of falling out of bed today. Minimal verbal responses. Patient on eliquis  Review of Systems  Positive: "Head hurts" abrasion right foot Negative: No N/v  Physical Exam  There were no vitals taken for this visit. Gen:   Awake, no distress  answers  simple questions only Resp:  Normal effort  MSK:   Chronic skin changes and edema lower extremities. Other:  No obvious trauma to head  Medical Decision Making  Medically screening exam initiated at 1:00 PM.  Appropriate orders placed.  [name] was informed that the remainder of the evaluation will be completed by another provider, this initial triage assessment does not replace that evaluation, and the importance of remaining in the ED until their evaluation is complete.     Johnn Hai, PA-C 04/28/22 1435

## 2022-04-28 NOTE — ED Notes (Signed)
Pt A&Ox3 (states year is "2024"); reports was sitting on side of bed and slipped off; denies hitting head; speech easily understood; pt denies CP and HA; confirms lives at the [facility].

## 2022-04-28 NOTE — ED Notes (Signed)
[facility] to transport pt to the [facility] 

## 2022-04-29 ENCOUNTER — Emergency Department: Payer: Medicare Other

## 2022-04-29 ENCOUNTER — Inpatient Hospital Stay
Admission: EM | Admit: 2022-04-29 | Discharge: 2022-05-10 | DRG: 689 | Disposition: A | Discharge status: Skilled Nursing Facility | Payer: Medicare Other | Source: Skilled Nursing Facility | Attending: Internal Medicine | Admitting: Internal Medicine

## 2022-04-29 DIAGNOSIS — Z6841 Body Mass Index (BMI) 40.0 and over, adult: Secondary | ICD-10-CM

## 2022-04-29 DIAGNOSIS — E162 Hypoglycemia, unspecified: Secondary | ICD-10-CM | POA: Diagnosis not present

## 2022-04-29 DIAGNOSIS — E785 Hyperlipidemia, unspecified: Secondary | ICD-10-CM | POA: Diagnosis present

## 2022-04-29 DIAGNOSIS — G9341 Metabolic encephalopathy: Secondary | ICD-10-CM | POA: Diagnosis present

## 2022-04-29 DIAGNOSIS — Z79899 Other long term (current) drug therapy: Secondary | ICD-10-CM

## 2022-04-29 DIAGNOSIS — I5042 Chronic combined systolic (congestive) and diastolic (congestive) heart failure: Secondary | ICD-10-CM | POA: Diagnosis present

## 2022-04-29 DIAGNOSIS — E119 Type 2 diabetes mellitus without complications: Secondary | ICD-10-CM

## 2022-04-29 DIAGNOSIS — Z7901 Long term (current) use of anticoagulants: Secondary | ICD-10-CM

## 2022-04-29 DIAGNOSIS — W06XXXA Fall from bed, initial encounter: Secondary | ICD-10-CM | POA: Diagnosis present

## 2022-04-29 DIAGNOSIS — N1832 Chronic kidney disease, stage 3b: Secondary | ICD-10-CM | POA: Diagnosis present

## 2022-04-29 DIAGNOSIS — R4182 Altered mental status, unspecified: Secondary | ICD-10-CM | POA: Diagnosis present

## 2022-04-29 DIAGNOSIS — Z20822 Contact with and (suspected) exposure to covid-19: Secondary | ICD-10-CM | POA: Diagnosis present

## 2022-04-29 DIAGNOSIS — B372 Candidiasis of skin and nail: Secondary | ICD-10-CM | POA: Diagnosis present

## 2022-04-29 DIAGNOSIS — I130 Hypertensive heart and chronic kidney disease with heart failure and stage 1 through stage 4 chronic kidney disease, or unspecified chronic kidney disease: Secondary | ICD-10-CM | POA: Diagnosis present

## 2022-04-29 DIAGNOSIS — E11649 Type 2 diabetes mellitus with hypoglycemia without coma: Secondary | ICD-10-CM | POA: Diagnosis present

## 2022-04-29 DIAGNOSIS — I2510 Atherosclerotic heart disease of native coronary artery without angina pectoris: Secondary | ICD-10-CM | POA: Diagnosis present

## 2022-04-29 DIAGNOSIS — N3000 Acute cystitis without hematuria: Secondary | ICD-10-CM

## 2022-04-29 DIAGNOSIS — R159 Full incontinence of feces: Secondary | ICD-10-CM | POA: Diagnosis present

## 2022-04-29 DIAGNOSIS — A0472 Enterocolitis due to Clostridium difficile, not specified as recurrent: Secondary | ICD-10-CM | POA: Diagnosis present

## 2022-04-29 DIAGNOSIS — Z888 Allergy status to other drugs, medicaments and biological substances status: Secondary | ICD-10-CM

## 2022-04-29 DIAGNOSIS — I2489 Other forms of acute ischemic heart disease: Secondary | ICD-10-CM | POA: Diagnosis present

## 2022-04-29 DIAGNOSIS — E1122 Type 2 diabetes mellitus with diabetic chronic kidney disease: Secondary | ICD-10-CM | POA: Diagnosis present

## 2022-04-29 DIAGNOSIS — Z87442 Personal history of urinary calculi: Secondary | ICD-10-CM

## 2022-04-29 DIAGNOSIS — I872 Venous insufficiency (chronic) (peripheral): Secondary | ICD-10-CM | POA: Diagnosis present

## 2022-04-29 DIAGNOSIS — R296 Repeated falls: Secondary | ICD-10-CM | POA: Diagnosis present

## 2022-04-29 DIAGNOSIS — N179 Acute kidney failure, unspecified: Secondary | ICD-10-CM | POA: Diagnosis present

## 2022-04-29 DIAGNOSIS — J9611 Chronic respiratory failure with hypoxia: Secondary | ICD-10-CM | POA: Diagnosis present

## 2022-04-29 DIAGNOSIS — Z794 Long term (current) use of insulin: Secondary | ICD-10-CM | POA: Diagnosis not present

## 2022-04-29 DIAGNOSIS — R7989 Other specified abnormal findings of blood chemistry: Secondary | ICD-10-CM | POA: Diagnosis present

## 2022-04-29 DIAGNOSIS — F419 Anxiety disorder, unspecified: Secondary | ICD-10-CM | POA: Diagnosis present

## 2022-04-29 DIAGNOSIS — I350 Nonrheumatic aortic (valve) stenosis: Secondary | ICD-10-CM | POA: Diagnosis present

## 2022-04-29 DIAGNOSIS — I5022 Chronic systolic (congestive) heart failure: Secondary | ICD-10-CM | POA: Diagnosis not present

## 2022-04-29 DIAGNOSIS — Z887 Allergy status to serum and vaccine status: Secondary | ICD-10-CM

## 2022-04-29 DIAGNOSIS — L0390 Cellulitis, unspecified: Secondary | ICD-10-CM | POA: Diagnosis present

## 2022-04-29 DIAGNOSIS — I480 Paroxysmal atrial fibrillation: Secondary | ICD-10-CM | POA: Diagnosis present

## 2022-04-29 DIAGNOSIS — Z882 Allergy status to sulfonamides status: Secondary | ICD-10-CM

## 2022-04-29 DIAGNOSIS — G4733 Obstructive sleep apnea (adult) (pediatric): Secondary | ICD-10-CM | POA: Diagnosis present

## 2022-04-29 DIAGNOSIS — B952 Enterococcus as the cause of diseases classified elsewhere: Secondary | ICD-10-CM | POA: Diagnosis present

## 2022-04-29 DIAGNOSIS — I890 Lymphedema, not elsewhere classified: Secondary | ICD-10-CM | POA: Diagnosis present

## 2022-04-29 DIAGNOSIS — N390 Urinary tract infection, site not specified: Principal | ICD-10-CM | POA: Diagnosis present

## 2022-04-29 DIAGNOSIS — I252 Old myocardial infarction: Secondary | ICD-10-CM

## 2022-04-29 DIAGNOSIS — K219 Gastro-esophageal reflux disease without esophagitis: Secondary | ICD-10-CM | POA: Diagnosis present

## 2022-04-29 DIAGNOSIS — R531 Weakness: Secondary | ICD-10-CM | POA: Diagnosis not present

## 2022-04-29 DIAGNOSIS — G8929 Other chronic pain: Secondary | ICD-10-CM | POA: Diagnosis present

## 2022-04-29 DIAGNOSIS — Z950 Presence of cardiac pacemaker: Secondary | ICD-10-CM

## 2022-04-29 DIAGNOSIS — Z8249 Family history of ischemic heart disease and other diseases of the circulatory system: Secondary | ICD-10-CM

## 2022-04-29 HISTORY — DX: Metabolic encephalopathy: G93.41

## 2022-04-29 HISTORY — DX: Urinary tract infection, site not specified: N39.0

## 2022-04-29 HISTORY — DX: Other specified abnormal findings of blood chemistry: R79.89

## 2022-04-29 LAB — PROTIME-INR
INR: 1.8 — ABNORMAL HIGH (ref 0.8–1.2)
Prothrombin Time: 20.3 seconds — ABNORMAL HIGH (ref 11.4–15.2)

## 2022-04-29 LAB — CBC WITH DIFFERENTIAL/PLATELET
Abs Immature Granulocytes: 0.02 10*3/uL (ref 0.00–0.07)
Basophils Absolute: 0 10*3/uL (ref 0.0–0.1)
Basophils Relative: 1 %
Eosinophils Absolute: 0.1 10*3/uL (ref 0.0–0.5)
Eosinophils Relative: 1 %
HCT: 39.1 % (ref 36.0–46.0)
Hemoglobin: 11.6 g/dL — ABNORMAL LOW (ref 12.0–15.0)
Immature Granulocytes: 0 %
Lymphocytes Relative: 6 %
Lymphs Abs: 0.5 10*3/uL — ABNORMAL LOW (ref 0.7–4.0)
MCH: 25.6 pg — ABNORMAL LOW (ref 26.0–34.0)
MCHC: 29.7 g/dL — ABNORMAL LOW (ref 30.0–36.0)
MCV: 86.3 fL (ref 80.0–100.0)
Monocytes Absolute: 0.6 10*3/uL (ref 0.1–1.0)
Monocytes Relative: 8 %
Neutro Abs: 6.5 10*3/uL (ref 1.7–7.7)
Neutrophils Relative %: 84 %
Platelets: 226 10*3/uL (ref 150–400)
RBC: 4.53 MIL/uL (ref 3.87–5.11)
RDW: 16 % — ABNORMAL HIGH (ref 11.5–15.5)
WBC: 7.7 10*3/uL (ref 4.0–10.5)
nRBC: 0 % (ref 0.0–0.2)

## 2022-04-29 LAB — APTT
aPTT: >200 seconds (ref 24–36)
aPTT: 60 seconds — ABNORMAL HIGH (ref 24–36)

## 2022-04-29 LAB — HEPARIN LEVEL (UNFRACTIONATED): Heparin Unfractionated: >1.1 IU/mL — ABNORMAL HIGH (ref 0.30–0.70)

## 2022-04-29 LAB — RESP PANEL BY RT-PCR (FLU A&B, COVID) ARPGX2
Influenza A by PCR: NEGATIVE
Influenza B by PCR: NEGATIVE
SARS Coronavirus 2 by RT PCR: NEGATIVE

## 2022-04-29 LAB — COMPREHENSIVE METABOLIC PANEL
ALT: 25 U/L (ref 0–44)
AST: 46 U/L — ABNORMAL HIGH (ref 15–41)
Albumin: 3.3 g/dL — ABNORMAL LOW (ref 3.5–5.0)
Alkaline Phosphatase: 59 U/L (ref 38–126)
Anion gap: 10 (ref 5–15)
BUN: 42 mg/dL — ABNORMAL HIGH (ref 8–23)
CO2: 29 mmol/L (ref 22–32)
Calcium: 8.9 mg/dL (ref 8.9–10.3)
Chloride: 106 mmol/L (ref 98–111)
Creatinine, Ser: 1.75 mg/dL — ABNORMAL HIGH (ref 0.44–1.00)
GFR, Estimated: 31 mL/min — ABNORMAL LOW (ref >60)
Glucose, Bld: 130 mg/dL — ABNORMAL HIGH (ref 70–99)
Potassium: 4.2 mmol/L (ref 3.5–5.1)
Sodium: 145 mmol/L (ref 135–145)
Total Bilirubin: 1.4 mg/dL — ABNORMAL HIGH (ref 0.3–1.2)
Total Protein: 7.5 g/dL (ref 6.5–8.1)

## 2022-04-29 LAB — PHOSPHORUS: Phosphorus: 3.5 mg/dL (ref 2.5–4.6)

## 2022-04-29 LAB — TROPONIN I (HIGH SENSITIVITY)
Troponin I (High Sensitivity): 72 ng/L — ABNORMAL HIGH (ref <18)
Troponin I (High Sensitivity): 105 ng/L (ref <18)

## 2022-04-29 LAB — GLUCOSE, CAPILLARY
Glucose-Capillary: 120 mg/dL — ABNORMAL HIGH (ref 70–99)
Glucose-Capillary: 119 mg/dL — ABNORMAL HIGH (ref 70–99)

## 2022-04-29 LAB — LACTIC ACID, PLASMA: Lactic Acid, Venous: 1.5 mmol/L (ref 0.5–1.9)

## 2022-04-29 LAB — VITAMIN B12: Vitamin B-12: 380 pg/mL (ref 180–914)

## 2022-04-29 LAB — PROCALCITONIN: Procalcitonin: <0.1 ng/mL

## 2022-04-29 LAB — MAGNESIUM: Magnesium: 2.2 mg/dL (ref 1.7–2.4)

## 2022-04-29 MED ORDER — ACETAMINOPHEN 325 MG PO TABS
650.0000 mg | ORAL_TABLET | Freq: Four times a day (QID) | ORAL | Status: AC | PRN
  Administered 2022-04-29 – 2022-05-03 (×3): 650 mg via ORAL
  Filled 2022-04-29 (×3): qty 2

## 2022-04-29 MED ORDER — APIXABAN 5 MG PO TABS: 5.0000 mg | ORAL_TABLET | Freq: Two times a day (BID) | ORAL | Status: DC

## 2022-04-29 MED ORDER — ONDANSETRON HCL 4 MG/2ML IJ SOLN: 4.0000 mg | Freq: Four times a day (QID) | INTRAMUSCULAR | Status: AC | PRN

## 2022-04-29 MED ORDER — SODIUM CHLORIDE 0.9 % IV SOLN
2.0000 g | INTRAVENOUS | Status: DC
  Administered 2022-04-30 – 2022-05-01 (×2): 2 g via INTRAVENOUS
  Filled 2022-04-29: qty 20
  Filled 2022-04-29 (×2): qty 2

## 2022-04-29 MED ORDER — HEPARIN (PORCINE) 25000 UT/250ML-% IV SOLN
1600.0000 [IU]/h | INTRAVENOUS | Status: DC
  Administered 2022-04-29: 1300 [IU]/h via INTRAVENOUS
  Administered 2022-04-30: 1500 [IU]/h via INTRAVENOUS
  Filled 2022-04-29 (×2): qty 250

## 2022-04-29 MED ORDER — PANTOPRAZOLE SODIUM 40 MG PO TBEC
40.0000 mg | DELAYED_RELEASE_TABLET | Freq: Every day | ORAL | Status: DC
  Administered 2022-04-30 – 2022-05-10 (×11): 40 mg via ORAL
  Filled 2022-04-29 (×11): qty 1

## 2022-04-29 MED ORDER — HEPARIN BOLUS VIA INFUSION
4000.0000 [IU] | Freq: Once | INTRAVENOUS | Status: AC
  Administered 2022-04-29: 4000 [IU] via INTRAVENOUS
  Filled 2022-04-29: qty 4000

## 2022-04-29 MED ORDER — FENOFIBRATE 160 MG PO TABS
160.0000 mg | ORAL_TABLET | Freq: Every day | ORAL | Status: DC
  Administered 2022-04-30 – 2022-05-09 (×10): 160 mg via ORAL
  Filled 2022-04-29 (×11): qty 1

## 2022-04-29 MED ORDER — INSULIN ASPART 100 UNIT/ML IJ SOLN
0.0000 [IU] | Freq: Every day | INTRAMUSCULAR | Status: DC
  Administered 2022-04-30 – 2022-05-06 (×2): 2 [IU] via SUBCUTANEOUS
  Filled 2022-04-29 (×2): qty 1

## 2022-04-29 MED ORDER — INSULIN ASPART 100 UNIT/ML IJ SOLN
0.0000 [IU] | Freq: Three times a day (TID) | INTRAMUSCULAR | Status: DC
  Administered 2022-04-30 – 2022-05-01 (×4): 2 [IU] via SUBCUTANEOUS
  Administered 2022-05-02: 1 [IU] via SUBCUTANEOUS
  Administered 2022-05-02: 2 [IU] via SUBCUTANEOUS
  Administered 2022-05-04 (×2): 1 [IU] via SUBCUTANEOUS
  Administered 2022-05-05 – 2022-05-06 (×3): 2 [IU] via SUBCUTANEOUS
  Administered 2022-05-06 – 2022-05-08 (×5): 1 [IU] via SUBCUTANEOUS
  Administered 2022-05-08: 2 [IU] via SUBCUTANEOUS
  Administered 2022-05-08: 1 [IU] via SUBCUTANEOUS
  Administered 2022-05-09: 2 [IU] via SUBCUTANEOUS
  Administered 2022-05-09: 1 [IU] via SUBCUTANEOUS
  Administered 2022-05-10: 2 [IU] via SUBCUTANEOUS
  Filled 2022-04-29 (×21): qty 1

## 2022-04-29 MED ORDER — ACETAMINOPHEN 650 MG RE SUPP: 650.0000 mg | Freq: Four times a day (QID) | RECTAL | Status: AC | PRN

## 2022-04-29 MED ORDER — INSULIN DETEMIR 100 UNIT/ML ~~LOC~~ SOLN
65.0000 [IU] | Freq: Every day | SUBCUTANEOUS | Status: DC
  Administered 2022-04-30 – 2022-05-01 (×2): 65 [IU] via SUBCUTANEOUS
  Filled 2022-04-29 (×3): qty 0.65

## 2022-04-29 MED ORDER — SODIUM CHLORIDE 0.9 % IV SOLN
1.0000 g | Freq: Once | INTRAVENOUS | Status: AC
  Administered 2022-04-29: 1 g via INTRAVENOUS
  Filled 2022-04-29: qty 10

## 2022-04-29 MED ORDER — PRO-STAT SUGAR FREE PO LIQD
30.0000 mL | Freq: Two times a day (BID) | ORAL | Status: DC
  Administered 2022-05-02 – 2022-05-08 (×5): 30 mL via ORAL

## 2022-04-29 MED ORDER — ONDANSETRON HCL 4 MG PO TABS: 4.0000 mg | ORAL_TABLET | Freq: Four times a day (QID) | ORAL | Status: AC | PRN

## 2022-04-29 MED ORDER — ATORVASTATIN CALCIUM 20 MG PO TABS
40.0000 mg | ORAL_TABLET | Freq: Every day | ORAL | Status: DC
  Administered 2022-04-29 – 2022-05-09 (×11): 40 mg via ORAL
  Filled 2022-04-29 (×11): qty 2

## 2022-04-29 MED ORDER — FUROSEMIDE 20 MG PO TABS
20.0000 mg | ORAL_TABLET | Freq: Every day | ORAL | Status: DC
  Administered 2022-04-30 – 2022-05-07 (×8): 20 mg via ORAL
  Filled 2022-04-29 (×8): qty 1

## 2022-04-29 MED ORDER — SENNOSIDES-DOCUSATE SODIUM 8.6-50 MG PO TABS: 1.0000 | ORAL_TABLET | Freq: Every evening | ORAL | Status: DC | PRN

## 2022-04-29 MED ORDER — MIRABEGRON ER 50 MG PO TB24
50.0000 mg | ORAL_TABLET | Freq: Every day | ORAL | Status: DC
  Administered 2022-04-30 – 2022-05-10 (×11): 50 mg via ORAL
  Filled 2022-04-29 (×11): qty 1

## 2022-04-29 MED ORDER — CLONAZEPAM 0.5 MG PO TABS
0.5000 mg | ORAL_TABLET | Freq: Two times a day (BID) | ORAL | Status: DC | PRN
  Administered 2022-04-30 – 2022-05-09 (×5): 0.5 mg via ORAL
  Filled 2022-04-29 (×5): qty 1

## 2022-04-29 MED ORDER — NYSTATIN 100000 UNIT/GM EX POWD: Freq: Two times a day (BID) | CUTANEOUS | Status: DC | PRN

## 2022-04-29 NOTE — Assessment & Plan Note (Signed)
-   Atorvastatin 40 mg daily resumed 

## 2022-04-29 NOTE — ED Notes (Signed)
Asked ed secretary to page Dr Tobie Poet to inform of troponin result 105.

## 2022-04-29 NOTE — Assessment & Plan Note (Deleted)
Query metabolic encephalopathy Presumed secondary to UTI - Etiology work-up in progress - Check lactic acid x2, ordered urine culture, COVID/influenza A/influenza B PCR have been ordered and pending collection - Blood cultures x2 ordered and pending collection - Status post ceftriaxone 1 g IV per EDP - If patient's lactic acid is elevated, we will administer ceftriaxone 2 g IV and order blood cultures x2 as appropriate - Admit to telemetry medical, inpatient

## 2022-04-29 NOTE — ED Notes (Signed)
Attempted 2 ekgs. Har dto get good reading. Handed to provider. Appears NSR on monitor.

## 2022-04-29 NOTE — Hospital Course (Signed)
[name] is a 71 year old female with history of hyperlipidemia, insulin-dependent diabetes mellitus, GERD, morbid obesity, mostly bedbound, cannot sit on edge of bed, presents emergency department for chief concerns of altered mental status.  Initial vitals in the emergency department showed temperature of 97.3, respiration rate of 23, heart rate of 78, blood pressure 136/71, SPO2 97% on room air.  Serum sodium is 145, potassium 4.2, chloride 106, bicarb 29, BUN of 42, serum creatinine 1.75, GFR 31, nonfasting blood glucose 130, WBC 7.7, hemoglobin 11.6, platelets of 226.  High since troponin was 72.  UA was positive for small leukocytes on 04/28/2022. UA has been ordered and pending collection.  ED treatment ceftriaxone 1 g IV one-time dose.

## 2022-04-29 NOTE — Assessment & Plan Note (Signed)
-   Continue with ceftriaxone 2 g daily

## 2022-04-29 NOTE — Assessment & Plan Note (Addendum)
RULED OUT Cellulitis of bilateral LE's While both distal LE's are erythematous, there is no differential warmth on palpation and wounds not draining any purulent fluid. 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

## 2022-04-29 NOTE — ED Notes (Signed)
Pt moaning. Asked pt what is hurting. Pt unable to identify what is hurting her. Asked if legs hurt. Pt says yes. Offered to reposition pt on side. Pt denies.

## 2022-04-29 NOTE — ED Notes (Signed)
Unable to get bloodwork. Will get u;s IV. Pt has IV that flushes but no bloodwork.

## 2022-04-29 NOTE — H&P (Signed)
History and Physical   [name] DOB: 04/21/1951 DOA: 04/29/2022  PCP: Orvis Brill, [facility]  Outpatient Specialists: Dr. Stevphen [facility] clinic cardiology Patient coming from: [place] via EMS  I have personally briefly reviewed patient's old medical records in [place].  Chief Concern: Altered mental status  HPI: [name] is a 71 year old female with history of hyperlipidemia, insulin-dependent diabetes mellitus, GERD, morbid obesity, mostly bedbound, cannot sit on edge of bed, presents emergency department for chief concerns of altered mental status.  Initial vitals in the emergency department showed temperature of 97.3, respiration rate of 23, heart rate of 78, blood pressure 136/71, SPO2 97% on room air.  Serum sodium is 145, potassium 4.2, chloride 106, bicarb 29, BUN of 42, serum creatinine 1.75, GFR 31, nonfasting blood glucose 130, WBC 7.7, hemoglobin 11.6, platelets of 226.  High since troponin was 72.  UA was positive for small leukocytes on 04/28/2022. UA has been ordered and pending collection.  ED treatment ceftriaxone 1 g IV one-time dose.  At bedside, she is able to tell me her name, age, current location, and current calendar year.   She has cough with productive thick sputum since August.  She endorses dysuria occasionally.  She denies chest pain, changes to her shortness of breath. She reports her main concern is the inability to walk and the wounds.  She denies trauma to her person.  She reports that she has not been having good time since August.  Prior to August 2023, patient was able to walk with a rollator.  Social history: She lives by herself. She is currently from [place]. She denies tobacco, etoh, and recreational drug use. 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  ROS: Constitutional: no weight change, no fever ENT/Mouth: no sore throat, no rhinorrhea Eyes: no eye pain, no vision changes Cardiovascular: no chest pain, + dyspnea,  no edema, no palpitations Respiratory: no cough, no sputum, no wheezing Gastrointestinal: no nausea, no vomiting, no diarrhea, no constipation Genitourinary: no urinary incontinence, no dysuria, no hematuria Musculoskeletal: no arthralgias, no myalgias Skin: no skin lesions, no pruritus, Neuro: + weakness, no loss of consciousness, no syncope Psych: no anxiety, no depression, + decrease appetite Heme/Lymph: no bruising, no bleeding  ED Course: Discussed with emergency medicine provider, patient requiring hospitalization for chief concerns of altered mental status.  Assessment/Plan  Principal Problem:   Altered mental status Active Problems:   Chronic systolic CHF (congestive heart failure) (HCC)   Obstructive sleep apnea   Paroxysmal atrial fibrillation (HCC)   Morbid obesity (HCC)   CAD (coronary artery disease)   OSA on CPAP   Cellulitis   Stage 3b chronic kidney disease (CKD) (HCC)   Elevated troponin   UTI (urinary tract infection)   Assessment and Plan:  * Altered mental status Query metabolic encephalopathy Presumed secondary to UTI - Etiology work-up in progress - Check lactic acid x2, ordered urine culture, COVID/influenza A/influenza B PCR have been ordered and pending collection - Blood cultures x2 ordered and pending collection - Status post ceftriaxone 1 g IV per EDP - If patient's lactic acid is elevated, we will administer ceftriaxone 2 g IV and order blood cultures x2 as appropriate - Admit to telemetry medical, inpatient  Paroxysmal atrial fibrillation (HCC) - On heparin gtt.  Morbid obesity ([place]) -  This complicates overall care and prognosis.    CAD (coronary artery disease) - Atorvastatin 40 mg daily resumed  UTI (urinary tract infection) - Continue with  ceftriaxone 2 g daily  Elevated troponin - Patient had positive high sensitive troponin, positive delta - Troponin is 72 and increased to 105 - Low clinical suspicion for ACS at this time given that there is no ischemic changes on EKG - I suspect this is secondary to sepsis/demand ischemia - Heparin GTT per pharmacy initiated  Stage 3b chronic kidney disease (CKD) ([place]) - At baseline  Cellulitis Bilateral lower extremity right more than the left - Continue with ceftriaxone 2 g daily - Urine culture and blood cultures x2 have been ordered      OSA on CPAP - CPAP nightly ordered  Chart reviewed.   DVT prophylaxis: Heparin GTT Code Status: Full code Diet: Heart healthy/carb modified Family Communication:  Disposition Plan: Pending clinical course Consults called: None at this time Admission status: Telemetry medical, inpatient  Past Medical History:  Diagnosis Date   Acute kidney failure ([place])    Acute respiratory failure with hypoxia ([place]) 09/2021   Bacteremia    CAD (coronary artery disease)    Chronic kidney disease    Diabetes mellitus without complication (HCC)    Diastolic CHF ([place])    Dysphagia    Dysrhythmia    History of kidney stones    Hyperlipemia    Hypertension    Lymphedema    MI (myocardial infarction) ([place])    Morbid obesity (HCC)    OSA (obstructive sleep apnea)    Presence of permanent cardiac pacemaker    Sepsis due to Escherichia coli with acute hypoxic respiratory failure (HCC)    Severe aortic stenosis    Wounds, multiple    Past Surgical History:  Procedure Laterality Date   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 She has never used smokeless tobacco. She reports that she does not drink alcohol and does not use drugs.  Allergies  Allergen Reactions   Tetanus Toxoid, Adsorbed Swelling   Tetanus Toxoids Swelling   Lisinopril Rash    Other reaction(s): Unknown   Niacin Rash    Other reaction(s): Unknown   Sitagliptin Rash    Other reaction(s): Unknown   Sulfamethoxazole-Trimethoprim Rash    Other reaction(s): Unknown   Family History  Problem Relation Age of Onset   Cancer Mother    Heart failure Father    Family history: Family history reviewed and not pertinent  Prior to Admission medications   Medication Sig Start Date End Date Taking? Authorizing Provider  acetaminophen (TYLENOL) 325 MG tablet Take 2 tablets (650 mg total) by mouth every 6 (six) hours as needed for mild pain (or Fever >/= 101). Patient taking differently: Take 650-975 mg by mouth See admin instructions. Take 975 mg at bedtime, may take 650 mg every 6 hours as needed for pain 11/05/17   Loletha Grayer, MD  Amino Acids-Protein Hydrolys (FEEDING SUPPLEMENT, PRO-STAT SUGAR FREE 64,) LIQD Take  30 mLs by mouth 2 (two) times daily.    [provider]  apixaban (ELIQUIS) 5 MG TABS tablet Take 1 tablet (5 mg total) by mouth 2 (two) times daily. 09/06/21   Annita Brod, MD  atorvastatin (LIPITOR) 40 MG tablet Take 1 tablet (40 mg total) by mouth daily at 6 PM. 09/06/21   Annita Brod, MD  barrier cream (NON-SPECIFIED) CREA Apply 1 application. topically 3 (three) times daily. Every shift    [provider]  cefdinir (OMNICEF) 300 MG capsule Take by mouth. Patient not taking: Reported on 04/28/2022 01/10/22   [provider]  cephALEXin (KEFLEX) 500 MG capsule Take 1 capsule (500 mg total) by mouth 3 (three)  times daily. Patient not taking: Reported on 04/28/2022 04/06/22   Carrie Mew, MD  Cholecalciferol 50 MCG (2000 UT) CAPS Take 1 capsule (2,000 Units total) by mouth daily. 09/06/21   Annita Brod, MD  clonazePAM (KLONOPIN) 0.5 MG tablet Take 0.5 mg by mouth 2 (two) times daily as needed. 03/10/22   [provider]  docusate sodium (COLACE) 100 MG capsule Take 1 capsule (100 mg total) by mouth daily. Patient taking differently: Take 100 mg by mouth 2 (two) times daily. 09/06/21   Annita Brod, MD  fenofibrate (TRICOR) 145 MG tablet Take 1 tablet (145 mg total) by mouth daily. 09/06/21   Annita Brod, MD  furosemide (LASIX) 20 MG tablet Take 20 mg by mouth daily. 04/09/22   [provider]  insulin glargine-yfgn (SEMGLEE) 100 UNIT/ML injection Inject 40 Units into the skin daily. Patient not taking: Reported on 04/28/2022    [provider]  LEVEMIR FLEXPEN 100 UNIT/ML FlexPen Inject 65 Units into the skin daily. 04/16/22   [provider]  lido-EPINEPHrine-Tetracaine (L.E.T.) 4-0.05-0.5 % GEL Apply 1 mL topically every hour as needed (leg ulcer pain). 03/23/22   Naaman Plummer, MD  Multiple Vitamin (MULTIVITAMIN WITH MINERALS) TABS tablet Take 1 tablet by mouth daily. 09/07/21   Annita Brod, MD  MYRBETRIQ 50 MG TB24 tablet Take 50 mg by mouth daily. 04/09/22   [provider]  NOVOLOG FLEXPEN 100 UNIT/ML FlexPen Inject into the skin. Patient not taking: Reported on 04/28/2022 04/16/22   [provider]  nystatin (MYCOSTATIN/NYSTOP) powder SMARTSIG:1 Application Topical 2-3 Times Daily 03/01/22   [provider]  omeprazole (PRILOSEC) 20 MG capsule Take 20 mg by mouth daily. 04/09/22   [provider]  ondansetron (ZOFRAN) 4 MG tablet Take 4 mg by mouth every 8 (eight) hours as needed for nausea or vomiting.    [provider]  polyethylene glycol (MIRALAX / GLYCOLAX) 17 g packet Take 17 g by mouth  daily as needed for mild constipation. Patient taking differently: Take 17 g by mouth daily. 09/06/21   Annita Brod, MD   Physical Exam: Vitals:   04/29/22 1300 04/29/22 1535 04/29/22 1626 04/29/22 1848  BP: 132/71 (!) 148/133 107/84 132/71  Pulse: (!) 59 61 (!) 59 63  Resp: (!) 23  16   Temp:  97.6 F (36.4 C) 97.9 F (36.6 C)   TempSrc:      SpO2: 92% 92% 92%   Weight:      Height:       Constitutional: appears older than chronological age, frail, chronically ill, NAD, calm, comfortable Eyes: PERRL, lids and conjunctivae normal ENMT: Mucous membranes are moist. Posterior pharynx clear of any exudate or lesions. Age-appropriate dentition. Hearing appropriate Neck: normal, supple,  no masses, no thyromegaly Respiratory: clear to auscultation bilaterally, no wheezing, no crackles. Normal respiratory effort. No accessory muscle use.  Cardiovascular: Regular rate and rhythm, no murmurs / rubs / gallops. No extremity edema. 2+ pedal pulses. No carotid bruits.  Abdomen: Morbidly obese abdomen, no tenderness, no masses palpated, no hepatosplenomegaly. Bowel sounds positive.  Musculoskeletal: no clubbing / cyanosis. No joint deformity upper and lower extremities. Good ROM, no contractures, no atrophy. Normal muscle tone.  Skin: Bilateral lower skin changes with the right consistent with cellulitis as below     Neurologic: Sensation intact. Strength 5/5 in all 4.  Psychiatric: Normal judgment and insight. Alert and oriented x 3. Normal mood.   EKG: independently reviewed, showing junctional rhythm with a rate of 60, QTc 492  Chest x-ray on Admission: I personally reviewed and I agree with radiologist reading as below.  CT Head Wo Contrast  Result Date: 04/29/2022 CLINICAL DATA:  Found sitting on the floor. Weakness. EXAM: CT HEAD WITHOUT CONTRAST CT CERVICAL SPINE WITHOUT CONTRAST TECHNIQUE: Multidetector CT imaging of the head and cervical spine was performed following the  standard protocol without intravenous contrast. Multiplanar CT image reconstructions of the cervical spine were also generated. RADIATION DOSE REDUCTION: This exam was performed according to the departmental dose-optimization program which includes automated exposure control, adjustment of the mA and/or kV according to patient size and/or use of iterative reconstruction technique. COMPARISON:  Head and cervical spine CT dated 04/28/2022. FINDINGS: CT HEAD FINDINGS Brain: Stable mildly enlarged ventricles and cortical sulci. Stable minimal patchy white matter low density in both cerebral hemispheres. No intracranial hemorrhage, mass lesion or CT evidence of acute infarction. Vascular: No hyperdense vessel or unexpected calcification. Skull: Normal. Negative for fracture or focal lesion. Sinuses/Orbits: Unremarkable. Other: Deviation of the midportion of the nasal septum to the right. CT CERVICAL SPINE FINDINGS Alignment: Straightening of the normal cervical lordosis with mild improvement. No subluxations. Skull base and vertebrae: No acute fracture. No primary bone lesion or focal pathologic process. Soft tissues and spinal canal: No prevertebral fluid or swelling. No visible canal hematoma. Disc levels:  Previously noted degenerative changes. Upper chest: Clear lung apices. Other: Previously noted 1.8 cm mass in the left parotid gland. This contains a small amount calcification IMPRESSION: 1. No skull fracture or intracranial hemorrhage. 2. No cervical spine fracture or subluxation. 3. 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 Differential considerations and ENT consultation as previously described. 4. Stable mild diffuse cerebral and cerebellar atrophy and minimal chronic small vessel white matter ischemic changes in both cerebral hemispheres. 5. Previously noted degenerative changes of the cervical spine. Electronically Signed   By: Claudie Revering M.D.   On: 04/29/2022 12:55   CT Cervical Spine Wo  Contrast  Result Date: 04/29/2022 CLINICAL DATA:  Found sitting on the floor. Weakness. EXAM: CT HEAD WITHOUT CONTRAST CT CERVICAL SPINE WITHOUT CONTRAST TECHNIQUE: Multidetector CT imaging of the head and cervical spine was performed following the standard protocol without intravenous contrast. Multiplanar CT image reconstructions of the cervical spine were also generated. RADIATION DOSE REDUCTION: This exam was performed according to the departmental dose-optimization program which includes automated exposure control, adjustment of the mA and/or kV according to patient size and/or use of iterative reconstruction technique. COMPARISON:  Head and cervical spine CT dated 04/28/2022. FINDINGS: CT HEAD FINDINGS Brain: Stable mildly enlarged ventricles and cortical sulci. Stable minimal patchy white matter low density in both cerebral hemispheres. No intracranial hemorrhage, mass lesion or CT evidence of  acute infarction. Vascular: No hyperdense vessel or unexpected calcification. Skull: Normal. Negative for fracture or focal lesion. Sinuses/Orbits: Unremarkable. Other: Deviation of the midportion of the nasal septum to the right. CT CERVICAL SPINE FINDINGS Alignment: Straightening of the normal cervical lordosis with mild improvement. No subluxations. Skull base and vertebrae: No acute fracture. No primary bone lesion or focal pathologic process. Soft tissues and spinal canal: No prevertebral fluid or swelling. No visible canal hematoma. Disc levels:  Previously noted degenerative changes. Upper chest: Clear lung apices. Other: Previously noted 1.8 cm mass in the left parotid gland. This contains a small amount calcification IMPRESSION: 1. No skull fracture or intracranial hemorrhage. 2. No cervical spine fracture or subluxation. 3. 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 Differential considerations and ENT consultation as previously described. 4. Stable mild diffuse cerebral and cerebellar atrophy  and minimal chronic small vessel white matter ischemic changes in both cerebral hemispheres. 5. Previously noted degenerative changes of the cervical spine. Electronically Signed   By: Claudie Revering M.D.   On: 04/29/2022 12:55   DG Chest Portable 1 View  Result Date: 04/29/2022 CLINICAL DATA:  Weakness.  Altered mental status. EXAM: PORTABLE CHEST 1 VIEW COMPARISON:  04/28/2022 FINDINGS: The heart size and mediastinal contours are within normal limits. Pacemaker remains in appropriate position. Prior TAVR again noted. Low lung volumes noted. Both lungs are clear. The visualized skeletal structures are unremarkable. IMPRESSION: Low lung volumes.  No active disease. Electronically Signed   By: Marlaine Hind M.D.   On: 04/29/2022 12:27   CT Head Wo Contrast  Result Date: 04/28/2022 CLINICAL DATA:  Provided history: Neck trauma. Head trauma, minor. Additional history provided: Fall, unwitnessed, on blood thinners. EXAM: CT HEAD WITHOUT CONTRAST CT CERVICAL SPINE WITHOUT CONTRAST TECHNIQUE: Multidetector CT imaging of the head and cervical spine was performed following the standard protocol without intravenous contrast. Multiplanar CT image reconstructions of the cervical spine were also generated. RADIATION DOSE REDUCTION: This exam was performed according to the departmental dose-optimization program which includes automated exposure control, adjustment of the mA and/or kV according to patient size and/or use of iterative reconstruction technique. COMPARISON:  Head CT 08/31/2021. FINDINGS: CT HEAD FINDINGS Brain: Mild generalized cerebral atrophy. Minimal patchy and ill-defined hypoattenuation within the cerebral white matter, nonspecific but compatible with chronic small vessel ischemic disease. Partially empty sella turcica. There is no acute intracranial hemorrhage. No demarcated cortical infarct. No extra-axial fluid collection. No evidence of an intracranial mass. No midline shift. Vascular: No hyperdense  vessel. Atherosclerotic calcifications. Skull: No fracture or aggressive osseous lesion. Sinuses/Orbits: No mass or acute finding within the imaged orbits. Minimal mucosal thickening within the bilateral ethmoid, right sphenoid and bilateral maxillary sinuses. Other: 18 x 14 mm ovoid lesion within the left parotid gland (for instance as seen on series 2, image 6) (series 5, image 57). This may reflect an abnormal, enlarged lymph node or primary parotid neoplasm. CT CERVICAL SPINE FINDINGS Alignment: Straightening of the expected cervical lordosis. No significant spondylolisthesis. Skull base and vertebrae: The basion-dental and atlanto-dental intervals are maintained.No evidence of acute fracture to the cervical spine. Facet joint ankylosis bilaterally at C2-C3 Soft tissues and spinal canal: No prevertebral fluid or swelling. No visible canal hematoma. Disc levels: Cervical spondylosis with multilevel disc space narrowing, disc bulges/central disc protrusions, posterior disc osteophyte complexes, uncovertebral hypertrophy and facet arthrosis. Disc space narrowing is greatest at C5-C6 and C6-C7 (advanced at these levels). No appreciable high-grade spinal canal stenosis. Multilevel bony neural foraminal  narrowing. Upper chest: No consolidation within the imaged lung apices. No visible pneumothorax. IMPRESSION: CT head: 1. No evidence of acute intracranial abnormality. 2. Minimal chronic small vessel ischemic changes within the cerebral white matter. 3. Mild generalized cerebral atrophy. 4. Minimal paranasal sinus mucosal thickening. 5. 18 x 14 mm ovoid lesion within the left parotid gland. This may reflect an abnormal, enlarged lymph node or primary parotid neoplasm. ENT consultation is recommended. CT cervical spine: 1. No evidence of acute fracture to the cervical spine. 2. Nonspecific straightening of the expected cervical lordosis. 3. Cervical spondylosis, as described. 4. Facet joint ankylosis bilaterally at  C2-C3. Electronically Signed   By: Kellie Simmering D.O.   On: 04/28/2022 15:31   CT Cervical Spine Wo Contrast  Result Date: 04/28/2022 CLINICAL DATA:  Provided history: Neck trauma. Head trauma, minor. Additional history provided: Fall, unwitnessed, on blood thinners. EXAM: CT HEAD WITHOUT CONTRAST CT CERVICAL SPINE WITHOUT CONTRAST TECHNIQUE: Multidetector CT imaging of the head and cervical spine was performed following the standard protocol without intravenous contrast. Multiplanar CT image reconstructions of the cervical spine were also generated. RADIATION DOSE REDUCTION: This exam was performed according to the departmental dose-optimization program which includes automated exposure control, adjustment of the mA and/or kV according to patient size and/or use of iterative reconstruction technique. COMPARISON:  Head CT 08/31/2021. FINDINGS: CT HEAD FINDINGS Brain: Mild generalized cerebral atrophy. Minimal patchy and ill-defined hypoattenuation within the cerebral white matter, nonspecific but compatible with chronic small vessel ischemic disease. Partially empty sella turcica. There is no acute intracranial hemorrhage. No demarcated cortical infarct. No extra-axial fluid collection. No evidence of an intracranial mass. No midline shift. Vascular: No hyperdense vessel. Atherosclerotic calcifications. Skull: No fracture or aggressive osseous lesion. Sinuses/Orbits: No mass or acute finding within the imaged orbits. Minimal mucosal thickening within the bilateral ethmoid, right sphenoid and bilateral maxillary sinuses. Other: 18 x 14 mm ovoid lesion within the left parotid gland (for instance as seen on series 2, image 6) (series 5, image 57). This may reflect an abnormal, enlarged lymph node or primary parotid neoplasm. CT CERVICAL SPINE FINDINGS Alignment: Straightening of the expected cervical lordosis. No significant spondylolisthesis. Skull base and vertebrae: The basion-dental and atlanto-dental intervals  are maintained.No evidence of acute fracture to the cervical spine. Facet joint ankylosis bilaterally at C2-C3 Soft tissues and spinal canal: No prevertebral fluid or swelling. No visible canal hematoma. Disc levels: Cervical spondylosis with multilevel disc space narrowing, disc bulges/central disc protrusions, posterior disc osteophyte complexes, uncovertebral hypertrophy and facet arthrosis. Disc space narrowing is greatest at C5-C6 and C6-C7 (advanced at these levels). No appreciable high-grade spinal canal stenosis. Multilevel bony neural foraminal narrowing. Upper chest: No consolidation within the imaged lung apices. No visible pneumothorax. IMPRESSION: CT head: 1. No evidence of acute intracranial abnormality. 2. Minimal chronic small vessel ischemic changes within the cerebral white matter. 3. Mild generalized cerebral atrophy. 4. Minimal paranasal sinus mucosal thickening. 5. 18 x 14 mm ovoid lesion within the left parotid gland. This may reflect an abnormal, enlarged lymph node or primary parotid neoplasm. ENT consultation is recommended. CT cervical spine: 1. No evidence of acute fracture to the cervical spine. 2. Nonspecific straightening of the expected cervical lordosis. 3. Cervical spondylosis, as described. 4. Facet joint ankylosis bilaterally at C2-C3. Electronically Signed   By: Kellie Simmering D.O.   On: 04/28/2022 15:31   DG Chest Portable 1 View  Result Date: 04/28/2022 CLINICAL DATA:  Trauma, fall EXAM: PORTABLE CHEST 1 VIEW COMPARISON:  04/06/2022 FINDINGS: Transverse diameter of heart is increased. There is prosthetic stent in aortic valve. Central pulmonary vessels are prominent. There is slight prominence of interstitial markings in parahilar regions and lower lung fields. There is no focal consolidation. There is no significant pleural effusion or pneumothorax. IMPRESSION: Cardiomegaly. Central pulmonary vessels are prominent suggesting CHF. There is increase in interstitial markings in  parahilar regions and lower lung fields suggesting mild interstitial edema or interstitial pneumonia or scarring. Electronically Signed   By: Elmer Picker M.D.   On: 04/28/2022 15:02   DG Pelvis 1-2 Views  Result Date: 04/28/2022 CLINICAL DATA:  Trauma, fall EXAM: PELVIS - 1-2 VIEW COMPARISON:  CT done on 08/25/2021 FINDINGS: Image is technically less than optimal related to patient's body habitus. No displaced fracture or dislocation is seen. Degenerative changes are noted in visualized lower lumbar spine. IMPRESSION: No displaced fracture or dislocation is seen in the limited AP view of pelvis. Electronically Signed   By: Elmer Picker M.D.   On: 04/28/2022 15:00    Labs on Admission: I have personally reviewed following labs  CBC: Recent Labs  Lab 04/28/22 1514 04/29/22 1134  WBC 7.6 7.7  NEUTROABS 6.4 6.5  HGB 11.7* 11.6*  HCT 39.3 39.1  MCV 86.2 86.3  PLT 228 096   Basic Metabolic Panel: Recent Labs  Lab 04/28/22 1514 04/29/22 1134 04/29/22 1401  NA 139 145  --   K 3.8 4.2  --   CL 104 106  --   CO2 27 29  --   GLUCOSE 140* 130*  --   BUN 39* 42*  --   CREATININE 1.66* 1.75*  --   CALCIUM 8.5* 8.9  --   MG 2.1  --  2.2  PHOS  --   --  3.5   GFR: Estimated Creatinine Clearance: 42.2 mL/min (A) (by C-G formula based on SCr of 1.75 mg/dL (H)).  Liver Function Tests: Recent Labs  Lab 04/28/22 1514 04/29/22 1134  AST 39 46*  ALT 25 25  ALKPHOS 63 59  BILITOT 1.0 1.4*  PROT 7.0 7.5  ALBUMIN 3.2* 3.3*   Urine analysis:    Component Value Date/Time   COLORURINE YELLOW (A) 04/28/2022 1607   APPEARANCEUR HAZY (A) 04/28/2022 1607   APPEARANCEUR Clear 02/05/2020 1143   LABSPEC 1.017 04/28/2022 1607   PHURINE 5.0 04/28/2022 1607   GLUCOSEU NEGATIVE 04/28/2022 1607   HGBUR NEGATIVE 04/28/2022 1607   BILIRUBINUR NEGATIVE 04/28/2022 1607   BILIRUBINUR Negative 02/05/2020 1143   KETONESUR NEGATIVE 04/28/2022 1607   PROTEINUR 30 (A) 04/28/2022 1607    NITRITE NEGATIVE 04/28/2022 1607   LEUKOCYTESUR SMALL (A) 04/28/2022 1607   Dr. Tobie Poet [facility]  If 7PM-7AM, please contact overnight-coverage provider If 7AM-7PM, please contact day coverage provider [email or web address]  04/29/2022, 7:05 PM

## 2022-04-29 NOTE — ED Provider Notes (Signed)
[facility] Provider Note    Event Date/Time   First MD Initiated Contact with Patient 04/29/22 1127     (approximate)   History   Chief Complaint Fall   HPI  [name] is a 71 y.o. female with past medical history of hypertension, diabetes, CAD, CHF, atrial fibrillation on Eliquis, and CKD who presents to the ED following fall.  Patient reports that she has been feeling weaker than usual over the past 24 hours, ended up slipping and falling out of bed earlier today.  She reports landing on her backside and denies hitting her head or losing consciousness.  She was seen in the ED yesterday for similar presentation following fall, was sent back to her nursing facility following reassuring work-up at that time.  Patient denies any fevers, cough, chest pain, shortness of breath, abdominal pain, nausea, vomiting, diarrhea, or dysuria.     Physical Exam   Triage Vital Signs: ED Triage Vitals  Enc Vitals Group     BP 04/29/22 1132 (!) 147/88     Pulse Rate 04/29/22 1132 63     Resp 04/29/22 1132 (!) 26     Temp 04/29/22 1135 (!) 97.3 F (36.3 C)     Temp Source 04/29/22 1135 Oral     SpO2 04/29/22 1132 100 %     Weight 04/29/22 1131 (!) 310 lb 13.6 oz (141 kg)     Height 04/29/22 1131 5\' 5"  (1.651 m)     Head Circumference --      [facility] Flow --      Pain Score 04/29/22 1130 8     Pain Loc --      Pain Edu? --      Excl. in [place]? --     Most recent vital signs: Vitals:   04/29/22 1245 04/29/22 1300  BP:  132/71  Pulse: 68 (!) 59  Resp: 19 (!) 23  Temp:    SpO2: 93% 92%    Constitutional: Alert and oriented to person and place, but not time or situation. Eyes: Conjunctivae are normal. Head: Atraumatic. Nose: No congestion/rhinnorhea. Mouth/Throat: Mucous membranes are moist.  Neck: Midline cervical spine tenderness to palpation noted. Cardiovascular: Normal rate, regular rhythm. Grossly normal heart sounds.  2+ radial pulses  bilaterally. Respiratory: Normal respiratory effort.  No retractions. Lungs CTAB. Gastrointestinal: Soft and nontender. No distention. Musculoskeletal: No lower extremity tenderness nor edema.  Chronic appearing wounds to bilateral lower extremities with no associated erythema or warmth.  No upper extremity bony tenderness to palpation. Neurologic:  Normal speech and language. No gross focal neurologic deficits are appreciated.    ED Results / Procedures / Treatments   Labs (all labs ordered are listed, but only abnormal results are displayed) Labs Reviewed  CBC WITH DIFFERENTIAL/PLATELET - Abnormal; Notable for the following components:      Result Value   Hemoglobin 11.6 (*)    MCH 25.6 (*)    MCHC 29.7 (*)    RDW 16.0 (*)    Lymphs Abs 0.5 (*)    All other components within normal limits  COMPREHENSIVE METABOLIC PANEL - Abnormal; Notable for the following components:   Glucose, Bld 130 (*)    BUN 42 (*)    Creatinine, Ser 1.75 (*)    Albumin 3.3 (*)    AST 46 (*)    Total Bilirubin 1.4 (*)    GFR, Estimated 31 (*)    All other components within normal limits  TROPONIN  I (HIGH SENSITIVITY) - Abnormal; Notable for the following components:   Troponin I (High Sensitivity) 72 (*)    All other components within normal limits  TROPONIN I (HIGH SENSITIVITY) - Abnormal; Notable for the following components:   Troponin I (High Sensitivity) 105 (*)    All other components within normal limits  URINE CULTURE  RESP PANEL BY RT-PCR (FLU A&B, COVID) ARPGX2  URINALYSIS, ROUTINE W REFLEX MICROSCOPIC  PHOSPHORUS  MAGNESIUM  PROCALCITONIN  VITAMIN B12     EKG  ED ECG REPORT I, Blake Divine, the attending physician, personally viewed and interpreted this ECG.   Date: 04/29/2022  EKG Time: 12:04  Rate: 60  Rhythm: normal sinus rhythm  Axis: Normal  Intervals:nonspecific intraventricular conduction delay  ST&T Change: None  RADIOLOGY CT head reviewed and interpreted by me  with no hemorrhage, mass, or midline shift.  CT cervical spine negative for fracture or dislocation.  PROCEDURES:  Critical Care performed: No  Procedures   MEDICATIONS ORDERED IN ED: Medications  cefTRIAXone (ROCEPHIN) 1 g in sodium chloride 0.9 % 100 mL IVPB (has no administration in time range)  atorvastatin (LIPITOR) tablet 40 mg (has no administration in time range)  acetaminophen (TYLENOL) tablet 650 mg (has no administration in time range)    Or  acetaminophen (TYLENOL) suppository 650 mg (has no administration in time range)  ondansetron (ZOFRAN) tablet 4 mg (has no administration in time range)    Or  ondansetron (ZOFRAN) injection 4 mg (has no administration in time range)  senna-docusate (Senokot-S) tablet 1 tablet (has no administration in time range)     IMPRESSION / MDM / ASSESSMENT AND PLAN / ED COURSE  I reviewed the triage vital signs and the nursing notes.                              71 y.o. female with past medical history of hypertension, diabetes, CAD, CHF, atrial fibrillation on Eliquis, and CKD who presents to the ED following a slip and fall out of bed, when she reports she did not hit her head or lose consciousness.  Patient's presentation is most consistent with acute presentation with potential threat to life or bodily function.  Differential diagnosis includes, but is not limited to, intracranial injury, cervical spine injury, extremity injury, AKI, electrolyte abnormality, arrhythmia, ACS, pneumonia, CHF, UTI.  Patient chronically ill but nontoxic-appearing, vital signs remarkable for tachypnea but otherwise reassuring.  She has no focal neurologic deficits on exam and no obvious signs of trauma to her head, although she has midline cervical spine tenderness and is anticoagulated, will check CT head and cervical spine.  Patient deals with chronic pain to her bilateral lower extremities, no signs of infection today and no bony tenderness beyond her  usual.  Reviewing her work-up from yesterday, it is possible that she had a UTI, urine was not sent for culture at that time.  We will repeat urinalysis and send for culture, repeat labs and chest x-ray.  She did have some pulmonary edema noted on chest x-ray yesterday, was given diuretic at that time and we will check for improvement.  CT head and cervical spine are negative for acute process, no evidence of traumatic injury.  Chest x-ray is unremarkable, pulmonary edema seen yesterday seems to have improved.  Renal function stable compared to previous, no significant electrolyte abnormality noted and no significant anemia or leukocytosis noted.  Patient does have elevated troponin similar  to yesterday, however uptrending on recheck.  Patient denies any chest pain or shortness of breath and overall low suspicion for ACS or PE at this time, will continue to trend troponin.  Reviewing urinalysis from yesterday, it seems likely that patient's symptoms are due to UTI and we will treat with IV Rocephin.  Case discussed with hospitalist for admission.      FINAL CLINICAL IMPRESSION(S) / ED DIAGNOSES   Final diagnoses:  Altered mental status, unspecified altered mental status type  Acute cystitis without hematuria  Multiple falls     Rx / DC Orders   ED Discharge Orders     None        Note:  This document was prepared using Dragon voice recognition software and may include unintentional dictation errors.   Blake Divine, MD 04/29/22 1356

## 2022-04-29 NOTE — ED Triage Notes (Signed)
  Pt to ED from [facility], [place] Seen here yesterday Found sitting on floor, per facility pt is bedbound but often will sit at edge of bed. Pt slid to floor then laid down (did not hit head) Pt has "weakness". Facility wants pt checked for UTI and said to EMS that pt did not get proper workup here yesterday  Pt has weeping cellulitis to both lower legs, this is chronic  Pt appears tachypneic  CBG 164, HR 68, 96% on 2L, does not usually wear oxygen 88% on ra PER ems  Pt is alert, oriented. EDP at bedside

## 2022-04-29 NOTE — Assessment & Plan Note (Signed)
HR controlled. Stop heparin drip. Resume Eliquis. Appears not to be on rate control agent.

## 2022-04-29 NOTE — ED Notes (Signed)
Pt in CT.

## 2022-04-29 NOTE — Assessment & Plan Note (Signed)
-   CPAP nightly ordered 

## 2022-04-29 NOTE — Consult Note (Signed)
ANTICOAGULATION CONSULT NOTE - Consult  Pharmacy Consult for heparin gtt Indication: chest pain/ACS  Allergies  Allergen Reactions   Tetanus Toxoid, Adsorbed Swelling   Tetanus Toxoids Swelling   Lisinopril Rash    Other reaction(s): Unknown   Niacin Rash    Other reaction(s): Unknown   Sitagliptin Rash    Other reaction(s): Unknown   Sulfamethoxazole-Trimethoprim Rash    Other reaction(s): Unknown    Patient Measurements: Height: 5\' 5"  (165.1 cm) Weight: (!) 141 kg (310 lb 13.6 oz) IBW/kg (Calculated) : 57 Heparin Dosing Weight: 92.2 kg  Vital Signs: Temp: 97.3 F (36.3 C) (11/04 1135) Temp Source: Oral (11/04 1135) BP: 132/71 (11/04 1300) Pulse Rate: 59 (11/04 1300)  Labs: Recent Labs    04/28/22 1514 04/28/22 1813 04/29/22 1134 04/29/22 1320  HGB 11.7*  --  11.6*  --   HCT 39.3  --  39.1  --   PLT 228  --  226  --   CREATININE 1.66*  --  1.75*  --   TROPONINIHS 61* 63* 72* 105*   Estimated Creatinine Clearance: 42.2 mL/min (A) (by C-G formula based on SCr of 1.75 mg/dL (H)). Heparin Dosing Weight: 92.2 kg  Medications:  PTA: Eliquis 5mg  BID (last dose unable to confirm with pt; admitted ~1130 11/04) Inpatient:  +heparin gtt  Assessment: 71yo F w/ h/o hypertension, diabetes, CAD, CHF, atrial fibrillation on Eliquis, and CKD who presents to the ED following fall. Pharmacy consulted for transition of home Eliquis to heparin gtt.  Date Time aPTT/HL Rate/Comment       Baseline Labs: aPTT - ordered; INR - ordered Hgb - 11.6; Plts - 226  Goal of Therapy:  Heparin level 0.3-0.7 units/ml aPTT 66-102 seconds Monitor platelets by anticoagulation protocol: Yes   Plan:  Last dose of eliquis 5mg  (for Afib) at unk time PTA (admitted on 11/03 1130) Give 4000 units bolus x1; then start heparin infusion at 1300 units/hr. Check aPTT/Anti-Xa level in 8 hours and daily once consecutively therapeutic.  Titrate by aPTT's until lab correlation is noted, then titrate  by anti-xa alone. Continue to monitor H&H and platelets daily while on heparin gtt.  Shanon Brow Yasmin Bronaugh 04/29/2022,2:19 PM

## 2022-04-29 NOTE — Assessment & Plan Note (Signed)
At baseline 

## 2022-04-29 NOTE — Progress Notes (Signed)
When approaching pt with questions to address admission profile pt turned her head in opposite direction and closed her eyes. Unable to complete at this time.

## 2022-04-29 NOTE — Assessment & Plan Note (Signed)
Initial troponin is 72 >> 105 >> 56.  Due to demand ischemia in setting of acute illness. No chest pain or acute ischemic EKG changes. Started on empiric heparin on admission. --Stop heparin --Resume Eliquis

## 2022-04-29 NOTE — ED Notes (Signed)
Dr Charna Archer informed face to face second trop is 105 up from 53.

## 2022-04-29 NOTE — Assessment & Plan Note (Addendum)
Body mass index is 51.73 kg/m. Complicates overall care and prognosis.  Recommend lifestyle modifications including physical activity and diet for weight loss and overall long-term health.

## 2022-04-30 DIAGNOSIS — I872 Venous insufficiency (chronic) (peripheral): Secondary | ICD-10-CM | POA: Diagnosis not present

## 2022-04-30 DIAGNOSIS — G9341 Metabolic encephalopathy: Secondary | ICD-10-CM | POA: Diagnosis not present

## 2022-04-30 DIAGNOSIS — K219 Gastro-esophageal reflux disease without esophagitis: Secondary | ICD-10-CM | POA: Insufficient documentation

## 2022-04-30 DIAGNOSIS — N3000 Acute cystitis without hematuria: Secondary | ICD-10-CM

## 2022-04-30 DIAGNOSIS — B372 Candidiasis of skin and nail: Secondary | ICD-10-CM | POA: Diagnosis present

## 2022-04-30 DIAGNOSIS — I480 Paroxysmal atrial fibrillation: Secondary | ICD-10-CM

## 2022-04-30 LAB — HEPARIN LEVEL (UNFRACTIONATED): Heparin Unfractionated: >1.1 IU/mL — ABNORMAL HIGH (ref 0.30–0.70)

## 2022-04-30 LAB — BASIC METABOLIC PANEL
Anion gap: 10 (ref 5–15)
BUN: 40 mg/dL — ABNORMAL HIGH (ref 8–23)
CO2: 26 mmol/L (ref 22–32)
Calcium: 8.6 mg/dL — ABNORMAL LOW (ref 8.9–10.3)
Chloride: 108 mmol/L (ref 98–111)
Creatinine, Ser: 1.78 mg/dL — ABNORMAL HIGH (ref 0.44–1.00)
GFR, Estimated: 30 mL/min — ABNORMAL LOW (ref >60)
Glucose, Bld: 161 mg/dL — ABNORMAL HIGH (ref 70–99)
Potassium: 4 mmol/L (ref 3.5–5.1)
Sodium: 144 mmol/L (ref 135–145)

## 2022-04-30 LAB — APTT
aPTT: 65 s — ABNORMAL HIGH (ref 24–36)
aPTT: 30 seconds (ref 24–36)

## 2022-04-30 LAB — CBC
HCT: 35.4 % — ABNORMAL LOW (ref 36.0–46.0)
Hemoglobin: 10.6 g/dL — ABNORMAL LOW (ref 12.0–15.0)
MCH: 25.5 pg — ABNORMAL LOW (ref 26.0–34.0)
MCHC: 29.9 g/dL — ABNORMAL LOW (ref 30.0–36.0)
MCV: 85.3 fL (ref 80.0–100.0)
Platelets: 214 10*3/uL (ref 150–400)
RBC: 4.15 MIL/uL (ref 3.87–5.11)
RDW: 16 % — ABNORMAL HIGH (ref 11.5–15.5)
WBC: 6.6 10*3/uL (ref 4.0–10.5)
nRBC: 0 % (ref 0.0–0.2)

## 2022-04-30 LAB — GLUCOSE, CAPILLARY
Glucose-Capillary: 158 mg/dL — ABNORMAL HIGH (ref 70–99)
Glucose-Capillary: 191 mg/dL — ABNORMAL HIGH (ref 70–99)
Glucose-Capillary: 183 mg/dL — ABNORMAL HIGH (ref 70–99)
Glucose-Capillary: 212 mg/dL — ABNORMAL HIGH (ref 70–99)

## 2022-04-30 LAB — CLOSTRIDIUM DIFFICILE BY PCR, REFLEXED: Toxigenic C. Difficile by PCR: POSITIVE — AB

## 2022-04-30 LAB — C DIFFICILE QUICK SCREEN W PCR REFLEX
C Diff antigen: POSITIVE — AB
C Diff toxin: NEGATIVE

## 2022-04-30 LAB — HEMOGLOBIN A1C
Hgb A1c MFr Bld: 6.8 % — ABNORMAL HIGH (ref 4.8–5.6)
Mean Plasma Glucose: 148.46 mg/dL

## 2022-04-30 LAB — TROPONIN I (HIGH SENSITIVITY): Troponin I (High Sensitivity): 56 ng/L — ABNORMAL HIGH (ref <18)

## 2022-04-30 MED ORDER — HEPARIN BOLUS VIA INFUSION
1400.0000 [IU] | Freq: Once | INTRAVENOUS | Status: AC
  Administered 2022-04-30: 1400 [IU] via INTRAVENOUS
  Filled 2022-04-30: qty 1400

## 2022-04-30 MED ORDER — APIXABAN 5 MG PO TABS
5.0000 mg | ORAL_TABLET | Freq: Two times a day (BID) | ORAL | Status: DC
  Administered 2022-04-30 – 2022-05-10 (×20): 5 mg via ORAL
  Filled 2022-04-30 (×20): qty 1

## 2022-04-30 MED ORDER — NYSTATIN 100000 UNIT/GM EX POWD
Freq: Three times a day (TID) | CUTANEOUS | Status: DC
  Administered 2022-05-03 – 2022-05-04 (×2): 1 via TOPICAL
  Filled 2022-04-30 (×2): qty 15

## 2022-04-30 NOTE — Assessment & Plan Note (Signed)
Presented with altered mental status, likely infection etiology from UTI.  No focal neurologic deficits. 11/5: mental status improved and appears baseline - Treat infection as outlined -- Delirium & fall precautions -- PT/OT -- Monitor for metabolic derangements

## 2022-04-30 NOTE — Consult Note (Signed)
ANTICOAGULATION CONSULT NOTE - Consult  Pharmacy Consult for heparin gtt Indication: chest pain/ACS  Allergies  Allergen Reactions   Tetanus Toxoid, Adsorbed Swelling   Tetanus Toxoids Swelling   Lisinopril Rash    Other reaction(s): Unknown   Niacin Rash    Other reaction(s): Unknown   Sitagliptin Rash    Other reaction(s): Unknown   Sulfamethoxazole-Trimethoprim Rash    Other reaction(s): Unknown    Patient Measurements: Height: 5\' 5"  (165.1 cm) Weight: (!) 141 kg (310 lb 13.6 oz) IBW/kg (Calculated) : 57 Heparin Dosing Weight: 92.2 kg  Vital Signs: Temp: 98 F (36.7 C) (11/05 0809) Temp Source: Oral (11/05 0509) BP: 150/69 (11/05 0809) Pulse Rate: 61 (11/05 0809)  Labs: Recent Labs    04/28/22 1514 04/28/22 1813 04/29/22 1134 04/29/22 1320 04/29/22 1548 04/29/22 2325 04/30/22 0647  HGB 11.7*  --  11.6*  --   --   --  10.6*  HCT 39.3  --  39.1  --   --   --  35.4*  PLT 228  --  226  --   --   --  214  APTT  --   --   --   --  >200* 60* 65*  LABPROT  --   --   --   --  20.3*  --   --   INR  --   --   --   --  1.8*  --   --   HEPARINUNFRC  --   --   --   --   --  >1.10* >1.10*  CREATININE 1.66*  --  1.75*  --   --   --  1.78*  TROPONINIHS 61* 63* 72* 105*  --   --   --     Estimated Creatinine Clearance: 41.5 mL/min (A) (by C-G formula based on SCr of 1.78 mg/dL (H)). Heparin Dosing Weight: 92.2 kg  Medications:  PTA: Eliquis 5mg  BID (last dose unable to confirm with pt; admitted ~1130 11/04) Inpatient:  +heparin gtt  Assessment: 71yo F w/ h/o hypertension, diabetes, CAD, CHF, atrial fibrillation on Eliquis, and CKD who presents to the ED following fall. Pharmacy consulted for transition of home Eliquis to heparin gtt.  Date Time aPTT/HL Rate/Comment 11/04 2325 60 / >1.1 aPTT subtherapeutic, inc to 1500 u/hr  11/5 0647 65 / >1.1 aPTT barely subtherapeutic      Baseline Labs: aPTT - ordered; INR - ordered Hgb - 11.6; Plts - 226  Goal of Therapy:   Heparin level 0.3-0.7 units/ml aPTT 66-102 seconds Monitor platelets by anticoagulation protocol: Yes   Plan:  Last dose of eliquis 5mg  (for Afib) at unk time PTA (admitted on 11/03 1130)  11/5  0647  aPTT=65 / HL >1.1  -aPTT barely subtherapeutic Increase heparin infusion to 1600 units/hr. Recheck aPTT in 8 hrs after rate change  Titrate by aPTT's until lab correlation is noted, then titrate by anti-xa alone. Continue to monitor H&H and platelets daily while on heparin gtt.  Chinita Greenland PharmD Clinical Pharmacist 04/30/2022

## 2022-04-30 NOTE — Progress Notes (Addendum)
Progress Note   Patient: [name] INO:[id number] DOB: 1950-12-29 DOA: 04/29/2022     1 DOS: the patient was seen and examined on 04/30/2022   Brief hospital course: [name] is a 71 year old female with history of hyperlipidemia, insulin-dependent diabetes mellitus, GERD, morbid obesity, mostly bedbound, cannot sit on edge of bed, presented to the ED on 04/29/2022 for evaluation of altered mental status, dysuria and concerns about her leg wounds and redness.  She also reported a productive cough for several months which she attributes to reflux.    Pt lives in [place] ([facility] since a rehab stay at [facility] this past summer.  She reports strength and mobility have recently declined again, and she's had several falls.  PT/OT evaluations ordered.   Evaluation revealed likely UTI, and there is concern for cellulitis related to her b/l leg wounds.  Started on empiric Rocephin pending urine culture results.      Assessment and Plan: * Acute metabolic encephalopathy Presented with altered mental status, likely infection etiology from UTI.  No focal neurologic deficits. 11/5: mental status improved and appears baseline - Treat infection as outlined -- Delirium & fall precautions -- PT/OT -- Monitor for metabolic derangements  Chronic systolic CHF (congestive heart failure) (HCC) Appears stable and overall euvolemic - no pedal or distal lower extremity edema. Echo from 08/27/21 -- EF 40-45%, indeterminate diastolic parameters, mild MR --Monitor volume status closely --Continue home Lasix 20 mg PO daily --Monitor renal function and electrolytes   Paroxysmal atrial fibrillation (HCC) HR controlled. Stop heparin drip. Resume Eliquis. Appears not to be on rate control agent.  Obstructive sleep apnea CPAP ordered  Morbid obesity ([place]) Body mass index is 51.73 kg/m. Complicates overall care and prognosis.  Recommend lifestyle modifications including physical activity and diet for  weight loss and overall long-term health.    CAD (coronary artery disease) - Atorvastatin 40 mg daily resumed  GERD (gastroesophageal reflux disease) Continue PPI  UTI (urinary tract infection) - Continue Rocephin -- Follow urine culture  Elevated troponin Initial troponin is 72 >> 105 >> 56.  Due to demand ischemia in setting of acute illness. No chest pain or acute ischemic EKG changes. Started on empiric heparin on admission. 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 - Continue Rocephin given UTI -- follow cultures Chronic bilateral lower extremity wounds --Continue wound care and close monitoring for signs of infection    OSA on CPAP - CPAP nightly ordered        Subjective: Patient was awake sitting up in bed finishing breakfast when seen on rounds this morning.  She denies having any chest pain or shortness of breath.  She does endorse some intermittent dysuria.  She reports the wounds on her feet and legs are chronic and related to blisters that ruptured in the setting of severe edema.  She was followed at the wound care clinic and swelling improved dramatically with leg wraps or Unna boots.  She spent some time at [facility] for [facility] in June or July and then went to live at the [facility] assisted living.  She is concerned about her worsening strength and mobility and reports having multiple falls.  Reports severe anxiety with attempting mobility due to fear of falling.  She is noted to have productive sounding cough a couple times during the encounter, she reports this is chronic and related to reflux  Physical Exam: Vitals:   04/30/22 0100 04/30/22 0509 04/30/22 0809 04/30/22 1151  BP:  135/67 (!)  150/69 (!) 141/57  Pulse:  (!) 49 61 61  Resp:  18 18 20   Temp:  98.4 F (36.9 C) 98 F (36.7 C) 98 F (36.7 C)  TempSrc:  Oral    SpO2: 92% 91% 93% 93%  Weight:      Height:       General exam: awake, alert, no acute distress morbidly obese HEENT: Wearing glasses, bilateral nystagmus noted intermittently, moist mucus membranes, hearing grossly normal  Respiratory system: CTAB minutes due to body habitus, no wheezes, rales or rhonchi, normal respiratory effort. Cardiovascular system: normal S1/S2, RRR, unable to visualize JVD, no pedal edema.   Gastrointestinal system: soft, nontender nondistended abdomen Central nervous system: A&O x. no gross focal neurologic deficits, normal speech Extremities: (See images from H&P).  Bilateral distal lower extremities are erythematous but without differential warmth on palpation, bilateral distal lower extremities with open ulcers that are not draining any purulent fluid Skin: dry, intact, normal temperature Psychiatry: normal mood, congruent affect, judgement and insight appear normal    Data Reviewed:  Notable labs: Creatinine 1.78 stable, glucose 161, BUN 40, calcium 8.6, troponin trended down from 105 down to 56, hemoglobin stable 10.6  Family Communication: None.  Disposition: Status is: Inpatient Remains inpatient appropriate because: Remains on IV antibiotics pending cultures and further clinical PruMyx.  PT OT evaluations pending for disposition planning.  Patient may require SNF/rehab placement.   Planned Discharge Destination:  Return to ALF versus SNF/rehab    Time spent: 45 minutes  Author: Ezekiel Slocumb, DO 04/30/2022 11:56 AM  For on call review [email or web address].

## 2022-04-30 NOTE — Assessment & Plan Note (Signed)
Nystatin powder ordered

## 2022-04-30 NOTE — Evaluation (Signed)
Physical Therapy Evaluation Patient Details Name: [name] MRN: [id number] DOB: [birth year] Today's Date: 04/30/2022  History of Present Illness  [name] is a 71 year old female with history of hyperlipidemia, insulin-dependent diabetes mellitus, GERD, morbid obesity, mostly bedbound, cannot sit on edge of bed, presented to the ED on 04/29/2022 for evaluation of altered mental status, dysuria and concerns about her leg wounds and redness.  She also reported a productive cough for several months which she attributes to reflux.       [place] lives in [place] since a rehab stay at [facility] this past summer.  She reports strength and mobility have recently declined again, and she's had several falls.  Now with UTI.  Clinical Impression  Pt is a pleasant 71 year old female who was admitted for acute metabolic encephalopathy. Pt performs bed mobility with max assist and only able to tolerate sitting for a few seconds prior to increased pain and requested to return back supine. Pt reports earlier this year was able to perform SPT between surfaces. Pt reports recent functional mobility she was able to perform bed mobility independently and sit at EOB for meals. Hasn't performed transfers in several weeks/months. Pt demonstrates deficits with strength/mobility/pain. All movement of B LEs increases pain and B edema, erythema, and weeping noted. Increased time to assist with positioning for comfort and educating on movement in bed to prevent deconditioning. She is currently not safe to return back to the [facility] and she is currently not at baseline level. Would benefit from skilled PT to address above deficits and promote optimal return to PLOF; recommend transition to STR upon discharge from acute hospitalization.        Recommendations for follow up therapy are one component of a multi-disciplinary discharge planning process, led by the attending physician.  Recommendations may be updated  based on patient status, additional functional criteria and insurance authorization.  Follow Up Recommendations Skilled nursing-short term rehab (<3 hours/day) Can patient physically be transported by private vehicle: No    Assistance Recommended at Discharge Frequent or constant Supervision/Assistance  Patient can return home with the following  Two people to help with walking and/or transfers;Two people to help with bathing/dressing/bathroom    Equipment Recommendations None recommended by PT  Recommendations for Other Services       Functional Status Assessment Patient has had a recent decline in their functional status and/or demonstrates limited ability to make significant improvements in function in a reasonable and predictable amount of time     Precautions / Restrictions Precautions Precautions: Fall Restrictions Weight Bearing Restrictions: No      Mobility  Bed Mobility Overal bed mobility: Needs Assistance Bed Mobility: Supine to Sit, Sit to Supine     Supine to sit: Max assist Sit to supine: Max assist   General bed mobility comments: heavy assist required and use of bed controls to perform bed mobility. Once seated at EOB, pt in severe pain, only able to tolerate sitting x 10 seconds before asking to return back supine. Further mobility efforts declined. Used bed controls for repositioning    Transfers                   General transfer comment: unable    Ambulation/Gait                  Stairs            Wheelchair Mobility    Modified Rankin (Stroke Patients  Only)       Balance Overall balance assessment: Needs assistance, History of Falls Sitting-balance support: Feet unsupported, Bilateral upper extremity supported Sitting balance-Leahy Scale: Poor                                       Pertinent Vitals/Pain Pain Assessment Pain Assessment: Faces Faces Pain Scale: Hurts worst Pain Location: B LE  distal to knee Pain Descriptors / Indicators: Discomfort, Grimacing, Guarding, Heaviness Pain Intervention(s): Limited activity within patient's tolerance, Repositioned    Home Living Family/patient expects to be discharged to:: Assisted living                 Home Equipment: Rollator (4 wheels);Adaptive equipment;Wheelchair - manual;Hospital bed Additional Comments: trapeze bar for bed mobility    Prior Function Prior Level of Function : Needs assist             Mobility Comments: reports she hasn't ambulated in a long time and typically is able to perform bed mobility indep and sit at R side of bed. Reports 4-5 falls in last 6 months ADLs Comments: requires assist, no PCA available. Staff bring meals to her and she eats at EOB     Hand Dominance        Extremity/Trunk Assessment   Upper Extremity Assessment Upper Extremity Assessment: Generalized weakness (B UE grossly 3/5- limited shoulder ROM)    Lower Extremity Assessment Lower Extremity Assessment: Generalized weakness (R LE grossly 2/5 and painful; L LE grossly 3/5)       Communication   Communication: No difficulties  Cognition Arousal/Alertness: Awake/alert Behavior During Therapy: WFL for tasks assessed/performed Overall Cognitive Status: Within Functional Limits for tasks assessed                                          General Comments      Exercises     Assessment/Plan    PT Assessment Patient needs continued PT services  PT Problem List Decreased strength;Decreased activity tolerance;Decreased balance;Decreased mobility;Obesity;Pain       PT Treatment Interventions Gait training;DME instruction;Therapeutic exercise;Therapeutic activities;Balance training;Wheelchair mobility training    PT Goals (Current goals can be found in the Care Plan section)  Acute Rehab PT Goals Patient Stated Goal: to be able to transfer bed->chair PT Goal Formulation: With patient Time For  Goal Achievement: 05/14/22 Potential to Achieve Goals: Good    Frequency Min 2X/week     Co-evaluation               AM-PAC PT "6 Clicks" Mobility  Outcome Measure Help needed turning from your back to your side while in a flat bed without using bedrails?: A Lot Help needed moving from lying on your back to sitting on the side of a flat bed without using bedrails?: A Lot Help needed moving to and from a bed to a chair (including a wheelchair)?: Total Help needed standing up from a chair using your arms (e.g., wheelchair or bedside chair)?: Total Help needed to walk in hospital room?: Total Help needed climbing 3-5 steps with a railing? : Total 6 Click Score: 8    End of Session   Activity Tolerance: Patient limited by pain Patient left: in bed;with bed alarm set Nurse Communication: Mobility status PT Visit Diagnosis: Muscle weakness (generalized) (  M62.81);History of falling (Z91.81);Difficulty in walking, not elsewhere classified (R26.2);Pain Pain - Right/Left: Right Pain - part of body: Leg    Time: 1400-1432 PT Time Calculation (min) (ACUTE ONLY): 32 min   Charges:   PT Evaluation $PT Eval Moderate Complexity: 1 Mod PT Treatments $Therapeutic Activity: 8-22 mins        Greggory Stallion, PT, DPT, [facility] [phone]   Kesa Birky 04/30/2022, 3:50 PM

## 2022-04-30 NOTE — Assessment & Plan Note (Signed)
CPAP ordered

## 2022-04-30 NOTE — Assessment & Plan Note (Signed)
Continue PPI ?

## 2022-04-30 NOTE — Consult Note (Signed)
ANTICOAGULATION CONSULT NOTE - Consult  Pharmacy Consult for heparin gtt Indication: chest pain/ACS  Allergies  Allergen Reactions   Tetanus Toxoid, Adsorbed Swelling   Tetanus Toxoids Swelling   Lisinopril Rash    Other reaction(s): Unknown   Niacin Rash    Other reaction(s): Unknown   Sitagliptin Rash    Other reaction(s): Unknown   Sulfamethoxazole-Trimethoprim Rash    Other reaction(s): Unknown    Patient Measurements: Height: 5\' 5"  (165.1 cm) Weight: (!) 141 kg (310 lb 13.6 oz) IBW/kg (Calculated) : 57 Heparin Dosing Weight: 92.2 kg  Vital Signs: Temp: 98 F (36.7 C) (11/04 2122) Temp Source: Oral (11/04 2122) BP: 140/59 (11/04 2122) Pulse Rate: 65 (11/04 2122)  Labs: Recent Labs    04/28/22 1514 04/28/22 1813 04/29/22 1134 04/29/22 1320 04/29/22 1548 04/29/22 2325  HGB 11.7*  --  11.6*  --   --   --   HCT 39.3  --  39.1  --   --   --   PLT 228  --  226  --   --   --   APTT  --   --   --   --  >200* 60*  LABPROT  --   --   --   --  20.3*  --   INR  --   --   --   --  1.8*  --   HEPARINUNFRC  --   --   --   --   --  >1.10*  CREATININE 1.66*  --  1.75*  --   --   --   TROPONINIHS 61* 63* 72* 105*  --   --     Estimated Creatinine Clearance: 42.2 mL/min (A) (by C-G formula based on SCr of 1.75 mg/dL (H)). Heparin Dosing Weight: 92.2 kg  Medications:  PTA: Eliquis 5mg  BID (last dose unable to confirm with pt; admitted ~1130 11/04) Inpatient:  +heparin gtt  Assessment: 71yo F w/ h/o hypertension, diabetes, CAD, CHF, atrial fibrillation on Eliquis, and CKD who presents to the ED following fall. Pharmacy consulted for transition of home Eliquis to heparin gtt.  Date Time aPTT/HL Rate/Comment 11/04 2325 60 / >1.1 aPTT subtherapeutic      Baseline Labs: aPTT - ordered; INR - ordered Hgb - 11.6; Plts - 226  Goal of Therapy:  Heparin level 0.3-0.7 units/ml aPTT 66-102 seconds Monitor platelets by anticoagulation protocol: Yes   Plan:  Last dose of  eliquis 5mg  (for Afib) at unk time PTA (admitted on 11/03 1130) Give 1400 units bolus x1; then  Increase heparin infusion to 1500 units/hr. Recheck aPTT in 8 hrs after rate change  Titrate by aPTT's until lab correlation is noted, then titrate by anti-xa alone. Continue to monitor H&H and platelets daily while on heparin gtt.  Renda Rolls, PharmD, [facility] 04/30/2022 12:14 AM

## 2022-04-30 NOTE — Assessment & Plan Note (Signed)
Appears stable and overall euvolemic - no pedal or distal lower extremity edema. Echo from 08/27/21 -- EF 40-45%, indeterminate diastolic parameters, mild MR --Monitor volume status closely --Continue home Lasix 20 mg PO daily --Monitor renal function and electrolytes

## 2022-05-01 ENCOUNTER — Other Ambulatory Visit (HOSPITAL_COMMUNITY): Payer: Self-pay

## 2022-05-01 DIAGNOSIS — A0472 Enterocolitis due to Clostridium difficile, not specified as recurrent: Secondary | ICD-10-CM | POA: Diagnosis not present

## 2022-05-01 DIAGNOSIS — I872 Venous insufficiency (chronic) (peripheral): Secondary | ICD-10-CM | POA: Diagnosis not present

## 2022-05-01 DIAGNOSIS — N3000 Acute cystitis without hematuria: Secondary | ICD-10-CM | POA: Diagnosis not present

## 2022-05-01 DIAGNOSIS — R531 Weakness: Secondary | ICD-10-CM | POA: Diagnosis not present

## 2022-05-01 LAB — CBC
HCT: 38 % (ref 36.0–46.0)
Hemoglobin: 11.3 g/dL — ABNORMAL LOW (ref 12.0–15.0)
MCH: 25.3 pg — ABNORMAL LOW (ref 26.0–34.0)
MCHC: 29.7 g/dL — ABNORMAL LOW (ref 30.0–36.0)
MCV: 85 fL (ref 80.0–100.0)
Platelets: 267 10*3/uL (ref 150–400)
RBC: 4.47 MIL/uL (ref 3.87–5.11)
RDW: 16.3 % — ABNORMAL HIGH (ref 11.5–15.5)
WBC: 7.3 10*3/uL (ref 4.0–10.5)
nRBC: 0 % (ref 0.0–0.2)

## 2022-05-01 LAB — BASIC METABOLIC PANEL
Anion gap: 6 (ref 5–15)
BUN: 43 mg/dL — ABNORMAL HIGH (ref 8–23)
CO2: 28 mmol/L (ref 22–32)
Calcium: 8.4 mg/dL — ABNORMAL LOW (ref 8.9–10.3)
Chloride: 107 mmol/L (ref 98–111)
Creatinine, Ser: 1.87 mg/dL — ABNORMAL HIGH (ref 0.44–1.00)
GFR, Estimated: 28 mL/min — ABNORMAL LOW (ref >60)
Glucose, Bld: 110 mg/dL — ABNORMAL HIGH (ref 70–99)
Potassium: 3.9 mmol/L (ref 3.5–5.1)
Sodium: 141 mmol/L (ref 135–145)

## 2022-05-01 LAB — GLUCOSE, CAPILLARY
Glucose-Capillary: 83 mg/dL (ref 70–99)
Glucose-Capillary: 133 mg/dL — ABNORMAL HIGH (ref 70–99)
Glucose-Capillary: 161 mg/dL — ABNORMAL HIGH (ref 70–99)
Glucose-Capillary: 125 mg/dL — ABNORMAL HIGH (ref 70–99)

## 2022-05-01 LAB — LACTIC ACID, PLASMA: Lactic Acid, Venous: 1.2 mmol/L (ref 0.5–1.9)

## 2022-05-01 MED ORDER — VANCOMYCIN HCL 125 MG PO CAPS
125.0000 mg | ORAL_CAPSULE | Freq: Four times a day (QID) | ORAL | Status: DC
  Administered 2022-05-01 – 2022-05-07 (×24): 125 mg via ORAL
  Filled 2022-05-01 (×25): qty 1

## 2022-05-01 MED ORDER — VANCOMYCIN HCL 125 MG PO CAPS: 125.0000 mg | ORAL_CAPSULE | Freq: Four times a day (QID) | ORAL | Status: DC

## 2022-05-01 NOTE — Evaluation (Signed)
Occupational Therapy Evaluation Patient Details Name: [name] MRN: [id number] DOB: 1950/10/24 Today's Date: 05/01/2022   History of Present Illness Ms. Berklee Battey is a 71 year old female with history of hyperlipidemia, insulin-dependent diabetes mellitus, GERD, morbid obesity, mostly bedbound, presented to the ED on 04/29/2022 for evaluation of altered mental status, dysuria and concerns about her leg wounds and redness.  She also reported a productive cough for several months which she attributes to reflux.       [place] lives in [place] since a rehab stay at [facility] this past summer.  She reports strength and mobility have recently declined again, and she's had several falls.  Now with UTI.   Clinical Impression   Ms. Chicoine presents to OT with generalized weakness and low endurance that impacts her ability to safely and independently complete ADLs.  Prior to admission, patient was able to perform feeding and grooming while seated EOB.  She was able to perform bed mobility with mod I, but has not been standing recently.  Currently, patient requires max assist for bed mobility, as well as max assist for all LB ADLs including dressing, bathing, and toileting.  OT provided education re: bed mobility techniques this date.  Ms. Falor will likely continue to benefit from skilled OT services in acute setting to support functional strengthening, safety and independence in ADLs.  Recommend short term rehab at discharge to further support these goals.     Recommendations for follow up therapy are one component of a multi-disciplinary discharge planning process, led by the attending physician.  Recommendations may be updated based on patient status, additional functional criteria and insurance authorization.   Follow Up Recommendations  Skilled nursing-short term rehab (<3 hours/day)    Assistance Recommended at Discharge Frequent or constant Supervision/Assistance  Patient can return home with  the following Two people to help with walking and/or transfers;A lot of help with bathing/dressing/bathroom;Assistance with cooking/housework;Assist for transportation;Help with stairs or ramp for entrance    Functional Status Assessment  Patient has had a recent decline in their functional status and demonstrates the ability to make significant improvements in function in a reasonable and predictable amount of time.  Equipment Recommendations  None recommended by OT    Recommendations for Other Services       Precautions / Restrictions Precautions Precautions: Fall Restrictions Weight Bearing Restrictions: No      Mobility Bed Mobility Overal bed mobility: Needs Assistance Bed Mobility: Rolling, Supine to Sit Rolling: Mod assist   Supine to sit: Max assist     General bed mobility comments: heavy assist required and use of bed controls to perform bed mobility. Unable to fully achieve sitting position 2/2 concern for patient safety Patient Response: Cooperative  Transfers Overall transfer level: Needs assistance                 General transfer comment: not appropriate at this time      Balance Overall balance assessment: Needs assistance, History of Falls Sitting-balance support: Feet unsupported, Bilateral upper extremity supported Sitting balance-Leahy Scale: Poor                                     ADL either performed or assessed with clinical judgement   ADL Overall ADL's : Needs assistance/impaired Eating/Feeding: Set up;Sitting   Grooming: Set up;Sitting   Upper Body Bathing: Set up;Sitting   Lower Body Bathing: Maximal assistance;Sitting/lateral leans  Upper Body Dressing : Set up;Sitting   Lower Body Dressing: Maximal assistance;Bed level   Toilet Transfer: Total assistance             General ADL Comments: Able to perform UB ADLs while sitting EOB, requires max-total assist for LB ADLs     Vision Baseline  Vision/History: 1 Wears glasses Patient Visual Report: No change from baseline       Perception     Praxis      Pertinent Vitals/Pain Pain Assessment Pain Assessment: Faces Faces Pain Scale: Hurts even more Breathing: occasional labored breathing, short period of hyperventilation Negative Vocalization: occasional moan/groan, low speech, negative/disapproving quality Facial Expression: smiling or inexpressive Body Language: relaxed Consolability: distracted or reassured by voice/touch PAINAD Score: 3 Pain Location: B LE distal to knee Pain Descriptors / Indicators: Discomfort, Grimacing, Guarding, Heaviness Pain Intervention(s): Limited activity within patient's tolerance, Monitored during session, Repositioned     Hand Dominance Right   Extremity/Trunk Assessment Upper Extremity Assessment Upper Extremity Assessment: Generalized weakness   Lower Extremity Assessment Lower Extremity Assessment: Generalized weakness       Communication Communication Communication: No difficulties   Cognition Arousal/Alertness: Awake/alert Behavior During Therapy: WFL for tasks assessed/performed Overall Cognitive Status: Within Functional Limits for tasks assessed                                       General Comments       Exercises Other Exercises Other Exercises: provided education re: OT role and plan of care, fall and safety precautions, self care, bed mobility   Shoulder Instructions      Home Living Family/patient expects to be discharged to:: Assisted living                             Home Equipment: Rollator (4 wheels);Adaptive equipment;Hospital bed;Rolling Walker (2 wheels);BSC/3in1;Shower seat;Wheelchair - Higher education careers adviser: [employer] Additional Comments: trapeze bar for bed mobility, bidet      Prior Functioning/Environment Prior Level of Function : Needs assist       Physical Assist : ADLs (physical);Mobility  (physical) Mobility (physical): Transfers;Gait ADLs (physical): Bathing;Dressing;Toileting;IADLs Mobility Comments: Reports recently she has been able to perform bed mobility independently and sit at R side of bed, reports 4-5 falls in last 6 months primarily due to sliding off bed.  Patient has not walked in recent months. ADLs Comments: Requires assistance for bathing, dressing, toileting.  She is able to sit EOB with supervision assist.        OT Problem List: Decreased strength;Decreased range of motion;Decreased activity tolerance;Impaired balance (sitting and/or standing)      OT Treatment/Interventions: Self-care/ADL training;Therapeutic exercise;DME and/or AE instruction;Therapeutic activities;Patient/family education;Balance training    OT Goals(Current goals can be found in the care plan section) Acute Rehab OT Goals Patient Stated Goal: to regain strength OT Goal Formulation: With patient Time For Goal Achievement: 05/15/22 Potential to Achieve Goals: Good  OT Frequency: Min 2X/week    Co-evaluation              AM-PAC OT "6 Clicks" Daily Activity     Outcome Measure Help from another person eating meals?: A Little Help from another person taking care of personal grooming?: A Little Help from another person toileting, which includes using toliet, bedpan, or urinal?: A Lot Help from another person bathing (including washing, rinsing, drying)?:  A Lot Help from another person to put on and taking off regular upper body clothing?: A Little Help from another person to put on and taking off regular lower body clothing?: A Lot 6 Click Score: 15   End of Session    Activity Tolerance: Patient tolerated treatment well Patient left: in bed;with call bell/phone within reach  OT Visit Diagnosis: Muscle weakness (generalized) (M62.81);Repeated falls (R29.6)                Time: 2411-4643 OT Time Calculation (min): 34 min Charges:  OT General Charges $OT Visit: 1  Visit OT Evaluation $OT Eval Moderate Complexity: 1 Mod OT Treatments $Therapeutic Activity: 8-22 mins  Jeneen Montgomery, OTR/L 05/01/22, 10:39 AM

## 2022-05-01 NOTE — Progress Notes (Addendum)
Progress Note   Patient: [name] MEQ:[id number] DOB: 23-Feb-1951 DOA: 04/29/2022     2 DOS: the patient was seen and examined on 05/01/2022   Brief hospital course: Ms. Ortha Metts is a 71 year old female with history of hyperlipidemia, insulin-dependent diabetes mellitus, GERD, morbid obesity, mostly bedbound, cannot sit on edge of bed, presented to the ED on 04/29/2022 for evaluation of altered mental status, dysuria and concerns about her leg wounds and redness.  She also reported a productive cough for several months which she attributes to reflux.    Pt lives in [place] since a rehab stay at [facility] this past summer.  She reports strength and mobility have recently declined again, and she's had several falls.  PT/OT evaluations ordered.   Evaluation revealed likely UTI, and there is concern for cellulitis related to her b/l leg wounds.  Started on empiric Rocephin pending urine culture results.      Assessment and Plan: * Acute metabolic encephalopathy Presented with altered mental status, likely infection etiology from UTI.  No focal neurologic deficits. 11/5: mental status improved and appears baseline - Treat infection as outlined -- Delirium & fall precautions -- PT/OT -- Monitor for metabolic derangements  Generalized weakness Due to chronic deconditioning, morbid obesity, and acute illness exacerbating baseline weakness.  Pt resides at ALF. --PT/OT following --SNF/rehab recommended --TOC following --Fall precautions  C. difficile diarrhea Pt noted to have fecal incontinence with loose / water stools.  C diff tested, showed positive antigen, but negative toxin.  Given symptomatic, will proceed with treatment. 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 - Continue Rocephin given UTI -- follow cultures Chronic bilateral lower extremity wounds --Continue wound care and close monitoring for signs of infection    Elevated troponin Initial troponin is 72 >> 105 >> 56.  Due to demand ischemia in setting of acute illness. No chest pain or acute ischemic EKG changes. Started on empiric heparin on admission. --Stop heparin --Resume Eliquis  Chronic systolic CHF (congestive heart failure) (HCC) Appears stable and overall euvolemic - no pedal or distal lower extremity edema. Echo from 08/27/21 -- EF 40-45%, indeterminate diastolic parameters, mild MR --Monitor volume status closely --Continue home Lasix 20 mg PO daily --Monitor renal function and electrolytes   Paroxysmal atrial fibrillation (HCC) HR controlled. Stop heparin drip. Resume Eliquis. Appears not to be on rate control agent.  Obstructive sleep apnea CPAP ordered  Morbid obesity ([place]) Body mass index is 51.73 kg/m. Complicates overall care and prognosis.  Recommend lifestyle modifications including physical activity and diet for weight loss and overall long-term health.    CAD (coronary artery disease) - Atorvastatin 40 mg daily resumed  Intertriginous candidiasis Nystatin powder ordered  GERD (gastroesophageal reflux disease) Continue PPI  Stage 3b chronic kidney disease (CKD) (HCC) - At baseline  OSA on CPAP - CPAP nightly ordered   Sepsis ruled out - only SIRS criteria met was tachypnea.  Does not meet for sepsis despite UTI and C diff infections.       Subjective: Patient was awake sitting up in bed when seen on rounds this morning.  She reports actively having bowel movement which she is unable to control.  She states does not  know if she is having any diarrhea.  Bedside nursing staff does report loose watery stools.  Patient denies fevers but reports  always being cold.  No nausea vomiting or abdominal pain.  No other acute events reported.  Physical Exam: Vitals:   04/30/22 2031 05/01/22 0426 05/01/22 0815 05/01/22 1156  BP: (!) 146/50 (!) 157/72  (!) 145/126  Pulse: 61 61 64 64  Resp: _0 (!) 25  Temp: 98.1 F (36.7 C) (!) 97.4 F (36.3 C) 97.9 F (36.6 C) 97.8 F (36.6 C)  TempSrc:  Oral Oral Oral  SpO2: 95% 94% 93% 93%  Weight:      Height:       General exam: awake, alert, no acute distress morbidly obese HEENT: Wearing glasses, bilateral nystagmus, moist mucus membranes, hearing grossly normal  Respiratory system: CTAB minutes due to body habitus, no wheezes, rales or rhonchi, normal respiratory effort. Cardiovascular system: normal S1/S2, RRR, unable to visualize JVD, no pedal edema.   Gastrointestinal system: soft, nontender nondistended abdomen, hyperactive bowel sounds Central nervous system: A&O x. no gross focal neurologic deficits, normal speech Extremities: (See images from H&P).  Bilateral distal lower extremities are erythematous but no differential warmth on palpation, bilateral distal lower extremities with open ulcers that are not draining any purulent fluid Skin: dry, intact, normal temperature Psychiatry: normal mood, congruent affect, judgement and insight appear normal    Data Reviewed:  Notable labs: Creatinine 1.87 from 1.78, glucose 110, BUN 43, hemoglobin stable 11.3.  CBGs overall at goal  Family Communication: None.  Disposition: Status is: Inpatient Remains inpatient appropriate because: Remains on IV antibiotics pending cultures and further clinical PruMyx.  Requires SNF/rehab placement.   Planned Discharge Destination:  SNF/rehab    Time spent: 40 minutes  Author: Ezekiel Slocumb, DO 05/01/2022 12:37 PM  For on call review [email or web address].

## 2022-05-01 NOTE — Assessment & Plan Note (Addendum)
Pt noted to have fecal incontinence with loose / water stools.  C diff tested, showed positive antigen, but negative toxin.  Given symptomatic, will proceed with treatment. --Started PO vancomycin 125 mg PO QID --Patient was not improving as expected on vancomycin --Changed to Dificid on 11/12 --Continue Dificid to complete 10 day course --PCP follow up at end of course --Repeat labs at follow up

## 2022-05-01 NOTE — Assessment & Plan Note (Signed)
Due to chronic deconditioning, morbid obesity, and acute illness exacerbating baseline weakness.  Pt resides at ALF. --PT/OT following --SNF/rehab recommended --TOC following --Fall precautions

## 2022-05-01 NOTE — Consult Note (Signed)
[place] Nurse Consult Note: Patient receiving care in [facility] 111. Reason for Consult: BLE wounds Wound type: patient with chronic venous rerlux per record. No ABI on file that I could find. I question if she has lyphedema. Pressure Injury POA: Yes/No/NA Measurement: na Wound bed: RLE with one irregularly shaped, superficial wound on the lateral and medial aspects of the calf. Yellow drainage on the underpad. Patient with a wound on the LLE, scant drainage from this one Drainage (amount, consistency, odor) yellow Periwound: erythematous on RLE with extensive crusting of skin. Slight pink color to LLE. Dressing procedure/placement/frequency: GENTLY wash BLE with soap and water. Pat dry. Apply Sween Moisturizing Ointment. Place Xeroform gauzes Kellie Simmering [phone]) over each weeping area.  Beginning behind the toes and going to just below the knees, spiral wrap kerlix, then 4 or 6 inch ace wrap. Perform daily.   Lymphedema  Resources (updated 04/2021) Each site requires a referral from your primary care MD [facility] [address], [place]  [phone] (Upper extremities)  [place], [place] [phone] (Lower extremities, PATIENT CAN NOT HAVE A WOUND)  [place] [place], [place] [address] [phone]  [place], [address] Medical Office Building [place], [place] [phone]  [facility] [address], [place] [address] [phone]  [place] at [facility]  (only treatment for lymphedema related to cancer diagnosis) [place], [place] [address] [phone]    [facility] [place], [place] [address] [phone] [facility] [place] [phone] [address].  [place],  [address] [phone]   Monitor the wound area(s) for worsening of condition such as: Signs/symptoms of infection,  Increase in size,  Development of or worsening of odor, Development of pain, or increased pain at the affected locations.  Notify the medical team if any of these develop.  Thank you for the consult.  Discussed plan of care with the patient.  [place] nurse will not follow at this time.  Please re-consult the [place] team if needed.  Val Riles, RN, MSN, CWOCN, CNS-BC, pager [phone]

## 2022-05-02 DIAGNOSIS — E162 Hypoglycemia, unspecified: Secondary | ICD-10-CM | POA: Diagnosis not present

## 2022-05-02 DIAGNOSIS — E119 Type 2 diabetes mellitus without complications: Secondary | ICD-10-CM | POA: Diagnosis not present

## 2022-05-02 DIAGNOSIS — A0472 Enterocolitis due to Clostridium difficile, not specified as recurrent: Secondary | ICD-10-CM | POA: Diagnosis not present

## 2022-05-02 DIAGNOSIS — G9341 Metabolic encephalopathy: Secondary | ICD-10-CM | POA: Diagnosis not present

## 2022-05-02 HISTORY — DX: Hypoglycemia, unspecified: E16.2

## 2022-05-02 LAB — BASIC METABOLIC PANEL
Anion gap: 7 (ref 5–15)
BUN: 41 mg/dL — ABNORMAL HIGH (ref 8–23)
CO2: 28 mmol/L (ref 22–32)
Calcium: 8.5 mg/dL — ABNORMAL LOW (ref 8.9–10.3)
Chloride: 107 mmol/L (ref 98–111)
Creatinine, Ser: 1.68 mg/dL — ABNORMAL HIGH (ref 0.44–1.00)
GFR, Estimated: 32 mL/min — ABNORMAL LOW (ref >60)
Glucose, Bld: 65 mg/dL — ABNORMAL LOW (ref 70–99)
Potassium: 3.9 mmol/L (ref 3.5–5.1)
Sodium: 142 mmol/L (ref 135–145)

## 2022-05-02 LAB — CBC
HCT: 36.8 % (ref 36.0–46.0)
Hemoglobin: 11.1 g/dL — ABNORMAL LOW (ref 12.0–15.0)
MCH: 25.3 pg — ABNORMAL LOW (ref 26.0–34.0)
MCHC: 30.2 g/dL (ref 30.0–36.0)
MCV: 84 fL (ref 80.0–100.0)
Platelets: 199 10*3/uL (ref 150–400)
RBC: 4.38 MIL/uL (ref 3.87–5.11)
RDW: 16.2 % — ABNORMAL HIGH (ref 11.5–15.5)
WBC: 7.7 10*3/uL (ref 4.0–10.5)
nRBC: 0 % (ref 0.0–0.2)

## 2022-05-02 LAB — GLUCOSE, CAPILLARY
Glucose-Capillary: 54 mg/dL — ABNORMAL LOW (ref 70–99)
Glucose-Capillary: 55 mg/dL — ABNORMAL LOW (ref 70–99)
Glucose-Capillary: 121 mg/dL — ABNORMAL HIGH (ref 70–99)
Glucose-Capillary: 140 mg/dL — ABNORMAL HIGH (ref 70–99)
Glucose-Capillary: 168 mg/dL — ABNORMAL HIGH (ref 70–99)
Glucose-Capillary: 158 mg/dL — ABNORMAL HIGH (ref 70–99)

## 2022-05-02 LAB — MAGNESIUM: Magnesium: 2.1 mg/dL (ref 1.7–2.4)

## 2022-05-02 MED ORDER — INSULIN DETEMIR 100 UNIT/ML ~~LOC~~ SOLN
60.0000 [IU] | Freq: Every day | SUBCUTANEOUS | Status: DC
  Administered 2022-05-03 – 2022-05-04 (×2): 60 [IU] via SUBCUTANEOUS
  Filled 2022-05-02 (×3): qty 0.6

## 2022-05-02 MED ORDER — INSULIN DETEMIR 100 UNIT/ML ~~LOC~~ SOLN
30.0000 [IU] | Freq: Once | SUBCUTANEOUS | Status: AC
  Administered 2022-05-02: 30 [IU] via SUBCUTANEOUS
  Filled 2022-05-02: qty 0.3

## 2022-05-02 MED ORDER — INSULIN DETEMIR 100 UNIT/ML ~~LOC~~ SOLN
60.0000 [IU] | Freq: Every day | SUBCUTANEOUS | Status: DC
  Filled 2022-05-02: qty 0.6

## 2022-05-02 MED ORDER — INSULIN DETEMIR 100 UNIT/ML ~~LOC~~ SOLN: 30.0000 [IU] | Freq: Once | SUBCUTANEOUS | Status: DC

## 2022-05-02 NOTE — Assessment & Plan Note (Addendum)
Pt had multiple hypoglycemic episodes earlier during admission --On Levemir to 35 units daily  (Home dose was 65 units) --Monitor sugars closely --Follow up PCP in 1-2 weeks

## 2022-05-02 NOTE — Inpatient Diabetes Management (Signed)
Inpatient Diabetes Program Recommendations  AACE/ADA: New Consensus Statement on Inpatient Glycemic Control (2015)  Target Ranges:  Prepandial:   less than 140 mg/dL      [facility] postprandial:   less than 180 mg/dL (1-2 hours)      Critically ill patients:  140 - 180 mg/dL   Lab Results  Component Value Date   GLUCAP 121 (H) 05/02/2022   HGBA1C 6.8 (H) 04/30/2022    Latest Reference Range & Units 05/01/22 16:39 05/01/22 21:01 05/02/22 07:47 05/02/22 07:49 05/02/22 08:31  Glucose-Capillary 70 - 99 mg/dL 161 (H) 125 (H) 55 (L) 54 (L) 121 (H)  (H): Data is abnormally high (L): Data is abnormally low  Diabetes history: DM2 Outpatient Diabetes medications: Levemir 65 units q am Current orders for Inpatient glycemic control: Levemir 30 units today, then Levemir 60 units qd starting 11/8, Novolog 0-9 units tid, 0-5 units hs  Inpatient Diabetes Program Recommendations:   Patient had hypoglycemia this am of 54. Levemir dose for this am 30 units. Agree with today's dose and Please consider: -Decrease Levemir to 30 units qd  Thank you, Nani Gasser. Oviya Ammar, RN, MSN, CDE  Diabetes Coordinator Inpatient Glycemic Control Team Team Pager [phone] (8am-5pm) 05/02/2022 9:40 AM

## 2022-05-02 NOTE — Assessment & Plan Note (Signed)
11/7 AM glucose was 54.  Improved after given juice. Lowered dose of Levemir as outlined. Monitor closely.  Hypoglycemia protocol.

## 2022-05-02 NOTE — Progress Notes (Signed)
Progress Note   Patient: [name] PQZ:[id number] DOB: [birth year] DOA: 04/29/2022     3 DOS: the patient was seen and examined on 05/02/2022   Brief hospital course: [name] is a 71 year old female with history of hyperlipidemia, insulin-dependent diabetes mellitus, GERD, morbid obesity, mostly bedbound, cannot sit on edge of bed, presented to the ED on 04/29/2022 for evaluation of altered mental status, dysuria and concerns about her leg wounds and redness.  She also reported a productive cough for several months which she attributes to reflux.    Pt lives in [place] ([place] since a rehab stay [facility] this past summer.  She reports strength and mobility have recently declined again, and she's had several falls.  PT/OT evaluations ordered.   Evaluation revealed likely UTI, and there is concern for cellulitis related to her b/l leg wounds.  Started on empiric Rocephin pending urine culture results.      Assessment and Plan: * Acute metabolic encephalopathy Presented with altered mental status, likely infection etiology from UTI.  No focal neurologic deficits. 11/5: mental status improved and appears baseline - Treat infection as outlined -- Delirium & fall precautions -- PT/OT -- Monitor for metabolic derangements  Generalized weakness Due to chronic deconditioning, morbid obesity, and acute illness exacerbating baseline weakness.  Pt resides at ALF. --PT/OT following --SNF/rehab recommended --TOC following --Fall precautions  C. difficile diarrhea Pt noted to have fecal incontinence with loose / water stools.  C diff tested, showed positive antigen, but negative toxin.  Given symptomatic, will proceed with treatment. 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 - Treated with 3 days Rocephin (given UTI) --Monitor wounds for signs of infection --WOC consulted, see wound care orders Chronic bilateral lower extremity wounds due to apparent lymphedema --Continue wound care and close monitoring for signs of infection    Elevated troponin Initial troponin is 72 >> 105 >> 56.  Due to demand ischemia in setting of acute illness. No chest pain or acute ischemic EKG changes. Started on empiric heparin on admission. --Stop heparin --Resume Eliquis  Chronic systolic CHF (congestive heart failure) (HCC) Appears stable and overall euvolemic - no pedal or distal lower extremity edema. Echo from 08/27/21 -- EF 40-45%, indeterminate diastolic parameters, mild MR --Monitor volume status closely --Continue home Lasix 20 mg PO daily --Monitor renal function and electrolytes   Paroxysmal atrial fibrillation (HCC) HR controlled. Stop heparin drip. Resume Eliquis. Appears not to be on rate control agent.  Diabetes mellitus without complication (HCC) Hypoglycemic episode this AM. --Reduce Levemir to 30 units daily for now (Typical dose is 65 units daily) --Sliding scale Novolog --Adjust for goal 140-180 while inpatient  Obstructive sleep apnea CPAP ordered  Morbid obesity ([place]) Body mass index is 51.73 kg/m. Complicates overall care and prognosis.  Recommend lifestyle modifications including physical activity and diet for weight loss and overall long-term health.    CAD (coronary artery disease) - Atorvastatin 40 mg daily resumed  Hypoglycemia 11/7 AM glucose was 54.  Improved after given juice. Lowered dose of Levemir as outlined. Monitor closely.  Hypoglycemia protocol.   Intertriginous candidiasis Nystatin powder ordered  GERD (gastroesophageal reflux disease) Continue PPI  Stage 3b  chronic kidney disease (CKD) (HCC) - At baseline  OSA on CPAP - CPAP nightly ordered   Sepsis ruled out - only SIRS criteria met was tachypnea.   Does not meet for sepsis despite UTI and C diff infections.         Subjective: Patient was awake sitting up in bed visiting with a friend when seen on rounds today.  She reports ongoing loose stools and inability to control BM's.  No abdominal pain or N/V.  She had low blood sugar this AM and reports waking up feeling confused, as if she'd been moved to another room.  No other acute complaints or acute events reported.  Physical Exam: Vitals:   05/01/22 2200 05/02/22 0023 05/02/22 0529 05/02/22 0744  BP:  127/65 123/61 130/64  Pulse:  (!) 59 61 62  Resp: (!) 30 (!) 25 (!) 21 19  Temp:  97.7 F (36.5 C) (!) 97.4 F (36.3 C) 98.8 F (37.1 C)  TempSrc:  Oral    SpO2:  96% 94% 93%  Weight:      Height:       General exam: awake, alert, no acute distress morbidly obese HEENT: Wearing glasses, bilateral nystagmus, moist mucus membranes, hearing grossly normal  Respiratory system: CTAB minutes due to body habitus, no wheezes, rales or rhonchi, normal respiratory effort. Cardiovascular system: normal S1/S2, RRR, unable to visualize JVD, no pedal edema.   Gastrointestinal system: soft, nontender nondistended abdomen, hyperactive bowel sounds Central nervous system: A&O x. no gross focal neurologic deficits, normal speech Extremities: Bilateral distal lower extremities wrapped, no pedal edema, proximal edema of all extremities consistent with lymphedema Skin: dry, intact, normal temperature Psychiatry: normal mood, congruent affect, judgement and insight appear normal    Data Reviewed:  Notable labs: Cr improved 1.87 >> 1.68, Hbg stable 11.1.  Hypoglycemia noted (55, 54 this AM >> 121, 140)  Family Communication: None.  Disposition: Status is: Inpatient Remains inpatient appropriate because: Remains on IV antibiotics pending  cultures and further clinical PruMyx.  Requires SNF/rehab placement.   Planned Discharge Destination:  SNF/rehab    Time spent: 40 minutes  Author: Ezekiel Slocumb, DO 05/02/2022 2:16 PM  For on call review [email or web address].

## 2022-05-02 NOTE — Progress Notes (Signed)
Occupational Therapy Treatment Patient Details Name: [name] MRN: [id number] DOB: 1950/09/05 Today's Date: 05/02/2022   History of present illness [name] is a 71 year old female with history of hyperlipidemia, insulin-dependent diabetes mellitus, GERD, morbid obesity, mostly bedbound, presented to the ED on 04/29/2022 for evaluation of altered mental status, dysuria and concerns about her leg wounds and redness.  She also reported a productive cough for several months which she attributes to reflux.       Pt lives in [place] ([place] since a rehab stay at [facility] this past summer.  She reports strength and mobility have recently declined again, and she's had several falls.  Now with UTI.   OT comments  Upon entering session, pt resting in bed and agreeable to OT. Pt reported having incontinent BM episode in bed. NT present to assist with linen change. Pt required Max A to complete rolling in order for OT to assist with posterior hygiene. Pt deferred further grooming tasks and mobility 2/2 pain after completing bed mobility. Pt repositioned with BLEs elevated to reduce edema. Pt left as received with all needs in reach. Pt is making progress toward goal completion. D/C recommendation remains appropriate. OT will continue to follow acutely.     Recommendations for follow up therapy are one component of a multi-disciplinary discharge planning process, led by the attending physician.  Recommendations may be updated based on patient status, additional functional criteria and insurance authorization.    Follow Up Recommendations  Skilled nursing-short term rehab (<3 hours/day)    Assistance Recommended at Discharge Frequent or constant Supervision/Assistance  Patient can return home with the following  Two people to help with walking and/or transfers;A lot of help with bathing/dressing/bathroom;Assistance with cooking/housework;Assist for transportation;Help with stairs or ramp for  entrance   Equipment Recommendations  None recommended by OT    Recommendations for Other Services      Precautions / Restrictions Precautions Precautions: Fall Restrictions Weight Bearing Restrictions: No       Mobility Bed Mobility Overal bed mobility: Needs Assistance Bed Mobility: Rolling Rolling: Max assist         General bed mobility comments: heavy assist required and use of bed controls to perform bed mobility, +2 to scoot pt up towards [facility]    Transfers                   General transfer comment: not appropriate at this time     Balance       Sitting balance - Comments: pt deferred                                   ADL either performed or assessed with clinical judgement   ADL Overall ADL's : Needs assistance/impaired                     Lower Body Dressing: Maximal assistance;Bed level       Toileting- Clothing Manipulation and Hygiene: Total assistance;Bed level         General ADL Comments: pt with incontinent BM episode    Extremity/Trunk Assessment Upper Extremity Assessment Upper Extremity Assessment: Generalized weakness   Lower Extremity Assessment Lower Extremity Assessment: Generalized weakness        Vision Baseline Vision/History: 1 Wears glasses Patient Visual Report: No change from baseline     Perception     Praxis  Cognition Arousal/Alertness: Awake/alert Behavior During Therapy: WFL for tasks assessed/performed Overall Cognitive Status: Within Functional Limits for tasks assessed                                          Exercises      Shoulder Instructions       General Comments Pt's SpO2 88-94% on RA in supine (could be due to poor pleth), pt reporting SOB and agreeable to put O2 back on at 1L    Pertinent Vitals/ Pain       Pain Assessment Pain Assessment: Faces Faces Pain Scale: Hurts even more Pain Location: BLEs, buttocks/perineal area Pain  Descriptors / Indicators: Discomfort, Grimacing, Guarding, Heaviness, Sore Pain Intervention(s): Limited activity within patient's tolerance, Monitored during session, Repositioned  Home Living                                          Prior Functioning/Environment              Frequency  Min 2X/week        Progress Toward Goals  OT Goals(current goals can now be found in the care plan section)  Progress towards OT goals: Progressing toward goals  Acute Rehab OT Goals Patient Stated Goal: to regain strength OT Goal Formulation: With patient Time For Goal Achievement: 05/15/22 Potential to Achieve Goals: Good  Plan Discharge plan remains appropriate;Frequency remains appropriate    Co-evaluation                 AM-PAC OT "6 Clicks" Daily Activity     Outcome Measure   Help from another person eating meals?: A Little Help from another person taking care of personal grooming?: A Little Help from another person toileting, which includes using toliet, bedpan, or urinal?: A Lot Help from another person bathing (including washing, rinsing, drying)?: A Lot Help from another person to put on and taking off regular upper body clothing?: A Little Help from another person to put on and taking off regular lower body clothing?: A Lot 6 Click Score: 15    End of Session    OT Visit Diagnosis: Muscle weakness (generalized) (M62.81);Repeated falls (R29.6)   Activity Tolerance Patient limited by pain   Patient Left in bed;with call bell/phone within reach;with bed alarm set   Nurse Communication Mobility status        Time: 4818-5631 OT Time Calculation (min): 27 min  Charges: OT General Charges $OT Visit: 1 Visit OT Treatments $Self Care/Home Management : 23-37 mins  [facility] MS, OTR/L ascom [phone]  05/02/22, 1:46 PM

## 2022-05-02 NOTE — TOC Initial Note (Signed)
Transition of Care [facility]) - Initial/Assessment Note    Patient Details  Name: [name] MRN: [id number] Date of Birth: Apr 05, 1951  Transition of [facility] CM/SW Contact:    Colen Darling, [place] Phone Number: 05/02/2022, 4:30 PM  Clinical Narrative:                  TOC met with the patient and she picks up her medications at [facility] [address]. Her PCP is Dr. Rolla Plate and Merrily Brittle. She would like a referral sent to [facility]. She previously received home services from [place] by an [place].  Expected Discharge Plan: [place]     Patient Goals and CMS Choice Patient states their goals for this hospitalization and ongoing recovery are:: to attend rehab CMS Medicare.gov Compare Post Acute Care list provided to:: Patient Choice offered to / list presented to : Patient  Expected Discharge Plan and Services Expected Discharge Plan: [place]                                              Prior Living Arrangements/Services     Patient language and need for interpreter reviewed:: No Do you feel safe going back to the place where you live?: Yes      Need for Family Participation in Patient Care: No (Comment) Care giver support system in place?: Yes (comment)   Criminal Activity/Legal Involvement Pertinent to Current Situation/Hospitalization: No - Comment as needed  Activities of Daily Living Home Assistive Devices/Equipment: Walker (specify type), Wheelchair ADL Screening (condition at time of admission) Patient's cognitive ability adequate to safely complete daily activities?: No Is the patient deaf or have difficulty hearing?: No Does the patient have difficulty seeing, even when wearing glasses/contacts?: No Does the patient have difficulty concentrating, remembering, or making decisions?: No Patient able to express need for assistance with ADLs?: Yes Does the patient have difficulty  dressing or bathing?: Yes Independently performs ADLs?: No Communication: Independent Dressing (OT): Needs assistance Is this a change from baseline?: Pre-admission baseline Grooming: Needs assistance Is this a change from baseline?: Pre-admission baseline Feeding: Independent Bathing: Needs assistance Is this a change from baseline?: Pre-admission baseline Toileting: Dependent Is this a change from baseline?: Pre-admission baseline In/Out Bed: Dependent Is this a change from baseline?: Pre-admission baseline Walks in Home: Dependent Is this a change from baseline?: Pre-admission baseline Does the patient have difficulty walking or climbing stairs?: Yes Weakness of Legs: Both Weakness of Arms/Hands: None  Permission Sought/Granted Permission sought to share information with : Facility Sport and exercise psychologist, Case Manager                Emotional Assessment Appearance:: Appears stated age Attitude/Demeanor/Rapport: Engaged   Orientation: : Oriented to Place, Oriented to Self      Admission diagnosis:  Altered mental status [R41.82] Acute cystitis without hematuria [N30.00] Multiple falls [R29.6] Altered mental status, unspecified altered mental status type [R41.82] Patient Active Problem List   Diagnosis Date Noted   Hypoglycemia 05/02/2022   C. difficile diarrhea 05/01/2022   Generalized weakness 05/01/2022   GERD (gastroesophageal reflux disease) 04/30/2022   Intertriginous candidiasis 04/30/2022   Altered mental status 04/29/2022   Stage 3b chronic kidney disease (CKD) ([place]) 04/29/2022   Elevated troponin 04/29/2022   UTI (urinary tract infection) 04/29/2022   Dysphagia 12/24/1599   Chronic systolic CHF (  congestive heart failure) ([place]) 09/03/2021   Hypernatremia 09/03/2021   Obstructive uropathy 08/25/2021   CAD (coronary artery disease)    Diabetes mellitus without complication (HCC)    Acute metabolic encephalopathy    Acute renal failure superimposed on  stage 3b chronic kidney disease (HCC)    Paroxysmal atrial fibrillation ([place])    Right ureteral stone    Septic shock due to Escherichia coli ([place]) 11/02/2017   Wide-complex tachycardia 07/11/2017   Aortic stenosis 05/21/2017   Syncope, near 05/19/2017   Chronic diastolic heart failure ([place]) 07/27/2016   Hypertension 07/27/2016   Obstructive sleep apnea 07/27/2016   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

## 2022-05-02 NOTE — Progress Notes (Signed)
Physical Therapy Treatment Patient Details Name: [name] MRN: [id number] DOB: 09-07-1950 Today's Date: 05/02/2022   History of Present Illness 71 year old female with history of hyperlipidemia, insulin-dependent diabetes mellitus, GERD, morbid obesity, mostly bedbound, presented to the ED on 04/29/2022 for evaluation of altered mental status, dysuria and concerns about her leg wounds and redness.  She also reported a productive cough for several months which she attributes to reflux.  Resident at ALF ([facility]) since a rehab stay at [facility] this past summer.  She reports strength and mobility have steadily declined over the last year and she's had several falls.  Now with UTI.    PT Comments    Pt with O2 out of nose on arrival with sats in the low 80s, did not increase to 90s on 1L and per discussion with RN did finally increased to 90s on 2L.  Pt with very limited mobility at baseline (states it's been nearly a year since she could walk more than a few feet).  Pt did not wish to do much mobility this date due to shortness of breath and generally not feeling well.  She did show some effort with LE exercises but has limited ROM and strength available t/o and needed consistent cuing and redirection to participate as cued.  Pt did make a great effort to help scoot herself up in the bed with heavy Trendelenburg bed positioning and UE use on rails, PT assisting with draw sheet and attempt to position LEs as best as possible with available range.    Recommendations for follow up therapy are one component of a multi-disciplinary discharge planning process, led by the attending physician.  Recommendations may be updated based on patient status, additional functional criteria and insurance authorization.  Follow Up Recommendations  Skilled nursing-short term rehab (<3 hours/day) Can patient physically be transported by private vehicle: No   Assistance Recommended at Discharge Frequent or constant  Supervision/Assistance  Patient can return home with the following Two people to help with walking and/or transfers;Two people to help with bathing/dressing/bathroom   Equipment Recommendations  None recommended by PT    Recommendations for Other Services       Precautions / Restrictions Precautions Precautions: Fall Restrictions Weight Bearing Restrictions: No     Mobility  Bed Mobility                    Transfers                        Ambulation/Gait                   Stairs             Wheelchair Mobility    Modified Rankin (Stroke Patients Only)       Balance                                            Cognition Arousal/Alertness: Awake/alert Behavior During Therapy: WFL for tasks assessed/performed Overall Cognitive Status: Within Functional Limits for tasks assessed                                          Exercises General Exercises - Lower Extremity Ankle Circles/Pumps: PROM, AROM, 10 reps (  pt lacks DF to neutral and c/o pain at anterior ankle junction with gentle PROM) Quad Sets: AROM, 5 reps Heel Slides: PROM, Strengthening, 5 reps (pt with limited available knee ROM R 0-20, L 0-45, both pain limited at end range.  lightly resisted leg ext with more tolerance on L than R) Hip ABduction/ADduction: PROM, AAROM, 10 reps    General Comments General comments (skin integrity, edema, etc.): on arrival to room pt with [place] out of her nose, sats in the mid/low 80s, placed on (1L) and sats did improve slightly but she maintained shortness of breath and SpO2 never reached the 90s.  Discussed with nurse and bumped to 2L and pt was able to maintain SpO2 in the low 90s.      Pertinent Vitals/Pain Pain Assessment Pain Assessment: 0-10 Pain Score: 6  Pain Location: b/l knees with any flexion movement    Home Living                          Prior Function            PT Goals  (current goals can now be found in the care plan section) Progress towards PT goals: Progressing toward goals    Frequency    Min 2X/week      PT Plan Current plan remains appropriate    Co-evaluation              AM-PAC PT "6 Clicks" Mobility   Outcome Measure  Help needed turning from your back to your side while in a flat bed without using bedrails?: A Lot Help needed moving from lying on your back to sitting on the side of a flat bed without using bedrails?: A Lot Help needed moving to and from a bed to a chair (including a wheelchair)?: Total Help needed standing up from a chair using your arms (e.g., wheelchair or bedside chair)?: Total Help needed to walk in hospital room?: Total Help needed climbing 3-5 steps with a railing? : Total 6 Click Score: 8    End of Session Equipment Utilized During Treatment: Oxygen Activity Tolerance: Patient limited by pain Patient left: in bed;with bed alarm set Nurse Communication: Mobility status (O2) PT Visit Diagnosis: Muscle weakness (generalized) (M62.81);History of falling (Z91.81);Difficulty in walking, not elsewhere classified (R26.2);Pain Pain - Right/Left: Right Pain - part of body: Leg     Time: 0093-8182 PT Time Calculation (min) (ACUTE ONLY): 28 min  Charges:  $Therapeutic Exercise: 8-22 mins $Therapeutic Activity: 8-22 mins                     Kreg Shropshire, DPT 05/02/2022, 4:36 PM

## 2022-05-03 ENCOUNTER — Encounter: Payer: Self-pay | Admitting: Internal Medicine

## 2022-05-03 DIAGNOSIS — G9341 Metabolic encephalopathy: Secondary | ICD-10-CM | POA: Diagnosis not present

## 2022-05-03 DIAGNOSIS — N3000 Acute cystitis without hematuria: Secondary | ICD-10-CM | POA: Diagnosis not present

## 2022-05-03 DIAGNOSIS — A0472 Enterocolitis due to Clostridium difficile, not specified as recurrent: Secondary | ICD-10-CM | POA: Diagnosis not present

## 2022-05-03 LAB — GLUCOSE, CAPILLARY
Glucose-Capillary: 87 mg/dL (ref 70–99)
Glucose-Capillary: 112 mg/dL — ABNORMAL HIGH (ref 70–99)
Glucose-Capillary: 108 mg/dL — ABNORMAL HIGH (ref 70–99)
Glucose-Capillary: 129 mg/dL — ABNORMAL HIGH (ref 70–99)

## 2022-05-03 NOTE — Progress Notes (Signed)
Progress Note    [name]  ERD:[id number] DOB: Oct 12, 1950  DOA: 04/29/2022 PCP: [facility]      Brief Narrative:    Medical records reviewed and are as summarized below:    [name] is a 71 year old female with history of hyperlipidemia, insulin-dependent diabetes mellitus, GERD, morbid obesity, mostly bedbound, cannot sit on edge of bed, presented to the ED on 04/29/2022 for evaluation of altered mental status, dysuria and concerns about her leg wounds and redness.  She also reported a productive cough for several months which she attributes to reflux.    Pt lives in [place] ([facility] since a rehab stay at [facility] this past summer.  She reports strength and mobility have recently declined again, and she's had several falls.  PT/OT evaluations ordered.   Evaluation revealed likely UTI, and there is concern for cellulitis related to her b/l leg wounds.  Started on empiric Rocephin pending urine culture results.          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 difficile diarrhea   Generalized weakness   Elevated troponin   Chronic systolic CHF (congestive heart failure) (HCC)   Diabetes mellitus without complication (HCC)   Paroxysmal atrial fibrillation (HCC)   Morbid obesity (HCC)   CAD (coronary artery disease)   OSA on CPAP   Stage 3b chronic kidney disease (CKD) (HCC)   GERD (gastroesophageal reflux disease)   Intertriginous candidiasis   Hypoglycemia    Body mass index is 51.73 kg/m.  (Morbid obesity)   Acute metabolic encephalopathy Improved.  She  presented with altered mental status, likely infection etiology    Generalized weakness Due to chronic deconditioning, morbid obesity, and acute illness exacerbating baseline weakness.  Pt resides at ALF. PT and OT recommended discharge to  SNF  C. difficile diarrhea Pt noted to have fecal incontinence with loose / water stools.  C diff tested, showed positive antigen, but negative toxin.  Given symptomatic, will proceed with treatment. 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  Due to demand ischemia in setting of acute illness. No chest pain or acute ischemic EKG changes. Started on empiric heparin on admission but this has been discontinued.    Chronic systolic CHF (congestive heart failure) ([place]) Compensated Echo from 08/27/21 -- EF 40-45%, indeterminate diastolic parameters, mild MR Continue Lasix     Paroxysmal atrial fibrillation (HCC) Heart rate is controlled.  Continue Eliquis   Diabetes mellitus without complication ([place]), recent hypoglycemia Continue Levemir and NovoLog and adjust insulin as needed  Other comorbidities include OSA on CPAP, CAD, has metastases)/GERD, CKD stage IIIb    Intertriginous candidiasis Continue nystatin powder        Diet Order             Diet heart healthy/carb modified Room service appropriate? Yes; Fluid consistency: Thin  Diet effective now  Consultants: None  Procedures: None    Medications:    apixaban  5 mg Oral BID   atorvastatin  40 mg Oral q1800   feeding supplement (PRO-STAT SUGAR FREE 64)  30 mL Oral BID   fenofibrate  160 mg Oral Daily   furosemide  20 mg Oral Daily   insulin aspart  0-5 Units Subcutaneous QHS   insulin aspart  0-9 Units Subcutaneous TID WC   insulin detemir  60 Units Subcutaneous Daily   mirabegron ER  50 mg Oral Daily   nystatin   Topical  TID   pantoprazole  40 mg Oral Daily   vancomycin  125 mg Oral QID   Continuous Infusions:   Anti-infectives (From admission, onward)    Start     Dose/Rate Route Frequency Ordered Stop   05/01/22 1400  vancomycin (VANCOCIN) capsule 125 mg        125 mg Oral 4 times daily 05/01/22 1230 05/11/22 1359   05/01/22 1000  vancomycin (VANCOCIN) capsule 125 mg  Status:  Discontinued        125 mg Oral 4 times daily 05/01/22 0847 05/01/22 0847   04/30/22 1400  cefTRIAXone (ROCEPHIN) 2 g in sodium chloride 0.9 % 100 mL IVPB  Status:  Discontinued        2 g 200 mL/hr over 30 Minutes Intravenous Every 24 hours 04/29/22 1902 05/02/22 0741   04/29/22 1345  cefTRIAXone (ROCEPHIN) 1 g in sodium chloride 0.9 % 100 mL IVPB        1 g 200 mL/hr over 30 Minutes Intravenous  Once 04/29/22 1336 04/29/22 1539              Family Communication/Anticipated D/C date and plan/Code Status   DVT prophylaxis: Place TED hose Start: 04/29/22 1351 apixaban (ELIQUIS) tablet 5 mg     Code Status: Full Code  Family Communication: Plan discussed with [name]. cousin Disposition Plan: Plan to discharge to SNF   Status is: Inpatient Remains inpatient appropriate because: Awaiting placement to SNF       Subjective:   Interval events noted today.  No diarrhea today.  Objective:    Vitals:   05/02/22 1533 05/02/22 2014 05/03/22 0548 05/03/22 0700  BP: (!) 123/59 (!) 147/57 (!) 140/66 127/61  Pulse: (!) 59 61 64 60  Resp: 20 18 20 20   Temp: 98.4 F (36.9 C) 98.7 F (37.1 C) 99.5 F (37.5 C) 98.5 F (36.9 C)  TempSrc:    Oral  SpO2: 91% 90% 91% 95%  Weight:      Height:       No data found.   Intake/Output Summary (Last 24 hours) at 05/03/2022 1526 Last data filed at 05/02/2022 2016 Gross per 24 hour  Intake --  Output 700 ml  Net -700 ml   Filed Weights   04/29/22 1131  Weight: (!) 141 kg    Exam:  GEN: NAD SKIN: Warm and dry EYES: No pallor or icterus ENT:  MMM CV: RRR PULM: CTA B ABD: soft, obese, NT, +BS CNS: AAO x 3, non focal EXT: Chronic erythematous changes and wounds on b/l legs     Data Reviewed:   I have personally reviewed following labs and imaging studies:  Labs: Labs show the following:   Basic Metabolic Panel: Recent Labs  Lab 04/28/22 1514 04/29/22 1134 04/29/22 1401 04/30/22 0647 05/01/22 0354 05/02/22 0445  NA 139 145  --  144 141 142  K 3.8 4.2  --  4.0 3.9 3.9  CL 104 106  --  108 107 107  CO2 27 29  --  26 28 28   GLUCOSE 140* 130*  --  161* 110* 65*  BUN 39* 42*  --  40* 43* 41*  CREATININE 1.66* 1.75*  --  1.78* 1.87* 1.68*  CALCIUM 8.5* 8.9  --  8.6* 8.4* 8.5*  MG 2.1  --  2.2  --   --  2.1  PHOS  --   --  3.5  --   --   --    GFR Estimated Creatinine Clearance: 43.9 mL/min (A) (by C-G formula based on SCr of 1.68 mg/dL (H)). Liver Function Tests: Recent Labs  Lab 04/28/22 1514 04/29/22 1134  AST 39 46*  ALT 25 25  ALKPHOS 63 59  BILITOT 1.0 1.4*  PROT 7.0 7.5  ALBUMIN 3.2* 3.3*   No results for input(s): "LIPASE", "AMYLASE" in the last 168 hours. No results for input(s): "AMMONIA" in the last 168 hours. Coagulation profile Recent Labs  Lab 04/29/22 1548  INR 1.8*    CBC: Recent Labs  Lab 04/28/22 1514 04/29/22 1134 04/30/22 0647 05/01/22 0354 05/02/22 0445  WBC 7.6 7.7 6.6 7.3 7.7  NEUTROABS 6.4 6.5  --   --   --   HGB 11.7* 11.6* 10.6* 11.3* 11.1*  HCT 39.3 39.1 35.4* 38.0 36.8  MCV 86.2 86.3 85.3 85.0 84.0  PLT 228 226 214 267 199   Cardiac Enzymes: No results for input(s): "CKTOTAL", "CKMB", "CKMBINDEX", "TROPONINI" in the last 168 hours. BNP (last 3 results) No results for input(s): "PROBNP" in the last 8760 hours. CBG: Recent Labs  Lab 05/02/22 1137 05/02/22 1528 05/02/22 2111 05/03/22 0732 05/03/22 1233  GLUCAP 140* 168* 158* 87 112*   D-Dimer: No results for input(s): "DDIMER" in the last 72 hours. Hgb A1c: No results for input(s): "HGBA1C" in the  last 72 hours. Lipid Profile: No results for input(s): "CHOL", "HDL", "LDLCALC", "TRIG", "CHOLHDL", "LDLDIRECT" in the last 72 hours. Thyroid function studies: No results for input(s): "TSH", "T4TOTAL", "T3FREE", "THYROIDAB" in the last 72 hours.  Invalid input(s): "FREET3" Anemia work up: No results for input(s): "VITAMINB12", "FOLATE", "FERRITIN", "TIBC", "IRON", "RETICCTPCT" in the last 72 hours. Sepsis Labs: Recent Labs  Lab 04/29/22 1134 04/29/22 1401 04/30/22 0647 05/01/22 0354 05/02/22 0445  PROCALCITON  --  <0.10  --   --   --   WBC 7.7  --  6.6 7.3 7.7  LATICACIDVEN  --  1.5  --  1.2  --     Microbiology Recent Results (from the past 240 hour(s))  Resp Panel by RT-PCR (Flu A&B, Covid) Anterior Nasal Swab     Status: None   Collection Time: 04/28/22  3:14 PM   Specimen: Anterior Nasal Swab  Result Value Ref Range Status   SARS Coronavirus 2 by RT PCR NEGATIVE NEGATIVE Final    Comment: (NOTE) SARS-CoV-2 target nucleic acids are NOT DETECTED.  The SARS-CoV-2 RNA is generally detectable in upper respiratory specimens during the acute phase of infection. The lowest concentration of SARS-CoV-2 viral copies this assay can detect is 138 copies/mL. A negative result does not preclude SARS-Cov-2 infection and should not be used as the sole basis for treatment or other patient management decisions. A negative result may occur with  improper specimen collection/handling, submission of specimen other than nasopharyngeal swab, presence of viral mutation(s) within the areas targeted by this assay, and inadequate number of viral copies(<138 copies/mL). A negative result  must be combined with clinical observations, patient history, and epidemiological information. The expected result is Negative.  Fact Sheet for Patients:  EntrepreneurPulse.com.au  Fact Sheet for Healthcare Providers:  IncredibleEmployment.be  This test is no t yet  approved or cleared by the [place] FDA and  has been authorized for detection and/or diagnosis of SARS-CoV-2 by FDA under an Emergency Use Authorization (EUA). This EUA will remain  in effect (meaning this test can be used) for the duration of the COVID-19 declaration under Section 564(b)(1) of the Act, 21 U.S.C.section 360bbb-3(b)(1), unless the authorization is terminated  or revoked sooner.       Influenza A by PCR NEGATIVE NEGATIVE Final   Influenza B by PCR NEGATIVE NEGATIVE Final    Comment: (NOTE) The Xpert Xpress SARS-CoV-2/FLU/RSV plus assay is intended as an aid in the diagnosis of influenza from Nasopharyngeal swab specimens and should not be used as a sole basis for treatment. Nasal washings and aspirates are unacceptable for Xpert Xpress SARS-CoV-2/FLU/RSV testing.  Fact Sheet for Patients: EntrepreneurPulse.com.au  Fact Sheet for Healthcare Providers: IncredibleEmployment.be  This test is not yet approved or cleared by the [place] FDA and has been authorized for detection and/or diagnosis of SARS-CoV-2 by FDA under an Emergency Use Authorization (EUA). This EUA will remain in effect (meaning this test can be used) for the duration of the COVID-19 declaration under Section 564(b)(1) of the Act, 21 U.S.C. section 360bbb-3(b)(1), unless the authorization is terminated or revoked.  Performed at [facility], [address] [place], [place] [address]   Resp Panel by RT-PCR (Flu A&B, Covid) Anterior Nasal Swab     Status: None   Collection Time: 04/29/22  2:01 PM   Specimen: Anterior Nasal Swab  Result Value Ref Range Status   SARS Coronavirus 2 by RT PCR NEGATIVE NEGATIVE Final    Comment: (NOTE) SARS-CoV-2 target nucleic acids are NOT DETECTED.  The SARS-CoV-2 RNA is generally detectable in upper respiratory specimens during the acute phase of infection. The lowest concentration of SARS-CoV-2 viral  copies this assay can detect is 138 copies/mL. A negative result does not preclude SARS-Cov-2 infection and should not be used as the sole basis for treatment or other patient management decisions. A negative result may occur with  improper specimen collection/handling, submission of specimen other than nasopharyngeal swab, presence of viral mutation(s) within the areas targeted by this assay, and inadequate number of viral copies(<138 copies/mL). A negative result must be combined with clinical observations, patient history, and epidemiological information. The expected result is Negative.  Fact Sheet for Patients:  EntrepreneurPulse.com.au  Fact Sheet for Healthcare Providers:  IncredibleEmployment.be  This test is no t yet approved or cleared by the [place] FDA and  has been authorized for detection and/or diagnosis of SARS-CoV-2 by FDA under an Emergency Use Authorization (EUA). This EUA will remain  in effect (meaning this test can be used) for the duration of the COVID-19 declaration under Section 564(b)(1) of the Act, 21 U.S.C.section 360bbb-3(b)(1), unless the authorization is terminated  or revoked sooner.       Influenza A by PCR NEGATIVE NEGATIVE Final   Influenza B by PCR NEGATIVE NEGATIVE Final    Comment: (NOTE) The Xpert Xpress SARS-CoV-2/FLU/RSV plus assay is intended as an aid in the diagnosis of influenza from Nasopharyngeal swab specimens and should not be used as a sole basis for treatment. Nasal washings and aspirates are unacceptable for Xpert Xpress SARS-CoV-2/FLU/RSV testing.  Fact Sheet for Patients: EntrepreneurPulse.com.au  Fact Sheet for Healthcare Providers: IncredibleEmployment.be  This test is not yet approved or cleared by the [place] FDA and has been authorized for detection and/or diagnosis of SARS-CoV-2 by FDA under an Emergency Use Authorization (EUA). This  EUA will remain in effect (meaning this test can be used) for the duration of the COVID-19 declaration under Section 564(b)(1) of the Act, 21 U.S.C. section 360bbb-3(b)(1), unless the authorization is terminated or revoked.  Performed at [facility], [address] [place], [place] [address]   C Difficile Quick Screen w PCR reflex     Status: Abnormal   Collection Time: 04/30/22 12:07 PM   Specimen: STOOL  Result Value Ref Range Status   C Diff antigen POSITIVE (A) NEGATIVE Final   C Diff toxin NEGATIVE NEGATIVE Final   C Diff interpretation Results are indeterminate. See PCR results.  Final    Comment: Performed at [facility], [address] [place], [place] [address]  C. Diff by PCR, Reflexed     Status: Abnormal   Collection Time: 04/30/22 12:07 PM  Result Value Ref Range Status   Toxigenic C. Difficile by PCR POSITIVE (A) NEGATIVE Final    Comment: Positive for toxigenic C. difficile with little to no toxin production. Only treat if clinical presentation suggests symptomatic illness. Performed at [facility], [address] [place], [place] [address]   Culture, blood (Routine X 2) w Reflex to ID Panel     Status: None (Preliminary result)   Collection Time: 05/01/22  3:54 AM   Specimen: BLOOD LEFT HAND  Result Value Ref Range Status   Specimen Description BLOOD LEFT HAND  Final   Special Requests   Final    BOTTLES DRAWN AEROBIC AND ANAEROBIC Blood Culture adequate volume   Culture   Final    NO GROWTH 2 DAYS Performed at [facility], [address], [place], [place] [address]    Report Status PENDING  Incomplete  Culture, blood (Routine X 2) w Reflex to ID Panel     Status: None (Preliminary result)   Collection Time: 05/01/22  4:01 AM   Specimen: BLOOD LEFT HAND  Result Value Ref Range Status   Specimen Description BLOOD LEFT HAND  Final   Special Requests   Final    BOTTLES DRAWN AEROBIC AND ANAEROBIC Blood Culture  adequate volume   Culture   Final    NO GROWTH 2 DAYS Performed at [facility], [address], [place], [place] [address]    Report Status PENDING  Incomplete    Procedures and diagnostic studies:  No results found.             LOS: 4 days   Jevaun Strick  [facility]   Pager on [email or web address]. If 7PM-7AM, please contact night-coverage at [email or web address]     05/03/2022, 3:26 PM

## 2022-05-03 NOTE — Plan of Care (Signed)

## 2022-05-04 DIAGNOSIS — J9611 Chronic respiratory failure with hypoxia: Secondary | ICD-10-CM | POA: Diagnosis not present

## 2022-05-04 DIAGNOSIS — A0472 Enterocolitis due to Clostridium difficile, not specified as recurrent: Secondary | ICD-10-CM | POA: Diagnosis not present

## 2022-05-04 DIAGNOSIS — I480 Paroxysmal atrial fibrillation: Secondary | ICD-10-CM | POA: Diagnosis not present

## 2022-05-04 DIAGNOSIS — G9341 Metabolic encephalopathy: Secondary | ICD-10-CM | POA: Diagnosis not present

## 2022-05-04 LAB — GLUCOSE, CAPILLARY
Glucose-Capillary: 83 mg/dL (ref 70–99)
Glucose-Capillary: 144 mg/dL — ABNORMAL HIGH (ref 70–99)
Glucose-Capillary: 132 mg/dL — ABNORMAL HIGH (ref 70–99)
Glucose-Capillary: 132 mg/dL — ABNORMAL HIGH (ref 70–99)

## 2022-05-04 MED ORDER — BENZOCAINE 10 % MT GEL: Freq: Three times a day (TID) | OROMUCOSAL | Status: AC | PRN

## 2022-05-04 NOTE — Progress Notes (Addendum)
Progress Note    [name]  GNF:[id number] DOB: May 20, 1951  DOA: 04/29/2022 PCP: [facility]      Brief Narrative:    Medical records reviewed and are as summarized below:    [name] is a 71 year old female with history of hyperlipidemia, insulin-dependent diabetes mellitus, GERD, morbid obesity, mostly bedbound, cannot sit on edge of bed, presented to the ED on 04/29/2022 for evaluation of altered mental status, dysuria and concerns about her leg wounds and redness.  She also reported a productive cough for several months which she attributes to reflux.    Pt lives in [place] ([facility] [place] since a rehab stay at [facility] this past summer.  She reports strength and mobility have recently declined again, and she's had several falls.  PT/OT evaluations ordered.   Evaluation revealed likely UTI, and there is concern for cellulitis related to her b/l leg wounds.  Started on empiric Rocephin pending urine culture results.          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 difficile diarrhea   Generalized weakness   Elevated troponin   Chronic systolic CHF (congestive heart failure) (HCC)   Diabetes mellitus without complication (HCC)   Paroxysmal atrial fibrillation (HCC)   Morbid obesity (HCC)   CAD (coronary artery disease)   OSA on CPAP   Stage 3b chronic kidney disease (CKD) (HCC)   GERD (gastroesophageal reflux disease)   Intertriginous candidiasis   Hypoglycemia   Chronic respiratory failure with hypoxia (HCC)    Body mass index is 51.73 kg/m.  (Morbid obesity)   Acute metabolic encephalopathy Improved.  She  presented with altered mental status, likely infection etiology    Generalized weakness Due to chronic deconditioning, morbid obesity, and acute illness exacerbating baseline weakness.  Pt  resides at ALF. PT and OT recommended discharge to SNF  C. difficile diarrhea Pt noted to have fecal incontinence with loose / water stools.  C diff tested, showed positive antigen, but negative toxin.  Given symptomatic, will proceed with treatment. Continue oral vancomycin through 05/11/2022. She was started on oral vancomycin on 05/01/2022   UTI (urinary tract infection) - Completed 3 days Rocephin, now asymptomatic --Urine culture was never sent --Monitor for symptoms  Hypoxia, likely chronic hypoxic respiratory failure Oxygen saturation went down to 84% on room on 05/04/2022.  She was asymptomatic from this.  She was placed back on 2 L/min oxygen and oxygen saturation improved to 94%.  She will likely need home oxygen.   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  Due to demand ischemia in setting of acute illness. No chest pain or acute ischemic EKG changes. Started on empiric heparin on admission but this has been discontinued.    Chronic systolic CHF (congestive heart failure) ([place]) Compensated Echo from 08/27/21 -- EF 40-45%, indeterminate diastolic parameters, mild MR Continue Lasix     Paroxysmal atrial fibrillation (HCC) Heart rate is controlled.  Continue Eliquis   Diabetes mellitus without complication ([place]), recent hypoglycemia Continue Levemir and NovoLog and adjust insulin as needed   Intertriginous candidiasis Continue nystatin powder   Other comorbidities include OSA on CPAP, CAD, has metastases)/GERD, CKD stage IIIb  Diet Order             Diet heart healthy/carb modified Room service appropriate? Yes; Fluid consistency: Thin  Diet effective now                            Consultants: None  Procedures: None    Medications:    apixaban  5 mg Oral BID    atorvastatin  40 mg Oral q1800   feeding supplement (PRO-STAT SUGAR FREE 64)  30 mL Oral BID   fenofibrate  160 mg Oral Daily   furosemide  20 mg Oral Daily   insulin aspart  0-5 Units Subcutaneous QHS   insulin aspart  0-9 Units Subcutaneous TID WC   insulin detemir  60 Units Subcutaneous Daily   mirabegron ER  50 mg Oral Daily   nystatin   Topical TID   pantoprazole  40 mg Oral Daily   vancomycin  125 mg Oral QID   Continuous Infusions:   Anti-infectives (From admission, onward)    Start     Dose/Rate Route Frequency Ordered Stop   05/01/22 1400  vancomycin (VANCOCIN) capsule 125 mg        125 mg Oral 4 times daily 05/01/22 1230 05/11/22 1359   05/01/22 1000  vancomycin (VANCOCIN) capsule 125 mg  Status:  Discontinued        125 mg Oral 4 times daily 05/01/22 0847 05/01/22 0847   04/30/22 1400  cefTRIAXone (ROCEPHIN) 2 g in sodium chloride 0.9 % 100 mL IVPB  Status:  Discontinued        2 g 200 mL/hr over 30 Minutes Intravenous Every 24 hours 04/29/22 1902 05/02/22 0741   04/29/22 1345  cefTRIAXone (ROCEPHIN) 1 g in sodium chloride 0.9 % 100 mL IVPB        1 g 200 mL/hr over 30 Minutes Intravenous  Once 04/29/22 1336 04/29/22 1539              Family Communication/Anticipated D/C date and plan/Code Status   DVT prophylaxis: Place TED hose Start: 04/29/22 1351 apixaban (ELIQUIS) tablet 5 mg     Code Status: Full Code  Family Communication: Plan discussed with [name]. cousin Disposition Plan: Plan to discharge to SNF   Status is: Inpatient Remains inpatient appropriate because: Awaiting placement to SNF       Subjective:   Interval events noted.  She had small amount of brown stools this morning.  Overall she feels better.  No chest pain or shortness of breath.  Delcie Roch, RN, was at the bedside.  She said patient could not be weaned off oxygen because oxygen saturation was in the 80s according to the report she got from the night nurse  Objective:     Vitals:   05/04/22 0345 05/04/22 0833 05/04/22 1035 05/04/22 1036  BP: (!) 138/55 (!) 141/60    Pulse: (!) 58 60    Resp: 19 16    Temp: 98.2 F (36.8 C) 98.1 F (36.7 C)    TempSrc:      SpO2: 94% 95% (!) 84% 94%  Weight:      Height:       No data found.   Intake/Output Summary (Last 24 hours) at 05/04/2022 1128 Last data filed at 05/03/2022 2245 Gross per 24 hour  Intake --  Output 600 ml  Net -600 ml   Filed Weights   04/29/22 1131  Weight: (!) 141 kg  Exam:  GEN: NAD SKIN: Warm and dry.  Chronic erythematous changes and wounds on bilateral legs EYES: EOMI ENT: MMM CV: RRR PULM: CTA B ABD: soft, obese, NT, +BS CNS: AAO x 3, non focal EXT: No edema or tenderness      Data Reviewed:   I have personally reviewed following labs and imaging studies:  Labs: Labs show the following:   Basic Metabolic Panel: Recent Labs  Lab 04/28/22 1514 04/29/22 1134 04/29/22 1401 04/30/22 0647 05/01/22 0354 05/02/22 0445  NA 139 145  --  144 141 142  K 3.8 4.2  --  4.0 3.9 3.9  CL 104 106  --  108 107 107  CO2 27 29  --  26 28 28   GLUCOSE 140* 130*  --  161* 110* 65*  BUN 39* 42*  --  40* 43* 41*  CREATININE 1.66* 1.75*  --  1.78* 1.87* 1.68*  CALCIUM 8.5* 8.9  --  8.6* 8.4* 8.5*  MG 2.1  --  2.2  --   --  2.1  PHOS  --   --  3.5  --   --   --    GFR Estimated Creatinine Clearance: 43.9 mL/min (A) (by C-G formula based on SCr of 1.68 mg/dL (H)). Liver Function Tests: Recent Labs  Lab 04/28/22 1514 04/29/22 1134  AST 39 46*  ALT 25 25  ALKPHOS 63 59  BILITOT 1.0 1.4*  PROT 7.0 7.5  ALBUMIN 3.2* 3.3*   No results for input(s): "LIPASE", "AMYLASE" in the last 168 hours. No results for input(s): "AMMONIA" in the last 168 hours. Coagulation profile Recent Labs  Lab 04/29/22 1548  INR 1.8*    CBC: Recent Labs  Lab 04/28/22 1514 04/29/22 1134 04/30/22 0647 05/01/22 0354 05/02/22 0445  WBC 7.6 7.7 6.6 7.3 7.7  NEUTROABS 6.4 6.5  --    --   --   HGB 11.7* 11.6* 10.6* 11.3* 11.1*  HCT 39.3 39.1 35.4* 38.0 36.8  MCV 86.2 86.3 85.3 85.0 84.0  PLT 228 226 214 267 199   Cardiac Enzymes: No results for input(s): "CKTOTAL", "CKMB", "CKMBINDEX", "TROPONINI" in the last 168 hours. BNP (last 3 results) No results for input(s): "PROBNP" in the last 8760 hours. CBG: Recent Labs  Lab 05/03/22 0732 05/03/22 1233 05/03/22 1706 05/03/22 1944 05/04/22 0835  GLUCAP 87 112* 108* 129* 83   D-Dimer: No results for input(s): "DDIMER" in the last 72 hours. Hgb A1c: No results for input(s): "HGBA1C" in the last 72 hours. Lipid Profile: No results for input(s): "CHOL", "HDL", "LDLCALC", "TRIG", "CHOLHDL", "LDLDIRECT" in the last 72 hours. Thyroid function studies: No results for input(s): "TSH", "T4TOTAL", "T3FREE", "THYROIDAB" in the last 72 hours.  Invalid input(s): "FREET3" Anemia work up: No results for input(s): "VITAMINB12", "FOLATE", "FERRITIN", "TIBC", "IRON", "RETICCTPCT" in the last 72 hours. Sepsis Labs: Recent Labs  Lab 04/29/22 1134 04/29/22 1401 04/30/22 0647 05/01/22 0354 05/02/22 0445  PROCALCITON  --  <0.10  --   --   --   WBC 7.7  --  6.6 7.3 7.7  LATICACIDVEN  --  1.5  --  1.2  --     Microbiology Recent Results (from the past 240 hour(s))  Resp Panel by RT-PCR (Flu A&B, Covid) Anterior Nasal Swab     Status: None   Collection Time: 04/28/22  3:14 PM   Specimen: Anterior Nasal Swab  Result Value Ref Range Status   SARS Coronavirus 2 by RT PCR NEGATIVE NEGATIVE Final  Comment: (NOTE) SARS-CoV-2 target nucleic acids are NOT DETECTED.  The SARS-CoV-2 RNA is generally detectable in upper respiratory specimens during the acute phase of infection. The lowest concentration of SARS-CoV-2 viral copies this assay can detect is 138 copies/mL. A negative result does not preclude SARS-Cov-2 infection and should not be used as the sole basis for treatment or other patient management decisions. A negative  result may occur with  improper specimen collection/handling, submission of specimen other than nasopharyngeal swab, presence of viral mutation(s) within the areas targeted by this assay, and inadequate number of viral copies(<138 copies/mL). A negative result must be combined with clinical observations, patient history, and epidemiological information. The expected result is Negative.  Fact Sheet for Patients:  EntrepreneurPulse.com.au  Fact Sheet for Healthcare Providers:  IncredibleEmployment.be  This test is no t yet approved or cleared by the [place] FDA and  has been authorized for detection and/or diagnosis of SARS-CoV-2 by FDA under an Emergency Use Authorization (EUA). This EUA will remain  in effect (meaning this test can be used) for the duration of the COVID-19 declaration under Section 564(b)(1) of the Act, 21 U.S.C.section 360bbb-3(b)(1), unless the authorization is terminated  or revoked sooner.       Influenza A by PCR NEGATIVE NEGATIVE Final   Influenza B by PCR NEGATIVE NEGATIVE Final    Comment: (NOTE) The Xpert Xpress SARS-CoV-2/FLU/RSV plus assay is intended as an aid in the diagnosis of influenza from Nasopharyngeal swab specimens and should not be used as a sole basis for treatment. Nasal washings and aspirates are unacceptable for Xpert Xpress SARS-CoV-2/FLU/RSV testing.  Fact Sheet for Patients: EntrepreneurPulse.com.au  Fact Sheet for Healthcare Providers: IncredibleEmployment.be  This test is not yet approved or cleared by the [place] FDA and has been authorized for detection and/or diagnosis of SARS-CoV-2 by FDA under an Emergency Use Authorization (EUA). This EUA will remain in effect (meaning this test can be used) for the duration of the COVID-19 declaration under Section 564(b)(1) of the Act, 21 U.S.C. section 360bbb-3(b)(1), unless the authorization is  terminated or revoked.  Performed at [facility], [address] [place], [place] [address]   Resp Panel by RT-PCR (Flu A&B, Covid) Anterior Nasal Swab     Status: None   Collection Time: 04/29/22  2:01 PM   Specimen: Anterior Nasal Swab  Result Value Ref Range Status   SARS Coronavirus 2 by RT PCR NEGATIVE NEGATIVE Final    Comment: (NOTE) SARS-CoV-2 target nucleic acids are NOT DETECTED.  The SARS-CoV-2 RNA is generally detectable in upper respiratory specimens during the acute phase of infection. The lowest concentration of SARS-CoV-2 viral copies this assay can detect is 138 copies/mL. A negative result does not preclude SARS-Cov-2 infection and should not be used as the sole basis for treatment or other patient management decisions. A negative result may occur with  improper specimen collection/handling, submission of specimen other than nasopharyngeal swab, presence of viral mutation(s) within the areas targeted by this assay, and inadequate number of viral copies(<138 copies/mL). A negative result must be combined with clinical observations, patient history, and epidemiological information. The expected result is Negative.  Fact Sheet for Patients:  EntrepreneurPulse.com.au  Fact Sheet for Healthcare Providers:  IncredibleEmployment.be  This test is no t yet approved or cleared by the [place] FDA and  has been authorized for detection and/or diagnosis of SARS-CoV-2 by FDA under an Emergency Use Authorization (EUA). This EUA will remain  in effect (meaning this test can  be used) for the duration of the COVID-19 declaration under Section 564(b)(1) of the Act, 21 U.S.C.section 360bbb-3(b)(1), unless the authorization is terminated  or revoked sooner.       Influenza A by PCR NEGATIVE NEGATIVE Final   Influenza B by PCR NEGATIVE NEGATIVE Final    Comment: (NOTE) The Xpert Xpress SARS-CoV-2/FLU/RSV plus assay is  intended as an aid in the diagnosis of influenza from Nasopharyngeal swab specimens and should not be used as a sole basis for treatment. Nasal washings and aspirates are unacceptable for Xpert Xpress SARS-CoV-2/FLU/RSV testing.  Fact Sheet for Patients: EntrepreneurPulse.com.au  Fact Sheet for Healthcare Providers: IncredibleEmployment.be  This test is not yet approved or cleared by the [place] FDA and has been authorized for detection and/or diagnosis of SARS-CoV-2 by FDA under an Emergency Use Authorization (EUA). This EUA will remain in effect (meaning this test can be used) for the duration of the COVID-19 declaration under Section 564(b)(1) of the Act, 21 U.S.C. section 360bbb-3(b)(1), unless the authorization is terminated or revoked.  Performed at [facility], [address] [place], [place] [address]   C Difficile Quick Screen w PCR reflex     Status: Abnormal   Collection Time: 04/30/22 12:07 PM   Specimen: STOOL  Result Value Ref Range Status   C Diff antigen POSITIVE (A) NEGATIVE Final   C Diff toxin NEGATIVE NEGATIVE Final   C Diff interpretation Results are indeterminate. See PCR results.  Final    Comment: Performed at [facility], [address] [place], [place] [address]  C. Diff by PCR, Reflexed     Status: Abnormal   Collection Time: 04/30/22 12:07 PM  Result Value Ref Range Status   Toxigenic C. Difficile by PCR POSITIVE (A) NEGATIVE Final    Comment: Positive for toxigenic C. difficile with little to no toxin production. Only treat if clinical presentation suggests symptomatic illness. Performed at [facility], [address] [place], [place] [address]   Culture, blood (Routine X 2) w Reflex to ID Panel     Status: None (Preliminary result)   Collection Time: 05/01/22  3:54 AM   Specimen: BLOOD LEFT HAND  Result Value Ref Range Status   Specimen Description BLOOD LEFT HAND   Final   Special Requests   Final    BOTTLES DRAWN AEROBIC AND ANAEROBIC Blood Culture adequate volume   Culture   Final    NO GROWTH 3 DAYS Performed at [facility], [address] [place], [place] [address]    Report Status PENDING  Incomplete  Culture, blood (Routine X 2) w Reflex to ID Panel     Status: None (Preliminary result)   Collection Time: 05/01/22  4:01 AM   Specimen: BLOOD LEFT HAND  Result Value Ref Range Status   Specimen Description BLOOD LEFT HAND  Final   Special Requests   Final    BOTTLES DRAWN AEROBIC AND ANAEROBIC Blood Culture adequate volume   Culture   Final    NO GROWTH 3 DAYS Performed at [facility], [address], [place], [place] [address]    Report Status PENDING  Incomplete  Urine Culture     Status: None (Preliminary result)   Collection Time: 05/02/22 11:30 PM   Specimen: Urine, Clean Catch  Result Value Ref Range Status   Specimen Description   Final    URINE, CLEAN CATCH Performed at [facility], [address], [place], [place] [address]  Special Requests   Final    NONE Performed at [facility], [address] [place], [place] [address]    Culture   Final    CULTURE REINCUBATED FOR BETTER GROWTH Performed at [facility], [place] [address] [place], [place] [address]    Report Status PENDING  Incomplete    Procedures and diagnostic studies:  No results found.             LOS: 5 days   Rhealyn Cullen  [facility]   Pager on [email or web address]. If 7PM-7AM, please contact night-coverage at [email or web address]     05/04/2022, 11:28 AM

## 2022-05-04 NOTE — Progress Notes (Signed)
Physical Therapy Treatment Patient Details Name: [name] MRN: [id number] DOB: [birth year] Today's Date: 05/04/2022   History of Present Illness 71 year old female with history of hyperlipidemia, insulin-dependent diabetes mellitus, GERD, morbid obesity, mostly bedbound, presented to the ED on 04/29/2022 for evaluation of altered mental status, dysuria and concerns about her leg wounds and redness.  She also reported a productive cough for several months which she attributes to reflux.  Resident at ALF ([facility]) since a rehab stay at [facility] this past summer.  She reports strength and mobility have steadily declined over the last year and she's had several falls.  Now with UTI.    PT Comments    Pt seen for PT tx with pt's cousin exiting upon PT arrival & pt agreeable to tx. P t is able to transfer supine>sit with HOB fully elevated, use of bed rails, cuing to scoot BLE towards EOB & ultimately max assist to come to sitting upright & fully sitting EOB. Pt tolerates sitting EOB ~10 minutes, engaging in a few BLE strengthening exercises with cuing for technique. Pt c/o dizziness sitting EOB but SpO2 >/= 90% on 2L/min throughout session, BP 139/68 mmHg (MAP 87) in L wrist. Pt engages in scooting along EOB to R with max<>total assist +2 with PT providing max cuing for head/hips relationship, sequencing, & technique. PT attempts to block RLE but pt with c/o during this. Once supine in bed, pt rolled L<>R with max assist +2 for safety with max cuing for BLE/BUE positioning with pt requiring extra time to visually locate & use bed rails. Pt is able to scoot to [facility] with bed in trendelenburg position & BUE (doesn't use BLE to assist despite cuing). Pt would benefit from ongoing acute PT services to address activity tolerance, strengthening, bed mobility & transfers.  Notified team of benefits of air mattress to prevent skin breakdown.    Recommendations for follow up therapy are one component of a  multi-disciplinary discharge planning process, led by the attending physician.  Recommendations may be updated based on patient status, additional functional criteria and insurance authorization.  Follow Up Recommendations  Skilled nursing-short term rehab (<3 hours/day) Can patient physically be transported by private vehicle: No   Assistance Recommended at Discharge Frequent or constant Supervision/Assistance  Patient can return home with the following Two people to help with walking and/or transfers;Two people to help with bathing/dressing/bathroom   Equipment Recommendations  None recommended by PT (TBD in next venue)    Recommendations for Other Services       Precautions / Restrictions Precautions Precautions: Fall Restrictions Weight Bearing Restrictions: No     Mobility  Bed Mobility Overal bed mobility: Needs Assistance Bed Mobility: Rolling Rolling: Max assist, +2 for safety/equipment   Supine to sit: Max assist, HOB elevated Sit to supine: Max assist        Transfers                        Ambulation/Gait                   Stairs             Wheelchair Mobility    Modified Rankin (Stroke Patients Only)       Balance Overall balance assessment: Needs assistance, History of Falls Sitting-balance support: Bilateral upper extremity supported, Feet supported Sitting balance-Leahy Scale: Fair Sitting balance - Comments: supervision static sitting EOB  Cognition Arousal/Alertness: Awake/alert Behavior During Therapy: Anxious Overall Cognitive Status: No family/caregiver present to determine baseline cognitive functioning                                 General Comments: extra time to follow simple commands, poor memory as pt requested to call cousin but couldn't remember why        Exercises General Exercises - Lower Extremity Long Arc Quad: AROM,  Strengthening, Both, 5 reps, Seated    General Comments        Pertinent Vitals/Pain Pain Assessment Pain Assessment: Faces Faces Pain Scale: Hurts even more Pain Location: BLE to touch Pain Descriptors / Indicators: Grimacing, Guarding, Discomfort Pain Intervention(s): Monitored during session, Repositioned    Home Living                          Prior Function            PT Goals (current goals can now be found in the care plan section) Acute Rehab PT Goals Patient Stated Goal: to be able to transfer bed->chair PT Goal Formulation: With patient Time For Goal Achievement: 05/14/22 Potential to Achieve Goals: Fair Progress towards PT goals: Progressing toward goals    Frequency    Min 2X/week      PT Plan Current plan remains appropriate    Co-evaluation              AM-PAC PT "6 Clicks" Mobility   Outcome Measure  Help needed turning from your back to your side while in a flat bed without using bedrails?: Total Help needed moving from lying on your back to sitting on the side of a flat bed without using bedrails?: Total Help needed moving to and from a bed to a chair (including a wheelchair)?: Total Help needed standing up from a chair using your arms (e.g., wheelchair or bedside chair)?: Total Help needed to walk in hospital room?: Total Help needed climbing 3-5 steps with a railing? : Total 6 Click Score: 6    End of Session Equipment Utilized During Treatment: Oxygen Activity Tolerance: Patient tolerated treatment well Patient left: in bed;with call bell/phone within reach;with nursing/sitter in room Nurse Communication: Mobility status PT Visit Diagnosis: Muscle weakness (generalized) (M62.81);History of falling (Z91.81);Difficulty in walking, not elsewhere classified (R26.2);Pain Pain - Right/Left: Right Pain - part of body: Leg     Time: 9937-1696 PT Time Calculation (min) (ACUTE ONLY): 28 min  Charges:  $Therapeutic Activity:  23-37 mins                     [name], PT, DPT 05/04/22, 4:21 PM   [name] 05/04/2022, 4:18 PM

## 2022-05-04 NOTE — Progress Notes (Signed)
   05/04/22 1035  Assess: MEWS Score  SpO2 (!) 84 %  O2 Device Room Air  Patient Activity (if Appropriate) In bed   Patient was unable to tolerate weaning off of oxygen. Will need continuous 2L oxygen for home use.

## 2022-05-04 NOTE — TOC Progression Note (Addendum)
Transition of Care [facility]) - Progression Note    Patient Details  Name: [name] MRN: [id number] Date of Birth: 1951-04-20  Transition of Care [facility]) CM/SW [place], [place] Phone Number: 05/04/2022, 3:43 PM  Clinical Narrative:     [facility] sent referral to [facility].  Expected Discharge Plan: [place]    Expected Discharge Plan and Services Expected Discharge Plan: [place]                                               Social Determinants of Health (SDOH) Interventions    Readmission Risk Interventions     No data to display

## 2022-05-04 NOTE — NC FL2 (Signed)
[place] LEVEL OF CARE SCREENING TOOL     IDENTIFICATION  Patient Name: [name] Birthdate: [birth year] Sex: female Admission Date (Current Location): 04/29/2022  [place] and [place] Number:  [name] [id number] [place] and Address:  [facility], [address] [place], [place] [address]      Provider Number: [id number]  Attending Physician Name and Address:  Jennye Boroughs, MD  Relative Name and Phone Number:  [name] [phone]    Current Level of Care: Hospital Recommended Level of Care: [place] Prior Approval Number:    Date Approved/Denied:   PASRR Number: [id number]  Discharge Plan: SNF    Current Diagnoses: Patient Active Problem List   Diagnosis Date Noted   Chronic respiratory failure with hypoxia ([place]) 05/04/2022   Hypoglycemia 05/02/2022   C. difficile diarrhea 05/01/2022   Generalized weakness 05/01/2022   GERD (gastroesophageal reflux disease) 04/30/2022   Intertriginous candidiasis 04/30/2022   Altered mental status 04/29/2022   Stage 3b chronic kidney disease (CKD) ([place]) 04/29/2022   Elevated troponin 04/29/2022   UTI (urinary tract infection) 04/29/2022   Dysphagia 17/40/8144   Chronic systolic CHF (congestive heart failure) ([place]) 09/03/2021   Hypernatremia 09/03/2021   Obstructive uropathy 08/25/2021   CAD (coronary artery disease)    Diabetes mellitus without complication (HCC)    Acute metabolic encephalopathy    Acute renal failure superimposed on stage 3b chronic kidney disease (HCC)    Paroxysmal atrial fibrillation ([place])    Right ureteral stone    Septic shock due to Escherichia coli ([place]) 11/02/2017   Wide-complex tachycardia 07/11/2017   Aortic stenosis 05/21/2017   Syncope, near 05/19/2017   Chronic diastolic heart failure ([place]) 07/27/2016   Hypertension 07/27/2016   Obstructive sleep apnea 07/27/2016   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 07/27/2016   Pressure injury of skin 05/02/2016   Lactic acidosis    Severe aortic stenosis    OSA on CPAP    Morbid obesity ([place])    Community acquired pneumonia     Orientation RESPIRATION BLADDER Height & Weight     Self, Place, Time  O2 (2 Liters of oxygen) External catheter Weight: (!) 310 lb 13.6 oz (141 kg) Height:  5\' 5"  (165.1 cm)  BEHAVIORAL SYMPTOMS/MOOD NEUROLOGICAL BOWEL NUTRITION STATUS   (none)  (AOx2) Continent Diet (heart healthy/carb modified)  AMBULATORY STATUS COMMUNICATION OF NEEDS Skin   Extensive Assist Verbally Skin abrasions, Other (Comment) (abrasion, ecchymosis/erythema/redness: abdomen, arm, leg, blister: bilateral legs, redness: legs, weeping: legs)                       Personal Care Assistance Level of Assistance  Bathing, Feeding, Dressing Bathing Assistance: Maximum assistance Feeding assistance: Limited assistance Dressing Assistance: Maximum assistance     Functional Limitations Info  Sight, Hearing, Speech Sight Info: Impaired Hearing Info: Adequate Speech Info: Adequate    SPECIAL CARE FACTORS FREQUENCY  OT (By licensed OT), PT (By licensed PT)     PT Frequency: 5x a week OT Frequency: 5x a week            Contractures Contractures Info: Not present    Additional Factors Info  Code Status Code Status Info: Full Code             Current Medications (05/04/2022):  This is the current hospital active medication list Current Facility-Administered Medications  Medication Dose Route Frequency Provider Last Rate Last Admin   apixaban (ELIQUIS) tablet 5  mg  5 mg Oral BID Nicole Kindred A, DO   5 mg at 05/04/22 0948   atorvastatin (LIPITOR) tablet 40 mg  40 mg Oral q1800 Cox, Amy N, DO   40 mg at 05/03/22 1711   benzocaine (ORAJEL) 10 % mucosal gel   Mouth/Throat TID PRN Jennye Boroughs, MD       clonazePAM Bobbye [place]) tablet 0.5 mg  0.5 mg Oral BID PRN Cox, Amy N, DO   0.5 mg at 05/04/22 1255   feeding supplement  (PRO-STAT SUGAR FREE 64) liquid 30 mL  30 mL Oral BID Cox, Amy N, DO   30 mL at 05/02/22 1007   fenofibrate tablet 160 mg  160 mg Oral Daily Cox, Amy N, DO   160 mg at 05/03/22 1711   furosemide (LASIX) tablet 20 mg  20 mg Oral Daily Cox, Amy N, DO   20 mg at 05/04/22 0948   insulin aspart (novoLOG) injection 0-5 Units  0-5 Units Subcutaneous QHS Cox, Amy N, DO   2 Units at 04/30/22 2145   insulin aspart (novoLOG) injection 0-9 Units  0-9 Units Subcutaneous TID WC Cox, Amy N, DO   1 Units at 05/04/22 1254   insulin detemir (LEVEMIR) injection 60 Units  60 Units Subcutaneous Daily Nicole Kindred A, DO   60 Units at 05/04/22 0948   mirabegron ER (MYRBETRIQ) tablet 50 mg  50 mg Oral Daily Cox, Amy N, DO   50 mg at 05/04/22 0949   nystatin (MYCOSTATIN/NYSTOP) topical powder   Topical BID PRN Cox, Amy N, DO       nystatin (MYCOSTATIN/NYSTOP) topical powder   Topical TID Nicole Kindred A, DO   Given at 05/04/22 0950   pantoprazole (PROTONIX) EC tablet 40 mg  40 mg Oral Daily Cox, Amy N, DO   40 mg at 05/04/22 0948   senna-docusate (Senokot-S) tablet 1 tablet  1 tablet Oral QHS PRN Cox, Amy N, DO       vancomycin (VANCOCIN) capsule 125 mg  125 mg Oral QID Nicole Kindred A, DO   125 mg at 05/04/22 1300     Discharge Medications: Please see discharge summary for a list of discharge medications.  Relevant Imaging Results:  Relevant Lab Results:   Additional Information SSN: [id number]  Colen Darling, LCSWA

## 2022-05-05 DIAGNOSIS — G9341 Metabolic encephalopathy: Secondary | ICD-10-CM | POA: Diagnosis not present

## 2022-05-05 DIAGNOSIS — J9611 Chronic respiratory failure with hypoxia: Secondary | ICD-10-CM | POA: Diagnosis not present

## 2022-05-05 DIAGNOSIS — A0472 Enterocolitis due to Clostridium difficile, not specified as recurrent: Secondary | ICD-10-CM | POA: Diagnosis not present

## 2022-05-05 DIAGNOSIS — R531 Weakness: Secondary | ICD-10-CM | POA: Diagnosis not present

## 2022-05-05 LAB — CBC
HCT: 39.8 % (ref 36.0–46.0)
Hemoglobin: 11.8 g/dL — ABNORMAL LOW (ref 12.0–15.0)
MCH: 25.6 pg — ABNORMAL LOW (ref 26.0–34.0)
MCHC: 29.6 g/dL — ABNORMAL LOW (ref 30.0–36.0)
MCV: 86.3 fL (ref 80.0–100.0)
Platelets: 199 10*3/uL (ref 150–400)
RBC: 4.61 MIL/uL (ref 3.87–5.11)
RDW: 16.4 % — ABNORMAL HIGH (ref 11.5–15.5)
WBC: 8.4 10*3/uL (ref 4.0–10.5)
nRBC: 0 % (ref 0.0–0.2)

## 2022-05-05 LAB — URINE CULTURE: Culture: 10000 — AB

## 2022-05-05 LAB — GLUCOSE, CAPILLARY
Glucose-Capillary: 43 mg/dL — CL (ref 70–99)
Glucose-Capillary: 158 mg/dL — ABNORMAL HIGH (ref 70–99)
Glucose-Capillary: 177 mg/dL — ABNORMAL HIGH (ref 70–99)
Glucose-Capillary: 166 mg/dL — ABNORMAL HIGH (ref 70–99)
Glucose-Capillary: 194 mg/dL — ABNORMAL HIGH (ref 70–99)

## 2022-05-05 MED ORDER — AMOXICILLIN 500 MG PO CAPS
500.0000 mg | ORAL_CAPSULE | Freq: Three times a day (TID) | ORAL | Status: AC
  Administered 2022-05-05 – 2022-05-08 (×9): 500 mg via ORAL
  Filled 2022-05-05 (×9): qty 1

## 2022-05-05 MED ORDER — AMOXICILLIN 500 MG PO CAPS: 500.0000 mg | ORAL_CAPSULE | Freq: Three times a day (TID) | ORAL | Status: DC

## 2022-05-05 MED ORDER — INSULIN DETEMIR 100 UNIT/ML ~~LOC~~ SOLN
35.0000 [IU] | Freq: Every day | SUBCUTANEOUS | Status: DC
  Administered 2022-05-05 – 2022-05-10 (×6): 35 [IU] via SUBCUTANEOUS
  Filled 2022-05-05 (×6): qty 0.35

## 2022-05-05 NOTE — Progress Notes (Signed)
Progress Note    [name]  FWY:[id number] DOB: May 25, 1951  DOA: 04/29/2022 PCP: [facility]      Brief Narrative:    Medical records reviewed and are as summarized below:    [name] is a 71 year old female with history of hyperlipidemia, insulin-dependent diabetes mellitus, GERD, morbid obesity, mostly bedbound, cannot sit on edge of bed, presented to the ED on 04/29/2022 for evaluation of altered mental status, dysuria and concerns about her leg wounds and redness.  She also reported a productive cough for several months which she attributes to reflux.    [place] lives in [place] since a rehab stay at [facility] this past summer.  She reports strength and mobility have recently declined again, and she's had several falls.  PT/OT evaluations ordered.   Evaluation revealed likely UTI, and there is concern for cellulitis related to her b/l leg wounds.  Started on empiric Rocephin pending urine culture results.          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 difficile diarrhea   Generalized weakness   Elevated troponin   Chronic systolic CHF (congestive heart failure) (HCC)   Diabetes mellitus without complication (HCC)   Paroxysmal atrial fibrillation (HCC)   Morbid obesity (HCC)   CAD (coronary artery disease)   OSA on CPAP   Stage 3b chronic kidney disease (CKD) (HCC)   GERD (gastroesophageal reflux disease)   Intertriginous candidiasis   Hypoglycemia   Chronic respiratory failure with hypoxia (HCC)    Body mass index is 51.73 kg/m.  (Morbid obesity)   Acute metabolic encephalopathy Improved.  She  presented with altered mental status, likely infection etiology    Generalized weakness Due to chronic deconditioning, morbid obesity, and acute illness exacerbating baseline weakness.  Pt  resides at ALF. PT and OT recommended discharge to SNF  C. difficile diarrhea Pt noted to have fecal incontinence with loose / water stools.  C diff tested, showed positive antigen, but negative toxin.  Given symptomatic, will proceed with treatment. Continue oral vancomycin through 05/11/2022. She was started on oral vancomycin on 05/01/2022   UTI (urinary tract infection) Urine culture collected on 05/02/2022 showed Enterococcus faecalis. She completed 3 days of IV ceftriaxone on 05/01/2022. Start 3 day course of amoxicillin   Hypoxia, likely chronic hypoxic respiratory failure Oxygen saturation went down to 84% on room on 05/04/2022.  She was asymptomatic from this.  Continue 2 L/min oxygen via [place].  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  Due to demand ischemia in setting of acute illness. No chest pain or acute ischemic EKG changes. Started on empiric heparin on admission but this has been discontinued.    Chronic systolic CHF (congestive heart failure) ([place]) Compensated Echo from 08/27/21 -- EF 40-45%, indeterminate diastolic parameters, mild MR Continue Lasix     Paroxysmal atrial fibrillation (HCC) Heart rate is controlled.  Continue Eliquis   Diabetes mellitus without complication ([place]), recent hypoglycemia Decrease Levemir from 60 units to 35 units daily.  NovoLog as needed for hyperglycemia.    Intertriginous candidiasis Continue nystatin powder   Other comorbidities include OSA on CPAP, CAD, has metastases)/GERD, CKD stage IIIb  Diet Order             Diet heart healthy/carb modified Room service appropriate? Yes; Fluid consistency: Thin  Diet effective now                            Consultants: None  Procedures: None    Medications:    amoxicillin  500 mg Oral Q8H    apixaban  5 mg Oral BID   atorvastatin  40 mg Oral q1800   feeding supplement (PRO-STAT SUGAR FREE 64)  30 mL Oral BID   fenofibrate  160 mg Oral Daily   furosemide  20 mg Oral Daily   insulin aspart  0-5 Units Subcutaneous QHS   insulin aspart  0-9 Units Subcutaneous TID WC   insulin detemir  35 Units Subcutaneous Daily   mirabegron ER  50 mg Oral Daily   nystatin   Topical TID   pantoprazole  40 mg Oral Daily   vancomycin  125 mg Oral QID   Continuous Infusions:   Anti-infectives (From admission, onward)    Start     Dose/Rate Route Frequency Ordered Stop   05/05/22 1615  amoxicillin (AMOXIL) capsule 500 mg  Status:  Discontinued        500 mg Oral Every 8 hours 05/05/22 1529 05/05/22 1529   05/05/22 1615  amoxicillin (AMOXIL) capsule 500 mg        500 mg Oral Every 8 hours 05/05/22 1529 05/08/22 1359   05/01/22 1400  vancomycin (VANCOCIN) capsule 125 mg        125 mg Oral 4 times daily 05/01/22 1230 05/11/22 1359   05/01/22 1000  vancomycin (VANCOCIN) capsule 125 mg  Status:  Discontinued        125 mg Oral 4 times daily 05/01/22 0847 05/01/22 0847   04/30/22 1400  cefTRIAXone (ROCEPHIN) 2 g in sodium chloride 0.9 % 100 mL IVPB  Status:  Discontinued        2 g 200 mL/hr over 30 Minutes Intravenous Every 24 hours 04/29/22 1902 05/02/22 0741   04/29/22 1345  cefTRIAXone (ROCEPHIN) 1 g in sodium chloride 0.9 % 100 mL IVPB        1 g 200 mL/hr over 30 Minutes Intravenous  Once 04/29/22 1336 04/29/22 1539              Family Communication/Anticipated D/C date and plan/Code Status   DVT prophylaxis: Place TED hose Start: 04/29/22 1351 apixaban (ELIQUIS) tablet 5 mg     Code Status: Full Code  Family Communication: Plan discussed with [name]. cousin Disposition Plan: Plan to discharge to SNF   Status is: Inpatient Remains inpatient appropriate because: Awaiting placement to SNF       Subjective:   Interval events noted.  Blood glucose dropped to  43 this morning.  She complains of feeling "hot".  No chest pain or shortness of breath.  No fever or chills.  Objective:    Vitals:   05/04/22 1647 05/04/22 2002 05/05/22 0557 05/05/22 0805  BP: (!) 143/62 138/68 (!) 124/57 (!) 140/51  Pulse: (!) 59 62 (!) 58 65  Resp: 18 20 19 15   Temp: 98 F (36.7 C) 98.1 F (36.7 C) 97.7 F (36.5 C) 97.9 F (36.6 C)  TempSrc:   Oral   SpO2: 93% 94% 95% 96%  Weight:      Height:       No data found.  Intake/Output Summary (Last 24 hours) at 05/05/2022 1529 Last data filed at 05/04/2022 2317 Gross per 24 hour  Intake --  Output 1400 ml  Net -1400 ml   Filed Weights   04/29/22 1131  Weight: (!) 141 kg    Exam:  GEN: NAD SKIN: Warm and dry EYES: No pallor or icterus ENT: MMM CV: RRR PULM: CTA B ABD: soft, obese, NT, +BS CNS: AAO x 3, non focal EXT: No edema or tenderness        Data Reviewed:   I have personally reviewed following labs and imaging studies:  Labs: Labs show the following:   Basic Metabolic Panel: Recent Labs  Lab 04/29/22 1134 04/29/22 1401 04/30/22 0647 05/01/22 0354 05/02/22 0445  NA 145  --  144 141 142  K 4.2  --  4.0 3.9 3.9  CL 106  --  108 107 107  CO2 29  --  26 28 28   GLUCOSE 130*  --  161* 110* 65*  BUN 42*  --  40* 43* 41*  CREATININE 1.75*  --  1.78* 1.87* 1.68*  CALCIUM 8.9  --  8.6* 8.4* 8.5*  MG  --  2.2  --   --  2.1  PHOS  --  3.5  --   --   --    GFR Estimated Creatinine Clearance: 43.9 mL/min (A) (by C-G formula based on SCr of 1.68 mg/dL (H)). Liver Function Tests: Recent Labs  Lab 04/29/22 1134  AST 46*  ALT 25  ALKPHOS 59  BILITOT 1.4*  PROT 7.5  ALBUMIN 3.3*   No results for input(s): "LIPASE", "AMYLASE" in the last 168 hours. No results for input(s): "AMMONIA" in the last 168 hours. Coagulation profile Recent Labs  Lab 04/29/22 1548  INR 1.8*    CBC: Recent Labs  Lab 04/29/22 1134 04/30/22 0647 05/01/22 0354 05/02/22 0445 05/05/22 0333   WBC 7.7 6.6 7.3 7.7 8.4  NEUTROABS 6.5  --   --   --   --   HGB 11.6* 10.6* 11.3* 11.1* 11.8*  HCT 39.1 35.4* 38.0 36.8 39.8  MCV 86.3 85.3 85.0 84.0 86.3  PLT 226 214 267 199 199   Cardiac Enzymes: No results for input(s): "CKTOTAL", "CKMB", "CKMBINDEX", "TROPONINI" in the last 168 hours. BNP (last 3 results) No results for input(s): "PROBNP" in the last 8760 hours. CBG: Recent Labs  Lab 05/04/22 1647 05/04/22 1946 05/05/22 0808 05/05/22 0902 05/05/22 1157  GLUCAP 132* 132* 43* 158* 177*   D-Dimer: No results for input(s): "DDIMER" in the last 72 hours. Hgb A1c: No results for input(s): "HGBA1C" in the last 72 hours. Lipid Profile: No results for input(s): "CHOL", "HDL", "LDLCALC", "TRIG", "CHOLHDL", "LDLDIRECT" in the last 72 hours. Thyroid function studies: No results for input(s): "TSH", "T4TOTAL", "T3FREE", "THYROIDAB" in the last 72 hours.  Invalid input(s): "FREET3" Anemia work up: No results for input(s): "VITAMINB12", "FOLATE", "FERRITIN", "TIBC", "IRON", "RETICCTPCT" in the last 72 hours. Sepsis Labs: Recent Labs  Lab 04/29/22 1401 04/30/22 0647 05/01/22 0354 05/02/22 0445 05/05/22 0333  PROCALCITON <0.10  --   --   --   --   WBC  --  6.6 7.3 7.7 8.4  LATICACIDVEN 1.5  --  1.2  --   --     Microbiology Recent Results (from the past 240 hour(s))  Resp Panel by RT-PCR (Flu A&B, Covid) Anterior Nasal Swab     Status: None   Collection Time: 04/28/22  3:14 PM   Specimen:  Anterior Nasal Swab  Result Value Ref Range Status   SARS Coronavirus 2 by RT PCR NEGATIVE NEGATIVE Final    Comment: (NOTE) SARS-CoV-2 target nucleic acids are NOT DETECTED.  The SARS-CoV-2 RNA is generally detectable in upper respiratory specimens during the acute phase of infection. The lowest concentration of SARS-CoV-2 viral copies this assay can detect is 138 copies/mL. A negative result does not preclude SARS-Cov-2 infection and should not be used as the sole basis for  treatment or other patient management decisions. A negative result may occur with  improper specimen collection/handling, submission of specimen other than nasopharyngeal swab, presence of viral mutation(s) within the areas targeted by this assay, and inadequate number of viral copies(<138 copies/mL). A negative result must be combined with clinical observations, patient history, and epidemiological information. The expected result is Negative.  Fact Sheet for Patients:  EntrepreneurPulse.com.au  Fact Sheet for Healthcare Providers:  IncredibleEmployment.be  This test is no t yet approved or cleared by the [place] FDA and  has been authorized for detection and/or diagnosis of SARS-CoV-2 by FDA under an Emergency Use Authorization (EUA). This EUA will remain  in effect (meaning this test can be used) for the duration of the COVID-19 declaration under Section 564(b)(1) of the Act, 21 U.S.C.section 360bbb-3(b)(1), unless the authorization is terminated  or revoked sooner.       Influenza A by PCR NEGATIVE NEGATIVE Final   Influenza B by PCR NEGATIVE NEGATIVE Final    Comment: (NOTE) The Xpert Xpress SARS-CoV-2/FLU/RSV plus assay is intended as an aid in the diagnosis of influenza from Nasopharyngeal swab specimens and should not be used as a sole basis for treatment. Nasal washings and aspirates are unacceptable for Xpert Xpress SARS-CoV-2/FLU/RSV testing.  Fact Sheet for Patients: EntrepreneurPulse.com.au  Fact Sheet for Healthcare Providers: IncredibleEmployment.be  This test is not yet approved or cleared by the [place] FDA and has been authorized for detection and/or diagnosis of SARS-CoV-2 by FDA under an Emergency Use Authorization (EUA). This EUA will remain in effect (meaning this test can be used) for the duration of the COVID-19 declaration under Section 564(b)(1) of the Act, 21  U.S.C. section 360bbb-3(b)(1), unless the authorization is terminated or revoked.  Performed at [facility], [address] [place], [place] [address]   Resp Panel by RT-PCR (Flu A&B, Covid) Anterior Nasal Swab     Status: None   Collection Time: 04/29/22  2:01 PM   Specimen: Anterior Nasal Swab  Result Value Ref Range Status   SARS Coronavirus 2 by RT PCR NEGATIVE NEGATIVE Final    Comment: (NOTE) SARS-CoV-2 target nucleic acids are NOT DETECTED.  The SARS-CoV-2 RNA is generally detectable in upper respiratory specimens during the acute phase of infection. The lowest concentration of SARS-CoV-2 viral copies this assay can detect is 138 copies/mL. A negative result does not preclude SARS-Cov-2 infection and should not be used as the sole basis for treatment or other patient management decisions. A negative result may occur with  improper specimen collection/handling, submission of specimen other than nasopharyngeal swab, presence of viral mutation(s) within the areas targeted by this assay, and inadequate number of viral copies(<138 copies/mL). A negative result must be combined with clinical observations, patient history, and epidemiological information. The expected result is Negative.  Fact Sheet for Patients:  EntrepreneurPulse.com.au  Fact Sheet for Healthcare Providers:  IncredibleEmployment.be  This test is no t yet approved or cleared by the [place] FDA and  has been authorized for detection  and/or diagnosis of SARS-CoV-2 by FDA under an Emergency Use Authorization (EUA). This EUA will remain  in effect (meaning this test can be used) for the duration of the COVID-19 declaration under Section 564(b)(1) of the Act, 21 U.S.C.section 360bbb-3(b)(1), unless the authorization is terminated  or revoked sooner.       Influenza A by PCR NEGATIVE NEGATIVE Final   Influenza B by PCR NEGATIVE NEGATIVE Final    Comment:  (NOTE) The Xpert Xpress SARS-CoV-2/FLU/RSV plus assay is intended as an aid in the diagnosis of influenza from Nasopharyngeal swab specimens and should not be used as a sole basis for treatment. Nasal washings and aspirates are unacceptable for Xpert Xpress SARS-CoV-2/FLU/RSV testing.  Fact Sheet for Patients: EntrepreneurPulse.com.au  Fact Sheet for Healthcare Providers: IncredibleEmployment.be  This test is not yet approved or cleared by the [place] FDA and has been authorized for detection and/or diagnosis of SARS-CoV-2 by FDA under an Emergency Use Authorization (EUA). This EUA will remain in effect (meaning this test can be used) for the duration of the COVID-19 declaration under Section 564(b)(1) of the Act, 21 U.S.C. section 360bbb-3(b)(1), unless the authorization is terminated or revoked.  Performed at [facility], [address] [place], [place] [address]   C Difficile Quick Screen w PCR reflex     Status: Abnormal   Collection Time: 04/30/22 12:07 PM   Specimen: STOOL  Result Value Ref Range Status   C Diff antigen POSITIVE (A) NEGATIVE Final   C Diff toxin NEGATIVE NEGATIVE Final   C Diff interpretation Results are indeterminate. See PCR results.  Final    Comment: Performed at [facility], [address] [place], [place] [address]  C. Diff by PCR, Reflexed     Status: Abnormal   Collection Time: 04/30/22 12:07 PM  Result Value Ref Range Status   Toxigenic C. Difficile by PCR POSITIVE (A) NEGATIVE Final    Comment: Positive for toxigenic C. difficile with little to no toxin production. Only treat if clinical presentation suggests symptomatic illness. Performed at [facility], [address] [place], [place] [address]   Culture, blood (Routine X 2) w Reflex to ID Panel     Status: None (Preliminary result)   Collection Time: 05/01/22  3:54 AM   Specimen: BLOOD LEFT HAND  Result Value Ref  Range Status   Specimen Description BLOOD LEFT HAND  Final   Special Requests   Final    BOTTLES DRAWN AEROBIC AND ANAEROBIC Blood Culture adequate volume   Culture   Final    NO GROWTH 4 DAYS Performed at [facility], [address] [place], [place] [address]    Report Status PENDING  Incomplete  Culture, blood (Routine X 2) w Reflex to ID Panel     Status: None (Preliminary result)   Collection Time: 05/01/22  4:01 AM   Specimen: BLOOD LEFT HAND  Result Value Ref Range Status   Specimen Description BLOOD LEFT HAND  Final   Special Requests   Final    BOTTLES DRAWN AEROBIC AND ANAEROBIC Blood Culture adequate volume   Culture   Final    NO GROWTH 4 DAYS Performed at [facility], [address] [place],  [address]    Report Status PENDING  Incomplete  Urine Culture     Status: Abnormal   Collection Time: 05/02/22 11:30 PM   Specimen: Urine, Clean Catch  Result Value Ref Range Status   Specimen Description   Final  URINE, CLEAN CATCH Performed at [facility], [address] [place], [place] [address]    Special Requests   Final    NONE Performed at [facility], [address] [place], [place] [address]    Culture 10,000 [place] (A)  Final   Report Status 05/05/2022 FINAL  Final   Organism ID, Bacteria ENTEROCOCCUS FAECALIS (A)  Final      Susceptibility   Enterococcus faecalis - MIC*    AMPICILLIN <=2 SENSITIVE Sensitive     NITROFURANTOIN <=16 SENSITIVE Sensitive     VANCOMYCIN 2 SENSITIVE Sensitive     * 10,000 COLONIES/mL ENTEROCOCCUS FAECALIS    Procedures and diagnostic studies:  No results found.             LOS: 6 days   Lavelle Akel  [facility]   Pager on [email or web address]. If 7PM-7AM, please contact night-coverage at [email or web address]     05/05/2022, 3:29 PM

## 2022-05-05 NOTE — Progress Notes (Signed)
Patient with morning blood sugar 43, MD notified and patient given orange juice and crackers.  Breakfast tray delivered while patient was drinking orange juice.  Blood sugar rechecked after breakfast and was 158.  Patient was asymptomatic throughout.  Will continue to monitor.

## 2022-05-05 NOTE — Progress Notes (Signed)
Occupational Therapy Treatment Patient Details Name: [name] MRN: [id number] DOB: 1950/07/10 Today's Date: 05/05/2022   History of present illness 72 year old female with history of hyperlipidemia, insulin-dependent diabetes mellitus, GERD, morbid obesity, mostly bedbound, presented to the ED on 04/29/2022 for evaluation of altered mental status, dysuria and concerns about her leg wounds and redness.  She also reported a productive cough for several months which she attributes to reflux.  Resident at ALF ([facility]) since a rehab stay at [facility] this past summer.  She reports strength and mobility have steadily declined over the last year and she's had several falls.  Now with UTI.   OT comments  Upon entering the room, pt supine in bed with hospital gown pulled off and sheet partially donned. Pt appears very anxious and states she feels very hot. OT provided cloth cloth for comfort and adjusted room temperature per pt wishes. Set up A to don hospital gown. Pt is very adamant about locating TV remote and unable to be redirected. OT unable to locate. Pt rolls L <> R in bed with heavy use of bed rails and max A in both directs. No BM noted and remote still not located. Pt refusing all EOB attempts this session. Pt is repositioned in bed with all needs within reach. RN notified of pt's behavior and concerns.   Recommendations for follow up therapy are one component of a multi-disciplinary discharge planning process, led by the attending physician.  Recommendations may be updated based on patient status, additional functional criteria and insurance authorization.    Follow Up Recommendations  Skilled nursing-short term rehab (<3 hours/day)    Assistance Recommended at Discharge Frequent or constant Supervision/Assistance  Patient can return home with the following  Two people to help with walking and/or transfers;A lot of help with bathing/dressing/bathroom;Assistance with cooking/housework;Assist  for transportation;Help with stairs or ramp for entrance   Equipment Recommendations  Other (comment) (defer to next venue of care)       Precautions / Restrictions Precautions Precautions: Fall Restrictions Weight Bearing Restrictions: No       Mobility Bed Mobility Overal bed mobility: Needs Assistance Bed Mobility: Rolling Rolling: Max assist         General bed mobility comments: heavy max A to roll L <> R with use of bed rails    Transfers                   General transfer comment: Pt refused attempts     Balance       Sitting balance - Comments: pt refused       Standing balance comment: pt refused                           ADL either performed or assessed with clinical judgement   ADL                                         General ADL Comments: set up A to don hospital gown from bed level    Extremity/Trunk Assessment Upper Extremity Assessment Upper Extremity Assessment: Generalized weakness   Lower Extremity Assessment Lower Extremity Assessment: Generalized weakness        Vision Baseline Vision/History: 1 Wears glasses Patient Visual Report: No change from baseline            Cognition Arousal/Alertness: Awake/alert  Behavior During Therapy: Anxious                                   General Comments: Pt is very anxious and only wanting to locate remote in room as well as removed all clothing because she is hot.                   Pertinent Vitals/ Pain       Pain Assessment Pain Assessment: Faces Faces Pain Scale: Hurts little more Pain Location: BLE to touch Pain Descriptors / Indicators: Grimacing, Guarding, Discomfort Pain Intervention(s): Repositioned, Monitored during session         Frequency  Min 2X/week        Progress Toward Goals  OT Goals(current goals can now be found in the care plan section)  Progress towards OT goals: Progressing toward  goals  Acute Rehab OT Goals Patient Stated Goal: to regain strength OT Goal Formulation: With patient Time For Goal Achievement: 05/15/22 Potential to Achieve Goals: Good  Plan Discharge plan remains appropriate;Frequency remains appropriate       AM-PAC OT "6 Clicks" Daily Activity     Outcome Measure   Help from another person eating meals?: A Little Help from another person taking care of personal grooming?: A Little Help from another person toileting, which includes using toliet, bedpan, or urinal?: Total Help from another person bathing (including washing, rinsing, drying)?: A Lot Help from another person to put on and taking off regular upper body clothing?: A Little Help from another person to put on and taking off regular lower body clothing?: A Lot 6 Click Score: 14    End of Session    OT Visit Diagnosis: Muscle weakness (generalized) (M62.81);Repeated falls (R29.6)   Activity Tolerance Patient limited by fatigue   Patient Left in bed;with call bell/phone within reach;with bed alarm set   Nurse Communication Mobility status        Time: 6967-8938 OT Time Calculation (min): 21 min  Charges: OT General Charges $OT Visit: 1 Visit OT Treatments $Therapeutic Activity: 8-22 mins  Darleen Crocker, MS, OTR/L , CBIS ascom [phone]  05/05/22, 2:39 PM

## 2022-05-06 DIAGNOSIS — G9341 Metabolic encephalopathy: Secondary | ICD-10-CM | POA: Diagnosis not present

## 2022-05-06 DIAGNOSIS — I5022 Chronic systolic (congestive) heart failure: Secondary | ICD-10-CM | POA: Diagnosis not present

## 2022-05-06 DIAGNOSIS — A0472 Enterocolitis due to Clostridium difficile, not specified as recurrent: Secondary | ICD-10-CM | POA: Diagnosis not present

## 2022-05-06 LAB — CULTURE, BLOOD (ROUTINE X 2)
Culture: NO GROWTH
Culture: NO GROWTH
Special Requests: ADEQUATE
Special Requests: ADEQUATE

## 2022-05-06 LAB — GLUCOSE, CAPILLARY
Glucose-Capillary: 114 mg/dL — ABNORMAL HIGH (ref 70–99)
Glucose-Capillary: 150 mg/dL — ABNORMAL HIGH (ref 70–99)
Glucose-Capillary: 162 mg/dL — ABNORMAL HIGH (ref 70–99)
Glucose-Capillary: 217 mg/dL — ABNORMAL HIGH (ref 70–99)

## 2022-05-06 LAB — BASIC METABOLIC PANEL
Anion gap: 6 (ref 5–15)
BUN: 39 mg/dL — ABNORMAL HIGH (ref 8–23)
CO2: 30 mmol/L (ref 22–32)
Calcium: 8.4 mg/dL — ABNORMAL LOW (ref 8.9–10.3)
Chloride: 103 mmol/L (ref 98–111)
Creatinine, Ser: 1.65 mg/dL — ABNORMAL HIGH (ref 0.44–1.00)
GFR, Estimated: 33 mL/min — ABNORMAL LOW (ref >60)
Glucose, Bld: 165 mg/dL — ABNORMAL HIGH (ref 70–99)
Potassium: 4.5 mmol/L (ref 3.5–5.1)
Sodium: 139 mmol/L (ref 135–145)

## 2022-05-06 LAB — PHOSPHORUS: Phosphorus: 3.4 mg/dL (ref 2.5–4.6)

## 2022-05-06 LAB — MAGNESIUM: Magnesium: 2.1 mg/dL (ref 1.7–2.4)

## 2022-05-06 NOTE — Progress Notes (Signed)
Progress Note    [name]  XKG:[id number] DOB: 1950/11/06  DOA: 04/29/2022 PCP: [facility]      Brief Narrative:    Medical records reviewed and are as summarized below:    [name] is a 71 year old female with history of hyperlipidemia, insulin-dependent diabetes mellitus, GERD, morbid obesity, mostly bedbound, cannot sit on edge of bed, presented to the ED on 04/29/2022 for evaluation of altered mental status, dysuria and concerns about her leg wounds and redness.  She also reported a productive cough for several months which she attributes to reflux.    Pt lives in [place] ([facility] since a rehab stay at [facility] this past summer.  She reports strength and mobility have recently declined again, and she's had several falls.  PT/OT evaluations ordered.   Evaluation revealed likely UTI, and there is concern for cellulitis related to her b/l leg wounds.  Started on empiric Rocephin pending urine culture results.          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 difficile diarrhea   Generalized weakness   Elevated troponin   Chronic systolic CHF (congestive heart failure) (HCC)   Diabetes mellitus without complication (HCC)   Paroxysmal atrial fibrillation (HCC)   Morbid obesity (HCC)   CAD (coronary artery disease)   OSA on CPAP   Stage 3b chronic kidney disease (CKD) (HCC)   GERD (gastroesophageal reflux disease)   Intertriginous candidiasis   Hypoglycemia   Chronic respiratory failure with hypoxia (HCC)    Body mass index is 51.73 kg/m.  (Morbid obesity)   Acute metabolic encephalopathy Improved.  She  presented with altered mental status, likely infection etiology    Generalized weakness She came from ALF.  PT recommended discharge to SNF  C. difficile diarrhea Pt noted to have fecal  incontinence with loose / water stools.  C diff tested, showed positive antigen, but negative toxin.  Given symptomatic, will proceed with treatment. She still has diarrhea.  Continue vancomycin  until 05/11/2022.  She was started on oral vancomycin on 05/01/2022 Check BMP/electrolytes because of diarrhea  UTI (urinary tract infection) Urine culture collected on 05/02/2022 showed Enterococcus faecalis. She completed 3 days of IV ceftriaxone on 05/01/2022. Continue amoxicillin through 05/07/2022  Hypoxia, likely chronic hypoxic respiratory failure Oxygen saturation went down to 84% on room on 05/04/2022.  She was asymptomatic from this.  Continue 2 L/min oxygen via [place].  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  Due to demand ischemia in setting of acute illness. No chest pain or acute ischemic EKG changes. Started on empiric heparin on admission but this has been discontinued.    Chronic systolic CHF (congestive heart failure) ([place]) Compensated Echo from 08/27/21 -- EF 40-45%, indeterminate diastolic parameters, mild MR Continue Lasix     Paroxysmal atrial fibrillation (HCC) Heart rate is controlled.  Continue Eliquis   Diabetes mellitus without complication ([place]), recent hypoglycemia Decrease Levemir from 60 units to 35 units daily.  NovoLog as needed for hyperglycemia.    Intertriginous candidiasis Continue nystatin powder   Other comorbidities include OSA on CPAP, CAD, has metastases)/GERD, CKD stage IIIb  Diet Order             Diet heart healthy/carb modified Room service appropriate? Yes; Fluid consistency: Thin  Diet effective now                            Consultants: None  Procedures: None    Medications:    amoxicillin  500 mg  Oral Q8H   apixaban  5 mg Oral BID   atorvastatin  40 mg Oral q1800   feeding supplement (PRO-STAT SUGAR FREE 64)  30 mL Oral BID   fenofibrate  160 mg Oral Daily   furosemide  20 mg Oral Daily   insulin aspart  0-5 Units Subcutaneous QHS   insulin aspart  0-9 Units Subcutaneous TID WC   insulin detemir  35 Units Subcutaneous Daily   mirabegron ER  50 mg Oral Daily   nystatin   Topical TID   pantoprazole  40 mg Oral Daily   vancomycin  125 mg Oral QID   Continuous Infusions:   Anti-infectives (From admission, onward)    Start     Dose/Rate Route Frequency Ordered Stop   05/05/22 1615  amoxicillin (AMOXIL) capsule 500 mg  Status:  Discontinued        500 mg Oral Every 8 hours 05/05/22 1529 05/05/22 1529   05/05/22 1615  amoxicillin (AMOXIL) capsule 500 mg        500 mg Oral Every 8 hours 05/05/22 1529 05/08/22 1359   05/01/22 1400  vancomycin (VANCOCIN) capsule 125 mg        125 mg Oral 4 times daily 05/01/22 1230 05/11/22 1359   05/01/22 1000  vancomycin (VANCOCIN) capsule 125 mg  Status:  Discontinued        125 mg Oral 4 times daily 05/01/22 0847 05/01/22 0847   04/30/22 1400  cefTRIAXone (ROCEPHIN) 2 g in sodium chloride 0.9 % 100 mL IVPB  Status:  Discontinued        2 g 200 mL/hr over 30 Minutes Intravenous Every 24 hours 04/29/22 1902 05/02/22 0741   04/29/22 1345  cefTRIAXone (ROCEPHIN) 1 g in sodium chloride 0.9 % 100 mL IVPB        1 g 200 mL/hr over 30 Minutes Intravenous  Once 04/29/22 1336 04/29/22 1539              Family Communication/Anticipated D/C date and plan/Code Status   DVT prophylaxis: Place TED hose Start: 04/29/22 1351 apixaban (ELIQUIS) tablet 5 mg     Code Status: Full Code  Family Communication: None Disposition Plan: Plan to discharge to SNF   Status is: Inpatient Remains inpatient appropriate because: Awaiting placement to SNF       Subjective:   Interval events noted. She had multiple brown stools overnight.  No other  complaints.  No chest pain or shortness of breath.  Objective:    Vitals:   05/05/22 1644 05/05/22 1943 05/06/22 0454 05/06/22 0743  BP: (!) 139/58 (!) 142/73 (!) 138/97 (!) 142/64  Pulse: 65 (!) 58 (!) 56 60  Resp: 15 16 20 20   Temp: 98 F (36.7 C) (!) 97.4 F (36.3 C) 98 F (36.7 C) 98 F (36.7 C)  TempSrc:   Oral   SpO2: 92% 91% 91% 94%  Weight:      Height:       No data found.   Intake/Output Summary (Last 24 hours) at 05/06/2022 1108 Last data filed at  05/06/2022 1030 Gross per 24 hour  Intake --  Output 1700 ml  Net -1700 ml   Filed Weights   04/29/22 1131  Weight: (!) 141 kg    Exam:  GEN: NAD SKIN: Warm and dry EYES: No pallor or icterus ENT: MMM CV: RRR PULM: CTA B ABD: soft, obese, NT, +BS CNS: AAO x 3, non focal EXT: No edema or tenderness       Data Reviewed:   I have personally reviewed following labs and imaging studies:  Labs: Labs show the following:   Basic Metabolic Panel: Recent Labs  Lab 04/29/22 1401 04/30/22 0647 04/30/22 0647 05/01/22 0354 05/02/22 0445  NA  --  144  --  141 142  K  --  4.0   < > 3.9 3.9  CL  --  108  --  107 107  CO2  --  26  --  28 28  GLUCOSE  --  161*  --  110* 65*  BUN  --  40*  --  43* 41*  CREATININE  --  1.78*  --  1.87* 1.68*  CALCIUM  --  8.6*  --  8.4* 8.5*  MG 2.2  --   --   --  2.1  PHOS 3.5  --   --   --   --    < > = values in this interval not displayed.   GFR Estimated Creatinine Clearance: 43.9 mL/min (A) (by C-G formula based on SCr of 1.68 mg/dL (H)). Liver Function Tests: No results for input(s): "AST", "ALT", "ALKPHOS", "BILITOT", "PROT", "ALBUMIN" in the last 168 hours.  No results for input(s): "LIPASE", "AMYLASE" in the last 168 hours. No results for input(s): "AMMONIA" in the last 168 hours. Coagulation profile Recent Labs  Lab 04/29/22 1548  INR 1.8*    CBC: Recent Labs  Lab 04/30/22 0647 05/01/22 0354 05/02/22 0445 05/05/22 0333  WBC 6.6 7.3 7.7 8.4   HGB 10.6* 11.3* 11.1* 11.8*  HCT 35.4* 38.0 36.8 39.8  MCV 85.3 85.0 84.0 86.3  PLT 214 267 199 199   Cardiac Enzymes: No results for input(s): "CKTOTAL", "CKMB", "CKMBINDEX", "TROPONINI" in the last 168 hours. BNP (last 3 results) No results for input(s): "PROBNP" in the last 8760 hours. CBG: Recent Labs  Lab 05/05/22 0902 05/05/22 1157 05/05/22 1640 05/05/22 1930 05/06/22 0758  GLUCAP 158* 177* 166* 194* 114*   D-Dimer: No results for input(s): "DDIMER" in the last 72 hours. Hgb A1c: No results for input(s): "HGBA1C" in the last 72 hours. Lipid Profile: No results for input(s): "CHOL", "HDL", "LDLCALC", "TRIG", "CHOLHDL", "LDLDIRECT" in the last 72 hours. Thyroid function studies: No results for input(s): "TSH", "T4TOTAL", "T3FREE", "THYROIDAB" in the last 72 hours.  Invalid input(s): "FREET3" Anemia work up: No results for input(s): "VITAMINB12", "FOLATE", "FERRITIN", "TIBC", "IRON", "RETICCTPCT" in the last 72 hours. Sepsis Labs: Recent Labs  Lab 04/29/22 1401 04/30/22 0647 05/01/22 0354 05/02/22 0445 05/05/22 0333  PROCALCITON <0.10  --   --   --   --   WBC  --  6.6 7.3 7.7 8.4  LATICACIDVEN 1.5  --  1.2  --   --     Microbiology Recent Results (from the past 240 hour(s))  Resp Panel by RT-PCR (Flu A&B, Covid) Anterior Nasal Swab     Status: None   Collection Time: 04/28/22  3:14 PM   Specimen: Anterior Nasal Swab  Result Value Ref Range Status   SARS Coronavirus 2 by RT PCR  NEGATIVE NEGATIVE Final    Comment: (NOTE) SARS-CoV-2 target nucleic acids are NOT DETECTED.  The SARS-CoV-2 RNA is generally detectable in upper respiratory specimens during the acute phase of infection. The lowest concentration of SARS-CoV-2 viral copies this assay can detect is 138 copies/mL. A negative result does not preclude SARS-Cov-2 infection and should not be used as the sole basis for treatment or other patient management decisions. A negative result may occur with   improper specimen collection/handling, submission of specimen other than nasopharyngeal swab, presence of viral mutation(s) within the areas targeted by this assay, and inadequate number of viral copies(<138 copies/mL). A negative result must be combined with clinical observations, patient history, and epidemiological information. The expected result is Negative.  Fact Sheet for Patients:  EntrepreneurPulse.com.au  Fact Sheet for Healthcare Providers:  IncredibleEmployment.be  This test is no t yet approved or cleared by the [place] FDA and  has been authorized for detection and/or diagnosis of SARS-CoV-2 by FDA under an Emergency Use Authorization (EUA). This EUA will remain  in effect (meaning this test can be used) for the duration of the COVID-19 declaration under Section 564(b)(1) of the Act, 21 U.S.C.section 360bbb-3(b)(1), unless the authorization is terminated  or revoked sooner.       Influenza A by PCR NEGATIVE NEGATIVE Final   Influenza B by PCR NEGATIVE NEGATIVE Final    Comment: (NOTE) The Xpert Xpress SARS-CoV-2/FLU/RSV plus assay is intended as an aid in the diagnosis of influenza from Nasopharyngeal swab specimens and should not be used as a sole basis for treatment. Nasal washings and aspirates are unacceptable for Xpert Xpress SARS-CoV-2/FLU/RSV testing.  Fact Sheet for Patients: EntrepreneurPulse.com.au  Fact Sheet for Healthcare Providers: IncredibleEmployment.be  This test is not yet approved or cleared by the [place] FDA and has been authorized for detection and/or diagnosis of SARS-CoV-2 by FDA under an Emergency Use Authorization (EUA). This EUA will remain in effect (meaning this test can be used) for the duration of the COVID-19 declaration under Section 564(b)(1) of the Act, 21 U.S.C. section 360bbb-3(b)(1), unless the authorization is terminated  or revoked.  Performed at [facility], [address] [place], [place] [address]   Resp Panel by RT-PCR (Flu A&B, Covid) Anterior Nasal Swab     Status: None   Collection Time: 04/29/22  2:01 PM   Specimen: Anterior Nasal Swab  Result Value Ref Range Status   SARS Coronavirus 2 by RT PCR NEGATIVE NEGATIVE Final    Comment: (NOTE) SARS-CoV-2 target nucleic acids are NOT DETECTED.  The SARS-CoV-2 RNA is generally detectable in upper respiratory specimens during the acute phase of infection. The lowest concentration of SARS-CoV-2 viral copies this assay can detect is 138 copies/mL. A negative result does not preclude SARS-Cov-2 infection and should not be used as the sole basis for treatment or other patient management decisions. A negative result may occur with  improper specimen collection/handling, submission of specimen other than nasopharyngeal swab, presence of viral mutation(s) within the areas targeted by this assay, and inadequate number of viral copies(<138 copies/mL). A negative result must be combined with clinical observations, patient history, and epidemiological information. The expected result is Negative.  Fact Sheet for Patients:  EntrepreneurPulse.com.au  Fact Sheet for Healthcare Providers:  IncredibleEmployment.be  This test is no t yet approved or cleared by the [place] FDA and  has been authorized for detection and/or diagnosis of SARS-CoV-2 by FDA under an Emergency Use Authorization (EUA). This EUA will remain  in effect (meaning this test can be used) for the duration of the COVID-19 declaration under Section 564(b)(1) of the Act, 21 U.S.C.section 360bbb-3(b)(1), unless the authorization is terminated  or revoked sooner.       Influenza A by PCR NEGATIVE NEGATIVE Final   Influenza B by PCR NEGATIVE NEGATIVE Final    Comment: (NOTE) The Xpert Xpress SARS-CoV-2/FLU/RSV plus assay is intended as an  aid in the diagnosis of influenza from Nasopharyngeal swab specimens and should not be used as a sole basis for treatment. Nasal washings and aspirates are unacceptable for Xpert Xpress SARS-CoV-2/FLU/RSV testing.  Fact Sheet for Patients: EntrepreneurPulse.com.au  Fact Sheet for Healthcare Providers: IncredibleEmployment.be  This test is not yet approved or cleared by the [place] FDA and has been authorized for detection and/or diagnosis of SARS-CoV-2 by FDA under an Emergency Use Authorization (EUA). This EUA will remain in effect (meaning this test can be used) for the duration of the COVID-19 declaration under Section 564(b)(1) of the Act, 21 U.S.C. section 360bbb-3(b)(1), unless the authorization is terminated or revoked.  Performed at [facility], [address] [place], [place] [address]   C Difficile Quick Screen w PCR reflex     Status: Abnormal   Collection Time: 04/30/22 12:07 PM   Specimen: STOOL  Result Value Ref Range Status   C Diff antigen POSITIVE (A) NEGATIVE Final   C Diff toxin NEGATIVE NEGATIVE Final   C Diff interpretation Results are indeterminate. See PCR results.  Final    Comment: Performed at [facility], [address] [place], [place] [address]  C. Diff by PCR, Reflexed     Status: Abnormal   Collection Time: 04/30/22 12:07 PM  Result Value Ref Range Status   Toxigenic C. Difficile by PCR POSITIVE (A) NEGATIVE Final    Comment: Positive for toxigenic C. difficile with little to no toxin production. Only treat if clinical presentation suggests symptomatic illness. Performed at [facility], [address] [place], [place] [address]   Culture, blood (Routine X 2) w Reflex to ID Panel     Status: None   Collection Time: 05/01/22  3:54 AM   Specimen: BLOOD LEFT HAND  Result Value Ref Range Status   Specimen Description BLOOD LEFT HAND  Final   Special Requests   Final     BOTTLES DRAWN AEROBIC AND ANAEROBIC Blood Culture adequate volume   Culture   Final    NO GROWTH 5 DAYS Performed at [facility], [address], [place], [place] [address]    Report Status 05/06/2022 FINAL  Final  Culture, blood (Routine X 2) w Reflex to ID Panel     Status: None   Collection Time: 05/01/22  4:01 AM   Specimen: BLOOD LEFT HAND  Result Value Ref Range Status   Specimen Description BLOOD LEFT HAND  Final   Special Requests   Final    BOTTLES DRAWN AEROBIC AND ANAEROBIC Blood Culture adequate volume   Culture   Final    NO GROWTH 5 DAYS Performed at [facility], [address], [place], [place] [address]    Report Status 05/06/2022 FINAL  Final  Urine Culture     Status: Abnormal   Collection Time: 05/02/22 11:30 PM   Specimen: Urine, Clean Catch  Result Value Ref Range Status   Specimen Description   Final    URINE, CLEAN CATCH Performed at [facility], [address], [place], [place] [address]  Special Requests   Final    NONE Performed at [facility], [place], [place] [address]    Culture 10,000 COLONIES/mL ENTEROCOCCUS FAECALIS (A)  Final   Report Status 05/05/2022 FINAL  Final   Organism ID, Bacteria ENTEROCOCCUS FAECALIS (A)  Final      Susceptibility   Enterococcus faecalis - MIC*    AMPICILLIN <=2 SENSITIVE Sensitive     NITROFURANTOIN <=16 SENSITIVE Sensitive     VANCOMYCIN 2 SENSITIVE Sensitive     * 10,000 COLONIES/mL ENTEROCOCCUS FAECALIS    Procedures and diagnostic studies:  No results found.             LOS: 7 days   Romano Stigger  [facility]   Pager on [email or web address]. If 7PM-7AM, please contact night-coverage at [email or web address]     05/06/2022, 11:08 AM

## 2022-05-07 DIAGNOSIS — G9341 Metabolic encephalopathy: Secondary | ICD-10-CM | POA: Diagnosis not present

## 2022-05-07 DIAGNOSIS — A0472 Enterocolitis due to Clostridium difficile, not specified as recurrent: Secondary | ICD-10-CM | POA: Diagnosis not present

## 2022-05-07 DIAGNOSIS — I480 Paroxysmal atrial fibrillation: Secondary | ICD-10-CM | POA: Diagnosis not present

## 2022-05-07 LAB — GLUCOSE, CAPILLARY
Glucose-Capillary: 124 mg/dL — ABNORMAL HIGH (ref 70–99)
Glucose-Capillary: 123 mg/dL — ABNORMAL HIGH (ref 70–99)
Glucose-Capillary: 141 mg/dL — ABNORMAL HIGH (ref 70–99)
Glucose-Capillary: 158 mg/dL — ABNORMAL HIGH (ref 70–99)

## 2022-05-07 MED ORDER — FIDAXOMICIN 200 MG PO TABS
200.0000 mg | ORAL_TABLET | Freq: Two times a day (BID) | ORAL | Status: DC
  Administered 2022-05-07 – 2022-05-10 (×7): 200 mg via ORAL
  Filled 2022-05-07 (×7): qty 1

## 2022-05-07 NOTE — Progress Notes (Signed)
Progress Note    [name]  ZDG:[id number] DOB: 1950/10/21  DOA: 04/29/2022 PCP: [facility]      Brief Narrative:    Medical records reviewed and are as summarized below:    [name] is a 71 year old female with history of hyperlipidemia, insulin-dependent diabetes mellitus, GERD, morbid obesity, mostly bedbound, cannot sit on edge of bed, presented to the ED on 04/29/2022 for evaluation of altered mental status, dysuria and concerns about her leg wounds and redness.  She also reported a productive cough for several months which she attributes to reflux.    Pt lives in [place] since a rehab stay at [facility] this past summer.  She reports strength and mobility have recently declined again, and she's had several falls.  PT/OT evaluations ordered.   Evaluation revealed likely UTI, and there is concern for cellulitis related to her b/l leg wounds.  Started on empiric Rocephin pending urine culture results.          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 difficile diarrhea   Generalized weakness   Elevated troponin   Chronic systolic CHF (congestive heart failure) (HCC)   Diabetes mellitus without complication (HCC)   Paroxysmal atrial fibrillation (HCC)   Morbid obesity (HCC)   CAD (coronary artery disease)   OSA on CPAP   Stage 3b chronic kidney disease (CKD) (HCC)   GERD (gastroesophageal reflux disease)   Intertriginous candidiasis   Hypoglycemia   Chronic respiratory failure with hypoxia (HCC)    Body mass index is 51.73 kg/m.  (Morbid obesity)   Acute metabolic encephalopathy Improved.  She  presented with altered mental status, likely infection etiology    Generalized weakness She came from ALF.  PT recommended discharge to SNF  C. difficile diarrhea Pt noted to have fecal  incontinence with loose / water stools.  C diff tested, showed positive antigen, but negative toxin.  Given symptomatic, will proceed with treatment. She is still having diarrhea.  Discontinue oral vancomycin (started on 05/01/2022).  Start fidaxomicin.    UTI (urinary tract infection) Urine culture collected on 05/02/2022 showed Enterococcus faecalis. She completed 3 days of IV ceftriaxone on 05/01/2022. Continue amoxicillin through 05/07/2022  Hypoxia, likely chronic hypoxic respiratory failure Oxygen saturation went down to 84% on room on 05/04/2022.  She was asymptomatic from this.  Continue 2 L/min oxygen via [place].  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  Due to demand ischemia in setting of acute illness. No chest pain or acute ischemic EKG changes. Started on empiric heparin on admission but this has been discontinued.    Chronic systolic CHF (congestive heart failure) ([place]) Compensated Echo from 08/27/21 -- EF 40-45%, indeterminate diastolic parameters, mild MR Hold Lasix because of persistent diarrhea.   Paroxysmal atrial fibrillation (HCC) Heart rate is controlled.  Continue Eliquis   Diabetes mellitus without complication ([place]), recent hypoglycemia Continue Levemir 35 units daily (dose decreased from 60 units) NovoLog as needed for hyperglycemia.    Intertriginous candidiasis Continue nystatin powder   Other comorbidities include OSA on CPAP, CAD, has metastases)/GERD, CKD stage IIIb     Diet Order  Diet heart healthy/carb modified Room service appropriate? Yes; Fluid consistency: Thin  Diet effective now                            Consultants: None  Procedures: None    Medications:    amoxicillin  500 mg Oral Q8H    apixaban  5 mg Oral BID   atorvastatin  40 mg Oral q1800   feeding supplement (PRO-STAT SUGAR FREE 64)  30 mL Oral BID   fenofibrate  160 mg Oral Daily   fidaxomicin  200 mg Oral BID   insulin aspart  0-5 Units Subcutaneous QHS   insulin aspart  0-9 Units Subcutaneous TID WC   insulin detemir  35 Units Subcutaneous Daily   mirabegron ER  50 mg Oral Daily   nystatin   Topical TID   pantoprazole  40 mg Oral Daily   Continuous Infusions:   Anti-infectives (From admission, onward)    Start     Dose/Rate Route Frequency Ordered Stop   05/07/22 1000  fidaxomicin (DIFICID) tablet 200 mg        200 mg Oral 2 times daily 05/07/22 0912 05/17/22 0959   05/05/22 1615  amoxicillin (AMOXIL) capsule 500 mg  Status:  Discontinued        500 mg Oral Every 8 hours 05/05/22 1529 05/05/22 1529   05/05/22 1615  amoxicillin (AMOXIL) capsule 500 mg        500 mg Oral Every 8 hours 05/05/22 1529 05/08/22 1359   05/01/22 1400  vancomycin (VANCOCIN) capsule 125 mg  Status:  Discontinued        125 mg Oral 4 times daily 05/01/22 1230 05/07/22 0912   05/01/22 1000  vancomycin (VANCOCIN) capsule 125 mg  Status:  Discontinued        125 mg Oral 4 times daily 05/01/22 0847 05/01/22 0847   04/30/22 1400  cefTRIAXone (ROCEPHIN) 2 g in sodium chloride 0.9 % 100 mL IVPB  Status:  Discontinued        2 g 200 mL/hr over 30 Minutes Intravenous Every 24 hours 04/29/22 1902 05/02/22 0741   04/29/22 1345  cefTRIAXone (ROCEPHIN) 1 g in sodium chloride 0.9 % 100 mL IVPB        1 g 200 mL/hr over 30 Minutes Intravenous  Once 04/29/22 1336 04/29/22 1539              Family Communication/Anticipated D/C date and plan/Code Status   DVT prophylaxis: Place TED hose Start: 04/29/22 1351 apixaban (ELIQUIS) tablet 5 mg     Code Status: Full Code  Family Communication: None Disposition Plan: Plan to discharge to SNF   Status is: Inpatient Remains inpatient appropriate because: Awaiting placement to  SNF       Subjective:   Interval events noted.  She still has watery diarrhea.  No vomiting, shortness of breath or chest pain.  Robert Bellow, RN, was at the bedside  Objective:    Vitals:   05/06/22 1641 05/06/22 2021 05/07/22 0527 05/07/22 0821  BP: (!) 159/76 139/61 (!) 126/55 135/61  Pulse: (!) 59 (!) 59 (!) 59 (!) 59  Resp: 15 18 18 17   Temp: 97.7 F (36.5 C) 99.1 F (37.3 C) 98 F (36.7 C) 98.7 F (37.1 C)  TempSrc:  Oral Oral   SpO2: 95% 93% 92% 93%  Weight:      Height:       No data found.  Intake/Output Summary (Last 24 hours) at 05/07/2022 1226 Last data filed at 05/07/2022 0840 Gross per 24 hour  Intake 60 ml  Output 1150 ml  Net -1090 ml   Filed Weights   04/29/22 1131  Weight: (!) 141 kg    Exam:   GEN: NAD SKIN: Warm and dry EYES: No pallor or icterus ENT: MMM CV: RRR PULM: CTA B ABD: soft, obese, NT, +BS CNS: AAO x 3, non focal EXT: Chronic erythema and wounds on bilateral leg     Data Reviewed:   I have personally reviewed following labs and imaging studies:  Labs: Labs show the following:   Basic Metabolic Panel: Recent Labs  Lab 05/01/22 0354 05/02/22 0445 05/06/22 1133  NA 141 142 139  K 3.9 3.9 4.5  CL 107 107 103  CO2 28 28 30   GLUCOSE 110* 65* 165*  BUN 43* 41* 39*  CREATININE 1.87* 1.68* 1.65*  CALCIUM 8.4* 8.5* 8.4*  MG  --  2.1 2.1  PHOS  --   --  3.4   GFR Estimated Creatinine Clearance: 44.7 mL/min (A) (by C-G formula based on SCr of 1.65 mg/dL (H)). Liver Function Tests: No results for input(s): "AST", "ALT", "ALKPHOS", "BILITOT", "PROT", "ALBUMIN" in the last 168 hours.  No results for input(s): "LIPASE", "AMYLASE" in the last 168 hours. No results for input(s): "AMMONIA" in the last 168 hours. Coagulation profile No results for input(s): "INR", "PROTIME" in the last 168 hours.   CBC: Recent Labs  Lab 05/01/22 0354 05/02/22 0445 05/05/22 0333  WBC 7.3 7.7 8.4  HGB 11.3* 11.1* 11.8*  HCT  38.0 36.8 39.8  MCV 85.0 84.0 86.3  PLT 267 199 199   Cardiac Enzymes: No results for input(s): "CKTOTAL", "CKMB", "CKMBINDEX", "TROPONINI" in the last 168 hours. BNP (last 3 results) No results for input(s): "PROBNP" in the last 8760 hours. CBG: Recent Labs  Lab 05/06/22 1130 05/06/22 1623 05/06/22 2048 05/07/22 0823 05/07/22 1158  GLUCAP 150* 162* 217* 124* 123*   D-Dimer: No results for input(s): "DDIMER" in the last 72 hours. Hgb A1c: No results for input(s): "HGBA1C" in the last 72 hours. Lipid Profile: No results for input(s): "CHOL", "HDL", "LDLCALC", "TRIG", "CHOLHDL", "LDLDIRECT" in the last 72 hours. Thyroid function studies: No results for input(s): "TSH", "T4TOTAL", "T3FREE", "THYROIDAB" in the last 72 hours.  Invalid input(s): "FREET3" Anemia work up: No results for input(s): "VITAMINB12", "FOLATE", "FERRITIN", "TIBC", "IRON", "RETICCTPCT" in the last 72 hours. Sepsis Labs: Recent Labs  Lab 05/01/22 0354 05/02/22 0445 05/05/22 0333  WBC 7.3 7.7 8.4  LATICACIDVEN 1.2  --   --     Microbiology Recent Results (from the past 240 hour(s))  Resp Panel by RT-PCR (Flu A&B, Covid) Anterior Nasal Swab     Status: None   Collection Time: 04/28/22  3:14 PM   Specimen: Anterior Nasal Swab  Result Value Ref Range Status   SARS Coronavirus 2 by RT PCR NEGATIVE NEGATIVE Final    Comment: (NOTE) SARS-CoV-2 target nucleic acids are NOT DETECTED.  The SARS-CoV-2 RNA is generally detectable in upper respiratory specimens during the acute phase of infection. The lowest concentration of SARS-CoV-2 viral copies this assay can detect is 138 copies/mL. A negative result does not preclude SARS-Cov-2 infection and should not be used as the sole basis for treatment or other patient management decisions. A negative result may occur with  improper specimen collection/handling, submission of specimen other than nasopharyngeal swab, presence of viral mutation(s) within  the  areas targeted by this assay, and inadequate number of viral copies(<138 copies/mL). A negative result must be combined with clinical observations, patient history, and epidemiological information. The expected result is Negative.  Fact Sheet for Patients:  EntrepreneurPulse.com.au  Fact Sheet for Healthcare Providers:  IncredibleEmployment.be  This test is no t yet approved or cleared by the [place] FDA and  has been authorized for detection and/or diagnosis of SARS-CoV-2 by FDA under an Emergency Use Authorization (EUA). This EUA will remain  in effect (meaning this test can be used) for the duration of the COVID-19 declaration under Section 564(b)(1) of the Act, 21 U.S.C.section 360bbb-3(b)(1), unless the authorization is terminated  or revoked sooner.       Influenza A by PCR NEGATIVE NEGATIVE Final   Influenza B by PCR NEGATIVE NEGATIVE Final    Comment: (NOTE) The Xpert Xpress SARS-CoV-2/FLU/RSV plus assay is intended as an aid in the diagnosis of influenza from Nasopharyngeal swab specimens and should not be used as a sole basis for treatment. Nasal washings and aspirates are unacceptable for Xpert Xpress SARS-CoV-2/FLU/RSV testing.  Fact Sheet for Patients: EntrepreneurPulse.com.au  Fact Sheet for Healthcare Providers: IncredibleEmployment.be  This test is not yet approved or cleared by the [place] FDA and has been authorized for detection and/or diagnosis of SARS-CoV-2 by FDA under an Emergency Use Authorization (EUA). This EUA will remain in effect (meaning this test can be used) for the duration of the COVID-19 declaration under Section 564(b)(1) of the Act, 21 U.S.C. section 360bbb-3(b)(1), unless the authorization is terminated or revoked.  Performed at [facility], [address] [place], [place] [address]   Resp Panel by RT-PCR (Flu A&B, Covid) Anterior  Nasal Swab     Status: None   Collection Time: 04/29/22  2:01 PM   Specimen: Anterior Nasal Swab  Result Value Ref Range Status   SARS Coronavirus 2 by RT PCR NEGATIVE NEGATIVE Final    Comment: (NOTE) SARS-CoV-2 target nucleic acids are NOT DETECTED.  The SARS-CoV-2 RNA is generally detectable in upper respiratory specimens during the acute phase of infection. The lowest concentration of SARS-CoV-2 viral copies this assay can detect is 138 copies/mL. A negative result does not preclude SARS-Cov-2 infection and should not be used as the sole basis for treatment or other patient management decisions. A negative result may occur with  improper specimen collection/handling, submission of specimen other than nasopharyngeal swab, presence of viral mutation(s) within the areas targeted by this assay, and inadequate number of viral copies(<138 copies/mL). A negative result must be combined with clinical observations, patient history, and epidemiological information. The expected result is Negative.  Fact Sheet for Patients:  EntrepreneurPulse.com.au  Fact Sheet for Healthcare Providers:  IncredibleEmployment.be  This test is no t yet approved or cleared by the [place] FDA and  has been authorized for detection and/or diagnosis of SARS-CoV-2 by FDA under an Emergency Use Authorization (EUA). This EUA will remain  in effect (meaning this test can be used) for the duration of the COVID-19 declaration under Section 564(b)(1) of the Act, 21 U.S.C.section 360bbb-3(b)(1), unless the authorization is terminated  or revoked sooner.       Influenza A by PCR NEGATIVE NEGATIVE Final   Influenza B by PCR NEGATIVE NEGATIVE Final    Comment: (NOTE) The Xpert Xpress SARS-CoV-2/FLU/RSV plus assay is intended as an aid in the diagnosis of influenza from Nasopharyngeal swab specimens and should not be used as a sole basis for treatment. Nasal washings  and aspirates are unacceptable for Xpert Xpress SARS-CoV-2/FLU/RSV testing.  Fact Sheet for Patients: EntrepreneurPulse.com.au  Fact Sheet for Healthcare Providers: IncredibleEmployment.be  This test is not yet approved or cleared by the [place] FDA and has been authorized for detection and/or diagnosis of SARS-CoV-2 by FDA under an Emergency Use Authorization (EUA). This EUA will remain in effect (meaning this test can be used) for the duration of the COVID-19 declaration under Section 564(b)(1) of the Act, 21 U.S.C. section 360bbb-3(b)(1), unless the authorization is terminated or revoked.  Performed at [facility], [address] [place], [place] [address]   C Difficile Quick Screen w PCR reflex     Status: Abnormal   Collection Time: 04/30/22 12:07 PM   Specimen: STOOL  Result Value Ref Range Status   C Diff antigen POSITIVE (A) NEGATIVE Final   C Diff toxin NEGATIVE NEGATIVE Final   C Diff interpretation Results are indeterminate. See PCR results.  Final    Comment: Performed at [facility], [address] [place], [place] [address]  C. Diff by PCR, Reflexed     Status: Abnormal   Collection Time: 04/30/22 12:07 PM  Result Value Ref Range Status   Toxigenic C. Difficile by PCR POSITIVE (A) NEGATIVE Final    Comment: Positive for toxigenic C. difficile with little to no toxin production. Only treat if clinical presentation suggests symptomatic illness. Performed at [facility], [address] [place], [place] [address]   Culture, blood (Routine X 2) w Reflex to ID Panel     Status: None   Collection Time: 05/01/22  3:54 AM   Specimen: BLOOD LEFT HAND  Result Value Ref Range Status   Specimen Description BLOOD LEFT HAND  Final   Special Requests   Final    BOTTLES DRAWN AEROBIC AND ANAEROBIC Blood Culture adequate volume   Culture   Final    NO GROWTH 5 DAYS Performed at [facility],  [address], [place], [place] [address]    Report Status 05/06/2022 FINAL  Final  Culture, blood (Routine X 2) w Reflex to ID Panel     Status: None   Collection Time: 05/01/22  4:01 AM   Specimen: BLOOD LEFT HAND  Result Value Ref Range Status   Specimen Description BLOOD LEFT HAND  Final   Special Requests   Final    BOTTLES DRAWN AEROBIC AND ANAEROBIC Blood Culture adequate volume   Culture   Final    NO GROWTH 5 DAYS Performed at [facility], [address] [place], [place] [address]    Report Status 05/06/2022 FINAL  Final  Urine Culture     Status: Abnormal   Collection Time: 05/02/22 11:30 PM   Specimen: Urine, Clean Catch  Result Value Ref Range Status   Specimen Description   Final    URINE, CLEAN CATCH Performed at [facility], [address], [place], [place] [address]    Special Requests   Final    NONE Performed at [facility], [address] [place], [place] [address]    Culture 10,000 COLONIES/mL ENTEROCOCCUS FAECALIS (A)  Final   Report Status 05/05/2022 FINAL  Final   Organism ID, Bacteria ENTEROCOCCUS FAECALIS (A)  Final      Susceptibility   Enterococcus faecalis - MIC*    AMPICILLIN <=2 SENSITIVE Sensitive     NITROFURANTOIN <=16 SENSITIVE Sensitive     VANCOMYCIN 2 SENSITIVE Sensitive     * 10,000 COLONIES/mL  ENTEROCOCCUS FAECALIS    Procedures and diagnostic studies:  No results found.             LOS: 8 days   Tailer Volkert  [facility]   Pager on [email or web address]. If 7PM-7AM, please contact night-coverage at [email or web address]     05/07/2022, 12:26 PM

## 2022-05-07 NOTE — Plan of Care (Signed)
  Problem: Fluid Volume: Goal: Ability to maintain a balanced intake and output will improve Outcome: Progressing   Problem: Nutritional: Goal: Maintenance of adequate nutrition will improve Outcome: Progressing   

## 2022-05-08 ENCOUNTER — Other Ambulatory Visit (HOSPITAL_COMMUNITY): Payer: Self-pay

## 2022-05-08 ENCOUNTER — Telehealth (HOSPITAL_COMMUNITY): Payer: Self-pay | Admitting: Pharmacy Technician

## 2022-05-08 DIAGNOSIS — J9611 Chronic respiratory failure with hypoxia: Secondary | ICD-10-CM | POA: Diagnosis not present

## 2022-05-08 DIAGNOSIS — A0472 Enterocolitis due to Clostridium difficile, not specified as recurrent: Secondary | ICD-10-CM | POA: Diagnosis not present

## 2022-05-08 DIAGNOSIS — I5022 Chronic systolic (congestive) heart failure: Secondary | ICD-10-CM | POA: Diagnosis not present

## 2022-05-08 LAB — GLUCOSE, CAPILLARY
Glucose-Capillary: 140 mg/dL — ABNORMAL HIGH (ref 70–99)
Glucose-Capillary: 165 mg/dL — ABNORMAL HIGH (ref 70–99)
Glucose-Capillary: 137 mg/dL — ABNORMAL HIGH (ref 70–99)
Glucose-Capillary: 157 mg/dL — ABNORMAL HIGH (ref 70–99)

## 2022-05-08 MED ORDER — OXYCODONE HCL 5 MG PO TABS
5.0000 mg | ORAL_TABLET | Freq: Three times a day (TID) | ORAL | Status: DC | PRN
  Administered 2022-05-09 – 2022-05-10 (×3): 5 mg via ORAL
  Filled 2022-05-08 (×3): qty 1

## 2022-05-08 NOTE — Telephone Encounter (Signed)
Pharmacy Patient Advocate Encounter  Insurance verification completed.    The patient is insured through [employer] Part [employer]   The patient is currently admitted and ran test claims for the following: D[employer].  Copays and coinsurance results were relayed to Inpatient clinical team.

## 2022-05-08 NOTE — Progress Notes (Addendum)
Physical Therapy Treatment Patient Details Name: [name] MRN: [id number] DOB: [birth year] Today's Date: 05/08/2022   History of Present Illness 71 year old female with history of hyperlipidemia, insulin-dependent diabetes mellitus, GERD, morbid obesity, mostly bedbound, presented to the ED on 04/29/2022 for evaluation of altered mental status, dysuria and concerns about her leg wounds and redness.  She also reported a productive cough for several months which she attributes to reflux.  Resident at ALF ([facility]) since a rehab stay at [facility] this past summer.  She reports strength and mobility have steadily declined over the last year and she's had several falls.  Now with UTI.    PT Comments    Pt in bed.  Awake and agrees to session despite general fatigue.  She does participate in limited BLE AAROM.  Tender to touch BLE and needs cues to stay on task for correct ex.  C/o r shoulder pain also.  She is SOB on arrival,  does mouth breathe vs through nose with O2.  Cues to correct.  Sats low 90's.  R hand reads better/higher than L.    Limited ability to assist with repositioning in bed or for transition to sitting.  Declined sitting EOB due to fatigue and general pain.   She remains SNF appropriate due to continued global weakness, pain and level of assist needed for mobility/care.   Recommendations for follow up therapy are one component of a multi-disciplinary discharge planning process, led by the attending physician.  Recommendations may be updated based on patient status, additional functional criteria and insurance authorization.  Follow Up Recommendations  Skilled nursing-short term rehab (<3 hours/day)     Assistance Recommended at Discharge Frequent or constant Supervision/Assistance  Patient can return home with the following Two people to help with walking and/or transfers;Two people to help with bathing/dressing/bathroom;Assist for transportation;Help with stairs or ramp for  entrance;Assistance with cooking/housework   Equipment Recommendations  None recommended by PT    Recommendations for Other Services       Precautions / Restrictions Precautions Precautions: Fall Restrictions Weight Bearing Restrictions: No     Mobility  Bed Mobility                    Transfers                        Ambulation/Gait                   Stairs             Wheelchair Mobility    Modified Rankin (Stroke Patients Only)       Balance                                            Cognition Arousal/Alertness: Awake/alert Behavior During Therapy: Anxious, WFL for tasks assessed/performed                                            Exercises Other Exercises Other Exercises: BLE AAROM x 10 - cues to stay on task and tender to touch to assist.    General Comments        Pertinent Vitals/Pain Pain Assessment Pain Assessment: Faces Faces Pain Scale: Hurts even more  Pain Location: BLE to touch, r arm and general pain "all over" Pain Descriptors / Indicators: Grimacing, Guarding, Discomfort Pain Intervention(s): Monitored during session, Repositioned    Home Living                          Prior Function            PT Goals (current goals can now be found in the care plan section) Progress towards PT goals: Not progressing toward goals - comment    Frequency    Min 2X/week      PT Plan Current plan remains appropriate    Co-evaluation              AM-PAC PT "6 Clicks" Mobility   Outcome Measure  Help needed turning from your back to your side while in a flat bed without using bedrails?: Total Help needed moving from lying on your back to sitting on the side of a flat bed without using bedrails?: Total Help needed moving to and from a bed to a chair (including a wheelchair)?: Total Help needed standing up from a chair using your arms (e.g., wheelchair  or bedside chair)?: Total Help needed to walk in hospital room?: Total Help needed climbing 3-5 steps with a railing? : Total 6 Click Score: 6    End of Session Equipment Utilized During Treatment: Oxygen Activity Tolerance: Patient limited by fatigue Patient left: in bed;with call bell/phone within reach;with bed alarm set Nurse Communication: Mobility status PT Visit Diagnosis: Muscle weakness (generalized) (M62.81);History of falling (Z91.81);Difficulty in walking, not elsewhere classified (R26.2);Pain Pain - Right/Left: Right (both legs and R arm) Pain - part of body: Leg;Arm     Time: 5883-2549 PT Time Calculation (min) (ACUTE ONLY): 11 min  Charges:  $Therapeutic Activity: 8-22 mins                  Chesley Noon, PTA 05/08/22, 10:23 AM

## 2022-05-08 NOTE — Progress Notes (Signed)
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  She also reported a productive cough for several months which she attributes to reflux.    Pt lives in [place] since a rehab stay at [facility] this past summer.  She reports strength and mobility have recently declined again, and she's had several falls.  PT/OT evaluations ordered.   Evaluation revealed likely UTI, and there is concern for cellulitis related to her b/l leg wounds.  Started on empiric Rocephin pending urine culture results.          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 difficile diarrhea   Generalized weakness   Elevated troponin   Chronic systolic CHF (congestive heart failure) (HCC)   Obstructive sleep apnea   Diabetes mellitus without complication (HCC)   Paroxysmal atrial fibrillation (HCC)   Morbid obesity (HCC)   CAD (coronary artery disease)   Stage 3b chronic kidney disease (CKD) (HCC)   GERD (gastroesophageal reflux disease)   Intertriginous candidiasis   Hypoglycemia   Chronic respiratory failure with hypoxia (HCC)    Body mass index is 51.73 kg/m.  (Morbid obesity)   Acute metabolic encephalopathy Improved   Generalized weakness She came from ALF.   Discharge to SNF recommended by PT  C. difficile diarrhea Pt noted to have fecal incontinence with loose / water stools.  C diff tested, showed positive antigen, but negative toxin.  Given symptomatic, will proceed with treatment. Completed 7 days of oral vancomycin on 05/07/2022.  She was started on fidaxomicin on 05/07/2022 because of persistent diarrhea.   UTI (urinary tract infection) Urine culture collected on 05/02/2022 showed Enterococcus faecalis. She completed 3 days of IV ceftriaxone on 05/01/2022. Completed 3-day course of amoxicillin on 05/07/2022  Hypoxia, likely chronic hypoxic respiratory failure Oxygen saturation went down to 84% on room on 05/04/2022.  She was asymptomatic from this.  Continue 2 L/min oxygen via [place].  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  Due to demand ischemia in setting of acute illness. No chest pain or acute ischemic EKG changes. Started on empiric heparin on admission but this has been discontinued.    Chronic systolic CHF (congestive heart failure) ([place]) Compensated Echo from 08/27/21 -- EF 40-45%, indeterminate diastolic parameters, mild MR Hold Lasix because of persistent diarrhea.   Paroxysmal atrial fibrillation (HCC) Heart rate is controlled.  Continue Eliquis   Diabetes mellitus without complication ([place]), recent hypoglycemia Continue Levemir 35 units daily (dose decreased from 60 units) NovoLog as needed for hyperglycemia.    Intertriginous candidiasis Continue nystatin powder   Other comorbidities  include OSA (refuses to wear CPAP), CAD, GERD, CKD stage IIIb     Diet Order             Diet heart healthy/carb modified Room service appropriate? Yes; Fluid consistency: Thin  Diet effective now                             Consultants: None  Procedures: None    Medications:    apixaban  5 mg Oral BID   atorvastatin  40 mg Oral q1800   feeding supplement (PRO-STAT SUGAR FREE 64)  30 mL Oral BID   fenofibrate  160 mg Oral Daily   fidaxomicin  200 mg Oral BID   insulin aspart  0-5 Units Subcutaneous QHS   insulin aspart  0-9 Units Subcutaneous TID WC   insulin detemir  35 Units Subcutaneous Daily   mirabegron ER  50 mg Oral Daily   nystatin   Topical TID   pantoprazole  40 mg Oral Daily   Continuous Infusions:   Anti-infectives (From admission, onward)    Start     Dose/Rate Route Frequency Ordered Stop   05/07/22 1000  fidaxomicin (DIFICID) tablet 200 mg        200 mg Oral 2 times daily 05/07/22 0912 05/17/22 0959   05/05/22 1615  amoxicillin (AMOXIL) capsule 500 mg  Status:  Discontinued        500 mg Oral Every 8 hours 05/05/22 1529 05/05/22 1529   05/05/22 1615  amoxicillin (AMOXIL) capsule 500 mg        500 mg Oral Every 8 hours 05/05/22 1529 05/08/22 0549   05/01/22 1400  vancomycin (VANCOCIN) capsule 125 mg  Status:  Discontinued        125 mg Oral 4 times daily 05/01/22 1230 05/07/22 0912   05/01/22 1000  vancomycin (VANCOCIN) capsule 125 mg  Status:  Discontinued        125 mg Oral 4 times daily 05/01/22 0847 05/01/22 0847   04/30/22 1400  cefTRIAXone (ROCEPHIN) 2 g in sodium chloride 0.9 % 100 mL IVPB  Status:  Discontinued        2 g 200 mL/hr over 30 Minutes Intravenous Every 24 hours 04/29/22 1902 05/02/22 0741   04/29/22 1345  cefTRIAXone (ROCEPHIN) 1 g in sodium chloride 0.9 % 100 mL IVPB        1 g 200 mL/hr over 30 Minutes Intravenous  Once 04/29/22 1336 04/29/22 1539              Family Communication/Anticipated D/C date and plan/Code Status   DVT prophylaxis: Place TED hose Start: 04/29/22 1351 apixaban (ELIQUIS) tablet 5 mg     Code Status: Full Code  Family Communication: None Disposition Plan: Plan to discharge to SNF   Status  is: Inpatient Remains inpatient appropriate because: Awaiting placement to SNF       Subjective:   Interval events noted.  She is still having diarrhea.  2 CNA's were at the bedside, changing her bed linen  Objective:    Vitals:   05/07/22 2139 05/08/22 0654 05/08/22 0738 05/08/22 0800  BP: 134/65 (!) 145/76 (!) 149/70 (!) 148/67  Pulse: 62 66 64 66  Resp: 18 17 20    Temp: 97.6 F (36.4 C) 98 F (36.7 C) 98 F (36.7 C)   TempSrc:   Oral   SpO2: 93% 94% 96%   Weight:      Height:  No data found.   Intake/Output Summary (Last 24 hours) at 05/08/2022 1136 Last data filed at 05/08/2022 1053 Gross per 24 hour  Intake 600 ml  Output 1350 ml  Net -750 ml   Filed Weights   04/29/22 1131  Weight: (!) 141 kg    Exam:  GEN: NAD SKIN: Moisture associated redness on bilateral buttocks EYES: EOMI ENT: MMM CV: RRR PULM: CTA B ABD: soft, obese, NT, +BS CNS: AAO x 3, non focal EXT: Chronic erythema and wounds on b/l legs      Data Reviewed:   I have personally reviewed following labs and imaging studies:  Labs: Labs show the following:   Basic Metabolic Panel: Recent Labs  Lab 05/02/22 0445 05/06/22 1133  NA 142 139  K 3.9 4.5  CL 107 103  CO2 28 30  GLUCOSE 65* 165*  BUN 41* 39*  CREATININE 1.68* 1.65*  CALCIUM 8.5* 8.4*  MG 2.1 2.1  PHOS  --  3.4   GFR Estimated Creatinine Clearance: 44.7 mL/min (A) (by C-G formula based on SCr of 1.65 mg/dL (H)). Liver Function Tests: No results for input(s): "AST", "ALT", "ALKPHOS", "BILITOT", "PROT", "ALBUMIN" in the last 168 hours.  No results for input(s): "LIPASE", "AMYLASE" in the last 168 hours. No results for input(s): "AMMONIA" in the last 168 hours. Coagulation profile No results for input(s): "INR", "PROTIME" in the last 168 hours.   CBC: Recent Labs  Lab 05/02/22 0445 05/05/22 0333  WBC 7.7 8.4  HGB 11.1* 11.8*  HCT 36.8 39.8  MCV 84.0 86.3  PLT 199 199   Cardiac Enzymes: No  results for input(s): "CKTOTAL", "CKMB", "CKMBINDEX", "TROPONINI" in the last 168 hours. BNP (last 3 results) No results for input(s): "PROBNP" in the last 8760 hours. CBG: Recent Labs  Lab 05/07/22 0823 05/07/22 1158 05/07/22 1730 05/07/22 2141 05/08/22 0804  GLUCAP 124* 123* 141* 158* 140*   D-Dimer: No results for input(s): "DDIMER" in the last 72 hours. Hgb A1c: No results for input(s): "HGBA1C" in the last 72 hours. Lipid Profile: No results for input(s): "CHOL", "HDL", "LDLCALC", "TRIG", "CHOLHDL", "LDLDIRECT" in the last 72 hours. Thyroid function studies: No results for input(s): "TSH", "T4TOTAL", "T3FREE", "THYROIDAB" in the last 72 hours.  Invalid input(s): "FREET3" Anemia work up: No results for input(s): "VITAMINB12", "FOLATE", "FERRITIN", "TIBC", "IRON", "RETICCTPCT" in the last 72 hours. Sepsis Labs: Recent Labs  Lab 05/02/22 0445 05/05/22 0333  WBC 7.7 8.4    Microbiology Recent Results (from the past 240 hour(s))  Resp Panel by RT-PCR (Flu A&B, Covid) Anterior Nasal Swab     Status: None   Collection Time: 04/28/22  3:14 PM   Specimen: Anterior Nasal Swab  Result Value Ref Range Status   SARS Coronavirus 2 by RT PCR NEGATIVE NEGATIVE Final    Comment: (NOTE) SARS-CoV-2 target nucleic acids are NOT DETECTED.  The SARS-CoV-2 RNA is generally detectable in upper respiratory specimens during the acute phase of infection. The lowest concentration of SARS-CoV-2 viral copies this assay can detect is 138 copies/mL. A negative result does not preclude SARS-Cov-2 infection and should not be used as the sole basis for treatment or other patient management decisions. A negative result may occur with  improper specimen collection/handling, submission of specimen other than nasopharyngeal swab, presence of viral mutation(s) within the areas targeted by this assay, and inadequate number of viral copies(<138 copies/mL). A negative result must be combined  with clinical observations, patient history, and epidemiological information. The expected result is  Negative.  Fact Sheet for Patients:  EntrepreneurPulse.com.au  Fact Sheet for Healthcare Providers:  IncredibleEmployment.be  This test is no t yet approved or cleared by the [place] FDA and  has been authorized for detection and/or diagnosis of SARS-CoV-2 by FDA under an Emergency Use Authorization (EUA). This EUA will remain  in effect (meaning this test can be used) for the duration of the COVID-19 declaration under Section 564(b)(1) of the Act, 21 U.S.C.section 360bbb-3(b)(1), unless the authorization is terminated  or revoked sooner.       Influenza A by PCR NEGATIVE NEGATIVE Final   Influenza B by PCR NEGATIVE NEGATIVE Final    Comment: (NOTE) The Xpert Xpress SARS-CoV-2/FLU/RSV plus assay is intended as an aid in the diagnosis of influenza from Nasopharyngeal swab specimens and should not be used as a sole basis for treatment. Nasal washings and aspirates are unacceptable for Xpert Xpress SARS-CoV-2/FLU/RSV testing.  Fact Sheet for Patients: EntrepreneurPulse.com.au  Fact Sheet for Healthcare Providers: IncredibleEmployment.be  This test is not yet approved or cleared by the [place] FDA and has been authorized for detection and/or diagnosis of SARS-CoV-2 by FDA under an Emergency Use Authorization (EUA). This EUA will remain in effect (meaning this test can be used) for the duration of the COVID-19 declaration under Section 564(b)(1) of the Act, 21 U.S.C. section 360bbb-3(b)(1), unless the authorization is terminated or revoked.  Performed at [facility], [address] [place], [place] [address]   Resp Panel by RT-PCR (Flu A&B, Covid) Anterior Nasal Swab     Status: None   Collection Time: 04/29/22  2:01 PM   Specimen: Anterior Nasal Swab  Result Value Ref Range Status    SARS Coronavirus 2 by RT PCR NEGATIVE NEGATIVE Final    Comment: (NOTE) SARS-CoV-2 target nucleic acids are NOT DETECTED.  The SARS-CoV-2 RNA is generally detectable in upper respiratory specimens during the acute phase of infection. The lowest concentration of SARS-CoV-2 viral copies this assay can detect is 138 copies/mL. A negative result does not preclude SARS-Cov-2 infection and should not be used as the sole basis for treatment or other patient management decisions. A negative result may occur with  improper specimen collection/handling, submission of specimen other than nasopharyngeal swab, presence of viral mutation(s) within the areas targeted by this assay, and inadequate number of viral copies(<138 copies/mL). A negative result must be combined with clinical observations, patient history, and epidemiological information. The expected result is Negative.  Fact Sheet for Patients:  EntrepreneurPulse.com.au  Fact Sheet for Healthcare Providers:  IncredibleEmployment.be  This test is no t yet approved or cleared by the [place] FDA and  has been authorized for detection and/or diagnosis of SARS-CoV-2 by FDA under an Emergency Use Authorization (EUA). This EUA will remain  in effect (meaning this test can be used) for the duration of the COVID-19 declaration under Section 564(b)(1) of the Act, 21 U.S.C.section 360bbb-3(b)(1), unless the authorization is terminated  or revoked sooner.       Influenza A by PCR NEGATIVE NEGATIVE Final   Influenza B by PCR NEGATIVE NEGATIVE Final    Comment: (NOTE) The Xpert Xpress SARS-CoV-2/FLU/RSV plus assay is intended as an aid in the diagnosis of influenza from Nasopharyngeal swab specimens and should not be used as a sole basis for treatment. Nasal washings and aspirates are unacceptable for Xpert Xpress SARS-CoV-2/FLU/RSV testing.  Fact Sheet for  Patients: EntrepreneurPulse.com.au  Fact Sheet for Healthcare Providers: IncredibleEmployment.be  This test is not yet approved or  cleared by the [place] and has been authorized for detection and/or diagnosis of SARS-CoV-2 by FDA under an Emergency Use Authorization (EUA). This EUA will remain in effect (meaning this test can be used) for the duration of the COVID-19 declaration under Section 564(b)(1) of the Act, 21 U.S.C. section 360bbb-3(b)(1), unless the authorization is terminated or revoked.  Performed at [facility], [address] [place], [place] [address]   C Difficile Quick Screen w PCR reflex     Status: Abnormal   Collection Time: 04/30/22 12:07 PM   Specimen: STOOL  Result Value Ref Range Status   C Diff antigen POSITIVE (A) NEGATIVE Final   C Diff toxin NEGATIVE NEGATIVE Final   C Diff interpretation Results are indeterminate. See PCR results.  Final    Comment: Performed at [facility], [address] [place], [place] [address]  C. Diff by PCR, Reflexed     Status: Abnormal   Collection Time: 04/30/22 12:07 PM  Result Value Ref Range Status   Toxigenic C. Difficile by PCR POSITIVE (A) NEGATIVE Final    Comment: Positive for toxigenic C. difficile with little to no toxin production. Only treat if clinical presentation suggests symptomatic illness. Performed at [facility], [address] [place], [place] [address]   Culture, blood (Routine X 2) w Reflex to ID Panel     Status: None   Collection Time: 05/01/22  3:54 AM   Specimen: BLOOD LEFT HAND  Result Value Ref Range Status   Specimen Description BLOOD LEFT HAND  Final   Special Requests   Final    BOTTLES DRAWN AEROBIC AND ANAEROBIC Blood Culture adequate volume   Culture   Final    NO GROWTH 5 DAYS Performed at [facility], [address], [place], [place] [address]    Report Status 05/06/2022 FINAL  Final   Culture, blood (Routine X 2) w Reflex to ID Panel     Status: None   Collection Time: 05/01/22  4:01 AM   Specimen: BLOOD LEFT HAND  Result Value Ref Range Status   Specimen Description BLOOD LEFT HAND  Final   Special Requests   Final    BOTTLES DRAWN AEROBIC AND ANAEROBIC Blood Culture adequate volume   Culture   Final    NO GROWTH 5 DAYS Performed at [facility], [address], [place], [place] [address]    Report Status 05/06/2022 FINAL  Final  Urine Culture     Status: Abnormal   Collection Time: 05/02/22 11:30 PM   Specimen: Urine, Clean Catch  Result Value Ref Range Status   Specimen Description   Final    URINE, CLEAN CATCH Performed at [facility], [address] [place], [place] [address]    Special Requests   Final    NONE Performed at [facility], [address] [place], [place] [address]    Culture 10,000 COLONIES/mL ENTEROCOCCUS FAECALIS (A)  Final   Report Status 05/05/2022 FINAL  Final   Organism ID, Bacteria ENTEROCOCCUS FAECALIS (A)  Final      Susceptibility   Enterococcus faecalis - MIC*    AMPICILLIN <=2 SENSITIVE Sensitive     NITROFURANTOIN <=16 SENSITIVE Sensitive     VANCOMYCIN 2 SENSITIVE Sensitive     * 10,000 COLONIES/mL ENTEROCOCCUS FAECALIS    Procedures and diagnostic studies:  No results found.             LOS: 9 days  Tanner Vigna  [employer], advertising on [email or web address]. If 7PM-7AM, please contact night-coverage at [email or web address]     05/08/2022, 11:36 AM

## 2022-05-08 NOTE — TOC Progression Note (Signed)
Transition of Care [facility]) - Progression Note    Patient Details  Name: [name] MRN: [id number] Date of Birth: 1951/06/10  Transition of Care [facility]) CM/SW [place], [place] Phone Number: 05/08/2022, 3:45 PM  Clinical Narrative:     TOC spoke to the patient and family and they have selected [facility].    Expected Discharge Plan: [place]    Expected Discharge Plan and Services Expected Discharge Plan: [facility]                                         Social Determinants of Health (SDOH) Interventions    Readmission Risk Interventions     No data to display

## 2022-05-08 NOTE — TOC Benefit Eligibility Note (Signed)
Patient Teacher, English as a foreign language completed.    The patient is currently admitted and upon discharge could be taking Dificid 200 mg.  The current 10 day co-pay is $0.00.   The patient is insured through [employer], [employer] Patient Advocate Specialist [place] Patient Advocate Team Direct Number: [phone]  Fax: [phone]

## 2022-05-09 DIAGNOSIS — I5022 Chronic systolic (congestive) heart failure: Secondary | ICD-10-CM | POA: Diagnosis not present

## 2022-05-09 DIAGNOSIS — A0472 Enterocolitis due to Clostridium difficile, not specified as recurrent: Secondary | ICD-10-CM | POA: Diagnosis not present

## 2022-05-09 LAB — GLUCOSE, CAPILLARY
Glucose-Capillary: 112 mg/dL — ABNORMAL HIGH (ref 70–99)
Glucose-Capillary: 175 mg/dL — ABNORMAL HIGH (ref 70–99)
Glucose-Capillary: 150 mg/dL — ABNORMAL HIGH (ref 70–99)
Glucose-Capillary: 190 mg/dL — ABNORMAL HIGH (ref 70–99)

## 2022-05-09 NOTE — Progress Notes (Signed)
Progress Note    Tsuyako Jolley Germain  FTD:[id number] DOB: Oct 03, 1950  DOA: 04/29/2022 PCP: Orvis Brill, [facility]      Brief Narrative:    Medical records reviewed and are as summarized below:    Ms. 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  She also reported a productive cough for several months which she attributes to reflux.    Pt lives in [place] ([facility] [place] since a rehab stay at [facility] this past summer.  She reports strength and mobility have recently declined again, and she's had several falls.  PT/OT evaluations ordered.   Evaluation revealed likely UTI, and there is concern for cellulitis related to her b/l leg wounds.  Started on empiric Rocephin pending urine culture results.          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 difficile diarrhea   Generalized weakness   Elevated troponin   Chronic systolic CHF (congestive heart failure) (HCC)   Obstructive sleep apnea   Diabetes mellitus without complication (HCC)   Paroxysmal atrial fibrillation (HCC)   Morbid obesity (HCC)   CAD (coronary artery disease)   Stage 3b chronic kidney disease (CKD) (HCC)   GERD (gastroesophageal reflux disease)   Intertriginous candidiasis   Hypoglycemia   Chronic respiratory failure with hypoxia (HCC)    Body mass index is 51.73 kg/m.  (Morbid obesity)   Acute metabolic encephalopathy Improved   Generalized weakness Continue PT and OT  while inpatient.  She came from ALF.  Awaiting placement to SNF  C. difficile diarrhea Pt noted to have fecal incontinence with loose / water stools.  C diff tested, showed positive antigen, but negative toxin. Completed 7 days of oral vancomycin on 05/07/2022.  She was started on fidaxomicin on 05/07/2022 because of persistent diarrhea.  Continue fidaxomicin for total of 10 days.   UTI (urinary tract infection) Urine culture collected on 05/02/2022 showed Enterococcus faecalis. She completed 3 days of IV ceftriaxone on 05/01/2022. Completed 3-day course of amoxicillin on 05/07/2022  Hypoxia, likely chronic hypoxic respiratory failure Oxygen saturation went down to 84% on room on 05/04/2022.  She was asymptomatic from this.  Continue 2 L/min oxygen via [place].  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  Due to demand ischemia in setting of acute illness. No chest pain or acute ischemic EKG changes. Started on empiric heparin on admission but this has been discontinued.    Chronic systolic CHF (congestive heart failure) ([place]) Compensated Echo from 08/27/21 -- EF 40-45%, indeterminate diastolic parameters, mild MR Lasix has been held because of persistent diarrhea.  Paroxysmal atrial fibrillation (HCC) Heart rate is controlled.  Continue Eliquis   Diabetes mellitus without complication ([place]), recent hypoglycemia Continue Levemir 35 units daily (dose decreased from 60 units) NovoLog as needed for hyperglycemia.    Intertriginous candidiasis  Continue nystatin powder   Other comorbidities include OSA (refuses to wear CPAP), CAD, GERD, CKD stage IIIb     Diet Order             Diet heart healthy/carb modified Room service appropriate? Yes; Fluid consistency: Thin  Diet effective  now                            Consultants: None  Procedures: None    Medications:    apixaban  5 mg Oral BID   atorvastatin  40 mg Oral q1800   feeding supplement (PRO-STAT SUGAR FREE 64)  30 mL Oral BID   fenofibrate  160 mg Oral Daily   fidaxomicin  200 mg Oral BID   insulin aspart  0-5 Units Subcutaneous QHS   insulin aspart  0-9 Units Subcutaneous TID WC   insulin detemir  35 Units Subcutaneous Daily   mirabegron ER  50 mg Oral Daily   nystatin   Topical TID   pantoprazole  40 mg Oral Daily   Continuous Infusions:   Anti-infectives (From admission, onward)    Start     Dose/Rate Route Frequency Ordered Stop   05/07/22 1000  fidaxomicin (DIFICID) tablet 200 mg        200 mg Oral 2 times daily 05/07/22 0912 05/17/22 0959   05/05/22 1615  amoxicillin (AMOXIL) capsule 500 mg  Status:  Discontinued        500 mg Oral Every 8 hours 05/05/22 1529 05/05/22 1529   05/05/22 1615  amoxicillin (AMOXIL) capsule 500 mg        500 mg Oral Every 8 hours 05/05/22 1529 05/08/22 0549   05/01/22 1400  vancomycin (VANCOCIN) capsule 125 mg  Status:  Discontinued        125 mg Oral 4 times daily 05/01/22 1230 05/07/22 0912   05/01/22 1000  vancomycin (VANCOCIN) capsule 125 mg  Status:  Discontinued        125 mg Oral 4 times daily 05/01/22 0847 05/01/22 0847   04/30/22 1400  cefTRIAXone (ROCEPHIN) 2 g in sodium chloride 0.9 % 100 mL IVPB  Status:  Discontinued        2 g 200 mL/hr over 30 Minutes Intravenous Every 24 hours 04/29/22 1902 05/02/22 0741   04/29/22 1345  cefTRIAXone (ROCEPHIN) 1 g in sodium chloride 0.9 % 100 mL IVPB        1 g 200 mL/hr over 30 Minutes Intravenous  Once 04/29/22 1336 04/29/22 1539              Family Communication/Anticipated D/C date and plan/Code Status   DVT prophylaxis: Place TED hose Start: 04/29/22 1351 apixaban (ELIQUIS) tablet 5 mg     Code Status: Full Code  Family Communication: None Disposition Plan: Plan to  discharge to SNF   Status is: Inpatient Remains inpatient appropriate because: Awaiting placement to SNF       Subjective:   Interval events noted.  She feels better today.  Volume of stools is decreasing.  Robert Bellow, RN, was at the bedside  Objective:    Vitals:   05/08/22 1629 05/08/22 2134 05/09/22 0506 05/09/22 0804  BP: 131/66 (!) 147/57 139/61 (!) 133/58  Pulse: 62 (!) 59 60 (!) 57  Resp: 18 20 20 20   Temp: 97.7 F (36.5 C) 97.9 F (36.6 C) 98.2 F (36.8 C) 98 F (36.7 C)  TempSrc: Oral     SpO2: 92%  96% 93% 92%  Weight:      Height:       No data found.   Intake/Output Summary (Last 24 hours) at 05/09/2022 1205 Last data filed at 05/09/2022 0506 Gross per 24 hour  Intake --  Output 1250 ml  Net -1250 ml   Filed Weights   04/29/22 1131  Weight: (!) 141 kg    Exam:  GEN: NAD SKIN: Warm and dry EYES: EOMI ENT: MMM CV: RRR PULM: CTA B ABD: soft, obese, NT, +BS CNS: AAO x 3, non focal EXT: Chronic erythema and wounds on bilateral legs     Data Reviewed:   I have personally reviewed following labs and imaging studies:  Labs: Labs show the following:   Basic Metabolic Panel: Recent Labs  Lab 05/06/22 1133  NA 139  K 4.5  CL 103  CO2 30  GLUCOSE 165*  BUN 39*  CREATININE 1.65*  CALCIUM 8.4*  MG 2.1  PHOS 3.4   GFR Estimated Creatinine Clearance: 44.7 mL/min (A) (by C-G formula based on SCr of 1.65 mg/dL (H)). Liver Function Tests: No results for input(s): "AST", "ALT", "ALKPHOS", "BILITOT", "PROT", "ALBUMIN" in the last 168 hours.  No results for input(s): "LIPASE", "AMYLASE" in the last 168 hours. No results for input(s): "AMMONIA" in the last 168 hours. Coagulation profile No results for input(s): "INR", "PROTIME" in the last 168 hours.   CBC: Recent Labs  Lab 05/05/22 0333  WBC 8.4  HGB 11.8*  HCT 39.8  MCV 86.3  PLT 199   Cardiac Enzymes: No results for input(s): "CKTOTAL", "CKMB", "CKMBINDEX", "TROPONINI" in  the last 168 hours. BNP (last 3 results) No results for input(s): "PROBNP" in the last 8760 hours. CBG: Recent Labs  Lab 05/08/22 1158 05/08/22 1630 05/08/22 2136 05/09/22 0757 05/09/22 1143  GLUCAP 165* 137* 157* 112* 175*   D-Dimer: No results for input(s): "DDIMER" in the last 72 hours. Hgb A1c: No results for input(s): "HGBA1C" in the last 72 hours. Lipid Profile: No results for input(s): "CHOL", "HDL", "LDLCALC", "TRIG", "CHOLHDL", "LDLDIRECT" in the last 72 hours. Thyroid function studies: No results for input(s): "TSH", "T4TOTAL", "T3FREE", "THYROIDAB" in the last 72 hours.  Invalid input(s): "FREET3" Anemia work up: No results for input(s): "VITAMINB12", "FOLATE", "FERRITIN", "TIBC", "IRON", "RETICCTPCT" in the last 72 hours. Sepsis Labs: Recent Labs  Lab 05/05/22 0333  WBC 8.4    Microbiology Recent Results (from the past 240 hour(s))  Resp Panel by RT-PCR (Flu A&B, Covid) Anterior Nasal Swab     Status: None   Collection Time: 04/29/22  2:01 PM   Specimen: Anterior Nasal Swab  Result Value Ref Range Status   SARS Coronavirus 2 by RT PCR NEGATIVE NEGATIVE Final    Comment: (NOTE) SARS-CoV-2 target nucleic acids are NOT DETECTED.  The SARS-CoV-2 RNA is generally detectable in upper respiratory specimens during the acute phase of infection. The lowest concentration of SARS-CoV-2 viral copies this assay can detect is 138 copies/mL. A negative result does not preclude SARS-Cov-2 infection and should not be used as the sole basis for treatment or other patient management decisions. A negative result may occur with  improper specimen collection/handling, submission of specimen other than nasopharyngeal swab, presence of viral mutation(s) within the areas targeted by this assay, and inadequate number of viral copies(<138 copies/mL). A negative result must be combined with clinical observations, patient history, and epidemiological information. The expected  result is Negative.  Fact Sheet for Patients:  EntrepreneurPulse.com.au  Fact Sheet for  Healthcare Providers:  IncredibleEmployment.be  This test is no t yet approved or cleared by the [place] and  has been authorized for detection and/or diagnosis of SARS-CoV-2 by FDA under an Emergency Use Authorization (EUA). This EUA will remain  in effect (meaning this test can be used) for the duration of the COVID-19 declaration under Section 564(b)(1) of the Act, 21 U.S.C.section 360bbb-3(b)(1), unless the authorization is terminated  or revoked sooner.       Influenza A by PCR NEGATIVE NEGATIVE Final   Influenza B by PCR NEGATIVE NEGATIVE Final    Comment: (NOTE) The Xpert Xpress SARS-CoV-2/FLU/RSV plus assay is intended as an aid in the diagnosis of influenza from Nasopharyngeal swab specimens and should not be used as a sole basis for treatment. Nasal washings and aspirates are unacceptable for Xpert Xpress SARS-CoV-2/FLU/RSV testing.  Fact Sheet for Patients: EntrepreneurPulse.com.au  Fact Sheet for Healthcare Providers: IncredibleEmployment.be  This test is not yet approved or cleared by the [place] FDA and has been authorized for detection and/or diagnosis of SARS-CoV-2 by FDA under an Emergency Use Authorization (EUA). This EUA will remain in effect (meaning this test can be used) for the duration of the COVID-19 declaration under Section 564(b)(1) of the Act, 21 U.S.C. section 360bbb-3(b)(1), unless the authorization is terminated or revoked.  Performed at [facility], [address] [place], [place] [address]   C Difficile Quick Screen w PCR reflex     Status: Abnormal   Collection Time: 04/30/22 12:07 PM   Specimen: STOOL  Result Value Ref Range Status   C Diff antigen POSITIVE (A) NEGATIVE Final   C Diff toxin NEGATIVE NEGATIVE Final   C Diff interpretation Results are  indeterminate. See PCR results.  Final    Comment: Performed at [facility], [address] [place], [place] [address]  C. Diff by PCR, Reflexed     Status: Abnormal   Collection Time: 04/30/22 12:07 PM  Result Value Ref Range Status   Toxigenic C. Difficile by PCR POSITIVE (A) NEGATIVE Final    Comment: Positive for toxigenic C. difficile with little to no toxin production. Only treat if clinical presentation suggests symptomatic illness. Performed at [facility], [address] [place], [place] [address]   Culture, blood (Routine X 2) w Reflex to ID Panel     Status: None   Collection Time: 05/01/22  3:54 AM   Specimen: BLOOD LEFT HAND  Result Value Ref Range Status   Specimen Description BLOOD LEFT HAND  Final   Special Requests   Final    BOTTLES DRAWN AEROBIC AND ANAEROBIC Blood Culture adequate volume   Culture   Final    NO GROWTH 5 DAYS Performed at [facility], [address], [place], [place] [address]    Report Status 05/06/2022 FINAL  Final  Culture, blood (Routine X 2) w Reflex to ID Panel     Status: None   Collection Time: 05/01/22  4:01 AM   Specimen: BLOOD LEFT HAND  Result Value Ref Range Status   Specimen Description BLOOD LEFT HAND  Final   Special Requests   Final    BOTTLES DRAWN AEROBIC AND ANAEROBIC Blood Culture adequate volume   Culture   Final    NO GROWTH 5 DAYS Performed at [facility], [address], [place], [place] [address]    Report Status 05/06/2022 FINAL  Final  Urine Culture     Status: Abnormal   Collection  Time: 05/02/22 11:30 PM   Specimen: Urine, Clean Catch  Result Value Ref Range Status   Specimen Description   Final    URINE, CLEAN CATCH Performed at [facility], [address], [place], [place] [address]    Special Requests   Final    NONE Performed at [facility], [place], [place] [address]    Culture 10,000 COLONIES/mL ENTEROCOCCUS FAECALIS  (A)  Final   Report Status 05/05/2022 FINAL  Final   Organism ID, Bacteria ENTEROCOCCUS FAECALIS (A)  Final      Susceptibility   Enterococcus faecalis - MIC*    AMPICILLIN <=2 SENSITIVE Sensitive     NITROFURANTOIN <=16 SENSITIVE Sensitive     VANCOMYCIN 2 SENSITIVE Sensitive     * 10,000 COLONIES/mL ENTEROCOCCUS FAECALIS    Procedures and diagnostic studies:  No results found.             LOS: 10 days   Jatorian Renault  [facility]   Pager on [email or web address]. If 7PM-7AM, please contact night-coverage at [email or web address]     05/09/2022, 12:05 PM

## 2022-05-09 NOTE — Progress Notes (Signed)
Occupational Therapy Treatment Patient Details Name: [name] MRN: [id number] DOB: 02/05/1951 Today's Date: 05/09/2022   History of present illness 71 year old female with history of hyperlipidemia, insulin-dependent diabetes mellitus, GERD, morbid obesity, mostly bedbound, presented to the ED on 04/29/2022 for evaluation of altered mental status, dysuria and concerns about her leg wounds and redness.  She also reported a productive cough for several months which she attributes to reflux.  Resident at ALF ([facility]) since a rehab stay at [facility] this past summer.  She reports strength and mobility have steadily declined over the last year and she's had several falls.  Now with UTI.   OT comments  Pt agreeable to OT/PT co-treatment to maximize safety and participation. Tx session targeted improving functional balance during ADL tasks in preparation for improved functional transfers and ADL task completion. Pt received supine in bed and required Mod A-Max A +2 for bed mobility. Pt engaged in seated grooming tasks with set up A as well as BUE/LE AROM exercises for improved ROM and strengthening. Attempted STS 3x, however, unable to clear hips from surface of bed despite Total A +2. Unable to complete lateral scooting in order to be closer to [facility]. Pt fatigued quickly and was returned to supine. Pt left as received with all needs in reach. Pt is making progress toward goal completion. D/C recommendation remains appropriate. OT will continue to follow acutely.     Recommendations for follow up therapy are one component of a multi-disciplinary discharge planning process, led by the attending physician.  Recommendations may be updated based on patient status, additional functional criteria and insurance authorization.    Follow Up Recommendations  Skilled nursing-short term rehab (<3 hours/day)     Assistance Recommended at Discharge Frequent or constant Supervision/Assistance  Patient can return  home with the following  Two people to help with walking and/or transfers;A lot of help with bathing/dressing/bathroom;Assistance with cooking/housework;Assist for transportation;Help with stairs or ramp for entrance   Equipment Recommendations  Other (comment) (defer to next venue of care)    Recommendations for Other Services      Precautions / Restrictions Precautions Precautions: Fall Precaution Comments: toes and legs tender Restrictions Weight Bearing Restrictions: No       Mobility Bed Mobility Overal bed mobility: Needs Assistance Bed Mobility: Rolling Rolling: Max assist, +2 for physical assistance   Supine to sit: Mod assist, +2 for physical assistance, HOB elevated Sit to supine: Max assist, +2 for physical assistance   General bed mobility comments: good effort to get to EOB today but does need significant assist and HOB raised, pt slightly able to assist with scooting up toward [facility] at end of session using bed rails    Transfers Overall transfer level: Needs assistance Equipment used: Rolling walker (2 wheels) Transfers: Sit to/from Stand Sit to Stand: Total assist, +2 physical assistance           General transfer comment: unble to clear hips x 3 attemps     Balance Overall balance assessment: Needs assistance Sitting-balance support: Feet supported Sitting balance-Leahy Scale: Good     Standing balance support: Bilateral upper extremity supported Standing balance-Leahy Scale: Zero                             ADL either performed or assessed with clinical judgement   ADL Overall ADL's : Needs assistance/impaired     Grooming: Set up;Wash/dry face;Brushing hair;Sitting   Upper Body Bathing:  Set up;Sitting           Lower Body Dressing: Maximal assistance;Sitting/lateral leans Lower Body Dressing Details (indicate cue type and reason): socks                    Extremity/Trunk Assessment Upper Extremity  Assessment Upper Extremity Assessment: Generalized weakness   Lower Extremity Assessment Lower Extremity Assessment: Generalized weakness        Vision Baseline Vision/History: 1 Wears glasses Patient Visual Report: No change from baseline     Perception     Praxis      Cognition Arousal/Alertness: Awake/alert Behavior During Therapy: WFL for tasks assessed/performed, Anxious Overall Cognitive Status: Within Functional Limits for tasks assessed                                          Exercises General Exercises - Upper Extremity Shoulder Flexion: AROM, Strengthening, Both, 10 reps, Seated Shoulder Extension: AROM, Strengthening, Both, 10 reps, Seated Elbow Flexion: AROM, Strengthening, Both, 10 reps, Seated Elbow Extension: AROM, Strengthening, Both, 10 reps, Seated    Shoulder Instructions       General Comments      Pertinent Vitals/ Pain       Pain Assessment Pain Assessment: Faces Faces Pain Scale: Hurts even more Pain Location: BLE to touch, toes and general discomfort from immobility Pain Descriptors / Indicators: Grimacing, Guarding, Discomfort Pain Intervention(s): Repositioned, Monitored during session, Limited activity within patient's tolerance  Home Living                                          Prior Functioning/Environment              Frequency  Min 2X/week        Progress Toward Goals  OT Goals(current goals can now be found in the care plan section)  Progress towards OT goals: Progressing toward goals  Acute Rehab OT Goals Patient Stated Goal: to regain strength OT Goal Formulation: With patient Time For Goal Achievement: 05/15/22 Potential to Achieve Goals: Good  Plan Discharge plan remains appropriate;Frequency remains appropriate    Co-evaluation    PT/OT/SLP Co-Evaluation/Treatment: Yes Reason for Co-Treatment: For patient/therapist safety;To address functional/ADL transfers PT  goals addressed during session: Mobility/safety with mobility;Balance;Strengthening/ROM;Proper use of DME OT goals addressed during session: ADL's and self-care;Strengthening/ROM      AM-PAC OT "6 Clicks" Daily Activity     Outcome Measure   Help from another person eating meals?: None Help from another person taking care of personal grooming?: A Little Help from another person toileting, which includes using toliet, bedpan, or urinal?: Total Help from another person bathing (including washing, rinsing, drying)?: A Lot Help from another person to put on and taking off regular upper body clothing?: A Little Help from another person to put on and taking off regular lower body clothing?: A Lot 6 Click Score: 15    End of Session Equipment Utilized During Treatment: Gait belt;Rolling walker (2 wheels)  OT Visit Diagnosis: Muscle weakness (generalized) (M62.81);Repeated falls (R29.6)   Activity Tolerance Patient limited by fatigue   Patient Left in bed;with call bell/phone within reach;with bed alarm set   Nurse Communication Mobility status        Time: 3810-1751 OT Time Calculation (min): 26 min  Charges: OT General Charges $OT Visit: 1 Visit OT Treatments $Self Care/Home Management : 8-22 mins  [facility] MS, OTR/L ascom [phone]  05/09/22, 1:55 PM

## 2022-05-09 NOTE — TOC Progression Note (Signed)
Transition of Care [facility]) - Progression Note    Patient Details  Name: [name] MRN: [id number] Date of Birth: 10-18-1950  Transition of Care [facility]) CM/SW [place], [place] Phone Number: 05/09/2022, 4:03 PM  Clinical Narrative:     TOC left a message for Ricky at [facility]. The patient is ready for discharge.  Expected Discharge Plan: [place]    Expected Discharge Plan and Services Expected Discharge Plan: [place]                                               Social Determinants of Health (SDOH) Interventions    Readmission Risk Interventions     No data to display

## 2022-05-09 NOTE — Progress Notes (Addendum)
Physical Therapy Treatment Patient Details Name: [name] MRN: [id number] DOB: 1951/05/08 Today's Date: 05/09/2022   History of Present Illness 71 year old female with history of hyperlipidemia, insulin-dependent diabetes mellitus, GERD, morbid obesity, mostly bedbound, presented to the ED on 04/29/2022 for evaluation of altered mental status, dysuria and concerns about her leg wounds and redness.  She also reported a productive cough for several months which she attributes to reflux.  Resident at ALF ([facility]) since a rehab stay at [facility] this past summer.  She reports strength and mobility have steadily declined over the last year and she's had several falls.  Now with UTI.    PT Comments    Co-tx with OT due to assist needed for mobility.  1 unit billed each per protocol.  Supine aarom to awaken pt and await OT arrival.  She is able to get to EOB with mod a x 2 and overall does put in good effort to assist today.  Once sitting she is steady with no LOB x 15 + minutes.  She is able to attempt standing x 3 but is unable to clear her hips off bed despite dependant/max a x 2.  She does seem to abandon attempts early before she establishes good quad/glut and upper body assist.  She is unable to lateral scoot despite max a x 2.  Participated in exercises as described below. She is able to return to supine and reposition in bed with max/dependant x 2.  She does make a good effort to lay down by herself but is unable to effectively bring her legs up in bed or reposition without max trendelenburg bed position.  She does try to pull up with her arm when positioned correctly.    Overall good session today with increased participation.  Continue to recommend +2 for mobility attempts due to assist needed for transitioning supine <-> sit.  SNF remains appropriate for discharge.  Recommend shoes for further standing trials.  They are not in her room and she is encouraged to ask family to bring them in here  or when discharged to rehab.  Feet slide with attempts and toes are tender so blocking is too painful.   Recommendations for follow up therapy are one component of a multi-disciplinary discharge planning process, led by the attending physician.  Recommendations may be updated based on patient status, additional functional criteria and insurance authorization.  Follow Up Recommendations  Skilled nursing-short term rehab (<3 hours/day)     Assistance Recommended at Discharge Frequent or constant Supervision/Assistance  Patient can return home with the following Two people to help with walking and/or transfers;Two people to help with bathing/dressing/bathroom;Assist for transportation;Help with stairs or ramp for entrance;Assistance with cooking/housework   Equipment Recommendations  None recommended by PT    Recommendations for Other Services       Precautions / Restrictions Precautions Precautions: Fall Precaution Comments: toes and legs tender Restrictions Weight Bearing Restrictions: No     Mobility  Bed Mobility Overal bed mobility: Needs Assistance Bed Mobility: Rolling Rolling: Max assist, +2 for physical assistance   Supine to sit: Mod assist, +2 for physical assistance, HOB elevated Sit to supine: Max assist, +2 for physical assistance   General bed mobility comments: good effort to get to EOB today but does need significant assist and HOB raised    Transfers Overall transfer level: Needs assistance Equipment used: Rolling walker (2 wheels) Transfers: Sit to/from Stand Sit to Stand: Total assist, +2 physical assistance  General transfer comment: unble to clear hips x 3 attemps    Ambulation/Gait               General Gait Details: unable to stand   Stairs             Wheelchair Mobility    Modified Rankin (Stroke Patients Only)       Balance Overall balance assessment: Needs assistance Sitting-balance support: Feet  supported Sitting balance-Leahy Scale: Good Sitting balance - Comments: steady in sitting with distant supervision and no LOB   Standing balance support: Bilateral upper extremity supported Standing balance-Leahy Scale: Zero                              Cognition Arousal/Alertness: Awake/alert Behavior During Therapy: WFL for tasks assessed/performed Overall Cognitive Status: Within Functional Limits for tasks assessed                                          Exercises Other Exercises Other Exercises: seated and supine AROM x 10    General Comments        Pertinent Vitals/Pain Pain Assessment Pain Assessment: Faces Faces Pain Scale: Hurts even more Pain Location: BLE to touch, toes and general discomfort from immobility Pain Descriptors / Indicators: Grimacing, Guarding, Discomfort Pain Intervention(s): Repositioned, Limited activity within patient's tolerance, Monitored during session    Home Living                          Prior Function            PT Goals (current goals can now be found in the care plan section) Progress towards PT goals: Progressing toward goals    Frequency    Min 2X/week      PT Plan Current plan remains appropriate    Co-evaluation PT/OT/SLP Co-Evaluation/Treatment: Yes Reason for Co-Treatment: For patient/therapist safety PT goals addressed during session: Mobility/safety with mobility;Balance OT goals addressed during session: ADL's and self-care;Strengthening/ROM      AM-PAC PT "6 Clicks" Mobility   Outcome Measure  Help needed turning from your back to your side while in a flat bed without using bedrails?: Total Help needed moving from lying on your back to sitting on the side of a flat bed without using bedrails?: A Lot Help needed moving to and from a bed to a chair (including a wheelchair)?: Total Help needed standing up from a chair using your arms (e.g., wheelchair or bedside  chair)?: Total Help needed to walk in hospital room?: Total Help needed climbing 3-5 steps with a railing? : Total 6 Click Score: 7    End of Session Equipment Utilized During Treatment: Oxygen Activity Tolerance: Patient limited by fatigue Patient left: in bed;with call bell/phone within reach;with bed alarm set Nurse Communication: Mobility status Pain - Right/Left: Right (both legs and R arm) Pain - part of body: Leg;Arm     Time: 4270-6237 PT Time Calculation (min) (ACUTE ONLY): 31 min  Charges:  $Therapeutic Activity: 8-22 mins                    Chesley Noon, PTA 05/09/22, 10:58 AM

## 2022-05-10 ENCOUNTER — Encounter: Payer: Self-pay | Admitting: Internal Medicine

## 2022-05-10 DIAGNOSIS — G9341 Metabolic encephalopathy: Secondary | ICD-10-CM | POA: Diagnosis not present

## 2022-05-10 LAB — BASIC METABOLIC PANEL
Anion gap: 5 (ref 5–15)
BUN: 38 mg/dL — ABNORMAL HIGH (ref 8–23)
CO2: 31 mmol/L (ref 22–32)
Calcium: 8.3 mg/dL — ABNORMAL LOW (ref 8.9–10.3)
Chloride: 105 mmol/L (ref 98–111)
Creatinine, Ser: 1.5 mg/dL — ABNORMAL HIGH (ref 0.44–1.00)
GFR, Estimated: 37 mL/min — ABNORMAL LOW (ref >60)
Glucose, Bld: 116 mg/dL — ABNORMAL HIGH (ref 70–99)
Potassium: 4.2 mmol/L (ref 3.5–5.1)
Sodium: 141 mmol/L (ref 135–145)

## 2022-05-10 LAB — CBC
HCT: 39.1 % (ref 36.0–46.0)
Hemoglobin: 11.6 g/dL — ABNORMAL LOW (ref 12.0–15.0)
MCH: 25.5 pg — ABNORMAL LOW (ref 26.0–34.0)
MCHC: 29.7 g/dL — ABNORMAL LOW (ref 30.0–36.0)
MCV: 85.9 fL (ref 80.0–100.0)
Platelets: 225 10*3/uL (ref 150–400)
RBC: 4.55 MIL/uL (ref 3.87–5.11)
RDW: 16.2 % — ABNORMAL HIGH (ref 11.5–15.5)
WBC: 6.5 10*3/uL (ref 4.0–10.5)
nRBC: 0 % (ref 0.0–0.2)

## 2022-05-10 LAB — GLUCOSE, CAPILLARY
Glucose-Capillary: 105 mg/dL — ABNORMAL HIGH (ref 70–99)
Glucose-Capillary: 171 mg/dL — ABNORMAL HIGH (ref 70–99)

## 2022-05-10 MED ORDER — LEVEMIR FLEXPEN 100 UNIT/ML ~~LOC~~ SOPN: 35.0000 [IU] | PEN_INJECTOR | Freq: Every day | SUBCUTANEOUS | 11 refills | Status: AC

## 2022-05-10 MED ORDER — DOCUSATE SODIUM 100 MG PO CAPS: 100.0000 mg | ORAL_CAPSULE | Freq: Two times a day (BID) | ORAL | 0 refills | Status: AC | PRN

## 2022-05-10 MED ORDER — FIDAXOMICIN 200 MG PO TABS: 200.0000 mg | ORAL_TABLET | Freq: Two times a day (BID) | ORAL | 0 refills | Status: AC

## 2022-05-10 MED ORDER — OXYCODONE HCL 5 MG PO TABS: 2.5000 mg | ORAL_TABLET | Freq: Three times a day (TID) | ORAL | 0 refills | Status: DC | PRN

## 2022-05-10 NOTE — TOC Transition Note (Signed)
Transition of Care [facility]) - CM/SW Discharge Note   Patient Details  Name: [name] MRN: [id number] Date of Birth: 05-May-1951  [facility] CM/SW Contact:  Colen Darling, [place] Phone Number: 05/10/2022, 12:02 PM   Clinical Narrative:     Patient will transfer to [facility] today.  Final next level of care: Skilled Nursing Facility Barriers to Discharge: Barriers Resolved   Patient Goals and CMS Choice Patient states their goals for this hospitalization and ongoing recovery are:: to attend rehab CMS Medicare.gov Compare Post Acute Care list provided to:: Patient Choice offered to / list presented to : Patient  Discharge Placement PASRR number recieved: 05/10/22 Existing PASRR number confirmed : 05/10/22          Patient chooses bed at:  [facility]) Patient to be transferred to facility by: [facility] Name of family member notified: Cleda Clarks: [phone] Patient and family notified of of transfer: 05/10/22  Discharge Plan and [facility]                Social Determinants of Health (SDOH) Interventions     Readmission Risk Interventions     No data to display

## 2022-05-10 NOTE — Care Management Important Message (Signed)
Important Message  Patient Details  Name: [name] MRN: [id number] Date of Birth: January 12, 1951   Medicare Important Message Given:  Other (see comment)  Patient is in an isolation room so I called [phone]) to review her Important Message from Medicare with her but there was no answer. Will try again.   Juliann Pulse A Avyay Coger 05/10/2022, 11:54 AM

## 2022-05-10 NOTE — Discharge Summary (Signed)
Physician Discharge Summary   Patient: [name] MRN: [id number] DOB: 28-Oct-1950  Admit date:     04/29/2022  Discharge date: 05/10/22  Discharge Physician: Ezekiel Slocumb   PCP: [facility]   Recommendations at discharge:    Follow up on resolution of C diff infection after completing treatment. She has 6 more days of Dificid. Follow up on glycemic control and insulin requirements. She is requiring much less insulin than regimen prior to admission, and was having hypoglycemic episodes early during admission. 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 difficile diarrhea   Generalized weakness   Chronic systolic CHF (congestive heart failure) (HCC)   Obstructive sleep apnea   Diabetes mellitus without complication (HCC)   Paroxysmal atrial fibrillation (HCC)   Morbid obesity (HCC)   CAD (coronary artery disease)   Stage 3b chronic kidney disease (CKD) (HCC)   GERD (gastroesophageal reflux disease)   Intertriginous candidiasis   Chronic respiratory failure with hypoxia (HCC)  Principal Problem (Resolved):   Acute metabolic encephalopathy Resolved Problems:   UTI (urinary tract infection)   Elevated troponin   Hypoglycemia  Hospital Course: [name] is a 71 year old female with history of hyperlipidemia, insulin-dependent diabetes mellitus, GERD, morbid obesity, mostly bedbound, cannot sit on edge of bed, presented to the ED on 04/29/2022 for evaluation of altered mental status, dysuria and concerns about her leg wounds and redness.  She also reported a productive cough for several months which she attributes to reflux.    Pt lives in [place] since a rehab stay at [facility] this past summer.  She reports strength and mobility have recently declined again, and she's had several falls.  PT/OT evaluations ordered.    Evaluation revealed likely UTI, and there is concern for cellulitis related to her b/l leg wounds.  Started on empiric Rocephin pending urine culture results.     Patient was later found to have diarrhea and tested positive for C diff. She was started on oral vancomycin but was not improving, so therapy was changed to Dificid. Appears diarrhea now improving.  11/15: Patient is medically stable for discharge to SNF/rehab today.     Assessment and Plan: * Acute metabolic encephalopathy-resolved as of 05/10/2022 Presented with altered mental status, likely infection etiology from UTI.  No focal neurologic deficits. 11/5: mental status improved and appears baseline - Treat infection as outlined -- Delirium & fall precautions -- PT/OT -- Monitor for metabolic derangements  Generalized weakness Due to chronic deconditioning, morbid obesity, and acute illness exacerbating baseline weakness.  Pt resides at ALF. --PT/OT following --SNF/rehab recommended --TOC following --Fall precautions  C. difficile diarrhea Pt noted to have fecal incontinence with loose / water stools.  C diff tested, showed positive antigen, but negative toxin.  Given symptomatic, will proceed with treatment. 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  Due to demand ischemia in setting of acute illness. No chest pain or acute ischemic EKG changes. Started on empiric heparin on admission. --Stop heparin --Resume Eliquis  Chronic systolic CHF (congestive heart failure) (HCC) Appears stable and overall euvolemic - no pedal or distal lower extremity edema. Echo from 08/27/21 -- EF 40-45%, indeterminate diastolic parameters, mild MR --Monitor volume status closely --Continue home Lasix 20 mg PO daily --Monitor renal function and electrolytes   Paroxysmal atrial fibrillation (HCC) HR controlled. Stop heparin drip. Resume Eliquis. Appears not to be on rate control agent.  Diabetes mellitus without complication ([place]) Pt had multiple hypoglycemic episodes earlier during admission --On Levemir to 35 units daily  (Home dose was 65 units) --Monitor sugars closely --Follow up PCP in 1-2 weeks  Obstructive sleep apnea CPAP ordered  Morbid obesity (HCC) Body mass index is 51.73 kg/m. Complicates overall care and prognosis.  Recommend lifestyle modifications including physical activity and diet for weight loss and overall long-term health.    CAD (coronary artery disease) - Atorvastatin 40 mg daily resumed  Chronic respiratory failure with hypoxia (HCC) Requiring 2 L/min oxygen during admission, appears baseline.  Intertriginous candidiasis Nystatin powder ordered  GERD (gastroesophageal reflux disease) Continue PPI  Stage 3b chronic kidney disease (CKD) ([place]) - At baseline  Hypoglycemia-resolved as of 05/10/2022 11/7 AM glucose was 54.  Improved after given juice. Lowered dose of Levemir as outlined. Monitor closely.   Hypoglycemia protocol.          Consultants: None Procedures performed: None  Disposition: Skilled nursing facility Diet recommendation:  Discharge Diet Orders (From admission, onward)     Start     Ordered   05/10/22 0000  Diet - low sodium heart healthy        05/10/22 1121           Cardiac and Carb modified diet DISCHARGE MEDICATION: Allergies as of 05/10/2022       Reactions   Tetanus Toxoid, Adsorbed Swelling   Tetanus Toxoids Swelling   Lisinopril Rash   Other reaction(s): Unknown   Niacin Rash   Other reaction(s): Unknown   Sitagliptin Rash   Other reaction(s): Unknown   Sulfamethoxazole-trimethoprim Rash   Other reaction(s): Unknown        Medication List     STOP taking these medications    cefdinir 300 MG capsule Commonly known as: OMNICEF   cephALEXin 500 MG capsule Commonly known as: KEFLEX   insulin glargine-yfgn 100 UNIT/ML injection Commonly known as: SEMGLEE   NovoLOG FlexPen 100 UNIT/ML FlexPen Generic drug: insulin aspart   ondansetron 4 MG tablet Commonly known as: ZOFRAN   polyethylene glycol 17 g packet Commonly known as: MIRALAX / GLYCOLAX       TAKE these medications    acetaminophen 325 MG tablet Commonly known as: TYLENOL Take 2 tablets (650 mg total) by mouth every 6 (six) hours as needed for mild pain (or Fever >/= 101). What changed:  how much to take when to take this additional instructions   apixaban 5 MG Tabs tablet Commonly known as: ELIQUIS Take  1 tablet (5 mg total) by mouth 2 (two) times daily.   atorvastatin 40 MG tablet Commonly known as: LIPITOR Take 1 tablet (40 mg total) by mouth daily at 6 PM.   barrier cream Crea Commonly known as: non-specified Apply 1 application. topically 3 (three) times daily. Every shift   Cholecalciferol 50 MCG (2000 UT) Caps Take 1 capsule (2,000 Units total) by mouth daily.   clonazePAM 0.5 MG tablet Commonly known as: KLONOPIN Take 0.5 mg by mouth 2  (two) times daily as needed.   docusate sodium 100 MG capsule Commonly known as: COLACE Take 1 capsule (100 mg total) by mouth 2 (two) times daily as needed for mild constipation. What changed:  when to take this reasons to take this   feeding supplement (PRO-STAT SUGAR FREE 64) Liqd Take 30 mLs by mouth 2 (two) times daily.   fenofibrate 145 MG tablet Commonly known as: TRICOR Take 1 tablet (145 mg total) by mouth daily.   fidaxomicin 200 MG Tabs tablet Commonly known as: DIFICID Take 1 tablet (200 mg total) by mouth 2 (two) times daily for 6 days.   furosemide 20 MG tablet Commonly known as: LASIX Take 20 mg by mouth daily.   L.E.T. 4-0.05-0.5 % Gel Generic drug: lido-EPINEPHrine-Tetracaine Apply 1 mL topically every hour as needed (leg ulcer pain).   Levemir FlexPen 100 UNIT/ML FlexPen Generic drug: insulin detemir Inject 35 Units into the skin daily. What changed: how much to take   multivitamin with minerals Tabs tablet Take 1 tablet by mouth daily.   Myrbetriq 50 MG Tb24 tablet Generic drug: mirabegron ER Take 50 mg by mouth daily.   nystatin powder Commonly known as: MYCOSTATIN/NYSTOP SMARTSIG:1 Application Topical 2-3 Times Daily   omeprazole 20 MG capsule Commonly known as: PRILOSEC Take 20 mg by mouth daily.   oxyCODONE 5 MG immediate release tablet Commonly known as: Oxy IR/ROXICODONE Take 0.5-1 tablets (2.5-5 mg total) by mouth every 8 (eight) hours as needed for moderate pain or severe pain.               Discharge Care Instructions  (From admission, onward)           Start     Ordered   05/10/22 0000  Discharge wound care:       Comments: Dressing procedure/placement/frequency: GENTLY wash BLE with soap and water. Pat dry. Apply Sween Moisturizing Ointment.  Place Xeroform gauzes Kellie Simmering [phone]) over each weeping area.   Beginning behind the toes and going to just below the knees, spiral wrap kerlix, then 4 or 6 inch ace wrap.   Perform daily.   05/10/22 1121            Contact information for after-discharge care     Destination     [facility] .   Service: Skilled Nursing Contact information: [address] [place] [phone]                    Discharge Exam: Danley Danker Weights   04/29/22 1131  Weight: (!) 141 kg   General exam: awake, appears drowsy, no acute distress, morbidly obese HEENT: wearing glasses, nasal cannula in place, moist mucus membranes, hearing grossly normal  Respiratory system: CTAB diminished due to body habitus, no wheezes, normal respiratory effort at rest on 2 L/min [place] O2. Cardiovascular system: normal S1/S2, RRR, no pedal edema.   Gastrointestinal system: soft, NT, ND, +bowel sounds. Central nervous system: A&O  x3. no gross focal neurologic deficits, normal speech Extremities: bilateral LE's wrapped, normal tone Skin: dry, intact, normal temperature Psychiatry: normal mood, congruent affect, judgement and insight appear normal   Condition at discharge: stable  The results of significant diagnostics from this hospitalization (including imaging, microbiology, ancillary and laboratory) are listed below for reference.   Imaging Studies: CT Head Wo Contrast  Result Date: 04/29/2022 CLINICAL DATA:  Found sitting on the floor. Weakness. EXAM: CT HEAD WITHOUT CONTRAST CT CERVICAL SPINE WITHOUT CONTRAST TECHNIQUE: Multidetector CT imaging of the head and cervical spine was performed following the standard protocol without intravenous contrast. Multiplanar CT image reconstructions of the cervical spine were also generated. RADIATION DOSE REDUCTION: This exam was performed according to the departmental dose-optimization program which includes automated exposure control, adjustment of the mA and/or kV according to patient size and/or use of iterative reconstruction technique. COMPARISON:  Head and cervical spine CT dated  04/28/2022. FINDINGS: CT HEAD FINDINGS Brain: Stable mildly enlarged ventricles and cortical sulci. Stable minimal patchy white matter low density in both cerebral hemispheres. No intracranial hemorrhage, mass lesion or CT evidence of acute infarction. Vascular: No hyperdense vessel or unexpected calcification. Skull: Normal. Negative for fracture or focal lesion. Sinuses/Orbits: Unremarkable. Other: Deviation of the midportion of the nasal septum to the right. CT CERVICAL SPINE FINDINGS Alignment: Straightening of the normal cervical lordosis with mild improvement. No subluxations. Skull base and vertebrae: No acute fracture. No primary bone lesion or focal pathologic process. Soft tissues and spinal canal: No prevertebral fluid or swelling. No visible canal hematoma. Disc levels:  Previously noted degenerative changes. Upper chest: Clear lung apices. Other: Previously noted 1.8 cm mass in the left parotid gland. This contains a small amount calcification IMPRESSION: 1. No skull fracture or intracranial hemorrhage. 2. No cervical spine fracture or subluxation. 3. 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 Differential considerations and ENT consultation as previously described. 4. Stable mild diffuse cerebral and cerebellar atrophy and minimal chronic small vessel white matter ischemic changes in both cerebral hemispheres. 5. Previously noted degenerative changes of the cervical spine. Electronically Signed   By: Claudie Revering M.D.   On: 04/29/2022 12:55   CT Cervical Spine Wo Contrast  Result Date: 04/29/2022 CLINICAL DATA:  Found sitting on the floor. Weakness. EXAM: CT HEAD WITHOUT CONTRAST CT CERVICAL SPINE WITHOUT CONTRAST TECHNIQUE: Multidetector CT imaging of the head and cervical spine was performed following the standard protocol without intravenous contrast. Multiplanar CT image reconstructions of the cervical spine were also generated. RADIATION DOSE REDUCTION: This exam was performed  according to the departmental dose-optimization program which includes automated exposure control, adjustment of the mA and/or kV according to patient size and/or use of iterative reconstruction technique. COMPARISON:  Head and cervical spine CT dated 04/28/2022. FINDINGS: CT HEAD FINDINGS Brain: Stable mildly enlarged ventricles and cortical sulci. Stable minimal patchy white matter low density in both cerebral hemispheres. No intracranial hemorrhage, mass lesion or CT evidence of acute infarction. Vascular: No hyperdense vessel or unexpected calcification. Skull: Normal. Negative for fracture or focal lesion. Sinuses/Orbits: Unremarkable. Other: Deviation of the midportion of the nasal septum to the right. CT CERVICAL SPINE FINDINGS Alignment: Straightening of the normal cervical lordosis with mild improvement. No subluxations. Skull base and vertebrae: No acute fracture. No primary bone lesion or focal pathologic process. Soft tissues and spinal canal: No prevertebral fluid or swelling. No visible canal hematoma. Disc levels:  Previously noted degenerative changes. Upper chest: Clear lung apices. Other: Previously  noted 1.8 cm mass in the left parotid gland. This contains a small amount calcification IMPRESSION: 1. No skull fracture or intracranial hemorrhage. 2. No cervical spine fracture or subluxation. 3. 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 Differential considerations and ENT consultation as previously described. 4. Stable mild diffuse cerebral and cerebellar atrophy and minimal chronic small vessel white matter ischemic changes in both cerebral hemispheres. 5. Previously noted degenerative changes of the cervical spine. Electronically Signed   By: Claudie Revering M.D.   On: 04/29/2022 12:55   DG Chest Portable 1 View  Result Date: 04/29/2022 CLINICAL DATA:  Weakness.  Altered mental status. EXAM: PORTABLE CHEST 1 VIEW COMPARISON:  04/28/2022 FINDINGS: The heart size and mediastinal  contours are within normal limits. Pacemaker remains in appropriate position. Prior TAVR again noted. Low lung volumes noted. Both lungs are clear. The visualized skeletal structures are unremarkable. IMPRESSION: Low lung volumes.  No active disease. Electronically Signed   By: Marlaine Hind M.D.   On: 04/29/2022 12:27   CT Head Wo Contrast  Result Date: 04/28/2022 CLINICAL DATA:  Provided history: Neck trauma. Head trauma, minor. Additional history provided: Fall, unwitnessed, on blood thinners. EXAM: CT HEAD WITHOUT CONTRAST CT CERVICAL SPINE WITHOUT CONTRAST TECHNIQUE: Multidetector CT imaging of the head and cervical spine was performed following the standard protocol without intravenous contrast. Multiplanar CT image reconstructions of the cervical spine were also generated. RADIATION DOSE REDUCTION: This exam was performed according to the departmental dose-optimization program which includes automated exposure control, adjustment of the mA and/or kV according to patient size and/or use of iterative reconstruction technique. COMPARISON:  Head CT 08/31/2021. FINDINGS: CT HEAD FINDINGS Brain: Mild generalized cerebral atrophy. Minimal patchy and ill-defined hypoattenuation within the cerebral white matter, nonspecific but compatible with chronic small vessel ischemic disease. Partially empty sella turcica. There is no acute intracranial hemorrhage. No demarcated cortical infarct. No extra-axial fluid collection. No evidence of an intracranial mass. No midline shift. Vascular: No hyperdense vessel. Atherosclerotic calcifications. Skull: No fracture or aggressive osseous lesion. Sinuses/Orbits: No mass or acute finding within the imaged orbits. Minimal mucosal thickening within the bilateral ethmoid, right sphenoid and bilateral maxillary sinuses. Other: 18 x 14 mm ovoid lesion within the left parotid gland (for instance as seen on series 2, image 6) (series 5, image 57). This may reflect an abnormal, enlarged  lymph node or primary parotid neoplasm. CT CERVICAL SPINE FINDINGS Alignment: Straightening of the expected cervical lordosis. No significant spondylolisthesis. Skull base and vertebrae: The basion-dental and atlanto-dental intervals are maintained.No evidence of acute fracture to the cervical spine. Facet joint ankylosis bilaterally at C2-C3 Soft tissues and spinal canal: No prevertebral fluid or swelling. No visible canal hematoma. Disc levels: Cervical spondylosis with multilevel disc space narrowing, disc bulges/central disc protrusions, posterior disc osteophyte complexes, uncovertebral hypertrophy and facet arthrosis. Disc space narrowing is greatest at C5-C6 and C6-C7 (advanced at these levels). No appreciable high-grade spinal canal stenosis. Multilevel bony neural foraminal narrowing. Upper chest: No consolidation within the imaged lung apices. No visible pneumothorax. IMPRESSION: CT head: 1. No evidence of acute intracranial abnormality. 2. Minimal chronic small vessel ischemic changes within the cerebral white matter. 3. Mild generalized cerebral atrophy. 4. Minimal paranasal sinus mucosal thickening. 5. 18 x 14 mm ovoid lesion within the left parotid gland. This may reflect an abnormal, enlarged lymph node or primary parotid neoplasm. ENT consultation is recommended. CT cervical spine: 1. No evidence of acute fracture to the cervical spine. 2. Nonspecific straightening  of the expected cervical lordosis. 3. Cervical spondylosis, as described. 4. Facet joint ankylosis bilaterally at C2-C3. Electronically Signed   By: Kellie Simmering D.O.   On: 04/28/2022 15:31   CT Cervical Spine Wo Contrast  Result Date: 04/28/2022 CLINICAL DATA:  Provided history: Neck trauma. Head trauma, minor. Additional history provided: Fall, unwitnessed, on blood thinners. EXAM: CT HEAD WITHOUT CONTRAST CT CERVICAL SPINE WITHOUT CONTRAST TECHNIQUE: Multidetector CT imaging of the head and cervical spine was performed following the  standard protocol without intravenous contrast. Multiplanar CT image reconstructions of the cervical spine were also generated. RADIATION DOSE REDUCTION: This exam was performed according to the departmental dose-optimization program which includes automated exposure control, adjustment of the mA and/or kV according to patient size and/or use of iterative reconstruction technique. COMPARISON:  Head CT 08/31/2021. FINDINGS: CT HEAD FINDINGS Brain: Mild generalized cerebral atrophy. Minimal patchy and ill-defined hypoattenuation within the cerebral white matter, nonspecific but compatible with chronic small vessel ischemic disease. Partially empty sella turcica. There is no acute intracranial hemorrhage. No demarcated cortical infarct. No extra-axial fluid collection. No evidence of an intracranial mass. No midline shift. Vascular: No hyperdense vessel. Atherosclerotic calcifications. Skull: No fracture or aggressive osseous lesion. Sinuses/Orbits: No mass or acute finding within the imaged orbits. Minimal mucosal thickening within the bilateral ethmoid, right sphenoid and bilateral maxillary sinuses. Other: 18 x 14 mm ovoid lesion within the left parotid gland (for instance as seen on series 2, image 6) (series 5, image 57). This may reflect an abnormal, enlarged lymph node or primary parotid neoplasm. CT CERVICAL SPINE FINDINGS Alignment: Straightening of the expected cervical lordosis. No significant spondylolisthesis. Skull base and vertebrae: The basion-dental and atlanto-dental intervals are maintained.No evidence of acute fracture to the cervical spine. Facet joint ankylosis bilaterally at C2-C3 Soft tissues and spinal canal: No prevertebral fluid or swelling. No visible canal hematoma. Disc levels: Cervical spondylosis with multilevel disc space narrowing, disc bulges/central disc protrusions, posterior disc osteophyte complexes, uncovertebral hypertrophy and facet arthrosis. Disc space narrowing is greatest  at C5-C6 and C6-C7 (advanced at these levels). No appreciable high-grade spinal canal stenosis. Multilevel bony neural foraminal narrowing. Upper chest: No consolidation within the imaged lung apices. No visible pneumothorax. IMPRESSION: CT head: 1. No evidence of acute intracranial abnormality. 2. Minimal chronic small vessel ischemic changes within the cerebral white matter. 3. Mild generalized cerebral atrophy. 4. Minimal paranasal sinus mucosal thickening. 5. 18 x 14 mm ovoid lesion within the left parotid gland. This may reflect an abnormal, enlarged lymph node or primary parotid neoplasm. ENT consultation is recommended. CT cervical spine: 1. No evidence of acute fracture to the cervical spine. 2. Nonspecific straightening of the expected cervical lordosis. 3. Cervical spondylosis, as described. 4. Facet joint ankylosis bilaterally at C2-C3. Electronically Signed   By: Kellie Simmering D.O.   On: 04/28/2022 15:31   DG Chest Portable 1 View  Result Date: 04/28/2022 CLINICAL DATA:  Trauma, fall EXAM: PORTABLE CHEST 1 VIEW COMPARISON:  04/06/2022 FINDINGS: Transverse diameter of heart is increased. There is prosthetic stent in aortic valve. Central pulmonary vessels are prominent. There is slight prominence of interstitial markings in parahilar regions and lower lung fields. There is no focal consolidation. There is no significant pleural effusion or pneumothorax. IMPRESSION: Cardiomegaly. Central pulmonary vessels are prominent suggesting CHF. There is increase in interstitial markings in parahilar regions and lower lung fields suggesting mild interstitial edema or interstitial pneumonia or scarring. Electronically Signed   By: Prudy Feeler.D.  On: 04/28/2022 15:02   DG Pelvis 1-2 Views  Result Date: 04/28/2022 CLINICAL DATA:  Trauma, fall EXAM: PELVIS - 1-2 VIEW COMPARISON:  CT done on 08/25/2021 FINDINGS: Image is technically less than optimal related to patient's body habitus. No displaced  fracture or dislocation is seen. Degenerative changes are noted in visualized lower lumbar spine. IMPRESSION: No displaced fracture or dislocation is seen in the limited AP view of pelvis. Electronically Signed   By: Elmer Picker M.D.   On: 04/28/2022 15:00    Microbiology: Results for orders placed or performed during the hospital encounter of 04/29/22  Resp Panel by RT-PCR (Flu A&B, Covid) Anterior Nasal Swab     Status: None   Collection Time: 04/29/22  2:01 PM   Specimen: Anterior Nasal Swab  Result Value Ref Range Status   SARS Coronavirus 2 by RT PCR NEGATIVE NEGATIVE Final    Comment: (NOTE) SARS-CoV-2 target nucleic acids are NOT DETECTED.  The SARS-CoV-2 RNA is generally detectable in upper respiratory specimens during the acute phase of infection. The lowest concentration of SARS-CoV-2 viral copies this assay can detect is 138 copies/mL. A negative result does not preclude SARS-Cov-2 infection and should not be used as the sole basis for treatment or other patient management decisions. A negative result may occur with  improper specimen collection/handling, submission of specimen other than nasopharyngeal swab, presence of viral mutation(s) within the areas targeted by this assay, and inadequate number of viral copies(<138 copies/mL). A negative result must be combined with clinical observations, patient history, and epidemiological information. The expected result is Negative.  Fact Sheet for Patients:  EntrepreneurPulse.com.au  Fact Sheet for Healthcare Providers:  IncredibleEmployment.be  This test is no t yet approved or cleared by the [place] FDA and  has been authorized for detection and/or diagnosis of SARS-CoV-2 by FDA under an Emergency Use Authorization (EUA). This EUA will remain  in effect (meaning this test can be used) for the duration of the COVID-19 declaration under Section 564(b)(1) of the Act,  21 U.S.C.section 360bbb-3(b)(1), unless the authorization is terminated  or revoked sooner.       Influenza A by PCR NEGATIVE NEGATIVE Final   Influenza B by PCR NEGATIVE NEGATIVE Final    Comment: (NOTE) The Xpert Xpress SARS-CoV-2/FLU/RSV plus assay is intended as an aid in the diagnosis of influenza from Nasopharyngeal swab specimens and should not be used as a sole basis for treatment. Nasal washings and aspirates are unacceptable for Xpert Xpress SARS-CoV-2/FLU/RSV testing.  Fact Sheet for Patients: EntrepreneurPulse.com.au  Fact Sheet for Healthcare Providers: IncredibleEmployment.be  This test is not yet approved or cleared by the [place] FDA and has been authorized for detection and/or diagnosis of SARS-CoV-2 by FDA under an Emergency Use Authorization (EUA). This EUA will remain in effect (meaning this test can be used) for the duration of the COVID-19 declaration under Section 564(b)(1) of the Act, 21 U.S.C. section 360bbb-3(b)(1), unless the authorization is terminated or revoked.  Performed at [facility], [address] [place], [place] [address]   C Difficile Quick Screen w PCR reflex     Status: Abnormal   Collection Time: 04/30/22 12:07 PM   Specimen: STOOL  Result Value Ref Range Status   C Diff antigen POSITIVE (A) NEGATIVE Final   C Diff toxin NEGATIVE NEGATIVE Final   C Diff interpretation Results are indeterminate. See PCR results.  Final    Comment: Performed at [facility], [address] [place], [place]  [address]  C. Diff by PCR, Reflexed     Status: Abnormal   Collection Time: 04/30/22 12:07 PM  Result Value Ref Range Status   Toxigenic C. Difficile by PCR POSITIVE (A) NEGATIVE Final    Comment: Positive for toxigenic C. difficile with little to no toxin production. Only treat if clinical presentation suggests symptomatic illness. Performed at [facility], [address] [place], [place] [address]   Culture, blood (Routine X 2) w Reflex to ID Panel     Status: None   Collection Time: 05/01/22  3:54 AM   Specimen: BLOOD LEFT HAND  Result Value Ref Range Status   Specimen Description BLOOD LEFT HAND  Final   Special Requests   Final    BOTTLES DRAWN AEROBIC AND ANAEROBIC Blood Culture adequate volume   Culture   Final    NO GROWTH 5 DAYS Performed at [facility], [address] [place], [place] [address]    Report Status 05/06/2022 FINAL  Final  Culture, blood (Routine X 2) w Reflex to ID Panel     Status: None   Collection Time: 05/01/22  4:01 AM   Specimen: BLOOD LEFT HAND  Result Value Ref Range Status   Specimen Description BLOOD LEFT HAND  Final   Special Requests   Final    BOTTLES DRAWN AEROBIC AND ANAEROBIC Blood Culture adequate volume   Culture   Final    NO GROWTH 5 DAYS Performed at [facility], [address] [place], [place] [address]    Report Status 05/06/2022 FINAL  Final  Urine Culture     Status: Abnormal   Collection Time: 05/02/22 11:30 PM   Specimen: Urine, Clean Catch  Result Value Ref Range Status   Specimen Description   Final    URINE, CLEAN CATCH Performed at [facility], [address] [place], [place] [address]    Special Requests   Final    NONE Performed at [facility], [address] [place], [place] [address]    Culture 10,000 COLONIES/mL ENTEROCOCCUS FAECALIS (A)  Final   Report Status 05/05/2022 FINAL  Final   Organism ID, Bacteria ENTEROCOCCUS FAECALIS (A)  Final      Susceptibility   Enterococcus faecalis - MIC*    AMPICILLIN <=2 SENSITIVE Sensitive     NITROFURANTOIN <=16 SENSITIVE Sensitive     VANCOMYCIN 2 SENSITIVE Sensitive     * 10,000 COLONIES/mL ENTEROCOCCUS FAECALIS    Labs: CBC: Recent Labs  Lab 05/05/22 0333 05/10/22 0433  WBC 8.4 6.5  HGB 11.8* 11.6*  HCT 39.8 39.1  MCV 86.3 85.9  PLT 199 067   Basic Metabolic Panel: Recent  Labs  Lab 05/06/22 1133 05/10/22 0433  NA 139 141  K 4.5 4.2  CL 103 105  CO2 30 31  GLUCOSE 165* 116*  BUN 39* 38*  CREATININE 1.65* 1.50*  CALCIUM 8.4* 8.3*  MG 2.1  --   PHOS 3.4  --    Liver Function Tests: No results for input(s): "AST", "ALT", "ALKPHOS", "BILITOT", "PROT", "ALBUMIN" in the last 168 hours. CBG: Recent Labs  Lab 05/09/22 0757 05/09/22 1143 05/09/22 1652 05/09/22 2150 05/10/22 0734  GLUCAP 112* 175* 150* 190* 105*    Discharge time spent: less than 30 minutes.  Signed: Ezekiel Slocumb, DO [facility] 05/10/2022

## 2022-05-10 NOTE — TOC Progression Note (Signed)
Transition of Care [facility]) - Progression Note    Patient Details  Name: [name] MRN: [id number] Date of Birth: [birth year]  Transition of Care [facility]) CM/SW [place], [place] Phone Number: 05/10/2022, 9:57 AM  Clinical Narrative:     TOC spoke to [place] at [facility]. The patient will admit to bed E6. The discharge summary needs to be sent to [facility] by 4:00 pm.  Expected Discharge Plan: [place]    Expected Discharge Plan and Services Expected Discharge Plan: [facility]                     Social Determinants of Health (SDOH) Interventions    Readmission Risk Interventions     No data to display

## 2022-05-10 NOTE — Care Management Important Message (Signed)
Important Message  Patient Details  Name: [name] MRN: [id number] Date of Birth: 1951/04/29   Medicare Important Message Given:  Yes  I reviewed the Important Message from Medicare with her by phone [phone]). She is in agreement with her discharge. I thanked her for her time and wished her a speedy recovery.   Juliann Pulse A Torrance Stockley 05/10/2022, 12:29 PM

## 2022-05-10 NOTE — Progress Notes (Signed)
   05/10/22 1142  Medical Necessity for Transport Certificate --- IF THIS TRANSPORT IS ROUND TRIP OR SCHEDULED AND REPEATED, A PHYSICIAN MUST COMPLETE THIS FORM  Transport from: Teacher, English as a foreign language) Doctor, hospital to Teacher, English as a foreign language) Other (Comment) ([facility] room E6)  Did the patient arrive from a [place], [place] or Group Home? Yes  Care Facility Name Other (enter name of facility below)  [place] Name [place]  Is this the closest appropriate facility? Yes  Date of Transport Service 04/28/22  Name of [facility]  Round Trip Transport? No  Reason for Transport Discharge  Is this a hospice patient? 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 1. Patient unable to get up from bed without assistance  AND  2. Unable to ambulate  AND  3. Unable to sit in a chair, including wheelchair. Yes  Q2 Could the patient be transported safely by other means of transportation (I.E., wheelchair van)? No  Q3 Please check any of the following conditions that apply at the time of transport: Obesity  Electronic Signature Colen Darling  Credentials DP  Date Signed 05/10/22

## 2022-05-10 NOTE — Assessment & Plan Note (Signed)
Requiring 2 L/min oxygen during admission, appears baseline.

## 2022-10-14 ENCOUNTER — Emergency Department: Payer: Medicare Other

## 2022-10-14 ENCOUNTER — Inpatient Hospital Stay
Admission: EM | Admit: 2022-10-14 | Discharge: 2022-10-18 | DRG: 445 | Disposition: A | Discharge status: Skilled Nursing Facility | Payer: Medicare Other | Source: Skilled Nursing Facility | Attending: Internal Medicine | Admitting: Internal Medicine

## 2022-10-14 ENCOUNTER — Other Ambulatory Visit: Payer: Self-pay

## 2022-10-14 ENCOUNTER — Encounter: Payer: Self-pay | Admitting: Emergency Medicine

## 2022-10-14 DIAGNOSIS — Z789 Other specified health status: Secondary | ICD-10-CM | POA: Diagnosis present

## 2022-10-14 DIAGNOSIS — Z79899 Other long term (current) drug therapy: Secondary | ICD-10-CM

## 2022-10-14 DIAGNOSIS — N1832 Chronic kidney disease, stage 3b: Secondary | ICD-10-CM | POA: Diagnosis present

## 2022-10-14 DIAGNOSIS — I350 Nonrheumatic aortic (valve) stenosis: Secondary | ICD-10-CM

## 2022-10-14 DIAGNOSIS — Z882 Allergy status to sulfonamides status: Secondary | ICD-10-CM

## 2022-10-14 DIAGNOSIS — I872 Venous insufficiency (chronic) (peripheral): Secondary | ICD-10-CM | POA: Diagnosis present

## 2022-10-14 DIAGNOSIS — I130 Hypertensive heart and chronic kidney disease with heart failure and stage 1 through stage 4 chronic kidney disease, or unspecified chronic kidney disease: Secondary | ICD-10-CM | POA: Diagnosis present

## 2022-10-14 DIAGNOSIS — Z952 Presence of prosthetic heart valve: Secondary | ICD-10-CM

## 2022-10-14 DIAGNOSIS — K810 Acute cholecystitis: Secondary | ICD-10-CM | POA: Diagnosis present

## 2022-10-14 DIAGNOSIS — K219 Gastro-esophageal reflux disease without esophagitis: Secondary | ICD-10-CM | POA: Diagnosis present

## 2022-10-14 DIAGNOSIS — E119 Type 2 diabetes mellitus without complications: Secondary | ICD-10-CM

## 2022-10-14 DIAGNOSIS — I5022 Chronic systolic (congestive) heart failure: Secondary | ICD-10-CM | POA: Diagnosis present

## 2022-10-14 DIAGNOSIS — E1122 Type 2 diabetes mellitus with diabetic chronic kidney disease: Secondary | ICD-10-CM | POA: Diagnosis present

## 2022-10-14 DIAGNOSIS — Z87442 Personal history of urinary calculi: Secondary | ICD-10-CM | POA: Diagnosis not present

## 2022-10-14 DIAGNOSIS — I2510 Atherosclerotic heart disease of native coronary artery without angina pectoris: Secondary | ICD-10-CM | POA: Diagnosis present

## 2022-10-14 DIAGNOSIS — I5042 Chronic combined systolic (congestive) and diastolic (congestive) heart failure: Secondary | ICD-10-CM | POA: Diagnosis present

## 2022-10-14 DIAGNOSIS — I480 Paroxysmal atrial fibrillation: Secondary | ICD-10-CM | POA: Diagnosis not present

## 2022-10-14 DIAGNOSIS — Z6841 Body Mass Index (BMI) 40.0 and over, adult: Secondary | ICD-10-CM

## 2022-10-14 DIAGNOSIS — I4819 Other persistent atrial fibrillation: Secondary | ICD-10-CM | POA: Diagnosis present

## 2022-10-14 DIAGNOSIS — F419 Anxiety disorder, unspecified: Secondary | ICD-10-CM | POA: Diagnosis present

## 2022-10-14 DIAGNOSIS — Z8249 Family history of ischemic heart disease and other diseases of the circulatory system: Secondary | ICD-10-CM

## 2022-10-14 DIAGNOSIS — A4902 Methicillin resistant Staphylococcus aureus infection, unspecified site: Secondary | ICD-10-CM | POA: Diagnosis present

## 2022-10-14 DIAGNOSIS — Z7901 Long term (current) use of anticoagulants: Secondary | ICD-10-CM | POA: Diagnosis not present

## 2022-10-14 DIAGNOSIS — N3281 Overactive bladder: Secondary | ICD-10-CM | POA: Diagnosis present

## 2022-10-14 DIAGNOSIS — K8000 Calculus of gallbladder with acute cholecystitis without obstruction: Principal | ICD-10-CM | POA: Diagnosis present

## 2022-10-14 DIAGNOSIS — Z950 Presence of cardiac pacemaker: Secondary | ICD-10-CM

## 2022-10-14 DIAGNOSIS — I10 Essential (primary) hypertension: Secondary | ICD-10-CM | POA: Diagnosis present

## 2022-10-14 DIAGNOSIS — Z794 Long term (current) use of insulin: Secondary | ICD-10-CM

## 2022-10-14 DIAGNOSIS — G4733 Obstructive sleep apnea (adult) (pediatric): Secondary | ICD-10-CM | POA: Diagnosis present

## 2022-10-14 DIAGNOSIS — E785 Hyperlipidemia, unspecified: Secondary | ICD-10-CM | POA: Diagnosis present

## 2022-10-14 DIAGNOSIS — J9611 Chronic respiratory failure with hypoxia: Secondary | ICD-10-CM | POA: Diagnosis present

## 2022-10-14 DIAGNOSIS — K819 Cholecystitis, unspecified: Principal | ICD-10-CM

## 2022-10-14 DIAGNOSIS — I252 Old myocardial infarction: Secondary | ICD-10-CM

## 2022-10-14 DIAGNOSIS — Z888 Allergy status to other drugs, medicaments and biological substances status: Secondary | ICD-10-CM

## 2022-10-14 LAB — COMPREHENSIVE METABOLIC PANEL
ALT: 16 U/L (ref 0–44)
AST: 20 U/L (ref 15–41)
Albumin: 3.5 g/dL (ref 3.5–5.0)
Alkaline Phosphatase: 57 U/L (ref 38–126)
Anion gap: 9 (ref 5–15)
BUN: 42 mg/dL — ABNORMAL HIGH (ref 8–23)
CO2: 27 mmol/L (ref 22–32)
Calcium: 9.1 mg/dL (ref 8.9–10.3)
Chloride: 104 mmol/L (ref 98–111)
Creatinine, Ser: 1.31 mg/dL — ABNORMAL HIGH (ref 0.44–1.00)
GFR, Estimated: 44 mL/min — ABNORMAL LOW (ref >60)
Glucose, Bld: 190 mg/dL — ABNORMAL HIGH (ref 70–99)
Potassium: 3.9 mmol/L (ref 3.5–5.1)
Sodium: 140 mmol/L (ref 135–145)
Total Bilirubin: 1.1 mg/dL (ref 0.3–1.2)
Total Protein: 6.9 g/dL (ref 6.5–8.1)

## 2022-10-14 LAB — CBC
HCT: 44.5 % (ref 36.0–46.0)
Hemoglobin: 14.7 g/dL (ref 12.0–15.0)
MCH: 29.6 pg (ref 26.0–34.0)
MCHC: 33 g/dL (ref 30.0–36.0)
MCV: 89.5 fL (ref 80.0–100.0)
Platelets: 162 10*3/uL (ref 150–400)
RBC: 4.97 MIL/uL (ref 3.87–5.11)
RDW: 12.7 % (ref 11.5–15.5)
WBC: 6.8 10*3/uL (ref 4.0–10.5)
nRBC: 0 % (ref 0.0–0.2)

## 2022-10-14 LAB — LIPASE, BLOOD: Lipase: 27 U/L (ref 11–51)

## 2022-10-14 LAB — GLUCOSE, CAPILLARY: Glucose-Capillary: 165 mg/dL — ABNORMAL HIGH (ref 70–99)

## 2022-10-14 MED ORDER — LACTATED RINGERS IV SOLN: INTRAVENOUS | Status: AC

## 2022-10-14 MED ORDER — SODIUM CHLORIDE 0.9 % IV SOLN
1.0000 g | Freq: Once | INTRAVENOUS | Status: AC
  Administered 2022-10-14: 1 g via INTRAVENOUS
  Filled 2022-10-14: qty 1

## 2022-10-14 MED ORDER — METRONIDAZOLE 500 MG/100ML IV SOLN
500.0000 mg | Freq: Two times a day (BID) | INTRAVENOUS | Status: DC
  Administered 2022-10-15 – 2022-10-17 (×5): 500 mg via INTRAVENOUS
  Filled 2022-10-14 (×5): qty 100

## 2022-10-14 MED ORDER — POLYETHYLENE GLYCOL 3350 17 G PO PACK: 17.0000 g | PACK | Freq: Every day | ORAL | Status: DC | PRN

## 2022-10-14 MED ORDER — INSULIN ASPART 100 UNIT/ML IJ SOLN: 0.0000 [IU] | Freq: Every day | INTRAMUSCULAR | Status: DC

## 2022-10-14 MED ORDER — INSULIN ASPART 100 UNIT/ML IJ SOLN
0.0000 [IU] | Freq: Three times a day (TID) | INTRAMUSCULAR | Status: DC
  Administered 2022-10-16: 3 [IU] via SUBCUTANEOUS
  Administered 2022-10-17 (×2): 2 [IU] via SUBCUTANEOUS
  Administered 2022-10-17: 3 [IU] via SUBCUTANEOUS
  Administered 2022-10-18: 2 [IU] via SUBCUTANEOUS
  Filled 2022-10-14 (×5): qty 1

## 2022-10-14 MED ORDER — SODIUM CHLORIDE 0.9% FLUSH
3.0000 mL | Freq: Two times a day (BID) | INTRAVENOUS | Status: DC
  Administered 2022-10-14 – 2022-10-18 (×8): 3 mL via INTRAVENOUS

## 2022-10-14 MED ORDER — ACETAMINOPHEN 650 MG RE SUPP: 650.0000 mg | Freq: Four times a day (QID) | RECTAL | Status: DC | PRN

## 2022-10-14 MED ORDER — SODIUM CHLORIDE 0.9 % IV SOLN
1.0000 g | Freq: Once | INTRAVENOUS | Status: AC
  Administered 2022-10-14: 1 g via INTRAVENOUS
  Filled 2022-10-14: qty 10

## 2022-10-14 MED ORDER — SODIUM CHLORIDE 0.9 % IV SOLN
2.0000 g | INTRAVENOUS | Status: DC
  Administered 2022-10-15 – 2022-10-16 (×2): 2 g via INTRAVENOUS
  Filled 2022-10-14: qty 2
  Filled 2022-10-14: qty 20

## 2022-10-14 MED ORDER — APIXABAN 5 MG PO TABS
5.0000 mg | ORAL_TABLET | Freq: Two times a day (BID) | ORAL | Status: DC
  Administered 2022-10-14: 5 mg via ORAL
  Filled 2022-10-14: qty 1

## 2022-10-14 MED ORDER — ATORVASTATIN CALCIUM 20 MG PO TABS
40.0000 mg | ORAL_TABLET | Freq: Every day | ORAL | Status: DC
  Administered 2022-10-15 – 2022-10-17 (×3): 40 mg via ORAL
  Filled 2022-10-14 (×3): qty 2

## 2022-10-14 MED ORDER — SODIUM CHLORIDE 0.9 % IV SOLN: 1.0000 g | INTRAVENOUS | Status: DC

## 2022-10-14 MED ORDER — CLONAZEPAM 0.5 MG PO TABS: 0.5000 mg | ORAL_TABLET | Freq: Two times a day (BID) | ORAL | Status: DC | PRN

## 2022-10-14 MED ORDER — ONDANSETRON HCL 4 MG/2ML IJ SOLN
4.0000 mg | Freq: Once | INTRAMUSCULAR | Status: AC | PRN
  Administered 2022-10-14: 4 mg via INTRAVENOUS
  Filled 2022-10-14: qty 2

## 2022-10-14 MED ORDER — INSULIN DETEMIR 100 UNIT/ML ~~LOC~~ SOLN
18.0000 [IU] | Freq: Every day | SUBCUTANEOUS | Status: DC
  Administered 2022-10-16 – 2022-10-18 (×3): 18 [IU] via SUBCUTANEOUS
  Filled 2022-10-14 (×4): qty 0.18

## 2022-10-14 MED ORDER — TRAZODONE HCL 50 MG PO TABS
50.0000 mg | ORAL_TABLET | Freq: Every evening | ORAL | Status: DC | PRN
  Administered 2022-10-14 – 2022-10-15 (×2): 50 mg via ORAL
  Filled 2022-10-14 (×2): qty 1

## 2022-10-14 MED ORDER — ONDANSETRON HCL 4 MG/2ML IJ SOLN: 4.0000 mg | Freq: Four times a day (QID) | INTRAMUSCULAR | Status: DC | PRN

## 2022-10-14 MED ORDER — FENOFIBRATE 160 MG PO TABS
160.0000 mg | ORAL_TABLET | Freq: Every day | ORAL | Status: DC
  Administered 2022-10-15 – 2022-10-18 (×4): 160 mg via ORAL
  Filled 2022-10-14 (×4): qty 1

## 2022-10-14 MED ORDER — ONDANSETRON HCL 4 MG PO TABS: 4.0000 mg | ORAL_TABLET | Freq: Four times a day (QID) | ORAL | Status: DC | PRN

## 2022-10-14 MED ORDER — METRONIDAZOLE 500 MG/100ML IV SOLN
500.0000 mg | Freq: Once | INTRAVENOUS | Status: AC
  Administered 2022-10-14: 500 mg via INTRAVENOUS
  Filled 2022-10-14: qty 100

## 2022-10-14 MED ORDER — ACETAMINOPHEN 325 MG PO TABS
650.0000 mg | ORAL_TABLET | Freq: Four times a day (QID) | ORAL | Status: DC | PRN
  Administered 2022-10-14: 650 mg via ORAL
  Filled 2022-10-14: qty 2

## 2022-10-14 MED ORDER — PANTOPRAZOLE SODIUM 40 MG PO TBEC
40.0000 mg | DELAYED_RELEASE_TABLET | Freq: Every day | ORAL | Status: DC
  Administered 2022-10-15 – 2022-10-18 (×4): 40 mg via ORAL
  Filled 2022-10-14 (×4): qty 1

## 2022-10-14 MED ORDER — OXYCODONE HCL 5 MG PO TABS: 5.0000 mg | ORAL_TABLET | ORAL | Status: DC | PRN

## 2022-10-14 MED ORDER — MORPHINE SULFATE (PF) 4 MG/ML IV SOLN
4.0000 mg | Freq: Once | INTRAVENOUS | Status: AC | PRN
  Administered 2022-10-14: 4 mg via INTRAVENOUS
  Filled 2022-10-14: qty 1

## 2022-10-14 MED ORDER — FUROSEMIDE 20 MG PO TABS
20.0000 mg | ORAL_TABLET | Freq: Every day | ORAL | Status: DC
  Administered 2022-10-15 – 2022-10-18 (×4): 20 mg via ORAL
  Filled 2022-10-14 (×4): qty 1

## 2022-10-14 MED ORDER — MIRABEGRON ER 50 MG PO TB24
50.0000 mg | ORAL_TABLET | Freq: Every day | ORAL | Status: DC
  Administered 2022-10-15 – 2022-10-18 (×4): 50 mg via ORAL
  Filled 2022-10-14 (×4): qty 1

## 2022-10-14 NOTE — ED Triage Notes (Signed)
Pt in via EMS from [facility] with c/o CP. EMS reports pain under right breast. Pt cool and clammy to the touch. 140/88, cbg 189, 97.5 temp. Pain is described as a stabbing pain that is constant and started at 11. Pt was given  of zofran IM.

## 2022-10-14 NOTE — ED Notes (Addendum)
[name] was updated

## 2022-10-14 NOTE — ED Provider Notes (Addendum)
[facility] Provider Note    Event Date/Time   First MD Initiated Contact with Patient 10/14/22 1505     (approximate)   History   Abdominal Pain   HPI  [name] is a 72 y.o. female   Past medical history of CAD, CKD, diabetes, diastolic CHF, kidney stones, hypertension hyperlipidemia, elevated BMI, atrial fibrillation on Eliquis, who presents emergency department with right-sided upper abdominal pain postprandial.  Several occurrences in the past that have been mild but today after eating breakfast she developed severe right upper quadrant abdominal pain with nausea but no vomiting.  She reports no diarrhea or GI bleeding, dysuria, and denies any other acute medical complaints.  Currently she feels well with no pain or nausea.  She has been told she has gallbladder sludge in the past.  She has no fever or chills.   External Medical Documents Reviewed: CT scan of the abdomen pelvis dated 08/25/2021 with cholelithiasis as well as a right ureteral lithiasis      Physical Exam   Triage Vital Signs: ED Triage Vitals  Enc Vitals Group     BP 10/14/22 1338 (!) 141/65     Pulse Rate 10/14/22 1338 61     Resp 10/14/22 1338 20     Temp 10/14/22 1338 97.6 F (36.4 C)     Temp src --      SpO2 10/14/22 1338 99 %     Weight 10/14/22 1338 300 lb (136.1 kg)     Height --      Head Circumference --      Peak Flow --      Pain Score 10/14/22 1335 5     Pain Loc --      Pain Edu? --      Excl. in GC? --     Most recent vital signs: Vitals:   10/14/22 1730 10/14/22 1745  BP: 118/61   Pulse: 60 60  Resp: 18   Temp: 98.1 F (36.7 C)   SpO2: 90% 91%    General: Awake, no distress.  CV:  Good peripheral perfusion.  Resp:  Normal effort.  Abd:  No distention.  Other:  Right-sided upper abdominal tenderness to palpation.  Diffuse abdominal tenderness to palpation albeit less than the right upper quadrant.  No rigidity or guarding.   ED  Results / Procedures / Treatments   Labs (all labs ordered are listed, but only abnormal results are displayed) Labs Reviewed  COMPREHENSIVE METABOLIC PANEL - Abnormal; Notable for the following components:      Result Value   Glucose, Bld 190 (*)    BUN 42 (*)    Creatinine, Ser 1.31 (*)    GFR, Estimated 44 (*)    All other components within normal limits  LIPASE, BLOOD  CBC  URINALYSIS, ROUTINE W REFLEX MICROSCOPIC     I ordered and reviewed the above labs they are notable for creatinine baseline elevated 1.3, lipase and LFTs within normal limit as well as white blood cell count normal  EKG  ED ECG REPORT I, Pilar Jarvis, the attending physician, personally viewed and interpreted this ECG.   Date: 10/14/2022  EKG Time: 1338  Rate: 62  Rhythm: v paced  ST&T Change: no stemi    RADIOLOGY I independently reviewed and interpreted right upper quadrant ultrasound and see a gallstone   PROCEDURES:  Critical Care performed: No  Procedures   MEDICATIONS ORDERED IN ED: Medications  cefTRIAXone (ROCEPHIN) 1 g  in sodium chloride 0.9 % 100 mL IVPB (has no administration in time range)  metroNIDAZOLE (FLAGYL) IVPB 500 mg (has no administration in time range)  ondansetron (ZOFRAN) injection 4 mg (4 mg Intravenous Given 10/14/22 1630)  morphine (PF) 4 MG/ML injection 4 mg (4 mg Intravenous Given 10/14/22 1630)    External physician / consultants:  I spoke with surgery consultant Dr. Aleen Campi regarding care plan for this patient.   IMPRESSION / MDM / ASSESSMENT AND PLAN / ED COURSE  I reviewed the triage vital signs and the nursing notes.                                Patient's presentation is most consistent with acute presentation with potential threat to life or bodily function.  Differential diagnosis includes, but is not limited to, cholelithiasis, cholecystitis, pancreatitis, GERD/gastritis, ulcer, appendicitis, kidney stone   The patient is on the cardiac  monitor to evaluate for evidence of arrhythmia and/or significant heart rate changes.  MDM: Patient with postprandial right upper quadrant pain and tenderness to palpation most severe in that area most concerning for cholelithiasis/cystitis.  I did a bedside ultrasound to assess for gallstones or cholecystitis but unable to visualize given patient body habitus.  I ordered for formal right upper quadrant ultrasound.  I also ordered a CT scan of the abdomen pelvis without IV contrast given her CKD, to assess for other acute intra-abdominal pathology such as appendicitis, obstruction, kidney stones, complicated pancreatitis/diverticulitis.   Evidence of acute cholecystitis on CT scan with stranding around the gallbladder and gallbladder stone, labs within normal limits including white blood cell count and LFTs patient remained stable afebrile normal hemodynamics.  Ordered for antibiotics ceftriaxone and Flagyl.  Dr. Aleen Campi has been consulted.  Patient last took Eliquis this morning.   Spoke with Dr. Aleen Campi, admit to medical service given significant medical comorbidities.  Agree with IV antibiotics.  N.p.o. now.  Will reassess in the morning to see if medical management versus IR drain versus surgery most appropriate     FINAL CLINICAL IMPRESSION(S) / ED DIAGNOSES   Final diagnoses:  Cholecystitis     Rx / DC Orders   ED Discharge Orders     None        Note:  This document was prepared using Dragon voice recognition software and may include unintentional dictation errors.    Pilar Jarvis, MD 10/14/22 Windy Carina    Pilar Jarvis, MD 10/14/22 [id number]

## 2022-10-14 NOTE — ED Notes (Signed)
Patient brief and linens changed.  Patient repositioned in bed.

## 2022-10-14 NOTE — H&P (Signed)
History and Physical    Patient: [name] ZOX:[id number] DOB: 1950-11-30 DOA: 10/14/2022 DOS: the patient was seen and examined on 10/14/2022 PCP: [facility]  Patient coming from: ALF/ILF  Chief Complaint:  Chief Complaint  Patient presents with   Abdominal Pain   HPI: [name] is a 72 y.o. female with medical history significant for CHF, morbid obesity, CAD, hypertension, OSA, severe aortic stenosis, paroxysmal A-fib, CKD stage IIIb, paroxysmal A-fib, who presents with abdominal pain.  Patient reports that for the past several weeks she has been having intermittent abdominal pain associated with cold sweats.  Earlier today she had a meal and experienced the same type of pain but much more intense and unremitting.  She let her nurse know this, was evaluated by in-house staff and sent to ED for further evaluation.  In the ED initial vital signs notable only for low normal oxygenation otherwise unremarkable.  CMP with glucose of 190, creatinine improved from usual baseline of 1.6 down to 1.3, LFTs unremarkable.  CBC was normal.  Right upper quadrant ultrasound was obtained which showed positive Murphy sign but no other findings of cholecystitis, overall study was equivocal.  CT abdomen and pelvis was then obtained which showed stranding and thickening of the area of the gallbladder as well as cholelithiasis, as well as incidental nephrolithiasis and generally high burden of stool.  Dr. Aleen Campi from general surgery was consulted and recommended antibiotics and reevaluation in the morning given she is a poor surgical candidate with multiple comorbidities.  She was started on IV ceftriaxone and Flagyl, made n.p.o., and admitted for further management.  Review of Systems: As mentioned in the history of present illness. All other systems reviewed and are negative. Past Medical History:  Diagnosis Date   Acute kidney failure    Acute metabolic encephalopathy    Acute  respiratory failure with hypoxia 09/2021   Bacteremia    CAD (coronary artery disease)    Chronic kidney disease    Diabetes mellitus without complication    Diastolic CHF    Dysphagia    Dysrhythmia    Elevated troponin 04/29/2022   History of kidney stones    Hyperlipemia    Hypertension    Hypoglycemia 05/02/2022   Lymphedema    MI (myocardial infarction)    Morbid obesity    OSA (obstructive sleep apnea)    Presence of permanent cardiac pacemaker    Sepsis due to Escherichia coli with acute hypoxic respiratory failure    Severe aortic stenosis    UTI (urinary tract infection) 04/29/2022   Wounds, multiple    Past Surgical History:  Procedure Laterality Date   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 She has never used smokeless tobacco. She reports that she does not drink alcohol and does not use drugs.  Allergies  Allergen Reactions   Tetanus Toxoid, Adsorbed Swelling   Tetanus Toxoids Swelling   Lisinopril Rash    Other reaction(s): Unknown   Niacin Rash    Other reaction(s):  Unknown   Sitagliptin Rash    Other reaction(s): Unknown   Sulfamethoxazole-Trimethoprim Rash    Other reaction(s): Unknown    Family History  Problem Relation Age of Onset   Cancer Mother    Heart failure Father     Prior to Admission medications   Medication Sig Start Date End Date Taking? Authorizing Provider  acetaminophen (TYLENOL) 325 MG tablet Take 2 tablets (650 mg  total) by mouth every 6 (six) hours as needed for mild pain (or Fever >/= 101). Patient taking differently: Take 650-975 mg by mouth See admin instructions. Take 975 mg at bedtime, may take 650 mg every 6 hours as needed for pain 11/05/17   Alford Highland, MD  Amino Acids-Protein Hydrolys (FEEDING SUPPLEMENT, PRO-STAT SUGAR FREE 64,) LIQD Take 30 mLs by mouth 2 (two) times daily.    [provider]  apixaban (ELIQUIS) 5 MG TABS tablet Take 1 tablet (5 mg total) by mouth 2 (two) times daily. 09/06/21   Hollice Espy, MD  atorvastatin (LIPITOR) 40 MG tablet Take 1 tablet (40 mg total) by mouth daily at 6 PM. 09/06/21   Hollice Espy, MD  barrier cream (NON-SPECIFIED) CREA Apply 1 application. topically 3 (three) times daily. Every shift    [provider]  Cholecalciferol 50 MCG (2000 UT) CAPS Take 1 capsule (2,000 Units total) by mouth daily. 09/06/21   Hollice Espy, MD  clonazePAM (KLONOPIN) 0.5 MG tablet Take 0.5 mg by mouth 2 (two) times daily as needed. 03/10/22   [provider]  docusate sodium (COLACE) 100 MG capsule Take 1 capsule (100 mg total) by mouth 2 (two) times daily as needed for mild constipation. 05/10/22   Pennie Banter, DO  fenofibrate (TRICOR) 145 MG tablet Take 1 tablet (145 mg total) by mouth daily. 09/06/21   Hollice Espy, MD  furosemide (LASIX) 20 MG tablet Take 20 mg by mouth daily. 04/09/22   [provider]  LEVEMIR FLEXPEN 100 UNIT/ML FlexPen Inject 35 Units into the skin daily. 05/10/22   Esaw Grandchild A, DO  lido-EPINEPHrine-Tetracaine (L.E.T.) 4-0.05-0.5 % GEL Apply 1 mL topically every hour as needed (leg ulcer pain). 03/23/22   Merwyn Katos, MD  Multiple Vitamin (MULTIVITAMIN WITH MINERALS) TABS tablet Take 1 tablet by mouth daily. 09/07/21   Hollice Espy, MD  MYRBETRIQ 50 MG TB24 tablet Take 50 mg by mouth daily. 04/09/22   [provider]  nystatin (MYCOSTATIN/NYSTOP) powder SMARTSIG:1  Application Topical 2-3 Times Daily 03/01/22   [provider]  omeprazole (PRILOSEC) 20 MG capsule Take 20 mg by mouth daily. 04/09/22   [provider]  oxyCODONE (OXY IR/ROXICODONE) 5 MG immediate release tablet Take 0.5-1 tablets (2.5-5 mg total) by mouth every 8 (eight) hours as needed for moderate pain or severe pain. 05/10/22   Pennie Banter, DO    Physical Exam: Vitals:   10/14/22 1700 10/14/22 1730 10/14/22 1745 10/14/22 1950  BP: 130/61 118/61  (!) 123/53  Pulse: 62 60 60 62  Resp:  18  20  Temp:  98.1 F (36.7 C)  98.3 F (36.8 C)  TempSrc:  Oral  Oral  SpO2: 91% 90% 91% 94%  Weight:       Physical Exam Constitutional:      General: She is not in acute distress.    Appearance: She is not ill-appearing, toxic-appearing or diaphoretic.     Comments: Morbidly obese  HENT:     Mouth/Throat:  Comments: Tacky mucous membranes Eyes:     General: No scleral icterus. Cardiovascular:     Rate and Rhythm: Normal rate and regular rhythm.     Heart sounds: Normal heart sounds.  Pulmonary:     Effort: Pulmonary effort is normal.     Breath sounds: Normal breath sounds.  Abdominal:     General: Abdomen is protuberant. Bowel sounds are normal.     Palpations: Abdomen is soft.     Tenderness: There is generalized abdominal tenderness and tenderness in the right upper quadrant. There is no guarding or rebound.  Skin:    General: Skin is warm and dry.  Neurological:     General: No focal deficit present.     Mental Status: She is alert.  Psychiatric:        Mood and Affect: Mood normal.        Behavior: Behavior normal.      Data Reviewed: Results for orders placed or performed during the hospital encounter of 10/14/22 (from the past 24 hour(s))  Lipase, blood     Status: None   Collection Time: 10/14/22  1:39 PM  Result Value Ref Range   Lipase 27 11 - 51 U/L  Comprehensive metabolic panel     Status: Abnormal   Collection Time: 10/14/22  1:39  PM  Result Value Ref Range   Sodium 140 135 - 145 mmol/L   Potassium 3.9 3.5 - 5.1 mmol/L   Chloride 104 98 - 111 mmol/L   CO2 27 22 - 32 mmol/L   Glucose, Bld 190 (H) 70 - 99 mg/dL   BUN 42 (H) 8 - 23 mg/dL   Creatinine, Ser 3.08 (H) 0.44 - 1.00 mg/dL   Calcium 9.1 8.9 - 65.7 mg/dL   Total Protein 6.9 6.5 - 8.1 g/dL   Albumin 3.5 3.5 - 5.0 g/dL   AST 20 15 - 41 U/L   ALT 16 0 - 44 U/L   Alkaline Phosphatase 57 38 - 126 U/L   Total Bilirubin 1.1 0.3 - 1.2 mg/dL   GFR, Estimated 44 (L) >60 mL/min   Anion gap 9 5 - 15  CBC     Status: None   Collection Time: 10/14/22  1:39 PM  Result Value Ref Range   WBC 6.8 4.0 - 10.5 K/uL   RBC 4.97 3.87 - 5.11 MIL/uL   Hemoglobin 14.7 12.0 - 15.0 g/dL   HCT 84.6 96.2 - 95.2 %   MCV 89.5 80.0 - 100.0 fL   MCH 29.6 26.0 - 34.0 pg   MCHC 33.0 30.0 - 36.0 g/dL   RDW 84.1 32.4 - 40.1 %   Platelets 162 150 - 400 K/uL   nRBC 0.0 0.0 - 0.2 %   CT ABDOMEN PELVIS WO CONTRAST CLINICAL DATA:  Right-sided abdominal pain. Diffuse abdominal tenderness. Known history of kidney stones  EXAM: CT ABDOMEN AND PELVIS WITHOUT CONTRAST  TECHNIQUE: Multidetector CT imaging of the abdomen and pelvis was performed following the standard protocol without IV contrast.  RADIATION DOSE REDUCTION: This exam was performed according to the departmental dose-optimization program which includes automated exposure control, adjustment of the mA and/or kV according to patient size and/or use of iterative reconstruction technique.  COMPARISON:  Abdominal ultrasound earlier 10/14/2022. X-ray 08/26/2021. Abdomen pelvis CT 08/25/2021  FINDINGS: Lower chest: Heart is enlarged. Status post TAVR. Pacemaker leads. There is some linear opacity as well along the lung bases likely scar or atelectasis. No pleural effusion.  Hepatobiliary: Gallbladder is  mildly distended and there is significant stranding with stones. Please correlate for evidence of acute cholecystitis no  hepatic space-occupying lesion on this noncontrast exam.  Pancreas: Diffuse fatty infiltration of the pancreas. No obvious mass.  Spleen: Normal in size without focal abnormality.  Adrenals/Urinary Tract: Right adrenal gland is preserved. 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 Mild bilateral renal atrophy. Punctate nonobstructing upper pole right-sided renal stone best seen on coronal imaging. No left-sided renal stone. No ureteral stones. Preserved contours of the urinary bladder.  Stomach/Bowel: There is some fluid and debris in the stomach. Stomach is nondilated. Small bowel is nondilated. Large bowel has scattered moderate colonic stool. There is significant stool in the rectum with some stranding please correlate for any clinical evidence of stercoral colitis. Normal appendix.  Vascular/Lymphatic: Normal caliber aorta and IVC scattered vascular calcifications. Mildly ectatic celiac axis diameter approaching 13 mm. No discrete abnormal lymph node enlargement identified in the abdomen and pelvis.  Reproductive: Uterus and bilateral adnexa are unremarkable.  Other: No free air or free fluid.  Musculoskeletal: Degenerative changes are seen along the spine and pelvis. There are areas of stenosis along the lumbar spine.  IMPRESSION: Punctate nonobstructing upper pole right-sided renal stone. No ureteral stones. Mild bilateral renal atrophy.  Stranding and wall thickening in the area of the gallbladder with a stone. As per ultrasound please correlate for clinical evidence of acute cholecystitis. Additional workup with HIDA scan as clinically appropriate.  No bowel obstruction or free air. However there is significant stool in the rectum with some adjacent stranding. Please correlate for any evidence of stercoral colitis  Electronically Signed   By: Karen Kays M.D.   On: 10/14/2022 18:18 US ABDOMEN LIMITED RUQ  (LIVER/GB) CLINICAL DATA:  On and off pain for 1 month.  EXAM: ULTRASOUND ABDOMEN LIMITED RIGHT UPPER QUADRANT  COMPARISON:  CT abdomen pelvis dated 08/25/2021.  FINDINGS: Gallbladder:  A 9 mm gallstone is noted. No gallbladder wall thickening visualized. A positive sonographic Eulah Pont sign was noted by sonographer.  Common bile duct:  Diameter: 5 mm  Liver:  No focal lesion identified. Within normal limits in parenchymal echogenicity. Portal vein is patent on color Doppler imaging with normal direction of blood flow towards the liver.  Other: None.  IMPRESSION: Cholelithiasis with a positive sonographic Murphy sign, but no gallbladder wall thickening or pericholecystic fluid. Findings are equivocal for acute cholecystitis.  Electronically Signed   By: Romona Curls M.D.   On: 10/14/2022 17:02   Assessment and Plan: [name] is a 72 y.o. female with medical history significant for CHF, morbid obesity, CAD, hypertension, OSA, severe aortic stenosis, paroxysmal A-fib, CKD stage IIIb, paroxysmal A-fib, who presents with abdominal pain and is found to have acute cholecystitis.   * Acute cholecystitis Patient hemodynamically stable and without evidence of sepsis.  Medical management for now pending surgery reevaluation in the a.m. - Ceftriaxone and Flagyl - N.p.o. except sips with meds - IV fluids at reduced rate given underlying CHF-LR at 75 cc an hour set to run out in 12 hours - Follow-up general surgery recommendations  Known medical problems A-fib-continue apixaban Hyperlipidemia-continue atorvastatin, fenofibrate Anxiety-continue clonazepam as needed CHF-continue Lasix 20 mg daily Type 2 diabetes-reduce home Levemir to 18 units once daily, sliding scale insulin correction OAB-continue Myrbetriq GERD-continue PPI      Advance Care Planning:   Code Status: Full Code , confirmed with aptient  Consults: General surgery  Family Communication:  None  Severity of Illness: The appropriate patient status for this patient is INPATIENT. Inpatient status is judged to be reasonable and necessary in order to provide the required intensity of service to ensure the patient's safety. The patient's presenting symptoms, physical exam findings, and initial radiographic and laboratory data in the context of their chronic comorbidities is felt to place them at high risk for further clinical deterioration. Furthermore, it is not anticipated that the patient will be medically stable for discharge from the hospital within 2 midnights of admission.   * I certify that at the point of admission it is my clinical judgment that the patient will require inpatient hospital care spanning beyond 2 midnights from the point of admission due to high intensity of service, high risk for further deterioration and high frequency of surveillance required.*  Author: Venora Maples, MD 10/14/2022 8:24 PM  For on call review [email or web address].

## 2022-10-14 NOTE — ED Notes (Addendum)
[name] [phone].

## 2022-10-14 NOTE — ED Triage Notes (Signed)
Pt via EMS from [facility]. Pt c/o R sided upper abd pain after eating breakfast this AM. Pt states that she also felt diaphoretic, states she also felt nausea during this time. EMS gave  of Zofran. Pt is A&Ox4 and NAD

## 2022-10-14 NOTE — Assessment & Plan Note (Addendum)
Patient hemodynamically stable and without evidence of sepsis.  Medical management for now pending surgery reevaluation in the a.m. - General surgery following, appreciate recommendations.  Poor surgical candidate, medical management. - Transition IV Rocephin & Flagyl >> PO Augmentin to complete total 10 day course - On clear liquids, surgery to advance to soft later today - Off IV fluids  --Pain control and IV antiemetics PRN --Consult IR for perc chole drain if clinically worsening --Resume Eliquis

## 2022-10-14 NOTE — Assessment & Plan Note (Addendum)
A-fib-continue apixaban Hyperlipidemia-continue atorvastatin, fenofibrate Anxiety-continue clonazepam as needed CHF-continue Lasix 20 mg daily Type 2 diabetes-reduce home Levemir to 18 units once daily, sliding scale insulin correction OAB-continue Myrbetriq GERD-continue PPI

## 2022-10-15 DIAGNOSIS — K810 Acute cholecystitis: Secondary | ICD-10-CM | POA: Diagnosis not present

## 2022-10-15 LAB — GLUCOSE, CAPILLARY
Glucose-Capillary: 104 mg/dL — ABNORMAL HIGH (ref 70–99)
Glucose-Capillary: 63 mg/dL — ABNORMAL LOW (ref 70–99)
Glucose-Capillary: 79 mg/dL (ref 70–99)
Glucose-Capillary: 81 mg/dL (ref 70–99)
Glucose-Capillary: 89 mg/dL (ref 70–99)
Glucose-Capillary: 91 mg/dL (ref 70–99)

## 2022-10-15 LAB — COMPREHENSIVE METABOLIC PANEL
ALT: 104 U/L — ABNORMAL HIGH (ref 0–44)
AST: 261 U/L — ABNORMAL HIGH (ref 15–41)
Albumin: 3.1 g/dL — ABNORMAL LOW (ref 3.5–5.0)
Alkaline Phosphatase: 67 U/L (ref 38–126)
Anion gap: 11 (ref 5–15)
BUN: 43 mg/dL — ABNORMAL HIGH (ref 8–23)
CO2: 24 mmol/L (ref 22–32)
Calcium: 9.3 mg/dL (ref 8.9–10.3)
Chloride: 106 mmol/L (ref 98–111)
Creatinine, Ser: 1.33 mg/dL — ABNORMAL HIGH (ref 0.44–1.00)
GFR, Estimated: 43 mL/min — ABNORMAL LOW (ref >60)
Glucose, Bld: 103 mg/dL — ABNORMAL HIGH (ref 70–99)
Potassium: 4.9 mmol/L (ref 3.5–5.1)
Sodium: 141 mmol/L (ref 135–145)
Total Bilirubin: 1.2 mg/dL (ref 0.3–1.2)
Total Protein: 6.6 g/dL (ref 6.5–8.1)

## 2022-10-15 LAB — CBC
HCT: 42.6 % (ref 36.0–46.0)
Hemoglobin: 14.2 g/dL (ref 12.0–15.0)
MCH: 29.5 pg (ref 26.0–34.0)
MCHC: 33.3 g/dL (ref 30.0–36.0)
MCV: 88.4 fL (ref 80.0–100.0)
Platelets: 164 10*3/uL (ref 150–400)
RBC: 4.82 MIL/uL (ref 3.87–5.11)
RDW: 12.8 % (ref 11.5–15.5)
WBC: 6.9 10*3/uL (ref 4.0–10.5)
nRBC: 0 % (ref 0.0–0.2)

## 2022-10-15 LAB — MRSA NEXT GEN BY PCR, NASAL: MRSA by PCR Next Gen: DETECTED — AB

## 2022-10-15 MED ORDER — DEXTROSE 50 % IV SOLN
12.5000 g | INTRAVENOUS | Status: AC
  Administered 2022-10-15: 12.5 g via INTRAVENOUS
  Filled 2022-10-15: qty 50

## 2022-10-15 NOTE — Progress Notes (Signed)
Progress Note   Patient: [name] ONG:[id number] DOB: [birth year] DOA: 10/14/2022     1 DOS: the patient was seen and examined on 10/15/2022   Brief hospital course: HPI on admission 10/14/22 by Dr. Crissie Reese: "Sophiya Morello is a 72 y.o. female with medical history significant for CHF, morbid obesity, CAD, hypertension, OSA, severe aortic stenosis, paroxysmal A-fib, CKD stage IIIb, paroxysmal A-fib, who presents with abdominal pain.   Patient reports that for the past several weeks she has been having intermittent abdominal pain associated with cold sweats.  Earlier today she had a meal and experienced the same type of pain but much more intense and unremitting.  She let her nurse know this, was evaluated by in-house staff and sent to ED for further evaluation.   In the ED initial vital signs notable only for low normal oxygenation otherwise unremarkable.  CMP with glucose of 190, creatinine improved from usual baseline of 1.6 down to 1.3, LFTs unremarkable.  CBC was normal.  Right upper quadrant ultrasound was obtained which showed positive Murphy sign but no other findings of cholecystitis, overall study was equivocal.  CT abdomen and pelvis was then obtained which showed stranding and thickening of the area of the gallbladder as well as cholelithiasis, as well as incidental nephrolithiasis and generally high burden of stool.  Dr. Aleen Campi from general surgery was consulted and recommended antibiotics and reevaluation in the morning given she is a poor surgical candidate with multiple comorbidities.  She was started on IV ceftriaxone and Flagyl, made n.p.o., and admitted for further management."  4/21 -- seen by general surgery. Felt poor surgical candidate given comorbidities and on Eliquis at home.  Recommend conservative management with IV antibiotics.  May require percutaneous chole drain if clinically worsening. Eliquis on hold.  NPO w/ sips or ice chips for now.  Assessment and Plan: *  Acute cholecystitis Patient hemodynamically stable and without evidence of sepsis.  Medical management for now pending surgery reevaluation in the a.m. - General surgery following, appreciate recommendations.  Poor surgical candidate, medical management. - Continue IV Rocephin & Flagyl - N.p.o. except sips with meds, ice chips - Maintenance IV fluids  --Pain control and IV antiemetics PRN --Consult IR for perc chole drain if clinically worsening --Hold Eliquis  Known medical problems A-fib- HOLD Eliquis Hyperlipidemia-continue atorvastatin, fenofibrate Anxiety-continue clonazepam as needed CHF-continue Lasix 20 mg daily Type 2 diabetes-reduce home Levemir to 18 units once daily, sliding scale insulin correction OAB-continue Myrbetriq GERD-continue PPI        Subjective: Pt was sleeping comfortably when seen this AM. She woke briefly, reported being very tired.  Denies acute complaints.  Pain is improved.  Physical Exam: Vitals:   10/14/22 2030 10/14/22 2117 10/15/22 0505 10/15/22 0704  BP: (!) 131/57 (!) 146/85 116/77 (!) 123/51  Pulse: (!) 59 87 61 61  Resp: Temp: 98.3 F (36.8 C) (!) 97.5 F (36.4 C) 97.8 F (36.6 C) 97.6 F (36.4 C)  TempSrc: Oral Oral Oral Oral  SpO2: 94% 100% 96% 97%  Weight:  120 kg    Height:   (1.626 m)     General exam: sleeping comfortably, woke briefly to voice, no acute distress, morbidly obese HEENT: moist mucus membranes, hearing grossly normal  Respiratory system: CTA, no wheezes, rales or rhonchi, normal respiratory effort. Cardiovascular system: normal S1/S2, RRR.   Gastrointestinal system: soft, NT, ND, no HSM felt, +bowel sounds. Central nervous system: exam limited by somnolence, no  gross focal neurologic deficits, normal speech Extremities: moves all, no edema, normal tone Skin: dry, intact, normal temperature Psychiatry: exam limited by somnolence   Data Reviewed:  Notable labs --- glucose 103, BUN 43, Cr  1.33 from 1.31 stable, AST 261 (prior 20), ALT 104 (prior 16), T bili normal 1.2.  CBC is normal, no leukocytosis  +MRSA nares  Family Communication: None present, will attempt to call as time allows  Disposition: Status is: Inpatient Remains inpatient appropriate because: remains on IV therapies as above and remains NPO   Planned Discharge Destination:  return to ALF/ILF vs rehab    Time spent: 42 minutes  Author: Pennie Banter, DO 10/15/2022 12:49 PM  For on call review [email or web address].

## 2022-10-15 NOTE — Hospital Course (Signed)
HPI on admission 10/14/22 by Dr. Crissie Reese: "[name] is a 72 y.o. female with medical history significant for CHF, morbid obesity, CAD, hypertension, OSA, severe aortic stenosis, paroxysmal A-fib, CKD stage IIIb, paroxysmal A-fib, who presents with abdominal pain.   Patient reports that for the past several weeks she has been having intermittent abdominal pain associated with cold sweats.  Earlier today she had a meal and experienced the same type of pain but much more intense and unremitting.  She let her nurse know this, was evaluated by in-house staff and sent to ED for further evaluation.   In the ED initial vital signs notable only for low normal oxygenation otherwise unremarkable.  CMP with glucose of 190, creatinine improved from usual baseline of 1.6 down to 1.3, LFTs unremarkable.  CBC was normal.  Right upper quadrant ultrasound was obtained which showed positive Murphy sign but no other findings of cholecystitis, overall study was equivocal.  CT abdomen and pelvis was then obtained which showed stranding and thickening of the area of the gallbladder as well as cholelithiasis, as well as incidental nephrolithiasis and generally high burden of stool.  Dr. Aleen Campi from general surgery was consulted and recommended antibiotics and reevaluation in the morning given she is a poor surgical candidate with multiple comorbidities.  She was started on IV ceftriaxone and Flagyl, made n.p.o., and admitted for further management."  4/21 -- seen by general surgery. Felt poor surgical candidate given comorbidities and on Eliquis at home.  Recommend conservative management with IV antibiotics.  May require percutaneous chole drain if clinically worsening. Eliquis on hold.  NPO w/ sips or ice chips for now.

## 2022-10-15 NOTE — Consult Note (Signed)
Date of Consultation:  10/15/2022  Requesting Physician:  Pilar Jarvis, MD  Reason for Consultation:  Acute cholecystitis  History of Present Illness: [name] is a 72 y.o. female admitted last night with a one day history of RUQ associated with nausea, vomiting, cold sweats, and overall not feeling well.  She reports the symptoms started after breakfast.  Her symptoms progressed after breakfast and the nursing staff at her ALF examined her and recommended transfer to a hospital.  In the ER, she was afebrile with otherwise normal vital signs.  Laboratory workup showed a creatinine of 1.31 which is better than her baseline, normal LFTs, normal white blood cell count of 6.8.  However on imaging, ultrasound showed cholelithiasis with a positive sonographic Murphy's sign.  However there was no gallbladder wall thickening or pericholecystic fluid noted.  CT scan was then obtained given that she also has a history of kidney stones and this 1 did show significant stranding around the gallbladder with stones.  The patient is mostly bed-bound or on a wheelchair and does not ambulate.  She has multiple medical comorbidities including chronic kidney disease, coronary artery disease, diabetes, CHF, morbid obesity, obstructive sleep apnea, also status post TAVR and pacemaker placement, and cystoscopy for kidney stones.  She is currently on Eliquis.  This morning, the patient reports that she is feeling better and reports less pain compared to yesterday.  Denies any nausea currently.  She was started on ceftriaxone and Flagyl in the emergency room.  Her white blood cell count today remains normal although her AST and ALT are little bit elevated to 261 and 104 respectively.  Past Medical History: Past Medical History:  Diagnosis Date   Acute kidney failure    Acute metabolic encephalopathy    Acute respiratory failure with hypoxia 09/2021   Bacteremia    CAD (coronary artery disease)    Chronic kidney  disease    Diabetes mellitus without complication    Diastolic CHF    Dysphagia    Dysrhythmia    Elevated troponin 04/29/2022   History of kidney stones    Hyperlipemia    Hypertension    Hypoglycemia 05/02/2022   Lymphedema    MI (myocardial infarction)    Morbid obesity    OSA (obstructive sleep apnea)    Presence of permanent cardiac pacemaker    Sepsis due to Escherichia coli with acute hypoxic respiratory failure    Severe aortic stenosis    UTI (urinary tract infection) 04/29/2022   Wounds, multiple      Past Surgical History: Past Surgical History:  Procedure Laterality Date   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   CYSTOSCOPY W/ URETERAL STENT PLACEMENT  08/25/2021   Procedure: CYSTOSCOPY WITH RETROGRADE PYELOGRAM/URETERAL STENT PLACEMENT;  Surgeon: Sondra Come, MD;  Location: [facility] ORS;  Service: Urology;;   CYSTOSCOPY/URETEROSCOPY/HOLMIUM LASER/STENT PLACEMENT Bilateral 10/21/2021   Procedure: CYSTOSCOPY/URETEROSCOPY/HOLMIUM LASER/STENT PLACEMENT;  Surgeon: Sondra Come, MD;  Location: [facility] ORS;  Service: Urology;  Laterality: Bilateral;   none     PACEMAKER INSERTION      Home Medications: Prior to Admission medications   Medication Sig Start Date End Date Taking? Authorizing Provider  acetaminophen (TYLENOL) 325 MG tablet Take 2 tablets (650 mg total) by mouth every 6 (six) hours as needed for mild pain (or Fever >/= 101). Patient taking differently: Take 650-975 mg by mouth See admin instructions. Take 975 mg at bedtime, may take 650 mg every 6 hours as needed for  pain 11/05/17  Yes Wieting, Richard, MD  apixaban (ELIQUIS) 5 MG TABS tablet Take 1 tablet (5 mg total) by mouth 2 (two) times daily. 09/06/21  Yes Hollice Espy, MD  atorvastatin (LIPITOR) 40 MG tablet Take 1 tablet (40 mg total) by mouth daily at 6 PM. 09/06/21  Yes Hollice Espy, MD  barrier cream (NON-SPECIFIED) CREA Apply 1 application. topically 3 (three) times daily. Every shift   Yes  [provider]  Cholecalciferol 50 MCG (2000 UT) CAPS Take 1 capsule (2,000 Units total) by mouth daily. 09/06/21  Yes Hollice Espy, MD  docusate sodium (COLACE) 100 MG capsule Take 1 capsule (100 mg total) by mouth 2 (two) times daily as needed for mild constipation. 05/10/22  Yes Esaw Grandchild A, DO  fenofibrate (TRICOR) 145 MG tablet Take 1 tablet (145 mg total) by mouth daily. 09/06/21  Yes Hollice Espy, MD  furosemide (LASIX) 20 MG tablet Take 20 mg by mouth daily. 04/09/22  Yes [provider]  gabapentin (NEURONTIN) 100 MG capsule Take 100 mg by mouth daily. 10/09/22  Yes [provider]  LEVEMIR FLEXPEN 100 UNIT/ML FlexPen Inject 35 Units into the skin daily. 05/10/22  Yes Esaw Grandchild A, DO  lido-EPINEPHrine-Tetracaine (L.E.T.) 4-0.05-0.5 % GEL Apply 1 mL topically every hour as needed (leg ulcer pain). 03/23/22  Yes Bradler, Clent Jacks, MD  LORazepam (ATIVAN) 0.5 MG tablet Take 0.5 mg by mouth every 8 (eight) hours as needed for anxiety.   Yes [provider]  Multiple Vitamin (MULTIVITAMIN WITH MINERALS) TABS tablet Take 1 tablet by mouth daily. 09/07/21  Yes Hollice Espy, MD  MYRBETRIQ 50 MG TB24 tablet Take 50 mg by mouth daily. 04/09/22  Yes [provider]  nystatin (MYCOSTATIN/NYSTOP) powder SMARTSIG:1 Application Topical 2-3 Times Daily 03/01/22  Yes [provider]  omeprazole (PRILOSEC) 20 MG capsule Take 20 mg by mouth daily. 04/09/22  Yes [provider]  Amino Acids-Protein Hydrolys (FEEDING SUPPLEMENT, PRO-STAT SUGAR FREE 64,) LIQD Take 30 mLs by mouth 2 (two) times daily.    [provider]  clonazePAM (KLONOPIN) 0.5 MG tablet Take 0.5 mg by mouth 2 (two) times daily as needed. Patient not taking: Reported on 10/14/2022 03/10/22   [provider]  oxyCODONE (OXY IR/ROXICODONE) 5 MG immediate release tablet Take 0.5-1 tablets (2.5-5 mg total) by mouth every 8 (eight) hours as needed for  moderate pain or severe pain. Patient not taking: Reported on 10/14/2022 05/10/22   Esaw Grandchild A, DO    Allergies: Allergies  Allergen Reactions   Tetanus Toxoid, Adsorbed Swelling   Tetanus Toxoids Swelling   Lisinopril Rash    Other reaction(s): Unknown   Niacin Rash    Other reaction(s): Unknown   Sitagliptin Rash    Other reaction(s): Unknown   Sulfamethoxazole-Trimethoprim Rash    Other reaction(s): Unknown    Social History:  reports that she has never smoked. She has never used smokeless tobacco. She reports that she does not drink alcohol and does not use drugs.   Family History: Family History  Problem Relation Age of Onset   Cancer Mother    Heart failure Father     Review of Systems: Review of Systems  Constitutional:  Positive for chills and diaphoresis. Negative for fever.  HENT:  Negative for hearing loss.   Respiratory:  Negative for shortness of breath.   Cardiovascular:  Negative for chest pain.  Gastrointestinal:  Positive for abdominal pain, nausea and vomiting.  Genitourinary:  Negative for dysuria.  Musculoskeletal:  Negative for myalgias.  Skin:  Negative for rash.  Neurological:  Negative for dizziness.  Psychiatric/Behavioral:  Negative for depression.     Physical Exam BP (!) 123/51 (BP Location: Right Arm)   Pulse 61   Temp 97.6 F (36.4 C) (Oral)   Resp 16   Ht 5\' 4"  (1.626 m)   Wt 120 kg   SpO2 97%   BMI 45.41 kg/m  CONSTITUTIONAL: No acute distress HEENT:  Normocephalic, atraumatic, extraocular motion intact. NECK: Trachea is midline, and there is no jugular venous distension. RESPIRATORY: Normal respiratory effort without pathologic use of accessory muscles. CARDIOVASCULAR: Regular rhythm and rate via pacemaker. GI: The abdomen is soft, obese, nondistended, with soreness to palpation in the right upper quadrant.  Negative Murphy's sign.  MUSCULOSKELETAL:  Normal muscle strength and tone in all four extremities.  No  peripheral edema or cyanosis. SKIN: Skin turgor is normal. There are no pathologic skin lesions.  NEUROLOGIC:  Motor and sensation is grossly normal.  Cranial nerves are grossly intact. PSYCH:  Alert and oriented to person, place and time. Affect is normal.  Laboratory Analysis: Results for orders placed or performed during the hospital encounter of 10/14/22 (from the past 24 hour(s))  Lipase, blood     Status: None   Collection Time: 10/14/22  1:39 PM  Result Value Ref Range   Lipase 27 11 - 51 U/L  Comprehensive metabolic panel     Status: Abnormal   Collection Time: 10/14/22  1:39 PM  Result Value Ref Range   Sodium 140 135 - 145 mmol/L   Potassium 3.9 3.5 - 5.1 mmol/L   Chloride 104 98 - 111 mmol/L   CO2 27 22 - 32 mmol/L   Glucose, Bld 190 (H) 70 - 99 mg/dL   BUN 42 (H) 8 - 23 mg/dL   Creatinine, Ser 1.61 (H) 0.44 - 1.00 mg/dL   Calcium 9.1 8.9 - 09.6 mg/dL   Total Protein 6.9 6.5 - 8.1 g/dL   Albumin 3.5 3.5 - 5.0 g/dL   AST 20 15 - 41 U/L   ALT 16 0 - 44 U/L   Alkaline Phosphatase 57 38 - 126 U/L   Total Bilirubin 1.1 0.3 - 1.2 mg/dL   GFR, Estimated 44 (L) >60 mL/min   Anion gap 9 5 - 15  CBC     Status: None   Collection Time: 10/14/22  1:39 PM  Result Value Ref Range   WBC 6.8 4.0 - 10.5 K/uL   RBC 4.97 3.87 - 5.11 MIL/uL   Hemoglobin 14.7 12.0 - 15.0 g/dL   HCT 04.5 40.9 - 81.1 %   MCV 89.5 80.0 - 100.0 fL   MCH 29.6 26.0 - 34.0 pg   MCHC 33.0 30.0 - 36.0 g/dL   RDW 91.4 78.2 - 95.6 %   Platelets 162 150 - 400 K/uL   nRBC 0.0 0.0 - 0.2 %  Glucose, capillary     Status: Abnormal   Collection Time: 10/14/22  9:43 PM  Result Value Ref Range   Glucose-Capillary 165 (H) 70 - 99 mg/dL  MRSA Next Gen by PCR, Nasal     Status: Abnormal   Collection Time: 10/15/22  2:20 AM   Specimen: Nasal Mucosa; Nasal Swab  Result Value Ref Range   MRSA by PCR Next Gen DETECTED (A) NOT DETECTED  Comprehensive metabolic panel     Status: Abnormal   Collection Time: 10/15/22   4:09  AM  Result Value Ref Range   Sodium 141 135 - 145 mmol/L   Potassium 4.9 3.5 - 5.1 mmol/L   Chloride 106 98 - 111 mmol/L   CO2 24 22 - 32 mmol/L   Glucose, Bld 103 (H) 70 - 99 mg/dL   BUN 43 (H) 8 - 23 mg/dL   Creatinine, Ser 1.61 (H) 0.44 - 1.00 mg/dL   Calcium 9.3 8.9 - 09.6 mg/dL   Total Protein 6.6 6.5 - 8.1 g/dL   Albumin 3.1 (L) 3.5 - 5.0 g/dL   AST 045 (H) 15 - 41 U/L   ALT 104 (H) 0 - 44 U/L   Alkaline Phosphatase 67 38 - 126 U/L   Total Bilirubin 1.2 0.3 - 1.2 mg/dL   GFR, Estimated 43 (L) >60 mL/min   Anion gap 11 5 - 15  CBC     Status: None   Collection Time: 10/15/22  4:09 AM  Result Value Ref Range   WBC 6.9 4.0 - 10.5 K/uL   RBC 4.82 3.87 - 5.11 MIL/uL   Hemoglobin 14.2 12.0 - 15.0 g/dL   HCT 40.9 81.1 - 91.4 %   MCV 88.4 80.0 - 100.0 fL   MCH 29.5 26.0 - 34.0 pg   MCHC 33.3 30.0 - 36.0 g/dL   RDW 78.2 95.6 - 21.3 %   Platelets 164 150 - 400 K/uL   nRBC 0.0 0.0 - 0.2 %  Glucose, capillary     Status: None   Collection Time: 10/15/22  7:57 AM  Result Value Ref Range   Glucose-Capillary 89 70 - 99 mg/dL   Comment 1 Notify RN    Comment 2 Document in Chart     Imaging: CT ABDOMEN PELVIS WO CONTRAST  Result Date: 10/14/2022 CLINICAL DATA:  Right-sided abdominal pain. Diffuse abdominal tenderness. Known history of kidney stones EXAM: CT ABDOMEN AND PELVIS WITHOUT CONTRAST TECHNIQUE: Multidetector CT imaging of the abdomen and pelvis was performed following the standard protocol without IV contrast. RADIATION DOSE REDUCTION: This exam was performed according to the departmental dose-optimization program which includes automated exposure control, adjustment of the mA and/or kV according to patient size and/or use of iterative reconstruction technique. COMPARISON:  Abdominal ultrasound earlier 10/14/2022. X-ray 08/26/2021. Abdomen pelvis CT 08/25/2021 FINDINGS: Lower chest: Heart is enlarged. Status post TAVR. Pacemaker leads. There is some linear opacity as well  along the lung bases likely scar or atelectasis. No pleural effusion. Hepatobiliary: Gallbladder is mildly distended and there is significant stranding with stones. Please correlate for evidence of acute cholecystitis no hepatic space-occupying lesion on this noncontrast exam. Pancreas: Diffuse fatty infiltration of the pancreas. No obvious mass. Spleen: Normal in size without focal abnormality. Adrenals/Urinary Tract: Right adrenal gland is preserved. 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 Mild bilateral renal atrophy. Punctate nonobstructing upper pole right-sided renal stone best seen on coronal imaging. No left-sided renal stone. No ureteral stones. Preserved contours of the urinary bladder. Stomach/Bowel: There is some fluid and debris in the stomach. Stomach is nondilated. Small bowel is nondilated. Large bowel has scattered moderate colonic stool. There is significant stool in the rectum with some stranding please correlate for any clinical evidence of stercoral colitis. Normal appendix. Vascular/Lymphatic: Normal caliber aorta and IVC scattered vascular calcifications. Mildly ectatic celiac axis diameter approaching 13 mm. No discrete abnormal lymph node enlargement identified in the abdomen and pelvis. Reproductive: Uterus and bilateral adnexa are unremarkable. Other: No free  air or free fluid. Musculoskeletal: Degenerative changes are seen along the spine and pelvis. There are areas of stenosis along the lumbar spine. IMPRESSION: Punctate nonobstructing upper pole right-sided renal stone. No ureteral stones. Mild bilateral renal atrophy. Stranding and wall thickening in the area of the gallbladder with a stone. As per ultrasound please correlate for clinical evidence of acute cholecystitis. Additional workup with HIDA scan as clinically appropriate. No bowel obstruction or free air. However there is significant stool in the rectum with some adjacent  stranding. Please correlate for any evidence of stercoral colitis Electronically Signed   By: Karen Kays M.D.   On: 10/14/2022 18:18   US ABDOMEN LIMITED RUQ (LIVER/GB)  Result Date: 10/14/2022 CLINICAL DATA:  On and off pain for 1 month. EXAM: ULTRASOUND ABDOMEN LIMITED RIGHT UPPER QUADRANT COMPARISON:  CT abdomen pelvis dated 08/25/2021. FINDINGS: Gallbladder: A 9 mm gallstone is noted. No gallbladder wall thickening visualized. A positive sonographic Eulah Pont sign was noted by sonographer. Common bile duct: Diameter: 5 mm Liver: No focal lesion identified. Within normal limits in parenchymal echogenicity. Portal vein is patent on color Doppler imaging with normal direction of blood flow towards the liver. Other: None. IMPRESSION: Cholelithiasis with a positive sonographic Murphy sign, but no gallbladder wall thickening or pericholecystic fluid. Findings are equivocal for acute cholecystitis. Electronically Signed   By: Romona Curls M.D.   On: 10/14/2022 17:02    Assessment and Plan: This is a 72 y.o. female with acute cholecystitis.  - Patient the patient's symptoms and imaging, she does fit the patient for acute cholecystitis.  She does have multiple medical comorbidities and is on Eliquis at home.  Given this, she is not a good surgical candidate.  Discussed with her and given that her symptoms have been improving today, it may be possible to manage her medically without any procedures.  Her AST and ALT elevation may be related to inflammation from the gallbladder itself but we will keep an eye on this.  If for any reason she were to worsen clinically, it may be that she needs a percutaneous cholecystostomy drain.  As such, would recommend holding her Eliquis still until we know for sure whether she needs any procedures or not.  Would also recommend keeping her n.p.o. today although she can have sips of water or ice chips and continue IV antibiotics. - We will continue following along with you.  I  spent 60 minutes dedicated to the care of this patient on the date of this encounter to include pre-visit review of records, face-to-face time with the patient discussing diagnosis and management, and any post-visit coordination of care.   Howie Ill, MD [facility]:  [phone]

## 2022-10-15 NOTE — Plan of Care (Signed)

## 2022-10-16 DIAGNOSIS — K810 Acute cholecystitis: Secondary | ICD-10-CM | POA: Diagnosis not present

## 2022-10-16 LAB — GLUCOSE, CAPILLARY
Glucose-Capillary: 96 mg/dL (ref 70–99)
Glucose-Capillary: 87 mg/dL (ref 70–99)
Glucose-Capillary: 220 mg/dL — ABNORMAL HIGH (ref 70–99)
Glucose-Capillary: 184 mg/dL — ABNORMAL HIGH (ref 70–99)
Glucose-Capillary: 176 mg/dL — ABNORMAL HIGH (ref 70–99)

## 2022-10-16 LAB — MAGNESIUM: Magnesium: 1.9 mg/dL (ref 1.7–2.4)

## 2022-10-16 LAB — CBC
HCT: 43 % (ref 36.0–46.0)
Hemoglobin: 13.9 g/dL (ref 12.0–15.0)
MCH: 29.5 pg (ref 26.0–34.0)
MCHC: 32.3 g/dL (ref 30.0–36.0)
MCV: 91.3 fL (ref 80.0–100.0)
Platelets: 157 10*3/uL (ref 150–400)
RBC: 4.71 MIL/uL (ref 3.87–5.11)
RDW: 12.7 % (ref 11.5–15.5)
WBC: 7.4 10*3/uL (ref 4.0–10.5)
nRBC: 0 % (ref 0.0–0.2)

## 2022-10-16 LAB — COMPREHENSIVE METABOLIC PANEL
ALT: 76 U/L — ABNORMAL HIGH (ref 0–44)
AST: 86 U/L — ABNORMAL HIGH (ref 15–41)
Albumin: 3.3 g/dL — ABNORMAL LOW (ref 3.5–5.0)
Alkaline Phosphatase: 69 U/L (ref 38–126)
Anion gap: 14 (ref 5–15)
BUN: 40 mg/dL — ABNORMAL HIGH (ref 8–23)
CO2: 26 mmol/L (ref 22–32)
Calcium: 9.2 mg/dL (ref 8.9–10.3)
Chloride: 103 mmol/L (ref 98–111)
Creatinine, Ser: 1.41 mg/dL — ABNORMAL HIGH (ref 0.44–1.00)
GFR, Estimated: 40 mL/min — ABNORMAL LOW (ref >60)
Glucose, Bld: 86 mg/dL (ref 70–99)
Potassium: 3.5 mmol/L (ref 3.5–5.1)
Sodium: 143 mmol/L (ref 135–145)
Total Bilirubin: 1.3 mg/dL — ABNORMAL HIGH (ref 0.3–1.2)
Total Protein: 6.7 g/dL (ref 6.5–8.1)

## 2022-10-16 MED ORDER — DEXTROSE IN LACTATED RINGERS 5 % IV SOLN: INTRAVENOUS | Status: DC

## 2022-10-16 MED ORDER — CHLORHEXIDINE GLUCONATE CLOTH 2 % EX PADS
6.0000 | MEDICATED_PAD | Freq: Every day | CUTANEOUS | Status: DC
  Administered 2022-10-16 – 2022-10-17 (×2): 6 via TOPICAL

## 2022-10-16 MED ORDER — SODIUM CHLORIDE 0.9 % IV SOLN: INTRAVENOUS | Status: DC | PRN

## 2022-10-16 MED ORDER — MUPIROCIN 2 % EX OINT
1.0000 | TOPICAL_OINTMENT | Freq: Two times a day (BID) | CUTANEOUS | Status: DC
  Administered 2022-10-16 – 2022-10-18 (×5): 1 via NASAL
  Filled 2022-10-16: qty 22

## 2022-10-16 NOTE — NC FL2 (Signed)
[place] MEDICAID FL2 LEVEL OF CARE FORM     IDENTIFICATION  Patient Name: [name] Birthdate: 1950/09/28 Sex: female Admission Date (Current Location): 10/14/2022  [facility] and [place] Number:  Chiropodist and Address:         Provider Number: [id number]  Attending Physician Name and Address:  Pennie Banter, DO  Relative Name and Phone Number:       Current Level of Care: Hospital Recommended Level of Care: Nursing Facility Prior Approval Number:    Date Approved/Denied:   PASRR Number: [id number]  Discharge Plan: SNF    Current Diagnoses: Patient Active Problem List   Diagnosis Date Noted   Acute cholecystitis 10/14/2022   Known medical problems 10/14/2022   Chronic respiratory failure with hypoxia 05/04/2022   C. difficile diarrhea 05/01/2022   Generalized weakness 05/01/2022   GERD (gastroesophageal reflux disease) 04/30/2022   Intertriginous candidiasis 04/30/2022   Altered mental status 04/29/2022   Stage 3b chronic kidney disease (CKD) 04/29/2022   Dysphagia 09/06/2021   Chronic systolic CHF (congestive heart failure) 09/03/2021   Hypernatremia 09/03/2021   Obstructive uropathy 08/25/2021   CAD (coronary artery disease)    Diabetes mellitus without complication    Acute renal failure superimposed on stage 3b chronic kidney disease    Paroxysmal atrial fibrillation    Right ureteral stone    Septic shock due to Escherichia coli 11/02/2017   Complete heart block 10/03/2017   Chronic pulmonary embolism 09/17/2017   Wide-complex tachycardia 07/11/2017   Aortic stenosis 05/21/2017   Syncope, near 05/19/2017   Chronic diastolic heart failure 07/27/2016   Hypertension 07/27/2016   Obstructive sleep apnea 07/27/2016   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 07/27/2016   Pressure injury of skin 05/02/2016   Lactic acidosis    Severe aortic stenosis    Morbid obesity    Community acquired pneumonia     Orientation  RESPIRATION BLADDER Height & Weight     Time, Self, Situation, Place  Normal Incontinent Weight: 120 kg Height:   (162.6 cm)  BEHAVIORAL SYMPTOMS/MOOD NEUROLOGICAL BOWEL NUTRITION STATUS      Incontinent Diet (Clears, will advance prior to discharge)  AMBULATORY STATUS COMMUNICATION OF NEEDS Skin   Extensive Assist Verbally Normal                       Personal Care Assistance Level of Assistance              Functional Limitations Info             SPECIAL CARE FACTORS FREQUENCY                       Contractures Contractures Info: Not present    Additional Factors Info  Code Status, Allergies Code Status Info: Full Allergies Info: Tetanus Toxoid, Adsorbed, Tetanus Toxoids, Lisinopril, Niacin, Sitagliptin, Sulfamethoxazole-trimethoprim           Current Medications (10/16/2022):  This is the current hospital active medication list Current Facility-Administered Medications  Medication Dose Route Frequency Provider Last Rate Last Admin   0.9 %  sodium chloride infusion   Intravenous PRN Esaw Grandchild A, DO 10 mL/hr at 10/16/22 0636 Infusion Verify at 10/16/22 0636   acetaminophen (TYLENOL) tablet 650 mg  650 mg Oral Q6H PRN Venora Maples, MD   650 mg at 10/14/22 2127   Or   acetaminophen (TYLENOL) suppository 650 mg  650 mg Rectal  Q6H PRN Venora Maples, MD       atorvastatin (LIPITOR) tablet 40 mg  40 mg Oral q1800 Venora Maples, MD   40 mg at 10/15/22 1757   cefTRIAXone (ROCEPHIN) 2 g in sodium chloride 0.9 % 100 mL IVPB  2 g Intravenous Q24H Venora Maples, MD   Stopped at 10/15/22 1831   Chlorhexidine Gluconate Cloth 2 % PADS 6 each  6 each Topical Daily Esaw Grandchild A, DO   6 each at 10/16/22 1610   clonazePAM (KLONOPIN) tablet 0.5 mg  0.5 mg Oral BID PRN Venora Maples, MD       dextrose 5 % in lactated ringers infusion   Intravenous Continuous Esaw Grandchild A, DO 50 mL/hr at 10/16/22 9604 New Bag at 10/16/22 0907    fenofibrate tablet 160 mg  160 mg Oral Daily Venora Maples, MD   160 mg at 10/16/22 0901   furosemide (LASIX) tablet 20 mg  20 mg Oral Daily Venora Maples, MD   20 mg at 10/16/22 5409   insulin aspart (novoLOG) injection 0-5 Units  0-5 Units Subcutaneous QHS Venora Maples, MD       insulin aspart (novoLOG) injection 0-9 Units  0-9 Units Subcutaneous TID WC Venora Maples, MD       insulin detemir (LEVEMIR) injection 18 Units  18 Units Subcutaneous Daily Venora Maples, MD   18 Units at 10/16/22 0901   metroNIDAZOLE (FLAGYL) IVPB 500 mg  500 mg Intravenous Q12H Venora Maples, MD   Stopped at 10/16/22 0606   mirabegron ER (MYRBETRIQ) tablet 50 mg  50 mg Oral Daily Venora Maples, MD   50 mg at 10/16/22 0901   mupirocin ointment (BACTROBAN) 2 % 1 Application  1 Application Nasal BID Esaw Grandchild A, DO   1 Application at 10/16/22 0905   ondansetron (ZOFRAN) tablet 4 mg  4 mg Oral Q6H PRN Venora Maples, MD       Or   ondansetron [facility]) injection 4 mg  4 mg Intravenous Q6H PRN Venora Maples, MD       oxyCODONE (Oxy IR/ROXICODONE) immediate release tablet 5 mg  5 mg Oral Q4H PRN Venora Maples, MD       pantoprazole (PROTONIX) EC tablet 40 mg  40 mg Oral Daily Venora Maples, MD   40 mg at 10/16/22 0902   polyethylene glycol (MIRALAX / GLYCOLAX) packet 17 g  17 g Oral Daily PRN Venora Maples, MD       sodium chloride flush (NS) 0.9 % injection 3 mL  3 mL Intravenous Q12H Venora Maples, MD   3 mL at 10/16/22 8119   traZODone (DESYREL) tablet 50 mg  50 mg Oral QHS PRN Venora Maples, MD   50 mg at 10/15/22 2120     Discharge Medications: Please see discharge summary for a list of discharge medications.  Relevant Imaging Results:  Relevant Lab Results:   Additional Information SSN: [id number]  Chapman Fitch, RN

## 2022-10-16 NOTE — TOC Initial Note (Signed)
Transition of Care [facility]) - Initial/Assessment Note    Patient Details  Name: [name] MRN: [id number] Date of Birth: August 08, 1950  Transition of Care [facility]) CM/SW Contact:    Chapman Fitch, RN Phone Number: 10/16/2022, 9:11 AM  Clinical Narrative:                  Admitted WUJ:[id number] cholecystitis  Admitted from: [facility]CP: [facility]    Confirmed with Ricky at [facility] that patient is LTC Patient confirms she wishes to return at discharge   Expected Discharge Plan: Long Term Nursing Home Barriers to Discharge: Continued Medical Work up   Patient Goals and CMS Choice            Expected Discharge Plan and Services                                              Prior Living Arrangements/Services     Patient language and need for interpreter reviewed:: Yes Do you feel safe going back to the place where you live?: Yes      Need for Family Participation in Patient Care: Yes (Comment)     Criminal Activity/Legal Involvement Pertinent to Current Situation/Hospitalization: No - Comment as needed  Activities of Daily Living Home Assistive Devices/Equipment: Wheelchair, Eyeglasses ADL Screening (condition at time of admission) Patient's cognitive ability adequate to safely complete daily activities?: Yes Is the patient deaf or have difficulty hearing?: Yes Does the patient have difficulty seeing, even when wearing glasses/contacts?: No Does the patient have difficulty concentrating, remembering, or making decisions?: Yes Patient able to express need for assistance with ADLs?: Yes Does the patient have difficulty dressing or bathing?: Yes Independently performs ADLs?: No Communication: Independent Dressing (OT): Needs assistance Is this a change from baseline?: Pre-admission baseline Grooming: Independent Feeding: Independent Bathing: Needs assistance Is this a change from baseline?: Pre-admission baseline Toileting:  Dependent Is this a change from baseline?: Pre-admission baseline In/Out Bed: Dependent Is this a change from baseline?: Pre-admission baseline Walks in Home: Dependent Is this a change from baseline?: Pre-admission baseline Does the patient have difficulty walking or climbing stairs?: Yes Weakness of Legs: Both Weakness of Arms/Hands: None  Permission Sought/Granted                  Emotional Assessment       Orientation: : Oriented to Self, Oriented to Place, Oriented to  Time, Oriented to Situation      Admission diagnosis:  Acute cholecystitis [K81.0] Cholecystitis [K81.9] Patient Active Problem List   Diagnosis Date Noted   Acute cholecystitis 10/14/2022   Known medical problems 10/14/2022   Chronic respiratory failure with hypoxia 05/04/2022   C. difficile diarrhea 05/01/2022   Generalized weakness 05/01/2022   GERD (gastroesophageal reflux disease) 04/30/2022   Intertriginous candidiasis 04/30/2022   Altered mental status 04/29/2022   Stage 3b chronic kidney disease (CKD) 04/29/2022   Dysphagia 09/06/2021   Chronic systolic CHF (congestive heart failure) 09/03/2021   Hypernatremia 09/03/2021   Obstructive uropathy 08/25/2021   CAD (coronary artery disease)    Diabetes mellitus without complication    Acute renal failure superimposed on stage 3b chronic kidney disease    Paroxysmal atrial fibrillation    Right ureteral stone    Septic shock due to Escherichia coli 11/02/2017   Complete heart block 10/03/2017   Chronic  pulmonary embolism 09/17/2017   Wide-complex tachycardia 07/11/2017   Aortic stenosis 05/21/2017   Syncope, near 05/19/2017   Chronic diastolic heart failure 07/27/2016   Hypertension 07/27/2016   Obstructive sleep apnea 07/27/2016   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 07/27/2016   Pressure injury of skin 05/02/2016   Lactic acidosis    Severe aortic stenosis    Morbid obesity    Community acquired pneumonia    PCP:   [facility]:   [facility] [place], [place] - [address] [place] [address] Phone: [phone] Fax: [phone]     Social Determinants of Health (SDOH) Social History: SDOH Screenings   Food Insecurity: No Food Insecurity (10/14/2022)  Housing: Low Risk  (10/14/2022)  Transportation Needs: No Transportation Needs (10/14/2022)  Utilities: Not At Risk (10/14/2022)  Tobacco Use: Low Risk  (10/14/2022)   SDOH Interventions:     Readmission Risk Interventions    10/16/2022    9:09 AM  Readmission Risk Prevention Plan  Transportation Screening Complete  HRI or Home Care Consult Complete  Palliative Care Screening Not Applicable  Medication Review (RN Care Manager) Complete

## 2022-10-16 NOTE — Progress Notes (Signed)
[facility] PROGRESS NOTE (cpt [phone])  Hospital Day(s): 2.   Interval History: Patient seen and examined, no acute events or new complaints overnight. Patient reports her pain is improved compared to admission. Still with some RUQ pain. No fever, chills, nausea, emesis. She remains without leukocytosis; WBC normal at 7.4K. Hgb remains normal at 13.9. Renal function remains stable; sCr - 1.41; UO - 950 ccs + unmeasured. No significant electrolyte derangements. She is NPO. Continues on Ceftriaxone/Flagyl. She is having bowel function.   Review of Systems:  Constitutional: denies fever, chills  HEENT: denies cough or congestion  Respiratory: denies any shortness of breath  Cardiovascular: denies chest pain or palpitations  Gastrointestinal: + Abdominal pain (RUQ;l improved), denied N/V Genitourinary: denies burning with urination or urinary frequency Musculoskeletal: denies pain, decreased motor or sensation  Vital signs in last 24 hours: [min-max] current  Temp:  [98.2 F (36.8 C)-98.3 F (36.8 C)] 98.3 F (36.8 C) (04/22 0257) Pulse Rate:  [58-62] 62 (04/22 0257) Resp:  [18-20] 20 (04/22 0257) BP: (116-121)/(47-53) 119/47 (04/22 0257) SpO2:  [92 %-96 %] 96 % (04/22 0257)     Height:  (162.6 cm) Weight: 120 kg BMI (Calculated): 45.39   Intake/Output last 2 shifts:  04/21 0701 - 04/22 0700 In: 1276.6 [I.V.:6.6; IV Piggyback:1270] Out: 950 [Urine:950]   Physical Exam:  Constitutional: alert, cooperative and no distress  HENT: normocephalic without obvious abnormality  Eyes: PERRL, EOM's grossly intact and symmetric  Respiratory: breathing non-labored at rest  Cardiovascular: regular rate and sinus rhythm  Gastrointestinal: Obese, soft, improved RUQ soreness, non-distended, no rebound/guarding  Musculoskeletal: no edema or wounds, motor and sensation grossly intact, NT    Labs:     Latest Ref Rng & Units 10/16/2022    4:39 AM 10/15/2022    4:09 AM  10/14/2022    1:39 PM  CBC  WBC 4.0 - 10.5 K/uL 7.4  6.9  6.8   Hemoglobin 12.0 - 15.0 g/dL 60.4  54.0  98.1   Hematocrit 36.0 - 46.0 % 43.0  42.6  44.5   Platelets 150 - 400 K/uL 157  164  162       Latest Ref Rng & Units 10/16/2022    4:39 AM 10/15/2022    4:09 AM 10/14/2022    1:39 PM  CMP  Glucose 70 - 99 mg/dL 86  191  478   BUN 8 - 23 mg/dL 40  43  42   Creatinine 0.44 - 1.00 mg/dL 2.95  6.21  3.08   Sodium 135 - 145 mmol/L 143  141  140   Potassium 3.5 - 5.1 mmol/L 3.5  4.9  3.9   Chloride 98 - 111 mmol/L 103  106  104   CO2 22 - 32 mmol/L Calcium 8.9 - 10.3 mg/dL 9.2  9.3  9.1   Total Protein 6.5 - 8.1 g/dL 6.7  6.6  6.9   Total Bilirubin 0.3 - 1.2 mg/dL 1.3  1.2  1.1   Alkaline Phos 38 - 126 U/L 69  67  57   AST 15 - 41 U/L 86  261  20   ALT 0 - 44 U/L 76  104  16      Imaging studies: No new pertinent imaging studies   Assessment/Plan: (ICD-10's: K81.0) 72 y.o. female with clinically improving acute cholecystitis, complicated by multiple comorbid conditions and need for anticoagulation.    - Will trial CLD today  -  Continue IV Abx  - Will continue to manage this conservatively; If she fails to improve or clinically deteriorates, would need percutaneous cholecystotomy tube placement. She is a sub-optimal candidate for surgical intervention.   - Monitor abdominal examination - Pain control prn; antiemetics prn   - Mobilize as tolerated   - Further management per primary service; we will follow    All of the above findings and recommendations were discussed with the patient, and the medical team, and all of patient's questions were answered to her expressed satisfaction.   -- Lynden Oxford, PA-C [facility] 10/16/2022, 7:20 AM M-F: 7am - 4pm

## 2022-10-16 NOTE — Inpatient Diabetes Management (Signed)
Inpatient Diabetes Program Recommendations  AACE/ADA: New Consensus Statement on Inpatient Glycemic Control (2015)  Target Ranges:  Prepandial:   less than 140 mg/dL      Peak postprandial:   less than 180 mg/dL (1-2 hours)      Critically ill patients:  140 - 180 mg/dL    Latest Reference Range & Units 10/15/22 07:57 10/15/22 11:31 10/15/22 16:59 10/15/22 20:43 10/15/22 21:19  Glucose-Capillary 70 - 99 mg/dL 89 81 79 63 (L) 119 (H)  (L): Data is abnormally low (H): Data is abnormally high  Latest Reference Range & Units 10/16/22 07:34  Glucose-Capillary 70 - 99 mg/dL 87     Admit with: Acute cholecystitis   History: DM2, CKD  Home DM Meds: Levemir 35 units Daily  Current Orders: Levemir 18 units Daily     Novolog Sensitive Correction Scale/ SSI (0-9 units) TID AC + HS     MD- Note Mild Hypoglycemia yest at 9pm   CBG only 87 this AM  Please consider reducing the Levemir to 15 units Daily    --Will follow patient during hospitalization--  Ambrose Finland RN, MSN, CDCES Diabetes Coordinator Inpatient Glycemic Control Team Team Pager: [phone] (8a-5p)

## 2022-10-16 NOTE — Progress Notes (Signed)
Progress Note   Patient: [name] ZOX:[id number] DOB: [birth year] DOA: 10/14/2022     2 DOS: the patient was seen and examined on 10/16/2022   Brief hospital course: HPI on admission 10/14/22 by Dr. Crissie Reese: "[name] is a 72 y.o. female with medical history significant for CHF, morbid obesity, CAD, hypertension, OSA, severe aortic stenosis, paroxysmal A-fib, CKD stage IIIb, paroxysmal A-fib, who presents with abdominal pain.   Patient reports that for the past several weeks she has been having intermittent abdominal pain associated with cold sweats.  Earlier today she had a meal and experienced the same type of pain but much more intense and unremitting.  She let her nurse know this, was evaluated by in-house staff and sent to ED for further evaluation.   In the ED initial vital signs notable only for low normal oxygenation otherwise unremarkable.  CMP with glucose of 190, creatinine improved from usual baseline of 1.6 down to 1.3, LFTs unremarkable.  CBC was normal.  Right upper quadrant ultrasound was obtained which showed positive Murphy sign but no other findings of cholecystitis, overall study was equivocal.  CT abdomen and pelvis was then obtained which showed stranding and thickening of the area of the gallbladder as well as cholelithiasis, as well as incidental nephrolithiasis and generally high burden of stool.  Dr. Aleen Campi from general surgery was consulted and recommended antibiotics and reevaluation in the morning given she is a poor surgical candidate with multiple comorbidities.  She was started on IV ceftriaxone and Flagyl, made n.p.o., and admitted for further management."  4/21 -- seen by general surgery. Felt poor surgical candidate given comorbidities and on Eliquis at home.  Recommend conservative management with IV antibiotics.  May require percutaneous chole drain if clinically worsening. Eliquis on hold.  NPO w/ sips or ice chips for now.  Assessment and Plan: *  Acute cholecystitis Patient hemodynamically stable and without evidence of sepsis.  Medical management for now pending surgery reevaluation in the a.m. - General surgery following, appreciate recommendations.  Poor surgical candidate, medical management. - Continue IV Rocephin & Flagyl - Started on clear liquids today - Maintenance IV fluids  --Pain control and IV antiemetics PRN --Consult IR for perc chole drain if clinically worsening --Hold Eliquis  Known medical problems A-fib- HOLD Eliquis Hyperlipidemia-continue atorvastatin, fenofibrate Anxiety-continue clonazepam as needed CHF-continue Lasix 20 mg daily Type 2 diabetes-reduce home Levemir to 18 units once daily, sliding scale insulin correction OAB-continue Myrbetriq GERD-continue PPI        Subjective: Pt was sitting up in bed having clear liquid breakfast this AM.  She reports RUQ pain is better, but having more lower/mid abdominal discomfort today.  Thinks she may need to have a BM.  No nausea/vomiting or fever/chills.  Physical Exam: Vitals:   10/15/22 1548 10/15/22 1919 10/16/22 0257 10/16/22 0729  BP: (!) 121/53 (!) 116/53 (!) 119/47 (!) 116/57  Pulse: (!) 58 (!) 59 62 (!) 59  Resp: Temp: 98.3 F (36.8 C) 98.2 F (36.8 C) 98.3 F (36.8 C) 98.4 F (36.9 C)  TempSrc:  Oral    SpO2: 92% 96% 96% 95%  Weight:      Height:       General exam: awake & alert, no acute distress, morbidly obese HEENT: moist mucus membranes, hearing grossly normal  Respiratory system: on room air, normal respiratory effort. Cardiovascular system: RRR, intact pedal pulses Gastrointestinal system: soft, NT, ND Central nervous system: no gross focal neurologic  deficits, normal speech Extremities: moves all, no edema, normal tone Skin: dry, intact, normal temperature Psychiatry: normal mood and affect, judgment and insight appear intact   Data Reviewed:  Notable labs --- CMP with Cr 1.41 from 1.33, BUN 40, albumin 3.3,  AST improved 86, ALT improved 76, Tbili 1.3 from 1.2.  Normal CBC   Micro +MRSA nares  Family Communication: None present, will attempt to call as time allows  Disposition: Status is: Inpatient Remains inpatient appropriate because: remains on IV therapies as above and remains NPO   Planned Discharge Destination:  return to ALF/ILF vs rehab    Time spent: 42 minutes  Author: Pennie Banter, DO 10/16/2022 11:27 AM  For on call review [email or web address].

## 2022-10-17 DIAGNOSIS — K810 Acute cholecystitis: Secondary | ICD-10-CM | POA: Diagnosis not present

## 2022-10-17 LAB — COMPREHENSIVE METABOLIC PANEL
ALT: 46 U/L — ABNORMAL HIGH (ref 0–44)
AST: 35 U/L (ref 15–41)
Albumin: 3 g/dL — ABNORMAL LOW (ref 3.5–5.0)
Alkaline Phosphatase: 59 U/L (ref 38–126)
Anion gap: 9 (ref 5–15)
BUN: 36 mg/dL — ABNORMAL HIGH (ref 8–23)
CO2: 27 mmol/L (ref 22–32)
Calcium: 8.7 mg/dL — ABNORMAL LOW (ref 8.9–10.3)
Chloride: 102 mmol/L (ref 98–111)
Creatinine, Ser: 1.29 mg/dL — ABNORMAL HIGH (ref 0.44–1.00)
GFR, Estimated: 44 mL/min — ABNORMAL LOW (ref >60)
Glucose, Bld: 155 mg/dL — ABNORMAL HIGH (ref 70–99)
Potassium: 3.8 mmol/L (ref 3.5–5.1)
Sodium: 138 mmol/L (ref 135–145)
Total Bilirubin: 0.7 mg/dL (ref 0.3–1.2)
Total Protein: 6.1 g/dL — ABNORMAL LOW (ref 6.5–8.1)

## 2022-10-17 LAB — GLUCOSE, CAPILLARY
Glucose-Capillary: 163 mg/dL — ABNORMAL HIGH (ref 70–99)
Glucose-Capillary: 223 mg/dL — ABNORMAL HIGH (ref 70–99)
Glucose-Capillary: 199 mg/dL — ABNORMAL HIGH (ref 70–99)
Glucose-Capillary: 196 mg/dL — ABNORMAL HIGH (ref 70–99)

## 2022-10-17 MED ORDER — AMOXICILLIN-POT CLAVULANATE 875-125 MG PO TABS
1.0000 | ORAL_TABLET | Freq: Two times a day (BID) | ORAL | Status: DC
  Administered 2022-10-17 – 2022-10-18 (×2): 1 via ORAL
  Filled 2022-10-17 (×2): qty 1

## 2022-10-17 MED ORDER — APIXABAN 5 MG PO TABS
5.0000 mg | ORAL_TABLET | Freq: Two times a day (BID) | ORAL | Status: DC
  Administered 2022-10-17 – 2022-10-18 (×2): 5 mg via ORAL
  Filled 2022-10-17 (×2): qty 1

## 2022-10-17 NOTE — Discharge Instructions (Signed)
In addition to included general instructions,  Diet: Recommend avoiding or limiting fatty/greasy foods to help limit risk of recurrence regarding cholecystitis (gallbladder inflammation). Instructions and details provided in handouts.

## 2022-10-17 NOTE — Progress Notes (Signed)
[facility] [facility] PRO[facility]ESS NOTE (cpt [phone])  Hospital Day(s): 3.   Interval History: Patient seen and examined, no acute events or new complaints overnight. Patient reports she is pain free this morning and feeling much better. No fever, chills, nausea, emesis. Renal function remains stable; sCr - 1.29; UO - 1100 ccs + unmeasured. No significant electrolyte derangements. She is on FLD. Continues on Ceftriaxone/Flagyl. She is having bowel function   Review of Systems:  Constitutional: denies fever, chills  HEENT: denies cough or congestion  Respiratory: denies any shortness of breath  Cardiovascular: denies chest pain or palpitations  Gastrointestinal: denied abdominal pain, denied N/V Genitourinary: denies burning with urination or urinary frequency Musculoskeletal: denies pain, decreased motor or sensation  Vital signs in last 24 hours: [min-max] current  Temp:  [97.9 F (36.6 C)-98.2 F (36.8 C)] 98 F (36.7 C) (04/23 0727) Pulse Rate:  [58-60] 60 (04/23 0727) Resp:  [16-18] 16 (04/23 0727) BP: (119-122)/(58-99) 122/99 (04/23 0727) SpO2:  [96 %-98 %] 98 % (04/23 0727)     Height:  (162.6 cm) Weight: 120 kg BMI (Calculated): 45.39   Intake/Output last 2 shifts:  04/22 0701 - 04/23 0700 In: 1439 [P.O.:1320; I.V.:7.5; IV Piggyback:111.5] Out: 1100 [Urine:1100]   Physical Exam:  Constitutional: alert, cooperative and no distress  HENT: normocephalic without obvious abnormality  Eyes: PERRL, EOM's grossly intact and symmetric  Respiratory: breathing non-labored at rest  Cardiovascular: regular rate and sinus rhythm  Gastrointestinal: Obese, soft, non-tender this AM, non-distended, no rebound/guarding  Musculoskeletal: no edema or wounds, motor and sensation grossly intact, NT    Labs:     Latest Ref Rng & Units 10/16/2022    4:39 AM 10/15/2022    4:09 AM 10/14/2022    1:39 PM  CBC  WBC 4.0 - 10.5 K/uL 7.4  6.9  6.8   Hemoglobin 12.0 - 15.0 g/dL 60.4   54.0  98.1   Hematocrit 36.0 - 46.0 % 43.0  42.6  44.5   Platelets 150 - 400 K/uL 157  164  162       Latest Ref Rng & Units 10/17/2022    4:59 AM 10/16/2022    4:39 AM 10/15/2022    4:09 AM  CMP  Glucose 70 - 99 mg/dL 191  86  478   BUN 8 - 23 mg/dL 36  40  43   Creatinine 0.44 - 1.00 mg/dL 2.95  6.21  3.08   Sodium 135 - 145 mmol/L 138  143  141   Potassium 3.5 - 5.1 mmol/L 3.8  3.5  4.9   Chloride 98 - 111 mmol/L 102  103  106   CO2 22 - 32 mmol/L Calcium 8.9 - 10.3 mg/dL 8.7  9.2  9.3   Total Protein 6.5 - 8.1 g/dL 6.1  6.7  6.6   Total Bilirubin 0.3 - 1.2 mg/dL 0.7  1.3  1.2   Alkaline Phos 38 - 126 U/L 59  69  67   AST 15 - 41 U/L 35  86  261   ALT 0 - 44 U/L 46  76  104      Imaging studies: No new pertinent imaging studies   Assessment/Plan: (ICD-10's: K81.0) 72 y.o. female with clinically improving acute cholecystitis, complicated by multiple comorbid conditions and need for anticoagulation.    - Okay to advance diet today; will leave dietary recommendations to avoid recurrence (ie: Low fat diet)  - Continue  IV Abx; Transition to PO at discharge to complete 10 days total (IV + PO)  - Continue to manage this conservatively; If she fails to improve or clinically deteriorates, would need percutaneous cholecystotomy tube placement. She is a sub-optimal candidate for surgical intervention.   - Monitor abdominal examination - Pain control prn; antiemetics prn   - Mobilize as tolerated   - Further management per primary service   - Discharge Planning; Diet advancing, nothing further from surgical perspective. Abx as above. I will update DC instructions regarding diet. She can follow up in 3-4 weeks for recheck   All of the above findings and recommendations were discussed with the patient, and the medical team, and all of patient's questions were answered to her expressed satisfaction.  -- Lynden Oxford, PA-C [facility] 10/17/2022, 7:36  AM M-F: 7am - 4pm

## 2022-10-17 NOTE — Progress Notes (Addendum)
Progress Note   Patient: [name] Brine ZOX:[id number] DOB: 11-30-1950 DOA: 10/14/2022     3 DOS: the patient was seen and examined on 10/17/2022   Brief hospital course: HPI on admission 10/14/22 by Dr. Crissie Reese: "Laterica Matarazzo is a 72 y.o. female with medical history significant for CHF, morbid obesity, CAD, hypertension, OSA, severe aortic stenosis, paroxysmal A-fib, CKD stage IIIb, paroxysmal A-fib, who presents with abdominal pain.   Patient reports that for the past several weeks she has been having intermittent abdominal pain associated with cold sweats.  Earlier today she had a meal and experienced the same type of pain but much more intense and unremitting.  She let her nurse know this, was evaluated by in-house staff and sent to ED for further evaluation.   In the ED initial vital signs notable only for low normal oxygenation otherwise unremarkable.  CMP with glucose of 190, creatinine improved from usual baseline of 1.6 down to 1.3, LFTs unremarkable.  CBC was normal.  Right upper quadrant ultrasound was obtained which showed positive Murphy sign but no other findings of cholecystitis, overall study was equivocal.  CT abdomen and pelvis was then obtained which showed stranding and thickening of the area of the gallbladder as well as cholelithiasis, as well as incidental nephrolithiasis and generally high burden of stool.  Dr. Aleen Campi from general surgery was consulted and recommended antibiotics and reevaluation in the morning given she is a poor surgical candidate with multiple comorbidities.  She was started on IV ceftriaxone and Flagyl, made n.p.o., and admitted for further management."  4/21 -- seen by general surgery. Felt poor surgical candidate given comorbidities and on Eliquis at home.  Recommend conservative management with IV antibiotics.  May require percutaneous chole drain if clinically worsening. Eliquis on hold.  NPO w/ sips or ice chips for now.  Assessment and Plan: *  Acute cholecystitis Patient hemodynamically stable and without evidence of sepsis.  Medical management for now pending surgery reevaluation in the a.m. - General surgery following, appreciate recommendations.  Poor surgical candidate, medical management. - Transition IV Rocephin & Flagyl >> PO Augmentin to complete total 10 day course - On clear liquids, surgery to advance to soft later today - Off IV fluids  --Pain control and IV antiemetics PRN --Consult IR for perc chole drain if clinically worsening --Resume Eliquis  Known medical problems A-fib- Eliquis was held, resume Hyperlipidemia-continue atorvastatin, fenofibrate Anxiety-continue clonazepam as needed CHF-continue Lasix 20 mg daily Type 2 diabetes-reduce home Levemir to 18 units once daily, sliding scale insulin correction OAB-continue Myrbetriq GERD-continue PPI        Subjective: Pt was sitting up in bed when seen this AM.  Reports doing well.  RUQ pain feels resolved.  She does report some intermittent RLQ discomfort, but no N/V or F/C. Declines PT/OT.     Physical Exam: Vitals:   10/16/22 1810 10/16/22 2129 10/17/22 0344 10/17/22 0727  BP: 122/63 121/78 (!) 119/58 (!) 122/99  Pulse: (!) 59 (!) 59 (!) 58 60  Resp: Temp: 98.1 F (36.7 C) 98.2 F (36.8 C) 97.9 F (36.6 C) 98 F (36.7 C)  TempSrc: Oral Oral Oral Oral  SpO2: 98% 97% 96% 98%  Weight:      Height:       General exam: awake & alert, no acute distress, morbidly obese HEENT: moist mucus membranes, hearing grossly normal  Respiratory system: on room air, normal respiratory effort. Cardiovascular system: RRR, intact pedal pulses Gastrointestinal  system: soft, NT, ND Central nervous system: no gross focal neurologic deficits, normal speech Extremities: moves all, no edema, normal tone Skin: dry, intact, normal temperature Psychiatry: normal mood and affect, judgment and insight appear intact   Data Reviewed:  Notable labs --- CMP  with Cr 1.29 improved, glucose 155, BUN 36, Ca 8.7, albumin 3.0, ALT 46 improved, AST noramlized, Tprotein 6.1, Tbili normalized 1.3 >> 0.7   Micro +MRSA nares  Family Communication: None present, will attempt to call as time allows  Disposition: Status is: Inpatient Remains inpatient appropriate because: remains on IV therapies as above, advancing diet. Anticipate possible d/c tomorrow pending clinical course.   Planned Discharge Destination:  return to ALF/ILF vs rehab    Time spent: 35 minutes  Author: Pennie Banter, DO 10/17/2022 12:20 PM  For on call review [email or web address].

## 2022-10-18 DIAGNOSIS — K810 Acute cholecystitis: Secondary | ICD-10-CM | POA: Diagnosis not present

## 2022-10-18 LAB — BASIC METABOLIC PANEL
Anion gap: 11 (ref 5–15)
BUN: 33 mg/dL — ABNORMAL HIGH (ref 8–23)
CO2: 24 mmol/L (ref 22–32)
Calcium: 8.6 mg/dL — ABNORMAL LOW (ref 8.9–10.3)
Chloride: 101 mmol/L (ref 98–111)
Creatinine, Ser: 1.28 mg/dL — ABNORMAL HIGH (ref 0.44–1.00)
GFR, Estimated: 45 mL/min — ABNORMAL LOW (ref >60)
Glucose, Bld: 166 mg/dL — ABNORMAL HIGH (ref 70–99)
Potassium: 3.6 mmol/L (ref 3.5–5.1)
Sodium: 136 mmol/L (ref 135–145)

## 2022-10-18 LAB — GLUCOSE, CAPILLARY
Glucose-Capillary: 172 mg/dL — ABNORMAL HIGH (ref 70–99)
Glucose-Capillary: 249 mg/dL — ABNORMAL HIGH (ref 70–99)

## 2022-10-18 MED ORDER — AMOXICILLIN-POT CLAVULANATE 875-125 MG PO TABS: 1.0000 | ORAL_TABLET | Freq: Two times a day (BID) | ORAL | 0 refills | Status: AC

## 2022-10-18 NOTE — Care Management Important Message (Signed)
Important Message  Patient Details  Name: [name] MRN: [id number] Date of Birth: [birth year]   Medicare Important Message Given:  Yes  Reviewed Medicare IM with patient via room phone [phone]).  Attempted to reach Deniece Ree, cousin, per patient's request to review information and determine if a copy of the Medicare IM would be needed.   Message left for Raynelle Fanning, encouraged callback.     Johnell Comings 10/18/2022, 11:12 AM

## 2022-10-18 NOTE — TOC Transition Note (Signed)
Transition of Care [facility]) - CM/SW Discharge Note   Patient Details  Name: Feleshia Zundel MRN: [id number] Date of Birth: Nov 01, 1950  Transition of Care [facility]) CM/SW Contact:  Chapman Fitch, RN Phone Number: 10/18/2022, 1:44 PM   Clinical Narrative:     Patient will DC [id number] Anticipated DC date: 10/18/22  Family notified: Raynelle Fanning Transport by: Wendie Simmer  Per MD patient ready for DC to . RN, patient, patient's family, and facility notified of DC. Discharge Summary sent to facility. RN given number for report. DC packet on chart. Ambulance transport requested for patient.  TOC signing off.  Bevelyn Ngo [facility] [phone]     Barriers to Discharge: Continued Medical Work up   Patient Goals and CMS Choice      Discharge Placement                         Discharge Plan and Services Additional resources added to the After Visit Summary for                                       Social Determinants of Health (SDOH) Interventions SDOH Screenings   Food Insecurity: No Food Insecurity (10/14/2022)  Housing: Low Risk  (10/14/2022)  Transportation Needs: No Transportation Needs (10/14/2022)  Utilities: Not At Risk (10/14/2022)  Tobacco Use: Low Risk  (10/14/2022)     Readmission Risk Interventions    10/16/2022    9:09 AM  Readmission Risk Prevention Plan  Transportation Screening Complete  HRI or Home Care Consult Complete  Palliative Care Screening Not Applicable  Medication Review (RN Care Manager) Complete

## 2022-10-18 NOTE — Discharge Summary (Signed)
Physician Discharge Summary   Patient: [name] MRN: [id number] DOB: 08-29-1950  Admit date:     10/14/2022  Discharge date: 10/18/22  Discharge Physician: Loyce Dys   PCP: [facility]    Discharge Diagnoses: Acute cholecystitis Patient hemodynamically stable and without evidence of sepsis. Chronic persistent atrial fibrillation Hyperlipidemia Anxiety Chronic systolic CHF Type 2 diabetes GERD    Hospital Course: [name] is a 72 y.o. female with medical history significant for CHF, morbid obesity, CAD, hypertension, OSA, severe aortic stenosis, paroxysmal A-fib, CKD stage IIIb, paroxysmal A-fib, who presents with abdominal pain.   Patient reports that for the past several weeks she has been having intermittent abdominal pain associated with cold sweats.  Did do a presentation she had a meal and experienced the same type of pain but much more intense and unremitting.  Patient subsequently presented to the emergency room for further management. In the ED CMP with glucose of 190, creatinine improved from usual baseline of 1.6 down to 1.3, LFTs unremarkable.  CBC was normal.  Right upper quadrant ultrasound was obtained which showed positive Murphy sign but no other findings of cholecystitis, overall study was equivocal.  CT abdomen and pelvis was then obtained which showed stranding and thickening of the area of the gallbladder as well as cholelithiasis, as well as incidental nephrolithiasis and generally high burden of stool.  Dr. Aleen Campi from general surgery was consulted and recommended antibiotics and conservative management.  Also surgeon felt patient was poor surgical candidate given comorbidities and on Eliquis at home.  Recommend conservative management with IV antibiotics.  The plan was outpatient may require percutaneous chole drain if clinically worsening. Patient however improved and has been cleared by surgeon for discharge today and to follow-up  as an outpatient.  Will complete a total of 10 days of antibiotics   Consultants: As above Procedures performed: None Disposition: Home Diet recommendation:  Discharge Diet Orders (From admission, onward)     Start     Ordered   10/18/22 0000  Diet - low sodium heart healthy        10/18/22 1137           Regular diet DISCHARGE MEDICATION: Allergies as of 10/18/2022       Reactions   Tetanus Toxoid, Adsorbed Swelling   Tetanus Toxoids Swelling   Lisinopril Rash   Other reaction(s): Unknown   Niacin Rash   Other reaction(s): Unknown   Sitagliptin Rash   Other reaction(s): Unknown   Sulfamethoxazole-trimethoprim Rash   Other reaction(s): Unknown        Medication List     STOP taking these medications    clonazePAM 0.5 MG tablet Commonly known as: KLONOPIN   multivitamin with minerals Tabs tablet   oxyCODONE 5 MG immediate release tablet Commonly known as: Oxy IR/ROXICODONE       TAKE these medications    acetaminophen 325 MG tablet Commonly known as: TYLENOL Take 2 tablets (650 mg total) by mouth every 6 (six) hours as needed for mild pain (or Fever >/= 101). What changed:  how much to take when to take this additional instructions   amoxicillin-clavulanate 875-125 MG tablet Commonly known as: AUGMENTIN Take 1 tablet by mouth every 12 (twelve) hours for 7 days.   apixaban 5 MG Tabs tablet Commonly known as: ELIQUIS Take 1 tablet (5 mg total) by mouth 2 (two) times daily.   atorvastatin 40 MG tablet Commonly known as: LIPITOR Take 1 tablet (40 mg  total) by mouth daily at 6 PM.   barrier cream Crea Commonly known as: non-specified Apply 1 application. topically 3 (three) times daily. Every shift   Cholecalciferol 50 MCG (2000 UT) Caps Take 1 capsule (2,000 Units total) by mouth daily.   docusate sodium 100 MG capsule Commonly known as: COLACE Take 1 capsule (100 mg total) by mouth 2 (two) times daily as needed for mild constipation.    feeding supplement (PRO-STAT SUGAR FREE 64) Liqd Take 30 mLs by mouth 2 (two) times daily.   fenofibrate 145 MG tablet Commonly known as: TRICOR Take 1 tablet (145 mg total) by mouth daily.   furosemide 20 MG tablet Commonly known as: LASIX Take 20 mg by mouth daily.   gabapentin 100 MG capsule Commonly known as: NEURONTIN Take 100 mg by mouth daily.   L.E.T. 4-0.05-0.5 % Gel Generic drug: lido-EPINEPHrine-Tetracaine Apply 1 mL topically every hour as needed (leg ulcer pain).   Levemir FlexPen 100 UNIT/ML FlexPen Generic drug: insulin detemir Inject 35 Units into the skin daily.   LORazepam 0.5 MG tablet Commonly known as: ATIVAN Take 0.5 mg by mouth every 8 (eight) hours as needed for anxiety.   Myrbetriq 50 MG Tb24 tablet Generic drug: mirabegron ER Take 50 mg by mouth daily.   nystatin powder Commonly known as: MYCOSTATIN/NYSTOP SMARTSIG:1 Application Topical 2-3 Times Daily   omeprazole 20 MG capsule Commonly known as: PRILOSEC Take 20 mg by mouth daily.        Follow-up Information     Piscoya, Elita Quick, MD. Schedule an appointment as soon as possible for a visit in 4 week(s).   Specialty: General Surgery Why: Hospital follow up; cholecystitis Contact information: [address] [place] [address] [phone]                Discharge Exam: Ceasar Mons Weights   10/14/22 1338 10/14/22 2117  Weight: 136.1 kg 120 kg   General exam: awake & alert, no acute distress HEENT: moist mucus membranes Respiratory system: on room air, normal respiratory effort. Cardiovascular system: RRR, intact pedal pulses Gastrointestinal system: soft, NT, ND Central nervous system: no gross focal neurologic deficits Extremities: moves all, no edema, normal tone Skin: dry, intact, normal temperature Psychiatry: normal mood and affect    Condition at discharge: good Discharge time spent: greater than 30 minutes.  Signed: Loyce Dys, MD [facility] 10/18/2022

## 2022-10-18 NOTE — Progress Notes (Addendum)
[facility] SURGICAL PROGRESS NOTE (cpt [phone])  Hospital Day(s): 4.   Interval History: Patient seen and examined, no acute events or new complaints overnight. Patient reports she is doing well; no abdominal pain. No fever, chills, nausea, emesis. Renal function remains stable; sCr - 1.28; UO - 2250 ccs + unmeasured. No significant electrolyte derangements. She is on soft diet. Now on Augmentin. She is having bowel function   Review of Systems:  Constitutional: denies fever, chills  HEENT: denies cough or congestion  Respiratory: denies any shortness of breath  Cardiovascular: denies chest pain or palpitations  Gastrointestinal: denied abdominal pain, denied N/V Genitourinary: denies burning with urination or urinary frequency Musculoskeletal: denies pain, decreased motor or sensation  Vital signs in last 24 hours: [min-max] current  Temp:  [98 F (36.7 C)-98.3 F (36.8 C)] 98 F (36.7 C) (04/24 0410) Pulse Rate:  [60-99] 60 (04/24 0410) Resp:  [16-18] 18 (04/24 0410) BP: (122-144)/(57-99) 125/69 (04/24 0410) SpO2:  [95 %-98 %] 95 % (04/24 0410)     Height:  (162.6 cm) Weight: 120 kg BMI (Calculated): 45.39   Intake/Output last 2 shifts:  04/23 0701 - 04/24 0700 In: 410 [P.O.:360; IV Piggyback:50] Out: 2250 [Urine:2250]   Physical Exam:  Constitutional: alert, cooperative and no distress  HENT: normocephalic without obvious abnormality  Eyes: PERRL, EOM's grossly intact and symmetric  Respiratory: breathing non-labored at rest  Cardiovascular: regular rate and sinus rhythm  Gastrointestinal: Obese, soft, non-tender this AM, non-distended, no rebound/guarding  Musculoskeletal: no edema or wounds, motor and sensation grossly intact, NT    Labs:     Latest Ref Rng & Units 10/16/2022    4:39 AM 10/15/2022    4:09 AM 10/14/2022    1:39 PM  CBC  WBC 4.0 - 10.5 K/uL 7.4  6.9  6.8   Hemoglobin 12.0 - 15.0 g/dL 13.2  44.0  10.2   Hematocrit 36.0 - 46.0 %  43.0  42.6  44.5   Platelets 150 - 400 K/uL 157  164  162       Latest Ref Rng & Units 10/18/2022    4:25 AM 10/17/2022    4:59 AM 10/16/2022    4:39 AM  CMP  Glucose 70 - 99 mg/dL 725  366  86   BUN 8 - 23 mg/dL 33  36  40   Creatinine 0.44 - 1.00 mg/dL 4.40  3.47  4.25   Sodium 135 - 145 mmol/L 136  138  143   Potassium 3.5 - 5.1 mmol/L 3.6  3.8  3.5   Chloride 98 - 111 mmol/L 101  102  103   CO2 22 - 32 mmol/L Calcium 8.9 - 10.3 mg/dL 8.6  8.7  9.2   Total Protein 6.5 - 8.1 g/dL  6.1  6.7   Total Bilirubin 0.3 - 1.2 mg/dL  0.7  1.3   Alkaline Phos 38 - 126 U/L  59  69   AST 15 - 41 U/L  35  86   ALT 0 - 44 U/L  46  76      Imaging studies: No new pertinent imaging studies   Assessment/Plan: (ICD-10's: K81.0) 72 y.o. female with clinically improving acute cholecystitis, complicated by multiple comorbid conditions and need for anticoagulation.    - Okay to continue regular diet; left dietary recommendations to avoid recurrence (ie: Low fat diet)  - Continue Augmentin (Augmentin); Complete 10 days total (IV + PO)  -  Monitor abdominal examination - Pain control prn; antiemetics prn   - Mobilize as tolerated   - Further management per primary service   - Discharge Planning; Nothing further from surgical perspective. Abx as above. I will update DC instructions regarding diet. She can follow up in 3-4 weeks for recheck   All of the above findings and recommendations were discussed with the patient, and the medical team, and all of patient's questions were answered to her expressed satisfaction.  -- Lynden Oxford, PA-C  [facility] 10/18/2022, 7:21 AM M-F: 7am - 4pm

## 2022-11-10 ENCOUNTER — Ambulatory Visit: Payer: Medicare Other | Admitting: Surgery

## 2022-11-27 ENCOUNTER — Encounter: Payer: Self-pay | Admitting: Surgery

## 2022-11-27 ENCOUNTER — Ambulatory Visit (INDEPENDENT_AMBULATORY_CARE_PROVIDER_SITE_OTHER): Payer: Medicare Other | Admitting: Surgery

## 2022-11-27 VITALS — BP 133/80 | HR 66 | Temp 98.0°F

## 2022-11-27 DIAGNOSIS — K810 Acute cholecystitis: Secondary | ICD-10-CM

## 2022-11-27 NOTE — Progress Notes (Signed)
11/27/2022  History of Present Illness: [name] is a 72 y.o. female presenting for follow up of acute cholecystitis.  She was initially admitted on 10/14/22 with a one day history of RUQ pain associated with nausea and vomiting.  Her workup on admission showed normal LFTs and normal WBC.  She did have an ultrasound of the RUQ which showed cholelithiasis.  A CT scan of her abdomen/pelvis showed wall thickening and stranding in the area of the gallbladder.  She was started on antibiotics for possible cholecystitis.  Given her medical comorbidities and being on Eliquis, no sugery was done at the time.  Also, her clinical picture improved and no percutaneous cholecystostomy drain was needed. She was discharged on 10/18/22.    Of note, the patient has multiple medical comorbidities.  She is mostly bed bound or on a wheelchair and does not ambulate.  She has CKD, CAD, DM, CHF, morbid obesity, OSA, and is also s/p TAVR and pacemaker placement.  She's currently on Eliquis.    Since her discharge, she reports that she's been doing well.  Denies any abdominal symptoms and particularly denies any RUQ pain, nausea or emesis.  She's tolerating a regular diet.  She's working with her therapies.  Past Medical History: Past Medical History:  Diagnosis Date   Acute kidney failure (HCC)    Acute metabolic encephalopathy    Acute respiratory failure with hypoxia (HCC) 09/2021   Bacteremia    CAD (coronary artery disease)    Chronic kidney disease    Diabetes mellitus without complication (HCC)    Diastolic CHF (HCC)    Dysphagia    Dysrhythmia    Elevated troponin 04/29/2022   History of kidney stones    Hyperlipemia    Hypertension    Hypoglycemia 05/02/2022   Lymphedema    MI (myocardial infarction) (HCC)    Morbid obesity (HCC)    OSA (obstructive sleep apnea)    Presence of permanent cardiac pacemaker    Sepsis due to Escherichia coli with acute hypoxic respiratory failure (HCC)    Severe  aortic stenosis    UTI (urinary tract infection) 04/29/2022   Wounds, multiple      Past Surgical History: Past Surgical History:  Procedure Laterality Date   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   CYSTOSCOPY W/ URETERAL STENT PLACEMENT  08/25/2021   Procedure: CYSTOSCOPY WITH RETROGRADE PYELOGRAM/URETERAL STENT PLACEMENT;  Surgeon: Sondra Come, MD;  Location: [facility] ORS;  Service: Urology;;   CYSTOSCOPY/URETEROSCOPY/HOLMIUM LASER/STENT PLACEMENT Bilateral 10/21/2021   Procedure: CYSTOSCOPY/URETEROSCOPY/HOLMIUM LASER/STENT PLACEMENT;  Surgeon: Sondra Come, MD;  Location: [facility] ORS;  Service: Urology;  Laterality: Bilateral;   none     PACEMAKER INSERTION      Home Medications: Prior to Admission medications   Medication Sig Start Date End Date Taking? Authorizing Provider  acetaminophen (TYLENOL) 325 MG tablet Take 2 tablets (650 mg total) by mouth every 6 (six) hours as needed for mild pain (or Fever >/= 101). Patient taking differently: Take 650-975 mg by mouth See admin instructions. Take 975 mg at bedtime, may take 650 mg every 6 hours as needed for pain 11/05/17   Alford Highland, MD  Amino Acids-Protein Hydrolys (FEEDING SUPPLEMENT, PRO-STAT SUGAR FREE 64,) LIQD Take 30 mLs by mouth 2 (two) times daily.    [provider]  apixaban (ELIQUIS) 5 MG TABS tablet Take 1 tablet (5 mg total) by mouth 2 (two) times daily. 09/06/21   Hollice Espy, MD  atorvastatin (  LIPITOR) 40 MG tablet Take 1 tablet (40 mg total) by mouth daily at 6 PM. 09/06/21   Hollice Espy, MD  barrier cream (NON-SPECIFIED) CREA Apply 1 application. topically 3 (three) times daily. Every shift    [provider]  Cholecalciferol 50 MCG (2000 UT) CAPS Take 1 capsule (2,000 Units total) by mouth daily. 09/06/21   Hollice Espy, MD  docusate sodium (COLACE) 100 MG capsule Take 1 capsule (100 mg total) by mouth 2 (two) times daily as needed for mild constipation. 05/10/22   Pennie Banter,  DO  fenofibrate (TRICOR) 145 MG tablet Take 1 tablet (145 mg total) by mouth daily. 09/06/21   Hollice Espy, MD  furosemide (LASIX) 20 MG tablet Take 20 mg by mouth daily. 04/09/22   [provider]  gabapentin (NEURONTIN) 100 MG capsule Take 100 mg by mouth daily. 10/09/22   [provider]  LEVEMIR FLEXPEN 100 UNIT/ML FlexPen Inject 35 Units into the skin daily. 05/10/22   Esaw Grandchild A, DO  lido-EPINEPHrine-Tetracaine (L.E.T.) 4-0.05-0.5 % GEL Apply 1 mL topically every hour as needed (leg ulcer pain). 03/23/22   Merwyn Katos, MD  LORazepam (ATIVAN) 0.5 MG tablet Take 0.5 mg by mouth every 8 (eight) hours as needed for anxiety.    [provider]  MYRBETRIQ 50 MG TB24 tablet Take 50 mg by mouth daily. 04/09/22   [provider]  nystatin (MYCOSTATIN/NYSTOP) powder SMARTSIG:1 Application Topical 2-3 Times Daily 03/01/22   [provider]  omeprazole (PRILOSEC) 20 MG capsule Take 20 mg by mouth daily. 04/09/22   [provider]    Allergies: Allergies  Allergen Reactions   Tetanus Toxoid, Adsorbed Swelling   Tetanus Toxoids Swelling   Lisinopril Rash    Other reaction(s): Unknown   Niacin Rash    Other reaction(s): Unknown   Sitagliptin Rash    Other reaction(s): Unknown   Sulfamethoxazole-Trimethoprim Rash    Other reaction(s): Unknown    Review of Systems: Review of Systems  Constitutional:  Negative for chills and fever.  Respiratory:  Negative for shortness of breath.   Cardiovascular:  Negative for chest pain.  Gastrointestinal:  Negative for abdominal pain, nausea and vomiting.    Physical Exam BP 133/80   Pulse 66   Temp 98 F (36.7 C)   SpO2 97%  CONSTITUTIONAL: No acute distress HEENT:  Normocephalic, atraumatic, extraocular motion intact. RESPIRATORY:  Lungs are clear, and breath sounds are equal bilaterally. Normal respiratory effort without pathologic use of accessory muscles. CARDIOVASCULAR: Heart  is regular without murmurs, gallops, or rubs. GI: The abdomen is soft, obese, non-distended, non-tender to palpation.  Negative Murphy's sign.  NEUROLOGIC:  Motor and sensation is grossly normal.  Cranial nerves are grossly intact. PSYCH:  Alert and oriented to person, place and time. Affect is normal.  Labs/Imaging: Ultrasound RUQ on 10/14/22: IMPRESSION: Cholelithiasis with a positive sonographic Murphy sign, but no gallbladder wall thickening or pericholecystic fluid. Findings are equivocal for acute cholecystitis.  CT abdomen/pelvis 10/14/22: IMPRESSION: Punctate nonobstructing upper pole right-sided renal stone. No ureteral stones. Mild bilateral renal atrophy.   Stranding and wall thickening in the area of the gallbladder with a stone. As per ultrasound please correlate for clinical evidence of acute cholecystitis. Additional workup with HIDA scan as clinically appropriate.   No bowel obstruction or free air. However there is significant stool in the rectum with some adjacent stranding. Please correlate for any evidence of stercoral colitis  Assessment and Plan: This  is a 72 y.o. female with acute cholecystitis.  --The patient has been doing well since her discharge from [facility] on 10/18/22.  She's tolerating a regular diet without any discomfort or pain issues.  Discussed with her that since she's been doing so well and given her medical comorbidities, I think it would be more prudent to continue monitoring her symptoms and watchful waiting.  If she were to start having more episodes of biliary colic or if her pain were worsening, then we would have to discuss surgery, knowing that there would be some increased operative risks. --Would recommend a low fat diet to decrease the trigger/stimulus to the gallbladder. --Follow up as needed.  I spent 20 minutes dedicated to the care of this patient on the date of this encounter to include pre-visit review of records, face-to-face time with  the patient discussing diagnosis and management, and any post-visit coordination of care.   Howie Ill, MD [facility]

## 2022-11-27 NOTE — Patient Instructions (Signed)
Try eating a lower fat diet to prevent any future gallbladder issues.   Follow-up with our office as needed.  Please call and ask to speak with a nurse if you develop questions or concerns.   Gallbladder Eating Plan High blood cholesterol, obesity, a sedentary lifestyle, an unhealthy diet, and diabetes are risk factors for developing gallstones. If you have a gallbladder condition, you may have trouble digesting fats and tolerating high fat intake. Eating a low-fat diet can help reduce your symptoms and may be helpful before and after having surgery to remove your gallbladder (cholecystectomy). Your health care provider may recommend that you work with a dietitian to help you reduce the amount of fat in your diet. What are tips for following this plan? General guidelines Limit your fat intake to less than 30% of your total daily calories. If you eat around 1,800 calories each day, this means eating less than 60 grams (g) of fat per day. Fat is an important part of a healthy diet. Eating a low-fat diet can make it hard to maintain a healthy body weight. Ask your dietitian how much fat, calories, and other nutrients you need each day. Eat small, frequent meals throughout the day instead of three large meals. Drink at least 8-10 cups (1.9-2.4 L) of fluid a day. Drink enough fluid to keep your urine pale yellow. If you drink alcohol: Limit how much you have to: 0-1 drink a day for women who are not pregnant. 0-2 drinks a day for men. Know how much alcohol is in a drink. In the [place] one drink equals one 12 oz bottle of beer (355 mL), one 5 oz glass of wine (148 mL), or one 1 oz glass of hard liquor (44 mL). Reading food labels  Check nutrition facts on food labels for the amount of fat per serving. Choose foods with less than 3 grams of fat per serving. Shopping Choose nonfat and low-fat healthy foods. Look for the words "nonfat," "low-fat," or "fat-free." Avoid buying processed or prepackaged  foods. Cooking Cook using low-fat methods, such as baking, broiling, grilling, or boiling. Cook with small amounts of healthy fats, such as olive oil, grapeseed oil, canola oil, avocado oil, or sunflower oil. What foods are recommended?  All fresh, frozen, or canned fruits and vegetables. Whole grains. Low-fat or nonfat (skim) milk and yogurt. Lean meat, skinless poultry, fish, eggs, and beans. Low-fat protein supplement powders or drinks. Spices and herbs. The items listed above may not be a complete list of foods and beverages you can eat and drink. Contact a dietitian for more information. What foods are not recommended? High-fat foods. These include baked goods, fast food, fatty cuts of meat, ice cream, french toast, sweet rolls, pizza, cheese bread, foods covered with butter, creamy sauces, or cheese. Fried foods. These include french fries, tempura, battered fish, breaded chicken, fried breads, and sweets. Foods that cause bloating and gas. The items listed above may not be a complete list of foods that you should avoid. Contact a dietitian for more information. Summary A low-fat diet can be helpful if you have a gallbladder condition, or before and after gallbladder surgery. Limit your fat intake to less than 30% of your total daily calories. This is about 60 g of fat if you eat 1,800 calories each day. Eat small, frequent meals throughout the day instead of three large meals. This information is not intended to replace advice given to you by your health care provider. Make sure you discuss  any questions you have with your health care provider. Document Revised: 05/27/2021 Document Reviewed: 05/27/2021 Elsevier Patient Education  2024 [employer].

## 2023-05-29 ENCOUNTER — Encounter (INDEPENDENT_AMBULATORY_CARE_PROVIDER_SITE_OTHER): Payer: Self-pay | Admitting: Nurse Practitioner

## 2023-05-29 ENCOUNTER — Ambulatory Visit (INDEPENDENT_AMBULATORY_CARE_PROVIDER_SITE_OTHER): Payer: Medicare Other | Admitting: Nurse Practitioner

## 2023-05-29 VITALS — BP 127/82 | HR 78 | Resp 16

## 2023-05-29 DIAGNOSIS — I10 Essential (primary) hypertension: Secondary | ICD-10-CM | POA: Diagnosis not present

## 2023-05-29 DIAGNOSIS — I872 Venous insufficiency (chronic) (peripheral): Secondary | ICD-10-CM | POA: Diagnosis not present

## 2023-05-29 DIAGNOSIS — L819 Disorder of pigmentation, unspecified: Secondary | ICD-10-CM

## 2023-05-29 DIAGNOSIS — R21 Rash and other nonspecific skin eruption: Secondary | ICD-10-CM

## 2023-06-02 ENCOUNTER — Encounter (INDEPENDENT_AMBULATORY_CARE_PROVIDER_SITE_OTHER): Payer: Self-pay | Admitting: Nurse Practitioner

## 2023-06-02 NOTE — Progress Notes (Signed)
Subjective:    Patient ID: [name], female    DOB: [birth year], 72 y.o.   MRN: [id number] Chief Complaint  Patient presents with   New Patient (Initial Visit)    Ref Slade-hartman consult edema/temp change both feet    Patient presents today for evaluation after being seen by podiatrist recently and it there are some concerns for possible vascular issues.  She notes her largest concern is with the discoloration of her lower extremities as well as her lower extremity swelling.  Today her swelling is actually under very good control but she has previously been in wraps to help control her swelling.  She is not extremely active.  Currently there are no open wounds or ulcerations.  The rash discoloration on her feet some components of what appear to be a possible vasculitis but it also goes from her lower extremities up to her abdomen.  In addition she has some significant stasis dermatitis noted bilaterally.  Currently there are no open wounds or ulcerations but she notes that she has had them previously.    Review of Systems  Cardiovascular:  Positive for leg swelling.  Musculoskeletal:  Positive for gait problem.  Skin:  Positive for color change and wound.  All other systems reviewed and are negative.      Objective:   Physical Exam Vitals reviewed.  HENT:     Head: Normocephalic.  Cardiovascular:     Rate and Rhythm: Normal rate.  Pulmonary:     Effort: Pulmonary effort is normal.  Musculoskeletal:     Right lower leg: Edema present.     Left lower leg: Edema present.  Skin:    General: Skin is warm and dry.     Capillary Refill: Capillary refill takes 2 to 3 seconds.  Neurological:     Mental Status: She is alert and oriented to person, place, and time.     Motor: Weakness present.     Gait: Gait abnormal.  Psychiatric:        Mood and Affect: Mood normal.        Behavior: Behavior normal.        Thought Content: Thought content normal.        Judgment:  Judgment normal.     BP 127/82 (BP Location: Right Arm)   Pulse 78   Resp 16   Past Medical History:  Diagnosis Date   Acute kidney failure (HCC)    Acute metabolic encephalopathy    Acute respiratory failure with hypoxia (HCC) 09/2021   Bacteremia    CAD (coronary artery disease)    Chronic kidney disease    Diabetes mellitus without complication (HCC)    Diastolic CHF (HCC)    Dysphagia    Dysrhythmia    Elevated troponin 04/29/2022   History of kidney stones    Hyperlipemia    Hypertension    Hypoglycemia 05/02/2022   Lymphedema    MI (myocardial infarction) (HCC)    Morbid obesity (HCC)    OSA (obstructive sleep apnea)    Presence of permanent cardiac pacemaker    Sepsis due to Escherichia coli with acute hypoxic respiratory failure (HCC)    Severe aortic stenosis    UTI (urinary tract infection) 04/29/2022   Wounds, multiple     Social History   Socioeconomic History   Marital status: Single    Spouse name: Not on file   Number of children: Not on file   Years of education: Not on file  Highest education level: Not on file  Occupational History   Not on file  Tobacco Use   Smoking status: Never    Passive exposure: Never   Smokeless tobacco: Never  Vaping Use   Vaping status: Never Used  Substance and Sexual Activity   Alcohol use: No   Drug use: No   Sexual activity: Not Currently    Birth control/protection: Post-menopausal  Other Topics Concern   Not on file  Social History Narrative   Resides at [facility]   Social Determinants of Health   Financial Resource Strain: Not on file  Food Insecurity: No Food Insecurity (10/14/2022)   Hunger Vital Sign    Worried About Running Out of Food in the Last Year: Never true    Ran Out of Food in the Last Year: Never true  Transportation Needs: No Transportation Needs (10/14/2022)   PRAPARE - Administrator, Civil Service (Medical): No    Lack of Transportation  (Non-Medical): No  Physical Activity: Not on file  Stress: Not on file  Social Connections: Not on file  Intimate Partner Violence: Not At Risk (10/14/2022)   Humiliation, Afraid, Rape, and Kick questionnaire    Fear of Current or Ex-Partner: No    Emotionally Abused: No    Physically Abused: No    Sexually Abused: No    Past Surgical History:  Procedure Laterality Date   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   CYSTOSCOPY W/ URETERAL STENT PLACEMENT  08/25/2021   Procedure: CYSTOSCOPY WITH RETROGRADE PYELOGRAM/URETERAL STENT PLACEMENT;  Surgeon: Sondra Come, MD;  Location: [facility];  Service: Urology;;   CYSTOSCOPY/URETEROSCOPY/HOLMIUM LASER/STENT PLACEMENT Bilateral 10/21/2021   Procedure: CYSTOSCOPY/URETEROSCOPY/HOLMIUM LASER/STENT PLACEMENT;  Surgeon: Sondra Come, MD;  Location: [facility];  Service: Urology;  Laterality: Bilateral;   none     PACEMAKER INSERTION      Family History  Problem Relation Age of Onset   Cancer Mother    Heart failure Father     Allergies  Allergen Reactions   Tetanus Toxoid, Adsorbed Swelling   Tetanus Toxoids Swelling   Lisinopril Rash    Other reaction(s): Unknown   Niacin Rash    Other reaction(s): Unknown   Sitagliptin Rash    Other reaction(s): Unknown   Sulfamethoxazole-Trimethoprim Rash    Other reaction(s): Unknown       Latest Ref Rng & Units 10/16/2022    4:39 AM 10/15/2022    4:09 AM 10/14/2022    1:39 PM  CBC  WBC 4.0 - 10.5 K/uL 7.4  6.9  6.8   Hemoglobin 12.0 - 15.0 g/dL 02.5  42.7  06.2   Hematocrit 36.0 - 46.0 % 43.0  42.6  44.5   Platelets 150 - 400 K/uL 157  164  162       CMP     Component Value Date/Time   NA 136 10/18/2022 0425   K 3.6 10/18/2022 0425   CL 101 10/18/2022 0425   CO2 24 10/18/2022 0425   GLUCOSE 166 (H) 10/18/2022 0425   BUN 33 (H) 10/18/2022 0425   CREATININE 1.28 (H) 10/18/2022 0425   CALCIUM 8.6 (L) 10/18/2022 0425   PROT 6.1 (L) 10/17/2022 0459   ALBUMIN 3.0 (L) 10/17/2022 0459    AST 35 10/17/2022 0459   ALT 46 (H) 10/17/2022 0459   ALKPHOS 59 10/17/2022 0459   BILITOT 0.7 10/17/2022 0459   GFRNONAA 45 (L) 10/18/2022 0425     No  results found.     Assessment & Plan:   1. Rash and nonspecific skin eruption She has a rash on her feet and she notes that there is some that go up her thighs to her abdomen although it is much fainter in that area.  She does note that it itches somewhat and the wounds on the lower leg appear to be more so consistent with petechiae.  There is some concern for possible vasculitis as a cause.  Will send patient to dermatology for further evaluation. - Ambulatory referral to Dermatology  2. 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  Currently her swelling is actually very well-controlled and so I would not recommend return to the wraps at this time.  I we will be more beneficial for her to utilize medical grade compression stockings daily.  She should also elevate her lower extremities when possible at the facility and continue working with physical therapy as possible.  3. Primary hypertension Continue antihypertensive medications as already ordered, these medications have been reviewed and there are no changes at this time.  4. Discoloration of skin of toe I suspect that the patient's discoloration of her foot as well as some of the cold sensation has to be more so with her cardiac function versus vascular abnormality.  There was a bit of feel trace pulses.  However out of an abundance of caution and in accordance with patient's wishes we will have her return for ABIs.   Current Outpatient Medications on File Prior to Visit  Medication Sig Dispense Refill   acetaminophen (TYLENOL) 325 MG tablet Take 2 tablets (650 mg total) by mouth every 6 (six) hours as needed for mild pain (or Fever >/= 101). (Patient taking differently: Take 650-975 mg by mouth See admin  instructions. Take 975 mg at bedtime, may take 650 mg every 6 hours as needed for pain)     apixaban (ELIQUIS) 5 MG TABS tablet Take 1 tablet (5 mg total) by mouth 2 (two) times daily. 60 tablet 1   atorvastatin (LIPITOR) 40 MG tablet Take 1 tablet (40 mg total) by mouth daily at 6 PM. 30 tablet 0   Cholecalciferol 50 MCG (2000 UT) CAPS Take 1 capsule (2,000 Units total) by mouth daily. 30 capsule 1   diphenhydrAMINE (BENADRYL) 25 mg capsule Take 25 mg by mouth daily.     docusate sodium (COLACE) 100 MG capsule Take 1 capsule (100 mg total) by mouth 2 (two) times daily as needed for mild constipation. 10 capsule 0   Dulaglutide (TRULICITY) 0.75 MG/0.5ML SOAJ Inject 0.75 mg into the skin every Saturday.     fenofibrate (TRICOR) 145 MG tablet Take 1 tablet (145 mg total) by mouth daily. 30 tablet 1   furosemide (LASIX) 20 MG tablet Take 20 mg by mouth daily.     gabapentin (NEURONTIN) 100 MG capsule Take 100 mg by mouth daily.     insulin glargine (LANTUS) 100 UNIT/ML injection Inject 40 Units into the skin daily.     insulin lispro (HUMALOG) 100 UNIT/ML injection Inject 2 Units into the skin daily before supper. Sliding Scale     Menthol-Zinc Oxide (CALMOSEPTINE) 0.44-20.6 % OINT Apply topically 3 (three) times daily.     Multiple Vitamins-Minerals (CERTAVITE SENIOR PO) Take by mouth.     MYRBETRIQ 50 MG TB24 tablet Take 50 mg by mouth daily.     LEVEMIR FLEXPEN 100 UNIT/ML FlexPen Inject 35 Units into the skin  daily. (Patient not taking: Reported on 05/29/2023) 15 mL 11   nystatin (MYCOSTATIN/NYSTOP) powder SMARTSIG:1 Application Topical 2-3 Times Daily (Patient not taking: Reported on 05/29/2023)     omeprazole (PRILOSEC) 20 MG capsule Take 20 mg by mouth daily. (Patient not taking: Reported on 05/29/2023)     No current facility-administered medications on file prior to visit.    There are no Patient Instructions on file for this visit. No follow-ups on file.   Georgiana Spinner, NP

## 2023-06-14 ENCOUNTER — Encounter (INDEPENDENT_AMBULATORY_CARE_PROVIDER_SITE_OTHER): Payer: Self-pay | Admitting: Vascular Surgery

## 2023-07-12 ENCOUNTER — Other Ambulatory Visit (INDEPENDENT_AMBULATORY_CARE_PROVIDER_SITE_OTHER): Payer: Self-pay | Admitting: Nurse Practitioner

## 2023-07-12 DIAGNOSIS — L819 Disorder of pigmentation, unspecified: Secondary | ICD-10-CM

## 2023-07-17 ENCOUNTER — Ambulatory Visit (INDEPENDENT_AMBULATORY_CARE_PROVIDER_SITE_OTHER): Payer: 59 | Admitting: Vascular Surgery

## 2023-07-17 ENCOUNTER — Ambulatory Visit (INDEPENDENT_AMBULATORY_CARE_PROVIDER_SITE_OTHER): Payer: Medicare Other

## 2023-07-17 DIAGNOSIS — L819 Disorder of pigmentation, unspecified: Secondary | ICD-10-CM

## 2023-11-13 ENCOUNTER — Encounter (INDEPENDENT_AMBULATORY_CARE_PROVIDER_SITE_OTHER): Payer: Self-pay
